# Patient Record
Sex: Female | Born: 1945 | Race: White | Hispanic: No | Marital: Married | Smoking: Former smoker
Health system: Southern US, Community
[De-identification: ages and names within clinical notes are randomized; demographics above are authoritative.]

## PROBLEM LIST (undated history)

## (undated) DIAGNOSIS — N189 Chronic kidney disease, unspecified: Secondary | ICD-10-CM

## (undated) DIAGNOSIS — M75102 Unspecified rotator cuff tear or rupture of left shoulder, not specified as traumatic: Secondary | ICD-10-CM

## (undated) DIAGNOSIS — M7542 Impingement syndrome of left shoulder: Secondary | ICD-10-CM

## (undated) DIAGNOSIS — M25812 Other specified joint disorders, left shoulder: Secondary | ICD-10-CM

## (undated) DIAGNOSIS — I214 Non-ST elevation (NSTEMI) myocardial infarction: Secondary | ICD-10-CM

## (undated) DIAGNOSIS — M1990 Unspecified osteoarthritis, unspecified site: Secondary | ICD-10-CM

## (undated) DIAGNOSIS — I4892 Unspecified atrial flutter: Secondary | ICD-10-CM

## (undated) DIAGNOSIS — J329 Chronic sinusitis, unspecified: Secondary | ICD-10-CM

## (undated) DIAGNOSIS — Z7901 Long term (current) use of anticoagulants: Secondary | ICD-10-CM

## (undated) DIAGNOSIS — E042 Nontoxic multinodular goiter: Secondary | ICD-10-CM

## (undated) DIAGNOSIS — Z95818 Presence of other cardiac implants and grafts: Secondary | ICD-10-CM

## (undated) DIAGNOSIS — E785 Hyperlipidemia, unspecified: Secondary | ICD-10-CM

## (undated) DIAGNOSIS — K649 Unspecified hemorrhoids: Secondary | ICD-10-CM

## (undated) DIAGNOSIS — I5030 Unspecified diastolic (congestive) heart failure: Secondary | ICD-10-CM

## (undated) DIAGNOSIS — E119 Type 2 diabetes mellitus without complications: Secondary | ICD-10-CM

## (undated) DIAGNOSIS — I499 Cardiac arrhythmia, unspecified: Secondary | ICD-10-CM

## (undated) DIAGNOSIS — Z8719 Personal history of other diseases of the digestive system: Secondary | ICD-10-CM

## (undated) DIAGNOSIS — K219 Gastro-esophageal reflux disease without esophagitis: Secondary | ICD-10-CM

## (undated) DIAGNOSIS — R002 Palpitations: Secondary | ICD-10-CM

## (undated) DIAGNOSIS — I10 Essential (primary) hypertension: Secondary | ICD-10-CM

## (undated) DIAGNOSIS — I480 Paroxysmal atrial fibrillation: Secondary | ICD-10-CM

## (undated) HISTORY — DX: Palpitations: R00.2

## (undated) HISTORY — DX: Unspecified atrial flutter: I48.92

## (undated) HISTORY — PX: CARDIAC CATHETERIZATION: SHX172

## (undated) HISTORY — DX: Gastro-esophageal reflux disease without esophagitis: K21.9

## (undated) HISTORY — DX: Unspecified osteoarthritis, unspecified site: M19.90

## (undated) HISTORY — DX: Nontoxic multinodular goiter: E04.2

## (undated) HISTORY — DX: Paroxysmal atrial fibrillation: I48.0

## (undated) HISTORY — PX: TONSILLECTOMY: SUR1361

## (undated) HISTORY — DX: Essential (primary) hypertension: I10

## (undated) HISTORY — DX: Hyperlipidemia, unspecified: E78.5

## (undated) HISTORY — PX: BIOPSY THYROID: PRO38

## (undated) HISTORY — DX: Unspecified diastolic (congestive) heart failure: I50.30

---
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
## 2001-05-18 ENCOUNTER — Ambulatory Visit (HOSPITAL_COMMUNITY): Admission: RE | Admit: 2001-05-18 | Discharge: 2001-05-18 | Payer: Self-pay | Admitting: Pulmonary Disease

## 2002-12-20 ENCOUNTER — Encounter (HOSPITAL_COMMUNITY): Admission: RE | Admit: 2002-12-20 | Discharge: 2003-01-19 | Payer: Self-pay | Admitting: Pulmonary Disease

## 2004-04-02 ENCOUNTER — Ambulatory Visit (HOSPITAL_COMMUNITY): Admission: RE | Admit: 2004-04-02 | Discharge: 2004-04-02 | Payer: Self-pay | Admitting: Pulmonary Disease

## 2004-08-10 ENCOUNTER — Ambulatory Visit (HOSPITAL_COMMUNITY): Admission: RE | Admit: 2004-08-10 | Discharge: 2004-08-10 | Payer: Self-pay | Admitting: Pulmonary Disease

## 2004-08-24 ENCOUNTER — Ambulatory Visit (HOSPITAL_COMMUNITY): Admission: RE | Admit: 2004-08-24 | Discharge: 2004-08-24 | Payer: Self-pay | Admitting: Pulmonary Disease

## 2005-07-24 ENCOUNTER — Ambulatory Visit (HOSPITAL_COMMUNITY): Admission: RE | Admit: 2005-07-24 | Discharge: 2005-07-24 | Payer: Self-pay | Admitting: Pulmonary Disease

## 2005-10-17 ENCOUNTER — Ambulatory Visit: Payer: Self-pay | Admitting: *Deleted

## 2005-10-18 ENCOUNTER — Encounter (INDEPENDENT_AMBULATORY_CARE_PROVIDER_SITE_OTHER): Payer: Self-pay | Admitting: *Deleted

## 2005-10-18 ENCOUNTER — Ambulatory Visit: Payer: Self-pay | Admitting: Endocrinology

## 2005-10-18 ENCOUNTER — Other Ambulatory Visit: Admission: RE | Admit: 2005-10-18 | Discharge: 2005-10-18 | Payer: Self-pay | Admitting: Endocrinology

## 2005-10-25 ENCOUNTER — Encounter (HOSPITAL_COMMUNITY): Admission: RE | Admit: 2005-10-25 | Discharge: 2005-11-24 | Payer: Self-pay | Admitting: *Deleted

## 2005-10-25 ENCOUNTER — Ambulatory Visit: Payer: Self-pay | Admitting: Cardiology

## 2005-10-30 ENCOUNTER — Ambulatory Visit: Payer: Self-pay | Admitting: *Deleted

## 2007-01-07 ENCOUNTER — Emergency Department (HOSPITAL_COMMUNITY): Admission: EM | Admit: 2007-01-07 | Discharge: 2007-01-08 | Payer: Self-pay | Admitting: Emergency Medicine

## 2007-01-09 ENCOUNTER — Ambulatory Visit: Payer: Self-pay | Admitting: Cardiology

## 2007-01-09 ENCOUNTER — Ambulatory Visit (HOSPITAL_COMMUNITY): Admission: RE | Admit: 2007-01-09 | Discharge: 2007-01-09 | Payer: Self-pay | Admitting: Pulmonary Disease

## 2007-01-21 ENCOUNTER — Ambulatory Visit: Payer: Self-pay | Admitting: Cardiovascular Disease

## 2007-03-10 ENCOUNTER — Encounter: Payer: Self-pay | Admitting: Endocrinology

## 2007-03-10 DIAGNOSIS — K219 Gastro-esophageal reflux disease without esophagitis: Secondary | ICD-10-CM

## 2007-03-10 DIAGNOSIS — I10 Essential (primary) hypertension: Secondary | ICD-10-CM | POA: Insufficient documentation

## 2009-02-14 ENCOUNTER — Ambulatory Visit (HOSPITAL_COMMUNITY): Admission: RE | Admit: 2009-02-14 | Discharge: 2009-02-14 | Payer: Self-pay | Admitting: Pulmonary Disease

## 2009-03-08 ENCOUNTER — Ambulatory Visit (HOSPITAL_COMMUNITY): Admission: RE | Admit: 2009-03-08 | Discharge: 2009-03-08 | Payer: Self-pay | Admitting: Pulmonary Disease

## 2009-03-20 ENCOUNTER — Ambulatory Visit: Payer: Self-pay | Admitting: Cardiology

## 2009-03-20 DIAGNOSIS — Z8679 Personal history of other diseases of the circulatory system: Secondary | ICD-10-CM | POA: Insufficient documentation

## 2009-03-20 DIAGNOSIS — R002 Palpitations: Secondary | ICD-10-CM | POA: Insufficient documentation

## 2009-03-23 ENCOUNTER — Ambulatory Visit: Payer: Self-pay | Admitting: Cardiology

## 2009-03-23 ENCOUNTER — Encounter (HOSPITAL_COMMUNITY): Admission: RE | Admit: 2009-03-23 | Discharge: 2009-03-30 | Payer: Self-pay | Admitting: Cardiology

## 2009-03-28 ENCOUNTER — Encounter (INDEPENDENT_AMBULATORY_CARE_PROVIDER_SITE_OTHER): Payer: Self-pay | Admitting: *Deleted

## 2009-04-10 ENCOUNTER — Telehealth: Payer: Self-pay | Admitting: Cardiology

## 2009-10-03 ENCOUNTER — Ambulatory Visit (HOSPITAL_COMMUNITY): Admission: RE | Admit: 2009-10-03 | Discharge: 2009-10-03 | Payer: Self-pay | Admitting: Pulmonary Disease

## 2009-12-18 ENCOUNTER — Ambulatory Visit (HOSPITAL_COMMUNITY): Admission: RE | Admit: 2009-12-18 | Discharge: 2009-12-18 | Payer: Self-pay | Admitting: Pulmonary Disease

## 2010-03-12 ENCOUNTER — Ambulatory Visit (HOSPITAL_COMMUNITY): Admission: RE | Admit: 2010-03-12 | Discharge: 2010-03-12 | Payer: Self-pay | Admitting: Pulmonary Disease

## 2010-06-18 ENCOUNTER — Ambulatory Visit (HOSPITAL_COMMUNITY)
Admission: RE | Admit: 2010-06-18 | Discharge: 2010-06-18 | Payer: Self-pay | Source: Home / Self Care | Attending: Pulmonary Disease | Admitting: Pulmonary Disease

## 2010-06-29 ENCOUNTER — Ambulatory Visit (HOSPITAL_COMMUNITY): Admission: RE | Admit: 2010-06-29 | Payer: Self-pay | Source: Home / Self Care | Admitting: Radiology

## 2010-06-29 ENCOUNTER — Ambulatory Visit (HOSPITAL_COMMUNITY)
Admission: RE | Admit: 2010-06-29 | Discharge: 2010-06-29 | Payer: Self-pay | Source: Home / Self Care | Attending: Pulmonary Disease | Admitting: Pulmonary Disease

## 2010-07-22 ENCOUNTER — Encounter: Payer: Self-pay | Admitting: Endocrinology

## 2010-07-23 ENCOUNTER — Encounter: Payer: Self-pay | Admitting: Pulmonary Disease

## 2010-08-30 ENCOUNTER — Ambulatory Visit: Payer: Self-pay | Admitting: Cardiology

## 2010-09-03 ENCOUNTER — Encounter: Payer: Self-pay | Admitting: Cardiology

## 2010-09-03 ENCOUNTER — Ambulatory Visit (INDEPENDENT_AMBULATORY_CARE_PROVIDER_SITE_OTHER): Payer: MEDICARE | Admitting: Cardiology

## 2010-09-03 DIAGNOSIS — I4891 Unspecified atrial fibrillation: Secondary | ICD-10-CM

## 2010-09-03 DIAGNOSIS — R002 Palpitations: Secondary | ICD-10-CM

## 2010-09-03 DIAGNOSIS — E042 Nontoxic multinodular goiter: Secondary | ICD-10-CM | POA: Insufficient documentation

## 2010-09-03 DIAGNOSIS — I480 Paroxysmal atrial fibrillation: Secondary | ICD-10-CM | POA: Insufficient documentation

## 2010-09-03 DIAGNOSIS — I10 Essential (primary) hypertension: Secondary | ICD-10-CM

## 2010-09-04 ENCOUNTER — Encounter: Payer: Self-pay | Admitting: Cardiology

## 2010-09-05 ENCOUNTER — Ambulatory Visit (HOSPITAL_COMMUNITY)
Admission: RE | Admit: 2010-09-05 | Discharge: 2010-09-05 | Disposition: A | Discharge status: Home / Self Care | Payer: Medicare Other | Source: Ambulatory Visit | Attending: Cardiology | Admitting: Cardiology

## 2010-09-05 ENCOUNTER — Encounter: Payer: Self-pay | Admitting: Cardiology

## 2010-09-05 DIAGNOSIS — I10 Essential (primary) hypertension: Secondary | ICD-10-CM | POA: Insufficient documentation

## 2010-09-05 DIAGNOSIS — R002 Palpitations: Secondary | ICD-10-CM | POA: Insufficient documentation

## 2010-09-05 DIAGNOSIS — I517 Cardiomegaly: Secondary | ICD-10-CM

## 2010-09-11 NOTE — Assessment & Plan Note (Signed)
Summary: np6 palp dr hawkins/tmj. CALLED FOR [facility] 08/31/10/TMJ   Visit Type:  Follow-up Primary Provider:  Dr. Kari Baars   History of Present Illness: 65 year old woman presents for cardiology followup. This is my first meeting with her. She was seen by Dr. Daleen Squibb in September 2010. Most recent cardiac testing is outlined below, including a reassuring Myoview from September 2010.  Record review also finds a prior episode of atrial fibrillation with rapid ventricular response back in 2008, spontaneously converted to sinus rhythm with diltiazem. She has a history of intermittent palpitations.  She is referred back to the office with a history of palpitations that occurred recently, awoke her at 4 AM, and described as a "fast" heartbeat. This lasted for approximately 30 minutes. No associated chest pain. At the same time she has been having trouble with her back, and having pain associated with it.  The last time she can recall having a similar episode was approximately 2 years ago. She reports no exertional chest pain. Her CHADS2 scores approximately 2.  Preventive Screening-Counseling & Management  Alcohol-Tobacco     Smoking Status: quit  Current Medications (verified): 1)  Hydrochlorothiazide 25 Mg  Tabs (Hydrochlorothiazide) .... Take 1 By Mouth Qd 2)  Lisinopril 10 Mg  Tabs (Lisinopril) .... Take 1 By Mouth Qd 3)  Prilosec 20 Mg  Cpdr (Omeprazole) .... Take 1 By Mouth Qd 4)  Simvastatin 40 Mg Tabs (Simvastatin) .... Take 1 Tab Daily 5)  Hydrocodone-Acetaminophen 7.5-500 Mg Tabs (Hydrocodone-Acetaminophen) .... Take As Neede For Pain 6)  Cvs Cinnamon 500 Mg Caps (Cinnamon) .... Take 2 Tabs Two Times A Day 7)  Fish Oil 1000 Mg Caps (Omega-3 Fatty Acids) .... Take 1 Tab Two Times A Day 8)  Aspir-Low 81 Mg Tbec (Aspirin) .... Take 1 Tab Daily  Allergies (verified): 1)  ! Sulfa 2)  ! * Ceflosporin  Comments:  Nurse/Medical Assistant: no meds no list we reviewed from meds from last  visit in 2010 [facility] [place]  Past History:  Family History: Last updated: 09/03/2010 Father: medical history not known Mother: history of stroke, died age 50  Social History: Last updated: 09/03/2010 Full Time - trucking business Married  Regular Exercise - no Drug Use - no Tobacco Use - Former, quit June 1995 Alcohol Use - no  Past Medical History: GERD Hypertension Dyslipidemia Multinodular Goiter Palpitations Atrial Fibrillation - 2008, spontaneously converted  Past Surgical History: Thyroid Biopsy  Tonsillectomy  Family History: Father: medical history not known Mother: history of stroke, died age 17  Social History: Full Time - trucking business Married  Regular Exercise - no Drug Use - no Tobacco Use - Former, quit June 1995 Alcohol Use - no Smoking Status:  quit  Review of Systems  The patient denies anorexia, fever, weight loss, chest pain, syncope, dyspnea on exertion, peripheral edema, prolonged cough, hemoptysis, melena, hematochezia, and severe indigestion/heartburn.         Chronic problems with arthritic pain, back pain. Otherwise reviewed and negative.  Vital Signs:  Patient profile:   65 year old female Weight:      177 pounds BMI:     29.56 Pulse rate:   61 / minute BP sitting:   144 / 82  (left arm)  Vitals Entered By: Dreama Saa, CNA (September 03, 2010 9:35 AM)  Physical Exam  Additional Exam:  Overweight woman in no acute distress. HEENT: Conjunctiva and lids normal, oropharynx with moist mucosa. Neck: Supple, no elevated JVP or carotid bruits.  Thyromegaly noted. Lungs: Clear to auscultation, nonlabored. Cardiac: Regular rate and rhythm, soft systolic murmur at the base, no pericardial rub. Abdomen: Soft, nontender, bowel sounds present. Skin: Warm and dry. Musculoskeletal: No kyphosis. Extremities: No pitting edema. Neuropsychiatric: Alert and oriented x3, affect appropriate.   Echocardiogram  Procedure date:   01/09/2007  Findings:       M-mode:  Aorta 2.7, left atrium 4.2, septum 1.6, posterior wall 1.2, LV   diastole 4.0, LV systole 2.5.   1. Technically, adequate echocardiographic study.   2. Left atrial size at the upper limit of normal; normal right atrium       and right ventricle.   3. Mild aortic valvular sclerosis with normal function.   4. Normal mitral valve; mild annular calcification; trivial       regurgitation.   5. Normal tricuspid valve, pulmonic valve and proximal pulmonary       artery.   6. Normal proximal ascending aorta.   7. Normal left ventricular size; mild septal hypertrophy; normal       regional and global function.   8. Normal IVC.  Nuclear Study  Procedure date:  03/23/2009  Findings:      Scintigraphic Data: Acquisition notable for very mild breast and   diaphragmatic attenuation.  Left ventricular size was normal.  On   tomographic images reconstructed in standard planes, there was   uniform and normal uptake of tracer all myocardial segments.  The   rest images were unchanged.  The gated reconstruction demonstrated   normal regional and global LV systolic function as well as normal   systolic accentuation of activity throughout.  Estimated ejection   fraction was 63%.    IMPRESSION:   Negative stress nuclear myocardial study revealing somewhat   impaired exercise capacity, no significant stress-induced EKG   abnormalities, normal left ventricular size, normal left   ventricular systolic function and normal myocardial perfusion.   Other findings as noted.  EKG  Procedure date:  09/03/2010  Findings:      Sinus bradycardia at 52 beats per minute.  Impression & Recommendations:  Problem # 1:  ATRIAL FIBRILLATION (ICD-427.31)  Based on record review, documented back in 2008. Recurrence pattern over time is not entirely clear, however this seems to be very infrequent. She has a CHADS2 score of approximately 2 based on age and hypertension. I  would like to obtain a seven-day cardiac monitor to get a better sense of her true heart rate variability since she is so bradycardic at baseline, and also ensure that she is not having asymptomatic episodes of atrial fibrillation. This will help [place] to further define a plan for anticoagulation going forward. We did discuss Coumadin today. A 2-D echocardiogram will also be obtained to reassess cardiac structure and function. Plan to bring her back the office to discuss the situation further.  Her updated medication list for this problem includes:    Aspir-low 81 Mg Tbec (Aspirin) .Marland Kitchen... Take 1 tab daily  Problem # 2:  PALPITATIONS (ICD-785.1)  Presumably atrial fibrillation, although not clearly documented at all times. Previous ischemic workup has been reassuring, including a Myoview from September 2010. No active chest pain symptoms.  Her updated medication list for this problem includes:    Lisinopril 10 Mg Tabs (Lisinopril) .Marland Kitchen... Take 1 by mouth qd    Aspir-low 81 Mg Tbec (Aspirin) .Marland Kitchen... Take 1 tab daily  Orders: Cardionet/Event Monitor (Cardionet/Event)  Problem # 3:  GOITER, MULTINODULAR (ICD-241.1)  Now followed by endocrinology.  Problem # 4:  HYPERTENSION (ICD-401.9)  No changes made to present regimen.  Her updated medication list for this problem includes:    Hydrochlorothiazide 25 Mg Tabs (Hydrochlorothiazide) .Marland Kitchen... Take 1 by mouth qd    Lisinopril 10 Mg Tabs (Lisinopril) .Marland Kitchen... Take 1 by mouth qd    Aspir-low 81 Mg Tbec (Aspirin) .Marland Kitchen... Take 1 tab daily  Orders: Cardionet/Event Monitor (Cardionet/Event)  Other Orders: 2-D Echocardiogram (2D Echo)  Patient Instructions: 1)  Your physician recommends that you schedule a follow-up appointment in: 1 month 2)  Your physician recommends that you continue on your current medications as directed. Please refer to the Current Medication list given to you today. 3)  Your physician has requested that you have an echocardiogram.   Echocardiography is a painless test that uses sound waves to create images of your heart. It provides your doctor with information about the size and shape of your heart and how well your heart's chambers and valves are working.  This procedure takes approximately one hour. There are no restrictions for this procedure. 4)  Your physician has recommended that you wear an event monitor.  Event monitors are medical devices that record the heart's electrical activity. Doctors most often use these monitors to diagnose arrhythmias. Arrhythmias are problems with the speed or rhythm of the heartbeat. The monitor is a small, portable device. You can wear one while you do your normal daily activities. This is usually used to diagnose what is causing palpitations/syncope (passing out).

## 2010-09-11 NOTE — Letter (Signed)
Summary: [place] [employer], agricultural at [facility]  [address], [place] [address]   Phone: [phone]  Fax: [phone]      September 05, 2010 MRN: [id number]   [facility] [address] [place], [place]  [address]   Dear Ms. Sillas,  Your test ordered by Selena Batten has been reviewed by your physician (or physician assistant) and was found to be normal or stable. Your physician (or physician assistant) felt no changes were needed at this time.  __X__ Echocardiogram  ____ Cardiac Stress Test  ____ Lab Work  ____ Peripheral vascular study of arms, legs or neck  ____ CT scan or X-ray  ____ Lung or Breathing test  ____ Other:  Please continue on current medical treatment.  Thank you.   Nona Dell, MD, F.A.C.C

## 2010-10-11 ENCOUNTER — Ambulatory Visit: Payer: MEDICARE | Admitting: Cardiology

## 2010-11-13 NOTE — Procedures (Signed)
NAMECAMELLE, HENKELS NO.:  [id number]   MEDICAL RECORD NO.:  [id number]          PATIENT TYPE:  OUT   LOCATION:  RAD                           FACILITY:  [facility]   PHYSICIAN:  Gerrit Friends. Dietrich Pates, MD, FACCDATE OF BIRTH:  [birth year]   DATE OF PROCEDURE:  01/09/2007  DATE OF DISCHARGE:                                ECHOCARDIOGRAM   CLINICAL DATA:  This is a 65 year old woman with atrial fibrillation.   M-mode:  Aorta 2.7, left atrium 4.2, septum 1.6, posterior wall 1.2, LV  diastole 4.0, LV systole 2.5.  1. Technically, adequate echocardiographic study.  2. Left atrial size at the upper limit of normal; normal right atrium      and right ventricle.  3. Mild aortic valvular sclerosis with normal function.  4. Normal mitral valve; mild annular calcification; trivial      regurgitation.  5. Normal tricuspid valve, pulmonic valve and proximal pulmonary      artery.  6. Normal proximal ascending aorta.  7. Normal left ventricular size; mild septal hypertrophy; normal      regional and global function.  8. Normal IVC.      Gerrit Friends. Dietrich Pates, MD, [facility]  Electronically Signed     RMR/MEDQ  D:  01/09/2007  T:  01/10/2007  Job:  [id number]

## 2010-11-13 NOTE — Assessment & Plan Note (Signed)
[facility]                       [place] CARDIOLOGY OFFICE NOTE   FE, OKUBO                        MRN:          [id number]  DATE:03/20/2009                            DOB:          Jun 13, 1946    HISTORY OF PRESENT ILLNESS:  I was asked by Dr. Kari Baars to  consult on Erika Lee, 65 year old single white female with dyspnea on  exertion and multiple cardiac risk factors.   She was evaluated here in 1997 with tachycardia and palpitations.  A  stress Myoview at that time demonstrated poor exercise tolerance,  exaggerated heart rate response to exercise, but no ischemia.   She has multiple risk factors including remote tobacco, age, sedentary  lifestyle, mild obesity, mixed hyperlipidemia, hypertension.   She currently denies any chest pain or angina.  She has no orthopnea,  PND, or peripheral edema.  She does have significant dyspnea on  exertion.  She quit smoking in 1995.   PAST MEDICAL HISTORY:  Her past medical history is significant for being  intolerant of doxycycline.   CURRENT MEDICATIONS:  1. Hydrochlorothiazide 25 mg per day.  2. Lisinopril 10 mg per day.  3. Prilosec 20 mg per day.  4. Simvastatin 40 mg a day.  5. Hydrocodone and acetaminophen as needed.  6. Cinnamon 2 tablets a day.  7. Fish oil 1000 mg two times a day.  8. Aspirin 81 mg per day.   ALLERGIES:  She also list allergies for SULFA and CEPHALOSPORIN.   SOCIAL HISTORY:  She is single.  She has 2 children.  She does not drink  alcohol.  She does not routinely exercise.   FAMILY HISTORY:  Unremarkable for premature coronary disease in first-  degree relatives.   REVIEW OF SYSTEMS:  Negative other than HPI.  All systems were  questioned.   PHYSICAL EXAMINATION:  VITAL SIGNS:  Blood pressure today is 129/82, her  pulse is 51, and she is in sinus brady.  EKG otherwise normal.  She is  65 inches, weighs 168 pounds.  BMI is 28.06.  GENERAL:  She is in  no acute distress.  She looks older than stated age.  HEENT:  Normal except sclerae are injected.  NECK:  Supple.  Carotid upstrokes were equal bilaterally without bruits.  Thyroid is palpable with symmetrical thyromegaly, but no tenderness or  bruit.  CHEST:  Lungs are clear to auscultation and percussion.  No rhonchi or  wheezes.  HEART:  A nondisplaced PMI, normal S1 and S2.  No murmur, rub, or  gallop.  ABDOMEN:  Soft, good bowel sounds.  No midline bruit.  No hepatomegaly.  EXTREMITIES:  No cyanosis, clubbing, or edema.  Pulses were brisk in  both dorsalis pedis and posterior tibial.  No sign of DVT.  NEUROLOGIC:  Intact.  SKIN:  Unremarkable.   ASSESSMENT:  Dyspnea on exertion with multiple cardiac risk factors,  rule out obstructive coronary artery disease.  I have explained this to  her today using heart model and coronary artery model and why we want to  rule out obstructive coronary disease.  If her  stress test is negative  with good LV function, we will continue with secondary prevention  strategies as appropriately being done by the patient and Dr. Juanetta Gosling.  If that is the case, I will see her back p.r.n.   I did advise her to try to walk 3 hours per week if possible.     Thomas C. Daleen Squibb, MD, [facility]  Electronically Signed    TCW/MedQ  DD: 03/20/2009  DT: 03/21/2009  Job #: [id number]   cc:   Ramon Dredge L. Juanetta Gosling, M.D.

## 2010-11-13 NOTE — Assessment & Plan Note (Signed)
[facility]                       [place] CARDIOLOGY OFFICE NOTE   Erika Lee, Erika Lee                        MRN:          [id number]  DATE:01/21/2007                            DOB:          03-10-1946    Erika Lee is seen today at the request of Dr. Juanetta Gosling.  She was  recently in the emergency room for palpitations.  She was found to be in  atrial fibrillation with a rapid ventricular response and we were asked  to evaluate this, both by Dr. Effie Shy and Dr. Juanetta Gosling.   The patient has a history of hypertension and atypical chest pain.   Her blood pressure has been under reasonable control.  She had a normal  nuclear study back in April 2007.   At that time, the ejection fraction was 60%.  Aside from her  hypertension and atypical chest pain, she has hyperlipidemia.  There is  no family history of coronary disease and she is a nonsmoker.   The patient tends to run a high sugar.  She does not think she has  diabetes; however, she takes cinnamon capsules to lower her sugar; she  seems to think that they are labile.   She has not been placed on any oral hypoglycemics.  The patient has had  occasional flip-flops for a long time, in particular at night when she  goes to bed she feels her heart skipping beats; however, these are  benign.  They are not associated with any symptoms and they do not  bother her too much.   However, when she went to the emergency room on July 11, she had rapid  palpitations with severe chest pain and shortness of breath; she said  she was quite symptomatic.  She was seen in the emergency room at around  8 o'clock.  She was given Cardizem.  She apparently converted  spontaneously and was discharged home by about 1 o'clock.  At the time,  her lab work was normal.  Her EKG did not show any acute changes.  Her  chest x-ray showed no active disease.  TSH and T4 were not drawn.   She subsequently had a followup echo on January 09, 2007 which showed  normal LV function with mild septal hypertrophy and no valve disease.   PMH: The patient does have a history of thyroid disease; she has had a  biopsy of it and appears to have a chronic nodule.  She said her TSH and  T4 were normal with Dr. Juanetta Gosling last year and she is due to have it  repeated.  She has not had any other stigmata of hyperthyroidism.   REVIEW OF SYSTEMS:  Otherwise negative.   CURRENT MEDICATIONS:  1. Lisinopril 10 mg a day.  2. Hydrochlorothiazide __________ a day.  3. Lipitor 40 mg a day.  4. Omeprazole 20 mg b.i.d.  5. Cinnamon capsules 1000 mg daily.   FAMILY HISTORY:  Noncontributory.   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  She  has  2 children.  Unfortunately, her daughter was killed by a gunshot  wound a few years back; this has been a source of depression and stress  for the patient.  Her son is doing well.  She does not smoke or drink.   ALLERGIES:  She has no known allergies.   PHYSICAL EXAMINATION:  Her exam is remarkable for a healthy-appearing  middle-aged white female in no acute distress.  Weight is 167.  She is afebrile.  Respiratory rate is 14.  Blood  pressure is 112/74.  Pulse is 54 and regular.  NECK:  Supple.  There is no JVP elevation and no bruits.  There is a  thyroid nodule, right greater than left.  There is no tenderness.  NECK:  Otherwise benign.  No lymphadenopathy.  LUNGS:  Clear with good diaphragmatic motion, no wheezing.  CARDIAC:  S1 and S2 with normal heart sounds.  PMI is normal.  ABDOMEN:  Benign.  Bowel sounds are positive.  No AAA.  No  hepatosplenomegaly and no hepatojugular reflux.  No tenderness.  Distal pulses are intact.  No edema.  NEUROLOGIC:  Nonfocal.  SKIN:  Warm and dry.  There is no muscular weakness.   Her baseline EKG is totally normal with sinus bradycardia, rate of 54.   IMPRESSION:   1. Isolated episode of paroxysmal atrial fibrillation.  At the time,      the patient apparently was on doxycycline and prednisone for a      spider biter and I am not sure if this is related to steroids, but      I have seen people have atrial fibrillation in the setting of      steroid treatment.  Regardless, I do not think the patient needs      additional medication or Coumadin at this time.  She will call me      if she has any recurrent palpitations and she will get a CardioNet      monitor and continue aspirin therapy for now.  2. History of thyroid abnormality.  She is to see Dr. Juanetta Gosling soon and      I asked her to get a TSH and T4 to make sure it remains normal, in      light of her atrial fibrillation.  There is no stigmata of      recurrent hypothyroidism or hyperthyroidism.  3. Hypertension, currently well-controlled.  Continue diuretic and      lisinopril.  4. Hyperlipidemia.  Continue Lipitor 40 mg a day.  Followup and lipid      and liver profile with Dr. Juanetta Gosling in August.  5. Reflux.  Continue omeprazole 20 mg b.i.d., low-spice diet.  6. Question of diabetes.  It may be worthwhile for the patient to have      a glucose tolerance test or a hemoglobin A1c.  For the time-being,      she will continue her cinnamon.  She said she read about this in a      book and feels that it helps keep her sugars from going up.  7. Anxiety, depression and grief particularly centered around the      murder of her daughter and raising of the grandson by an estranged      husband.  The patient will follow up with Dr. Juanetta Gosling.  She may      benefit from a prolonged course of Prozac.   I will see the patient back in 6 months.  She will  call us sooner if she  has any recurrent palpitations.  Again, I think the main issue here is  whether or not she should be on Coumadin; with a fairly normal  echocardiogram and a fairly young age with an isolated episode of atrial  fibrillation, I do not think  that this is warranted.  She will follow up  with Dr. Juanetta Gosling until her spider bite heals; currently, it seems to be  doing better.  I suspect it was a brown recluse spider bite, since it  has lasted so long and it was not painful when it happened.  The patient  has finished her course of doxycycline and is off her prednisone at this  time.     Noralyn Pick. Eden Emms, MD, [facility]  Electronically Signed    PCN/MedQ  DD: 01/21/2007  DT: 01/22/2007  Job #: [id number]

## 2010-11-16 NOTE — Procedures (Signed)
   NAME:  Erika Lee, Erika Lee NO.:  [id number]   MEDICAL RECORD NO.:  [id number]                   PATIENT TYPE:  PREC   LOCATION:                                       FACILITY:   PHYSICIAN:  Edward L. Juanetta Gosling, M.D.             DATE OF BIRTH:   DATE OF PROCEDURE:  12/20/2002  DATE OF DISCHARGE:                                    STRESS TEST   INDICATIONS FOR PROCEDURE:  [name] is undergoing graded exercise  testing because she has been having chest discomfort and leg swelling, and  she is undergoing stress test to be certain that she does not have ischemic  cardiac disease or congestive heart failure.  There are no contraindications  to graded exercise testing.   [name] exercised for four minutes 43 seconds on the Bruce protocol,  reaching and sustaining 7.0 METs.  She had Cardiolite injected per protocol.  Her maximum recorded heart rate was 138 which is 86% of her age-predicted  maximal heart rate.  She did not have any chest pain during exercise but she  did have shortness of breath and fatigue.  Blood pressure response to  exercise was normal and she had no other symptoms during exercise.  Electrocardiographically she showed some exaggeration of ST-T wave changes  inferiorly and laterally but no definite ischemic changes.   IMPRESSION:  1. Poor exercise tolerance.  2. Normal blood pressure response to exercise.  3. No definite evidence of inducible ischemia.  4. Shortness of breath and weakness related to exercise.   Cardiolite images are pending.                                               Edward L. Juanetta Gosling, M.D.    ELH/MEDQ  D:  12/20/2002  T:  12/20/2002  Job:  [id number]

## 2010-11-22 ENCOUNTER — Ambulatory Visit: Payer: Self-pay | Admitting: Cardiology

## 2010-12-05 DIAGNOSIS — R002 Palpitations: Secondary | ICD-10-CM

## 2010-12-13 ENCOUNTER — Encounter: Payer: Self-pay | Admitting: Cardiology

## 2010-12-13 ENCOUNTER — Ambulatory Visit (INDEPENDENT_AMBULATORY_CARE_PROVIDER_SITE_OTHER): Payer: Medicare Other | Admitting: Cardiology

## 2010-12-13 VITALS — BP 130/71 | HR 51 | Wt 173.0 lb

## 2010-12-13 DIAGNOSIS — R002 Palpitations: Secondary | ICD-10-CM

## 2010-12-13 DIAGNOSIS — I4891 Unspecified atrial fibrillation: Secondary | ICD-10-CM

## 2010-12-13 NOTE — Patient Instructions (Signed)
Your physician recommends that you continue on your current medications as directed. Please refer to the Current Medication list given to you today.  Your physician recommends that you schedule a follow-up appointment in: 6 months  

## 2010-12-13 NOTE — Progress Notes (Signed)
Clinical Summary Ms. Ju is a 65 y.o.female presenting for followup. I met her for the first time back in March. She states that she has been out of town for most of the time since our last visit, working in [place] with her husband. She did not wear the cardiac monitor ordered back in March, although obtained a [employer] monitor just recently, and completed a one-week observation. During this time she had sinus rhythm and sinus bradycardia with no significant arrhythmias. She reports no major problems with palpitations since our last visit, and is most comfortable of observation only at this time.  Followup echocardiogram is noted below.    Allergies  Allergen Reactions  . Sulfonamide Derivatives     Current outpatient prescriptions:aspirin 81 MG EC tablet, Take 81 mg by mouth daily.  , Disp: , Rfl: ;  Cinnamon 500 MG capsule, Take 1,000 mg by mouth 2 (two) times daily.  , Disp: , Rfl: ;  esomeprazole (NEXIUM) 40 MG capsule, Take 40 mg by mouth daily before breakfast.  , Disp: , Rfl: ;  hydrochlorothiazide 25 MG tablet, Take 25 mg by mouth daily.  , Disp: , Rfl:  HYDROcodone-acetaminophen (LORTAB) 7.5-500 MG per tablet, Take 1 tablet by mouth as needed. For pain , Disp: , Rfl: ;  lisinopril (PRINIVIL,ZESTRIL) 10 MG tablet, Take 10 mg by mouth daily.  , Disp: , Rfl: ;  Omega-3 Fatty Acids (FISH OIL) 1000 MG CAPS, Take 1,000 mg by mouth 2 (two) times daily.  , Disp: , Rfl: ;  Potassium 99 MG TABS, Take 1 tablet by mouth 2 (two) times daily.  , Disp: , Rfl:  simvastatin (ZOCOR) 40 MG tablet, Take 40 mg by mouth at bedtime.  , Disp: , Rfl: ;  Vitamins-Lipotropics (VIT BALANCED B-100 PO), Take 1 tablet by mouth daily.  , Disp: , Rfl: ;  DISCONTD: omeprazole (PRILOSEC) 20 MG capsule, Take 20 mg by mouth daily.  , Disp: , Rfl:   Past Medical History  Diagnosis Date  . Hyperlipidemia   . Multinodular goiter   . Atrial fibrillation     Documented 2008, spontaneously converted  . GERD  (gastroesophageal reflux disease)   . Essential hypertension, benign   . Palpitations     Social History Ms. Deeg reports that she quit smoking about 17 years ago. Her smoking use included Cigarettes. She has never used smokeless tobacco. Ms. Ciolek reports that she does not drink alcohol.  Review of Systems Otherwise negative.  Physical Examination Filed Vitals:   12/13/10 1328  BP: 130/71  Pulse: 51   Additional Exam:  Overweight woman in no acute distress.   HEENT: Conjunctiva and lids normal, oropharynx with moist mucosa.   Neck: Supple, no elevated JVP or carotid bruits. Thyromegaly noted.   Lungs: Clear to auscultation, nonlabored.   Cardiac: Regular rate and rhythm, soft systolic murmur at the base, no pericardial rub.   Abdomen: Soft, nontender, bowel sounds present.   Skin: Warm and dry.   Musculoskeletal: No kyphosis.   Extremities: No pitting edema.   Neuropsychiatric: Alert and oriented x3, affect appropriate.    Studies Echocardiogram 09/05/2010: - Left ventricle: The cavity size was normal. Wall thickness was     increased in a pattern of mild LVH. Systolic function was normal.     The estimated ejection fraction was in the range of 55% to 60%.     Wall motion was normal; there were no regional wall motion     abnormalities.   -  Left atrium: The atrium was mildly dilated.   - Atrial septum: No defect or patent foramen ovale was identified.  Problem List and Plan

## 2010-12-13 NOTE — Assessment & Plan Note (Signed)
Not recurrent since last visit. One week cardiac monitoring demonstrated no significant arrhythmias. Patient remains most comfortable with observation at this time. Certainly if her symptoms progress, we can attempt another period of monitoring to exclude any obvious dysrhythmia.

## 2010-12-13 NOTE — Assessment & Plan Note (Signed)
Documented in 2008, however no clear dysrhythmia since that time. For now she is staying on aspirin.

## 2010-12-14 ENCOUNTER — Ambulatory Visit: Payer: Self-pay | Admitting: Cardiology

## 2010-12-17 ENCOUNTER — Encounter: Payer: Self-pay | Admitting: Cardiology

## 2011-03-26 ENCOUNTER — Other Ambulatory Visit (HOSPITAL_COMMUNITY): Payer: Self-pay | Admitting: Pulmonary Disease

## 2011-03-26 DIAGNOSIS — Z139 Encounter for screening, unspecified: Secondary | ICD-10-CM

## 2011-04-04 ENCOUNTER — Ambulatory Visit (HOSPITAL_COMMUNITY)
Admission: RE | Admit: 2011-04-04 | Discharge: 2011-04-04 | Disposition: A | Discharge status: Home / Self Care | Payer: Medicare Other | Source: Ambulatory Visit | Attending: Pulmonary Disease | Admitting: Pulmonary Disease

## 2011-04-04 DIAGNOSIS — Z1231 Encounter for screening mammogram for malignant neoplasm of breast: Secondary | ICD-10-CM | POA: Insufficient documentation

## 2011-04-04 DIAGNOSIS — Z139 Encounter for screening, unspecified: Secondary | ICD-10-CM

## 2011-04-16 LAB — POCT CARDIAC MARKERS
Operator id: 217071
Troponin i, poc: <0.05

## 2011-04-16 LAB — COMPREHENSIVE METABOLIC PANEL
BUN: 21
CO2: 23
Calcium: 9.3
Creatinine, Ser: 0.9
GFR calc Af Amer: >60
GFR calc non Af Amer: >60
Glucose, Bld: 246 — ABNORMAL HIGH

## 2011-04-16 LAB — CBC
Hemoglobin: 14.5
MCHC: 34.3
MCV: 87.5
RBC: 4.84

## 2011-04-16 LAB — DIFFERENTIAL
Lymphocytes Relative: 23
Lymphs Abs: 2.5
Neutrophils Relative %: 70

## 2011-04-16 LAB — URINALYSIS, ROUTINE W REFLEX MICROSCOPIC
Bilirubin Urine: NEGATIVE
Ketones, ur: NEGATIVE
Nitrite: NEGATIVE
pH: 6

## 2011-04-16 LAB — APTT: aPTT: 27

## 2011-04-16 LAB — D-DIMER, QUANTITATIVE: D-Dimer, Quant: 0.22

## 2011-04-16 LAB — PROTIME-INR: Prothrombin Time: 12.6

## 2011-09-09 MED ORDER — SODIUM CHLORIDE 0.45 % IV SOLN
Freq: Once | INTRAVENOUS | Status: AC
  Administered 2011-09-10: 1000 mL via INTRAVENOUS

## 2011-09-10 ENCOUNTER — Encounter (HOSPITAL_COMMUNITY): Payer: Self-pay | Admitting: *Deleted

## 2011-09-10 ENCOUNTER — Ambulatory Visit (HOSPITAL_COMMUNITY)
Admission: RE | Admit: 2011-09-10 | Discharge: 2011-09-10 | Disposition: A | Discharge status: Home / Self Care | Payer: Medicare Other | Source: Ambulatory Visit | Attending: General Surgery | Admitting: General Surgery

## 2011-09-10 ENCOUNTER — Encounter (HOSPITAL_COMMUNITY)
Admission: RE | Disposition: A | Discharge status: Home / Self Care | Payer: Self-pay | Source: Ambulatory Visit | Attending: General Surgery

## 2011-09-10 DIAGNOSIS — Z1211 Encounter for screening for malignant neoplasm of colon: Secondary | ICD-10-CM | POA: Insufficient documentation

## 2011-09-10 HISTORY — DX: Unspecified osteoarthritis, unspecified site: M19.90

## 2011-09-10 HISTORY — PX: COLONOSCOPY: SHX5424

## 2011-09-10 SURGERY — COLONOSCOPY
Anesthesia: Moderate Sedation

## 2011-09-10 MED ORDER — MIDAZOLAM HCL 5 MG/5ML IJ SOLN
INTRAMUSCULAR | Status: DC | PRN
  Administered 2011-09-10: 2 mg via INTRAVENOUS
  Administered 2011-09-10 (×2): 1 mg via INTRAVENOUS

## 2011-09-10 MED ORDER — MEPERIDINE HCL 25 MG/ML IJ SOLN
INTRAMUSCULAR | Status: DC | PRN
  Administered 2011-09-10: 50 mg via INTRAVENOUS

## 2011-09-10 MED ORDER — MIDAZOLAM HCL 5 MG/5ML IJ SOLN
INTRAMUSCULAR | Status: AC
  Filled 2011-09-10: qty 5

## 2011-09-10 MED ORDER — STERILE WATER FOR IRRIGATION IR SOLN
Status: DC | PRN
  Administered 2011-09-10: 09:00:00

## 2011-09-10 MED ORDER — MEPERIDINE HCL 50 MG/ML IJ SOLN
INTRAMUSCULAR | Status: AC
  Filled 2011-09-10: qty 1

## 2011-09-10 NOTE — H&P (Signed)
  NTS SOAP Note  Vital Signs:  Vitals as of: 09/03/2011: Systolic 134: Diastolic 74: Heart Rate 57: Temp 96.61F: Height 34ft 5in: Weight 181Lbs 0 Ounces: OFC 0in: Respiratory Rate 0: O2 Saturation 0: Pain Level 0: BMI 30  BMI : 30.12 kg/m2  Subjective: This 66 Years 80 Months old Female presents for of bleeding hemorrhoids.  Patient has had increased episodes of hemorrhoidal disease.  Prior history of steroidal ointment but none recently.  Has not increased fiber in diet.  Drinks fairly decent amount of water daily.  No history of constipation.  Review of Symptoms:  Constitutional:unremarkable Head:unremarkable Eyes:unremarkable Nose/Mouth/Throat:unremarkable Cardiovascular:unremarkable Respiratory:unremarkable Gastrointestinal:unremarkableas per HPI Genitourinary:unremarkable Musculoskeletal:unremarkable Skin:unremarkable Breast:unremarkable Hematolgic/Lymphatic:unremarkable Allergic/Immunologic:unremarkable   Past Medical History:Reviewed   Past Medical History  Pregnancy Gravida: 2 Pregnancy Para: 2   Social History:Reviewed   Social History  Preferred Language: English ([place]) Race:  White Ethnicity: Not Hispanic / Latino Age: 66 Years 9 Months Marital Status:  S Alcohol: no Recreational drug(s): no   Smoking Status: Never smoker reviewed on 09/09/2011  Family History:Reviewed   Family History  Is there a family history [id number]    Objective Information: General:Well appearing, well nourished in no distress. Skin:no rash or prominent lesions Head:Atraumatic; no masses; no abnormalities Eyes:conjunctiva clear, EOM intact, PERRL Mouth:Mucous membranes moist, no mucosal lesions. Neck:Supple without lymphadenopathy.  Heart:RRR, no murmur Lungs:CTA bilaterally, no wheezes, rhonchi, rales.  Breathing unlabored. Abdomen:Soft, NT/ND, no HSM, no masses.   notable  hemorrhoidal tissue with mild prolaspe.  Mild tenderness.    Assessment:  Diagnosis &amp; Procedure: DiagnosisCode: 455.3, ProcedureCode: 47829,    Plan: Hemorrhoidal dilation. Will get TCS to assess colon. Patient Education:Alternative treatments to surgery were discussed with patient (and family).Risks and benefits  of procedure were fully explained to the patient (and family) who gave informed consent. Patient/family questions were addressed.  Follow-up:Months 2

## 2011-09-10 NOTE — Interval H&P Note (Signed)
History and Physical Interval Note:  09/10/2011 9:23 AM  Erika Lee  has presented today for surgery, with the diagnosis of Special screening for malignant neoplasms, colon   The various methods of treatment have been discussed with the patient and family. After consideration of risks, benefits and other options for treatment, the patient has consented to  Procedure(s) (LRB): COLONOSCOPY (N/A) as a surgical intervention .  The patients' history has been reviewed, patient examined, no change in status, stable for surgery.  I have reviewed the patients' chart and labs.  Questions were answered to the patient's satisfaction.     Franky Macho A

## 2011-09-10 NOTE — Op Note (Signed)
[facility] [address] [place], [place]  [address]  COLONOSCOPY PROCEDURE REPORT  PATIENT:  Erika Lee, Erika Lee  MR#:  [id number] BIRTHDATE:  1945/09/09, 65 yrs. old  GENDER:  female ENDOSCOPIST:  Franky Macho, MD REF. BY:  Kari Baars, M.D. PROCEDURE DATE:  09/10/2011 PROCEDURE:  Average-risk screening colonoscopy G0121 ASA CLASS:  Class II INDICATIONS:  Screening MEDICATIONS:   Versed 4 mg IV, demerol 50 mg IV  DESCRIPTION OF PROCEDURE:   After the risks benefits and alternatives of the procedure were thoroughly explained, informed consent was obtained.  Digital rectal exam was performed and revealed external hemorrhoids.   The [id number] ([id number]) endoscope was introduced through the anus and advanced to the cecum, which was identified by both the appendix and ileocecal valve, without limitations.  The quality of the prep was adequate..  The instrument was then slowly withdrawn as the colon was fully examined.FINDINGS:  A normal appearing cecum, ileocecal valve, and appendiceal orifice were identified. The ascending, hepatic flexure, transverse, splenic flexure, descending, sigmoid colon, and rectum appeared unremarkable.   Retroflexed views in the rectum rev ealed no abnormalities.   The scope was then withdrawn from the cecum and the procedure completed. COMPLICATIONS:  None ENDOSCOPIC IMPRESSION: 1) Normal colon RECOMMENDATIONS:  REPEAT EXAM:  In 10 year(s) for Colonoscopy.  ______________________________ Franky Macho, MD  CC:  Shaune Pollack, MD  n. Rosalie DoctorFranky Macho at 09/10/2011 09:42 AM  Autumn Patty, [phone]

## 2011-09-10 NOTE — Discharge Instructions (Signed)
Colonoscopy Care After Read the instructions outlined below and refer to this sheet in the next few weeks. These discharge instructions provide you with general information on caring for yourself after you leave the hospital. Your doctor may also give you specific instructions. While your treatment has been planned according to the most current medical practices available, unavoidable complications occasionally occur. If you have any problems or questions after discharge, call your doctor. HOME CARE INSTRUCTIONS ACTIVITY:  You may resume your regular activity, but move at a slower pace for the next 24 hours.   Take frequent rest periods for the next 24 hours.   Walking will help get rid of the air and reduce the bloated feeling in your belly (abdomen).   No driving for 24 hours (because of the medicine (anesthesia) used during the test).   You may shower.   Do not sign any important legal documents or operate any machinery for 24 hours (because of the anesthesia used during the test).  NUTRITION:  Drink plenty of fluids.   You may resume your normal diet as instructed by your doctor.   Begin with a light meal and progress to your normal diet. Heavy or fried foods are harder to digest and may make you feel sick to your stomach (nauseated).   Avoid alcoholic beverages for 24 hours or as instructed.  MEDICATIONS:  You may resume your normal medications unless your doctor tells you otherwise.  WHAT TO EXPECT TODAY:  Some feelings of bloating in the abdomen.   Passage of more gas than usual.   Spotting of blood in your stool or on the toilet paper.  IF YOU HAD POLYPS REMOVED DURING THE COLONOSCOPY:  No aspirin products for 7 days or as instructed.   No alcohol for 7 days or as instructed.   Eat a soft diet for the next 24 hours.  FINDING OUT THE RESULTS OF YOUR TEST Not all test results are available during your visit. If your test results are not back during the visit, make an  appointment with your caregiver to find out the results. Do not assume everything is normal if you have not heard from your caregiver or the medical facility. It is important for you to follow up on all of your test results.  SEEK IMMEDIATE MEDICAL CARE IF:  You have more than a spotting of blood in your stool.   Your belly is swollen (abdominal distention).   You are nauseated or vomiting.   You have a fever.   You have abdominal pain or discomfort that is severe or gets worse throughout the day.  Document Released: 01/30/2004 Document Revised: 06/06/2011 Document Reviewed: 01/28/2008 Exit[employer] Patient Information 2012 [employer]. 

## 2011-09-13 ENCOUNTER — Encounter (HOSPITAL_COMMUNITY): Payer: Self-pay | Admitting: General Surgery

## 2011-10-21 ENCOUNTER — Encounter: Payer: Self-pay | Admitting: Vascular Surgery

## 2011-10-22 ENCOUNTER — Ambulatory Visit (INDEPENDENT_AMBULATORY_CARE_PROVIDER_SITE_OTHER): Payer: Medicare Other | Admitting: *Deleted

## 2011-10-22 ENCOUNTER — Encounter (INDEPENDENT_AMBULATORY_CARE_PROVIDER_SITE_OTHER): Payer: Medicare Other | Admitting: Vascular Surgery

## 2011-10-22 ENCOUNTER — Encounter: Payer: Self-pay | Admitting: Vascular Surgery

## 2011-10-22 ENCOUNTER — Ambulatory Visit (INDEPENDENT_AMBULATORY_CARE_PROVIDER_SITE_OTHER): Payer: Medicare Other | Admitting: Vascular Surgery

## 2011-10-22 VITALS — BP 157/83 | HR 66 | Resp 18 | Ht 65.0 in | Wt 181.5 lb

## 2011-10-22 DIAGNOSIS — M7989 Other specified soft tissue disorders: Secondary | ICD-10-CM

## 2011-10-22 DIAGNOSIS — R609 Edema, unspecified: Secondary | ICD-10-CM

## 2011-10-22 DIAGNOSIS — M79609 Pain in unspecified limb: Secondary | ICD-10-CM

## 2011-10-22 NOTE — Progress Notes (Signed)
The patient has today for evaluation of lower extremity venous pathology. She is a very pleasant 66 year old female with a standing history of ulceration over her right thigh. This been present over the course of 3-4 years she has had 3 separate issues one in her proximal medial thigh at above-knee medial thigh and most recently in the area just below her knee on the medial aspect. These take another long period of time to heal and initially presented as a red punctate area that spread. She's had no similar rashes on the left leg. Then propose these could be spider bites versus venous ulceration. She does have some chronic lower surety swelling. She also has marked telangiectasia exertion these and has had bleeding from a most particularly the left lateral calf but also has had bleeding in one or 2 times in her right medial calf. These have been controlled with direct pressure.  Past Medical History  Diagnosis Date  . Hyperlipidemia   . Multinodular goiter   . Atrial fibrillation     Documented 2008, spontaneously converted  . GERD (gastroesophageal reflux disease)   . Essential hypertension, benign   . Palpitations   . Diabetes mellitus     borderline  . Arthritis     History  Substance Use Topics  . Smoking status: Former Smoker    Types: Cigarettes    Quit date: 11/29/1993  . Smokeless tobacco: Never Used  . Alcohol Use: No    Family History  Problem Relation Age of Onset  . Stroke Mother     Allergies  Allergen Reactions  . Doxycycline Other (See Comments)    Makes heart race  . Sulfonamide Derivatives     Current outpatient prescriptions:aspirin 81 MG EC tablet, Take 81 mg by mouth daily.  , Disp: , Rfl: ;  Cinnamon 500 MG capsule, Take 1,000 mg by mouth 2 (two) times daily.  , Disp: , Rfl: ;  esomeprazole (NEXIUM) 40 MG capsule, Take 40 mg by mouth daily before breakfast.  , Disp: , Rfl: ;  hydrochlorothiazide 25 MG tablet, Take 25 mg by mouth daily.  , Disp: , Rfl:    HYDROcodone-acetaminophen (LORTAB) 7.5-500 MG per tablet, Take 1 tablet by mouth as needed. For pain , Disp: , Rfl: ;  lisinopril (PRINIVIL,ZESTRIL) 10 MG tablet, Take 10 mg by mouth daily.  , Disp: , Rfl: ;  Omega-3 Fatty Acids (FISH OIL) 1000 MG CAPS, Take 1,000 mg by mouth 2 (two) times daily.  , Disp: , Rfl: ;  POTASSIUM AMINOBENZOATE PO, Take 550 mg by mouth 2 (two) times daily., Disp: , Rfl:  simvastatin (ZOCOR) 40 MG tablet, Take 40 mg by mouth at bedtime.  , Disp: , Rfl: ;  Vitamins-Lipotropics (VIT BALANCED B-100 PO), Take 1 tablet by mouth daily.  , Disp: , Rfl:   BP 157/83  Pulse 66  Resp 18  Ht 5\' 5"  (1.651 m)  Wt 181 lb 8 oz (82.328 kg)  BMI 30.20 kg/m2  Body mass index is 30.20 kg/(m^2).       Review of systems positive for swelling in her legs and venous ulcerations otherwise negative  Physical exam: Well-developed well-nourished white female no acute distress 2+ radial and 2+ dorsalis pedis pulses bilaterally. He does have scattered telangiectasia or both legs and these are more pronounced and somewhat raised above the skin on the left lateral calf. She does have healed scars were these ulcers were present on her right medial thigh and proximal calf.  Vascular  lab studies at for a wound center revealing arm index normal at 2.0 bilateral  Venous duplex today in our office reveal somewhat dilated saphenous vein but no significant reflux in her saphenous veins bilaterally. She does have reflux in her deep systems bilaterally  I impression and plan: Had a long discussion with the patient and her husband present. Enough of these ulcerations are venous stasis ulcers. There is not in the appropriate location and she does not ever at have any other area of hemosiderin deposits at her ankles. I did explain that the recurrent bleeding from her trauma telangiectasias related to venous hypertension which appears to be related to deep venous system. I have recommended the option of  sclerotherapy for treatment of the areas that have bled to reduce her risk for bleeding. I explained that there are no other for treatment since her saphenous veins not appear to have reflux by duplex. She understands and we'll schedule sclerotherapy at her convenience

## 2011-10-24 NOTE — Procedures (Unsigned)
LOWER EXTREMITY VENOUS REFLUX EXAM  INDICATION:  Bilateral lower extremity edema.  EXAM:  Using color-flow imaging and pulse Doppler spectral analysis, the bilateral common femoral, femoral, popliteal, posterior tibial, greater and lesser saphenous veins are evaluated.  There is evidence suggesting deep venous insufficiency in the bilateral lower extremities.  The bilateral saphenofemoral junction is not competent with Reflux of >500 milliseconds. The bilateral GSV mildly incompetent with Reflux of >500 milliseconds with the caliber as described below.   The bilateral proximal short saphenous veins demonstrate competency.  GSV Diameter (used if found to be incompetent only)                                           Right    Left Proximal Greater Saphenous Vein           1.0 cm   0.92 cm Proximal-to-mid-thigh                     cm       cm Mid thigh                                 cm       cm Mid-distal thigh                          cm       cm Distal thigh                              cm       cm Knee                                      cm       0.39 cm   IMPRESSION: 1. The bilateral greater saphenous veins are mildly incompetent     proximally with reflux >500 milliseconds. 2. The bilateral greater saphenous veins are not tortuous. 3. The deep venous system bilaterally is not competent with Reflux of     >500 milliseconds. 4. The bilateral lesser saphenous veins are competent. 5. No evidence of deep venous thrombosis bilaterally.        ___________________________________________ Larina Earthly, M.D.  SS/MEDQ  D:  10/22/2011  T:  10/22/2011  Job:  [id number]

## 2011-11-26 ENCOUNTER — Encounter: Payer: Self-pay | Admitting: *Deleted

## 2011-11-27 ENCOUNTER — Encounter: Payer: Self-pay | Admitting: *Deleted

## 2011-11-27 ENCOUNTER — Ambulatory Visit (INDEPENDENT_AMBULATORY_CARE_PROVIDER_SITE_OTHER): Payer: Medicare Other | Admitting: *Deleted

## 2011-11-27 DIAGNOSIS — I781 Nevus, non-neoplastic: Secondary | ICD-10-CM

## 2011-11-27 NOTE — Progress Notes (Signed)
X=.3% Sotradecol administered with a 27g butterfly.  Patient received a total of 6cc.  Treated all areas of concern (not all areas). Easy access. Tol well. Will follow prn.  Photos: yes  Compression stockings applied: yes

## 2011-11-28 ENCOUNTER — Telehealth: Payer: Self-pay | Admitting: *Deleted

## 2011-11-28 ENCOUNTER — Encounter: Payer: Self-pay | Admitting: Vascular Surgery

## 2011-11-28 NOTE — Telephone Encounter (Signed)
Erika Lee was in [facility] office 11-27-2011 for sclerotherapy treatment and left post sclerotherapy care instruction sheet in office.  Reviewed all home care instructions with patient and mailed a copy of the instructions to her this morning (11-28-2011).  , [name]

## 2012-11-24 ENCOUNTER — Other Ambulatory Visit (HOSPITAL_COMMUNITY): Payer: Self-pay | Admitting: Pulmonary Disease

## 2012-11-24 DIAGNOSIS — Z139 Encounter for screening, unspecified: Secondary | ICD-10-CM

## 2012-11-30 ENCOUNTER — Ambulatory Visit (HOSPITAL_COMMUNITY)
Admission: RE | Admit: 2012-11-30 | Discharge: 2012-11-30 | Disposition: A | Discharge status: Home / Self Care | Payer: Medicare Other | Source: Ambulatory Visit | Attending: Pulmonary Disease | Admitting: Pulmonary Disease

## 2012-11-30 DIAGNOSIS — Z139 Encounter for screening, unspecified: Secondary | ICD-10-CM

## 2012-11-30 DIAGNOSIS — E559 Vitamin D deficiency, unspecified: Secondary | ICD-10-CM | POA: Insufficient documentation

## 2012-11-30 DIAGNOSIS — Z780 Asymptomatic menopausal state: Secondary | ICD-10-CM | POA: Insufficient documentation

## 2013-04-13 ENCOUNTER — Encounter (HOSPITAL_COMMUNITY): Payer: Self-pay | Admitting: Pharmacy Technician

## 2013-04-13 NOTE — Patient Instructions (Signed)
MARITSA HUNSUCKER  04/13/2013   Your procedure is scheduled on:  04/19/13  Report to [facility] at 10:00 AM.  Call this number if you have problems the morning of surgery: [phone]   Remember:   Do not eat food or drink liquids after midnight.   Take these medicines the morning of surgery with A SIP OF WATER: Nexium and Lisinopril. You make take your Hydrocodone if needed.   Do not wear jewelry, make-up or nail polish.  Do not wear lotions, powders, or perfumes.   Do not shave 48 hours prior to surgery. Men may shave face and neck.  Do not bring valuables to the hospital.  [facility] is not responsible for any belongings or valuables.               Contacts, dentures or bridgework may not be worn into surgery.  Leave suitcase in the car. After surgery it may be brought to your room.  For patients admitted to the hospital, discharge time is determined by your treatment team.               Patients discharged the day of surgery will not be allowed to drive home.   Special Instructions: Shower using CHG 2 nights before surgery and the night before surgery.  If you shower the day of surgery use CHG.  Use special wash - you have one bottle of CHG for all showers.  You should use approximately 1/3 of the bottle for each shower.   Please read over the following fact sheets that you were given: Pain Booklet, Surgical Site Infection Prevention, Anesthesia Post-op Instructions and Care and Recovery After Surgery    Excision of Skin Lesions Excision of a skin lesion refers to the removal of a section of skin by making small cuts (incisions) in the skin. This is typically done to remove a cancerous growth (basal cell carcinoma, squamous cell carcinoma, or melanoma) or a noncancerous growth (cyst). It may be done to treat or prevent cancer or infection. It may also be done to improve cosmetic appearance (removal of mole, skin tag). LET YOUR CAREGIVER KNOW ABOUT:   Allergies to food or  medicine.  Medicines taken, including vitamins, herbs, eyedrops, over-the-counter medicines, and creams.  Use of steroids (by mouth or creams).  Previous problems with anesthetics or numbing medicines.  History of bleeding problems or blood clots.  History of any prostheses.  Previous surgery.  Other health problems, including diabetes and kidney problems.  Possibility of pregnancy, if this applies. RISKS AND COMPLICATIONS  Many complications can be managed. With appropriate treatment and rehabilitation, the following complications are very uncommon:  Bleeding.  Infection.  Scarring.  Recurrence of cyst or cancer.  Changes in skin sensation or appearance (discoloration, swelling).  Reaction to anesthesia.  Allergic reaction to surgical materials or ointments.  Damage to nerves, blood vessels, muscles, or other structures.  Continued pain. BEFORE THE PROCEDURE  It is important to follow your caregiver's instructions prior to your procedure to avoid complications. Steps before your procedure may include:  Physical exam, blood tests, other procedures, such as removing a small sample for examination under a microscope (biopsy).  Your caregiver may review the procedure, the anesthesia being used, and what to expect after the procedure with you. You may be asked to:  Stop taking certain medicines, such as blood thinners (including aspirin, clopidogrel, ibuprofen), for several days prior to your procedure.  Take certain medicines.  Stop smoking. It is a  good idea to arrange for a ride home after surgery and to have someone to help you with activities during recovery. PROCEDURE  There are several excision techniques. The type of excision or surgical technique used will depend on your condition, the location of the lesion, and your overall health. After the lesion is sterilized and a local anesthetic is applied, the following may be performed: Complete surgical  excision The area to be removed is marked with a pen. Using a small scalpel and scissors, the surgeon gently cuts around and under the lesion until it is completely removed. The lesion is placed in a special fluid and sent to the lab for examination. If necessary, bleeding will be controlled with a device that delivers heat. The edges of the wound are stitched together and a dressing is applied. This procedure may be performed to treat a cancerous growth or noncancerous cyst or lesion. Surgeons commonly perform an elliptical excision, to minimize scarring. Excision of a cyst The surgeon makes an incision on the cyst. The entire cyst is removed through the incision. The wound may be closed with a suture (stitch). Shave excision During shave excision, the surgeon uses a small blade or loop instrument to shave off the lesion. This may be done to remove a mole or skin tag. The wound is usually left to heal on its own without stitches. Punch excision During punch excision, the surgeon uses a small, round tool (like a cookie cutter) to cut a circle shape out of the skin. The outer edges of the skin are stitched together. This may be done to remove a mole or scar or to perform a biopsy of the lesion. Mohs micrographic surgery During Mohs micrographic surgery, layers of the lesion are removed with a scalpel or loop instrument and immediately examined under a microscope until all of the abnormal or cancerous tissue is removed. This procedure is minimally invasive and ensures the best cosmetic outcome, with removal of as little normal tissue as possible. Mohs is usually done to treat skin cancer, such as basal cell carcinoma or squamous cell carcinoma, particularly on the face and ears. Antibiotic ointment is applied to the surgical area after each of the procedures listed above, as necessary. AFTER THE PROCEDURE  How well you heal depends on many factors. Most patients heal quite well with proper techniques and  self-care. Scarring will lessen over time. HOME CARE INSTRUCTIONS   Take medicines for pain as directed.  Keep the incision area clean, dry, and protected for at least 48 hours. Change dressings as directed.  For bleeding, apply gentle but firm pressure to the wound using a folded towel for 20 minutes. Call your caregiver if bleeding does not stop.  Avoid high-impact exercise and activities until the stitches are removed or the area heals.  Follow your caregiver's instructions to minimize scarring. Avoid sun exposure until the area has healed. Scarring should lessen over time.  Follow up with your caregiver as directed. Removal of stitches within 4 to 14 days may be necessary. Finding out the results of your test Not all test results are available during your visit. If your test results are not back during the visit, make an appointment with your caregiver to find out the results. Do not assume everything is normal if you have not heard from your caregiver or the medical facility. It is important for you to follow up on all of your test results. SEEK MEDICAL CARE IF:   You or your child has  an oral temperature above 102 F (38.9 C).  You develop signs of infection (chills, feeling unwell).  You notice bleeding, pain, discharge, redness, or swelling at the incision site.  You notice skin irregularities or changes in sensation. MAKE SURE YOU:   Understand these instructions.  Will watch your condition.  Will get help right away if you are not doing well or get worse. FOR MORE INFORMATION  American Academy [employer]: www.https://powers.com/ American Academy of Dermatology: InfoExam.si Document Released: 09/11/2009 Document Revised: 09/09/2011 Document Reviewed: 09/11/2009 [facility] Patient Information 2014 [place], [place].    PATIENT INSTRUCTIONS POST-ANESTHESIA  IMMEDIATELY FOLLOWING SURGERY:  Do not drive or operate machinery for the first twenty four hours after surgery.   Do not make any important decisions for twenty four hours after surgery or while taking narcotic pain medications or sedatives.  If you develop intractable nausea and vomiting or a severe headache please notify your doctor immediately.  FOLLOW-UP:  Please make an appointment with your surgeon as instructed. You do not need to follow up with anesthesia unless specifically instructed to do so.  WOUND CARE INSTRUCTIONS (if applicable):  Keep a dry clean dressing on the anesthesia/puncture wound site if there is drainage.  Once the wound has quit draining you may leave it open to air.  Generally you should leave the bandage intact for twenty four hours unless there is drainage.  If the epidural site drains for more than 36-48 hours please call the anesthesia department.  QUESTIONS?:  Please feel free to call your physician or the hospital operator if you have any questions, and they will be happy to assist you.

## 2013-04-14 ENCOUNTER — Other Ambulatory Visit: Payer: Self-pay

## 2013-04-14 ENCOUNTER — Encounter (HOSPITAL_COMMUNITY): Payer: Self-pay

## 2013-04-14 ENCOUNTER — Encounter (HOSPITAL_COMMUNITY)
Admission: RE | Admit: 2013-04-14 | Discharge: 2013-04-14 | Disposition: A | Discharge status: Home / Self Care | Payer: Medicare Other | Source: Ambulatory Visit | Attending: General Surgery | Admitting: General Surgery

## 2013-04-14 DIAGNOSIS — Z01812 Encounter for preprocedural laboratory examination: Secondary | ICD-10-CM | POA: Insufficient documentation

## 2013-04-14 LAB — BASIC METABOLIC PANEL
BUN: 11 mg/dL (ref 6–23)
CO2: 28 mEq/L (ref 19–32)
Calcium: 10.3 mg/dL (ref 8.4–10.5)
Chloride: 97 mEq/L (ref 96–112)
Creatinine, Ser: 0.75 mg/dL (ref 0.50–1.10)
GFR calc Af Amer: >90 mL/min (ref >90)

## 2013-04-14 LAB — CBC WITH DIFFERENTIAL/PLATELET
Basophils Absolute: 0.1 10*3/uL (ref 0.0–0.1)
Basophils Relative: 1 % (ref 0–1)
Eosinophils Relative: 4 % (ref 0–5)
HCT: 38.5 % (ref 36.0–46.0)
MCHC: 35.6 g/dL (ref 30.0–36.0)
MCV: 85.4 fL (ref 78.0–100.0)
Monocytes Absolute: 0.6 10*3/uL (ref 0.1–1.0)
Platelets: 178 10*3/uL (ref 150–400)
RDW: 12.2 % (ref 11.5–15.5)
WBC: 6.8 10*3/uL (ref 4.0–10.5)

## 2013-04-26 NOTE — H&P (Signed)
  NTS SOAP Note  Vital Signs:  Vitals as of: 04/26/2013: Systolic 138: Diastolic 71: Heart Rate 57: Temp 98.22F: Height 56ft 5in: Weight 177Lbs 0 Ounces: Pain Level 4: BMI 29.45  BMI : 29.45 kg/m2  Subjective: This 67 Years 63 Months old Female presents for of  pain on the right mid back. Some discharge over the last week. Has had swelling and redness in this area for the last 1-2 months. Did notice a small nodule in this area over the last month. Denies any fevers or chills. Has been on antibiotics. Has been drained by her PCP (Dr. Juanetta Gosling)  Review of Symptoms:  Constitutional:unremarkable   Head:unremarkable    Eyes:unremarkable   Nose/Mouth/Throat:unremarkable Cardiovascular:  unremarkable   Respiratory:unremarkable   Gastrointestinal:  unremarkable   Genitourinary:unremarkable     Musculoskeletal:unremarkable    as per history of present illness Breast:unremarkable   Hematolgic/Lymphatic:unremarkable     Allergic/Immunologic:unremarkable     Past Medical History:    Reviewed   Past Medical History  Pregnancy Gravida: 2 Pregnancy Para: 2 Surgical History:  excision of breast cyst Medical Problems: none Psychiatric History: none Allergies:  doxycycline Medications: clindamycin   Social History:Reviewed  Social History  Preferred Language: English ([place]) Race:  White Ethnicity: Not Hispanic / Latino Age: 67 Years 9 Months Marital Status:  S Alcohol: no Recreational drug(s): no   Smoking Status: Never smoker reviewed on 09/09/2011 Functional Status reviewed on mm/dd/yyyy ------------------------------------------------ Bathing: Normal Cooking: Normal Dressing: Normal Driving: Normal Eating: Normal Managing Meds: Normal Oral Care: Normal Shopping: Normal Toileting: Normal Transferring: Normal Walking: Normal Cognitive Status reviewed on  mm/dd/yyyy ------------------------------------------------ Attention: Normal Decision Making: Normal Language: Normal Memory: Normal Motor: Normal Perception: Normal Problem Solving: Normal Visual and Spatial: Normal   Family History:  Reviewed   Family Health History  Is there a family history [id number]    Objective Information: General:  Well appearing, well nourished in no distress. Skin:     no rash or prominent lesions  except on back. There is an area of approximately 2 x 2 cm with surrounding erythema. There is a  draining mid incisional opening with  Purulence.   Head:Atraumatic; no masses; no abnormalities Eyes:  conjunctiva clear, EOM intact, PERRL Mouth:  Mucous membranes moist, no mucosal lesions. Heart:  RRR, no murmur Lungs:    CTA bilaterally, no wheezes, rhonchi, rales.  Breathing unlabored. Abdomen:Soft, NT/ND, no HSM, no masses.  Assessment:    Plan:  infected sebaceous cyst/abscess.  Procedure:   Scheduled for excision of sebaceous cyst, back on 05/03/13.

## 2013-04-27 ENCOUNTER — Encounter (HOSPITAL_COMMUNITY): Admission: RE | Admit: 2013-04-27 | Payer: Medicare Other | Source: Ambulatory Visit

## 2013-05-03 ENCOUNTER — Encounter (HOSPITAL_COMMUNITY): Payer: Self-pay | Admitting: *Deleted

## 2013-05-03 ENCOUNTER — Encounter (HOSPITAL_COMMUNITY)
Admission: RE | Disposition: A | Discharge status: Home / Self Care | Payer: Self-pay | Source: Ambulatory Visit | Attending: General Surgery

## 2013-05-03 ENCOUNTER — Ambulatory Visit (HOSPITAL_COMMUNITY)
Admission: RE | Admit: 2013-05-03 | Discharge: 2013-05-03 | Disposition: A | Discharge status: Home / Self Care | Payer: Medicare Other | Source: Ambulatory Visit | Attending: General Surgery | Admitting: General Surgery

## 2013-05-03 ENCOUNTER — Ambulatory Visit (HOSPITAL_COMMUNITY): Payer: Medicare Other | Admitting: Anesthesiology

## 2013-05-03 ENCOUNTER — Encounter (HOSPITAL_COMMUNITY): Payer: Medicare Other | Admitting: Anesthesiology

## 2013-05-03 DIAGNOSIS — L723 Sebaceous cyst: Secondary | ICD-10-CM | POA: Insufficient documentation

## 2013-05-03 DIAGNOSIS — Z01812 Encounter for preprocedural laboratory examination: Secondary | ICD-10-CM | POA: Insufficient documentation

## 2013-05-03 DIAGNOSIS — I10 Essential (primary) hypertension: Secondary | ICD-10-CM | POA: Insufficient documentation

## 2013-05-03 DIAGNOSIS — E119 Type 2 diabetes mellitus without complications: Secondary | ICD-10-CM | POA: Insufficient documentation

## 2013-05-03 HISTORY — PX: LESION REMOVAL: SHX5196

## 2013-05-03 LAB — GLUCOSE, CAPILLARY
Glucose-Capillary: 122 mg/dL — ABNORMAL HIGH (ref 70–99)
Glucose-Capillary: 128 mg/dL — ABNORMAL HIGH (ref 70–99)

## 2013-05-03 SURGERY — EXCISION, LESION, TORSO
Anesthesia: General | Site: Back | Wound class: Clean

## 2013-05-03 MED ORDER — BUPIVACAINE HCL (PF) 0.5 % IJ SOLN
INTRAMUSCULAR | Status: AC
  Filled 2013-05-03: qty 30

## 2013-05-03 MED ORDER — FENTANYL CITRATE 0.05 MG/ML IJ SOLN
INTRAMUSCULAR | Status: AC
  Filled 2013-05-03: qty 2

## 2013-05-03 MED ORDER — MIDAZOLAM HCL 2 MG/2ML IJ SOLN
INTRAMUSCULAR | Status: AC
  Filled 2013-05-03: qty 2

## 2013-05-03 MED ORDER — LACTATED RINGERS IV SOLN
INTRAVENOUS | Status: DC
  Administered 2013-05-03 (×2): via INTRAVENOUS

## 2013-05-03 MED ORDER — BUPIVACAINE HCL (PF) 0.5 % IJ SOLN
INTRAMUSCULAR | Status: DC | PRN
  Administered 2013-05-03: 4 mL

## 2013-05-03 MED ORDER — GLYCOPYRROLATE 0.2 MG/ML IJ SOLN
INTRAMUSCULAR | Status: DC | PRN
  Administered 2013-05-03: 0.2 mg via INTRAVENOUS

## 2013-05-03 MED ORDER — MIDAZOLAM HCL 2 MG/2ML IJ SOLN
1.0000 mg | INTRAMUSCULAR | Status: DC | PRN
  Administered 2013-05-03: 2 mg via INTRAVENOUS

## 2013-05-03 MED ORDER — CEFAZOLIN SODIUM-DEXTROSE 2-3 GM-% IV SOLR
2.0000 g | INTRAVENOUS | Status: AC
  Administered 2013-05-03: 2 g via INTRAVENOUS
  Filled 2013-05-03: qty 50

## 2013-05-03 MED ORDER — ENOXAPARIN SODIUM 40 MG/0.4ML ~~LOC~~ SOLN
40.0000 mg | Freq: Once | SUBCUTANEOUS | Status: AC
  Administered 2013-05-03: 40 mg via SUBCUTANEOUS
  Filled 2013-05-03: qty 0.4

## 2013-05-03 MED ORDER — 0.9 % SODIUM CHLORIDE (POUR BTL) OPTIME
TOPICAL | Status: DC | PRN
  Administered 2013-05-03: 1000 mL

## 2013-05-03 MED ORDER — FENTANYL CITRATE 0.05 MG/ML IJ SOLN
INTRAMUSCULAR | Status: DC | PRN
  Administered 2013-05-03 (×4): 25 ug via INTRAVENOUS

## 2013-05-03 MED ORDER — KETOROLAC TROMETHAMINE 30 MG/ML IJ SOLN
30.0000 mg | Freq: Once | INTRAMUSCULAR | Status: DC
  Filled 2013-05-03: qty 1

## 2013-05-03 MED ORDER — FENTANYL CITRATE 0.05 MG/ML IJ SOLN: 25.0000 ug | INTRAMUSCULAR | Status: DC | PRN

## 2013-05-03 MED ORDER — ONDANSETRON HCL 4 MG/2ML IJ SOLN
INTRAMUSCULAR | Status: AC
  Filled 2013-05-03: qty 2

## 2013-05-03 MED ORDER — FENTANYL CITRATE 0.05 MG/ML IJ SOLN
25.0000 ug | INTRAMUSCULAR | Status: AC
  Administered 2013-05-03 (×2): 25 ug via INTRAVENOUS

## 2013-05-03 MED ORDER — HYDROCODONE-ACETAMINOPHEN 7.5-500 MG PO TABS: 1.0000 | ORAL_TABLET | Freq: Four times a day (QID) | ORAL | Status: DC | PRN

## 2013-05-03 MED ORDER — ONDANSETRON HCL 4 MG/2ML IJ SOLN: 4.0000 mg | Freq: Once | INTRAMUSCULAR | Status: DC | PRN

## 2013-05-03 MED ORDER — CHLORHEXIDINE GLUCONATE 4 % EX LIQD: 1.0000 "application " | Freq: Once | CUTANEOUS | Status: DC

## 2013-05-03 MED ORDER — GLYCOPYRROLATE 0.2 MG/ML IJ SOLN
INTRAMUSCULAR | Status: AC
  Filled 2013-05-03: qty 1

## 2013-05-03 MED ORDER — PROPOFOL 10 MG/ML IV EMUL
INTRAVENOUS | Status: AC
  Filled 2013-05-03: qty 20

## 2013-05-03 MED ORDER — EPHEDRINE SULFATE 50 MG/ML IJ SOLN
INTRAMUSCULAR | Status: AC
  Filled 2013-05-03: qty 1

## 2013-05-03 MED ORDER — PROPOFOL 10 MG/ML IV BOLUS
INTRAVENOUS | Status: DC | PRN
  Administered 2013-05-03: 150 mg via INTRAVENOUS

## 2013-05-03 MED ORDER — EPHEDRINE SULFATE 50 MG/ML IJ SOLN
INTRAMUSCULAR | Status: DC | PRN
  Administered 2013-05-03 (×4): 5 mg via INTRAVENOUS

## 2013-05-03 MED ORDER — ONDANSETRON HCL 4 MG/2ML IJ SOLN
INTRAMUSCULAR | Status: DC | PRN
  Administered 2013-05-03: 4 mg via INTRAVENOUS

## 2013-05-03 SURGICAL SUPPLY — 35 items
BAG HAMPER (MISCELLANEOUS) ×2 IMPLANT
BLADE SURG 15 STRL LF DISP TIS (BLADE) ×1 IMPLANT
BLADE SURG 15 STRL SS (BLADE) ×1
CLOTH BEACON ORANGE TIMEOUT ST (SAFETY) ×2 IMPLANT
COVER LIGHT HANDLE STERIS (MISCELLANEOUS) ×4 IMPLANT
DECANTER SPIKE VIAL GLASS SM (MISCELLANEOUS) ×2 IMPLANT
DERMABOND ADVANCED (GAUZE/BANDAGES/DRESSINGS)
DERMABOND ADVANCED .7 DNX12 (GAUZE/BANDAGES/DRESSINGS) IMPLANT
DRSG TEGADERM 4X4.75 (GAUZE/BANDAGES/DRESSINGS) ×2 IMPLANT
DURAPREP 26ML APPLICATOR (WOUND CARE) ×2 IMPLANT
ELECT NEEDLE TIP 2.8 STRL (NEEDLE) IMPLANT
ELECT REM PT RETURN 9FT ADLT (ELECTROSURGICAL) ×2
ELECTRODE REM PT RTRN 9FT ADLT (ELECTROSURGICAL) ×1 IMPLANT
FORMALIN 10 PREFIL 120ML (MISCELLANEOUS) ×2 IMPLANT
GLOVE BIO SURGEON STRL SZ7.5 (GLOVE) ×2 IMPLANT
GLOVE BIOGEL PI IND STRL 7.0 (GLOVE) ×1 IMPLANT
GLOVE BIOGEL PI INDICATOR 7.0 (GLOVE) ×1
GLOVE SS BIOGEL STRL SZ 6.5 (GLOVE) ×1 IMPLANT
GLOVE SUPERSENSE BIOGEL SZ 6.5 (GLOVE) ×1
GOWN STRL REIN XL XLG (GOWN DISPOSABLE) ×6 IMPLANT
KIT ROOM TURNOVER APOR (KITS) ×2 IMPLANT
MANIFOLD NEPTUNE II (INSTRUMENTS) ×2 IMPLANT
NEEDLE HYPO 25X1 1.5 SAFETY (NEEDLE) ×2 IMPLANT
NS IRRIG 1000ML POUR BTL (IV SOLUTION) ×2 IMPLANT
PACK MINOR (CUSTOM PROCEDURE TRAY) IMPLANT
PAD ARMBOARD 7.5X6 YLW CONV (MISCELLANEOUS) ×2 IMPLANT
SET BASIN LINEN APH (SET/KITS/TRAYS/PACK) ×2 IMPLANT
SPONGE GAUZE 2X2 8PLY STRL LF (GAUZE/BANDAGES/DRESSINGS) ×2 IMPLANT
SUT ETHILON 3 0 FSL (SUTURE) IMPLANT
SUT ETHILON 4 0 P 3 18 (SUTURE) ×4 IMPLANT
SUT PROLENE 4 0 PS 2 18 (SUTURE) IMPLANT
SUT VIC AB 3-0 SH 27 (SUTURE)
SUT VIC AB 3-0 SH 27X BRD (SUTURE) IMPLANT
SUT VIC AB 4-0 PS2 27 (SUTURE) IMPLANT
SYR CONTROL 10ML LL (SYRINGE) ×2 IMPLANT

## 2013-05-03 NOTE — Anesthesia Preprocedure Evaluation (Addendum)
Anesthesia Evaluation  Patient identified by MRN, date of birth, ID band Patient awake    Reviewed: Allergy & Precautions, H&P , NPO status , Patient's Chart, lab work & pertinent test results  Airway Mallampati: II TM Distance: >3 FB Neck ROM: Full    Dental  (+) Edentulous Upper and Edentulous Lower   Pulmonary shortness of breath, former smoker,  breath sounds clear to auscultation        Cardiovascular hypertension, Pt. on medications + dysrhythmias (episode tachycardia resolved spontaneously) Atrial Fibrillation Rhythm:Regular Rate:Normal     Neuro/Psych    GI/Hepatic GERD- (inactive  now)  Controlled and Medicated,  Endo/Other  diabetes, Type 2, Oral Hypoglycemic Agents  Renal/GU      Musculoskeletal   Abdominal   Peds  Hematology   Anesthesia Other Findings   Reproductive/Obstetrics                          Anesthesia Physical Anesthesia Plan  ASA: III  Anesthesia Plan: General   Post-op Pain Management:    Induction: Intravenous  Airway Management Planned: LMA  Additional Equipment:   Intra-op Plan:   Post-operative Plan: Extubation in OR  Informed Consent: I have reviewed the patients History and Physical, chart, labs and discussed the procedure including the risks, benefits and alternatives for the proposed anesthesia with the patient or authorized representative who has indicated his/her understanding and acceptance.     Plan Discussed with:   Anesthesia Plan Comments:         Anesthesia Quick Evaluation

## 2013-05-03 NOTE — Transfer of Care (Signed)
Immediate Anesthesia Transfer of Care Note  Patient: Erika Lee  Procedure(s) Performed: Procedure(s): EXCISION CYST, BACK (N/A)  Patient Location: PACU  Anesthesia Type:General  Level of Consciousness: awake, alert  and oriented  Airway & Oxygen Therapy: Patient Spontanous Breathing and Patient connected to face mask oxygen  Post-op Assessment: Report given to PACU RN  Post vital signs: Reviewed and stable  Complications: No apparent anesthesia complications

## 2013-05-03 NOTE — Interval H&P Note (Signed)
History and Physical Interval Note:  05/03/2013 7:33 AM  Erika Lee  has presented today for surgery, with the diagnosis of benign lesion on back  The various methods of treatment have been discussed with the patient and family. After consideration of risks, benefits and other options for treatment, the patient has consented to  Procedure(s): EXCISION BENIGN LESION ON BACK (N/A) as a surgical intervention .  The patient's history has been reviewed, patient examined, no change in status, stable for surgery.  I have reviewed the patient's chart and labs.  Questions were answered to the patient's satisfaction.     Franky Macho A

## 2013-05-03 NOTE — Preoperative (Signed)
Beta Blockers   Reason not to administer Beta Blockers:Not Applicable 

## 2013-05-03 NOTE — Op Note (Signed)
Patient:  Erika Lee  DOB:  Feb 25, 1946  MRN:  [id number]   Preop Diagnosis:  Sebaceous cyst, back  Postop Diagnosis:  Same  Procedure:  Excision of sebaceous cyst, back  Surgeon:  Franky Macho, M.D.  Anes:  General  Indications:  Patient is a 67 year old white female who is been on antibiotics for an infected sebaceous cyst on her back. It continues to drain. She now presents to the operating room for excision of the sebaceous cyst, back. The risks and benefits of the procedure were fully explained to the patient, who gave informed consent.  Procedure note:  The patient was placed in the left lateral decubitus position after general anesthesia was administered. A lower back was prepped and draped using usual sterile technique with DuraPrep. Surgical site confirmation was performed.  An elliptical incision was made around the sebaceous cyst on her lower back which measured approximately 2 cm in its greatest diameter. A full thickness excision of the cystic region was performed. The remnants were disposed of. Granulation tissue and cecum were present. A curette was used to excise any remaining granulation tissue. The wound is irrigated normal saline. 0.5% Sensorcaine was instilled the surrounding wound. The incision was closed using 4-0 nylon interrupted sutures. Betadine ointment and dry sterile dressing was applied.  All tape and needle counts were correct the end of the procedure. Patient was awakened and transferred to PACU in stable condition.  Complications:  None  EBL:  Minimal  Specimen:  None

## 2013-05-04 ENCOUNTER — Encounter (HOSPITAL_COMMUNITY): Payer: Self-pay | Admitting: General Surgery

## 2013-05-04 NOTE — Anesthesia Postprocedure Evaluation (Signed)
  Anesthesia Post-op Note  Patient: Erika Lee  Procedure(s) Performed: Procedure(s): EXCISION CYST, BACK (N/A)  Patient Location: PACU  Anesthesia Type:General  Level of Consciousness: awake  Airway and Oxygen Therapy: Patient Spontanous Breathing  Post-op Pain: mild  Post-op Assessment: Post-op Vital signs reviewed, Patient's Cardiovascular Status Stable, Respiratory Function Stable and Patent Airway  Post-op Vital Signs: Reviewed and stable  Complications: No apparent anesthesia complications Late entry 05/04/2013 0749 Minerva Areola CRNA

## 2013-05-13 ENCOUNTER — Encounter: Payer: Self-pay | Admitting: *Deleted

## 2013-05-13 DIAGNOSIS — E785 Hyperlipidemia, unspecified: Secondary | ICD-10-CM | POA: Insufficient documentation

## 2013-05-13 DIAGNOSIS — E119 Type 2 diabetes mellitus without complications: Secondary | ICD-10-CM

## 2013-05-14 ENCOUNTER — Ambulatory Visit (INDEPENDENT_AMBULATORY_CARE_PROVIDER_SITE_OTHER): Payer: Medicare Other | Admitting: Cardiology

## 2013-05-14 ENCOUNTER — Encounter: Payer: Self-pay | Admitting: Cardiology

## 2013-05-14 VITALS — BP 147/78 | HR 56 | Ht 64.0 in | Wt 176.0 lb

## 2013-05-14 DIAGNOSIS — I4891 Unspecified atrial fibrillation: Secondary | ICD-10-CM

## 2013-05-14 DIAGNOSIS — R002 Palpitations: Secondary | ICD-10-CM

## 2013-05-14 DIAGNOSIS — E119 Type 2 diabetes mellitus without complications: Secondary | ICD-10-CM

## 2013-05-14 DIAGNOSIS — I10 Essential (primary) hypertension: Secondary | ICD-10-CM

## 2013-05-14 DIAGNOSIS — Z8679 Personal history of other diseases of the circulatory system: Secondary | ICD-10-CM

## 2013-05-14 NOTE — Assessment & Plan Note (Signed)
Followed by Dr. Juanetta Gosling, on oral hypoglycemics, ACE inhibitor, and statin.

## 2013-05-14 NOTE — Progress Notes (Signed)
Clinical Summary Erika Lee is a 67 y.o.female last seen in June 2012. She was seen recently by Dr. Juanetta Gosling and referred back to the office with reported intermittent tachycardia. Cardiac history is detailed below. She reports at least 3 episodes of a sudden feeling of rapid heart rate, also forceful heartbeat. Most of these have occurred at nighttime hours, sometimes when she has gotten out of bed. She felt dizzy with one of the episodes, no chest pain or syncope.  Recent ECG from July of this year showed sinus bradycardia with PAC. She did undergo cardiac monitoring back in 2012, no significant arrhythmias noted at that time. Echocardiogram from March 2012 demonstrated mild LVH with LVEF 55-60%, mild left atrial enlargement, no PFO.  Heart rate is regular today and she feels normal, at baseline.   Allergies  Allergen Reactions  . Doxycycline Other (See Comments)    Makes heart race  . Sulfonamide Derivatives Nausea And Vomiting  . Adhesive [Tape] Rash  . Clindamycin/Lincomycin Rash    Current Outpatient Prescriptions  Medication Sig Dispense Refill  . aspirin 81 MG EC tablet Take 81 mg by mouth daily.        Marland Kitchen esomeprazole (NEXIUM) 40 MG capsule Take 40 mg by mouth daily before breakfast.        . hydrochlorothiazide 25 MG tablet Take 25 mg by mouth daily.        Marland Kitchen HYDROcodone-acetaminophen (LORTAB) 7.5-500 MG per tablet Take 1 tablet by mouth every 6 (six) hours as needed. For pain  30 tablet  0  . lisinopril (PRINIVIL,ZESTRIL) 10 MG tablet Take 10 mg by mouth daily.        Marland Kitchen loratadine (CLARITIN) 10 MG tablet Take 10 mg by mouth daily as needed for allergies.      . metFORMIN (GLUCOPHAGE) 500 MG tablet Take 500 mg by mouth daily.      . Omega-3 Fatty Acids (FISH OIL) 1000 MG CAPS Take 1,000 mg by mouth daily.       . Potassium Gluconate 550 MG TABS Take 1 tablet by mouth daily.      . simvastatin (ZOCOR) 40 MG tablet Take 40 mg by mouth at bedtime.        . thiamine (VITAMIN  B-1) 100 MG tablet Take 100 mg by mouth daily.       No current facility-administered medications for this visit.    Past Medical History  Diagnosis Date  . Hyperlipidemia   . Multinodular goiter   . Atrial fibrillation     Documented 2008, spontaneously converted  . GERD (gastroesophageal reflux disease)   . Essential hypertension, benign   . Palpitations   . Diabetes mellitus     Borderline  . Arthritis     Past Surgical History  Procedure Laterality Date  . Biopsy thyroid    . Tonsillectomy    . Colonoscopy  09/10/2011    Procedure: COLONOSCOPY;  Surgeon: Dalia Heading, MD;  Location: [facility];  Service: Gastroenterology;  Laterality: N/A;  . Lesion removal N/A 05/03/2013    Procedure: EXCISION CYST, BACK;  Surgeon: Dalia Heading, MD;  Location: [facility] ORS;  Service: General;  Laterality: N/A;    Family History  Problem Relation Age of Onset  . Stroke Mother     Social History Erika Lee reports that she quit smoking about 19 years ago. Her smoking use included Cigarettes. She smoked 0.00 packs per day. She has never used smokeless tobacco. Erika Lee reports  that she does not drink alcohol.  Review of Systems Negative except as outlined.  Physical Examination Filed Vitals:   05/14/13 1104  BP: 147/78  Pulse: 56   Filed Weights   05/14/13 1104  Weight: 176 lb (79.833 kg)    Overweight woman in no acute distress.  HEENT: Conjunctiva and lids normal, oropharynx with moist mucosa.  Neck: Supple, no elevated JVP or carotid bruits. Lungs: Clear to auscultation, nonlabored.  Cardiac: Regular rate and rhythm, soft systolic murmur at the base, no pericardial rub.  Abdomen: Soft, nontender, bowel sounds present.  Skin: Warm and dry.  Musculoskeletal: No kyphosis.  Extremities: No pitting edema.  Neuropsychiatric: Alert and oriented x3, affect appropriate.    Problem List and Plan   Palpitations Discussed above. We will place an event monitor to  investigate possible paroxysmal arrhythmia. Office followup arranged.  History of atrial fibrillation Documented in 2008 with spontaneous resolution. She has had no definitive arrhythmias since that time.  Essential hypertension, benign Followed by Dr. Juanetta Gosling.  Type 2 diabetes mellitus Followed by Dr. Juanetta Gosling, on oral hypoglycemics, ACE inhibitor, and statin.    Jonelle Sidle, M.D., F.A.C.C.

## 2013-05-14 NOTE — Assessment & Plan Note (Signed)
Documented in 2008 with spontaneous resolution. She has had no definitive arrhythmias since that time.

## 2013-05-14 NOTE — Assessment & Plan Note (Signed)
Followed by Dr. Hawkins. 

## 2013-05-14 NOTE — Assessment & Plan Note (Signed)
Discussed above. We will place an event monitor to investigate possible paroxysmal arrhythmia. Office followup arranged.

## 2013-05-14 NOTE — Patient Instructions (Addendum)
Your physician recommends that you schedule a follow-up appointment in: one month  Your physician has recommended that you wear an event monitor. Event monitors are medical devices that record the heart's electrical activity. Doctors most often [place] these monitors to diagnose arrhythmias. Arrhythmias are problems with the speed or rhythm of the heartbeat. The monitor is a small, portable device. You can wear one while you do your normal daily activities. This is usually used to diagnose what is causing palpitations/syncope (passing out).UNTIL 06-04-13 WE WILL DISCUSS THE RESULTS WITH YOU ARE YOUR NEXT OFFICE VISIT

## 2013-05-15 ENCOUNTER — Telehealth: Payer: Self-pay | Admitting: Internal Medicine

## 2013-05-15 NOTE — Telephone Encounter (Signed)
Pt had episode of Atrial fibrillation on event monitor. was asypmtomatic with this. instruced that Dr Erlinda Hong [place] office need to be called with this results. Will also route this note to him

## 2013-05-17 ENCOUNTER — Other Ambulatory Visit (HOSPITAL_COMMUNITY): Payer: Self-pay | Admitting: Pulmonary Disease

## 2013-05-17 ENCOUNTER — Encounter: Payer: Self-pay | Admitting: Cardiology

## 2013-05-17 DIAGNOSIS — Z139 Encounter for screening, unspecified: Secondary | ICD-10-CM

## 2013-05-17 NOTE — Telephone Encounter (Signed)
Continue to wear monitor. Await strips for review at office. If she is having PAF regularly, we will need to discuss options of antiarrhythmic therapy and also anticoagulation. She already has office followup arranged. Please go ahead and have her complete a stress echo as well for ischemic evaluation since this will be helpful in determining antiarrhythmic agent.

## 2013-05-17 NOTE — Telephone Encounter (Signed)
LM giving pt Dr.McDowells suggstions

## 2013-05-18 ENCOUNTER — Other Ambulatory Visit: Payer: Self-pay | Admitting: *Deleted

## 2013-05-18 DIAGNOSIS — R002 Palpitations: Secondary | ICD-10-CM

## 2013-05-18 DIAGNOSIS — I4891 Unspecified atrial fibrillation: Secondary | ICD-10-CM

## 2013-05-24 ENCOUNTER — Ambulatory Visit (HOSPITAL_COMMUNITY): Payer: Medicare Other

## 2013-05-25 ENCOUNTER — Telehealth: Payer: Self-pay | Admitting: *Deleted

## 2013-05-25 DIAGNOSIS — I255 Ischemic cardiomyopathy: Secondary | ICD-10-CM

## 2013-05-25 MED ORDER — METOPROLOL SUCCINATE ER 25 MG PO TB24: 12.5000 mg | ORAL_TABLET | Freq: Every day | ORAL | Status: DC

## 2013-05-25 NOTE — Telephone Encounter (Signed)
If she is still wearing the heart monitor, please try and obtain some strips from today, the last 24 hours if possible. We could try and put her on low-dose Toprol-XL 12.5 mg daily, although her heart rate is reasonable at rest. Please see my last telephone note. She should already be scheduled for a stress test and office followup, we may need to consider antiarrhythmic treatments.

## 2013-05-25 NOTE — Telephone Encounter (Signed)
Left message to call back office.

## 2013-05-25 NOTE — Telephone Encounter (Signed)
Pt called stating that she went shopping yesterday and that she was real weak and SOB. This morning BP was 186/80 pulse 68.  Pt was in bed with a constant discomfort in middle of chest and felt like her heart was pounding hard. Pt states she does have some dizziness. Pt BP now is 150/73 with heart rate of 72. Pt wants to be seen today. This nurse told pt we might not have a spot for her. That if she feels worse or keeps having discomfort to go to there ER. Pt is wearing a heart monitor. Please advise.

## 2013-05-25 NOTE — Telephone Encounter (Signed)
Advised pt about upcoming appointment for stress echo on 06/10/13 at 10:30 am and that this nurse sent in a new prescription Toprol XL 12.5 mg daily.Advised pt to hold this medication the moprning of procedure.  Pt understands and will pick up medicine at her pharmacy.

## 2013-06-10 ENCOUNTER — Encounter (HOSPITAL_COMMUNITY): Payer: Self-pay | Admitting: Emergency Medicine

## 2013-06-10 ENCOUNTER — Other Ambulatory Visit: Payer: Self-pay

## 2013-06-10 ENCOUNTER — Encounter (HOSPITAL_COMMUNITY): Payer: Self-pay

## 2013-06-10 ENCOUNTER — Inpatient Hospital Stay (HOSPITAL_COMMUNITY)
Admission: EM | Admit: 2013-06-10 | Discharge: 2013-06-13 | DRG: 310 | Disposition: A | Discharge status: Home / Self Care | Payer: Medicare Other | Attending: Pulmonary Disease | Admitting: Pulmonary Disease

## 2013-06-10 ENCOUNTER — Ambulatory Visit (HOSPITAL_COMMUNITY)
Admission: RE | Admit: 2013-06-10 | Discharge: 2013-06-10 | Disposition: A | Discharge status: Home / Self Care | Payer: Medicare Other | Source: Ambulatory Visit | Attending: Cardiology | Admitting: Cardiology

## 2013-06-10 DIAGNOSIS — K219 Gastro-esophageal reflux disease without esophagitis: Secondary | ICD-10-CM | POA: Diagnosis present

## 2013-06-10 DIAGNOSIS — R197 Diarrhea, unspecified: Secondary | ICD-10-CM | POA: Diagnosis present

## 2013-06-10 DIAGNOSIS — E785 Hyperlipidemia, unspecified: Secondary | ICD-10-CM | POA: Diagnosis present

## 2013-06-10 DIAGNOSIS — R002 Palpitations: Secondary | ICD-10-CM

## 2013-06-10 DIAGNOSIS — I255 Ischemic cardiomyopathy: Secondary | ICD-10-CM

## 2013-06-10 DIAGNOSIS — R001 Bradycardia, unspecified: Secondary | ICD-10-CM | POA: Diagnosis present

## 2013-06-10 DIAGNOSIS — E119 Type 2 diabetes mellitus without complications: Secondary | ICD-10-CM | POA: Diagnosis present

## 2013-06-10 DIAGNOSIS — M129 Arthropathy, unspecified: Secondary | ICD-10-CM | POA: Diagnosis present

## 2013-06-10 DIAGNOSIS — Z87891 Personal history of nicotine dependence: Secondary | ICD-10-CM

## 2013-06-10 DIAGNOSIS — I4891 Unspecified atrial fibrillation: Secondary | ICD-10-CM

## 2013-06-10 DIAGNOSIS — R000 Tachycardia, unspecified: Secondary | ICD-10-CM | POA: Diagnosis present

## 2013-06-10 DIAGNOSIS — Z7982 Long term (current) use of aspirin: Secondary | ICD-10-CM

## 2013-06-10 DIAGNOSIS — I480 Paroxysmal atrial fibrillation: Secondary | ICD-10-CM | POA: Diagnosis present

## 2013-06-10 DIAGNOSIS — Z823 Family history of stroke: Secondary | ICD-10-CM

## 2013-06-10 DIAGNOSIS — Z79899 Other long term (current) drug therapy: Secondary | ICD-10-CM

## 2013-06-10 DIAGNOSIS — I10 Essential (primary) hypertension: Secondary | ICD-10-CM | POA: Diagnosis present

## 2013-06-10 DIAGNOSIS — I495 Sick sinus syndrome: Secondary | ICD-10-CM | POA: Diagnosis present

## 2013-06-10 LAB — BASIC METABOLIC PANEL
CO2: 23 mEq/L (ref 19–32)
Chloride: 95 mEq/L — ABNORMAL LOW (ref 96–112)
Creatinine, Ser: 0.9 mg/dL (ref 0.50–1.10)
GFR calc Af Amer: 74 mL/min — ABNORMAL LOW (ref >90)
Glucose, Bld: 236 mg/dL — ABNORMAL HIGH (ref 70–99)
Potassium: 3.4 mEq/L — ABNORMAL LOW (ref 3.5–5.1)

## 2013-06-10 LAB — CBC
MCV: 85.5 fL (ref 78.0–100.0)
Platelets: 201 10*3/uL (ref 150–400)
RBC: 5.03 MIL/uL (ref 3.87–5.11)
WBC: 10 10*3/uL (ref 4.0–10.5)

## 2013-06-10 LAB — TROPONIN I: Troponin I: <0.3 ng/mL (ref <0.30)

## 2013-06-10 MED ORDER — DILTIAZEM LOAD VIA INFUSION
10.0000 mg | Freq: Once | INTRAVENOUS | Status: AC
  Administered 2013-06-10: 10 mg via INTRAVENOUS

## 2013-06-10 MED ORDER — DILTIAZEM HCL 100 MG IV SOLR
5.0000 mg/h | INTRAVENOUS | Status: DC
  Administered 2013-06-10: 5 mg/h via INTRAVENOUS

## 2013-06-10 NOTE — ED Provider Notes (Addendum)
CSN: [id number]     Arrival date & time 06/10/13  2237 History  This chart was scribed for Erika Lyons, MD by Valera Castle, ED Scribe. This patient was seen in room APA19/APA19 and the patient's care was started at 10:58 PM.    Chief Complaint  Patient presents with  . Chest Pain    The history is provided by the patient. No language interpreter was used.   HPI Comments: Erika Lee is a 67 y.o. female brought in with her husband, with h/o atrial fibrillation and palpitations who presents to the Emergency Department complaining of fast heart beat and sudden chest pain that radiates through to her backbone, onset 2100 this evening. She reports associated heavy pressure over her chest as if someone was sitting on it. She reports her heart is beating so hard it feels like it is moving her body. She reports h/o similar symptoms, but denies being diagnosed with a specific cause. She reports having a stress test this morning, stating she had good results. She reports last night having a blood pressure spike. She reports taking medication for her h/o blood pressure, DM. She reports being on Metoprolol, and having taken one today with no relief. She denies taking any before her stress test this morning. She denies h/o stent placement, MI. Per nursing note, she denies diaphoresis, nausea, emesis, SOB, and any other associated symptoms.   11:03 PM - She reports currently feeling like she is going to pass out.   PCP - Fredirick Maudlin, MD  Past Medical History  Diagnosis Date  . Hyperlipidemia   . Multinodular goiter   . Atrial fibrillation     Documented 2008, spontaneously converted  . GERD (gastroesophageal reflux disease)   . Essential hypertension, benign   . Palpitations   . Diabetes mellitus     Borderline  . Arthritis    Past Surgical History  Procedure Laterality Date  . Biopsy thyroid    . Tonsillectomy    . Colonoscopy  09/10/2011    Procedure: COLONOSCOPY;  Surgeon: Dalia Heading, MD;  Location: [facility];  Service: Gastroenterology;  Laterality: N/A;  . Lesion removal N/A 05/03/2013    Procedure: EXCISION CYST, BACK;  Surgeon: Dalia Heading, MD;  Location: [facility];  Service: General;  Laterality: N/A;   Family History  Problem Relation Age of Onset  . Stroke Mother    History  Substance Use Topics  . Smoking status: Former Smoker    Types: Cigarettes    Quit date: 11/29/1993  . Smokeless tobacco: Never Used  . Alcohol Use: No   OB History   Grav Para Term Preterm Abortions TAB SAB Ect Mult Living                 Review of Systems  Constitutional: Negative for diaphoresis.  Respiratory: Negative for shortness of breath.   Cardiovascular: Positive for chest pain.  Gastrointestinal: Negative for nausea and vomiting.  Neurological: Positive for light-headedness.    Allergies  Doxycycline; Sulfonamide derivatives; Adhesive; and Clindamycin/lincomycin  Home Medications   Current Outpatient Rx  Name  Route  Sig  Dispense  Refill  . aspirin 81 MG EC tablet   Oral   Take 81 mg by mouth daily.           Marland Kitchen esomeprazole (NEXIUM) 40 MG capsule   Oral   Take 40 mg by mouth daily before breakfast.           .  hydrochlorothiazide 25 MG tablet   Oral   Take 25 mg by mouth daily.           Marland Kitchen HYDROcodone-acetaminophen (LORTAB) 7.5-500 MG per tablet   Oral   Take 1 tablet by mouth every 6 (six) hours as needed. For pain   30 tablet   0   . lisinopril (PRINIVIL,ZESTRIL) 10 MG tablet   Oral   Take 10 mg by mouth daily.           Marland Kitchen loratadine (CLARITIN) 10 MG tablet   Oral   Take 10 mg by mouth daily as needed for allergies.         . metFORMIN (GLUCOPHAGE) 500 MG tablet   Oral   Take 500 mg by mouth daily.         . metoprolol succinate (TOPROL XL) 25 MG 24 hr tablet   Oral   Take 0.5 tablets (12.5 mg total) by mouth daily.   60 tablet   3   . Omega-3 Fatty Acids (FISH OIL) 1000 MG CAPS   Oral   Take 1,000 mg by  mouth daily.          . Potassium Gluconate 550 MG TABS   Oral   Take 1 tablet by mouth daily.         . simvastatin (ZOCOR) 40 MG tablet   Oral   Take 40 mg by mouth at bedtime.           . thiamine (VITAMIN B-1) 100 MG tablet   Oral   Take 100 mg by mouth daily.          BP 109/70  Pulse 70  Temp(Src) 98.2 F (36.8 C) (Oral)  Resp 14  Ht 5\' 4"  (1.626 m)  Wt 170 lb (77.111 kg)  BMI 29.17 kg/m2  SpO2 96%  Physical Exam  Nursing note and vitals reviewed. Constitutional: She is oriented to person, place, and time. She appears well-developed and well-nourished. No distress.  HENT:  Head: Normocephalic and atraumatic.  Eyes: EOM are normal.  Neck: Neck supple. No tracheal deviation present.  Cardiovascular: Normal rate.   Heart is irregularly irregular and rapid.   Pulmonary/Chest: Effort normal. No respiratory distress.  Musculoskeletal: Normal range of motion.  Neurological: She is alert and oriented to person, place, and time.  Skin: Skin is warm and dry.  Psychiatric: She has a normal mood and affect. Her behavior is normal.    ED Course  Procedures (including critical care time)  DIAGNOSTIC STUDIES: Oxygen Saturation is 96% on room air, normal by my interpretation.    COORDINATION OF CARE: 11:02 PM- Discussed treatment plan which includes CBC, BMP, Troponin, Cardiac monitoring, Pulse ox, EKG, and saline lock with pt at bedside and pt agreed to plan.   11:07 PM - Discussed with pt she is currently having atrial fibrillation, discussing definition and symptoms.   11:23 PM - Recheck   Labs Review Labs Reviewed  CBC - Abnormal; Notable for the following:    Hemoglobin 15.2 (*)    All other components within normal limits  BASIC METABOLIC PANEL - Abnormal; Notable for the following:    Potassium 3.4 (*)    Chloride 95 (*)    Glucose, Bld 236 (*)    GFR calc non Af Amer 64 (*)    GFR calc Af Amer 74 (*)    All other components within normal limits   TROPONIN I  TSH   Imaging Review No results  found.   Date: 06/11/2013  Rate: 143  Rhythm: atrial fibrillation  QRS Axis: normal  Intervals: normal  ST/T Wave abnormalities: nonspecific T wave changes  Conduction Disutrbances:none  Narrative Interpretation:   Old EKG Reviewed: changes noted   Date: 06/11/2013  Rate: 50  Rhythm: sinus bradycardia  QRS Axis: normal  Intervals: normal  ST/T Wave abnormalities: normal  Conduction Disutrbances:none  Narrative Interpretation:   Old EKG Reviewed: converted to sinus rhythm     Meds ordered this encounter  Medications  . diltiazem (CARDIZEM) 1 mg/mL load via infusion 10 mg    Sig:    And  . diltiazem (CARDIZEM) 100 mg in dextrose 5 % 100 mL infusion    Sig:    MDM  No diagnosis found. Patient is a 67 year old female with history of hypertension and diabetes. He presents with complaints of palpitations. Her initial EKG revealed an atrial fibrillation with rapid ventricular response. While I was obtaining the patient's history she told me that she suddenly felt lightheaded and her heart rate was noted to be 240. Her pulses were thready and I called the nurse to obtain the crash cart. She was then placed on the monitor, however several minutes later her rate again slowed to 140. She was started on a Cardizem drip which within approximately 15 minutes converted her to a sinus rhythm.  Laboratory studies are unremarkable including troponin and electrolytes, with the exception of an elevated glucose. I've spoken with Dr. Onalee Hua from [facility] who agrees to admit the patient. She will be observed overnight and evaluated by cardiology in the morning.  CRITICAL CARE Performed by: Erika Lee Total critical care time: 30 minutes Critical care time was exclusive of separately billable procedures and treating other patients. Critical care was necessary to treat or prevent imminent or life-threatening deterioration. Critical care was time  spent personally by me on the following activities: development of treatment plan with patient and/or surrogate as well as nursing, discussions with consultants, evaluation of patient's response to treatment, examination of patient, obtaining history from patient or surrogate, ordering and performing treatments and interventions, ordering and review of laboratory studies, ordering and review of radiographic studies, pulse oximetry and re-evaluation of patient's condition.     I personally performed the services described in this documentation, which was scribed in my presence. The recorded information has been reviewed and is accurate.      Erika Lyons, MD 06/11/13 0028  Erika Lyons, MD 06/11/13 [phone]

## 2013-06-10 NOTE — ED Notes (Signed)
Dr. Judd Lien notified of pt's bradycardia - VO given to stop cardizem drip

## 2013-06-10 NOTE — Progress Notes (Signed)
*  PRELIMINARY RESULTS* Echocardiogram Echocardiogram Stress Test has been performed.  Conrad [facility] 06/10/2013, 12:32 PM

## 2013-06-10 NOTE — ED Notes (Signed)
While Dr. Judd Lien was assessing pt - pt noted to go into a rhythm w/ a HR of 241, crash cart requested - pt immediately put on zoll monitor. Pt remained A&Ox4 throughout event, cardizem drip immediately started.

## 2013-06-10 NOTE — ED Notes (Signed)
Pt reports substernal chest heaviness that began approx 2100 this evening, pt states the pain goes through to her back in between her shoulder blades, pt also admits to some left arm pain. Pt denies any diaphoresis, n/v, or shortness of breath at present. Pt was recently wearing a holter monitor d/t palpitations and had a stress today. Pt is A&Ox4, no acute distress, resp even and unlabored.

## 2013-06-10 NOTE — Progress Notes (Signed)
Stress Lab Nurses Notes - [facility]  KRISLYNN GRONAU 06/10/2013 Reason for doing test: Palpitation & Atrial Fib Type of test: Stress Echo Nurse performing test: Parke Poisson, RN Nuclear Medicine Tech: Not Applicable Echo Tech: Karrie Doffing MD performing test: S. McDowell/M.Geni Bers PA Family MD: Juanetta Gosling Test explained and consent signed: yes IV started: No IV started Symptoms: SOB & fatigue Treatment/Intervention: None Reason test stopped: fatigue and SOB After recovery IV was: NA Patient to return to Nuc. Med at : NA Patient discharged: Home Patient's Condition upon discharge was: stable Comments: During test peak BP 183/70 & HR 131.  Recovery BP 131/68 & HR 70.  Symptoms resolved in recovery.  Erskine Speed T

## 2013-06-11 ENCOUNTER — Encounter (HOSPITAL_COMMUNITY): Payer: Self-pay

## 2013-06-11 DIAGNOSIS — R002 Palpitations: Secondary | ICD-10-CM

## 2013-06-11 DIAGNOSIS — I495 Sick sinus syndrome: Secondary | ICD-10-CM | POA: Diagnosis present

## 2013-06-11 DIAGNOSIS — I498 Other specified cardiac arrhythmias: Secondary | ICD-10-CM

## 2013-06-11 DIAGNOSIS — R001 Bradycardia, unspecified: Secondary | ICD-10-CM | POA: Diagnosis present

## 2013-06-11 DIAGNOSIS — E119 Type 2 diabetes mellitus without complications: Secondary | ICD-10-CM

## 2013-06-11 DIAGNOSIS — I4891 Unspecified atrial fibrillation: Secondary | ICD-10-CM | POA: Diagnosis present

## 2013-06-11 DIAGNOSIS — I10 Essential (primary) hypertension: Secondary | ICD-10-CM

## 2013-06-11 LAB — GLUCOSE, CAPILLARY: Glucose-Capillary: 127 mg/dL — ABNORMAL HIGH (ref 70–99)

## 2013-06-11 LAB — CBC
HCT: 39.1 % (ref 36.0–46.0)
Hemoglobin: 13.6 g/dL (ref 12.0–15.0)
Platelets: 192 10*3/uL (ref 150–400)
RDW: 12.2 % (ref 11.5–15.5)
WBC: 8.6 10*3/uL (ref 4.0–10.5)

## 2013-06-11 LAB — BASIC METABOLIC PANEL
BUN: 16 mg/dL (ref 6–23)
Calcium: 9.3 mg/dL (ref 8.4–10.5)
Chloride: 99 mEq/L (ref 96–112)
GFR calc Af Amer: >90 mL/min (ref >90)
Glucose, Bld: 130 mg/dL — ABNORMAL HIGH (ref 70–99)
Potassium: 3.6 mEq/L (ref 3.5–5.1)
Sodium: 135 mEq/L (ref 135–145)

## 2013-06-11 LAB — TSH: TSH: 1.747 u[IU]/mL (ref 0.350–4.500)

## 2013-06-11 MED ORDER — PANTOPRAZOLE SODIUM 40 MG PO TBEC
40.0000 mg | DELAYED_RELEASE_TABLET | Freq: Once | ORAL | Status: AC
  Administered 2013-06-11: 40 mg via ORAL
  Filled 2013-06-11: qty 1

## 2013-06-11 MED ORDER — FLECAINIDE ACETATE 100 MG PO TABS
50.0000 mg | ORAL_TABLET | Freq: Two times a day (BID) | ORAL | Status: DC
  Administered 2013-06-11 – 2013-06-13 (×4): 50 mg via ORAL
  Filled 2013-06-11 (×7): qty 1

## 2013-06-11 MED ORDER — SODIUM CHLORIDE 0.9 % IJ SOLN
3.0000 mL | Freq: Two times a day (BID) | INTRAMUSCULAR | Status: DC
  Administered 2013-06-11 – 2013-06-12 (×3): 3 mL via INTRAVENOUS

## 2013-06-11 MED ORDER — LORATADINE 10 MG PO TABS: 10.0000 mg | ORAL_TABLET | Freq: Every day | ORAL | Status: DC | PRN

## 2013-06-11 MED ORDER — METOPROLOL SUCCINATE ER 25 MG PO TB24
12.5000 mg | ORAL_TABLET | Freq: Every day | ORAL | Status: DC
  Administered 2013-06-11: 12.5 mg via ORAL

## 2013-06-11 MED ORDER — SODIUM CHLORIDE 0.9 % IJ SOLN
3.0000 mL | Freq: Two times a day (BID) | INTRAMUSCULAR | Status: DC
  Administered 2013-06-11: 3 mL via INTRAVENOUS

## 2013-06-11 MED ORDER — VITAMIN B-1 100 MG PO TABS
100.0000 mg | ORAL_TABLET | Freq: Every day | ORAL | Status: DC
  Administered 2013-06-11 – 2013-06-13 (×3): 100 mg via ORAL
  Filled 2013-06-11 (×3): qty 1

## 2013-06-11 MED ORDER — SIMVASTATIN 20 MG PO TABS
40.0000 mg | ORAL_TABLET | Freq: Every day | ORAL | Status: DC
  Administered 2013-06-11 – 2013-06-12 (×2): 40 mg via ORAL
  Filled 2013-06-11 (×2): qty 2

## 2013-06-11 MED ORDER — APIXABAN 5 MG PO TABS
5.0000 mg | ORAL_TABLET | Freq: Two times a day (BID) | ORAL | Status: DC
  Administered 2013-06-11 – 2013-06-13 (×5): 5 mg via ORAL
  Filled 2013-06-11 (×7): qty 1

## 2013-06-11 MED ORDER — LISINOPRIL 10 MG PO TABS
10.0000 mg | ORAL_TABLET | Freq: Every day | ORAL | Status: DC
  Administered 2013-06-11 – 2013-06-12 (×2): 10 mg via ORAL
  Filled 2013-06-11 (×2): qty 1

## 2013-06-11 MED ORDER — METOPROLOL SUCCINATE ER 25 MG PO TB24
12.5000 mg | ORAL_TABLET | Freq: Every day | ORAL | Status: DC
  Filled 2013-06-11: qty 1

## 2013-06-11 MED ORDER — SODIUM CHLORIDE 0.9 % IJ SOLN
3.0000 mL | INTRAMUSCULAR | Status: DC | PRN
  Administered 2013-06-11: 3 mL via INTRAVENOUS

## 2013-06-11 MED ORDER — SODIUM CHLORIDE 0.9 % IV SOLN: 250.0000 mL | INTRAVENOUS | Status: DC | PRN

## 2013-06-11 MED ORDER — HYDROCHLOROTHIAZIDE 25 MG PO TABS
25.0000 mg | ORAL_TABLET | Freq: Every day | ORAL | Status: DC
  Administered 2013-06-11 – 2013-06-12 (×2): 25 mg via ORAL
  Filled 2013-06-11 (×2): qty 1

## 2013-06-11 MED ORDER — BIOTENE DRY MOUTH MT LIQD: 15.0000 mL | OROMUCOSAL | Status: DC | PRN

## 2013-06-11 MED ORDER — PANTOPRAZOLE SODIUM 40 MG PO TBEC
40.0000 mg | DELAYED_RELEASE_TABLET | Freq: Every day | ORAL | Status: DC
  Administered 2013-06-12 – 2013-06-13 (×2): 40 mg via ORAL
  Filled 2013-06-11 (×2): qty 1

## 2013-06-11 NOTE — Progress Notes (Signed)
Inpatient Diabetes Program Recommendations  AACE/ADA: New Consensus Statement on Inpatient Glycemic Control  Target Ranges:  Prepandial:   less than 140 mg/dL      Peak postprandial:   less than 180 mg/dL (1-2 hours)      Critically ill patients:  140 - 180 mg/dL  Pager:  [phone] Hours:  8 am-10pm   Reason for Visit: History of Diabetes. Results for Erika Lee, Erika Lee (MRN [id number]) as of 06/11/2013 08:54  Ref. Range 06/10/2013 22:48 06/11/2013 05:11  Glucose Latest Range: 70-99 mg/dL 130 (H) 865 (H)     Inpatient Diabetes Program Recommendations Correction (SSI): Check CBG's ACHS; if continues to be greater than 140 mg/dL add Novolog Correction  Alfredia Client PhD, RN, BC-ADM Diabetes Coordinator  Office:  [phone] Team Pager:  [phone]

## 2013-06-11 NOTE — Progress Notes (Signed)
Pt. Stated that she was feeling the palpitations again and they were similar to what she felt at home.  At the time of her palpitations, her heart rate was 50-55.  Pt. Also states that she has a slight pain between her shoulder blades.  Notified MD.    Larose Hires, Leta Jungling R 06/11/2013

## 2013-06-11 NOTE — Progress Notes (Signed)
Subjective: She came to the emergency department with palpitations and feeling like she would pass out. Apparently she had a stress test on the day of admission and did not take her metoprolol because of having a stress test. She started having symptoms one half of the metoprolol but continued having symptoms and came to the emergency department. She was found to be in atrial fibrillation with rapid ventricular response. Her heart rate got as high as 230 by history and she felt like she was going to lose consciousness when that happened. She was given intravenous diltiazem and she converted to sinus rhythm. She says she feels okay but is worried about this happening again. She has not had any definite chest pain but did have shortness of breath.  Objective: Vital signs in last 24 hours: Temp:  [97.8 F (36.6 C)-98.2 F (36.8 C)] 97.9 F (36.6 C) (12/12 0653) Pulse Rate:  [44-70] 51 (12/12 0653) Resp:  [13-29] 17 (12/12 0052) BP: (95-123)/(50-88) 95/51 mmHg (12/12 0653) SpO2:  [92 %-100 %] 96 % (12/12 0653) Weight:  [77.111 kg (170 lb)-78.5 kg (173 lb 1 oz)] 78.5 kg (173 lb 1 oz) (12/12 0127) Weight change:     Intake/Output from previous day:    PHYSICAL EXAM General appearance: alert, cooperative and no distress Resp: clear to auscultation bilaterally Cardio: regular rate and rhythm, S1, S2 normal, no murmur, click, rub or gallop GI: soft, non-tender; bowel sounds normal; no masses,  no organomegaly Extremities: extremities normal, atraumatic, no cyanosis or edema  Lab Results:    Basic Metabolic Panel:  Recent Labs  16/10/96 2248 06/11/13 0511  NA 135 135  K 3.4* 3.6  CL 95* 99  CO2 23 25  GLUCOSE 236* 130*  BUN 20 16  CREATININE 0.90 0.78  CALCIUM 9.8 9.3   Liver Function Tests: No results found for this basename: AST, ALT, ALKPHOS, BILITOT, PROT, ALBUMIN,  in the last 72 hours No results found for this basename: LIPASE, AMYLASE,  in the last 72 hours No results  found for this basename: AMMONIA,  in the last 72 hours CBC:  Recent Labs  06/10/13 2248 06/11/13 0511  WBC 10.0 8.6  HGB 15.2* 13.6  HCT 43.0 39.1  MCV 85.5 86.1  PLT 201 192   Cardiac Enzymes:  Recent Labs  06/10/13 2248  TROPONINI <0.30   BNP: No results found for this basename: PROBNP,  in the last 72 hours D-Dimer: No results found for this basename: DDIMER,  in the last 72 hours CBG: No results found for this basename: GLUCAP,  in the last 72 hours Hemoglobin A1C: No results found for this basename: HGBA1C,  in the last 72 hours Fasting Lipid Panel: No results found for this basename: CHOL, HDL, LDLCALC, TRIG, CHOLHDL, LDLDIRECT,  in the last 72 hours Thyroid Function Tests: No results found for this basename: TSH, T4TOTAL, FREET4, T3FREE, THYROIDAB,  in the last 72 hours Anemia Panel: No results found for this basename: VITAMINB12, FOLATE, FERRITIN, TIBC, IRON, RETICCTPCT,  in the last 72 hours Coagulation: No results found for this basename: LABPROT, INR,  in the last 72 hours Urine Drug Screen: Drugs of Abuse  No results found for this basename: labopia, cocainscrnur, labbenz, amphetmu, thcu, labbarb    Alcohol Level: No results found for this basename: ETH,  in the last 72 hours Urinalysis: No results found for this basename: COLORURINE, APPERANCEUR, LABSPEC, PHURINE, GLUCOSEU, HGBUR, BILIRUBINUR, KETONESUR, PROTEINUR, UROBILINOGEN, NITRITE, LEUKOCYTESUR,  in the last 72 hours Misc.  Labs:  ABGS No results found for this basename: PHART, PCO2, PO2ART, TCO2, HCO3,  in the last 72 hours CULTURES No results found for this or any previous visit (from the past 240 hour(s)). Studies/Results: No results found.  Medications:  Prior to Admission:  Prescriptions prior to admission  Medication Sig Dispense Refill  . aspirin 81 MG EC tablet Take 81 mg by mouth daily.        Marland Kitchen esomeprazole (NEXIUM) 40 MG capsule Take 40 mg by mouth daily before breakfast.         . hydrochlorothiazide 25 MG tablet Take 25 mg by mouth daily.        Marland Kitchen HYDROcodone-acetaminophen (LORTAB) 7.5-500 MG per tablet Take 1 tablet by mouth every 6 (six) hours as needed. For pain  30 tablet  0  . lisinopril (PRINIVIL,ZESTRIL) 10 MG tablet Take 10 mg by mouth daily.        Marland Kitchen loratadine (CLARITIN) 10 MG tablet Take 10 mg by mouth daily as needed for allergies.      . metFORMIN (GLUCOPHAGE) 500 MG tablet Take 500 mg by mouth daily.      . metoprolol succinate (TOPROL XL) 25 MG 24 hr tablet Take 0.5 tablets (12.5 mg total) by mouth daily.  60 tablet  3  . Omega-3 Fatty Acids (FISH OIL) 1000 MG CAPS Take 1,000 mg by mouth daily.       . Potassium Gluconate 550 MG TABS Take 1 tablet by mouth daily.      . simvastatin (ZOCOR) 40 MG tablet Take 40 mg by mouth at bedtime.        . thiamine (VITAMIN B-1) 100 MG tablet Take 100 mg by mouth daily.       Scheduled: . hydrochlorothiazide  25 mg Oral Daily  . lisinopril  10 mg Oral Daily  . metoprolol succinate  12.5 mg Oral Daily  . simvastatin  40 mg Oral QHS  . sodium chloride  3 mL Intravenous Q12H  . sodium chloride  3 mL Intravenous Q12H  . thiamine  100 mg Oral Daily   Continuous:  ZOX:WRUEAV chloride, loratadine, sodium chloride  Assesment: She has been having palpitations which had been worked up but without a definite cause being found. She came to the emergency department with atrial fibrillation with rapid ventricular response. She is back in sinus rhythm. Principal Problem:   Palpitations Active Problems:   Essential hypertension, benign   Atrial fibrillation   Sinus bradycardia   Atrial fibrillation with rapid ventricular response   Tachycardia-bradycardia    Plan: I will request cardiology consultation. I don't know of metoprolol is going to be enough to keep her in sinus rhythm and also I think she probably will need to be anticoagulated but would like to get cardiology opinion    LOS: 1 day   ,  L 06/11/2013, 8:54 AM

## 2013-06-11 NOTE — H&P (Signed)
PCP:   Fredirick Maudlin, MD   Chief Complaint:  palpitations  HPI: 67 yo female with palpitations and felt like she was going to pass out.  She has had this happen several times over the last several months, just wore a heart monitor for 3 weeks but no episodes happened during the monitor.  Had a stress test this am was neg.  Today in ED she was initially in afib with rvr rate in teh 140s then at some point she had a rate of over 200.  Was given a bolus of cardizem and then her rate dropped in the 40's soon after getting cardizem and converted to nsr.  shes now in nsr and feeling back to normal.  No loc during this.  No recent illnessess.  pcp dr. Juanetta Gosling.  Review of Systems:  Positive and negative as per HPI otherwise all other systems are negative  Past Medical History: Past Medical History  Diagnosis Date  . Hyperlipidemia   . Multinodular goiter   . Atrial fibrillation     Documented 2008, spontaneously converted  . GERD (gastroesophageal reflux disease)   . Essential hypertension, benign   . Palpitations   . Diabetes mellitus     Borderline  . Arthritis    Past Surgical History  Procedure Laterality Date  . Biopsy thyroid    . Tonsillectomy    . Colonoscopy  09/10/2011    Procedure: COLONOSCOPY;  Surgeon: Dalia Heading, MD;  Location: [facility];  Service: Gastroenterology;  Laterality: N/A;  . Lesion removal N/A 05/03/2013    Procedure: EXCISION CYST, BACK;  Surgeon: Dalia Heading, MD;  Location: [facility];  Service: General;  Laterality: N/A;    Medications: Prior to Admission medications   Medication Sig Start Date End Date Taking? Authorizing Provider  aspirin 81 MG EC tablet Take 81 mg by mouth daily.     Yes Historical Provider, MD  esomeprazole (NEXIUM) 40 MG capsule Take 40 mg by mouth daily before breakfast.     Yes Historical Provider, MD  hydrochlorothiazide 25 MG tablet Take 25 mg by mouth daily.     Yes Historical Provider, MD  HYDROcodone-acetaminophen  (LORTAB) 7.5-500 MG per tablet Take 1 tablet by mouth every 6 (six) hours as needed. For pain 05/03/13  Yes Dalia Heading, MD  lisinopril (PRINIVIL,ZESTRIL) 10 MG tablet Take 10 mg by mouth daily.     Yes Historical Provider, MD  loratadine (CLARITIN) 10 MG tablet Take 10 mg by mouth daily as needed for allergies.   Yes Historical Provider, MD  metFORMIN (GLUCOPHAGE) 500 MG tablet Take 500 mg by mouth daily.   Yes Historical Provider, MD  metoprolol succinate (TOPROL XL) 25 MG 24 hr tablet Take 0.5 tablets (12.5 mg total) by mouth daily. 05/25/13  Yes Jonelle Sidle, MD  Omega-3 Fatty Acids (FISH OIL) 1000 MG CAPS Take 1,000 mg by mouth daily.    Yes Historical Provider, MD  Potassium Gluconate 550 MG TABS Take 1 tablet by mouth daily.   Yes Historical Provider, MD  simvastatin (ZOCOR) 40 MG tablet Take 40 mg by mouth at bedtime.     Yes Historical Provider, MD  thiamine (VITAMIN B-1) 100 MG tablet Take 100 mg by mouth daily.   Yes Historical Provider, MD    Allergies:   Allergies  Allergen Reactions  . Doxycycline Other (See Comments)    Makes heart race  . Sulfonamide Derivatives Nausea And Vomiting  . Adhesive [Tape] Rash  .  Clindamycin/Lincomycin Rash    Social History:  reports that she quit smoking about 19 years ago. Her smoking use included Cigarettes. She smoked 0.00 packs per day. She has never used smokeless tobacco. She reports that she does not drink alcohol or use illicit drugs.  Family History: Family History  Problem Relation Age of Onset  . Stroke Mother     Physical Exam: Filed Vitals:   06/10/13 2300 06/10/13 2303 06/10/13 2320 06/10/13 2322  BP: 107/72 102/88 100/60   Pulse: 50 44 64 64  Temp:      TempSrc:      Resp: 13 29 18 16   Height:      Weight:      SpO2: 99% 92% 100% 100%   General appearance: alert, cooperative and no distress Head: Normocephalic, without obvious abnormality, atraumatic Eyes: negative Nose: Nares normal. Septum midline.  Mucosa normal. No drainage or sinus tenderness. Neck: no JVD and supple, symmetrical, trachea midline Lungs: clear to auscultation bilaterally Heart: regular rate and rhythm, S1, S2 normal, no murmur, click, rub or gallop Abdomen: soft, non-tender; bowel sounds normal; no masses,  no organomegaly Extremities: extremities normal, atraumatic, no cyanosis or edema Pulses: 2+ and symmetric Skin: Skin color, texture, turgor normal. No rashes or lesions Neurologic: Grossly normal    Labs on Admission:   Recent Labs  06/10/13 2248  NA 135  K 3.4*  CL 95*  CO2 23  GLUCOSE 236*  BUN 20  CREATININE 0.90  CALCIUM 9.8    Recent Labs  06/10/13 2248  WBC 10.0  HGB 15.2*  HCT 43.0  MCV 85.5  PLT 201    Recent Labs  06/10/13 2248  TROPONINI <0.30   Radiological Exams on Admission: No results found.  Assessment/Plan  67 yo female with tachy brady arrythmia  Principal Problem:   Palpitations-  Place on tele.  Cardiology consult tomorrow.  Active Problems:   Essential hypertension, benign   Atrial fibrillation   Sinus bradycardia  pcp is dr Juanetta Gosling.  , A 06/11/2013, 12:32 AM

## 2013-06-11 NOTE — Consult Note (Signed)
CARDIOLOGY CONSULT NOTE   Patient ID: KABRINA CHRISTIANO MRN: [id number] DOB/AGE: 1946-05-03 67 y.o.  Admit Date: 06/10/2013 Referring Physician: Kari Baars MD Primary Physician: Fredirick Maudlin, MD Consulting Cardiologist: Dr. Diona Browner Primary Cardiologist : Dr. Diona Browner Reason for Consultation: Paroxysmal atrial fibrillation with RVR  Clinical Summary Ms. Borkowski is a 67 y.o.female admitted with recurrent, symptomatic atrial fibrillation with RVR. She has a history of same, was seen by Dr. Diona Browner back in mid November, had a cardiac monitor that confirmed atrial fibrillation. She just recently underwent an exercise echocardiogram to exclude ischemia (negative). She spontaneously converted to sinus rhythm after bolus of diltiazem with bradycardic response to 40 bpm. She had held her metoprolol XL on the morning of her stress echocardiogram. Report demonstrated no echocardiographic evidence for stress-induced ischemia, sinus tachycardia with frequent PAC's and PVC's without sustained atrial fibrillation.   She is currently without complaints, without recurrent heart racing. She is anxious, "I want something done, so I don't have to go through this again."  CHADSVASC score is 4.   Allergies  Allergen Reactions  . Doxycycline Other (See Comments)    Makes heart race  . Sulfonamide Derivatives Nausea And Vomiting  . Adhesive [Tape] Rash  . Clindamycin/Lincomycin Rash    Medications Scheduled Medications: . hydrochlorothiazide  25 mg Oral Daily  . lisinopril  10 mg Oral Daily  . metoprolol succinate  12.5 mg Oral Daily  . simvastatin  40 mg Oral QHS  . sodium chloride  3 mL Intravenous Q12H  . sodium chloride  3 mL Intravenous Q12H  . thiamine  100 mg Oral Daily      PRN Medications: sodium chloride, loratadine, sodium chloride   Past Medical History  Diagnosis Date  . Hyperlipidemia   . Multinodular goiter   . Atrial fibrillation     Documented 2008,  spontaneously converted  . GERD (gastroesophageal reflux disease)   . Essential hypertension, benign   . Palpitations   . Diabetes mellitus     Borderline  . Arthritis     Past Surgical History  Procedure Laterality Date  . Biopsy thyroid    . Tonsillectomy    . Colonoscopy  09/10/2011    Procedure: COLONOSCOPY;  Surgeon: Dalia Heading, MD;  Location: [facility];  Service: Gastroenterology;  Laterality: N/A;  . Lesion removal N/A 05/03/2013    Procedure: EXCISION CYST, BACK;  Surgeon: Dalia Heading, MD;  Location: [facility];  Service: General;  Laterality: N/A;    Family History  Problem Relation Age of Onset  . Stroke Mother     Social History Ms. Roskelley reports that she quit smoking about 19 years ago. Her smoking use included Cigarettes. She smoked 0.00 packs per day. She has never used smokeless tobacco. Ms. Stormes reports that she does not drink alcohol.  Review of Systems Otherwise reviewed and negative except as outlined.  Physical Examination Blood pressure 95/51, pulse 51, temperature 97.9 F (36.6 C), temperature source Oral, resp. rate 17, height 5\' 4"  (1.626 m), weight 173 lb 1 oz (78.5 kg), SpO2 96.00%.  Intake/Output Summary (Last 24 hours) at 06/11/13 1039 Last data filed at 06/11/13 0838  Gross per 24 hour  Intake    240 ml  Output      0 ml  Net    240 ml    Telemetry: NSR sinus bradycardia  Appears comfortable. HEENT: Conjunctiva and lids normal, oropharynx clear with moist mucosa. Neck: Supple, no elevated JVP or carotid bruits,  no thyromegaly. Lungs: Clear to auscultation, nonlabored breathing at rest. Cardiac: Regular rate and rhythm, no S3 or significant systolic murmur, no pericardial rub. Abdomen: Soft, nontender, no hepatomegaly, bowel sounds present, no guarding or rebound. Extremities: No pitting edema, distal pulses 2+. Skin: Warm and dry. Musculoskeletal: No kyphosis. Neuropsychiatric: Alert and oriented x3, affect grossly  appropriate.   Prior Cardiac Testing/Procedures 1. Stress Echocardiogram 12.11.2014 Stress ECG conclusions: Sinus tachycardia. Occasional to frequent PACs and PVCs, no sustained atrial fibrillation. The stress ECG was negative for ischemia. Duke scoring: exercise time of ; maximum ST deviation of 0mm; no angina; resulting score is 5. This score predicts a mild to moderate risk of cardiac events - based mainly on exercise time. - Staged echo: There was no echocardiographic evidence for stress-induced ischemia.   Lab Results  Basic Metabolic Panel:  Recent Labs Lab 06/10/13 2248 06/11/13 0511  NA 135 135  K 3.4* 3.6  CL 95* 99  CO2 23 25  GLUCOSE 236* 130*  BUN 20 16  CREATININE 0.90 0.78  CALCIUM 9.8 9.3   CBC:  Recent Labs Lab 06/10/13 2248 06/11/13 0511  WBC 10.0 8.6  HGB 15.2* 13.6  HCT 43.0 39.1  MCV 85.5 86.1  PLT 201 192    Cardiac Enzymes:  Recent Labs Lab 06/10/13 2248  TROPONINI <0.30     ECG: Sinus bradycardia with blocked PAC's. Rate of 56 bpm.   Impression and Recommendations:  1. PAF: Symptomatic with RVR. Converted to sinus bradycardia after bolus of diltiazem. She has had documented atrial fib per cardiac monitor as OP. Frequent atrial and ventricular ectopy per stress echo on 12.11.2014 with no evidence of ischemia on echo. She was symptomatic with rapid HR with near syncope, and profound weakness. CHADSVASC score of 4. Consider adding anticoagulant agent. Check TSH  2. Hypertension: Mildly hypotensive at this time, asymptomatic. Lisinopril is being held.  3. Diabetes: Currently controlled.   Signed: Bettey Mare. Lyman Bishop NP [facility] 06/11/2013, 10:39 AM Co-Sign MD  Attending note:  Patient seen and examined. Modified above note by Ms. Lawrence NP. Ms. Shin presents with an episode of symptomatic atrial fibrillation with RVR. This has been documented recently since office visit in November, previously had not  had significant episodes since 2008. She has recently been on low-dose beta blocker, held for exercise echocardiogram yesterday which did not demonstrate any active ischemia. Her CHADSVASC score is 4. She spontaneously converted to sinus bradycardia after Cardizem bolus, feels better today. Troponin I is negative.  We have discussed options for management of symptomatic PAF, including antiarrhythmic treatment and also indications for anticoagulation. After discussion, our plan is to initiate Eliquis 5 mg BID, currently stop aspirin, continue Toprol-XL at 12.5 mg daily, and initiate flecainide beginning at 50 mg BID. Would observe today, potential discharge home tomorrow. We will schedule an office followup over the next few weeks. She will need to have a followup GXT on full medical therapy to rule out arrhythmia eventually. We can take care of this at our followup.  Jonelle Sidle, M.D., F.A.C.C.

## 2013-06-12 DIAGNOSIS — R000 Tachycardia, unspecified: Secondary | ICD-10-CM | POA: Diagnosis present

## 2013-06-12 LAB — GLUCOSE, CAPILLARY
Glucose-Capillary: 204 mg/dL — ABNORMAL HIGH (ref 70–99)
Glucose-Capillary: 170 mg/dL — ABNORMAL HIGH (ref 70–99)

## 2013-06-12 MED ORDER — POTASSIUM CHLORIDE CRYS ER 20 MEQ PO TBCR
20.0000 meq | EXTENDED_RELEASE_TABLET | Freq: Two times a day (BID) | ORAL | Status: DC
  Administered 2013-06-12 – 2013-06-13 (×3): 20 meq via ORAL
  Filled 2013-06-12 (×3): qty 1

## 2013-06-12 MED ORDER — METFORMIN HCL 500 MG PO TABS
500.0000 mg | ORAL_TABLET | Freq: Every day | ORAL | Status: DC
  Administered 2013-06-12 – 2013-06-13 (×2): 500 mg via ORAL
  Filled 2013-06-12 (×2): qty 1

## 2013-06-12 NOTE — Progress Notes (Signed)
Subjective: She has had more palpitations. She had diarrhea this morning. Her blood sugar is up.  Objective: Vital signs in last 24 hours: Temp:  [97.4 F (36.3 C)-97.9 F (36.6 C)] 97.8 F (36.6 C) (12/13 0435) Pulse Rate:  [44-60] 50 (12/13 1015) Resp:  [20] 20 (12/13 0435) BP: (102-125)/(63-74) 110/69 mmHg (12/13 0435) SpO2:  [94 %-99 %] 96 % (12/13 0435) Weight change:  Last BM Date: 06/10/13  Intake/Output from previous day: 12/12 0701 - 12/13 0700 In: 363 [P.O.:360; I.V.:3] Out: -   PHYSICAL EXAM General appearance: alert, cooperative and mild distress Resp: clear to auscultation bilaterally Cardio: regular rate and rhythm, S1, S2 normal, no murmur, click, rub or gallop GI: soft, non-tender; bowel sounds normal; no masses,  no organomegaly Extremities: extremities normal, atraumatic, no cyanosis or edema  Lab Results:    Basic Metabolic Panel:  Recent Labs  45/40/98 2248 06/11/13 0511  NA 135 135  K 3.4* 3.6  CL 95* 99  CO2 23 25  GLUCOSE 236* 130*  BUN 20 16  CREATININE 0.90 0.78  CALCIUM 9.8 9.3   Liver Function Tests: No results found for this basename: AST, ALT, ALKPHOS, BILITOT, PROT, ALBUMIN,  in the last 72 hours No results found for this basename: LIPASE, AMYLASE,  in the last 72 hours No results found for this basename: AMMONIA,  in the last 72 hours CBC:  Recent Labs  06/10/13 2248 06/11/13 0511  WBC 10.0 8.6  HGB 15.2* 13.6  HCT 43.0 39.1  MCV 85.5 86.1  PLT 201 192   Cardiac Enzymes:  Recent Labs  06/10/13 2248  TROPONINI <0.30   BNP: No results found for this basename: PROBNP,  in the last 72 hours D-Dimer: No results found for this basename: DDIMER,  in the last 72 hours CBG:  Recent Labs  06/11/13 1138 06/12/13 1012  GLUCAP 127* 204*   Hemoglobin A1C: No results found for this basename: HGBA1C,  in the last 72 hours Fasting Lipid Panel: No results found for this basename: CHOL, HDL, LDLCALC, TRIG, CHOLHDL,  LDLDIRECT,  in the last 72 hours Thyroid Function Tests:  Recent Labs  06/10/13 2344  TSH 1.747   Anemia Panel: No results found for this basename: VITAMINB12, FOLATE, FERRITIN, TIBC, IRON, RETICCTPCT,  in the last 72 hours Coagulation: No results found for this basename: LABPROT, INR,  in the last 72 hours Urine Drug Screen: Drugs of Abuse  No results found for this basename: labopia, cocainscrnur, labbenz, amphetmu, thcu, labbarb    Alcohol Level: No results found for this basename: ETH,  in the last 72 hours Urinalysis: No results found for this basename: COLORURINE, APPERANCEUR, LABSPEC, PHURINE, GLUCOSEU, HGBUR, BILIRUBINUR, KETONESUR, PROTEINUR, UROBILINOGEN, NITRITE, LEUKOCYTESUR,  in the last 72 hours Misc. Labs:  ABGS No results found for this basename: PHART, PCO2, PO2ART, TCO2, HCO3,  in the last 72 hours CULTURES No results found for this or any previous visit (from the past 240 hour(s)). Studies/Results: No results found.  Medications:  Prior to Admission:  Prescriptions prior to admission  Medication Sig Dispense Refill  . esomeprazole (NEXIUM) 40 MG capsule Take 40 mg by mouth daily before breakfast.        . hydrochlorothiazide 25 MG tablet Take 25 mg by mouth daily.        Marland Kitchen HYDROcodone-acetaminophen (LORTAB) 7.5-500 MG per tablet Take 1 tablet by mouth every 6 (six) hours as needed. For pain  30 tablet  0  . lisinopril (PRINIVIL,ZESTRIL) 10 MG  tablet Take 10 mg by mouth daily.        Marland Kitchen loratadine (CLARITIN) 10 MG tablet Take 10 mg by mouth daily as needed for allergies.      . metFORMIN (GLUCOPHAGE) 500 MG tablet Take 500 mg by mouth daily.      . metoprolol succinate (TOPROL XL) 25 MG 24 hr tablet Take 0.5 tablets (12.5 mg total) by mouth daily.  60 tablet  3  . Omega-3 Fatty Acids (FISH OIL) 1000 MG CAPS Take 1,000 mg by mouth daily.       . Potassium Gluconate 550 MG TABS Take 1 tablet by mouth daily.      . simvastatin (ZOCOR) 40 MG tablet Take 40 mg by  mouth at bedtime.        . thiamine (VITAMIN B-1) 100 MG tablet Take 100 mg by mouth daily.      Marland Kitchen aspirin 81 MG EC tablet Take 81 mg by mouth daily.         Scheduled: . apixaban  5 mg Oral BID  . flecainide  50 mg Oral Q12H  . hydrochlorothiazide  25 mg Oral Daily  . lisinopril  10 mg Oral Daily  . metFORMIN  500 mg Oral Q breakfast  . metoprolol succinate  12.5 mg Oral Daily  . pantoprazole  40 mg Oral Daily  . potassium chloride  20 mEq Oral BID  . simvastatin  40 mg Oral QHS  . sodium chloride  3 mL Intravenous Q12H  . thiamine  100 mg Oral Daily   Continuous:  ZOX:WRUEAV chloride, loratadine, sodium chloride  Assesment: She was admitted with atrial fibrillation with rapid ventricular response. She's had some bradycardia after treatment. She had palpitations yesterday but I can't find an area that clearly shows atrial fibrillation. She has some diarrhea this morning I suspect from treatment for her atrial fib she has elevated blood sugar. Principal Problem:   Palpitations Active Problems:   Essential hypertension, benign   Atrial fibrillation   Sinus bradycardia   Atrial fibrillation with rapid ventricular response   Tachycardia-bradycardia    Plan: I don't think she's ready for discharge. I want to give her a little bit more potassium. Continue everything else repeat her basic metabolic profile in the morning and follow    LOS: 2 days   , L 06/12/2013, 10:40 AM

## 2013-06-12 NOTE — Care Management Utilization Note (Signed)
UR complete     ,MSN,RN [id number] 

## 2013-06-12 NOTE — Discharge Instructions (Signed)
Information on my medicine - ELIQUIS® (apixaban) ° °This medication education was reviewed with me or my healthcare representative as part of my discharge preparation.  The pharmacist that spoke with me during my hospital stay was:  ,  R, RPH ° °Why was Eliquis® prescribed for you? °Eliquis® was prescribed for you to reduce the risk of forming blood clots that can cause a stroke if you have a medical condition called atrial fibrillation (a type of irregular heartbeat) OR to reduce the risk of a blood clots forming after orthopedic surgery. ° °What do You need to know about Eliquis® ? °Take your Eliquis® TWICE DAILY - one tablet in the morning and one tablet in the evening with or without food.  It would be best to take the doses about the same time each day. ° °If you have difficulty swallowing the tablet whole please discuss with your pharmacist how to take the medication safely. ° °Take Eliquis® exactly as prescribed by your doctor and DO NOT stop taking Eliquis® without talking to the doctor who prescribed the medication.  Stopping may increase your risk of developing a new clot or stroke.  Refill your prescription before you run out. ° °After discharge, you should have regular check-up appointments with your healthcare provider that is prescribing your Eliquis®.  In the future your dose may need to be changed if your kidney function or weight changes by a significant amount or as you get older. ° °What do you do if you miss a dose? °If you miss a dose, take it as soon as you remember on the same day and resume taking twice daily.  Do not take more than one dose of ELIQUIS at the same time. ° °Important Safety Information °A possible side effect of Eliquis® is bleeding. You should call your healthcare provider right away if you experience any of the following: °? Bleeding from an injury or your nose that does not stop. °? Unusual colored urine (red or dark brown) or unusual colored stools (red or  black). °? Unusual bruising for unknown reasons. °? A serious fall or if you hit your head (even if there is no bleeding). ° °Some medicines may interact with Eliquis® and might increase your risk of bleeding or clotting while on Eliquis®. To help avoid this, consult your healthcare provider or pharmacist prior to using any new prescription or non-prescription medications, including herbals, vitamins, non-steroidal anti-inflammatory drugs (NSAIDs) and supplements. ° °This website has more information on Eliquis® (apixaban): www.Eliquis.com. ° ° ° °

## 2013-06-13 LAB — BASIC METABOLIC PANEL
Calcium: 9.5 mg/dL (ref 8.4–10.5)
Chloride: 96 mEq/L (ref 96–112)
Creatinine, Ser: 0.82 mg/dL (ref 0.50–1.10)
GFR calc Af Amer: 83 mL/min — ABNORMAL LOW (ref >90)
GFR calc non Af Amer: 71 mL/min — ABNORMAL LOW (ref >90)
Glucose, Bld: 163 mg/dL — ABNORMAL HIGH (ref 70–99)

## 2013-06-13 MED ORDER — POTASSIUM GLUCONATE 550 MG PO TABS: 1.0000 | ORAL_TABLET | Freq: Two times a day (BID) | ORAL | Status: DC

## 2013-06-13 MED ORDER — APIXABAN 5 MG PO TABS: 5.0000 mg | ORAL_TABLET | Freq: Two times a day (BID) | ORAL | Status: DC

## 2013-06-13 MED ORDER — FLECAINIDE ACETATE 50 MG PO TABS: 50.0000 mg | ORAL_TABLET | Freq: Two times a day (BID) | ORAL | Status: DC

## 2013-06-13 NOTE — Progress Notes (Signed)
She is significantly better. She has not had any palpitations that have been sustained. She has had some "lightening-like" palpitations. She has not had anymore diarrhea. Her blood sugar is better. She is ready for discharge

## 2013-06-13 NOTE — Discharge Summary (Signed)
Physician Discharge Summary  Patient ID: Erika Lee MRN: [id number] DOB/AGE: [birth year] 67 y.o. Primary Care Physician:, L, MD Admit date: 06/10/2013 Discharge date: 06/13/2013    Discharge Diagnoses:   Principal Problem:   Palpitations Active Problems:   Essential hypertension, benign   Atrial fibrillation   Type 2 diabetes mellitus   Sinus bradycardia   Atrial fibrillation with rapid ventricular response   Tachycardia-bradycardia   Tachycardia     Medication List         apixaban 5 MG Tabs tablet  Commonly known as:  ELIQUIS  Take 1 tablet (5 mg total) by mouth 2 (two) times daily.     aspirin 81 MG EC tablet  Take 81 mg by mouth daily.     esomeprazole 40 MG capsule  Commonly known as:  NEXIUM  Take 40 mg by mouth daily before breakfast.     Fish Oil 1000 MG Caps  Take 1,000 mg by mouth daily.     flecainide 50 MG tablet  Commonly known as:  TAMBOCOR  Take 1 tablet (50 mg total) by mouth every 12 (twelve) hours.     hydrochlorothiazide 25 MG tablet  Commonly known as:  HYDRODIURIL  Take 25 mg by mouth daily.     HYDROcodone-acetaminophen 7.5-500 MG per tablet  Commonly known as:  LORTAB  Take 1 tablet by mouth every 6 (six) hours as needed. For pain     lisinopril 10 MG tablet  Commonly known as:  PRINIVIL,ZESTRIL  Take 10 mg by mouth daily.     loratadine 10 MG tablet  Commonly known as:  CLARITIN  Take 10 mg by mouth daily as needed for allergies.     metFORMIN 500 MG tablet  Commonly known as:  GLUCOPHAGE  Take 500 mg by mouth daily.     metoprolol succinate 25 MG 24 hr tablet  Commonly known as:  TOPROL XL  Take 0.5 tablets (12.5 mg total) by mouth daily.     Potassium Gluconate 550 MG Tabs  Take 1 tablet (550 mg total) by mouth 2 (two) times daily.     simvastatin 40 MG tablet  Commonly known as:  ZOCOR  Take 40 mg by mouth at bedtime.     thiamine 100 MG tablet  Commonly known as:  VITAMIN B-1  Take 100 mg by mouth  daily.        Discharged Condition: Improved    Consults: Cardiology, Dr. Diona Browner  Significant Diagnostic Studies: No results found.  Lab Results: Basic Metabolic Panel:  Recent Labs  40/98/11 0511 06/13/13 0606  NA 135 133*  K 3.6 3.6  CL 99 96  CO2 25 25  GLUCOSE 130* 163*  BUN 16 11  CREATININE 0.78 0.82  CALCIUM 9.3 9.5   Liver Function Tests: No results found for this basename: AST, ALT, ALKPHOS, BILITOT, PROT, ALBUMIN,  in the last 72 hours   CBC:  Recent Labs  06/10/13 2248 06/11/13 0511  WBC 10.0 8.6  HGB 15.2* 13.6  HCT 43.0 39.1  MCV 85.5 86.1  PLT 201 192    No results found for this or any previous visit (from the past 240 hour(s)).   Hospital Course: She came to the emergency department because of tachycardia and near syncope. She has been being worked up for palpitations but no obvious cause has been found so far. However when she came to the emergency room she was in atrial fibrillation with rapid ventricular response and her ventricular response  went as high as over 200. She was given a bolus of diltiazem and converted to sinus rhythm and had some bradycardia. Cardiology consultation was obtained and it was felt that she should be continued on metoprolol but started on flecanide. She will also be started on anti-coagulation. She had some diarrhea and still had some palpitations but this cleared. By the time of discharge she had no further palpitations appear to be in sinus rhythm.  Discharge Exam: Blood pressure 94/59, pulse 44, temperature 97.7 F (36.5 C), temperature source Oral, resp. rate 20, height 5\' 4"  (1.626 m), weight 78.5 kg (173 lb 1 oz), SpO2 96.00%. She's awake and alert. She is in sinus rhythm. Her chest is clear. Her heart is regular  Disposition: Home      Discharge Orders   Future Appointments Provider Department Dept Phone   06/14/2013 1:20 PM [facility] [place] [facility] [phone]   Please wear two piece clothing  and wear no powder or deodorant. Please arrive 15 minutes early prior to your appointment time.   06/29/2013 2:50 PM Jodelle Gross, NP [facility] [place] [phone]   Future Orders Complete By Expires   Discharge patient  As directed       Follow-up Information   Follow up with [facility] [place] On 06/29/2013. (at 2:50 pm)    Specialty:  Cardiology   Contact information:   [address] [place] [address] [phone]      Signed: Fredirick Maudlin Pager [phone]  06/13/2013, 10:31 AM

## 2013-06-13 NOTE — Progress Notes (Signed)
Patient received discharge instructions along with follow up appointments and prescriptions. Patient verbalized understanding of all instructions. Patient was escorted by staff via wheelchair to vehicle. Patient discharged to home in stable condition. 

## 2013-06-14 ENCOUNTER — Ambulatory Visit (HOSPITAL_COMMUNITY)
Admission: RE | Admit: 2013-06-14 | Discharge: 2013-06-14 | Disposition: A | Discharge status: Home / Self Care | Payer: Medicare Other | Source: Ambulatory Visit | Attending: Pulmonary Disease | Admitting: Pulmonary Disease

## 2013-06-14 ENCOUNTER — Ambulatory Visit (HOSPITAL_COMMUNITY): Payer: Medicare Other

## 2013-06-14 DIAGNOSIS — Z1231 Encounter for screening mammogram for malignant neoplasm of breast: Secondary | ICD-10-CM | POA: Insufficient documentation

## 2013-06-14 DIAGNOSIS — Z139 Encounter for screening, unspecified: Secondary | ICD-10-CM

## 2013-06-15 ENCOUNTER — Ambulatory Visit: Payer: Medicare Other | Admitting: Cardiology

## 2013-06-16 ENCOUNTER — Ambulatory Visit (INDEPENDENT_AMBULATORY_CARE_PROVIDER_SITE_OTHER): Payer: Self-pay | Admitting: General Surgery

## 2013-06-16 ENCOUNTER — Telehealth: Payer: Self-pay | Admitting: Cardiology

## 2013-06-16 NOTE — Telephone Encounter (Signed)
Question of antibiotics for treatment of lung infection should be directed to Dr. Juanetta Gosling. He can review her current regimen and decide which would be most reasonable.

## 2013-06-16 NOTE — Telephone Encounter (Signed)
Patient states that she was seen by Dr.Hawkins for "lung infection" / pt is allergic to sulfa drugs and states that he doesn't know what to give her that will not interact with her "heart medications" / Please call patient back / tgs

## 2013-06-16 NOTE — Telephone Encounter (Signed)
Spoke to pt to advise results/instructions. Pt understood. Pt will call PCP back after completion of prednisone 6 day pack and no resolve or worsening sxs

## 2013-06-16 NOTE — Telephone Encounter (Signed)
Spoke to pt to advise results/instructions. Pt understood. Pt advised that she was advised by family members that she needs not to take the prednisone per this can speed her heart up, the pharmacy advised she can not take the prescribed z-pack with flecainide per known to cause death, pt questioned Dr Juanetta Gosling and he advised he does not know what to give her that is ok for her to take for the lung infection based on her heart conditions, this nurse called Dr Juanetta Gosling office and spoke with Dr Juanetta Gosling personally that she can take the prednisone with no problems however per pt allergies to other ABT and flecanide use contraindication with Z pack the pt will need to take the prednisone only to see if this will help her as well as time for her body to allow the infection to pass unfortunately this is all she can do at this time and contact Dr Juanetta Gosling if this does not improve, called pt and noted vm will call pt back time permitted

## 2013-06-16 NOTE — Telephone Encounter (Signed)
Please advise question below.

## 2013-06-17 ENCOUNTER — Ambulatory Visit (HOSPITAL_COMMUNITY)
Admission: RE | Admit: 2013-06-17 | Discharge: 2013-06-17 | Disposition: A | Discharge status: Home / Self Care | Payer: Medicare Other | Source: Ambulatory Visit | Attending: Pulmonary Disease | Admitting: Pulmonary Disease

## 2013-06-17 ENCOUNTER — Other Ambulatory Visit (HOSPITAL_COMMUNITY): Payer: Self-pay | Admitting: Pulmonary Disease

## 2013-06-17 DIAGNOSIS — R0602 Shortness of breath: Secondary | ICD-10-CM

## 2013-06-20 ENCOUNTER — Emergency Department (HOSPITAL_COMMUNITY)
Admission: EM | Admit: 2013-06-20 | Discharge: 2013-06-20 | Disposition: A | Discharge status: Home / Self Care | Payer: Medicare Other | Attending: Emergency Medicine | Admitting: Emergency Medicine

## 2013-06-20 ENCOUNTER — Encounter (HOSPITAL_COMMUNITY): Payer: Self-pay | Admitting: Emergency Medicine

## 2013-06-20 ENCOUNTER — Emergency Department (HOSPITAL_COMMUNITY): Payer: Medicare Other

## 2013-06-20 DIAGNOSIS — E785 Hyperlipidemia, unspecified: Secondary | ICD-10-CM | POA: Insufficient documentation

## 2013-06-20 DIAGNOSIS — I10 Essential (primary) hypertension: Secondary | ICD-10-CM | POA: Insufficient documentation

## 2013-06-20 DIAGNOSIS — J069 Acute upper respiratory infection, unspecified: Secondary | ICD-10-CM | POA: Insufficient documentation

## 2013-06-20 DIAGNOSIS — K219 Gastro-esophageal reflux disease without esophagitis: Secondary | ICD-10-CM | POA: Insufficient documentation

## 2013-06-20 DIAGNOSIS — R0789 Other chest pain: Secondary | ICD-10-CM | POA: Insufficient documentation

## 2013-06-20 DIAGNOSIS — M129 Arthropathy, unspecified: Secondary | ICD-10-CM | POA: Insufficient documentation

## 2013-06-20 DIAGNOSIS — Z7982 Long term (current) use of aspirin: Secondary | ICD-10-CM | POA: Insufficient documentation

## 2013-06-20 DIAGNOSIS — Z79899 Other long term (current) drug therapy: Secondary | ICD-10-CM | POA: Insufficient documentation

## 2013-06-20 DIAGNOSIS — E119 Type 2 diabetes mellitus without complications: Secondary | ICD-10-CM | POA: Insufficient documentation

## 2013-06-20 DIAGNOSIS — I4891 Unspecified atrial fibrillation: Secondary | ICD-10-CM | POA: Insufficient documentation

## 2013-06-20 DIAGNOSIS — Z87891 Personal history of nicotine dependence: Secondary | ICD-10-CM | POA: Insufficient documentation

## 2013-06-20 LAB — CBC WITH DIFFERENTIAL/PLATELET
Basophils Absolute: 0 10*3/uL (ref 0.0–0.1)
Eosinophils Absolute: 0.1 10*3/uL (ref 0.0–0.7)
Hemoglobin: 16.1 g/dL — ABNORMAL HIGH (ref 12.0–15.0)
Lymphs Abs: 2.9 10*3/uL (ref 0.7–4.0)
Monocytes Relative: 11 % (ref 3–12)
Neutro Abs: 7.8 10*3/uL — ABNORMAL HIGH (ref 1.7–7.7)
Neutrophils Relative %: 64 % (ref 43–77)
Platelets: 249 10*3/uL (ref 150–400)
RBC: 5.26 MIL/uL — ABNORMAL HIGH (ref 3.87–5.11)
WBC: 12.2 10*3/uL — ABNORMAL HIGH (ref 4.0–10.5)

## 2013-06-20 LAB — COMPREHENSIVE METABOLIC PANEL
ALT: 18 U/L (ref 0–35)
Albumin: 4.1 g/dL (ref 3.5–5.2)
Alkaline Phosphatase: 82 U/L (ref 39–117)
BUN: 13 mg/dL (ref 6–23)
Chloride: 84 mEq/L — ABNORMAL LOW (ref 96–112)
Creatinine, Ser: 0.87 mg/dL (ref 0.50–1.10)
GFR calc Af Amer: 77 mL/min — ABNORMAL LOW (ref >90)
Glucose, Bld: 178 mg/dL — ABNORMAL HIGH (ref 70–99)
Potassium: 4 mEq/L (ref 3.5–5.1)
Sodium: 121 mEq/L — ABNORMAL LOW (ref 135–145)
Total Bilirubin: 0.2 mg/dL — ABNORMAL LOW (ref 0.3–1.2)
Total Protein: 7.3 g/dL (ref 6.0–8.3)

## 2013-06-20 LAB — PRO B NATRIURETIC PEPTIDE: Pro B Natriuretic peptide (BNP): 77.4 pg/mL (ref 0–125)

## 2013-06-20 LAB — TROPONIN I: Troponin I: <0.3 ng/mL (ref <0.30)

## 2013-06-20 LAB — D-DIMER, QUANTITATIVE (NOT AT ARMC): D-Dimer, Quant: <0.27 ug/mL-FEU (ref 0.00–0.48)

## 2013-06-20 MED ORDER — FUROSEMIDE 40 MG PO TABS
40.0000 mg | ORAL_TABLET | Freq: Once | ORAL | Status: AC
  Administered 2013-06-20: 40 mg via ORAL
  Filled 2013-06-20: qty 1

## 2013-06-20 MED ORDER — ALBUTEROL SULFATE (5 MG/ML) 0.5% IN NEBU
5.0000 mg | INHALATION_SOLUTION | Freq: Once | RESPIRATORY_TRACT | Status: AC
  Administered 2013-06-20: 5 mg via RESPIRATORY_TRACT
  Filled 2013-06-20: qty 1

## 2013-06-20 MED ORDER — PREDNISONE 50 MG PO TABS
60.0000 mg | ORAL_TABLET | Freq: Once | ORAL | Status: AC
  Administered 2013-06-20: 60 mg via ORAL
  Filled 2013-06-20 (×2): qty 1

## 2013-06-20 MED ORDER — ALBUTEROL SULFATE HFA 108 (90 BASE) MCG/ACT IN AERS
2.0000 | INHALATION_SPRAY | Freq: Once | RESPIRATORY_TRACT | Status: AC
  Administered 2013-06-20: 2 via RESPIRATORY_TRACT
  Filled 2013-06-20: qty 6.7

## 2013-06-20 MED ORDER — PREDNISONE 20 MG PO TABS: ORAL_TABLET | ORAL | Status: DC

## 2013-06-20 MED ORDER — IPRATROPIUM BROMIDE 0.02 % IN SOLN
0.5000 mg | Freq: Once | RESPIRATORY_TRACT | Status: AC
  Administered 2013-06-20: 0.5 mg via RESPIRATORY_TRACT
  Filled 2013-06-20: qty 2.5

## 2013-06-20 NOTE — ED Notes (Signed)
Pt with recent dx of bronchitis , c/o mid chest pressure and SOB

## 2013-06-20 NOTE — ED Notes (Signed)
Pt c/o sob and pressure in center of chest today. States she has recently been treated from bronchitis.

## 2013-06-20 NOTE — ED Provider Notes (Signed)
CSN: [id number]     Arrival date & time 06/20/13  1412 History  This chart was scribed for Donnetta Hutching, MD by Leone Payor, ED Scribe. This patient was seen in room [facility] and the patient's care was started 2:35 PM.    Chief Complaint  Patient presents with  . Shortness of Breath  . Chest Pain    The history is provided by the patient. No language interpreter was used.    HPI Comments: Erika Lee is a 67 y.o. female who presents to the Emergency Department complaining of constant, gradually worsening SOB, cough and chest pain that began today. She was seen on 06/10/13 for a rapid heart beat and palpitations and was diagnosed with A-fib. She was later seen by her PCP and was prescribed a Zpack but was switched to Bactrim 3 days ago. She has been compliant with the prescribed Bactrim and prednisone. She denies history of lung disease. No fever, chills, rusty sputum. Lungs feel irritated. Severity is mild to moderate. Nothing makes symptoms better or worse.  Past Medical History  Diagnosis Date  . Hyperlipidemia   . Multinodular goiter   . Atrial fibrillation     Documented 2008, spontaneously converted  . GERD (gastroesophageal reflux disease)   . Essential hypertension, benign   . Palpitations   . Diabetes mellitus     Borderline  . Arthritis    Past Surgical History  Procedure Laterality Date  . Biopsy thyroid    . Tonsillectomy    . Colonoscopy  09/10/2011    Procedure: COLONOSCOPY;  Surgeon: Dalia Heading, MD;  Location: [facility];  Service: Gastroenterology;  Laterality: N/A;  . Lesion removal N/A 05/03/2013    Procedure: EXCISION CYST, BACK;  Surgeon: Dalia Heading, MD;  Location: [facility];  Service: General;  Laterality: N/A;   Family History  Problem Relation Age of Onset  . Stroke Mother    History  Substance Use Topics  . Smoking status: Former Smoker    Types: Cigarettes    Quit date: 11/29/1993  . Smokeless tobacco: Never Used  . Alcohol Use: No   OB  History   Grav Para Term Preterm Abortions TAB SAB Ect Mult Living                 Review of Systems A complete 10 system review of systems was obtained and all systems are negative except as noted in the HPI and PMH.   Allergies  Doxycycline; Sulfonamide derivatives; Adhesive; and Clindamycin/lincomycin  Home Medications   Current Outpatient Rx  Name  Route  Sig  Dispense  Refill  . apixaban (ELIQUIS) 5 MG TABS tablet   Oral   Take 1 tablet (5 mg total) by mouth 2 (two) times daily.   60 tablet   12   . aspirin 81 MG EC tablet   Oral   Take 81 mg by mouth daily.           Marland Kitchen esomeprazole (NEXIUM) 40 MG capsule   Oral   Take 40 mg by mouth daily before breakfast.           . flecainide (TAMBOCOR) 50 MG tablet   Oral   Take 1 tablet (50 mg total) by mouth every 12 (twelve) hours.   60 tablet   12   . hydrochlorothiazide 25 MG tablet   Oral   Take 25 mg by mouth daily.           Marland Kitchen HYDROcodone-acetaminophen (  LORTAB) 7.5-500 MG per tablet   Oral   Take 1 tablet by mouth every 6 (six) hours as needed. For pain   30 tablet   0   . lisinopril (PRINIVIL,ZESTRIL) 10 MG tablet   Oral   Take 10 mg by mouth daily.           Marland Kitchen loratadine (CLARITIN) 10 MG tablet   Oral   Take 10 mg by mouth daily as needed for allergies.         . metFORMIN (GLUCOPHAGE) 500 MG tablet   Oral   Take 500 mg by mouth daily.         . metoprolol succinate (TOPROL XL) 25 MG 24 hr tablet   Oral   Take 0.5 tablets (12.5 mg total) by mouth daily.   60 tablet   3   . Omega-3 Fatty Acids (FISH OIL) 1000 MG CAPS   Oral   Take 1,000 mg by mouth daily.          . Potassium Gluconate 550 MG TABS   Oral   Take 1 tablet (550 mg total) by mouth 2 (two) times daily.   30 each   0   . simvastatin (ZOCOR) 40 MG tablet   Oral   Take 40 mg by mouth at bedtime.           . thiamine (VITAMIN B-1) 100 MG tablet   Oral   Take 100 mg by mouth daily.          BP 146/80  Pulse  88  Temp(Src) 98.5 F (36.9 C)  Resp 27  Ht 5\' 4"  (1.626 m)  Wt 172 lb (78.019 kg)  BMI 29.51 kg/m2  SpO2 99% Physical Exam  Nursing note and vitals reviewed. Constitutional: She is oriented to person, place, and time. She appears well-developed and well-nourished.  HENT:  Head: Normocephalic and atraumatic.  Eyes: Conjunctivae and EOM are normal. Pupils are equal, round, and reactive to light.  Neck: Normal range of motion. Neck supple.  Cardiovascular: Normal rate, regular rhythm and normal heart sounds.   Pulmonary/Chest: Effort normal and breath sounds normal.  Abdominal: Soft. Bowel sounds are normal.  Musculoskeletal: Normal range of motion.  Neurological: She is alert and oriented to person, place, and time.  Skin: Skin is warm and dry.  Psychiatric: She has a normal mood and affect. Her behavior is normal.    ED Course  Procedures   DIAGNOSTIC STUDIES: Oxygen Saturation is 99% on RA, normal by my interpretation.    COORDINATION OF CARE: 2:52 PM Discussed treatment plan with pt at bedside and pt agreed to plan.   Labs Review Labs Reviewed  CBC WITH DIFFERENTIAL - Abnormal; Notable for the following:    WBC 12.2 (*)    RBC 5.26 (*)    Hemoglobin 16.1 (*)    MCHC 37.0 (*)    Neutro Abs 7.8 (*)    Monocytes Absolute 1.4 (*)    All other components within normal limits  COMPREHENSIVE METABOLIC PANEL - Abnormal; Notable for the following:    Sodium 121 (*)    Chloride 84 (*)    Glucose, Bld 178 (*)    Total Bilirubin 0.2 (*)    GFR calc non Af Amer 66 (*)    GFR calc Af Amer 77 (*)    All other components within normal limits  TROPONIN I  PRO B NATRIURETIC PEPTIDE  D-DIMER, QUANTITATIVE   Imaging Review Dg Chest Portable 1 View  06/20/2013   CLINICAL DATA:  Chest pain, shortness of breath  EXAM: PORTABLE CHEST - 1 VIEW  COMPARISON:  06/17/2013  FINDINGS: Cardiomegaly with suspected mild interstitial edema, new.  No pleural effusion or pneumothorax.   IMPRESSION: Cardiomegaly with suspected mild interstitial edema, new.   Electronically Signed   By: Charline Bills M.D.   On: 06/20/2013 15:10    EKG Interpretation    Date/Time:  Sunday June 20 2013 14:27:01 EST Ventricular Rate:  49 PR Interval:  184 QRS Duration: 110 QT Interval:  448 QTC Calculation: 404 R Axis:   68 Text Interpretation:  Sinus bradycardia Otherwise normal ECG When compared with ECG of 12-Jun-2013 05:12, Abberant conduction is no longer Present Confirmed by   MD,  ([id number]) on 06/20/2013 4:01:29 PM            MDM  No diagnosis found. History and physical consistent with URI. No rales on physical exam. BNP, troponin, d-dimer all normal.  Patient feels better after Albuterol/ Atrovent. Discharge home with prednisone and albuterol inhaler. Patient is presently on Bactrim    I personally performed the services described in this documentation, which was scribed in my presence. The recorded information has been reviewed and is accurate.   Donnetta Hutching, MD 06/20/13 1900

## 2013-06-23 ENCOUNTER — Ambulatory Visit (INDEPENDENT_AMBULATORY_CARE_PROVIDER_SITE_OTHER): Payer: Medicare Other | Admitting: Cardiovascular Disease

## 2013-06-23 ENCOUNTER — Encounter: Payer: Self-pay | Admitting: Cardiovascular Disease

## 2013-06-23 VITALS — BP 134/65 | HR 66 | Ht 64.0 in | Wt 171.0 lb

## 2013-06-23 DIAGNOSIS — E119 Type 2 diabetes mellitus without complications: Secondary | ICD-10-CM

## 2013-06-23 DIAGNOSIS — R5383 Other fatigue: Secondary | ICD-10-CM

## 2013-06-23 DIAGNOSIS — R5381 Other malaise: Secondary | ICD-10-CM

## 2013-06-23 DIAGNOSIS — R42 Dizziness and giddiness: Secondary | ICD-10-CM

## 2013-06-23 DIAGNOSIS — R0609 Other forms of dyspnea: Secondary | ICD-10-CM

## 2013-06-23 DIAGNOSIS — T50905A Adverse effect of unspecified drugs, medicaments and biological substances, initial encounter: Secondary | ICD-10-CM

## 2013-06-23 DIAGNOSIS — T887XXA Unspecified adverse effect of drug or medicament, initial encounter: Secondary | ICD-10-CM

## 2013-06-23 DIAGNOSIS — I4891 Unspecified atrial fibrillation: Secondary | ICD-10-CM

## 2013-06-23 DIAGNOSIS — R0600 Dyspnea, unspecified: Secondary | ICD-10-CM

## 2013-06-23 DIAGNOSIS — I10 Essential (primary) hypertension: Secondary | ICD-10-CM

## 2013-06-23 NOTE — Progress Notes (Signed)
Patient ID: Erika Lee, female   DOB: 1945/09/08, 67 y.o.   MRN: [id number]      SUBJECTIVE: The patient is a 67 year old woman with a history of hypertension and diabetes mellitus who was recently hospitalized with presyncope and atrial fibrillation with a rapid ventricular response. She reportedly converted to normal sinus rhythm with a single dose of IV diltiazem which provoked a bradycardic response with a HR in the 40 bpm range. She underwent a stress echocardiogram which was negative for inducible ischemia. She was started on metoprolol, flecainide, and apixaban. She has a prior history of smoking. She was again recently evaluated in the emergency department for chest pain, cough, and congestion with wheezing. She was diagnosed with an upper respiratory tract infection. She was discharged on Bactrim and prednisone. She did express relief in the emergency department with an Atrovent inhaler. She denies a h/o COPD. She quit smoking in 1995.  However, she emphasizes that she has never experienced symptoms like this before, and they began shortly after starting these new cardiac medications. She feels weak, short of breath, and dizzy. Her heart rate 66 beats per minute her blood pressure is 134/65 mmHg. She had to cancel plans to go to [place] to see her grandchild for Christmas.    Allergies  Allergen Reactions  . Doxycycline Other (See Comments)    Makes heart race  . Sulfonamide Derivatives Nausea And Vomiting  . Adhesive [Tape] Rash  . Clindamycin/Lincomycin Rash    Current Outpatient Prescriptions  Medication Sig Dispense Refill  . apixaban (ELIQUIS) 5 MG TABS tablet Take 1 tablet (5 mg total) by mouth 2 (two) times daily.  60 tablet  12  . esomeprazole (NEXIUM) 40 MG capsule Take 40 mg by mouth daily before breakfast.        . flecainide (TAMBOCOR) 50 MG tablet Take 1 tablet (50 mg total) by mouth every 12 (twelve) hours.  60 tablet  12  . hydrochlorothiazide 25 MG tablet Take 25  mg by mouth daily.        Marland Kitchen lisinopril (PRINIVIL,ZESTRIL) 10 MG tablet Take 10 mg by mouth daily.        Marland Kitchen loratadine (CLARITIN) 10 MG tablet Take 10 mg by mouth daily as needed for allergies.      . metFORMIN (GLUCOPHAGE) 500 MG tablet Take 500 mg by mouth daily.      . metoprolol succinate (TOPROL XL) 25 MG 24 hr tablet Take 0.5 tablets (12.5 mg total) by mouth daily.  60 tablet  3  . Omega-3 Fatty Acids (FISH OIL) 1000 MG CAPS Take 1,000 mg by mouth daily.       . Potassium Gluconate 550 MG TABS Take 1 tablet (550 mg total) by mouth 2 (two) times daily.  30 each  0  . simvastatin (ZOCOR) 40 MG tablet Take 40 mg by mouth at bedtime.        . sulfamethoxazole-trimethoprim (BACTRIM DS) 800-160 MG per tablet Take 1 tablet by mouth 2 (two) times daily. Started 06/17/13      . thiamine (VITAMIN B-1) 100 MG tablet Take 100 mg by mouth daily.       No current facility-administered medications for this visit.    Past Medical History  Diagnosis Date  . Hyperlipidemia   . Multinodular goiter   . Atrial fibrillation     Documented 2008, spontaneously converted  . GERD (gastroesophageal reflux disease)   . Essential hypertension, benign   . Palpitations   . Diabetes  mellitus     Borderline  . Arthritis     Past Surgical History  Procedure Laterality Date  . Biopsy thyroid    . Tonsillectomy    . Colonoscopy  09/10/2011    Procedure: COLONOSCOPY;  Surgeon: Dalia Heading, MD;  Location: [facility];  Service: Gastroenterology;  Laterality: N/A;  . Lesion removal N/A 05/03/2013    Procedure: EXCISION CYST, BACK;  Surgeon: Dalia Heading, MD;  Location: [facility];  Service: General;  Laterality: N/A;    History   Social History  . Marital Status: Married    Spouse Name: N/A    Number of Children: N/A  . Years of Education: N/A   Occupational History  . Trucking Business     Full time   Social History Main Topics  . Smoking status: Former Smoker    Types: Cigarettes    Quit date:  11/29/1993  . Smokeless tobacco: Never Used  . Alcohol Use: No  . Drug Use: No  . Sexual Activity: Not on file   Other Topics Concern  . Not on file   Social History Narrative  . No narrative on file     Filed Vitals:   06/23/13 0917  BP: 134/65  Pulse: 66  Height: 5\' 4"  (1.626 m)  Weight: 171 lb (77.565 kg)    PHYSICAL EXAM General: NAD Neck: No JVD, no thyromegaly or thyroid nodule.  Lungs: Faint expiratory wheezes bilaterally with diminished respiratory effort. CV: Nondisplaced PMI.  Heart regular S1/S2, no S3/S4, no murmur.  No peripheral edema.  No carotid bruit.  Normal pedal pulses.  Abdomen: Soft, nontender, no hepatosplenomegaly, no distention.  Neurologic: Alert and oriented x 3.  Psych: Normal affect. Extremities: No clubbing or cyanosis.   ECG: reviewed and available in electronic records.      ASSESSMENT AND PLAN: 1. Dyspnea: I suspect that this may be related to flecainide, given that approximately 10% of patients will experience dyspnea shortly after starting this medication. I will discontinue this altogether. 2. Paroxysmal atrial fibrillation: I will continue apixaban and 12.5 mg Toprol XL daily. 3. HTN: controlled on present therapy.  Dispo: I will have her f/u with Dr. Diona Browner in 2 weeks.   Prentice Docker, M.D., F.A.C.C.

## 2013-06-23 NOTE — Patient Instructions (Signed)
Your physician recommends that you schedule a follow-up appointment in: 1-2 weeks with Dr. Diona Browner  Your physician has recommended you make the following change in your medication 1. STOP FLECAINIDE

## 2013-06-29 ENCOUNTER — Encounter: Payer: Medicare Other | Admitting: Adult Health

## 2013-06-29 ENCOUNTER — Encounter: Payer: Self-pay | Admitting: Cardiology

## 2013-06-29 ENCOUNTER — Ambulatory Visit (INDEPENDENT_AMBULATORY_CARE_PROVIDER_SITE_OTHER): Payer: Medicare Other | Admitting: Cardiology

## 2013-06-29 VITALS — BP 130/79 | HR 59 | Ht 64.0 in | Wt 170.0 lb

## 2013-06-29 DIAGNOSIS — I4891 Unspecified atrial fibrillation: Secondary | ICD-10-CM

## 2013-06-29 DIAGNOSIS — I10 Essential (primary) hypertension: Secondary | ICD-10-CM

## 2013-06-29 DIAGNOSIS — R001 Bradycardia, unspecified: Secondary | ICD-10-CM

## 2013-06-29 DIAGNOSIS — I495 Sick sinus syndrome: Secondary | ICD-10-CM

## 2013-06-29 DIAGNOSIS — I480 Paroxysmal atrial fibrillation: Secondary | ICD-10-CM

## 2013-06-29 DIAGNOSIS — I498 Other specified cardiac arrhythmias: Secondary | ICD-10-CM

## 2013-06-29 NOTE — Patient Instructions (Signed)
Your physician recommends that you schedule a follow-up appointment in:  January 7th at the [facility] office . Please arrive at 8:15am  Your physician recommends that you continue on your current medications as directed. Please refer to the Current Medication list given to you today.

## 2013-06-29 NOTE — Assessment & Plan Note (Signed)
Not entirely certain that this is symptomatic, however it is a possibility. Reluctant to discontinue Toprol-XL altogether given her episodes of very rapid atrial fibrillation that have already been documented. Please refer to the cardiac monitor strips in EPIC.

## 2013-06-29 NOTE — Assessment & Plan Note (Signed)
Blood pressure is normal today, not orthostatic.

## 2013-06-29 NOTE — Progress Notes (Signed)
  Clinical Summary [name] is a 67 y.o.female recently evaluated with symptomatic paroxysmal atrial fibrillation. Most recent management has included Eliquis, Toprol-XL, and flecainide which was initiated in November. She underwent an exercise echocardiogram that demonstrated no definite echocardiographic evidence of ischemia.  I see that she was evaluated in the office on December 24 by Dr. Koneswaran due to complaints of intermittent chest pain and cough associated with congestion and wheezing. She had been seen in the ER and diagnosed with possible URI. It was felt that there might be some possible correlation to being on flecainide, and this medication was discontinued.  She comes in to the office today without an appointment, demanding to be seen. She is here with her husband. She tells me that she feels weak and tired, still has brief palpitations but these did not correlate with her fatigue necessarily. She has not noted any major change since being off flecainide.  She does remain on Eliquis and Toprol-XL at very low dose. ECG shows sinus bradycardia at 57. She is not orthostatic on examination with no significant drop in blood pressure and stable heart rate.  She voices frustration with her current symptoms and states that she wants to feel better, would like to be evaluated for possible atrial fibrillation ablation. She mentioned to me the name of Dr. James Allred, had heard of him through family contacts.  I explained that I would be happy to make a referral to see him for EP consultation, although asked her to remain open to the possibility that different medications may be tried first prior to considering an ablation attempt. I also made it clear that it was not certain that all of her current symptomatology was related to either paroxysmal atrial fibrillation or her present medications based on objective findings. She could potentially be very sensitive to beta blocker with her  bradycardia however.   Allergies  Allergen Reactions  . Doxycycline Other (See Comments)    Makes heart race  . Sulfonamide Derivatives Nausea And Vomiting  . Adhesive [Tape] Rash  . Clindamycin/Lincomycin Rash    Current Outpatient Prescriptions  Medication Sig Dispense Refill  . apixaban (ELIQUIS) 5 MG TABS tablet Take 1 tablet (5 mg total) by mouth 2 (two) times daily.  60 tablet  12  . esomeprazole (NEXIUM) 40 MG capsule Take 40 mg by mouth daily before breakfast.        . hydrochlorothiazide 25 MG tablet Take 25 mg by mouth daily.        . lisinopril (PRINIVIL,ZESTRIL) 10 MG tablet Take 10 mg by mouth daily.        . loratadine (CLARITIN) 10 MG tablet Take 10 mg by mouth daily as needed for allergies.      . metFORMIN (GLUCOPHAGE) 500 MG tablet Take 500 mg by mouth daily.      . metoprolol succinate (TOPROL XL) 25 MG 24 hr tablet Take 0.5 tablets (12.5 mg total) by mouth daily.  60 tablet  3  . Omega-3 Fatty Acids (FISH OIL) 1000 MG CAPS Take 1,000 mg by mouth daily.       . Potassium Gluconate 550 MG TABS Take 1 tablet (550 mg total) by mouth 2 (two) times daily.  30 each  0  . simvastatin (ZOCOR) 40 MG tablet Take 40 mg by mouth at bedtime.        . sulfamethoxazole-trimethoprim (BACTRIM DS) 800-160 MG per tablet Take 1 tablet by mouth 2 (two) times daily. Started 06/17/13      .   thiamine (VITAMIN B-1) 100 MG tablet Take 100 mg by mouth daily.       No current facility-administered medications for this visit.    Past Medical History  Diagnosis Date  . Hyperlipidemia   . Multinodular goiter   . Atrial fibrillation     Documented 2008, spontaneously converted  . GERD (gastroesophageal reflux disease)   . Essential hypertension, benign   . Palpitations   . Diabetes mellitus     Borderline  . Arthritis     Past Surgical History  Procedure Laterality Date  . Biopsy thyroid    . Tonsillectomy    . Colonoscopy  09/10/2011    Procedure: COLONOSCOPY;  Surgeon: Mark A  Jenkins, MD;  Location: [facility];  Service: Gastroenterology;  Laterality: N/A;  . Lesion removal N/A 05/03/2013    Procedure: EXCISION CYST, BACK;  Surgeon: Mark A Jenkins, MD;  Location: [facility];  Service: General;  Laterality: N/A;    Family History  Problem Relation Age of Onset  . Stroke Mother     Social History Ms. Cargile reports that she quit smoking about 19 years ago. Her smoking use included Cigarettes. She smoked 0.00 packs per day. She has never used smokeless tobacco. Ms. Jalbert reports that she does not drink alcohol.  Review of Systems No further cough, no fevers or chills. No chest pain. No syncope. Otherwise as outlined.  Physical Examination Filed Vitals:   06/29/13 1451  BP: 130/79  Pulse: 59   Filed Weights   06/29/13 1451  Weight: 170 lb (77.111 kg)    Overweight woman in no acute distress.  HEENT: Conjunctiva and lids normal, oropharynx with moist mucosa.  Neck: Supple, no elevated JVP or carotid bruits.  Lungs: Clear to auscultation, nonlabored.  Cardiac: Regular rate and rhythm, soft systolic murmur at the base, no pericardial rub.  Abdomen: Soft, nontender, bowel sounds present.  Skin: Warm and dry.  Musculoskeletal: No kyphosis.  Extremities: No pitting edema.  Neuropsychiatric: Alert and oriented x3, affect appropriate.    Problem List and Plan   Paroxysmal atrial fibrillation For now we will continue Eliquis and low-dose Toprol-XL. Dr. Koneswaran stopped the patient's flecainide last week, she has not noted any improvement in her fatigue and sense of shortness of breath, not entirely clear that this was related. Not certain that I would consider this a medication failure either. Having said that, she has requested referral for EP consultation, and I will have her see Dr. Allred next week at his earliest available appointment. She may need to be considered for a different antiarrhythmic treatment prior to considering ablation attempt,  although will defer to Dr. Allred's judgment and management. She voiced satisfaction with this plan, and will make no other medication adjustments at this time.  Sinus bradycardia Not entirely certain that this is symptomatic, however it is a possibility. Reluctant to discontinue Toprol-XL altogether given her episodes of very rapid atrial fibrillation that have already been documented. Please refer to the cardiac monitor strips in EPIC.  Tachycardia-bradycardia Remains a concern as far as treatment options for PAF.  Essential hypertension, benign Blood pressure is normal today, not orthostatic.     G. , M.D., F.A.C.C.   

## 2013-06-29 NOTE — Assessment & Plan Note (Signed)
For now we will continue Eliquis and low-dose Toprol-XL. Dr. Purvis Sheffield stopped the patient's flecainide last week, she has not noted any improvement in her fatigue and sense of shortness of breath, not entirely clear that this was related. Not certain that I would consider this a medication failure either. Having said that, she has requested referral for EP consultation, and I will have her see Dr. Johney Frame next week at his earliest available appointment. She may need to be considered for a different antiarrhythmic treatment prior to considering ablation attempt, although will defer to Dr. Jenel Lucks judgment and management. She voiced satisfaction with this plan, and will make no other medication adjustments at this time.

## 2013-06-29 NOTE — Assessment & Plan Note (Signed)
Remains a concern as far as treatment options for PAF.

## 2013-07-07 ENCOUNTER — Encounter: Payer: Self-pay | Admitting: Internal Medicine

## 2013-07-07 ENCOUNTER — Encounter: Payer: Self-pay | Admitting: *Deleted

## 2013-07-07 ENCOUNTER — Ambulatory Visit (INDEPENDENT_AMBULATORY_CARE_PROVIDER_SITE_OTHER): Payer: Medicare Other | Admitting: Internal Medicine

## 2013-07-07 VITALS — BP 140/83 | HR 62 | Ht 64.0 in | Wt 174.1 lb

## 2013-07-07 DIAGNOSIS — I4891 Unspecified atrial fibrillation: Secondary | ICD-10-CM

## 2013-07-07 DIAGNOSIS — I480 Paroxysmal atrial fibrillation: Secondary | ICD-10-CM

## 2013-07-07 DIAGNOSIS — I4892 Unspecified atrial flutter: Secondary | ICD-10-CM | POA: Insufficient documentation

## 2013-07-07 DIAGNOSIS — I10 Essential (primary) hypertension: Secondary | ICD-10-CM

## 2013-07-07 LAB — CBC WITH DIFFERENTIAL/PLATELET
Basophils Absolute: 0 10*3/uL (ref 0.0–0.1)
Basophils Relative: 0.3 % (ref 0.0–3.0)
EOS PCT: 1.9 % (ref 0.0–5.0)
Eosinophils Absolute: 0.1 10*3/uL (ref 0.0–0.7)
HCT: 39.9 % (ref 36.0–46.0)
HEMOGLOBIN: 13.7 g/dL (ref 12.0–15.0)
LYMPHS PCT: 34.8 % (ref 12.0–46.0)
Lymphs Abs: 2.7 10*3/uL (ref 0.7–4.0)
MCHC: 34.3 g/dL (ref 30.0–36.0)
MCV: 87.3 fl (ref 78.0–100.0)
MONOS PCT: 7.5 % (ref 3.0–12.0)
Monocytes Absolute: 0.6 10*3/uL (ref 0.1–1.0)
NEUTROS ABS: 4.2 10*3/uL (ref 1.4–7.7)
Neutrophils Relative %: 55.5 % (ref 43.0–77.0)
Platelets: 201 10*3/uL (ref 150.0–400.0)
RBC: 4.57 Mil/uL (ref 3.87–5.11)
RDW: 12.6 % (ref 11.5–14.6)
WBC: 7.6 10*3/uL (ref 4.5–10.5)

## 2013-07-07 LAB — BASIC METABOLIC PANEL
BUN: 10 mg/dL (ref 6–23)
CO2: 29 meq/L (ref 19–32)
Calcium: 9.7 mg/dL (ref 8.4–10.5)
Chloride: 97 mEq/L (ref 96–112)
Creatinine, Ser: 0.7 mg/dL (ref 0.4–1.2)
GFR: 85.2 mL/min (ref >60.00)
Glucose, Bld: 149 mg/dL — ABNORMAL HIGH (ref 70–99)
Potassium: 4.1 mEq/L (ref 3.5–5.1)
Sodium: 133 mEq/L — ABNORMAL LOW (ref 135–145)

## 2013-07-07 NOTE — Progress Notes (Signed)
Primary Care Physician: Alonza Bogus, MD Referring Physician: Johnny Bridge, MD   Erika Lee is a 68 y.o. female with a h/o hypertension, hyperlipidemia, and diabetes.  There is a remote history of atrial fibrillation but the patient does not remember ever being diagnosed.  Several weeks ago she developed tachypalpitations and shortness of breath.  She went to [facility] for evaluation and was found to be in initially atrial flutter with RVR and subsequently degenerated to afib with RVR.  She developed pre-syncope with her tachycardia and was given IV Cardizem which converted her to SR.  She was seen by cardiology that admission and was placed on Flecainide and Metoprolol.  She felt that the medications were causing weakness and fatigue and followed up with Dr Bronson Ing who discontinued her Flecainide.  She then followed up with Dr Domenic Polite after having persistent spells of atrial fibrillation and was referred today for further evaluation.  She is appropriately anticoagulated with Eliquis.  She has had a stress echo but not a 2D echo.   Today, she denies symptoms of palpitations, chest pain, shortness of breath, orthopnea, PND, lower extremity edema, dizziness, presyncope, syncope, or neurologic sequela. The patient is tolerating medications without difficulties and is otherwise without complaint today.   Past Medical History  Diagnosis Date  . Hyperlipidemia   . Multinodular goiter   . Paroxysmal atrial fibrillation   . GERD (gastroesophageal reflux disease)   . Essential hypertension, benign   . Palpitations   . Diabetes mellitus     Borderline  . Arthritis   . Atrial flutter    Past Surgical History  Procedure Laterality Date  . Biopsy thyroid    . Tonsillectomy    . Colonoscopy  09/10/2011    Procedure: COLONOSCOPY;  Surgeon: Jamesetta So, MD;  Location: [facility];  Service: Gastroenterology;  Laterality: N/A;  . Lesion removal N/A 05/03/2013    Procedure: EXCISION CYST,  BACK;  Surgeon: Jamesetta So, MD;  Location: [facility];  Service: General;  Laterality: N/A;    Current Outpatient Prescriptions  Medication Sig Dispense Refill  . apixaban (ELIQUIS) 5 MG TABS tablet Take 1 tablet (5 mg total) by mouth 2 (two) times daily.  60 tablet  12  . esomeprazole (NEXIUM) 40 MG capsule Take 40 mg by mouth daily before breakfast.        . hydrochlorothiazide 25 MG tablet Take 25 mg by mouth daily.        Marland Kitchen lisinopril (PRINIVIL,ZESTRIL) 10 MG tablet Take 10 mg by mouth daily.        Marland Kitchen loratadine (CLARITIN) 10 MG tablet Take 10 mg by mouth daily as needed for allergies.      . metFORMIN (GLUCOPHAGE) 500 MG tablet Take 500 mg by mouth daily.      . metoprolol succinate (TOPROL XL) 25 MG 24 hr tablet Take 0.5 tablets (12.5 mg total) by mouth daily.  60 tablet  3  . Omega-3 Fatty Acids (FISH OIL) 1000 MG CAPS Take 1,000 mg by mouth daily.       . Potassium Gluconate 550 MG TABS Take 1 tablet (550 mg total) by mouth 2 (two) times daily.  30 each  0  . simvastatin (ZOCOR) 40 MG tablet Take 40 mg by mouth at bedtime.        . thiamine (VITAMIN B-1) 100 MG tablet Take 100 mg by mouth daily.       No current facility-administered medications for this visit.  Allergies  Allergen Reactions  . Clindamycin/Lincomycin Rash  . Doxycycline Other (See Comments)    Makes heart race  . Adhesive [Tape] Rash  . Sulfonamide Derivatives Nausea And Vomiting    History   Social History  . Marital Status: Married    Spouse Name: N/A    Number of Children: N/A  . Years of Education: N/A   Occupational History  . Trucking Business     Full time   Social History Main Topics  . Smoking status: Former Smoker    Types: Cigarettes    Quit date: 11/29/1993  . Smokeless tobacco: Never Used  . Alcohol Use: No  . Drug Use: No  . Sexual Activity: Not on file   Other Topics Concern  . Not on file   Social History Narrative   Lives with spouse in [place]    Family History    Problem Relation Age of Onset  . Stroke Mother     ROS- All systems are reviewed and negative except as per the HPI above  Physical Exam: Filed Vitals:   07/07/13 0809  BP: 140/83  Pulse: 62  Height: 5' 4" (1.626 m)  Weight: 174 lb 1.9 oz (78.98 kg)    GEN- The patient is well appearing, alert and oriented x 3 today.   Head- normocephalic, atraumatic Eyes-  Sclera clear, conjunctiva pink Ears- hearing intact Oropharynx- clear Neck- supple, no JVP Lymph- no cervical lymphadenopathy Lungs- Clear to ausculation bilaterally, normal work of breathing Heart- Regular rate and rhythm, no murmurs, rubs or gallops, PMI not laterally displaced GI- soft, NT, ND, + BS Extremities- no clubbing, cyanosis, or edema MS- no significant deformity or atrophy Skin- no rash or lesion Psych- euthymic mood, full affect Neuro- strength and sensation are intact  EKG- sinus rhythm rate 62, normal intervals Event monitor is reviewed Stress test is reviewed  Assessment and Plan:  1. Atrial fibrillation and atrial flutter The patient has symptomatic typical appearing atrial flutter and afib.  She has failed medical therapy with flecainide and metoprolol.  Therapeutic strategies for afib and atrial flutter including medicine and ablation were discussed in detail with the patient today. Risk, benefits, and alternatives to EP study and radiofrequency ablation for afib were also discussed in detail today. These risks include but are not limited to stroke, bleeding, vascular damage, tamponade, perforation, damage to the esophagus, lungs, and other structures, pulmonary vein stenosis, worsening renal function, and death. The patient understands these risk and wishes to proceed.  We will therefore proceed with catheter ablation at the next available time.  We will obtain a 2 D echo to evaluate LA size and exclude valvular heart disease.  Chads2vasc score is at least 4.  She should therefore continue long term  anticoagulation.  2. HTN Stable No change required today   

## 2013-07-07 NOTE — Patient Instructions (Addendum)
Your physician has recommended that you have an ablation. Catheter ablation is a medical procedure used to treat some cardiac arrhythmias (irregular heartbeats). During catheter ablation, a long, thin, flexible tube is put into a blood vessel in your groin (upper thigh), or neck. This tube is called an ablation catheter. It is then guided to your heart through the blood vessel. Radio frequency waves destroy small areas of heart tissue where abnormal heartbeats may cause an arrhythmia to start. Please see the instruction sheet given to you today.     Your physician has requested that you have an echocardiogram. Echocardiography is a painless test that uses sound waves to create images of your heart. It provides your doctor with information about the size and shape of your heart and how well your heart's chambers and valves are working. This procedure takes approximately one hour. There are no restrictions for this procedure. Before 07/16/13

## 2013-07-12 ENCOUNTER — Ambulatory Visit: Payer: Medicare Other | Admitting: Cardiology

## 2013-07-12 ENCOUNTER — Encounter (HOSPITAL_COMMUNITY): Payer: Self-pay | Admitting: Respiratory Therapy

## 2013-07-13 ENCOUNTER — Encounter: Payer: Self-pay | Admitting: Cardiovascular Disease

## 2013-07-13 ENCOUNTER — Ambulatory Visit (HOSPITAL_COMMUNITY): Payer: Medicare Other | Attending: Internal Medicine | Admitting: Radiology

## 2013-07-13 DIAGNOSIS — E119 Type 2 diabetes mellitus without complications: Secondary | ICD-10-CM | POA: Insufficient documentation

## 2013-07-13 DIAGNOSIS — E785 Hyperlipidemia, unspecified: Secondary | ICD-10-CM | POA: Insufficient documentation

## 2013-07-13 DIAGNOSIS — I10 Essential (primary) hypertension: Secondary | ICD-10-CM | POA: Insufficient documentation

## 2013-07-13 DIAGNOSIS — E669 Obesity, unspecified: Secondary | ICD-10-CM | POA: Insufficient documentation

## 2013-07-13 DIAGNOSIS — Z87891 Personal history of nicotine dependence: Secondary | ICD-10-CM | POA: Insufficient documentation

## 2013-07-13 DIAGNOSIS — I4891 Unspecified atrial fibrillation: Secondary | ICD-10-CM

## 2013-07-13 DIAGNOSIS — I4892 Unspecified atrial flutter: Secondary | ICD-10-CM

## 2013-07-13 NOTE — Progress Notes (Signed)
Echocardiogram performed.  

## 2013-07-15 ENCOUNTER — Ambulatory Visit (HOSPITAL_COMMUNITY)
Admission: RE | Admit: 2013-07-15 | Discharge: 2013-07-15 | Disposition: A | Discharge status: Home / Self Care | Payer: Medicare Other | Source: Ambulatory Visit | Attending: Cardiology | Admitting: Cardiology

## 2013-07-15 ENCOUNTER — Encounter (HOSPITAL_COMMUNITY)
Admission: RE | Disposition: A | Discharge status: Home / Self Care | Payer: Self-pay | Source: Ambulatory Visit | Attending: Cardiology

## 2013-07-15 ENCOUNTER — Encounter (HOSPITAL_COMMUNITY): Payer: Self-pay | Admitting: Gastroenterology

## 2013-07-15 ENCOUNTER — Encounter (HOSPITAL_COMMUNITY): Payer: Self-pay | Admitting: Certified Registered Nurse Anesthetist

## 2013-07-15 DIAGNOSIS — E785 Hyperlipidemia, unspecified: Secondary | ICD-10-CM | POA: Insufficient documentation

## 2013-07-15 DIAGNOSIS — I4891 Unspecified atrial fibrillation: Secondary | ICD-10-CM

## 2013-07-15 DIAGNOSIS — R7309 Other abnormal glucose: Secondary | ICD-10-CM | POA: Insufficient documentation

## 2013-07-15 DIAGNOSIS — K219 Gastro-esophageal reflux disease without esophagitis: Secondary | ICD-10-CM | POA: Insufficient documentation

## 2013-07-15 DIAGNOSIS — I059 Rheumatic mitral valve disease, unspecified: Secondary | ICD-10-CM

## 2013-07-15 HISTORY — PX: TEE WITHOUT CARDIOVERSION: SHX5443

## 2013-07-15 LAB — GLUCOSE, CAPILLARY: GLUCOSE-CAPILLARY: 125 mg/dL — AB (ref 70–99)

## 2013-07-15 SURGERY — ECHOCARDIOGRAM, TRANSESOPHAGEAL
Anesthesia: Moderate Sedation

## 2013-07-15 MED ORDER — FENTANYL CITRATE 0.05 MG/ML IJ SOLN
INTRAMUSCULAR | Status: AC
  Filled 2013-07-15: qty 2

## 2013-07-15 MED ORDER — BUTAMBEN-TETRACAINE-BENZOCAINE 2-2-14 % EX AERO
INHALATION_SPRAY | CUTANEOUS | Status: DC | PRN
  Administered 2013-07-15: 2 via TOPICAL

## 2013-07-15 MED ORDER — MIDAZOLAM HCL 5 MG/ML IJ SOLN
INTRAMUSCULAR | Status: AC
  Filled 2013-07-15: qty 2

## 2013-07-15 MED ORDER — SODIUM CHLORIDE 0.9 % IV SOLN
INTRAVENOUS | Status: DC
  Administered 2013-07-15: 500 mL via INTRAVENOUS

## 2013-07-15 MED ORDER — FENTANYL CITRATE 0.05 MG/ML IJ SOLN
INTRAMUSCULAR | Status: DC | PRN
  Administered 2013-07-15 (×2): 25 ug via INTRAVENOUS

## 2013-07-15 MED ORDER — MIDAZOLAM HCL 10 MG/2ML IJ SOLN
INTRAMUSCULAR | Status: DC | PRN
  Administered 2013-07-15: 2 mg via INTRAVENOUS
  Administered 2013-07-15 (×2): 1 mg via INTRAVENOUS

## 2013-07-15 NOTE — CV Procedure (Signed)
See full TEE report in [facility]; patient sedated with versed 4 mg and fentanyl 50 micrograms IV; normal LV function; no LAA thrombus. Kirk Ruths

## 2013-07-15 NOTE — H&P (View-Only) (Signed)
Clinical Summary Erika Lee is a 68 y.o.female recently evaluated with symptomatic paroxysmal atrial fibrillation. Most recent management has included Eliquis, Toprol-XL, and flecainide which was initiated in November. She underwent an exercise echocardiogram that demonstrated no definite echocardiographic evidence of ischemia.  I see that she was evaluated in the office on December 24 by Dr. Bronson Ing due to complaints of intermittent chest pain and cough associated with congestion and wheezing. She had been seen in the ER and diagnosed with possible URI. It was felt that there might be some possible correlation to being on flecainide, and this medication was discontinued.  She comes in to the office today without an appointment, demanding to be seen. She is here with her husband. She tells me that she feels weak and tired, still has brief palpitations but these did not correlate with her fatigue necessarily. She has not noted any major change since being off flecainide.  She does remain on Eliquis and Toprol-XL at very low dose. ECG shows sinus bradycardia at 57. She is not orthostatic on examination with no significant drop in blood pressure and stable heart rate.  She voices frustration with her current symptoms and states that she wants to feel better, would like to be evaluated for possible atrial fibrillation ablation. She mentioned to me the name of Dr. Thompson Grayer, had heard of him through family contacts.  I explained that I would be happy to make a referral to see him for EP consultation, although asked her to remain open to the possibility that different medications may be tried first prior to considering an ablation attempt. I also made it clear that it was not certain that all of her current symptomatology was related to either paroxysmal atrial fibrillation or her present medications based on objective findings. She could potentially be very sensitive to beta blocker with her  bradycardia however.   Allergies  Allergen Reactions  . Doxycycline Other (See Comments)    Makes heart race  . Sulfonamide Derivatives Nausea And Vomiting  . Adhesive [Tape] Rash  . Clindamycin/Lincomycin Rash    Current Outpatient Prescriptions  Medication Sig Dispense Refill  . apixaban (ELIQUIS) 5 MG TABS tablet Take 1 tablet (5 mg total) by mouth 2 (two) times daily.  60 tablet  12  . esomeprazole (NEXIUM) 40 MG capsule Take 40 mg by mouth daily before breakfast.        . hydrochlorothiazide 25 MG tablet Take 25 mg by mouth daily.        Marland Kitchen lisinopril (PRINIVIL,ZESTRIL) 10 MG tablet Take 10 mg by mouth daily.        Marland Kitchen loratadine (CLARITIN) 10 MG tablet Take 10 mg by mouth daily as needed for allergies.      . metFORMIN (GLUCOPHAGE) 500 MG tablet Take 500 mg by mouth daily.      . metoprolol succinate (TOPROL XL) 25 MG 24 hr tablet Take 0.5 tablets (12.5 mg total) by mouth daily.  60 tablet  3  . Omega-3 Fatty Acids (FISH OIL) 1000 MG CAPS Take 1,000 mg by mouth daily.       . Potassium Gluconate 550 MG TABS Take 1 tablet (550 mg total) by mouth 2 (two) times daily.  30 each  0  . simvastatin (ZOCOR) 40 MG tablet Take 40 mg by mouth at bedtime.        . sulfamethoxazole-trimethoprim (BACTRIM DS) 800-160 MG per tablet Take 1 tablet by mouth 2 (two) times daily. Started 06/17/13      .  thiamine (VITAMIN B-1) 100 MG tablet Take 100 mg by mouth daily.       No current facility-administered medications for this visit.    Past Medical History  Diagnosis Date  . Hyperlipidemia   . Multinodular goiter   . Atrial fibrillation     Documented 2008, spontaneously converted  . GERD (gastroesophageal reflux disease)   . Essential hypertension, benign   . Palpitations   . Diabetes mellitus     Borderline  . Arthritis     Past Surgical History  Procedure Laterality Date  . Biopsy thyroid    . Tonsillectomy    . Colonoscopy  09/10/2011    Procedure: COLONOSCOPY;  Surgeon: Jamesetta So, MD;  Location: [facility];  Service: Gastroenterology;  Laterality: N/A;  . Lesion removal N/A 05/03/2013    Procedure: EXCISION CYST, BACK;  Surgeon: Jamesetta So, MD;  Location: [facility];  Service: General;  Laterality: N/A;    Family History  Problem Relation Age of Onset  . Stroke Mother     Social History Erika Lee reports that she quit smoking about 19 years ago. Her smoking use included Cigarettes. She smoked 0.00 packs per day. She has never used smokeless tobacco. Erika Lee reports that she does not drink alcohol.  Review of Systems No further cough, no fevers or chills. No chest pain. No syncope. Otherwise as outlined.  Physical Examination Filed Vitals:   06/29/13 1451  BP: 130/79  Pulse: 59   Filed Weights   06/29/13 1451  Weight: 170 lb (77.111 kg)    Overweight woman in no acute distress.  HEENT: Conjunctiva and lids normal, oropharynx with moist mucosa.  Neck: Supple, no elevated JVP or carotid bruits.  Lungs: Clear to auscultation, nonlabored.  Cardiac: Regular rate and rhythm, soft systolic murmur at the base, no pericardial rub.  Abdomen: Soft, nontender, bowel sounds present.  Skin: Warm and dry.  Musculoskeletal: No kyphosis.  Extremities: No pitting edema.  Neuropsychiatric: Alert and oriented x3, affect appropriate.    Problem List and Plan   Paroxysmal atrial fibrillation For now we will continue Eliquis and low-dose Toprol-XL. Dr. Bronson Ing stopped the patient's flecainide last week, she has not noted any improvement in her fatigue and sense of shortness of breath, not entirely clear that this was related. Not certain that I would consider this a medication failure either. Having said that, she has requested referral for EP consultation, and I will have her see Dr. Rayann Heman next week at his earliest available appointment. She may need to be considered for a different antiarrhythmic treatment prior to considering ablation attempt,  although will defer to Dr. Jackalyn Lombard judgment and management. She voiced satisfaction with this plan, and will make no other medication adjustments at this time.  Sinus bradycardia Not entirely certain that this is symptomatic, however it is a possibility. Reluctant to discontinue Toprol-XL altogether given her episodes of very rapid atrial fibrillation that have already been documented. Please refer to the cardiac monitor strips in EPIC.  Tachycardia-bradycardia Remains a concern as far as treatment options for PAF.  Essential hypertension, benign Blood pressure is normal today, not orthostatic.    Satira Sark, M.D., F.A.C.C.

## 2013-07-15 NOTE — Interval H&P Note (Signed)
History and Physical Interval Note:  07/15/2013 9:31 AM  Erika Lee  has presented today for surgery, with the diagnosis of AFIB  The various methods of treatment have been discussed with the patient and family. After consideration of risks, benefits and other options for treatment, the patient has consented to  Procedure(s): TRANSESOPHAGEAL ECHOCARDIOGRAM (TEE) (N/A) as a surgical intervention .  The patient's history has been reviewed, patient examined, no change in status, stable for surgery.  I have reviewed the patient's chart and labs.  Questions were answered to the patient's satisfaction.     Kirk Ruths

## 2013-07-15 NOTE — Discharge Instructions (Addendum)
Moderate Sedation, Adult °Moderate sedation is given to help you relax or even sleep through a procedure. You may remain sleepy, be clumsy, or have poor balance for several hours following this procedure. Arrange for a responsible adult, family member, or friend to take you home. A responsible adult should stay with you for at least 24 hours or until the medicines have worn off. °· Do not participate in any activities where you could become injured for the next 24 hours, or until you feel normal again. Do not: °· Drive. °· Swim. °· Ride a bicycle. °· Operate heavy machinery. °· Cook. °· Use power tools. °· Climb ladders. °· Work at heights. °· Do not make important decisions or sign legal documents until you are improved. °· Vomiting may occur if you eat too soon. When you can drink without vomiting, try water, juice, or soup. Try solid foods if you feel little or no nausea. °· Only take over-the-counter or prescription medications for pain, discomfort, or fever as directed by your caregiver.If pain medications have been prescribed for you, ask your caregiver how soon it is safe to take them. °· Make sure you and your family fully understands everything about the medication given to you. Make sure you understand what side effects may occur. °· You should not drink alcohol, take sleeping pills, or medications that cause drowsiness for at least 24 hours. °· If you smoke, do not smoke alone. °· If you are feeling better, you may resume normal activities 24 hours after receiving sedation. °· Keep all appointments as scheduled. Follow all instructions. °· Ask questions if you do not understand. °SEEK MEDICAL CARE IF:  °· Your skin is pale or bluish in color. °· You continue to feel sick to your stomach (nauseous) or throw up (vomit). °· Your pain is getting worse and not helped by medication. °· You have bleeding or swelling. °· You are still sleepy or feeling clumsy after 24 hours. °SEEK IMMEDIATE MEDICAL CARE IF:   °· You develop a rash. °· You have difficulty breathing. °· You develop any type of allergic problem. °· You have a fever. °Document Released: 03/12/2001 Document Revised: 09/09/2011 Document Reviewed: 02/22/2013 °[employer]® Patient Information ©2014 [employer]. ° °

## 2013-07-15 NOTE — Progress Notes (Signed)
  Echocardiogram Echocardiogram Transesophageal has been performed.  ,  Erika Lee 07/15/2013, 10:18 AM

## 2013-07-16 ENCOUNTER — Ambulatory Visit (HOSPITAL_COMMUNITY)
Admission: RE | Admit: 2013-07-16 | Discharge: 2013-07-17 | Disposition: A | Discharge status: Home / Self Care | Payer: Medicare Other | Source: Ambulatory Visit | Attending: Internal Medicine | Admitting: Internal Medicine

## 2013-07-16 ENCOUNTER — Encounter (HOSPITAL_COMMUNITY): Payer: Self-pay | Admitting: Cardiology

## 2013-07-16 ENCOUNTER — Ambulatory Visit (HOSPITAL_COMMUNITY): Payer: Medicare Other | Admitting: Anesthesiology

## 2013-07-16 ENCOUNTER — Encounter (HOSPITAL_COMMUNITY): Payer: Medicare Other | Admitting: Anesthesiology

## 2013-07-16 ENCOUNTER — Encounter (HOSPITAL_COMMUNITY)
Admission: RE | Disposition: A | Discharge status: Home / Self Care | Payer: Self-pay | Source: Ambulatory Visit | Attending: Internal Medicine

## 2013-07-16 DIAGNOSIS — Z7901 Long term (current) use of anticoagulants: Secondary | ICD-10-CM | POA: Insufficient documentation

## 2013-07-16 DIAGNOSIS — E785 Hyperlipidemia, unspecified: Secondary | ICD-10-CM | POA: Insufficient documentation

## 2013-07-16 DIAGNOSIS — Z87891 Personal history of nicotine dependence: Secondary | ICD-10-CM | POA: Insufficient documentation

## 2013-07-16 DIAGNOSIS — I480 Paroxysmal atrial fibrillation: Secondary | ICD-10-CM | POA: Diagnosis present

## 2013-07-16 DIAGNOSIS — I4891 Unspecified atrial fibrillation: Secondary | ICD-10-CM

## 2013-07-16 DIAGNOSIS — I10 Essential (primary) hypertension: Secondary | ICD-10-CM

## 2013-07-16 DIAGNOSIS — E119 Type 2 diabetes mellitus without complications: Secondary | ICD-10-CM | POA: Insufficient documentation

## 2013-07-16 DIAGNOSIS — E042 Nontoxic multinodular goiter: Secondary | ICD-10-CM | POA: Insufficient documentation

## 2013-07-16 DIAGNOSIS — K219 Gastro-esophageal reflux disease without esophagitis: Secondary | ICD-10-CM | POA: Insufficient documentation

## 2013-07-16 DIAGNOSIS — I4892 Unspecified atrial flutter: Secondary | ICD-10-CM | POA: Insufficient documentation

## 2013-07-16 HISTORY — PX: ATRIAL FIBRILLATION ABLATION: SHX5456

## 2013-07-16 LAB — GLUCOSE, CAPILLARY
GLUCOSE-CAPILLARY: 130 mg/dL — AB (ref 70–99)
GLUCOSE-CAPILLARY: 114 mg/dL — AB (ref 70–99)
GLUCOSE-CAPILLARY: 179 mg/dL — AB (ref 70–99)
Glucose-Capillary: 109 mg/dL — ABNORMAL HIGH (ref 70–99)

## 2013-07-16 LAB — POCT ACTIVATED CLOTTING TIME
ACTIVATED CLOTTING TIME: 265 s
ACTIVATED CLOTTING TIME: 299 s
Activated Clotting Time: 243 seconds
Activated Clotting Time: 155 seconds

## 2013-07-16 LAB — MRSA PCR SCREENING: MRSA BY PCR: NEGATIVE

## 2013-07-16 SURGERY — ATRIAL FIBRILLATION ABLATION
Anesthesia: Monitor Anesthesia Care

## 2013-07-16 MED ORDER — LISINOPRIL 10 MG PO TABS
10.0000 mg | ORAL_TABLET | Freq: Every day | ORAL | Status: DC
  Administered 2013-07-16 – 2013-07-17 (×2): 10 mg via ORAL
  Filled 2013-07-16 (×2): qty 1

## 2013-07-16 MED ORDER — FENTANYL CITRATE 0.05 MG/ML IJ SOLN: 25.0000 ug | INTRAMUSCULAR | Status: DC | PRN

## 2013-07-16 MED ORDER — LIDOCAINE 4 % EX CREA
TOPICAL_CREAM | Freq: Three times a day (TID) | CUTANEOUS | Status: DC | PRN
  Administered 2013-07-16 – 2013-07-17 (×2): 1 via TOPICAL
  Filled 2013-07-16: qty 5

## 2013-07-16 MED ORDER — ONDANSETRON HCL 4 MG/2ML IJ SOLN: 4.0000 mg | Freq: Four times a day (QID) | INTRAMUSCULAR | Status: DC | PRN

## 2013-07-16 MED ORDER — HYDROCODONE-ACETAMINOPHEN 5-325 MG PO TABS
1.0000 | ORAL_TABLET | ORAL | Status: DC | PRN
  Administered 2013-07-16 – 2013-07-17 (×2): 2 via ORAL
  Filled 2013-07-16: qty 2

## 2013-07-16 MED ORDER — HEPARIN SODIUM (PORCINE) 1000 UNIT/ML IJ SOLN
INTRAMUSCULAR | Status: DC | PRN
  Administered 2013-07-16: 3000 [IU] via INTRAVENOUS
  Administered 2013-07-16: 4000 [IU] via INTRAVENOUS

## 2013-07-16 MED ORDER — FENTANYL CITRATE 0.05 MG/ML IJ SOLN
INTRAMUSCULAR | Status: DC | PRN
  Administered 2013-07-16 (×6): 25 ug via INTRAVENOUS
  Administered 2013-07-16: 50 ug via INTRAVENOUS
  Administered 2013-07-16 (×2): 25 ug via INTRAVENOUS
  Administered 2013-07-16: 50 ug via INTRAVENOUS

## 2013-07-16 MED ORDER — INSULIN ASPART 100 UNIT/ML ~~LOC~~ SOLN
0.0000 [IU] | Freq: Three times a day (TID) | SUBCUTANEOUS | Status: DC
  Administered 2013-07-17: 1 [IU] via SUBCUTANEOUS

## 2013-07-16 MED ORDER — HEPARIN SODIUM (PORCINE) 1000 UNIT/ML IJ SOLN
INTRAMUSCULAR | Status: AC
  Filled 2013-07-16: qty 1

## 2013-07-16 MED ORDER — SODIUM CHLORIDE 0.9 % IV SOLN
INTRAVENOUS | Status: DC | PRN
  Administered 2013-07-16 (×2): via INTRAVENOUS

## 2013-07-16 MED ORDER — HYDROCORTISONE 0.5 % EX CREA
TOPICAL_CREAM | CUTANEOUS | Status: DC | PRN
  Filled 2013-07-16: qty 28.35

## 2013-07-16 MED ORDER — PROTAMINE SULFATE 10 MG/ML IV SOLN
INTRAVENOUS | Status: DC | PRN
  Administered 2013-07-16: 40 mg via INTRAVENOUS

## 2013-07-16 MED ORDER — ACETAMINOPHEN 325 MG PO TABS: 650.0000 mg | ORAL_TABLET | ORAL | Status: DC | PRN

## 2013-07-16 MED ORDER — SODIUM CHLORIDE 0.9 % IV SOLN: INTRAVENOUS | Status: DC

## 2013-07-16 MED ORDER — MIDAZOLAM HCL 5 MG/5ML IJ SOLN
INTRAMUSCULAR | Status: DC | PRN
  Administered 2013-07-16 (×2): 1 mg via INTRAVENOUS

## 2013-07-16 MED ORDER — SODIUM CHLORIDE 0.9 % IV SOLN
250.0000 mL | INTRAVENOUS | Status: DC | PRN
Start: 2013-07-16 — End: 2013-07-17

## 2013-07-16 MED ORDER — SODIUM CHLORIDE 0.9 % IJ SOLN: 3.0000 mL | INTRAMUSCULAR | Status: DC | PRN

## 2013-07-16 MED ORDER — SODIUM CHLORIDE 0.9 % IJ SOLN
3.0000 mL | Freq: Two times a day (BID) | INTRAMUSCULAR | Status: DC
  Administered 2013-07-16 – 2013-07-17 (×3): 3 mL via INTRAVENOUS

## 2013-07-16 MED ORDER — PROPOFOL INFUSION 10 MG/ML OPTIME
INTRAVENOUS | Status: DC | PRN
  Administered 2013-07-16: 50 ug/kg/min via INTRAVENOUS

## 2013-07-16 MED ORDER — APIXABAN 5 MG PO TABS
5.0000 mg | ORAL_TABLET | Freq: Two times a day (BID) | ORAL | Status: DC
  Administered 2013-07-16 – 2013-07-17 (×2): 5 mg via ORAL
  Filled 2013-07-16 (×3): qty 1

## 2013-07-16 NOTE — Progress Notes (Signed)
Utilization Review Completed.,  T1/16/2015  

## 2013-07-16 NOTE — Progress Notes (Signed)
S/p afib ablation today  I anticipate discharge to home tomorrow early am. Resume home medicines including eliquis (without interruption)  Follow-up with me in 12 weeks. Routine groin management post ablation.  She should call my office if any questions arise.

## 2013-07-16 NOTE — Discharge Summary (Signed)
ELECTROPHYSIOLOGY PROCEDURE DISCHARGE SUMMARY    Patient ID: Erika Lee,  MRN: [id number], DOB/AGE: 70-Mar-1947 70 y.o.  Admit date: 07/16/2013 Discharge date: 07/17/2013  Primary Care Physician: Alonza Bogus, MD Primary Cardiologist: Johnny Bridge, MD Electrophysiologist: Thompson Grayer, MD  Primary Discharge Diagnosis:  Atrial flutter and atrial fibrillation status post ablation this admission  Secondary Discharge Diagnosis:  1.  Hypertension 2.  Hyperlipidemia 3.  Diabetes 4.  GERD  Procedures This Admission:  1.  Electrophysiology study and radiofrequency catheter ablation on 07-16-2013 by Dr Thompson Grayer.  This study demonstrated sinus rhythm upon presentation, rotational Angiography reveals a moderate sized left atrium with four separate pulmonary veins without evidence of pulmonary vein stenosis. The inferior pulmonary veins were very close together in their position; successful electrical isolation and anatomical encircling of all four pulmonary veins with radiofrequency current; cavo-tricuspid isthmus ablation was performed with complete bidirectional isthmus block achieved. There were no inducible arrhythmias following ablation.  There were no early apparent complications.   Brief HPI: Erika Lee is a 68 y.o. female with a history of atrial fibrillation and atrial flutter.  They have failed medical therapy with Flecainide and Metoprolol. Risks, benefits, and alternatives to catheter ablation of atrial fibrillation were reviewed with the patient who wished to proceed.  The patient underwent TEE prior to the procedure which demonstrated normal LV function and no LAA thrombus.    Hospital Course:  The patient was admitted and underwent EPS/RFCA of atrial fibrillation with details as outlined above.  They were monitored on telemetry overnight which demonstrated NSR.  Groin was without complication on the day of discharge.  She did report some mild esophageal irritation the  morning of discharge.  She will call if not cleared in two days.  The patient was examined by Dr. Sallyanne Kuster and considered to be stable for discharge.  Wound care and restrictions were reviewed with the patient.  The patient will be seen back by [name] in 12 weeks for post ablation follow up (the office will call with this appointment).   Discharge Vitals: Blood pressure 126/60, pulse 62, temperature 98.7 F (37.1 C), temperature source Oral, resp. rate 17, height 5\' 5"  (1.651 m), weight 174 lb 6.1 oz (79.1 kg), SpO2 92.00%.   Labs:   Lab Results  Component Value Date   WBC 7.6 07/07/2013   HGB 13.7 07/07/2013   HCT 39.9 07/07/2013   MCV 87.3 07/07/2013   PLT 201.0 07/07/2013     Recent Labs Lab 07/17/13 0345  NA 138  K 3.8  CL 103  CO2 25  BUN 7  CREATININE 0.73  CALCIUM 9.0  GLUCOSE 133*      Discharge Medications:    Medication List         apixaban 5 MG Tabs tablet  Commonly known as:  ELIQUIS  Take 1 tablet (5 mg total) by mouth 2 (two) times daily.     esomeprazole 40 MG capsule  Commonly known as:  NEXIUM  Take 40 mg by mouth daily before breakfast.     Fish Oil 1000 MG Caps  Take 1,000 mg by mouth daily.     hydrochlorothiazide 25 MG tablet  Commonly known as:  HYDRODIURIL  Take 25 mg by mouth daily.     lisinopril 10 MG tablet  Commonly known as:  PRINIVIL,ZESTRIL  Take 10 mg by mouth daily.     loratadine 10 MG tablet  Commonly known as:  CLARITIN  Take 10 mg  by mouth daily as needed for allergies.     metFORMIN 500 MG tablet  Commonly known as:  GLUCOPHAGE  Take 1 tablet (500 mg total) by mouth daily.     metoprolol succinate 25 MG 24 hr tablet  Commonly known as:  TOPROL XL  Take 0.5 tablets (12.5 mg total) by mouth daily.     Potassium Gluconate 550 MG Tabs  Take 1 tablet (550 mg total) by mouth 2 (two) times daily.     simvastatin 40 MG tablet  Commonly known as:  ZOCOR  Take 40 mg by mouth at bedtime.     thiamine 100 MG tablet    Commonly known as:  VITAMIN B-1  Take 100 mg by mouth daily.        Disposition:  Discharge Orders   Future Orders Complete By Expires   Call MD for:  redness, tenderness, or signs of infection (pain, swelling, redness, odor or green/yellow discharge around incision site)  As directed    Comments:     At the cath site.   Diet - low sodium heart healthy  As directed    Increase activity slowly  As directed      Follow-up Information   Follow up with Thompson Grayer, MD. (The office will call you withthe appt date and time.)    Specialty:  Cardiology   Contact information:   [place] [id number] [phone]       Duration of Discharge Encounter: Greater than 30 minutes including physician time.  Signed, ,  11:34 AM

## 2013-07-16 NOTE — Interval H&P Note (Signed)
History and Physical Interval Note:  07/16/2013 7:30 AM  Erika Lee  has presented today for surgery, with the diagnosis of afib  The various methods of treatment have been discussed with the patient and family. After consideration of risks, benefits and other options for treatment, the patient has consented to  Procedure(s): ATRIAL FIBRILLATION ABLATION (N/A) as a surgical intervention .  The patient's history has been reviewed, patient examined, no change in status, stable for surgery.  I have reviewed the patient's chart and labs.  Questions were answered to the patient's satisfaction.     Thompson Grayer

## 2013-07-16 NOTE — Preoperative (Signed)
Beta Blockers   Reason not to administer Beta Blockers:Not Applicable 

## 2013-07-16 NOTE — Anesthesia Postprocedure Evaluation (Signed)
  Anesthesia Post-op Note  Patient: Erika Lee  Procedure(s) Performed: Procedure(s): ATRIAL FIBRILLATION ABLATION (N/A)  Patient Location: PACU  Anesthesia Type:Mac  Level of Consciousness: awake  Airway and Oxygen Therapy: Patient Spontanous Breathing  Post-op Pain: mild  Post-op Assessment: Post-op Vital signs reviewed  Post-op Vital Signs: Reviewed  Complications: No apparent anesthesia complications

## 2013-07-16 NOTE — Anesthesia Preprocedure Evaluation (Addendum)
Anesthesia Evaluation  Patient identified by MRN, date of birth, ID band Patient awake    History of Anesthesia Complications (+) PONV  Airway Mallampati: I      Dental   Pulmonary former smoker,  breath sounds clear to auscultation        Cardiovascular hypertension, + dysrhythmias Atrial Fibrillation Rhythm:Irregular Rate:Normal     Neuro/Psych    GI/Hepatic Neg liver ROS, GERD-  ,  Endo/Other  diabetes  Renal/GU      Musculoskeletal   Abdominal   Peds  Hematology   Anesthesia Other Findings   Reproductive/Obstetrics                          Anesthesia Physical Anesthesia Plan  ASA: III  Anesthesia Plan: MAC   Post-op Pain Management:    Induction: Intravenous  Airway Management Planned: Simple Face Mask  Additional Equipment:   Intra-op Plan:   Post-operative Plan: Extubation in OR  Informed Consent:   Plan Discussed with: CRNA and Anesthesiologist  Anesthesia Plan Comments:        Anesthesia Quick Evaluation

## 2013-07-16 NOTE — Transfer of Care (Signed)
Immediate Anesthesia Transfer of Care Note  Patient: Erika Lee  Procedure(s) Performed: Procedure(s): ATRIAL FIBRILLATION ABLATION (N/A)  Patient Location: PACU and Cath Lab  Anesthesia Type:MAC  Level of Consciousness: awake, alert  and oriented  Airway & Oxygen Therapy: Patient Spontanous Breathing  Post-op Assessment: Report given to PACU RN  Post vital signs: Reviewed and stable  Complications: No apparent anesthesia complications

## 2013-07-16 NOTE — Op Note (Signed)
SURGEON:  Thompson Grayer, MD  PREPROCEDURE DIAGNOSES: 1. Paroxysmal atrial fibrillation. 2. Typical appearing atrial flutter  POSTPROCEDURE DIAGNOSES: 1. Paroxysmal  atrial fibrillation. 2. Typical appearing atrial flutter  PROCEDURES: 1. Comprehensive electrophysiologic study. 2. Coronary sinus pacing and recording. 3. Three-dimensional mapping of atrial fibrillation with additional mapping and ablation of a second discrete focus (atrial flutter) 4. Ablation of atrial fibrillation with additional mapping and ablation of a second discrete focus (atrial flutter) 5. Intracardiac echocardiography. 6. Transseptal puncture of an intact septum. 7. Rotational Angiography with processing at an independent workstation 8. External cardioversion  INTRODUCTION:  Erika Lee is a 68 y.o. female with a history of paroxysmal atrial fibrillation and typical appearing atrial flutter who now presents for EP study and radiofrequency ablation.  The patient reports initially being diagnosed with atrial fibrillation after presenting with symptomatic palpitations and fatgiue. The patient reports increasing frequency and duration of atrial arrhythmias since that time.  The patient has failed medical therapy with metoprolol and flecainide.  The patient therefore presents today for catheter ablation of atrial fibrillation and atrial flutter.  DESCRIPTION OF PROCEDURE:  Informed written consent was obtained, and the patient was brought to the electrophysiology lab in a fasting state.  The patient was adequately sedated with intravenous medications as outlined in the anesthesia report.  The patient's left and right groins were prepped and draped in the usual sterile fashion by the EP lab staff.  Using a percutaneous Seldinger technique, two 7-French and one 11-French hemostasis sheaths were placed into the right common femoral vein.    Catheter Placement:  A 7-French Biosense Webster Decapolar coronary sinus catheter  was introduced through the right common femoral vein and advanced into the coronary sinus for recording and pacing from this location.  A quadrapolar catheter was introduced through the right common femoral vein and advanced into the right ventricle for recording and pacing.  This catheter was then pulled back to the His bundle location.    Initial Measurements: The patient presented to the electrophysiology lab in sinus rhythm.  The patients PR interval measured 174 msec with a QRS duration of 98 msec and a QT interval of 424 msec.  The AH interval measured 106 msec and the HV interval measured 40 msec.     Intracardiac Echocardiography: A 10-French Biosense Webster AcuNav intracardiac echocardiography catheter was introduced through the left common femoral vein and advanced into the right atrium. Intracardiac echocardiography was performed of the left atrium, and a three-dimensional anatomical rendering of the left atrium was performed using CARTO sound technology.  The patient was noted to have a moderate sized left atrium.  The interatrial septum was prominent but not aneurysmal. All 4 pulmonary veins were visualized and noted to have separate ostia.  The inferior veins were very close together in their positioning.   The pulmonary veins were moderate in size.  The left atrial appendage was visualized and did not reveal thrombus.   There was no evidence of pulmonary vein stenosis.   Transseptal Puncture: The middle right common femoral vein sheath was exchanged for an 8.5 [place] SL2 transseptal sheath and transseptal access was achieved in a standard fashion using a Brockenbrough needle under biplane fluoroscopy with intracardiac echocardiography confirmation of the transseptal puncture.  Once transseptal access had been achieved, heparin was administered intravenously and intra- arterially in order to maintain an ACT of greater than 300 seconds throughout the procedure.   3D Mapping and  Ablation: The His bundle catheter was removed and  in its place a 3.5 mm Biosense Webster SmartTouch Thermocool ablation catheter was advanced into the right atrium.  The transseptal sheath was pulled back into the IVC over a guidewire.  The ablation catheter was advanced across the transseptal hole using the wire as a guide.  The transseptal sheath was then re-advanced over the guidewire into the left atrium.  A duodecapolar Biosense Webster circular mapping catheter was introduced through the transseptal sheath and positioned over the mouth of all 4 pulmonary veins.  Three-dimensional electroanatomical mapping was performed using CARTO technology.  This demonstrated electrical activity within all four pulmonary veins at baseline. The patient underwent successful sequential electrical isolation and anatomical encircling of all four pulmonary veins using radiofrequency current with a circular mapping catheter as a guide.   The ablation catheter was then exchanged for a 78F biosence webster 8MM  Flutter catheter.  This catheter was then placed into the right atrial and positioned along the cavo-tricuspid isthmus.  Mapping along the atrial side of the isthmus was performed.  This demonstrated a standard isthmus.  A series of radiofrequency applications were then delivered along the isthmus.  Complete bidirectional cavotricuspid isthmus block was achieved as confirmed by differential atrial pacing from the low lateral right atrium.  A stimulus to earliest atrial activation across the isthmus measured 170 msec bi-directionally.  The patient was observe without return of conduction through the isthmus.  The patient developed afib with catheter manipulation within the right atrium.  She was successfully cardioverted to sinus rhythm with cardioversion electrodes in the anterior posterior configuration.  Measurements Following Ablation: In sinus rhythm with RR interval was 652 msec, with PR 190 msec, QRS 105 msec, and Qt  418 msec.  Following ablation the AH interval measured 112 msec with an HV interval of 44 msec. Ventricular pacing was performed, which revealed VA dissociation when pacing at 600 msec.  Rapid atrial pacing was performed, which revealed an AV Wenckebach cycle length of 360 msec.  Electroisolation was then again confirmed in all four pulmonary veins.  Intracardiac echocardiography was again performed, which revealed no pericardial effusion.  The procedure was therefore considered completed.  All catheters were removed, and the sheaths were aspirated and flushed.  The patient was transferred to the recovery area for sheath removal per protocol.  A limited bedside transthoracic echocardiogram revealed no pericardial effusion.  There were no early apparent complications.  CONCLUSIONS: 1. Sinus rhythm upon presentation.   2. Rotational Angiography reveals a moderate sized left atrium with four separate pulmonary veins without evidence of pulmonary vein stenosis.  The inferior pulmonary veins were very close together in their position. 3. Successful electrical isolation and anatomical encircling of all four pulmonary veins with radiofrequency current.    4. Cavo-tricuspid isthmus ablation was performed with complete bidirectional isthmus block achieved.  5. No inducible arrhythmias following ablation  6. No early apparent complications.   Jeneen Rinks ,MD 10:53 AM 07/16/2013

## 2013-07-16 NOTE — H&P (View-Only) (Signed)
Primary Care Physician: Alonza Bogus, MD Referring Physician: Johnny Bridge, MD   Erika Lee is a 68 y.o. female with a h/o hypertension, hyperlipidemia, and diabetes.  There is a remote history of atrial fibrillation but the patient does not remember ever being diagnosed.  Several weeks ago she developed tachypalpitations and shortness of breath.  She went to [facility] for evaluation and was found to be in initially atrial flutter with RVR and subsequently degenerated to afib with RVR.  She developed pre-syncope with her tachycardia and was given IV Cardizem which converted her to SR.  She was seen by cardiology that admission and was placed on Flecainide and Metoprolol.  She felt that the medications were causing weakness and fatigue and followed up with Dr Bronson Ing who discontinued her Flecainide.  She then followed up with Dr Domenic Polite after having persistent spells of atrial fibrillation and was referred today for further evaluation.  She is appropriately anticoagulated with Eliquis.  She has had a stress echo but not a 2D echo.   Today, she denies symptoms of palpitations, chest pain, shortness of breath, orthopnea, PND, lower extremity edema, dizziness, presyncope, syncope, or neurologic sequela. The patient is tolerating medications without difficulties and is otherwise without complaint today.   Past Medical History  Diagnosis Date  . Hyperlipidemia   . Multinodular goiter   . Paroxysmal atrial fibrillation   . GERD (gastroesophageal reflux disease)   . Essential hypertension, benign   . Palpitations   . Diabetes mellitus     Borderline  . Arthritis   . Atrial flutter    Past Surgical History  Procedure Laterality Date  . Biopsy thyroid    . Tonsillectomy    . Colonoscopy  09/10/2011    Procedure: COLONOSCOPY;  Surgeon: Jamesetta So, MD;  Location: [facility];  Service: Gastroenterology;  Laterality: N/A;  . Lesion removal N/A 05/03/2013    Procedure: EXCISION CYST,  BACK;  Surgeon: Jamesetta So, MD;  Location: [facility];  Service: General;  Laterality: N/A;    Current Outpatient Prescriptions  Medication Sig Dispense Refill  . apixaban (ELIQUIS) 5 MG TABS tablet Take 1 tablet (5 mg total) by mouth 2 (two) times daily.  60 tablet  12  . esomeprazole (NEXIUM) 40 MG capsule Take 40 mg by mouth daily before breakfast.        . hydrochlorothiazide 25 MG tablet Take 25 mg by mouth daily.        Marland Kitchen lisinopril (PRINIVIL,ZESTRIL) 10 MG tablet Take 10 mg by mouth daily.        Marland Kitchen loratadine (CLARITIN) 10 MG tablet Take 10 mg by mouth daily as needed for allergies.      . metFORMIN (GLUCOPHAGE) 500 MG tablet Take 500 mg by mouth daily.      . metoprolol succinate (TOPROL XL) 25 MG 24 hr tablet Take 0.5 tablets (12.5 mg total) by mouth daily.  60 tablet  3  . Omega-3 Fatty Acids (FISH OIL) 1000 MG CAPS Take 1,000 mg by mouth daily.       . Potassium Gluconate 550 MG TABS Take 1 tablet (550 mg total) by mouth 2 (two) times daily.  30 each  0  . simvastatin (ZOCOR) 40 MG tablet Take 40 mg by mouth at bedtime.        . thiamine (VITAMIN B-1) 100 MG tablet Take 100 mg by mouth daily.       No current facility-administered medications for this visit.  Allergies  Allergen Reactions  . Clindamycin/Lincomycin Rash  . Doxycycline Other (See Comments)    Makes heart race  . Adhesive [Tape] Rash  . Sulfonamide Derivatives Nausea And Vomiting    History   Social History  . Marital Status: Married    Spouse Name: N/A    Number of Children: N/A  . Years of Education: N/A   Occupational History  . Trucking Business     Full time   Social History Main Topics  . Smoking status: Former Smoker    Types: Cigarettes    Quit date: 11/29/1993  . Smokeless tobacco: Never Used  . Alcohol Use: No  . Drug Use: No  . Sexual Activity: Not on file   Other Topics Concern  . Not on file   Social History Narrative   Lives with spouse in [place]    Family History    Problem Relation Age of Onset  . Stroke Mother     ROS- All systems are reviewed and negative except as per the HPI above  Physical Exam: Filed Vitals:   07/07/13 0809  BP: 140/83  Pulse: 62  Height: 5\' 4"  (1.626 m)  Weight: 174 lb 1.9 oz (78.98 kg)    GEN- The patient is well appearing, alert and oriented x 3 today.   Head- normocephalic, atraumatic Eyes-  Sclera clear, conjunctiva pink Ears- hearing intact Oropharynx- clear Neck- supple, no JVP Lymph- no cervical lymphadenopathy Lungs- Clear to ausculation bilaterally, normal work of breathing Heart- Regular rate and rhythm, no murmurs, rubs or gallops, PMI not laterally displaced GI- soft, NT, ND, + BS Extremities- no clubbing, cyanosis, or edema MS- no significant deformity or atrophy Skin- no rash or lesion Psych- euthymic mood, full affect Neuro- strength and sensation are intact  EKG- sinus rhythm rate 62, normal intervals Event monitor is reviewed Stress test is reviewed  Assessment and Plan:  1. Atrial fibrillation and atrial flutter The patient has symptomatic typical appearing atrial flutter and afib.  She has failed medical therapy with flecainide and metoprolol.  Therapeutic strategies for afib and atrial flutter including medicine and ablation were discussed in detail with the patient today. Risk, benefits, and alternatives to EP study and radiofrequency ablation for afib were also discussed in detail today. These risks include but are not limited to stroke, bleeding, vascular damage, tamponade, perforation, damage to the esophagus, lungs, and other structures, pulmonary vein stenosis, worsening renal function, and death. The patient understands these risk and wishes to proceed.  We will therefore proceed with catheter ablation at the next available time.  We will obtain a 2 D echo to evaluate LA size and exclude valvular heart disease.  Chads2vasc score is at least 4.  She should therefore continue long term  anticoagulation.  2. HTN Stable No change required today

## 2013-07-17 ENCOUNTER — Encounter (HOSPITAL_COMMUNITY): Payer: Self-pay | Admitting: *Deleted

## 2013-07-17 DIAGNOSIS — I4892 Unspecified atrial flutter: Secondary | ICD-10-CM

## 2013-07-17 DIAGNOSIS — K219 Gastro-esophageal reflux disease without esophagitis: Secondary | ICD-10-CM

## 2013-07-17 DIAGNOSIS — I4891 Unspecified atrial fibrillation: Secondary | ICD-10-CM

## 2013-07-17 LAB — BASIC METABOLIC PANEL
BUN: 7 mg/dL (ref 6–23)
CALCIUM: 9 mg/dL (ref 8.4–10.5)
CO2: 25 mEq/L (ref 19–32)
Chloride: 103 mEq/L (ref 96–112)
Creatinine, Ser: 0.73 mg/dL (ref 0.50–1.10)
GFR calc Af Amer: >90 mL/min (ref >90)
GFR, EST NON AFRICAN AMERICAN: 85 mL/min — AB (ref >90)
Glucose, Bld: 133 mg/dL — ABNORMAL HIGH (ref 70–99)
Potassium: 3.8 mEq/L (ref 3.7–5.3)
SODIUM: 138 meq/L (ref 137–147)

## 2013-07-17 LAB — GLUCOSE, CAPILLARY: GLUCOSE-CAPILLARY: 141 mg/dL — AB (ref 70–99)

## 2013-07-17 MED ORDER — METFORMIN HCL 500 MG PO TABS: 500.0000 mg | ORAL_TABLET | Freq: Every day | ORAL | Status: DC

## 2013-07-17 NOTE — Progress Notes (Signed)
Subjective: Esophagus feels a little irritated.  Slight cough.   Objective: Vital signs in last 24 hours: Temp:  [97.3 F (36.3 C)-99.1 F (37.3 C)] 98.7 F (37.1 C) (01/17 0812) Pulse Rate:  [62] 62 (01/16 1600) Resp:  [12-21] 17 (01/17 0850) BP: (84-126)/(47-86) 126/60 mmHg (01/17 0850) SpO2:  [92 %-97 %] 92 % (01/17 0812) Weight:  [174 lb 6.1 oz (79.1 kg)] 174 lb 6.1 oz (79.1 kg) (01/16 1230) Last BM Date: 07/15/13  Intake/Output from previous day: 01/16 0701 - 01/17 0700 In: 2053 [P.O.:550; I.V.:1503] Out: 400 [Urine:400] Intake/Output this shift:    Medications Current Facility-Administered Medications  Medication Dose Route Frequency Provider Last Rate Last Dose  . 0.9 %  sodium chloride infusion  250 mL Intravenous PRN Thompson Grayer, MD      . acetaminophen (TYLENOL) tablet 650 mg  650 mg Oral Q4H PRN Thompson Grayer, MD      . apixaban (ELIQUIS) tablet 5 mg  5 mg Oral BID Thompson Grayer, MD   5 mg at 07/17/13 0849  . HYDROcodone-acetaminophen (NORCO/VICODIN) 5-325 MG per tablet 1-2 tablet  1-2 tablet Oral Q4H PRN Thompson Grayer, MD   2 tablet at 07/17/13 0318  . hydrocortisone cream 0.5 %   Topical PRN Thompson Grayer, MD      . insulin aspart (novoLOG) injection 0-9 Units  0-9 Units Subcutaneous TID WC Thompson Grayer, MD   1 Units at 07/17/13 0849  . lidocaine (LMX) 4 % cream   Topical TID PRN Inez Pilgrim, MD   1 application at 22/02/54 0444  . lisinopril (PRINIVIL,ZESTRIL) tablet 10 mg  10 mg Oral Daily Thompson Grayer, MD   10 mg at 07/17/13 0850  . ondansetron (ZOFRAN) injection 4 mg  4 mg Intravenous Q6H PRN Thompson Grayer, MD      . sodium chloride 0.9 % injection 3 mL  3 mL Intravenous Q12H Thompson Grayer, MD   3 mL at 07/16/13 2206  . sodium chloride 0.9 % injection 3 mL  3 mL Intravenous PRN Thompson Grayer, MD        PE: General appearance: alert, cooperative and no distress Lungs: clear to auscultation bilaterally Heart: regular rate and rhythm, S1, S2 normal, no  murmur, click, rub or gallop Extremities: no LEE Pulses: 2+ and symmetric Skin: Warm and dry.  Tender in the right groin. no ecchymosis or hematoma'. Neurologic: Grossly normal  Lab Results:  No results found for this basename: WBC, HGB, HCT, PLT,  in the last 72 hours BMET  Recent Labs  07/17/13 0345  NA 138  K 3.8  CL 103  CO2 25  GLUCOSE 133*  BUN 7  CREATININE 0.73  CALCIUM 9.0    Assessment/Plan  Active Problems:   Paroxysmal atrial fibrillation   Atrial flutter   Atrial fibrillation  Plan:  SP afib ablation.  Maintaining NSR.  BP and HR stable.  Ambulate in the hall and DC today.   LOS: 1 day    HAGER, BRYAN 07/17/2013 9:43 AM  I have seen and examined the patient along with Tarri Fuller, PA.  I have reviewed the chart, notes and new data.  I agree with PA's note.  Key new complaints: mild mid chest discomfort, consider pericarditis; does not sound like serious esophageal complication. ECG without new ST changes Key examination changes: clear lungs, no pleural or pericardial rub; no groin access site problems Key new findings / data: NSR, RBBB  PLAN: DC home. Call if chest discomfort  or dysphagia worsen/fail to resolve over the next couple of days.  Sanda Klein, MD, [place] [phone] 07/17/2013, 11:04 AM

## 2013-07-20 ENCOUNTER — Encounter: Payer: Self-pay | Admitting: *Deleted

## 2013-07-20 ENCOUNTER — Telehealth: Payer: Self-pay | Admitting: Internal Medicine

## 2013-07-20 NOTE — Telephone Encounter (Signed)
New Prob   Pt says she is experiencing dysphagia, pain to her the right side of esophagus at rest and when she eats, fatigue, and a cough. Pt would like to speak to nurse regarding her symptoms. Pt says she saw Dr. Rayann Heman in the hospital and he advised her to call the office if her symptoms did not improve by today.

## 2013-07-20 NOTE — Telephone Encounter (Signed)
Spoke with patient and she is still experiencing pain with and without swallowing.  She also has a cough at times.  She was told to call the office if not improved by today.  I have added her on to see [name] on 07/21/13 at 12:15.  She is able to eat and drink but states her throat feels "raw" from mid way down her esophagus to almost her stomach.  She is eating and drinking but is having to cut food up in small pieces.  I let her know I would try and get in touch with [name] today and call her back but it could be tomorrow.  She is fine with this and appreciates the follow up appointment

## 2013-07-22 ENCOUNTER — Ambulatory Visit (INDEPENDENT_AMBULATORY_CARE_PROVIDER_SITE_OTHER)
Admission: RE | Admit: 2013-07-22 | Discharge: 2013-07-22 | Disposition: A | Discharge status: Home / Self Care | Payer: Medicare Other | Source: Ambulatory Visit | Attending: Internal Medicine | Admitting: Internal Medicine

## 2013-07-22 ENCOUNTER — Ambulatory Visit (INDEPENDENT_AMBULATORY_CARE_PROVIDER_SITE_OTHER): Payer: Medicare Other | Admitting: Internal Medicine

## 2013-07-22 ENCOUNTER — Encounter: Payer: Self-pay | Admitting: Internal Medicine

## 2013-07-22 VITALS — BP 118/74 | HR 73 | Ht 65.0 in | Wt 175.0 lb

## 2013-07-22 DIAGNOSIS — R1310 Dysphagia, unspecified: Secondary | ICD-10-CM

## 2013-07-22 DIAGNOSIS — R05 Cough: Secondary | ICD-10-CM

## 2013-07-22 DIAGNOSIS — R059 Cough, unspecified: Secondary | ICD-10-CM

## 2013-07-22 DIAGNOSIS — I4891 Unspecified atrial fibrillation: Secondary | ICD-10-CM

## 2013-07-22 LAB — BASIC METABOLIC PANEL
BUN: 10 mg/dL (ref 6–23)
CHLORIDE: 94 meq/L — AB (ref 96–112)
CO2: 30 meq/L (ref 19–32)
Calcium: 9.7 mg/dL (ref 8.4–10.5)
Creatinine, Ser: 0.8 mg/dL (ref 0.4–1.2)
GFR: 72.3 mL/min (ref >60.00)
Glucose, Bld: 108 mg/dL — ABNORMAL HIGH (ref 70–99)
Potassium: 3.7 mEq/L (ref 3.5–5.1)
Sodium: 132 mEq/L — ABNORMAL LOW (ref 135–145)

## 2013-07-22 LAB — CBC WITH DIFFERENTIAL/PLATELET
BASOS PCT: 0.5 % (ref 0.0–3.0)
Basophils Absolute: 0.1 10*3/uL (ref 0.0–0.1)
Eosinophils Absolute: 0.2 10*3/uL (ref 0.0–0.7)
Eosinophils Relative: 2.1 % (ref 0.0–5.0)
HEMATOCRIT: 39.3 % (ref 36.0–46.0)
Hemoglobin: 13.2 g/dL (ref 12.0–15.0)
LYMPHS ABS: 3.3 10*3/uL (ref 0.7–4.0)
Lymphocytes Relative: 33.4 % (ref 12.0–46.0)
MCHC: 33.7 g/dL (ref 30.0–36.0)
MCV: 87.3 fl (ref 78.0–100.0)
MONO ABS: 1 10*3/uL (ref 0.1–1.0)
Monocytes Relative: 10.4 % (ref 3.0–12.0)
Neutro Abs: 5.3 10*3/uL (ref 1.4–7.7)
Neutrophils Relative %: 53.6 % (ref 43.0–77.0)
Platelets: 272 10*3/uL (ref 150.0–400.0)
RBC: 4.5 Mil/uL (ref 3.87–5.11)
RDW: 12.9 % (ref 11.5–14.6)
WBC: 10 10*3/uL (ref 4.5–10.5)

## 2013-07-22 LAB — BRAIN NATRIURETIC PEPTIDE: Pro B Natriuretic peptide (BNP): 87 pg/mL (ref 0.0–100.0)

## 2013-07-22 MED ORDER — ESOMEPRAZOLE MAGNESIUM 40 MG PO CPDR: 40.0000 mg | DELAYED_RELEASE_CAPSULE | Freq: Two times a day (BID) | ORAL | Status: DC

## 2013-07-22 MED ORDER — FUROSEMIDE 20 MG PO TABS: 40.0000 mg | ORAL_TABLET | Freq: Every day | ORAL | Status: DC

## 2013-07-22 MED ORDER — SUCRALFATE 1 G PO TABS: 1.0000 g | ORAL_TABLET | Freq: Three times a day (TID) | ORAL | Status: DC

## 2013-07-22 NOTE — Patient Instructions (Addendum)
Your physician recommends that you schedule a follow-up appointment on Monday with [name]   Your physician recommends that you return for lab work today: BMP/BNP/CBC  A chest x-ray takes a picture of the organs and structures inside the chest, including the heart, lungs, and blood vessels. This test can show several things, including, whether the heart is enlarges; whether fluid is building up in the lungs; and whether pacemaker / defibrillator leads are still in place.  Your physician has recommended you make the following change in your medication:  1) Increase Nexium to twice daily 2) Start Carafate 4 times daily one before each meal and at bedtime 3) Start Furosemide 40mg  daily for 3 days

## 2013-07-22 NOTE — Progress Notes (Signed)
PCP: Alonza Bogus, MD Primary Cardiologist:  Dr Elige Ko Erika Lee is a 68 y.o. female who presents today for electrophysiology followup.  She underwent afib ablation 07/16/13.  She has had persistent cough since that time which chest discomfort associated with cough.  Her cough is nonproductive.  She denies hemoptysis.  She denies fevers or chills. She has mild SOB.  She has also had some pain when swallowing and eating.  Her symptoms have been persistent since her procedure. Today, she denies symptoms of palpitations, exertional chest pain,  lower extremity edema, dizziness, presyncope, or syncope.  The patient is otherwise without complaint today.   Past Medical History  Diagnosis Date  . Hyperlipidemia   . Multinodular goiter   . Paroxysmal atrial fibrillation     s/p PVI by [name] 07/16/2013  . GERD (gastroesophageal reflux disease)   . Essential hypertension, benign   . Palpitations   . Diabetes mellitus     Borderline  . Arthritis   . Atrial flutter     s/p CTI by [name] 07/16/2013   Past Surgical History  Procedure Laterality Date  . Biopsy thyroid    . Tonsillectomy    . Colonoscopy  09/10/2011    Procedure: COLONOSCOPY;  Surgeon: Jamesetta So, MD;  Location: [facility];  Service: Gastroenterology;  Laterality: N/A;  . Lesion removal N/A 05/03/2013    Procedure: EXCISION CYST, BACK;  Surgeon: Jamesetta So, MD;  Location: [facility];  Service: General;  Laterality: N/A;  . Tee without cardioversion N/A 07/15/2013    Procedure: TRANSESOPHAGEAL ECHOCARDIOGRAM (TEE);  Surgeon: Lelon Perla, MD;  Location[facility] ENDOSCOPY;  Service: Cardiovascular;  Laterality: N/A;  . Ablation  07/16/2013    PVI and CTI ablation by [name]    Current Outpatient Prescriptions  Medication Sig Dispense Refill  . apixaban (ELIQUIS) 5 MG TABS tablet Take 1 tablet (5 mg total) by mouth 2 (two) times daily.  60 tablet  12  . esomeprazole (NEXIUM) 40 MG capsule Take 1 capsule (40 mg  total) by mouth 2 (two) times daily.  60 capsule  3  . hydrochlorothiazide 25 MG tablet Take 25 mg by mouth daily.        Marland Kitchen lisinopril (PRINIVIL,ZESTRIL) 10 MG tablet Take 10 mg by mouth daily.        Marland Kitchen loratadine (CLARITIN) 10 MG tablet Take 10 mg by mouth daily as needed for allergies.      . metFORMIN (GLUCOPHAGE) 500 MG tablet Take 1 tablet (500 mg total) by mouth daily.      . metoprolol succinate (TOPROL XL) 25 MG 24 hr tablet Take 0.5 tablets (12.5 mg total) by mouth daily.  60 tablet  3  . Omega-3 Fatty Acids (FISH OIL) 1000 MG CAPS Take 1,000 mg by mouth daily.       . Potassium Gluconate 550 MG TABS Take 1 tablet (550 mg total) by mouth 2 (two) times daily.  30 each  0  . simvastatin (ZOCOR) 40 MG tablet Take 40 mg by mouth at bedtime.        . thiamine (VITAMIN B-1) 100 MG tablet Take 100 mg by mouth daily.      . furosemide (LASIX) 20 MG tablet Take 2 tablets (40 mg total) by mouth daily.  30 tablet  3  . sucralfate (CARAFATE) 1 G tablet Take 1 tablet (1 g total) by mouth 4 (four) times daily -  with meals and at bedtime.  120 tablet  3   No current facility-administered medications for this visit.    Physical Exam: Filed Vitals:   07/22/13 1228  BP: 118/74  Pulse: 73  Height: 5\' 5"  (1.651 m)  Weight: 175 lb (79.379 kg)    GEN- The patient is not ill appearing, alert and oriented x 3 today.   Head- normocephalic, atraumatic Eyes-  Sclera clear, conjunctiva pink Ears- hearing intact Oropharynx- clear Lungs- frew basilar rales, normal work of breathing Heart- Regular rate and rhythm, no murmurs, rubs or gallops, PMI not laterally displaced GI- soft, NT, ND, + BS Extremities- no clubbing, cyanosis, or edema  Assessment and Plan:   1. Cough/ sob post ablation Could be aspiration though exam is not suggestive of this.  She is mildly volume overloaded.  I will therefore gently diurese of the next 3 days and then reassess. CBC, bmet and bnp and CXR are ordered today  2.  Odynophagia Concerning, however this is a bit early for esophageal injury with ablation.  She does not appear toxically ill on exam. Possibly related to intubation. I will increase nexium to BID and add carafate tidac/hs Cbc/ bmet today  Return to reassessment on Monday. She should go to the ER if symptoms worsen over the weekend.  She may need CT to evaluate for esophageal injury if symptoms worsen.

## 2013-07-24 ENCOUNTER — Other Ambulatory Visit: Payer: Self-pay

## 2013-07-24 ENCOUNTER — Emergency Department (HOSPITAL_COMMUNITY): Payer: Medicare Other

## 2013-07-24 ENCOUNTER — Encounter (HOSPITAL_COMMUNITY): Payer: Self-pay | Admitting: Emergency Medicine

## 2013-07-24 ENCOUNTER — Emergency Department (HOSPITAL_COMMUNITY)
Admission: EM | Admit: 2013-07-24 | Discharge: 2013-07-24 | Disposition: A | Discharge status: Home / Self Care | Payer: Medicare Other | Attending: Emergency Medicine | Admitting: Emergency Medicine

## 2013-07-24 DIAGNOSIS — I4891 Unspecified atrial fibrillation: Secondary | ICD-10-CM | POA: Insufficient documentation

## 2013-07-24 DIAGNOSIS — Z95818 Presence of other cardiac implants and grafts: Secondary | ICD-10-CM | POA: Insufficient documentation

## 2013-07-24 DIAGNOSIS — Z8739 Personal history of other diseases of the musculoskeletal system and connective tissue: Secondary | ICD-10-CM | POA: Insufficient documentation

## 2013-07-24 DIAGNOSIS — Z91040 Latex allergy status: Secondary | ICD-10-CM | POA: Insufficient documentation

## 2013-07-24 DIAGNOSIS — Z7902 Long term (current) use of antithrombotics/antiplatelets: Secondary | ICD-10-CM | POA: Insufficient documentation

## 2013-07-24 DIAGNOSIS — Z79899 Other long term (current) drug therapy: Secondary | ICD-10-CM | POA: Insufficient documentation

## 2013-07-24 DIAGNOSIS — Z87891 Personal history of nicotine dependence: Secondary | ICD-10-CM | POA: Insufficient documentation

## 2013-07-24 DIAGNOSIS — Z9889 Other specified postprocedural states: Secondary | ICD-10-CM | POA: Insufficient documentation

## 2013-07-24 DIAGNOSIS — R5383 Other fatigue: Secondary | ICD-10-CM

## 2013-07-24 DIAGNOSIS — I10 Essential (primary) hypertension: Secondary | ICD-10-CM | POA: Insufficient documentation

## 2013-07-24 DIAGNOSIS — E860 Dehydration: Secondary | ICD-10-CM

## 2013-07-24 DIAGNOSIS — IMO0001 Reserved for inherently not codable concepts without codable children: Secondary | ICD-10-CM | POA: Insufficient documentation

## 2013-07-24 DIAGNOSIS — I4892 Unspecified atrial flutter: Secondary | ICD-10-CM | POA: Insufficient documentation

## 2013-07-24 DIAGNOSIS — R05 Cough: Secondary | ICD-10-CM | POA: Insufficient documentation

## 2013-07-24 DIAGNOSIS — R5381 Other malaise: Secondary | ICD-10-CM | POA: Insufficient documentation

## 2013-07-24 DIAGNOSIS — R059 Cough, unspecified: Secondary | ICD-10-CM | POA: Insufficient documentation

## 2013-07-24 DIAGNOSIS — R0602 Shortness of breath: Secondary | ICD-10-CM | POA: Insufficient documentation

## 2013-07-24 DIAGNOSIS — E785 Hyperlipidemia, unspecified: Secondary | ICD-10-CM | POA: Insufficient documentation

## 2013-07-24 DIAGNOSIS — K219 Gastro-esophageal reflux disease without esophagitis: Secondary | ICD-10-CM | POA: Insufficient documentation

## 2013-07-24 LAB — CBC WITH DIFFERENTIAL/PLATELET
BASOS ABS: 0 10*3/uL (ref 0.0–0.1)
BASOS PCT: 0 % (ref 0–1)
Eosinophils Absolute: 0.2 10*3/uL (ref 0.0–0.7)
Eosinophils Relative: 3 % (ref 0–5)
HEMATOCRIT: 41.1 % (ref 36.0–46.0)
Hemoglobin: 14.6 g/dL (ref 12.0–15.0)
LYMPHS PCT: 33 % (ref 12–46)
Lymphs Abs: 2.6 10*3/uL (ref 0.7–4.0)
MCH: 29.7 pg (ref 26.0–34.0)
MCHC: 35.5 g/dL (ref 30.0–36.0)
MCV: 83.5 fL (ref 78.0–100.0)
MONO ABS: 0.9 10*3/uL (ref 0.1–1.0)
MONOS PCT: 11 % (ref 3–12)
NEUTROS ABS: 4.2 10*3/uL (ref 1.7–7.7)
Neutrophils Relative %: 53 % (ref 43–77)
Platelets: 250 10*3/uL (ref 150–400)
RBC: 4.92 MIL/uL (ref 3.87–5.11)
RDW: 11.9 % (ref 11.5–15.5)
WBC: 8 10*3/uL (ref 4.0–10.5)

## 2013-07-24 LAB — PRO B NATRIURETIC PEPTIDE: PRO B NATRI PEPTIDE: 62 pg/mL (ref 0–125)

## 2013-07-24 LAB — HEPATIC FUNCTION PANEL
ALBUMIN: 3.9 g/dL (ref 3.5–5.2)
ALT: 13 U/L (ref 0–35)
AST: 13 U/L (ref 0–37)
Alkaline Phosphatase: 82 U/L (ref 39–117)
Bilirubin, Direct: 0.2 mg/dL (ref 0.0–0.3)
Indirect Bilirubin: 0.1 mg/dL — ABNORMAL LOW (ref 0.3–0.9)
TOTAL PROTEIN: 7.4 g/dL (ref 6.0–8.3)
Total Bilirubin: 0.3 mg/dL (ref 0.3–1.2)

## 2013-07-24 LAB — BASIC METABOLIC PANEL
BUN: 11 mg/dL (ref 6–23)
CHLORIDE: 88 meq/L — AB (ref 96–112)
CO2: 23 mEq/L (ref 19–32)
Calcium: 10.1 mg/dL (ref 8.4–10.5)
Creatinine, Ser: 0.76 mg/dL (ref 0.50–1.10)
GFR calc non Af Amer: 84 mL/min — ABNORMAL LOW (ref >90)
Glucose, Bld: 176 mg/dL — ABNORMAL HIGH (ref 70–99)
POTASSIUM: 3.7 meq/L (ref 3.7–5.3)
Sodium: 129 mEq/L — ABNORMAL LOW (ref 137–147)

## 2013-07-24 LAB — TROPONIN I

## 2013-07-24 MED ORDER — SODIUM CHLORIDE 0.9 % IV BOLUS (SEPSIS)
1000.0000 mL | Freq: Once | INTRAVENOUS | Status: AC
  Administered 2013-07-24: 1000 mL via INTRAVENOUS

## 2013-07-24 NOTE — ED Notes (Signed)
Patient given discharge instruction, verbalized understand. IV removed, band aid applied. Patient ambulatory out of the department.  

## 2013-07-24 NOTE — ED Notes (Addendum)
Pt now states that she has had a headache for the past two days that has been intermittent and having nausea.

## 2013-07-24 NOTE — ED Notes (Signed)
Pt ambulatory to restroom, tolerated well,  

## 2013-07-24 NOTE — ED Provider Notes (Signed)
CSN: [id number]     Arrival date & time 07/24/13  1658 History   First MD Initiated Contact with Patient 07/24/13 1740     This chart was scribed for Erika Diego, MD by Forrestine Him, ED Scribe. This patient was seen in room APA17/APA17 and the patient's care was started 5:48 PM.   Chief Complaint  Patient presents with  . Dizziness  . Weakness   Patient is a 68 y.o. female presenting with dizziness and weakness. The history is provided by the patient. No language interpreter was used.  Dizziness Quality:  Lightheadedness Severity:  Moderate Onset quality:  Gradual Duration:  8 hours Timing:  Constant Progression:  Unchanged Chronicity:  New Relieved by:  Nothing Worsened by:  Nothing tried Ineffective treatments:  None tried Associated symptoms: shortness of breath   Shortness of breath:    Severity:  Mild   Onset quality:  Gradual   Duration:  8 hours   Timing:  Intermittent   Progression:  Unchanged Weakness Associated symptoms include shortness of breath.    HPI Comments: Erika Lee is a 68 y.o. female who presents to the Emergency Department complaining of gradual onset, unchanged, constant, moderate generalized weakness with associated dizziness and lightheadedness that initially started today after waking up this morning from sleep. She also reports SOB she says is brought on by her dizziness, and a productive cough consisting of yellow and clear sputum onset 1/17. Pt states she was advised to not try any cough medication, and to cough up the mucous instead. Pt denies any alleviating or aggravating factors at this time. She denies any fever or chills. She reports recently having a cardiac catheterization on 1/16 performed by Dr. Thompson Grayer of [place]. Pt states Dr. Rayann Heman recently increased her dosage of Lasix to 20 mg 2 times daily. Pt has a PMHx of hyperlipidemia, multinodular goiter, A-Fib, GERD, HTN, borderline DM, and atrial flutter.  Past Medical History   Diagnosis Date  . Hyperlipidemia   . Multinodular goiter   . Paroxysmal atrial fibrillation     s/p PVI by [name] 07/16/2013  . GERD (gastroesophageal reflux disease)   . Essential hypertension, benign   . Palpitations   . Diabetes mellitus     Borderline  . Arthritis   . Atrial flutter     s/p CTI by [name] 07/16/2013   Past Surgical History  Procedure Laterality Date  . Biopsy thyroid    . Tonsillectomy    . Colonoscopy  09/10/2011    Procedure: COLONOSCOPY;  Surgeon: Jamesetta So, MD;  Location: [facility];  Service: Gastroenterology;  Laterality: N/A;  . Lesion removal N/A 05/03/2013    Procedure: EXCISION CYST, BACK;  Surgeon: Jamesetta So, MD;  Location: [facility];  Service: General;  Laterality: N/A;  . Tee without cardioversion N/A 07/15/2013    Procedure: TRANSESOPHAGEAL ECHOCARDIOGRAM (TEE);  Surgeon: Lelon Perla, MD;  Location: [facility] ENDOSCOPY;  Service: Cardiovascular;  Laterality: N/A;  . Ablation  07/16/2013    PVI and CTI ablation by [name]   Family History  Problem Relation Age of Onset  . Stroke Mother    History  Substance Use Topics  . Smoking status: Former Smoker    Types: Cigarettes    Quit date: 11/29/1993  . Smokeless tobacco: Never Used  . Alcohol Use: No   OB History   Grav Para Term Preterm Abortions TAB SAB Ect Mult Living  Review of Systems  Constitutional: Negative for chills and fatigue.  Respiratory: Positive for shortness of breath.   Musculoskeletal: Positive for myalgias.  Neurological: Positive for dizziness, weakness and light-headedness.  All other systems reviewed and are negative.    Allergies  Clindamycin/lincomycin; Doxycycline; Adhesive; Latex; and Sulfonamide derivatives  Home Medications   Current Outpatient Rx  Name  Route  Sig  Dispense  Refill  . apixaban (ELIQUIS) 5 MG TABS tablet   Oral   Take 1 tablet (5 mg total) by mouth 2 (two) times daily.   60 tablet   12   .  esomeprazole (NEXIUM) 40 MG capsule   Oral   Take 1 capsule (40 mg total) by mouth 2 (two) times daily.   60 capsule   3   . furosemide (LASIX) 20 MG tablet   Oral   Take 20 mg by mouth 2 (two) times daily.         . hydrochlorothiazide 25 MG tablet   Oral   Take 25 mg by mouth daily.           Marland Kitchen lisinopril (PRINIVIL,ZESTRIL) 10 MG tablet   Oral   Take 10 mg by mouth daily.           Marland Kitchen loratadine (CLARITIN) 10 MG tablet   Oral   Take 10 mg by mouth daily as needed for allergies.         . metFORMIN (GLUCOPHAGE) 500 MG tablet   Oral   Take 1 tablet (500 mg total) by mouth daily.           Restart this medication on Monday, Jul 19, 2013.   . metoprolol succinate (TOPROL XL) 25 MG 24 hr tablet   Oral   Take 0.5 tablets (12.5 mg total) by mouth daily.   60 tablet   3   . Omega-3 Fatty Acids (FISH OIL) 1000 MG CAPS   Oral   Take 1,000 mg by mouth daily.          . Potassium Gluconate 550 MG TABS   Oral   Take 1 tablet (550 mg total) by mouth 2 (two) times daily.   30 each   0   . simvastatin (ZOCOR) 40 MG tablet   Oral   Take 40 mg by mouth at bedtime.           . sucralfate (CARAFATE) 1 G tablet   Oral   Take 1 tablet (1 g total) by mouth 4 (four) times daily -  with meals and at bedtime.   120 tablet   3   . thiamine (VITAMIN B-1) 100 MG tablet   Oral   Take 100 mg by mouth daily.          Triage Vitals: BP 121/75  Pulse 66  Temp(Src) 97.7 F (36.5 C) (Oral)  Resp 20  Ht 5\' 5"  (1.651 m)  Wt 170 lb (77.111 kg)  BMI 28.29 kg/m2  SpO2 96%  Physical Exam  Nursing note and vitals reviewed. Constitutional: She is oriented to person, place, and time. She appears well-developed and well-nourished. No distress.  HENT:  Head: Normocephalic and atraumatic.  Eyes: EOM are normal.  Neck: Normal range of motion.  Cardiovascular: Normal rate, regular rhythm and normal heart sounds.   Pulmonary/Chest: Effort normal. She has wheezes.  Mild  wheezes bilaterally  Abdominal: Soft. She exhibits no distension. There is no tenderness.  Musculoskeletal: Normal range of motion.  Neurological: She is alert and oriented  to person, place, and time.  Skin: Skin is warm and dry.  Psychiatric: She has a normal mood and affect. Judgment normal.    ED Course  Procedures (including critical care time)  DIAGNOSTIC STUDIES: Oxygen Saturation is 96% on RA, adequate by my interpretation.    COORDINATION OF CARE: 5:40 PM- Will order chest X-Ray, blood panel, hepatic function panel, and EKG. Discussed treatment plan with pt at bedside and pt agreed to plan.     9:15 PM-Pt states she still feels some what lightheaded. Discussed test results with pt at bedside. Will give pt fluids.  Labs Review Labs Reviewed  BASIC METABOLIC PANEL - Abnormal; Notable for the following:    Sodium 129 (*)    Chloride 88 (*)    Glucose, Bld 176 (*)    GFR calc non Af Amer 84 (*)    All other components within normal limits  HEPATIC FUNCTION PANEL - Abnormal; Notable for the following:    Indirect Bilirubin 0.1 (*)    All other components within normal limits  CBC WITH DIFFERENTIAL  TROPONIN I  PRO B NATRIURETIC PEPTIDE   Imaging Review Dg Chest 2 View  07/24/2013   CLINICAL DATA:  Dizziness and weakness.  EXAM: CHEST  2 VIEW  COMPARISON:  07/22/2013  FINDINGS: The heart size and mediastinal contours are within normal limits. Both lungs are clear. The visualized skeletal structures are unremarkable.  IMPRESSION: Stable exam.  No active cardiopulmonary disease.   Electronically Signed   By: Earle Gell M.D.   On: 07/24/2013 18:10    EKG Interpretation    Date/Time:  Saturday July 24 2013 17:08:34 EST Ventricular Rate:  67 PR Interval:  160 QRS Duration: 100 QT Interval:  398 QTC Calculation: 420 R Axis:   73 Text Interpretation:  Normal sinus rhythm Normal ECG When compared with ECG of 17-Jul-2013 10:29, Nonspecific T wave abnormality no longer  evident in Inferior leads Confirmed by   MD,  ([id number]) on 07/24/2013 11:14:51 PM            MDM  dehydration  Erika Diego, MD 07/24/13 2315

## 2013-07-24 NOTE — ED Notes (Signed)
Md at the bedside

## 2013-07-24 NOTE — ED Notes (Signed)
Patient ambulatory to restroom with steady gait, husband with pt.

## 2013-07-24 NOTE — Discharge Instructions (Signed)
Follow up with your md Monday as planned.  Rest at home tomorrow

## 2013-07-24 NOTE — ED Notes (Signed)
Pt HR dropping to 39 on monitor, MD called to bedside, orders given. Repeat EKG

## 2013-07-24 NOTE — ED Notes (Signed)
Pt c/o generalized  weakness, light headed after getting up this am, denies any n/v, denies any pain, admits to cough that is  productive since recently having cardiac ablation performed 07/16/2012, was seen in cardiology office on 07/22/2013, had xray and was told that she had fluid in her chest.

## 2013-07-26 ENCOUNTER — Ambulatory Visit (INDEPENDENT_AMBULATORY_CARE_PROVIDER_SITE_OTHER)
Admission: RE | Admit: 2013-07-26 | Discharge: 2013-07-26 | Disposition: A | Discharge status: Home / Self Care | Payer: Medicare Other | Source: Ambulatory Visit | Attending: Internal Medicine | Admitting: Internal Medicine

## 2013-07-26 ENCOUNTER — Encounter (HOSPITAL_COMMUNITY): Payer: Self-pay | Admitting: Anesthesiology

## 2013-07-26 ENCOUNTER — Ambulatory Visit (INDEPENDENT_AMBULATORY_CARE_PROVIDER_SITE_OTHER): Payer: Medicare Other | Admitting: Internal Medicine

## 2013-07-26 ENCOUNTER — Encounter: Payer: Self-pay | Admitting: Internal Medicine

## 2013-07-26 VITALS — BP 130/78 | HR 87 | Ht 65.0 in | Wt 171.8 lb

## 2013-07-26 DIAGNOSIS — R059 Cough, unspecified: Secondary | ICD-10-CM

## 2013-07-26 DIAGNOSIS — R05 Cough: Secondary | ICD-10-CM

## 2013-07-26 MED ORDER — GUAIFENESIN-CODEINE 100-10 MG/5ML PO SYRP: 5.0000 mL | ORAL_SOLUTION | Freq: Three times a day (TID) | ORAL | Status: DC | PRN

## 2013-07-26 NOTE — Patient Instructions (Addendum)
Your physician recommends that you schedule a follow-up appointment on 08/02/13 at 12:30 with [name]   Your physician has requested that you have cardiac CT. Cardiac computed tomography (CT) is a painless test that uses an x-ray machine to take clear, detailed pictures of your heart. For further information please visit [facility]. Please follow instruction sheet as given.  Take Robitussin AC as needed for cough

## 2013-07-26 NOTE — Addendum Note (Signed)
Addendum created 07/26/13 1003 by Finis Bud, MD   Modules edited: Anesthesia Attestations

## 2013-07-28 ENCOUNTER — Inpatient Hospital Stay (HOSPITAL_COMMUNITY)
Admission: EM | Admit: 2013-07-28 | Discharge: 2013-08-03 | DRG: 641 | Disposition: A | Discharge status: Home / Self Care | Payer: Medicare Other | Attending: Pulmonary Disease | Admitting: Pulmonary Disease

## 2013-07-28 ENCOUNTER — Emergency Department (HOSPITAL_COMMUNITY): Payer: Medicare Other

## 2013-07-28 ENCOUNTER — Encounter (HOSPITAL_COMMUNITY): Payer: Self-pay | Admitting: Emergency Medicine

## 2013-07-28 DIAGNOSIS — R059 Cough, unspecified: Secondary | ICD-10-CM | POA: Diagnosis present

## 2013-07-28 DIAGNOSIS — M129 Arthropathy, unspecified: Secondary | ICD-10-CM | POA: Diagnosis present

## 2013-07-28 DIAGNOSIS — E878 Other disorders of electrolyte and fluid balance, not elsewhere classified: Secondary | ICD-10-CM | POA: Diagnosis present

## 2013-07-28 DIAGNOSIS — E871 Hypo-osmolality and hyponatremia: Principal | ICD-10-CM | POA: Diagnosis present

## 2013-07-28 DIAGNOSIS — E785 Hyperlipidemia, unspecified: Secondary | ICD-10-CM | POA: Diagnosis present

## 2013-07-28 DIAGNOSIS — I480 Paroxysmal atrial fibrillation: Secondary | ICD-10-CM | POA: Diagnosis present

## 2013-07-28 DIAGNOSIS — I4892 Unspecified atrial flutter: Secondary | ICD-10-CM

## 2013-07-28 DIAGNOSIS — E119 Type 2 diabetes mellitus without complications: Secondary | ICD-10-CM | POA: Diagnosis present

## 2013-07-28 DIAGNOSIS — I498 Other specified cardiac arrhythmias: Secondary | ICD-10-CM | POA: Diagnosis present

## 2013-07-28 DIAGNOSIS — Z79899 Other long term (current) drug therapy: Secondary | ICD-10-CM

## 2013-07-28 DIAGNOSIS — E042 Nontoxic multinodular goiter: Secondary | ICD-10-CM | POA: Diagnosis present

## 2013-07-28 DIAGNOSIS — R05 Cough: Secondary | ICD-10-CM | POA: Diagnosis present

## 2013-07-28 DIAGNOSIS — J09X2 Influenza due to identified novel influenza A virus with other respiratory manifestations: Secondary | ICD-10-CM | POA: Diagnosis present

## 2013-07-28 DIAGNOSIS — R531 Weakness: Secondary | ICD-10-CM | POA: Diagnosis present

## 2013-07-28 DIAGNOSIS — Z823 Family history of stroke: Secondary | ICD-10-CM

## 2013-07-28 DIAGNOSIS — I10 Essential (primary) hypertension: Secondary | ICD-10-CM | POA: Diagnosis present

## 2013-07-28 DIAGNOSIS — K219 Gastro-esophageal reflux disease without esophagitis: Secondary | ICD-10-CM | POA: Diagnosis present

## 2013-07-28 DIAGNOSIS — I4891 Unspecified atrial fibrillation: Secondary | ICD-10-CM | POA: Diagnosis present

## 2013-07-28 DIAGNOSIS — Z87891 Personal history of nicotine dependence: Secondary | ICD-10-CM

## 2013-07-28 DIAGNOSIS — Z7901 Long term (current) use of anticoagulants: Secondary | ICD-10-CM

## 2013-07-28 LAB — COMPREHENSIVE METABOLIC PANEL
ALT: 20 U/L (ref 0–35)
AST: 28 U/L (ref 0–37)
Albumin: 3.4 g/dL — ABNORMAL LOW (ref 3.5–5.2)
Alkaline Phosphatase: 64 U/L (ref 39–117)
BUN: 10 mg/dL (ref 6–23)
CHLORIDE: 83 meq/L — AB (ref 96–112)
CO2: 24 meq/L (ref 19–32)
CREATININE: 0.8 mg/dL (ref 0.50–1.10)
Calcium: 9.2 mg/dL (ref 8.4–10.5)
GFR calc Af Amer: 85 mL/min — ABNORMAL LOW (ref >90)
GFR, EST NON AFRICAN AMERICAN: 74 mL/min — AB (ref >90)
GLUCOSE: 165 mg/dL — AB (ref 70–99)
POTASSIUM: 3.7 meq/L (ref 3.7–5.3)
Sodium: 122 mEq/L — ABNORMAL LOW (ref 137–147)
Total Bilirubin: 0.2 mg/dL — ABNORMAL LOW (ref 0.3–1.2)
Total Protein: 6.8 g/dL (ref 6.0–8.3)

## 2013-07-28 LAB — CBC
HEMATOCRIT: 37.6 % (ref 36.0–46.0)
HEMOGLOBIN: 13.7 g/dL (ref 12.0–15.0)
MCH: 29.8 pg (ref 26.0–34.0)
MCHC: 36.4 g/dL — ABNORMAL HIGH (ref 30.0–36.0)
MCV: 81.7 fL (ref 78.0–100.0)
Platelets: 200 10*3/uL (ref 150–400)
RBC: 4.6 MIL/uL (ref 3.87–5.11)
RDW: 11.9 % (ref 11.5–15.5)
WBC: 4.4 10*3/uL (ref 4.0–10.5)

## 2013-07-28 LAB — POCT I-STAT TROPONIN I: TROPONIN I, POC: 0.01 ng/mL (ref 0.00–0.08)

## 2013-07-28 LAB — PRO B NATRIURETIC PEPTIDE: PRO B NATRI PEPTIDE: 134.5 pg/mL — AB (ref 0–125)

## 2013-07-28 MED ORDER — ASPIRIN 81 MG PO CHEW
324.0000 mg | CHEWABLE_TABLET | Freq: Once | ORAL | Status: AC
  Administered 2013-07-28: 324 mg via ORAL
  Filled 2013-07-28: qty 4

## 2013-07-28 MED ORDER — SODIUM CHLORIDE 0.9 % IV SOLN
1000.0000 mL | INTRAVENOUS | Status: DC
  Administered 2013-07-28: 1000 mL via INTRAVENOUS

## 2013-07-28 MED ORDER — SODIUM CHLORIDE 0.9 % IV SOLN
1000.0000 mL | Freq: Once | INTRAVENOUS | Status: AC
  Administered 2013-07-28: 1000 mL via INTRAVENOUS

## 2013-07-28 NOTE — ED Notes (Addendum)
PT C/O SOB X 2 WKS, COUGH,  HEADACHE, AND FEELS WEAK.

## 2013-07-28 NOTE — ED Provider Notes (Signed)
CSN: [id number]     Arrival date & time 07/28/13  2023 History  This chart was scribed for Carmin Muskrat, MD by Zettie Pho, ED Scribe. This patient was seen in room APA10/APA10 and the patient's care was started at 9:44 PM.    Chief Complaint  Patient presents with  . Shortness of Breath   The history is provided by the patient. No language interpreter was used.   HPI Comments: PEARSON MEDARIS is a 68 y.o. female with a history of HTN, palpitations, atrial flutter, paroxysmal atrial fibrillation who presents to the Emergency Department complaining of an intermittent, dry cough with associated shortness of breath, diffuse headache, diffuse weakness, lightheadedness, and posterior neck pain onset about 2 weeks ago that has been progressively worsening. Patient states that she had a cardiac ablation on 07/16/2013 for her atrial fibrillation and that her current symptoms presented after that. She states that she followed up with her cardiologist for the cough and was given Robitussin, but she states the cough has been persistent. She denies fever, emesis, diarrhea, abdominal pain. She denies history of MI, blood clots, or stroke. Patient also has a history of multinodular goiter, GERD, DM, and arthritis.   Past Medical History  Diagnosis Date  . Hyperlipidemia   . Multinodular goiter   . Paroxysmal atrial fibrillation     s/p PVI by [name] 07/16/2013  . GERD (gastroesophageal reflux disease)   . Essential hypertension, benign   . Palpitations   . Diabetes mellitus     Borderline  . Arthritis   . Atrial flutter     s/p CTI by [name] 07/16/2013   Past Surgical History  Procedure Laterality Date  . Biopsy thyroid    . Tonsillectomy    . Colonoscopy  09/10/2011    Procedure: COLONOSCOPY;  Surgeon: Jamesetta So, MD;  Location: [facility];  Service: Gastroenterology;  Laterality: N/A;  . Lesion removal N/A 05/03/2013    Procedure: EXCISION CYST, BACK;  Surgeon: Jamesetta So, MD;   Location: [facility];  Service: General;  Laterality: N/A;  . Tee without cardioversion N/A 07/15/2013    Procedure: TRANSESOPHAGEAL ECHOCARDIOGRAM (TEE);  Surgeon: Lelon Perla, MD;  Location: [facility] ENDOSCOPY;  Service: Cardiovascular;  Laterality: N/A;  . Ablation  07/16/2013    PVI and CTI ablation by [name]   Family History  Problem Relation Age of Onset  . Stroke Mother    History  Substance Use Topics  . Smoking status: Former Smoker    Types: Cigarettes    Quit date: 11/29/1993  . Smokeless tobacco: Never Used  . Alcohol Use: No   OB History   Grav Para Term Preterm Abortions TAB SAB Ect Mult Living                 Review of Systems  Constitutional: Negative for fever.       Per HPI, otherwise negative  HENT:       Per HPI, otherwise negative  Respiratory: Positive for cough and shortness of breath.        Per HPI, otherwise negative  Cardiovascular:       Per HPI, otherwise negative  Gastrointestinal: Negative for vomiting, abdominal pain and diarrhea.  Endocrine:       Negative aside from HPI  Genitourinary:       Neg aside from HPI   Musculoskeletal: Positive for neck pain.       Per HPI, otherwise negative  Skin: Negative.  Neurological: Positive for weakness, light-headedness and headaches. Negative for syncope.    Allergies  Clindamycin/lincomycin; Doxycycline; Adhesive; Latex; and Sulfonamide derivatives  Home Medications   Current Outpatient Rx  Name  Route  Sig  Dispense  Refill  . apixaban (ELIQUIS) 5 MG TABS tablet   Oral   Take 1 tablet (5 mg total) by mouth 2 (two) times daily.   60 tablet   12   . esomeprazole (NEXIUM) 40 MG capsule   Oral   Take 1 capsule (40 mg total) by mouth 2 (two) times daily.   60 capsule   3   . furosemide (LASIX) 20 MG tablet   Oral   Take 20 mg by mouth 2 (two) times daily.         Marland Kitchen guaiFENesin-codeine (ROBITUSSIN AC) 100-10 MG/5ML syrup   Oral   Take 5 mLs by mouth 3 (three) times daily as needed  for cough.   120 mL   0   . hydrochlorothiazide 25 MG tablet   Oral   Take 25 mg by mouth daily.           Marland Kitchen lisinopril (PRINIVIL,ZESTRIL) 10 MG tablet   Oral   Take 10 mg by mouth daily.           Marland Kitchen loratadine (CLARITIN) 10 MG tablet   Oral   Take 10 mg by mouth daily as needed for allergies.         . metFORMIN (GLUCOPHAGE) 500 MG tablet   Oral   Take 1 tablet (500 mg total) by mouth daily.           Restart this medication on Monday, Jul 19, 2013.   . metoprolol succinate (TOPROL XL) 25 MG 24 hr tablet   Oral   Take 0.5 tablets (12.5 mg total) by mouth daily.   60 tablet   3   . Omega-3 Fatty Acids (FISH OIL) 1000 MG CAPS   Oral   Take 1,000 mg by mouth daily.          . Potassium Gluconate 550 MG TABS   Oral   Take 1 tablet (550 mg total) by mouth 2 (two) times daily.   30 each   0   . simvastatin (ZOCOR) 40 MG tablet   Oral   Take 40 mg by mouth at bedtime.           . thiamine (VITAMIN B-1) 100 MG tablet   Oral   Take 100 mg by mouth daily.          Triage Vitals: 103/67  Pulse 62  Temp(Src) 98.5 F (36.9 C) (Oral)  Resp 20  Ht 5\' 5"  (1.651 m)  Wt 170 lb (77.111 kg)  BMI 28.29 kg/m2  SpO2 99%  Physical Exam  Nursing note and vitals reviewed. Constitutional: She is oriented to person, place, and time. She appears well-developed and well-nourished. No distress.  HENT:  Head: Normocephalic and atraumatic.  Eyes: Conjunctivae and EOM are normal.  Cardiovascular: Normal rate and regular rhythm.   Pulmonary/Chest: Effort normal and breath sounds normal. No stridor. No respiratory distress.  Abdominal: She exhibits no distension.  Musculoskeletal: She exhibits no edema.  Neurological: She is alert and oriented to person, place, and time. No cranial nerve deficit.  Skin: Skin is warm and dry.  Psychiatric: She has a normal mood and affect.    ED Course  Procedures (including critical care time)  COORDINATION OF CARE: 9:48 PM-  Will order blood  labs, EKG, and a chest x-ray. Will order aspirin and IV fluids to manage symptoms. Discussed treatment plan with patient at bedside and patient verbalized agreement.     Labs Review Labs Reviewed - No data to display Imaging Review No results found.  EKG Interpretation    Date/Time:  Wednesday July 28 2013 22:01:17 EST Ventricular Rate:  55 PR Interval:  160 QRS Duration: 100 QT Interval:  440 QTC Calculation: 420 R Axis:   68 Text Interpretation:  Sinus bradycardia Otherwise normal ECG When compared with ECG of 24-Jul-2013 22:45, No significant change was found Sinus bradycardia marginally slower than most recent ECG, otherwise unchanged Abnormal ekg Confirmed by Carmin Muskrat  MD [phone]) on 07/28/2013 10:19:10 PM           11:54 PM Patient and husband aware of all results. With the hyponatremia, patient has already received IV fluids, will require additional resuscitation as an inpatient.  MDM   1. Hyponatremia     I personally performed the services described in this documentation, which was scribed in my presence. The recorded information has been reviewed and is accurate.   This patient presents with concern of ongoing headache, weakness, dyspnea, cough. Note the patient's multiple medical problems, including atrial fibrillation for which she recently had cardiac ablation.  Patient's evaluation here is largely reassuring in terms of cardiac findings.  However, the patient is found to have hyponatremia, with a critically low level of 122.  With this abnormality she required fluid resuscitation, admission for further evaluation and management. There is no obvious culprit for her hyponatremia, though there is some suspicion for medication effects versus dehydration. There is low suspicion for occult infection.   CRITICAL CARE Performed by: Carmin Muskrat Total critical care time: 35 Critical care time was exclusive of separately billable  procedures and treating other patients. Critical care was necessary to treat or prevent imminent or life-threatening deterioration. Critical care was time spent personally by me on the following activities: development of treatment plan with patient and/or surrogate as well as nursing, discussions with consultants, evaluation of patient's response to treatment, examination of patient, obtaining history from patient or surrogate, ordering and performing treatments and interventions, ordering and review of laboratory studies, ordering and review of radiographic studies, pulse oximetry and re-evaluation of patient's condition.    Carmin Muskrat, MD 07/28/13 2356

## 2013-07-29 ENCOUNTER — Encounter (HOSPITAL_COMMUNITY): Payer: Self-pay | Admitting: Internal Medicine

## 2013-07-29 DIAGNOSIS — E871 Hypo-osmolality and hyponatremia: Principal | ICD-10-CM

## 2013-07-29 DIAGNOSIS — R059 Cough, unspecified: Secondary | ICD-10-CM

## 2013-07-29 DIAGNOSIS — I4891 Unspecified atrial fibrillation: Secondary | ICD-10-CM

## 2013-07-29 DIAGNOSIS — R531 Weakness: Secondary | ICD-10-CM | POA: Diagnosis present

## 2013-07-29 DIAGNOSIS — I10 Essential (primary) hypertension: Secondary | ICD-10-CM

## 2013-07-29 DIAGNOSIS — R05 Cough: Secondary | ICD-10-CM

## 2013-07-29 LAB — OSMOLALITY, URINE: Osmolality, Ur: 200 mOsm/kg — ABNORMAL LOW (ref 390–1090)

## 2013-07-29 LAB — URINALYSIS, ROUTINE W REFLEX MICROSCOPIC
Bilirubin Urine: NEGATIVE
Glucose, UA: NEGATIVE mg/dL
Hgb urine dipstick: NEGATIVE
Ketones, ur: NEGATIVE mg/dL
LEUKOCYTES UA: NEGATIVE
Nitrite: NEGATIVE
PH: 6 (ref 5.0–8.0)
Protein, ur: NEGATIVE mg/dL
Specific Gravity, Urine: <1.005 — ABNORMAL LOW (ref 1.005–1.030)
Urobilinogen, UA: 0.2 mg/dL (ref 0.0–1.0)

## 2013-07-29 LAB — CBC
HCT: 33.8 % — ABNORMAL LOW (ref 36.0–46.0)
Hemoglobin: 12.2 g/dL (ref 12.0–15.0)
MCH: 29.7 pg (ref 26.0–34.0)
MCHC: 36.1 g/dL — ABNORMAL HIGH (ref 30.0–36.0)
MCV: 82.2 fL (ref 78.0–100.0)
PLATELETS: 174 10*3/uL (ref 150–400)
RBC: 4.11 MIL/uL (ref 3.87–5.11)
RDW: 11.9 % (ref 11.5–15.5)
WBC: 4.2 10*3/uL (ref 4.0–10.5)

## 2013-07-29 LAB — COMPREHENSIVE METABOLIC PANEL
ALK PHOS: 57 U/L (ref 39–117)
ALT: 17 U/L (ref 0–35)
AST: 21 U/L (ref 0–37)
Albumin: 3.1 g/dL — ABNORMAL LOW (ref 3.5–5.2)
BILIRUBIN TOTAL: 0.2 mg/dL — AB (ref 0.3–1.2)
BUN: 8 mg/dL (ref 6–23)
CO2: 26 meq/L (ref 19–32)
Calcium: 8.6 mg/dL (ref 8.4–10.5)
Chloride: 90 mEq/L — ABNORMAL LOW (ref 96–112)
Creatinine, Ser: 0.84 mg/dL (ref 0.50–1.10)
GFR calc Af Amer: 80 mL/min — ABNORMAL LOW (ref >90)
GFR calc non Af Amer: 69 mL/min — ABNORMAL LOW (ref >90)
Glucose, Bld: 152 mg/dL — ABNORMAL HIGH (ref 70–99)
POTASSIUM: 3.7 meq/L (ref 3.7–5.3)
Sodium: 128 mEq/L — ABNORMAL LOW (ref 137–147)
Total Protein: 5.9 g/dL — ABNORMAL LOW (ref 6.0–8.3)

## 2013-07-29 LAB — INFLUENZA PANEL BY PCR (TYPE A & B)
H1N1 flu by pcr: NOT DETECTED
INFLAPCR: POSITIVE — AB
Influenza B By PCR: NEGATIVE

## 2013-07-29 LAB — GLUCOSE, CAPILLARY
GLUCOSE-CAPILLARY: 168 mg/dL — AB (ref 70–99)
GLUCOSE-CAPILLARY: 104 mg/dL — AB (ref 70–99)
Glucose-Capillary: 140 mg/dL — ABNORMAL HIGH (ref 70–99)

## 2013-07-29 LAB — TROPONIN I: Troponin I: <0.3 ng/mL (ref <0.30)

## 2013-07-29 MED ORDER — ONDANSETRON HCL 4 MG PO TABS
4.0000 mg | ORAL_TABLET | Freq: Four times a day (QID) | ORAL | Status: DC | PRN
  Administered 2013-08-03: 4 mg via ORAL
  Filled 2013-07-29: qty 1

## 2013-07-29 MED ORDER — SODIUM CHLORIDE 0.9 % IV SOLN
INTRAVENOUS | Status: DC
  Administered 2013-07-29 – 2013-08-02 (×7): via INTRAVENOUS

## 2013-07-29 MED ORDER — ALBUTEROL SULFATE (2.5 MG/3ML) 0.083% IN NEBU
2.5000 mg | INHALATION_SOLUTION | Freq: Once | RESPIRATORY_TRACT | Status: AC
  Administered 2013-07-29: 2.5 mg via RESPIRATORY_TRACT
  Filled 2013-07-29: qty 3

## 2013-07-29 MED ORDER — GUAIFENESIN ER 600 MG PO TB12
1200.0000 mg | ORAL_TABLET | Freq: Two times a day (BID) | ORAL | Status: DC
  Administered 2013-07-29 – 2013-08-03 (×11): 1200 mg via ORAL
  Filled 2013-07-29 (×11): qty 2

## 2013-07-29 MED ORDER — ACETAMINOPHEN 650 MG RE SUPP: 650.0000 mg | Freq: Four times a day (QID) | RECTAL | Status: DC | PRN

## 2013-07-29 MED ORDER — DEXTROSE 5 % IV SOLN
1.0000 g | INTRAVENOUS | Status: DC
  Administered 2013-07-29 – 2013-08-02 (×5): 1 g via INTRAVENOUS
  Filled 2013-07-29 (×7): qty 10

## 2013-07-29 MED ORDER — ALBUTEROL SULFATE (2.5 MG/3ML) 0.083% IN NEBU: 2.5000 mg | INHALATION_SOLUTION | RESPIRATORY_TRACT | Status: DC | PRN

## 2013-07-29 MED ORDER — LISINOPRIL 10 MG PO TABS
10.0000 mg | ORAL_TABLET | Freq: Every day | ORAL | Status: DC
  Filled 2013-07-29: qty 1

## 2013-07-29 MED ORDER — METOPROLOL SUCCINATE ER 25 MG PO TB24
12.5000 mg | ORAL_TABLET | Freq: Every day | ORAL | Status: DC
  Administered 2013-08-02: 12.5 mg via ORAL
  Filled 2013-07-29 (×3): qty 1

## 2013-07-29 MED ORDER — PREDNISONE 20 MG PO TABS
20.0000 mg | ORAL_TABLET | Freq: Every day | ORAL | Status: DC
  Administered 2013-07-30 – 2013-08-03 (×5): 20 mg via ORAL
  Filled 2013-07-29 (×5): qty 1

## 2013-07-29 MED ORDER — IRBESARTAN 75 MG PO TABS
75.0000 mg | ORAL_TABLET | Freq: Every day | ORAL | Status: DC
  Administered 2013-07-29 – 2013-08-03 (×6): 75 mg via ORAL
  Filled 2013-07-29 (×6): qty 1

## 2013-07-29 MED ORDER — APIXABAN 5 MG PO TABS
5.0000 mg | ORAL_TABLET | Freq: Two times a day (BID) | ORAL | Status: DC
  Administered 2013-07-29 – 2013-08-03 (×12): 5 mg via ORAL
  Filled 2013-07-29 (×12): qty 1

## 2013-07-29 MED ORDER — OSELTAMIVIR PHOSPHATE 75 MG PO CAPS
75.0000 mg | ORAL_CAPSULE | Freq: Two times a day (BID) | ORAL | Status: AC
  Administered 2013-07-29 – 2013-08-02 (×10): 75 mg via ORAL
  Filled 2013-07-29 (×10): qty 1

## 2013-07-29 MED ORDER — SIMVASTATIN 20 MG PO TABS
40.0000 mg | ORAL_TABLET | Freq: Every day | ORAL | Status: DC
  Administered 2013-07-29 – 2013-08-02 (×6): 40 mg via ORAL
  Filled 2013-07-29 (×6): qty 2

## 2013-07-29 MED ORDER — INSULIN ASPART 100 UNIT/ML ~~LOC~~ SOLN
0.0000 [IU] | Freq: Three times a day (TID) | SUBCUTANEOUS | Status: DC
  Administered 2013-07-29: 3 [IU] via SUBCUTANEOUS
  Administered 2013-07-29 – 2013-07-30 (×2): 2 [IU] via SUBCUTANEOUS
  Administered 2013-07-30: 3 [IU] via SUBCUTANEOUS
  Administered 2013-07-31 – 2013-08-01 (×3): 2 [IU] via SUBCUTANEOUS
  Administered 2013-08-01 – 2013-08-02 (×2): 3 [IU] via SUBCUTANEOUS
  Administered 2013-08-02: 2 [IU] via SUBCUTANEOUS

## 2013-07-29 MED ORDER — HYDROCODONE-ACETAMINOPHEN 7.5-325 MG PO TABS
1.0000 | ORAL_TABLET | Freq: Four times a day (QID) | ORAL | Status: DC | PRN
  Administered 2013-07-29 – 2013-08-02 (×11): 1 via ORAL
  Filled 2013-07-29 (×11): qty 1

## 2013-07-29 MED ORDER — PANTOPRAZOLE SODIUM 40 MG PO TBEC
40.0000 mg | DELAYED_RELEASE_TABLET | Freq: Two times a day (BID) | ORAL | Status: DC
  Administered 2013-07-29 – 2013-08-03 (×12): 40 mg via ORAL
  Filled 2013-07-29 (×13): qty 1

## 2013-07-29 MED ORDER — HYDROCODONE-HOMATROPINE 5-1.5 MG/5ML PO SYRP
5.0000 mL | ORAL_SOLUTION | ORAL | Status: DC | PRN
  Administered 2013-07-29 – 2013-08-03 (×12): 5 mL via ORAL
  Filled 2013-07-29 (×12): qty 5

## 2013-07-29 MED ORDER — BENZONATATE 100 MG PO CAPS
100.0000 mg | ORAL_CAPSULE | Freq: Three times a day (TID) | ORAL | Status: DC
  Administered 2013-07-29 – 2013-08-03 (×16): 100 mg via ORAL
  Filled 2013-07-29 (×16): qty 1

## 2013-07-29 MED ORDER — ONDANSETRON HCL 4 MG/2ML IJ SOLN
4.0000 mg | Freq: Four times a day (QID) | INTRAMUSCULAR | Status: DC | PRN
  Administered 2013-07-29 – 2013-08-01 (×5): 4 mg via INTRAVENOUS
  Filled 2013-07-29 (×5): qty 2

## 2013-07-29 MED ORDER — SODIUM CHLORIDE 0.9 % IJ SOLN
3.0000 mL | Freq: Two times a day (BID) | INTRAMUSCULAR | Status: DC
  Administered 2013-07-29 – 2013-08-02 (×5): 3 mL via INTRAVENOUS

## 2013-07-29 MED ORDER — ACETAMINOPHEN 325 MG PO TABS: 650.0000 mg | ORAL_TABLET | Freq: Four times a day (QID) | ORAL | Status: DC | PRN

## 2013-07-29 NOTE — H&P (Signed)
[facility] History and Physical  Erika Lee GLO:[id number] DOB: [birth year] DOA: 07/28/2013   PCP: Alonza Bogus, MD  Specialists: Dr. Rayann Heman is Erika Lee cardiologist  Chief Complaint: Weakness, and fatigue  HPI: Erika Lee is a 68 y.o. female with a past medical history of paroxysmal atrial fibrillation underwent ablation on January 16, history of diabetes, hypertension, who has been having a cough ever since Erika Lee ablation procedure on the 16th of this month. Erika Lee started coughing about a day or 2 after the procedure. Erika Lee initially was bringing up a creamy colored expectoration, but in the last many days it has been very dry. Erika Lee's had difficulty breathing even at rest. But denies any chest pain currently. Erika Lee did have an episode of chest pain on Sunday. Erika Lee actually presented to the emergency room on January 24 and at that time Erika Lee was diagnosed with dehydration. Erika Lee was given IV fluids, and Erika Lee was discharged home. Erika Lee went to see Dr. Rayann Heman on January 22 for post ablation followup. He was also concerned about the cough and felt that maybe patient was mildly fluid overloaded and gave Erika Lee prescriptions for furosemide which Erika Lee took for 3 days. Erika Lee denies any fever or chills. No nausea, vomiting, or diarrhea. Erika Lee has felt lightheaded. Denies focal deficits or seizure type activity. Denies any leg swelling. No blood in Erika Lee sputum. Erika Lee actually underwent a non contrast CT scan of Erika Lee chest on January 26, which did not reveal any concerning findings. Erika Lee comes back today because Erika Lee fatigue persists, and Erika Lee cough persists.  Home Medications: Prior to Admission medications   Medication Sig Start Date End Date Taking? Authorizing Provider  apixaban (ELIQUIS) 5 MG TABS tablet Take 1 tablet (5 mg total) by mouth 2 (two) times daily. 06/13/13  Yes Alonza Bogus, MD  esomeprazole (NEXIUM) 40 MG capsule Take 1 capsule (40 mg total) by mouth 2 (two) times daily. 07/22/13  Yes Thompson Grayer, MD    guaiFENesin-codeine (ROBITUSSIN AC) 100-10 MG/5ML syrup Take 5 mLs by mouth 3 (three) times daily as needed for cough. 07/26/13  Yes Thompson Grayer, MD  hydrochlorothiazide 25 MG tablet Take 25 mg by mouth daily.     Yes Historical Provider, MD  HYDROcodone-acetaminophen (NORCO) 7.5-325 MG per tablet Take 1 tablet by mouth every 6 (six) hours as needed for moderate pain.   Yes Historical Provider, MD  lisinopril (PRINIVIL,ZESTRIL) 10 MG tablet Take 10 mg by mouth daily.     Yes Historical Provider, MD  loratadine (CLARITIN) 10 MG tablet Take 10 mg by mouth daily as needed for allergies.   Yes Historical Provider, MD  metFORMIN (GLUCOPHAGE) 500 MG tablet Take 1 tablet (500 mg total) by mouth daily. 07/17/13  Yes Tarri Fuller, PA-C  metoprolol succinate (TOPROL XL) 25 MG 24 hr tablet Take 0.5 tablets (12.5 mg total) by mouth daily. 05/25/13  Yes Satira Sark, MD  Potassium Gluconate 550 MG TABS Take 1 tablet (550 mg total) by mouth 2 (two) times daily. 06/13/13  Yes Alonza Bogus, MD  simvastatin (ZOCOR) 40 MG tablet Take 40 mg by mouth at bedtime.     Yes Historical Provider, MD    Allergies:  Allergies  Allergen Reactions  . Clindamycin/Lincomycin Rash  . Doxycycline Other (See Comments)    Makes heart race  . Adhesive [Tape] Rash  . Latex Rash  . Sulfonamide Derivatives Nausea And Vomiting    Past Medical History: Past Medical History  Diagnosis Date  . Hyperlipidemia   .  Multinodular goiter   . Paroxysmal atrial fibrillation     s/p PVI by [name] 07/16/2013  . GERD (gastroesophageal reflux disease)   . Essential hypertension, benign   . Palpitations   . Diabetes mellitus     Borderline  . Arthritis   . Atrial flutter     s/p CTI by [name] 07/16/2013    Past Surgical History  Procedure Laterality Date  . Biopsy thyroid    . Tonsillectomy    . Colonoscopy  09/10/2011    Procedure: COLONOSCOPY;  Surgeon: Jamesetta So, MD;  Location: [facility];  Service:  Gastroenterology;  Laterality: N/A;  . Lesion removal N/A 05/03/2013    Procedure: EXCISION CYST, BACK;  Surgeon: Jamesetta So, MD;  Location: [facility];  Service: General;  Laterality: N/A;  . Tee without cardioversion N/A 07/15/2013    Procedure: TRANSESOPHAGEAL ECHOCARDIOGRAM (TEE);  Surgeon: Lelon Perla, MD;  Location: [facility] ENDOSCOPY;  Service: Cardiovascular;  Laterality: N/A;  . Ablation  07/16/2013    PVI and CTI ablation by [name]    Social History: Erika Lee lives with Erika Lee husband. Erika Lee quit smoking in 1995. Denies any alcohol use. Usually independent with daily activities  Family History:  Family History  Problem Relation Age of Onset  . Stroke Mother      Review of Systems - History obtained from the patient General ROS: positive for  - fatigue Psychological ROS: negative Ophthalmic ROS: negative ENT ROS: negative Allergy and Immunology ROS: negative Hematological and Lymphatic ROS: negative Endocrine ROS: negative Respiratory ROS: positive for - cough and shortness of breath Cardiovascular ROS: as in hpi Gastrointestinal ROS: no abdominal pain, change in bowel habits, or black or bloody stools Genito-Urinary ROS: no dysuria, trouble voiding, or hematuria Musculoskeletal ROS: negative Neurological ROS: no TIA or stroke symptoms Dermatological ROS: negative  Physical Examination  Filed Vitals:   07/28/13 2047 07/28/13 2315  BP: 103/67 102/61  Pulse: 62 52  Temp: 98.5 F (36.9 C)   TempSrc: Oral   Resp: 20 20  Height: _0  (1.651 m)   Weight: 77.111 kg (170 lb)   SpO2: 99% 92%    General appearance: alert, cooperative, appears stated age and no distress Head: Normocephalic, without obvious abnormality, atraumatic Eyes: conjunctivae/corneas clear. PERRL, EOM's intact.  Throat: lips, mucosa, and tongue normal; teeth and gums normal Neck: no JVD and thyroid: enlarged Back: symmetric, no curvature. ROM normal. No CVA tenderness. Resp: Scattered wheezing, but no  crackles. Good air entry bilaterally. Cardio: regular rate and rhythm, S1, S2 normal, no murmur, click, rub or gallop GI: soft, non-tender; bowel sounds normal; no masses,  no organomegaly Extremities: extremities normal, atraumatic, no cyanosis or edema Pulses: 2+ and symmetric Skin: Skin color, texture, turgor normal. No rashes or lesions Lymph nodes: Cervical, supraclavicular, and axillary nodes normal. Neurologic: Erika Lee is alert and oriented x3. No focal neurological deficits are present.  Laboratory Data: Results for orders placed during the hospital encounter of 07/28/13 (from the past 48 hour(s))  PRO B NATRIURETIC PEPTIDE     Status: Abnormal   Collection Time    07/28/13  9:54 PM      Result Value Range   Pro B Natriuretic peptide (BNP) 134.5 (*) 0 - 125 pg/mL  CBC     Status: Abnormal   Collection Time    07/28/13  9:54 PM      Result Value Range   WBC 4.4  4.0 - 10.5 K/uL  RBC 4.60  3.87 - 5.11 MIL/uL   Hemoglobin 13.7  12.0 - 15.0 g/dL   HCT 37.6  36.0 - 46.0 %   MCV 81.7  78.0 - 100.0 fL   MCH 29.8  26.0 - 34.0 pg   MCHC 36.4 (*) 30.0 - 36.0 g/dL   RDW 11.9  11.5 - 15.5 %   Platelets 200  150 - 400 K/uL  COMPREHENSIVE METABOLIC PANEL     Status: Abnormal   Collection Time    07/28/13  9:54 PM      Result Value Range   Sodium 122 (*) 137 - 147 mEq/L   Potassium 3.7  3.7 - 5.3 mEq/L   Chloride 83 (*) 96 - 112 mEq/L   CO2 24  19 - 32 mEq/L   Glucose, Bld 165 (*) 70 - 99 mg/dL   BUN 10  6 - 23 mg/dL   Creatinine, Ser 0.80  0.50 - 1.10 mg/dL   Calcium 9.2  8.4 - 10.5 mg/dL   Total Protein 6.8  6.0 - 8.3 g/dL   Albumin 3.4 (*) 3.5 - 5.2 g/dL   AST 28  0 - 37 U/L   ALT 20  0 - 35 U/L   Alkaline Phosphatase 64  39 - 117 U/L   Total Bilirubin 0.2 (*) 0.3 - 1.2 mg/dL   GFR calc non Af Amer 74 (*) >90 mL/min   GFR calc Af Amer 85 (*) >90 mL/min   Comment: (NOTE)     The eGFR has been calculated using the CKD EPI equation.     This calculation has not been validated  in all clinical situations.     eGFR's persistently <90 mL/min signify possible Chronic Kidney     Disease.  POCT I-STAT TROPONIN I     Status: None   Collection Time    07/28/13 10:22 PM      Result Value Range   Troponin i, poc 0.01  0.00 - 0.08 ng/mL   Comment 3            Comment: Due to the release kinetics of cTnI,     a negative result within the first hours     of the onset of symptoms does not rule out     myocardial infarction with certainty.     If myocardial infarction is still suspected,     repeat the test at appropriate intervals.    Radiology Reports: Dg Chest 2 View  07/28/2013   CLINICAL DATA:  Cough, chest pain  EXAM: CHEST  2 VIEW  COMPARISON:  07/24/2013  FINDINGS: Mild cardiomegaly with similar vascular and interstitial prominence, nonspecific. No definite CHF or pneumonia. No effusion or pneumothorax. Trachea midline. Aortic atherosclerosis noted and degenerative changes of the spine.  IMPRESSION: Cardiomegaly without acute process.  Stable exam   Electronically Signed   By: Daryll Brod M.D.   On: 07/28/2013 23:34    Electrocardiogram: EKG shows sinus bradycardia at 55 beats per minute. Normal axis. Intervals are normal. No Q waves. No concerning ST or T-wave changes are noted.  Problem List  Principal Problem:   Hyponatremia Active Problems:   Essential hypertension, benign   GERD   Paroxysmal atrial fibrillation   Type 2 diabetes mellitus   Generalized weakness   Cough   Assessment: This is a 68 year old, Caucasian female, who underwent catheter ablation for atrial fibrillation in January 16 and then started having a cough and has been feeling weak ever since, which  has progressively gotten worse. Erika Lee is found to be hyponatremic, which could account for Erika Lee fatigue and generalized weakness. Hyponatremia is probably due to a combination of dehydration from poor oral intake, as well as Furosemide that Erika Lee was prescribed recently, along with the  hydrochlorothiazide that Erika Lee has been taking for a long time. Reason for the cough is not entirely clear. Erika Lee doesn't have any hoarseness, so, vocal cord injury from recent intubation is less likely. No other concerning findings on chest x-ray. Occasional wheezes heard on physical examination.  Plan: #1 hyponatremia: Erika Lee's also hypochloremic. Erika Lee is probably dehydrated, and also was on diuretics, which are to be blamed as well. So, we will give Erika Lee normal saline. Check urine osmolality. Repeat Erika Lee sodium levels in the morning. Hold Erika Lee hydrochlorothiazide for tonight.  #2 cough: Etiology remains unclear. Erika Lee does Erika Lee does have some wheeze. Will try a breathing treatment. Cough suppressants. Further management per Dr. Luan Pulling in the morning.  #3 history of proximal atrial fibrillation, status post recent ablation: Erika Lee currently is in sinus rhythm. Continue with Erika Lee anticoagulation. Continue with the beta blocker. Monitor on telemetry for now.  #4 history of type 2 diabetes: Sliding scale insulin coverage will be initiated.  #5 history of hypertension: Monitor blood pressure closely.  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 Erika Lee tells me that Erika Lee has been evaluated for this multiple times in the past. Defer this to Erika Lee PCP.   DVT Prophylaxis: Erika Lee is on full anticoagulation Code Status: Full code Family Communication: Discussed with the patient and Erika Lee husband  Disposition Plan: Observe to telemetry. Dr. Luan Pulling to assume care in AM.  Further management decisions will depend on results of further testing and patient's response to treatment.  [facility]  [facility] Pager [phone]  If 7PM-7AM, please contact night-coverage [email or web address] Password [facility]  07/29/2013, 12:18 AM

## 2013-07-29 NOTE — Progress Notes (Signed)
UR completed. Patient changed to inpatient- requiring IVF @ 100cc/hr and IV antibiotics.

## 2013-07-30 LAB — GLUCOSE, CAPILLARY
GLUCOSE-CAPILLARY: 168 mg/dL — AB (ref 70–99)
GLUCOSE-CAPILLARY: 116 mg/dL — AB (ref 70–99)
GLUCOSE-CAPILLARY: 147 mg/dL — AB (ref 70–99)
Glucose-Capillary: 164 mg/dL — ABNORMAL HIGH (ref 70–99)

## 2013-07-30 LAB — BASIC METABOLIC PANEL
BUN: 8 mg/dL (ref 6–23)
CO2: 27 mEq/L (ref 19–32)
Calcium: 8.9 mg/dL (ref 8.4–10.5)
Chloride: 102 mEq/L (ref 96–112)
Creatinine, Ser: 0.87 mg/dL (ref 0.50–1.10)
GFR, EST AFRICAN AMERICAN: 77 mL/min — AB (ref >90)
GFR, EST NON AFRICAN AMERICAN: 66 mL/min — AB (ref >90)
Glucose, Bld: 111 mg/dL — ABNORMAL HIGH (ref 70–99)
POTASSIUM: 4.6 meq/L (ref 3.7–5.3)
SODIUM: 138 meq/L (ref 137–147)

## 2013-07-30 MED ORDER — POLYETHYLENE GLYCOL 3350 17 G PO PACK
17.0000 g | PACK | Freq: Every day | ORAL | Status: DC | PRN
  Administered 2013-07-30: 17 g via ORAL
  Filled 2013-07-30: qty 1

## 2013-07-30 NOTE — Progress Notes (Signed)
Patient has Miralax ordered PRN.  States that she has tried this and that it does not work for her.  States that she takes Dulcolax tablets at home.  Dr. Legrand Rams paged.

## 2013-07-30 NOTE — Progress Notes (Signed)
Dr. Legrand Rams returned page and notified of patient's heart rate and feeling light headed.  Dr. Legrand Rams gave order to hold Metoprolol if patient's heart rate is <60.  Patient also requesting something for constipation.  Dr. Legrand Rams gave order for the patient to receive Miralax 17 gams daily prn constipation.  Orders followed.  Will continue to monitor.

## 2013-07-30 NOTE — Progress Notes (Signed)
RN entered room to check on patient.  Patient stated that she had an episode of feeling lightheaded and nausea.  Patient received Zofran earlier.  Patient states does not feel light headed now.  Heart rate 47-51 on monitor.  RN called Dr. Legrand Rams (covering for Dr. Luan Pulling) at his office.  Dr. Legrand Rams in with patient.  Message left for Dr. Legrand Rams to return call to RN.

## 2013-07-30 NOTE — Progress Notes (Signed)
Subjective: Patient is alert and awake. She feels weak and tired. She is being treated for influenza A.  Objective: Vital signs in last 24 hours: Temp:  [97.7 F (36.5 C)-98.7 F (37.1 C)] 97.7 F (36.5 C) (01/30 0622) Pulse Rate:  [41-58] 41 (01/30 0622) Resp:  [20] 20 (01/30 0622) BP: (101-120)/(63-76) 104/65 mmHg (01/30 0622) SpO2:  [94 %-96 %] 95 % (01/30 0622) Weight change:  Last BM Date: 07/28/13  Intake/Output from previous day: 01/29 0701 - 01/30 0700 In: 3030 [P.O.:680; I.V.:2300; IV Piggyback:50] Out: 650 [Urine:650]  PHYSICAL EXAM General appearance: alert, fatigued and no distress Resp: diminished breath sounds bilaterally and rhonchi bilaterally Cardio: S1 and S2 heard bradycardic GI: soft, non-tender; bowel sounds normal; no masses,  no organomegaly Extremities: extremities normal, atraumatic, no cyanosis or edema  Lab Results:    @labtest @ [facility] No results found for this basename: PHART, PCO2, PO2ART, TCO2, HCO3,  in the last 72 hours CULTURES No results found for this or any previous visit (from the past 240 hour(s)). Studies/Results: Dg Chest 2 View  07/28/2013   CLINICAL DATA:  Cough, chest pain  EXAM: CHEST  2 VIEW  COMPARISON:  07/24/2013  FINDINGS: Mild cardiomegaly with similar vascular and interstitial prominence, nonspecific. No definite CHF or pneumonia. No effusion or pneumothorax. Trachea midline. Aortic atherosclerosis noted and degenerative changes of the spine.  IMPRESSION: Cardiomegaly without acute process.  Stable exam   Electronically Signed   By: Daryll Brod M.D.   On: 07/28/2013 23:34    Medications: I have reviewed the patient's current medications.  Assesment: Principal Problem:   Hyponatremia Active Problems:   Essential hypertension, benign   GERD   Paroxysmal atrial fibrillation   Type 2 diabetes mellitus   Generalized weakness   Cough bradycardia   Plan: Medications reviewed Continue tamiflu Will adjust IV  fluid Continue regular treatment    LOS: 2 days   , 07/30/2013, 7:58 AM

## 2013-07-30 NOTE — Progress Notes (Signed)
Late Entry from 07/29/2013, 01:00AM.    Received report from ED nurse and was informed that patient was afebrile and had presented to ED with shortness of breath times 2 weeks, generalized aching, and a cough.  Patient was being admitted for hyponatremia but I asked the ED nurse if patient had been screened for influenza.  ED nurse stated that patient had not be screened but she would address it with the MD and would screen patient if ordered by the MD before arriving to the floor.  Patient was not screened prior to arriving because she did not meet the influenza protocol due to being afebrile.

## 2013-07-30 NOTE — Progress Notes (Signed)
Patient SB at times.  Heart rate 34-37 at times.  Dr. Legrand Rams on unit to see patient and notified.

## 2013-07-30 NOTE — Progress Notes (Signed)
NAMEMAYOLA, MCBAIN                 ACCOUNT NO.:  [id number]  MEDICAL RECORD NO.:  [id number]  LOCATION:  A325                          FACILITY:  [facility]  PHYSICIAN:   L. Luan Pulling, M.D.DATE OF BIRTH:  06-Jan-1946  DATE OF PROCEDURE: DATE OF DISCHARGE:                                PROGRESS NOTE   ASSUMPTION OF CARE NOTE  Ms. Borror was admitted early this morning with cough, congestion, and hyponatremia.  Her sodium is much improved.  Her heart rate has been somewhat low, so I am going to hold her Toprol this morning.  She has significant and severe cough, but did not have pneumonia either on chest x-ray or CT chest that she had done earlier this week.  I am concerned that she may have influenza, although she has not had any documented fever, but she certainly has cough and congestion.  She may simply have severe acute bronchitis.  My plan is going to be to add antibiotics, add low-dose steroids, flutter valve, Mucinex.  Continue other treatments.      L. Luan Pulling, M.D.     ELH/MEDQ  D:  07/29/2013  T:  07/30/2013  Job:  [id number]

## 2013-07-30 NOTE — Care Management Note (Addendum)
    Page 1 of 1   08/03/2013     12:05:52 PM   CARE MANAGEMENT NOTE 08/03/2013  Patient:  Erika Lee, Erika Lee   Account Number:  [id number]  Date Initiated:  07/30/2013  Documentation initiated by:  Claretha Cooper  Subjective/Objective Assessment:   Pt admitted from home where she lives with her spouse. Indpendent with ADL. No HH DME needs identified     Action/Plan:   Anticipated DC Date:     Anticipated DC Plan:  [facility]  CM consult      Choice offered to / List presented to:             Status of service:  Completed, signed off Medicare Important Message given?   (If response is "NO", the following Medicare IM given date fields will be blank) Date Medicare IM given:   Date Additional Medicare IM given:    Discharge Disposition:    Per UR Regulation:    If discussed at Long Length of Stay Meetings, dates discussed:   08/03/2013    Comments:  07/30/13 Claretha Cooper RN BSN CM

## 2013-07-31 LAB — BASIC METABOLIC PANEL
BUN: 5 mg/dL — ABNORMAL LOW (ref 6–23)
CALCIUM: 9.1 mg/dL (ref 8.4–10.5)
CHLORIDE: 101 meq/L (ref 96–112)
CO2: 25 meq/L (ref 19–32)
Creatinine, Ser: 0.69 mg/dL (ref 0.50–1.10)
GFR calc Af Amer: >90 mL/min (ref >90)
GFR calc non Af Amer: 87 mL/min — ABNORMAL LOW (ref >90)
Glucose, Bld: 107 mg/dL — ABNORMAL HIGH (ref 70–99)
Potassium: 4 mEq/L (ref 3.7–5.3)
SODIUM: 137 meq/L (ref 137–147)

## 2013-07-31 LAB — GLUCOSE, CAPILLARY
Glucose-Capillary: 121 mg/dL — ABNORMAL HIGH (ref 70–99)
Glucose-Capillary: 103 mg/dL — ABNORMAL HIGH (ref 70–99)
Glucose-Capillary: 140 mg/dL — ABNORMAL HIGH (ref 70–99)
Glucose-Capillary: 166 mg/dL — ABNORMAL HIGH (ref 70–99)

## 2013-07-31 NOTE — Progress Notes (Signed)
PCP: Alonza Bogus, MD Primary Cardiologist:  Dr Elige Ko Erika Lee is a 68 y.o. female who presents today for electrophysiology followup.  She underwent afib ablation 07/16/13.  Her cough has not improved.  She only has chest pain during forceful cough at this point.  She denies hemoptysis.  She denies fevers or chills. She has mild SOB.  She denies any further pain when swallowing and eating.  She is having trouble sleeping because of her cough. Today, she denies symptoms of palpitations, exertional chest pain,  lower extremity edema, dizziness, presyncope, or syncope.  The patient is otherwise without complaint today.   Past Medical History  Diagnosis Date  . Hyperlipidemia   . Multinodular goiter   . Paroxysmal atrial fibrillation     s/p PVI by [name] 07/16/2013  . GERD (gastroesophageal reflux disease)   . Essential hypertension, benign   . Palpitations   . Diabetes mellitus     Borderline  . Arthritis   . Atrial flutter     s/p CTI by [name] 07/16/2013   Past Surgical History  Procedure Laterality Date  . Biopsy thyroid    . Tonsillectomy    . Colonoscopy  09/10/2011    Procedure: COLONOSCOPY;  Surgeon: Jamesetta So, MD;  Location: [facility];  Service: Gastroenterology;  Laterality: N/A;  . Lesion removal N/A 05/03/2013    Procedure: EXCISION CYST, BACK;  Surgeon: Jamesetta So, MD;  Location: [facility] ORS;  Service: General;  Laterality: N/A;  . Tee without cardioversion N/A 07/15/2013    Procedure: TRANSESOPHAGEAL ECHOCARDIOGRAM (TEE);  Surgeon: Lelon Perla, MD;  Location: [facility] ENDOSCOPY;  Service: Cardiovascular;  Laterality: N/A;  . Ablation  07/16/2013    PVI and CTI ablation by [name]    No current facility-administered medications for this visit.   No current outpatient prescriptions on file.   Facility-Administered Medications Ordered in Other Visits  Medication Dose Route Frequency Provider Last Rate Last Dose  . 0.9 %  sodium chloride infusion    Intravenous Continuous Rosita Fire, MD 50 mL/hr at 07/30/13 2103    . acetaminophen (TYLENOL) tablet 650 mg  650 mg Oral Q6H PRN Bonnielee Haff, MD       Or  . acetaminophen (TYLENOL) suppository 650 mg  650 mg Rectal Q6H PRN Bonnielee Haff, MD      . albuterol (PROVENTIL) (2.5 MG/3ML) 0.083% nebulizer solution 2.5 mg  2.5 mg Nebulization Q2H PRN Bonnielee Haff, MD      . apixaban (ELIQUIS) tablet 5 mg  5 mg Oral BID Bonnielee Haff, MD   5 mg at 07/31/13 1133  . benzonatate (TESSALON) capsule 100 mg  100 mg Oral TID Bonnielee Haff, MD   100 mg at 07/31/13 1728  . cefTRIAXone (ROCEPHIN) 1 g in dextrose 5 % 50 mL IVPB  1 g Intravenous Q24H Alonza Bogus, MD   1 g at 07/31/13 0907  . guaiFENesin (MUCINEX) 12 hr tablet 1,200 mg  1,200 mg Oral BID Alonza Bogus, MD   1,200 mg at 07/31/13 0908  . HYDROcodone-acetaminophen (NORCO) 7.5-325 MG per tablet 1 tablet  1 tablet Oral Q6H PRN Bonnielee Haff, MD   1 tablet at 07/31/13 1431  . HYDROcodone-homatropine (HYCODAN) 5-1.5 MG/5ML syrup 5 mL  5 mL Oral Q4H PRN Alonza Bogus, MD   5 mL at 07/31/13 1236  . insulin aspart (novoLOG) injection 0-15 Units  0-15 Units Subcutaneous TID WC Bonnielee Haff, MD   2  Units at 07/31/13 1728  . irbesartan (AVAPRO) tablet 75 mg  75 mg Oral Daily Alonza Bogus, MD   75 mg at 07/31/13 0908  . metoprolol succinate (TOPROL-XL) 24 hr tablet 12.5 mg  12.5 mg Oral Daily Rosita Fire, MD      . ondansetron (ZOFRAN) tablet 4 mg  4 mg Oral Q6H PRN Bonnielee Haff, MD       Or  . ondansetron (ZOFRAN) injection 4 mg  4 mg Intravenous Q6H PRN Bonnielee Haff, MD   4 mg at 07/30/13 1713  . oseltamivir (TAMIFLU) capsule 75 mg  75 mg Oral BID Alonza Bogus, MD   75 mg at 07/31/13 1134  . pantoprazole (PROTONIX) EC tablet 40 mg  40 mg Oral BID Bonnielee Haff, MD   40 mg at 07/31/13 1133  . polyethylene glycol (MIRALAX / GLYCOLAX) packet 17 g  17 g Oral Daily PRN Rosita Fire, MD   17 g at 07/30/13 2331  . predniSONE  (DELTASONE) tablet 20 mg  20 mg Oral Q breakfast Alonza Bogus, MD   20 mg at 07/31/13 0908  . simvastatin (ZOCOR) tablet 40 mg  40 mg Oral QHS Bonnielee Haff, MD   40 mg at 07/30/13 2103  . sodium chloride 0.9 % injection 3 mL  3 mL Intravenous Q12H Bonnielee Haff, MD   3 mL at 07/29/13 0911    Physical Exam: Filed Vitals:   07/26/13 1146  BP: 130/78  Pulse: 87  Height: 5\' 5"  (1.651 m)  Weight: 171 lb 12.8 oz (77.928 kg)    GEN- The patient is not ill appearing, alert and oriented x 3 today.   Head- normocephalic, atraumatic Eyes-  Sclera clear, conjunctiva pink Ears- hearing intact Oropharynx- clear Lungs- clear, normal work of breathing with frequent cough Heart- Regular rate and rhythm, no murmurs, rubs or gallops, PMI not laterally displaced GI- soft, NT, ND, + BS Extremities- no clubbing, cyanosis, or edema  Assessment and Plan:   1. Cough/ sob post ablation Could be aspiration though exam is not suggestive of this.  She did not improve with diuresis.  I will therefore obtain a chest CT to evaluate for any abnormalities related to her recent ablation.  2. Odynophagia Resolved.  Continue nexium  BID and carafate tidac/hs  Return to reassessment on Monday. She should go to the ER if symptoms worsen.

## 2013-07-31 NOTE — Progress Notes (Signed)
Subjective: Patient continued to have cough and congestion. She is also wheezing in the evening. Nofever or chills.  Objective: Vital signs in last 24 hours: Temp:  [97.5 F (36.4 C)-98.2 F (36.8 C)] 98.2 F (36.8 C) (01/31 0652) Pulse Rate:  [58-62] 58 (01/31 0652) Resp:  [20] 20 (01/31 0652) BP: (111-131)/(63-71) 111/71 mmHg (01/31 0652) SpO2:  [95 %-97 %] 95 % (01/31 0652) Weight change:  Last BM Date: 07/28/13  Intake/Output from previous day: 01/30 0701 - 01/31 0700 In: 4166 [P.O.:358; I.V.:1425; IV Piggyback:50] Out: 300 [Urine:300]  PHYSICAL EXAM General appearance: alert, fatigued and no distress Resp: diminished breath sounds bilaterally and rhonchi bilaterally Cardio: S1 and S2 heard bradycardic GI: soft, non-tender; bowel sounds normal; no masses,  no organomegaly Extremities: extremities normal, atraumatic, no cyanosis or edema  Lab Results:    @labtest @ [facility] No results found for this basename: PHART, PCO2, PO2ART, TCO2, HCO3,  in the last 72 hours CULTURES No results found for this or any previous visit (from the past 240 hour(s)). Studies/Results: No results found.  Medications: I have reviewed the patient's current medications.  Assesment: Principal Problem:   Hyponatremia Active Problems:   Essential hypertension, benign   GERD   Paroxysmal atrial fibrillation   Type 2 diabetes mellitus   Generalized weakness   Cough bradycardia   Plan: Medications reviewed Continue tamiflu Will adjust IV fluid Will monitor cbc/bmp    LOS: 3 days   , 07/31/2013, 7:15 AM

## 2013-08-01 LAB — CBC
HCT: 36 % (ref 36.0–46.0)
Hemoglobin: 12.6 g/dL (ref 12.0–15.0)
MCH: 30 pg (ref 26.0–34.0)
MCHC: 35 g/dL (ref 30.0–36.0)
MCV: 85.7 fL (ref 78.0–100.0)
PLATELETS: 192 10*3/uL (ref 150–400)
RBC: 4.2 MIL/uL (ref 3.87–5.11)
RDW: 12.3 % (ref 11.5–15.5)
WBC: 7.3 10*3/uL (ref 4.0–10.5)

## 2013-08-01 LAB — BASIC METABOLIC PANEL
BUN: 8 mg/dL (ref 6–23)
CALCIUM: 9.2 mg/dL (ref 8.4–10.5)
CO2: 29 meq/L (ref 19–32)
CREATININE: 0.74 mg/dL (ref 0.50–1.10)
Chloride: 99 mEq/L (ref 96–112)
GFR calc Af Amer: >90 mL/min (ref >90)
GFR calc non Af Amer: 85 mL/min — ABNORMAL LOW (ref >90)
Glucose, Bld: 98 mg/dL (ref 70–99)
Potassium: 4.1 mEq/L (ref 3.7–5.3)
Sodium: 137 mEq/L (ref 137–147)

## 2013-08-01 LAB — GLUCOSE, CAPILLARY
GLUCOSE-CAPILLARY: 180 mg/dL — AB (ref 70–99)
Glucose-Capillary: 108 mg/dL — ABNORMAL HIGH (ref 70–99)
Glucose-Capillary: 128 mg/dL — ABNORMAL HIGH (ref 70–99)
Glucose-Capillary: 130 mg/dL — ABNORMAL HIGH (ref 70–99)

## 2013-08-01 NOTE — Plan of Care (Signed)
Problem: Phase II Progression Outcomes Goal: Progress activity as tolerated unless otherwise ordered Outcome: Progressing 08/01/13 1454 Patient up to chair, ambulates in room independently. Instructed to call for assist if feeling weak, dizzy, or after receiving pain medication. Verbalizes understanding of fall prevention/safety plan. Donavan Foil, RN

## 2013-08-01 NOTE — Progress Notes (Signed)
Heart rate in the 30's at times. Dr Legrand Rams aware of this this am. No changes were made. Pt states since her ablasion her doctor told her her heart may do funny things until it heals. Will continue to monitor.

## 2013-08-01 NOTE — Progress Notes (Signed)
Subjective: Patient feels better. Less cough and congestion. No fever or chills.   Objective: Vital signs in last 24 hours: Temp:  [98 F (36.7 C)-98.2 F (36.8 C)] 98 F (36.7 C) (02/01 0623) Pulse Rate:  [46-64] 54 (02/01 0623) Resp:  [20] 20 (02/01 0623) BP: (109-147)/(70-72) 129/72 mmHg (02/01 0623) SpO2:  [95 %-96 %] 95 % (02/01 0623) Weight change:  Last BM Date: 08/01/13  Intake/Output from previous day: 01/31 0701 - 02/01 0700 In: 1870 [P.O.:720; I.V.:1100; IV Piggyback:50] Out: -   PHYSICAL EXAM General appearance: alert, fatigued and no distress Resp: diminished breath sounds bilaterally and rhonchi bilaterally Cardio: S1 and S2 heard bradycardic GI: soft, non-tender; bowel sounds normal; no masses,  no organomegaly Extremities: extremities normal, atraumatic, no cyanosis or edema  Lab Results:    @labtest @ [facility] No results found for this basename: PHART, PCO2, PO2ART, TCO2, HCO3,  in the last 72 hours CULTURES No results found for this or any previous visit (from the past 240 hour(s)). Studies/Results: No results found.  Medications: I have reviewed the patient's current medications.  Assesment: Principal Problem:   Hyponatremia Active Problems:   Essential hypertension, benign   GERD   Paroxysmal atrial fibrillation   Type 2 diabetes mellitus   Generalized weakness   Cough bradycardia   Plan: Medications reviewed Continue tamiflu Continue current treatment   LOS: 4 days   , 08/01/2013, 11:24 AM

## 2013-08-02 ENCOUNTER — Ambulatory Visit: Payer: Medicare Other | Admitting: Internal Medicine

## 2013-08-02 LAB — GLUCOSE, CAPILLARY
GLUCOSE-CAPILLARY: 138 mg/dL — AB (ref 70–99)
GLUCOSE-CAPILLARY: 170 mg/dL — AB (ref 70–99)
Glucose-Capillary: 101 mg/dL — ABNORMAL HIGH (ref 70–99)
Glucose-Capillary: 129 mg/dL — ABNORMAL HIGH (ref 70–99)

## 2013-08-02 NOTE — Progress Notes (Signed)
Nutrition Brief Note  RD pulled to chart due to LOS.   Wt Readings from Last 15 Encounters:  07/28/13 170 lb (77.111 kg)  07/26/13 171 lb 12.8 oz (77.928 kg)  07/24/13 170 lb (77.111 kg)  07/22/13 175 lb (79.379 kg)  07/16/13 174 lb 6.1 oz (79.1 kg)  07/16/13 174 lb 6.1 oz (79.1 kg)  07/15/13 170 lb (77.111 kg)  07/15/13 170 lb (77.111 kg)  07/07/13 174 lb 1.9 oz (78.98 kg)  06/29/13 170 lb (77.111 kg)  06/23/13 171 lb (77.565 kg)  06/20/13 172 lb (78.019 kg)  06/11/13 173 lb 1 oz (78.5 kg)  05/14/13 176 lb (79.833 kg)  05/03/13 177 lb (80.287 kg)    Body mass index is 28.29 kg/(m^2). Patient meets criteria for overweight based on current BMI.   Current diet order is carb modified, patient is consuming approximately 50-100% of meals at this time. Labs and medications reviewed.   No nutrition interventions warranted at this time. If nutrition issues arise, please consult RD.    A. Jimmye Norman, RD, LDN Pager: [phone]

## 2013-08-02 NOTE — Progress Notes (Signed)
Subjective: Patient complains of cough, congestion and wheezing. She feels weak. No fever or chills. Objective: Vital signs in last 24 hours: Temp:  [97.8 F (36.6 C)-98.6 F (37 C)] 98.4 F (36.9 C) (02/02 0702) Pulse Rate:  [47-58] 50 (02/02 0702) Resp:  [18-20] 20 (02/02 0702) BP: (146-177)/(62-72) 146/72 mmHg (02/02 0702) SpO2:  [93 %-96 %] 96 % (02/02 0702) Weight change:  Last BM Date: 08/01/13  Intake/Output from previous day: 02/01 0701 - 02/02 0700 In: 2223 [P.O.:960; I.V.:1213; IV Piggyback:50] Out: -   PHYSICAL EXAM General appearance: alert, fatigued and no distress Resp: diminished breath sounds bilaterally and rhonchi bilaterally Cardio: S1 and S2 heard bradycardic GI: soft, non-tender; bowel sounds normal; no masses,  no organomegaly Extremities: extremities normal, atraumatic, no cyanosis or edema  Lab Results:    @labtest @ [facility] No results found for this basename: PHART, PCO2, PO2ART, TCO2, HCO3,  in the last 72 hours CULTURES No results found for this or any previous visit (from the past 240 hour(s)). Studies/Results: No results found.  Medications: I have reviewed the patient's current medications.  Assesment: Principal Problem:   Hyponatremia Active Problems:   Essential hypertension, benign   GERD   Paroxysmal atrial fibrillation   Type 2 diabetes mellitus   Generalized weakness   Cough bradycardia   Plan: Medications reviewed Continue tamiflu Continue current treatment Nebulizer treatment  LOS: 5 days   , 08/02/2013, 7:50 AM

## 2013-08-02 NOTE — Progress Notes (Signed)
Heart rate continues to drop into the 30's at night. Held lopressor today but heart rate continues to be brady. Pt dizzy at times. BP stable.

## 2013-08-03 DIAGNOSIS — J09X2 Influenza due to identified novel influenza A virus with other respiratory manifestations: Secondary | ICD-10-CM | POA: Diagnosis present

## 2013-08-03 LAB — CBC
HCT: 34.9 % — ABNORMAL LOW (ref 36.0–46.0)
Hemoglobin: 12.2 g/dL (ref 12.0–15.0)
MCH: 29.7 pg (ref 26.0–34.0)
MCHC: 35 g/dL (ref 30.0–36.0)
MCV: 84.9 fL (ref 78.0–100.0)
Platelets: 196 10*3/uL (ref 150–400)
RBC: 4.11 MIL/uL (ref 3.87–5.11)
RDW: 12.5 % (ref 11.5–15.5)
WBC: 7.8 10*3/uL (ref 4.0–10.5)

## 2013-08-03 LAB — BASIC METABOLIC PANEL
BUN: 9 mg/dL (ref 6–23)
CO2: 28 mEq/L (ref 19–32)
Calcium: 9.1 mg/dL (ref 8.4–10.5)
Chloride: 101 mEq/L (ref 96–112)
Creatinine, Ser: 0.85 mg/dL (ref 0.50–1.10)
GFR calc Af Amer: 79 mL/min — ABNORMAL LOW (ref >90)
GFR, EST NON AFRICAN AMERICAN: 68 mL/min — AB (ref >90)
Glucose, Bld: 107 mg/dL — ABNORMAL HIGH (ref 70–99)
Potassium: 3.9 mEq/L (ref 3.7–5.3)
SODIUM: 139 meq/L (ref 137–147)

## 2013-08-03 LAB — GLUCOSE, CAPILLARY: GLUCOSE-CAPILLARY: 117 mg/dL — AB (ref 70–99)

## 2013-08-03 MED ORDER — HYDROCODONE-HOMATROPINE 5-1.5 MG/5ML PO SYRP: 5.0000 mL | ORAL_SOLUTION | ORAL | Status: DC | PRN

## 2013-08-03 NOTE — Progress Notes (Signed)
She says she feels better and wants to go home. I will plan to discharge her at this point. Please see discharge summary for details

## 2013-08-06 NOTE — Discharge Summary (Signed)
Physician Discharge Summary  Patient ID: Erika Lee MRN: [id number] DOB/AGE: 70/25/1947 70 y.o. Primary Care Physician:, L, MD Admit date: 07/28/2013 Discharge date: 08/06/2013    Discharge Diagnoses:   Principal Problem:   Influenza due to identified novel influenza A virus with other respiratory manifestations Active Problems:   Essential hypertension, benign   GERD   Paroxysmal atrial fibrillation   Type 2 diabetes mellitus   Hyponatremia   Generalized weakness   Cough     Medication List    STOP taking these medications       guaiFENesin-codeine 100-10 MG/5ML syrup  Commonly known as:  ROBITUSSIN AC      TAKE these medications       apixaban 5 MG Tabs tablet  Commonly known as:  ELIQUIS  Take 1 tablet (5 mg total) by mouth 2 (two) times daily.     esomeprazole 40 MG capsule  Commonly known as:  NEXIUM  Take 1 capsule (40 mg total) by mouth 2 (two) times daily.     hydrochlorothiazide 25 MG tablet  Commonly known as:  HYDRODIURIL  Take 25 mg by mouth daily.     HYDROcodone-acetaminophen 7.5-325 MG per tablet  Commonly known as:  NORCO  Take 1 tablet by mouth every 6 (six) hours as needed for moderate pain.     HYDROcodone-homatropine 5-1.5 MG/5ML syrup  Commonly known as:  HYCODAN  Take 5 mLs by mouth every 4 (four) hours as needed for cough.     lisinopril 10 MG tablet  Commonly known as:  PRINIVIL,ZESTRIL  Take 10 mg by mouth daily.     loratadine 10 MG tablet  Commonly known as:  CLARITIN  Take 10 mg by mouth daily as needed for allergies.     metFORMIN 500 MG tablet  Commonly known as:  GLUCOPHAGE  Take 1 tablet (500 mg total) by mouth daily.     metoprolol succinate 25 MG 24 hr tablet  Commonly known as:  TOPROL XL  Take 0.5 tablets (12.5 mg total) by mouth daily.     Potassium Gluconate 550 MG Tabs  Take 1 tablet (550 mg total) by mouth 2 (two) times daily.     simvastatin 40 MG tablet  Commonly known as:  ZOCOR  Take 40  mg by mouth at bedtime.        Discharged Condition: Improved   Consults: None  Significant Diagnostic Studies: Dg Chest 2 View  07/28/2013   CLINICAL DATA:  Cough, chest pain  EXAM: CHEST  2 VIEW  COMPARISON:  07/24/2013  FINDINGS: Mild cardiomegaly with similar vascular and interstitial prominence, nonspecific. No definite CHF or pneumonia. No effusion or pneumothorax. Trachea midline. Aortic atherosclerosis noted and degenerative changes of the spine.  IMPRESSION: Cardiomegaly without acute process.  Stable exam   Electronically Signed   By: Daryll Brod M.D.   On: 07/28/2013 23:34   Dg Chest 2 View  07/24/2013   CLINICAL DATA:  Dizziness and weakness.  EXAM: CHEST  2 VIEW  COMPARISON:  07/22/2013  FINDINGS: The heart size and mediastinal contours are within normal limits. Both lungs are clear. The visualized skeletal structures are unremarkable.  IMPRESSION: Stable exam.  No active cardiopulmonary disease.   Electronically Signed   By: Earle Gell M.D.   On: 07/24/2013 18:10   Dg Chest 2 View  07/22/2013   CLINICAL DATA:  Dysphagia.  Cough.  Short of breath  EXAM: CHEST  2 VIEW  COMPARISON:  06/20/2013  FINDINGS:  Severe cardiomegaly. Hyperaeration. No pleural effusion. No pneumothorax. No lung mass or consolidation. Pleural thickening at the lung apices is stable. Minimal linear atelectasis at the lung bases.  IMPRESSION: Cardiomegaly without edema.  Minimal basilar atelectasis   Electronically Signed   By: Maryclare Bean M.D.   On: 07/22/2013 16:12   Ct Chest Wo Contrast  07/26/2013   CLINICAL DATA:  Chronic cough.  EXAM: CT CHEST WITHOUT CONTRAST  TECHNIQUE: Multidetector CT imaging of the chest was performed following the standard protocol without IV contrast.  COMPARISON:  None.  FINDINGS: Both lungs are clear. No evidence of pulmonary infiltrate or mass. No central endobronchial lesion identified. No evidence of pleural or pericardial effusion.  No hilar or mediastinal masses are identified  in this noncontrast study. No lymphadenopathy identified elsewhere within the thorax. Mild multinodular goiter noted. No suspicious bone lesions identified.  IMPRESSION: No active lung disease.  Mild goiter.   Electronically Signed   By: Earle Gell M.D.   On: 07/26/2013 15:42    Lab Results: Basic Metabolic Panel: No results found for this basename: NA, K, CL, CO2, GLUCOSE, BUN, CREATININE, CALCIUM, MG, PHOS,  in the last 72 hours Liver Function Tests: No results found for this basename: AST, ALT, ALKPHOS, BILITOT, PROT, ALBUMIN,  in the last 72 hours   CBC: No results found for this basename: WBC, NEUTROABS, HGB, HCT, MCV, PLT,  in the last 72 hours  No results found for this or any previous visit (from the past 240 hour(s)).   Hospital Course: This is a 68 year old can do the emergency department because of weakness cough and congestion. She had an ablation procedure and she feels it is somehow related to that. She had been in atrial fibrillation and her heart rate has been better since the ablation. Exam on admission showed that she appear to be acutely sick. Her chest was with rhonchi bilaterally. Heart was regular. She was found to have influenza A. She was treated symptomatically given IV fluids et Ronney Asters and he improved. She was still coughing but much less  Discharge Exam: Blood pressure 164/79, pulse 54, temperature 98 F (36.7 C), temperature source Oral, resp. rate 20, height 5\' 5"  (1.651 m), weight 77.111 kg (170 lb), SpO2 96.00%. She is awake and alert. Her chest is clear. Her heart is regular. She still has a cough.  Disposition: Home      Discharge Orders   Future Orders Complete By Expires   Discharge patient  As directed         Signed: , L   08/06/2013, 7:50 AM

## 2013-08-30 ENCOUNTER — Ambulatory Visit: Payer: Medicare Other | Admitting: Nurse Practitioner

## 2013-10-08 ENCOUNTER — Encounter: Payer: Self-pay | Admitting: Internal Medicine

## 2013-10-08 ENCOUNTER — Ambulatory Visit (INDEPENDENT_AMBULATORY_CARE_PROVIDER_SITE_OTHER): Payer: Medicare Other | Admitting: Internal Medicine

## 2013-10-08 VITALS — BP 126/79 | HR 53 | Ht 65.0 in | Wt 171.0 lb

## 2013-10-08 DIAGNOSIS — I4891 Unspecified atrial fibrillation: Secondary | ICD-10-CM

## 2013-10-08 DIAGNOSIS — I480 Paroxysmal atrial fibrillation: Secondary | ICD-10-CM

## 2013-10-08 DIAGNOSIS — I10 Essential (primary) hypertension: Secondary | ICD-10-CM

## 2013-10-08 NOTE — Patient Instructions (Signed)
Your physician recommends that you schedule a follow-up appointment in 3 months with Dr Allred    

## 2013-10-08 NOTE — Progress Notes (Signed)
PCP: Alonza Bogus, MD Primary Cardiologist:  Dr Elige Ko Erika Lee is a 68 y.o. female who presents today for routine electrophysiology followup.  Since last being seen in our clinic, the patient reports doing very well.  She had influenza/ pneumonia as the cause for her cough in January.  This has completely resolved.  She is presently quite pleased with the results of her ablation.  She has had no afib.  She denies procedure related complications.  Today, she denies symptoms of palpitations, chest pain, shortness of breath,  lower extremity edema, dizziness, presyncope, or syncope.  The patient is otherwise without complaint today.   Past Medical History  Diagnosis Date  . Hyperlipidemia   . Multinodular goiter   . Paroxysmal atrial fibrillation     s/p PVI by [name] 07/16/2013  . GERD (gastroesophageal reflux disease)   . Essential hypertension, benign   . Palpitations   . Diabetes mellitus     Borderline  . Arthritis   . Atrial flutter     s/p CTI by [name] 07/16/2013   Past Surgical History  Procedure Laterality Date  . Biopsy thyroid    . Tonsillectomy    . Colonoscopy  09/10/2011    Procedure: COLONOSCOPY;  Surgeon: Jamesetta So, MD;  Location: [facility];  Service: Gastroenterology;  Laterality: N/A;  . Lesion removal N/A 05/03/2013    Procedure: EXCISION CYST, BACK;  Surgeon: Jamesetta So, MD;  Location: [facility] ORS;  Service: General;  Laterality: N/A;  . Tee without cardioversion N/A 07/15/2013    Procedure: TRANSESOPHAGEAL ECHOCARDIOGRAM (TEE);  Surgeon: Lelon Perla, MD;  Location[facility] ENDOSCOPY;  Service: Cardiovascular;  Laterality: N/A;  . Ablation  07/16/2013    PVI and CTI ablation by [name]    Current Outpatient Prescriptions  Medication Sig Dispense Refill  . apixaban (ELIQUIS) 5 MG TABS tablet Take 1 tablet (5 mg total) by mouth 2 (two) times daily.  60 tablet  12  . esomeprazole (NEXIUM) 40 MG capsule Take 1 capsule (40 mg total) by mouth 2  (two) times daily.  60 capsule  3  . hydrochlorothiazide 25 MG tablet Take 25 mg by mouth daily.        Marland Kitchen HYDROcodone-acetaminophen (NORCO) 7.5-325 MG per tablet Take 1 tablet by mouth every 6 (six) hours as needed for moderate pain.      Marland Kitchen lisinopril (PRINIVIL,ZESTRIL) 10 MG tablet Take 10 mg by mouth daily.        Marland Kitchen loratadine (CLARITIN) 10 MG tablet Take 10 mg by mouth daily as needed for allergies.      . metFORMIN (GLUCOPHAGE) 500 MG tablet Take 1 tablet (500 mg total) by mouth daily.      . metoprolol succinate (TOPROL XL) 25 MG 24 hr tablet Take 0.5 tablets (12.5 mg total) by mouth daily.  60 tablet  3  . Potassium Gluconate 550 MG TABS Take 1 tablet (550 mg total) by mouth 2 (two) times daily.  30 each  0  . simvastatin (ZOCOR) 40 MG tablet Take 40 mg by mouth at bedtime.         No current facility-administered medications for this visit.    Physical Exam: Filed Vitals:   10/08/13 1531  BP: 126/79  Pulse: 53  Height: 5\' 5"  (1.651 m)  Weight: 171 lb (77.565 kg)    GEN- The patient is well appearing, alert and oriented x 3 today.   Head- normocephalic, atraumatic Eyes-  Sclera clear, conjunctiva pink Ears- hearing intact Oropharynx- clear Lungs- Clear to ausculation bilaterally, normal work of breathing Heart- Regular rate and rhythm, no murmurs, rubs or gallops, PMI not laterally displaced GI- soft, NT, ND, + BS Extremities- no clubbing, cyanosis, or edema  ekg today reveals sinus rhythm 51 bpm, otherwise normal ekg  Assessment and Plan:  1. afib Doing great s/p ablation, without recurrence off of AAD therapy chads2vasc score is at least 70 (age, female, dm, htn).  She should therefore continue long term anticoagulation  2. htn Stable No change required today  Return in 3 months Consider stopping toprol at that time

## 2013-10-13 ENCOUNTER — Ambulatory Visit: Payer: Medicare Other | Admitting: Internal Medicine

## 2013-10-25 ENCOUNTER — Encounter (HOSPITAL_COMMUNITY): Payer: Self-pay | Admitting: Emergency Medicine

## 2013-10-25 ENCOUNTER — Emergency Department (HOSPITAL_COMMUNITY)
Admission: EM | Admit: 2013-10-25 | Discharge: 2013-10-25 | Disposition: A | Discharge status: Home / Self Care | Payer: Medicare Other | Attending: Emergency Medicine | Admitting: Emergency Medicine

## 2013-10-25 ENCOUNTER — Emergency Department (HOSPITAL_COMMUNITY): Payer: Medicare Other

## 2013-10-25 DIAGNOSIS — R6889 Other general symptoms and signs: Secondary | ICD-10-CM | POA: Insufficient documentation

## 2013-10-25 DIAGNOSIS — Z79899 Other long term (current) drug therapy: Secondary | ICD-10-CM | POA: Insufficient documentation

## 2013-10-25 DIAGNOSIS — E785 Hyperlipidemia, unspecified: Secondary | ICD-10-CM | POA: Insufficient documentation

## 2013-10-25 DIAGNOSIS — Z87891 Personal history of nicotine dependence: Secondary | ICD-10-CM | POA: Insufficient documentation

## 2013-10-25 DIAGNOSIS — K529 Noninfective gastroenteritis and colitis, unspecified: Secondary | ICD-10-CM

## 2013-10-25 DIAGNOSIS — K5289 Other specified noninfective gastroenteritis and colitis: Secondary | ICD-10-CM | POA: Insufficient documentation

## 2013-10-25 DIAGNOSIS — I10 Essential (primary) hypertension: Secondary | ICD-10-CM | POA: Insufficient documentation

## 2013-10-25 DIAGNOSIS — R58 Hemorrhage, not elsewhere classified: Secondary | ICD-10-CM | POA: Insufficient documentation

## 2013-10-25 DIAGNOSIS — D689 Coagulation defect, unspecified: Secondary | ICD-10-CM | POA: Insufficient documentation

## 2013-10-25 DIAGNOSIS — Z7902 Long term (current) use of antithrombotics/antiplatelets: Secondary | ICD-10-CM | POA: Insufficient documentation

## 2013-10-25 DIAGNOSIS — Z8739 Personal history of other diseases of the musculoskeletal system and connective tissue: Secondary | ICD-10-CM | POA: Insufficient documentation

## 2013-10-25 DIAGNOSIS — K219 Gastro-esophageal reflux disease without esophagitis: Secondary | ICD-10-CM | POA: Insufficient documentation

## 2013-10-25 DIAGNOSIS — Z91040 Latex allergy status: Secondary | ICD-10-CM | POA: Insufficient documentation

## 2013-10-25 LAB — CBC WITH DIFFERENTIAL/PLATELET
Basophils Absolute: 0 10*3/uL (ref 0.0–0.1)
Basophils Relative: 0 % (ref 0–1)
Eosinophils Absolute: 0.1 10*3/uL (ref 0.0–0.7)
Eosinophils Relative: 1 % (ref 0–5)
HCT: 43.9 % (ref 36.0–46.0)
Hemoglobin: 15.8 g/dL — ABNORMAL HIGH (ref 12.0–15.0)
Lymphocytes Relative: 8 % — ABNORMAL LOW (ref 12–46)
Lymphs Abs: 1.3 10*3/uL (ref 0.7–4.0)
MCH: 29.8 pg (ref 26.0–34.0)
MCHC: 36 g/dL (ref 30.0–36.0)
MCV: 82.7 fL (ref 78.0–100.0)
Monocytes Absolute: 1.2 10*3/uL — ABNORMAL HIGH (ref 0.1–1.0)
Monocytes Relative: 7 % (ref 3–12)
Neutro Abs: 14.4 10*3/uL — ABNORMAL HIGH (ref 1.7–7.7)
Neutrophils Relative %: 84 % — ABNORMAL HIGH (ref 43–77)
Platelets: 192 10*3/uL (ref 150–400)
RBC: 5.31 MIL/uL — ABNORMAL HIGH (ref 3.87–5.11)
RDW: 12.4 % (ref 11.5–15.5)
WBC: 17 10*3/uL — ABNORMAL HIGH (ref 4.0–10.5)

## 2013-10-25 LAB — COMPREHENSIVE METABOLIC PANEL
ALT: 15 U/L (ref 0–35)
AST: 16 U/L (ref 0–37)
Albumin: 4.1 g/dL (ref 3.5–5.2)
Alkaline Phosphatase: 95 U/L (ref 39–117)
BUN: 13 mg/dL (ref 6–23)
CALCIUM: 10.4 mg/dL (ref 8.4–10.5)
CO2: 26 meq/L (ref 19–32)
Chloride: 95 mEq/L — ABNORMAL LOW (ref 96–112)
Creatinine, Ser: 0.72 mg/dL (ref 0.50–1.10)
GFR, EST NON AFRICAN AMERICAN: 86 mL/min — AB (ref >90)
GLUCOSE: 192 mg/dL — AB (ref 70–99)
Potassium: 3.8 mEq/L (ref 3.7–5.3)
Sodium: 137 mEq/L (ref 137–147)
TOTAL PROTEIN: 7.2 g/dL (ref 6.0–8.3)
Total Bilirubin: 0.2 mg/dL — ABNORMAL LOW (ref 0.3–1.2)

## 2013-10-25 LAB — LIPASE, BLOOD: LIPASE: 56 U/L (ref 11–59)

## 2013-10-25 MED ORDER — SODIUM CHLORIDE 0.9 % IV SOLN: INTRAVENOUS | Status: DC

## 2013-10-25 MED ORDER — IOHEXOL 300 MG/ML  SOLN: 100.0000 mL | Freq: Once | INTRAMUSCULAR | Status: DC | PRN

## 2013-10-25 MED ORDER — LOPERAMIDE HCL 2 MG PO TABS: 2.0000 mg | ORAL_TABLET | Freq: Four times a day (QID) | ORAL | Status: DC | PRN

## 2013-10-25 MED ORDER — SODIUM CHLORIDE 0.9 % IV BOLUS (SEPSIS)
500.0000 mL | Freq: Once | INTRAVENOUS | Status: AC
  Administered 2013-10-25: 500 mL via INTRAVENOUS

## 2013-10-25 MED ORDER — IOHEXOL 300 MG/ML  SOLN
50.0000 mL | Freq: Once | INTRAMUSCULAR | Status: AC | PRN
  Administered 2013-10-25: 50 mL via ORAL

## 2013-10-25 MED ORDER — ONDANSETRON 4 MG PO TBDP: 4.0000 mg | ORAL_TABLET | Freq: Three times a day (TID) | ORAL | Status: DC | PRN

## 2013-10-25 MED ORDER — IOHEXOL 300 MG/ML  SOLN
100.0000 mL | Freq: Once | INTRAMUSCULAR | Status: AC | PRN
  Administered 2013-10-25: 100 mL via INTRAVENOUS

## 2013-10-25 MED ORDER — ONDANSETRON HCL 4 MG/2ML IJ SOLN
4.0000 mg | Freq: Once | INTRAMUSCULAR | Status: AC
  Administered 2013-10-25: 4 mg via INTRAVENOUS
  Filled 2013-10-25: qty 2

## 2013-10-25 MED ORDER — HYDROMORPHONE HCL PF 1 MG/ML IJ SOLN
1.0000 mg | Freq: Once | INTRAMUSCULAR | Status: AC
  Administered 2013-10-25: 1 mg via INTRAVENOUS
  Filled 2013-10-25: qty 1

## 2013-10-25 NOTE — ED Provider Notes (Signed)
CSN: [id number]     Arrival date & time 10/25/13  [id number] History   This chart was scribed for Mervin Kung, MD by Allena Earing, ED Scribe. This patient was seen in room APA06/APA06 and the patient's care was started at 0943 .    Chief Complaint  Patient presents with  . Abdominal Pain     Patient is a 68 y.o. female presenting with abdominal pain. The history is provided by the patient. No language interpreter was used.  Abdominal Pain Pain location:  Epigastric Pain quality: gnawing   Pain radiates to:  Does not radiate Pain severity:  Moderate Onset quality:  Sudden Duration:  4 hours Timing:  Rare Progression:  Unchanged Chronicity:  Recurrent Context: not sick contacts   Relieved by:  Nothing Worsened by:  Nothing tried Ineffective treatments:  None tried Associated symptoms: chills, diarrhea, fever, nausea and vomiting   Associated symptoms: no chest pain, no cough, no dysuria and no shortness of breath   Diarrhea:    Quality:  Unable to specify   Number of occurrences:  Multipple   Severity:  Moderate   Duration:  4 hours   Timing:  Rare   Progression:  Unchanged Vomiting:    Quality:  Unable to specify   Number of occurrences:  Multiple   Severity:  Moderate   Duration:  4 hours   Timing:  Rare   Progression:  Unchanged   HPI Comments: Erika Lee is a 68 y.o. female who presents to the Emergency Department complaining of constant  gnawing abdominal pain that began around 6:00 am, she felt fine yesterday and reports a current pain of 5/10. Pt states that the pain was never worse than a 5/10.She reports multiple episodes of vomiting and diarrhea that began with onset of abdominal pain. Pt states that she has experienced more vomiting than diarrhea and she has not been able to keep any "water down".Immediately after vomiting she reports having some chills.   Past Medical History  Diagnosis Date  . Hyperlipidemia   . Multinodular goiter   . Paroxysmal  atrial fibrillation     s/p PVI by [name] 07/16/2013  . GERD (gastroesophageal reflux disease)   . Essential hypertension, benign   . Palpitations   . Diabetes mellitus     Borderline  . Arthritis   . Atrial flutter     s/p CTI by [name] 07/16/2013   Past Surgical History  Procedure Laterality Date  . Biopsy thyroid    . Tonsillectomy    . Colonoscopy  09/10/2011    Procedure: COLONOSCOPY;  Surgeon: Jamesetta So, MD;  Location: [facility];  Service: Gastroenterology;  Laterality: N/A;  . Lesion removal N/A 05/03/2013    Procedure: EXCISION CYST, BACK;  Surgeon: Jamesetta So, MD;  Location: [facility] ORS;  Service: General;  Laterality: N/A;  . Tee without cardioversion N/A 07/15/2013    Procedure: TRANSESOPHAGEAL ECHOCARDIOGRAM (TEE);  Surgeon: Lelon Perla, MD;  Location: [facility] ENDOSCOPY;  Service: Cardiovascular;  Laterality: N/A;  . Ablation  07/16/2013    PVI and CTI ablation by [name]   Family History  Problem Relation Age of Onset  . Stroke Mother    History  Substance Use Topics  . Smoking status: Former Smoker    Types: Cigarettes    Quit date: 11/29/1993  . Smokeless tobacco: Never Used  . Alcohol Use: No   OB History   Grav Para Term Preterm Abortions TAB SAB  Ect Mult Living                 Review of Systems  Constitutional: Positive for fever and chills.  HENT: Positive for sneezing. Negative for congestion and rhinorrhea.   Eyes: Negative for visual disturbance.  Respiratory: Negative for cough and shortness of breath.   Cardiovascular: Positive for palpitations. Negative for chest pain.  Gastrointestinal: Positive for nausea, vomiting, abdominal pain and diarrhea.  Genitourinary: Negative for dysuria.  Musculoskeletal: Negative for myalgias.  Skin: Negative for rash.  Neurological: Negative for headaches.  Hematological: Bruises/bleeds easily.  Psychiatric/Behavioral: Negative for confusion.      Allergies  Clindamycin/lincomycin;  Doxycycline; Adhesive; Latex; and Sulfonamide derivatives  Home Medications   Prior to Admission medications   Medication Sig Start Date End Date Taking? Authorizing Provider  apixaban (ELIQUIS) 5 MG TABS tablet Take 1 tablet (5 mg total) by mouth 2 (two) times daily. 06/13/13  Yes Alonza Bogus, MD  esomeprazole (NEXIUM) 40 MG capsule Take 1 capsule (40 mg total) by mouth 2 (two) times daily. 07/22/13  Yes Thompson Grayer, MD  hydrochlorothiazide 25 MG tablet Take 25 mg by mouth daily.     Yes Historical Provider, MD  HYDROcodone-acetaminophen (NORCO) 7.5-325 MG per tablet Take 1 tablet by mouth every 6 (six) hours as needed for moderate pain.   Yes Historical Provider, MD  lisinopril (PRINIVIL,ZESTRIL) 10 MG tablet Take 10 mg by mouth daily.     Yes Historical Provider, MD  loratadine (CLARITIN) 10 MG tablet Take 10 mg by mouth daily as needed for allergies.   Yes Historical Provider, MD  metFORMIN (GLUCOPHAGE) 500 MG tablet Take 1 tablet (500 mg total) by mouth daily. 07/17/13  Yes Tarri Fuller, PA-C  metoprolol succinate (TOPROL XL) 25 MG 24 hr tablet Take 0.5 tablets (12.5 mg total) by mouth daily. 05/25/13  Yes Satira Sark, MD  Potassium Gluconate 550 MG TABS Take 1 tablet (550 mg total) by mouth 2 (two) times daily. 06/13/13  Yes Alonza Bogus, MD  simvastatin (ZOCOR) 40 MG tablet Take 40 mg by mouth at bedtime.     Yes Historical Provider, MD   BP 135/77  Pulse 64  Temp(Src) 98.1 F (36.7 C)  Resp 18  Ht 5\' 5"  (1.651 m)  Wt 170 lb (77.111 kg)  BMI 28.29 kg/m2  SpO2 97% Physical Exam  Constitutional: She is oriented to person, place, and time. She appears well-developed and well-nourished.  HENT:  Head: Normocephalic and atraumatic.  Mouth/Throat: Oropharynx is clear and moist.  Neck: Normal range of motion.  Cardiovascular: Normal rate, regular rhythm and normal heart sounds.   Pulmonary/Chest: Effort normal and breath sounds normal.  Abdominal: Soft. There is no  tenderness.  Epigastric Decreased bowel sounds, but they are present   Musculoskeletal: Normal range of motion.  Neurological: She is alert and oriented to person, place, and time. No cranial nerve deficit. Coordination normal.    ED Course  Procedures (including critical care time)  DIAGNOSTIC STUDIES: Oxygen Saturation is 97% on RA, normal by my interpretation.    COORDINATION OF CARE:   9:47 AM-Discussed treatment plan which includes labs, IV, CT with pt at bedside and pt agreed to plan.   Results for orders placed during the hospital encounter of 10/25/13  COMPREHENSIVE METABOLIC PANEL      Result Value Ref Range   Sodium 137  137 - 147 mEq/L   Potassium 3.8  3.7 - 5.3 mEq/L   Chloride 95 (*)  96 - 112 mEq/L   CO2 26  19 - 32 mEq/L   Glucose, Bld 192 (*) 70 - 99 mg/dL   BUN 13  6 - 23 mg/dL   Creatinine, Ser 0.72  0.50 - 1.10 mg/dL   Calcium 10.4  8.4 - 10.5 mg/dL   Total Protein 7.2  6.0 - 8.3 g/dL   Albumin 4.1  3.5 - 5.2 g/dL   AST 16  0 - 37 U/L   ALT 15  0 - 35 U/L   Alkaline Phosphatase 95  39 - 117 U/L   Total Bilirubin 0.2 (*) 0.3 - 1.2 mg/dL   GFR calc non Af Amer 86 (*) >90 mL/min   GFR calc Af Amer >90  >90 mL/min  LIPASE, BLOOD      Result Value Ref Range   Lipase 56  11 - 59 U/L  CBC WITH DIFFERENTIAL      Result Value Ref Range   WBC 17.0 (*) 4.0 - 10.5 K/uL   RBC 5.31 (*) 3.87 - 5.11 MIL/uL   Hemoglobin 15.8 (*) 12.0 - 15.0 g/dL   HCT 43.9  36.0 - 46.0 %   MCV 82.7  78.0 - 100.0 fL   MCH 29.8  26.0 - 34.0 pg   MCHC 36.0  30.0 - 36.0 g/dL   RDW 12.4  11.5 - 15.5 %   Platelets 192  150 - 400 K/uL   Neutrophils Relative % 84 (*) 43 - 77 %   Neutro Abs 14.4 (*) 1.7 - 7.7 K/uL   Lymphocytes Relative 8 (*) 12 - 46 %   Lymphs Abs 1.3  0.7 - 4.0 K/uL   Monocytes Relative 7  3 - 12 %   Monocytes Absolute 1.2 (*) 0.1 - 1.0 K/uL   Eosinophils Relative 1  0 - 5 %   Eosinophils Absolute 0.1  0.0 - 0.7 K/uL   Basophils Relative 0  0 - 1 %   Basophils  Absolute 0.0  0.0 - 0.1 K/uL   No results found.   Medications  0.9 %  sodium chloride infusion (not administered)  iohexol (OMNIPAQUE) 300 MG/ML solution 100 mL (not administered)  sodium chloride 0.9 % bolus 500 mL (500 mLs Intravenous New Bag/Given 10/25/13 1021)  ondansetron (ZOFRAN) injection 4 mg (4 mg Intravenous Given 10/25/13 1021)  HYDROmorphone (DILAUDID) injection 1 mg (1 mg Intravenous Given 10/25/13 1021)  iohexol (OMNIPAQUE) 300 MG/ML solution 100 mL (100 mLs Intravenous Contrast Given 10/25/13 1129)  iohexol (OMNIPAQUE) 300 MG/ML solution 50 mL (50 mLs Oral Contrast Given 10/25/13 1029)      Imaging Review Ct Abdomen Pelvis W Contrast  10/25/2013   CLINICAL DATA:  Epigastric and abdominal pain with nausea, vomiting, diarrhea.  EXAM: CT ABDOMEN AND PELVIS WITH CONTRAST  TECHNIQUE: Multidetector CT imaging of the abdomen and pelvis was performed using the standard protocol following bolus administration of intravenous contrast.  CONTRAST:  49mL OMNIPAQUE IOHEXOL 300 MG/ML SOLN, 164mL OMNIPAQUE IOHEXOL 300 MG/ML SOLN  COMPARISON:  None available.  FINDINGS: Subsegmental atelectasis seen dependently within the visualized lung bases.  The liver demonstrates a normal contrast enhanced appearance without evidence of focal intrahepatic lesion. Gallbladder is contracted but otherwise normal. No biliary ductal dilatation.  1.1 cm hypodense lesions seen within the anterior splenic parenchyma (series 2, image 24), indeterminate. Additional subcentimeter hypodensity seen more posteriorly within the spleen (series 2, image 24), also indeterminate. Findings are of doubtful clinical significance. Spleen is otherwise unremarkable. The adrenal glands and  pancreas demonstrate a normal contrast enhanced appearance.  The kidneys are equal in size with symmetric enhancement. No nephrolithiasis, hydronephrosis, or focal enhancing renal mass.  Stomach is within normal limits. No significant hiatal hernia.  Small bowel demonstrates a normal appearance. No evidence of bowel obstruction. On no abnormal bowel wall thickening, mucosal enhancement, or inflammatory fat stranding seen about the bowels. The appendix is not definitely visualized, however, no inflammatory changes are seen within the right lower quadrant or about the cecum to suggest acute appendicitis.  Bladder is well distended and normal in appearance. Heterogeneous calcifications within the posterior uterine bilaterally likely represent fibroids. Uterus is otherwise unremarkable. Ovaries within normal limits.  No free air or fluid. No enlarged intra-abdominal or pelvic lymph nodes. Moderate aortobiiliac atherosclerotic calcifications noted.  No acute osseous abnormality. Prominent degenerative endplate changes seen about the L4-5 intervertebral disc space. No worrisome lytic or blastic osseous lesions.  IMPRESSION: 1. No CT evidence of acute intra-abdominal or pelvic process. 2. Fibroid uterus.   Electronically Signed   By: Jeannine Boga M.D.   On: 10/25/2013 12:00     EKG Interpretation   Date/Time:  Monday October 25 2013 10:08:24 EDT Ventricular Rate:  55 PR Interval:  164 QRS Duration: 102 QT Interval:  458 QTC Calculation: 438 R Axis:   73 Text Interpretation:  Sinus bradycardia Otherwise normal ECG When compared  with ECG of 28-Jul-2013 22:01, No significant change was found Confirmed  by   MD,  ([id number]) on 10/25/2013 10:25:34 AM      MDM   Final diagnoses:  Gastroenteritis   I personally performed the services described in this documentation, which was scribed in my presence. The recorded information has been reviewed and is accurate.   Symptoms consistent with a viral gastroenteritis. CT scan of the abdomen and pelvis without any significant findings. Patient's improved here. CT scans prickly showed no evidence of bowel structure in or colitis or enteritis. Will treat patient symptomatically she'll return  for new or worse symptoms.   Mervin Kung, MD 10/25/13 [phone]

## 2013-10-25 NOTE — ED Notes (Signed)
Pt c/o abd pain/n/v/d/chills since 0600

## 2013-10-25 NOTE — Discharge Instructions (Signed)
Diet for Diarrhea, Adult Frequent, runny stools (diarrhea) may be caused or worsened by food or drink. Diarrhea may be relieved by changing your diet. Since diarrhea can last up to 7 days, it is easy for you to lose too much fluid from the body and become dehydrated. Fluids that are lost need to be replaced. Along with a modified diet, make sure you drink enough fluids to keep your urine clear or pale yellow. DIET INSTRUCTIONS  Ensure adequate fluid intake (hydration): have 1 cup (8 oz) of fluid for each diarrhea episode. Avoid fluids that contain simple sugars or sports drinks, fruit juices, whole milk products, and sodas. Your urine should be clear or pale yellow if you are drinking enough fluids. Hydrate with an oral rehydration solution that you can purchase at pharmacies, retail stores, and online. You can prepare an oral rehydration solution at home by mixing the following ingredients together:    tsp table salt.   tsp baking soda.   tsp salt substitute containing potassium chloride.  1  tablespoons sugar.  1 L (34 oz) of water.  Certain foods and beverages may increase the speed at which food moves through the gastrointestinal (GI) tract. These foods and beverages should be avoided and include:  Caffeinated and alcoholic beverages.  High-fiber foods, such as raw fruits and vegetables, nuts, seeds, and whole grain breads and cereals.  Foods and beverages sweetened with sugar alcohols, such as xylitol, sorbitol, and mannitol.  Some foods may be well tolerated and may help thicken stool including:  Starchy foods, such as rice, toast, pasta, low-sugar cereal, oatmeal, grits, baked potatoes, crackers, and bagels.   Bananas.   Applesauce.  Add probiotic-rich foods to help increase healthy bacteria in the GI tract, such as yogurt and fermented milk products. RECOMMENDED FOODS AND BEVERAGES Starches Choose foods with less than 2 g of fiber per serving.  Recommended:  White,  [place], and pita breads, plain rolls, buns, bagels. Plain muffins, matzo. Soda, saltine, or graham crackers. Pretzels, melba toast, zwieback. Cooked cereals made with water: cornmeal, farina, cream cereals. Dry cereals: refined corn, wheat, rice. Potatoes prepared any way without skins, refined macaroni, spaghetti, noodles, refined rice.  Avoid:  Bread, rolls, or crackers made with whole wheat, multi-grains, rye, bran seeds, nuts, or coconut. Corn tortillas or taco shells. Cereals containing whole grains, multi-grains, bran, coconut, nuts, raisins. Cooked or dry oatmeal. Coarse wheat cereals, granola. Cereals advertised as "high-fiber." Potato skins. Whole grain pasta, wild or brown rice. Popcorn. Sweet potatoes, yams. Sweet rolls, doughnuts, waffles, pancakes, sweet breads. Vegetables  Recommended: Strained tomato and vegetable juices. Most well-cooked and canned vegetables without seeds. Fresh: Tender lettuce, cucumber without the skin, cabbage, spinach, bean sprouts.  Avoid: Fresh, cooked, or canned: Artichokes, baked beans, beet greens, broccoli, Brussels sprouts, corn, kale, legumes, peas, sweet potatoes. Cooked: Green or red cabbage, spinach. Avoid large servings of any vegetables because vegetables shrink when cooked, and they contain more fiber per serving than fresh vegetables. Fruit  Recommended: Cooked or canned: Apricots, applesauce, cantaloupe, cherries, fruit cocktail, grapefruit, grapes, kiwi, mandarin oranges, peaches, pears, plums, watermelon. Fresh: Apples without skin, ripe banana, grapes, cantaloupe, cherries, grapefruit, peaches, oranges, plums. Keep servings limited to  cup or 1 piece.  Avoid: Fresh: Apples with skin, apricots, mangoes, pears, raspberries, strawberries. Prune juice, stewed or dried prunes. Dried fruits, raisins, dates. Large servings of all fresh fruits. Protein  Recommended: Ground or well-cooked tender beef, ham, veal, lamb, pork, or poultry. Eggs. Fish,  oysters, shrimp,  lobster, other seafoods. Liver, organ meats.  Avoid: Tough, fibrous meats with gristle. Peanut butter, smooth or chunky. Cheese, nuts, seeds, legumes, dried peas, beans, lentils. Dairy  Recommended: Yogurt, lactose-free milk, kefir, drinkable yogurt, buttermilk, soy milk, or plain hard cheese.  Avoid: Milk, chocolate milk, beverages made with milk, such as milkshakes. Soups  Recommended: Bouillon, broth, or soups made from allowed foods. Any strained soup.  Avoid: Soups made from vegetables that are not allowed, cream or milk-based soups. Desserts and Sweets  Recommended: Sugar-free gelatin, sugar-free frozen ice pops made without sugar alcohol.  Avoid: Plain cakes and cookies, pie made with fruit, pudding, custard, cream pie. Gelatin, fruit, ice, sherbet, frozen ice pops. Ice cream, ice milk without nuts. Plain hard candy, honey, jelly, molasses, syrup, sugar, chocolate syrup, gumdrops, marshmallows. Fats and Oils  Recommended: Limit fats to less than 8 tsp per day.  Avoid: Seeds, nuts, olives, avocados. Margarine, butter, cream, mayonnaise, salad oils, plain salad dressings. Plain gravy, crisp bacon without rind. Beverages  Recommended: Water, decaffeinated teas, oral rehydration solutions, sugar-free beverages not sweetened with sugar alcohols.  Avoid: Fruit juices, caffeinated beverages (coffee, tea, soda), alcohol, sports drinks, or lemon-lime soda. Condiments  Recommended: Ketchup, mustard, horseradish, vinegar, cocoa powder. Spices in moderation: allspice, basil, bay leaves, celery powder or leaves, cinnamon, cumin powder, curry powder, ginger, mace, marjoram, onion or garlic powder, oregano, paprika, parsley flakes, ground pepper, rosemary, sage, savory, tarragon, thyme, turmeric.  Avoid: Coconut, honey. Document Released: 09/07/2003 Document Revised: 03/11/2012 Document Reviewed: 11/01/2011 [facility] Patient Information 2014 [place].  Take the  Zofran as needed for nausea. Take Imodium right ear as needed for diarrhea. Followup if not improved in 2 days. Return earlier if worse. As we discussed her CT scan today was negative. Suspect this is a viral gastroenteritis.

## 2014-01-06 ENCOUNTER — Ambulatory Visit (INDEPENDENT_AMBULATORY_CARE_PROVIDER_SITE_OTHER): Payer: Medicare Other | Admitting: Internal Medicine

## 2014-01-06 ENCOUNTER — Encounter: Payer: Self-pay | Admitting: Internal Medicine

## 2014-01-06 VITALS — BP 111/71 | HR 52 | Ht 65.0 in | Wt 167.0 lb

## 2014-01-06 DIAGNOSIS — I480 Paroxysmal atrial fibrillation: Secondary | ICD-10-CM

## 2014-01-06 DIAGNOSIS — I4891 Unspecified atrial fibrillation: Secondary | ICD-10-CM

## 2014-01-06 NOTE — Progress Notes (Signed)
PCP: Alonza Bogus, MD Primary Cardiologist:  Dr Elige Ko Erika Lee is a 68 y.o. female who presents today for routine electrophysiology followup.  Since her afib ablation, the patient reports doing very well, staying in normal rhythm.  She is not had any reoccurrence of atrial fibrillation. Reports overall she feels well. No bleeding issues with a blood thinner. We did discuss that she has a chads2 vasc score of 4 and will have to be on anticoagulation long-term according to guidelines, regardless of her rhythm.  Past Medical History  Diagnosis Date  . Hyperlipidemia   . Multinodular goiter   . Paroxysmal atrial fibrillation     s/p PVI by [name] 07/16/2013  . GERD (gastroesophageal reflux disease)   . Essential hypertension, benign   . Palpitations   . Diabetes mellitus     Borderline  . Arthritis   . Atrial flutter     s/p CTI by [name] 07/16/2013   Past Surgical History  Procedure Laterality Date  . Biopsy thyroid    . Tonsillectomy    . Colonoscopy  09/10/2011    Procedure: COLONOSCOPY;  Surgeon: Jamesetta So, MD;  Location: [facility];  Service: Gastroenterology;  Laterality: N/A;  . Lesion removal N/A 05/03/2013    Procedure: EXCISION CYST, BACK;  Surgeon: Jamesetta So, MD;  Location: [facility] ORS;  Service: General;  Laterality: N/A;  . Tee without cardioversion N/A 07/15/2013    Procedure: TRANSESOPHAGEAL ECHOCARDIOGRAM (TEE);  Surgeon: Lelon Perla, MD;  Location: [facility] ENDOSCOPY;  Service: Cardiovascular;  Laterality: N/A;  . Ablation  07/16/2013    PVI and CTI ablation by [name]    Current Outpatient Prescriptions  Medication Sig Dispense Refill  . apixaban (ELIQUIS) 5 MG TABS tablet Take 1 tablet (5 mg total) by mouth 2 (two) times daily.  60 tablet  12  . esomeprazole (NEXIUM) 20 MG capsule Take 1-2 tablets once a day      . hydrochlorothiazide 25 MG tablet Take 25 mg by mouth daily.        Marland Kitchen HYDROcodone-acetaminophen (NORCO) 7.5-325 MG per tablet  Take 1 tablet by mouth every 6 (six) hours as needed for moderate pain.      Marland Kitchen lisinopril (PRINIVIL,ZESTRIL) 10 MG tablet Take 10 mg by mouth daily.        Marland Kitchen loperamide (IMODIUM A-D) 2 MG tablet Take 1 tablet (2 mg total) by mouth 4 (four) times daily as needed for diarrhea or loose stools.  30 tablet  0  . loratadine (CLARITIN) 10 MG tablet Take 10 mg by mouth daily as needed for allergies.      . metFORMIN (GLUCOPHAGE) 500 MG tablet Take 1 tablet (500 mg total) by mouth daily.      . metoprolol succinate (TOPROL XL) 25 MG 24 hr tablet Take 0.5 tablets (12.5 mg total) by mouth daily.  60 tablet  3  . ondansetron (ZOFRAN ODT) 4 MG disintegrating tablet Take 1 tablet (4 mg total) by mouth every 8 (eight) hours as needed.  10 tablet  1  . Potassium Gluconate 550 MG TABS Take 1 tablet (550 mg total) by mouth 2 (two) times daily.  30 each  0  . simvastatin (ZOCOR) 40 MG tablet Take 40 mg by mouth at bedtime.         No current facility-administered medications for this visit.    Physical Exam: Filed Vitals:   01/06/14 1419  BP: 111/71  Pulse: 52  Height:  5\' 5"  (1.651 m)  Weight: 75.751 kg (167 lb)    GEN- The patient is well appearing, alert and oriented x 3 today.   Head- normocephalic, atraumatic Eyes-  Sclera clear, conjunctiva pink Ears- hearing intact Oropharynx- clear Lungs- Clear to ausculation bilaterally, normal work of breathing Heart- Regular rate and rhythm, no murmurs, rubs or gallops, PMI not laterally displaced GI- soft, NT, ND, + BS Extremities- no clubbing, cyanosis, or edema  Ekg today reveals sinus rhythm 52 bpm, otherwise normal ekg  Assessment and Plan:  1. afib Doing great s/p ablation, without recurrence off of AAD therapy chads2vasc score is at least 57 (age, female, dm, htn).  She should therefore continue long term anticoagulation.  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 Patient would prefer to stay on the metoprolol to help ensure  normal rhythm.  2. htn Stable No change required today  Return in 3 months

## 2014-01-06 NOTE — Patient Instructions (Signed)
Your physician recommends that you continue on your current medications as directed. Please refer to the Current Medication list given to you today. Your physician recommends that you schedule a follow-up appointment in: 3 months with Roderic Palau, NP at [facility].

## 2014-01-18 ENCOUNTER — Telehealth: Payer: Self-pay | Admitting: Internal Medicine

## 2014-01-18 ENCOUNTER — Encounter: Payer: Self-pay | Admitting: Cardiology

## 2014-01-18 NOTE — Telephone Encounter (Signed)
Pt states since office visit with [name] 01/06/14 she has had several episodes of a feeling of skipping or irregular heart beat during the night. Yesterday morning about 3:40AM woke up with an irregular heart beat.  Initially her BP was 159/104 pulse 57. About 10 minutes later her BP was 141/78 pulse 50, not feeling irregular heart beat. 9 AM yesterday-BP 128/72 pulse 52 no skips.  This morning pt states BP 156/76 pulse 57,feeling irregular heart beat. She took her medications. A little later BP 102/57 heart rate 57, no skips  I confirmed pt taking eliquis.  Pt denies lightheadedness, dizziness, or sustained irregular pulse.  She is concerned because feeling of irregular heart beat has been more frequent since office visit with [name] 01/06/14.   Pt aware I am forwarding this to [name] for review.  Pt knows to seek urgent medical care for worsening symptoms, including lightheadedness, dizziness, feeling like she is going to pass out.

## 2014-01-18 NOTE — Telephone Encounter (Signed)
New message          C/o irregular heart beat at night / pt is requesting a call from a triage nurse asap

## 2014-01-18 NOTE — Telephone Encounter (Signed)
Erika Lee, please call patient in follow-up

## 2014-01-20 NOTE — Telephone Encounter (Signed)
All day yesterday she was good, but last night her BP shot up to 201/79 HR 59.  She felt a little lightheaded.  She is going to call if her BP jumps up again today.  I told her she may be able to increase her Lisinopril some but would discuss with [name] tomorrow

## 2014-01-26 ENCOUNTER — Encounter: Payer: Self-pay | Admitting: Internal Medicine

## 2014-01-26 NOTE — Telephone Encounter (Signed)
Lynett Fish at 01/26/2014 10:55 AM     Status: Signed        Patient is having problems with BP, she feels that is jumping all around. She wants to discuss this with Claiborne Billings. Please call and advise.

## 2014-01-26 NOTE — Telephone Encounter (Signed)
°  Patient is having problems with BP, she feels that is jumping all around. She wants to discuss this with Claiborne Billings. Please call and advise.

## 2014-01-26 NOTE — Telephone Encounter (Signed)
[name] wants patient to get in to see her primary cardiologist to handle labile BP's.  Message will be sent to Kerin Ransom in [facility] to schedule

## 2014-01-26 NOTE — Telephone Encounter (Signed)
This encounter was created in error - please disregard.

## 2014-02-15 NOTE — Telephone Encounter (Signed)
Called patient back. She thinks she might be out of rhythm again. Heart rate between 82 and 96. SOB with exertion. Doses not want to be seen in the [facility] clinic. Still taking Metoprolol and Eliquis. Advised work in with [place]. She will go to the ER if feels worse.

## 2014-02-15 NOTE — Telephone Encounter (Signed)
New Message  Pt called states that her heart is racing.. Beating hard.. BP123/91 109... No other symptoms requests a call back from the nurse and same day appt

## 2014-02-15 NOTE — Telephone Encounter (Signed)
Opened to route to triage//sr

## 2014-02-16 ENCOUNTER — Ambulatory Visit (INDEPENDENT_AMBULATORY_CARE_PROVIDER_SITE_OTHER): Payer: Medicare Other | Admitting: Internal Medicine

## 2014-02-16 ENCOUNTER — Encounter: Payer: Self-pay | Admitting: Internal Medicine

## 2014-02-16 ENCOUNTER — Telehealth: Payer: Self-pay | Admitting: Internal Medicine

## 2014-02-16 VITALS — BP 145/78 | HR 58 | Ht 65.0 in | Wt 167.8 lb

## 2014-02-16 DIAGNOSIS — R002 Palpitations: Secondary | ICD-10-CM

## 2014-02-16 DIAGNOSIS — I4891 Unspecified atrial fibrillation: Secondary | ICD-10-CM

## 2014-02-16 DIAGNOSIS — I4892 Unspecified atrial flutter: Secondary | ICD-10-CM

## 2014-02-16 DIAGNOSIS — I10 Essential (primary) hypertension: Secondary | ICD-10-CM

## 2014-02-16 NOTE — Patient Instructions (Signed)
Your physician recommends that you continue on your current medications as directed. Please refer to the Current Medication list given to you today.  Your physician has recommended that you wear a holter monitor. Holter monitors are medical devices that record the heart's electrical activity. Doctors most often use these monitors to diagnose arrhythmias. Arrhythmias are problems with the speed or rhythm of the heartbeat. The monitor is a small, portable device. You can wear one while you do your normal daily activities. This is usually used to diagnose what is causing palpitations/syncope (passing out).   

## 2014-02-16 NOTE — Telephone Encounter (Deleted)
error 

## 2014-02-16 NOTE — Progress Notes (Signed)
PCP: Alonza Bogus, MD Primary Cardiologist:  Dr Elige Ko Erika Lee is a 68 y.o. female who presents today for palpitations.  Since her afib ablation, the patient had been doing well as she reported by her office visit 01/18/14. The last few days she has noted a slightly irregular heartbeat which has really concerned her. These palpitations are not sustained. More sensation of a hard heartbeat. She admits to be very cardiac aware since her ablation. The palpitations do not feel the same as her previous afib. EKG today shows a rare PAC.  Past Medical History  Diagnosis Date  . Hyperlipidemia   . Multinodular goiter   . Paroxysmal atrial fibrillation     s/p PVI by [name] 07/16/2013  . GERD (gastroesophageal reflux disease)   . Essential hypertension, benign   . Palpitations   . Diabetes mellitus     Borderline  . Arthritis   . Atrial flutter     s/p CTI by [name] 07/16/2013   Past Surgical History  Procedure Laterality Date  . Biopsy thyroid    . Tonsillectomy    . Colonoscopy  09/10/2011    Procedure: COLONOSCOPY;  Surgeon: Jamesetta So, MD;  Location: [facility];  Service: Gastroenterology;  Laterality: N/A;  . Lesion removal N/A 05/03/2013    Procedure: EXCISION CYST, BACK;  Surgeon: Jamesetta So, MD;  Location: [facility] ORS;  Service: General;  Laterality: N/A;  . Tee without cardioversion N/A 07/15/2013    Procedure: TRANSESOPHAGEAL ECHOCARDIOGRAM (TEE);  Surgeon: Lelon Perla, MD;  Location: [facility] ENDOSCOPY;  Service: Cardiovascular;  Laterality: N/A;  . Ablation  07/16/2013    PVI and CTI ablation by [name]    Current Outpatient Prescriptions  Medication Sig Dispense Refill  . apixaban (ELIQUIS) 5 MG TABS tablet Take 1 tablet (5 mg total) by mouth 2 (two) times daily.  60 tablet  12  . esomeprazole (NEXIUM) 20 MG capsule Take 1-2 tablets once a day      . hydrochlorothiazide 25 MG tablet Take 25 mg by mouth daily.        Marland Kitchen HYDROcodone-acetaminophen (NORCO)  7.5-325 MG per tablet Take 1 tablet by mouth every 6 (six) hours as needed for moderate pain.      Marland Kitchen lisinopril (PRINIVIL,ZESTRIL) 10 MG tablet Take 10 mg by mouth daily.        Marland Kitchen loratadine (CLARITIN) 10 MG tablet Take 10 mg by mouth daily as needed for allergies.      . metFORMIN (GLUCOPHAGE) 500 MG tablet Take 1 tablet (500 mg total) by mouth daily.      . metoprolol succinate (TOPROL XL) 25 MG 24 hr tablet Take 0.5 tablets (12.5 mg total) by mouth daily.  60 tablet  3  . Potassium Gluconate 550 MG TABS Take 1 tablet (550 mg total) by mouth 2 (two) times daily.  30 each  0  . simvastatin (ZOCOR) 40 MG tablet Take 40 mg by mouth at bedtime.         No current facility-administered medications for this visit.    Physical Exam: Filed Vitals:   02/16/14 1037  BP: 145/78  Pulse: 58  Height: 5\' 5"  (1.651 m)  Weight: 76.114 kg (167 lb 12.8 oz)    GEN- The patient is well appearing, alert and oriented x 3 today.   Head- normocephalic, atraumatic Eyes-  Sclera clear, conjunctiva pink Ears- hearing intact Oropharynx- clear Lungs- Clear to ausculation bilaterally, normal work of breathing Heart-  Regular rate and rhythm, no murmurs, rubs or gallops, PMI not laterally displaced GI- soft, NT, ND, + BS Extremities- no clubbing, cyanosis, or edema  Ekg today reveals sinus rhythm at 53 beats per minute with a rare PAC.  Assessment and Plan:  1. afib/palpitations Doing great s/p ablation, without recurrence off of AAD therapy chads2vasc score is at least 50 (age, female, dm, htn).  She should therefore continue long term anticoagulation.   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 Patient would prefer to stay on the metoprolol to help ensure normal rhythm.  EKG does show presence of PACs. This may be likely etiology of patient's symptoms.She will wear a 48 hour Holter monitor to further evaluate. Linq device was discussed with the patient but she defers at this  time  2. HTN Stable, BP readings from home in normal range. No change required today  Return in 2 months, already scheduled

## 2014-02-17 ENCOUNTER — Telehealth: Payer: Self-pay

## 2014-02-17 NOTE — Telephone Encounter (Signed)
Patient came to office to pick up samples of eliquis gave them to her at the front desk

## 2014-02-21 ENCOUNTER — Encounter (INDEPENDENT_AMBULATORY_CARE_PROVIDER_SITE_OTHER): Payer: Medicare Other

## 2014-02-21 ENCOUNTER — Encounter: Payer: Self-pay | Admitting: *Deleted

## 2014-02-21 DIAGNOSIS — I4892 Unspecified atrial flutter: Secondary | ICD-10-CM

## 2014-02-21 DIAGNOSIS — I4891 Unspecified atrial fibrillation: Secondary | ICD-10-CM

## 2014-02-21 DIAGNOSIS — R002 Palpitations: Secondary | ICD-10-CM

## 2014-02-21 NOTE — Progress Notes (Signed)
Patient ID: Erika Lee, female   DOB: 09-08-1945, 68 y.o.   MRN: [id number] E-Cardio 48 hour holter monitor applied to patient.

## 2014-03-02 ENCOUNTER — Telehealth: Payer: Self-pay | Admitting: Nurse Practitioner

## 2014-03-02 NOTE — Telephone Encounter (Signed)
Left message for patient to call back for monitor results.  Per Dr. Rayann Heman:  Nonsustained atrial tach; no afib; PVCs

## 2014-03-03 NOTE — Telephone Encounter (Signed)
Reviewed results with patient who verbalized understanding and we discussed possible causes of PVC's and palpitations.  Patient reports drinking a lot of Pepsi so I discussed decreasing caffeine consumption and drinking plenty of water.  Patient verbalized understanding and agreement and will call back if symptoms worsen or if patient suspects atrial fibrillation/flutter.

## 2014-03-18 ENCOUNTER — Telehealth: Payer: Self-pay | Admitting: Internal Medicine

## 2014-03-18 NOTE — Telephone Encounter (Signed)
New Prob   Pt is calling for samples of Eliquis. Please call.

## 2014-03-18 NOTE — Telephone Encounter (Signed)
No samples will keep name on list

## 2014-03-18 NOTE — Telephone Encounter (Signed)
[employer] customer can't take call  ? If patient is aware

## 2014-03-21 NOTE — Telephone Encounter (Signed)
Patient is aware about samples

## 2014-03-22 ENCOUNTER — Telehealth: Payer: Self-pay

## 2014-03-22 NOTE — Telephone Encounter (Signed)
I called the patient to let her know that i placed a month of eliquis samples up front

## 2014-04-05 ENCOUNTER — Ambulatory Visit (HOSPITAL_COMMUNITY): Payer: Medicare Other | Admitting: Nurse Practitioner

## 2014-04-08 ENCOUNTER — Encounter (HOSPITAL_COMMUNITY): Payer: Self-pay | Admitting: Nurse Practitioner

## 2014-04-08 ENCOUNTER — Ambulatory Visit (HOSPITAL_COMMUNITY)
Admission: RE | Admit: 2014-04-08 | Discharge: 2014-04-08 | Disposition: A | Discharge status: Home / Self Care | Payer: Medicare Other | Source: Ambulatory Visit | Attending: Nurse Practitioner | Admitting: Nurse Practitioner

## 2014-04-08 VITALS — BP 125/60 | HR 56 | Resp 18 | Wt 171.4 lb

## 2014-04-08 DIAGNOSIS — E119 Type 2 diabetes mellitus without complications: Secondary | ICD-10-CM | POA: Diagnosis not present

## 2014-04-08 DIAGNOSIS — E042 Nontoxic multinodular goiter: Secondary | ICD-10-CM | POA: Diagnosis not present

## 2014-04-08 DIAGNOSIS — I4891 Unspecified atrial fibrillation: Secondary | ICD-10-CM

## 2014-04-08 DIAGNOSIS — Z7901 Long term (current) use of anticoagulants: Secondary | ICD-10-CM | POA: Diagnosis not present

## 2014-04-08 DIAGNOSIS — K219 Gastro-esophageal reflux disease without esophagitis: Secondary | ICD-10-CM | POA: Diagnosis not present

## 2014-04-08 DIAGNOSIS — I493 Ventricular premature depolarization: Secondary | ICD-10-CM | POA: Diagnosis not present

## 2014-04-08 DIAGNOSIS — E785 Hyperlipidemia, unspecified: Secondary | ICD-10-CM | POA: Insufficient documentation

## 2014-04-08 DIAGNOSIS — R002 Palpitations: Secondary | ICD-10-CM | POA: Diagnosis not present

## 2014-04-08 DIAGNOSIS — I10 Essential (primary) hypertension: Secondary | ICD-10-CM | POA: Insufficient documentation

## 2014-04-08 NOTE — Progress Notes (Signed)
PCP: Alonza Bogus, MD Primary Cardiologist:  Dr Elige Ko Erika Lee is a 68 y.o. female who presents today for routine ep f/u.  Since her afib ablation, the patient had been doing well . She was seen last visit for palpitations and what she describes today still sounds consistent with this and not afib. She wore a holter monitor without any afib but with PAC's and PVC's. Ekg today shows SR with PVC's today consistent with prior monitor.No issues with NOAC. Taking consistently for chads2vasc score of at least 4.  Today, denies any chest pain, sob, presyncopy/syncopy, pedal edema.  Past Medical History  Diagnosis Date  . Hyperlipidemia   . Multinodular goiter   . Paroxysmal atrial fibrillation     s/p PVI by [name] 07/16/2013  . GERD (gastroesophageal reflux disease)   . Essential hypertension, benign   . Palpitations   . Diabetes mellitus     Borderline  . Arthritis   . Atrial flutter     s/p CTI by [name] 07/16/2013   Past Surgical History  Procedure Laterality Date  . Biopsy thyroid    . Tonsillectomy    . Colonoscopy  09/10/2011    Procedure: COLONOSCOPY;  Surgeon: Jamesetta So, MD;  Location: [facility];  Service: Gastroenterology;  Laterality: N/A;  . Lesion removal N/A 05/03/2013    Procedure: EXCISION CYST, BACK;  Surgeon: Jamesetta So, MD;  Location: [facility] ORS;  Service: General;  Laterality: N/A;  . Tee without cardioversion N/A 07/15/2013    Procedure: TRANSESOPHAGEAL ECHOCARDIOGRAM (TEE);  Surgeon: Lelon Perla, MD;  Location: [facility] ENDOSCOPY;  Service: Cardiovascular;  Laterality: N/A;  . Ablation  07/16/2013    PVI and CTI ablation by [name]    Current Outpatient Prescriptions  Medication Sig Dispense Refill  . apixaban (ELIQUIS) 5 MG TABS tablet Take 1 tablet (5 mg total) by mouth 2 (two) times daily.  60 tablet  12  . esomeprazole (NEXIUM) 20 MG capsule Take 1-2 tablets once a day      . hydrochlorothiazide 25 MG tablet Take 25 mg by mouth  daily.        Marland Kitchen HYDROcodone-acetaminophen (NORCO) 7.5-325 MG per tablet Take 1 tablet by mouth every 6 (six) hours as needed for moderate pain.      Marland Kitchen lisinopril (PRINIVIL,ZESTRIL) 10 MG tablet Take 10 mg by mouth daily.        Marland Kitchen loratadine (CLARITIN) 10 MG tablet Take 10 mg by mouth daily as needed for allergies.      . metFORMIN (GLUCOPHAGE) 500 MG tablet Take 1 tablet (500 mg total) by mouth daily.      . metoprolol succinate (TOPROL XL) 25 MG 24 hr tablet Take 0.5 tablets (12.5 mg total) by mouth daily.  60 tablet  3  . Potassium Gluconate 550 MG TABS Take 1 tablet (550 mg total) by mouth 2 (two) times daily.  30 each  0  . simvastatin (ZOCOR) 40 MG tablet Take 40 mg by mouth at bedtime.         No current facility-administered medications for this encounter.    Physical Exam: Filed Vitals:   04/08/14 1606  BP: 125/60  Pulse: 56  Resp: 18  Weight: 171 lb 6 oz (77.735 kg)  SpO2: 99%    GEN- The patient is well appearing, alert and oriented x 3 today.   Head- normocephalic, atraumatic Eyes-  Sclera clear, conjunctiva pink Ears- hearing intact Oropharynx- clear Lungs- Clear to  ausculation bilaterally, normal work of breathing Heart- Regular rate and rhythm, no murmurs, rubs or gallops, PMI not laterally displaced GI- soft, NT, ND, + BS Extremities- no clubbing, cyanosis, or edema  Ekg today reveals sinus rhythm at 60 bpm with PVC'S.( but regular rhythm on auscultation.)  Assessment and Plan:  1. afib/palpitations Doing great s/p ablation, without recurrence off of AAD therapy chads2vasc score is at least 58 (age, female, dm, htn).  She should therefore continue long term anticoagulation.   EKG does show presence of PVCs, unchanged from  prior holter monitor. Stable and minimally symptomatic.  2. HTN Stable, no change.  Return in 3 months with Dr. Rayann Heman.

## 2014-04-10 NOTE — Addendum Note (Signed)
Encounter addended by: Evalee Mutton, CCT on: 04/10/2014 12:06 PM<BR>     Documentation filed: Charges VN

## 2014-04-12 NOTE — Telephone Encounter (Signed)
error 

## 2014-06-06 ENCOUNTER — Other Ambulatory Visit: Payer: Self-pay | Admitting: *Deleted

## 2014-06-06 NOTE — Telephone Encounter (Signed)
Samples of eliquis 5 mg

## 2014-06-06 NOTE — Telephone Encounter (Signed)
Provided 3 boxes eliquis 5 mg to patient

## 2014-06-09 ENCOUNTER — Encounter (HOSPITAL_COMMUNITY): Payer: Self-pay | Admitting: Internal Medicine

## 2014-06-16 ENCOUNTER — Other Ambulatory Visit: Payer: Self-pay | Admitting: Cardiology

## 2014-06-16 MED ORDER — METOPROLOL SUCCINATE ER 25 MG PO TB24: 12.5000 mg | ORAL_TABLET | Freq: Every day | ORAL | Status: DC

## 2014-06-16 NOTE — Telephone Encounter (Signed)
Refill complete 

## 2014-06-16 NOTE — Telephone Encounter (Signed)
Received fax refill request  Rx # [id number] Medication:  Metoprolol ER Succinate 25 mg tabs Qty 60 Sig:  Take 1/2 tablet by mouth every day Physician:  Domenic Polite

## 2014-07-25 ENCOUNTER — Encounter: Payer: Self-pay | Admitting: Internal Medicine

## 2014-07-25 ENCOUNTER — Ambulatory Visit (INDEPENDENT_AMBULATORY_CARE_PROVIDER_SITE_OTHER): Payer: PPO | Admitting: Internal Medicine

## 2014-07-25 VITALS — BP 144/88 | HR 56 | Ht 64.75 in | Wt 175.4 lb

## 2014-07-25 DIAGNOSIS — I4891 Unspecified atrial fibrillation: Secondary | ICD-10-CM

## 2014-07-25 DIAGNOSIS — I480 Paroxysmal atrial fibrillation: Secondary | ICD-10-CM

## 2014-07-25 DIAGNOSIS — R0602 Shortness of breath: Secondary | ICD-10-CM | POA: Insufficient documentation

## 2014-07-25 DIAGNOSIS — I10 Essential (primary) hypertension: Secondary | ICD-10-CM

## 2014-07-25 MED ORDER — APIXABAN 5 MG PO TABS: 5.0000 mg | ORAL_TABLET | Freq: Two times a day (BID) | ORAL | Status: DC

## 2014-07-25 NOTE — Progress Notes (Signed)
Electrophysiology Office Note   Date:  07/25/2014   ID:  Erika Lee, DOB 12-09-1945, MRN [id number]  PCP:  Alonza Bogus, MD   Primary Electrophysiologist:  Thompson Grayer, MD    Chief Complaint  Patient presents with  . Follow-up    AFIB     History of Present Illness: Erika Lee is a 69 y.o. female who presents today for electrophysiology evaluation.   She has done well since her ablation.  She continues to have occasional palpitations.  These feel different than her prior afib.  Episodes occur about once per week and last 30 min to an hour in duration.   She has moderate SOB when walking up hills. Today, she denies symptoms of chest pain, shortness of breath, orthopnea, PND, lower extremity edema, claudication, dizziness, presyncope, syncope, bleeding, or neurologic sequela. The patient is tolerating medications without difficulties and is otherwise without complaint today.    Past Medical History  Diagnosis Date  . Hyperlipidemia   . Multinodular goiter   . Paroxysmal atrial fibrillation     s/p PVI by [name] 07/16/2013  . GERD (gastroesophageal reflux disease)   . Essential hypertension, benign   . Palpitations   . Diabetes mellitus     Borderline  . Arthritis   . Atrial flutter     s/p CTI by [name] 07/16/2013   Past Surgical History  Procedure Laterality Date  . Biopsy thyroid    . Tonsillectomy    . Colonoscopy  09/10/2011    Procedure: COLONOSCOPY;  Surgeon: Jamesetta So, MD;  Location: [facility];  Service: Gastroenterology;  Laterality: N/A;  . Lesion removal N/A 05/03/2013    Procedure: EXCISION CYST, BACK;  Surgeon: Jamesetta So, MD;  Location: [facility]S;  Service: General;  Laterality: N/A;  . Tee without cardioversion N/A 07/15/2013    Procedure: TRANSESOPHAGEAL ECHOCARDIOGRAM (TEE);  Surgeon: Lelon Perla, MD;  Location: [facility] ENDOSCOPY;  Service: Cardiovascular;  Laterality: N/A;  . Ablation  07/16/2013    PVI and CTI ablation by Dr  Rayann Heman  . Atrial fibrillation ablation N/A 07/16/2013    Procedure: ATRIAL FIBRILLATION ABLATION;  Surgeon: Coralyn Mark, MD;  Location: [facility] CATH LAB;  Service: Cardiovascular;  Laterality: N/A;     Current Outpatient Prescriptions  Medication Sig Dispense Refill  . apixaban (ELIQUIS) 5 MG TABS tablet Take 1 tablet (5 mg total) by mouth 2 (two) times daily. 60 tablet 12  . esomeprazole (NEXIUM) 20 MG capsule Take 1-2 tablets by mouth once a day    . hydrochlorothiazide 25 MG tablet Take 25 mg by mouth daily.      Marland Kitchen HYDROcodone-acetaminophen (NORCO) 7.5-325 MG per tablet Take 1 tablet by mouth every 6 (six) hours as needed for moderate pain.    Marland Kitchen lisinopril (PRINIVIL,ZESTRIL) 10 MG tablet Take 10 mg by mouth daily.      Marland Kitchen loratadine (CLARITIN) 10 MG tablet Take 10 mg by mouth daily as needed for allergies.    . metFORMIN (GLUCOPHAGE) 500 MG tablet Take 1 tablet (500 mg total) by mouth daily.    . metoprolol succinate (TOPROL XL) 25 MG 24 hr tablet Take 0.5 tablets (12.5 mg total) by mouth daily. 60 tablet 3  . Potassium Gluconate 550 MG TABS Take 1 tablet (550 mg total) by mouth 2 (two) times daily. 30 each 0  . simvastatin (ZOCOR) 40 MG tablet Take 40 mg by mouth at bedtime.       No  current facility-administered medications for this visit.    Allergies:   Clindamycin/lincomycin; Doxycycline; Adhesive; Latex; and Sulfonamide derivatives   Social History:  The patient  reports that she quit smoking about 20 years ago. Her smoking use included Cigarettes. She has never used smokeless tobacco. She reports that she does not drink alcohol or use illicit drugs.   Family History:  The patient's family history includes Stroke in her mother.    ROS:  Please see the history of present illness.  She reports occasional back pain.   All other systems are reviewed and negative.    PHYSICAL EXAM: VS:  BP 144/88 mmHg  Pulse 56  Ht 5' 4.75" (1.645 m)  Wt 175 lb 6.4 oz (79.561 kg)  BMI 29.40 kg/m2  , BMI Body mass index is 29.4 kg/(m^2). GEN: Well nourished, well developed, in no acute distress HEENT: normal Neck: no JVD, carotid bruits, or masses Cardiac: RRR; no murmurs, rubs, or gallops,no edema  Respiratory:  clear to auscultation bilaterally, normal work of breathing GI: soft, nontender, nondistended, + BS MS: no deformity or atrophy Skin: warm and dry  Neuro:  Strength and sensation are intact Psych: euthymic mood, full affect  EKG:  EKG is ordered today. The ekg ordered today shows sinus rhythm 56 bpm,PACs   Recent Labs: 07/28/2013: Pro B Natriuretic peptide (BNP) 134.5* 10/25/2013: ALT 15; BUN 13; Creatinine 0.72; Hemoglobin 15.8*; Platelets 192; Potassium 3.8; Sodium 137    Lipid Panel  No results found for: CHOL, TRIG, HDL, CHOLHDL, VLDL, LDLCALC, LDLDIRECT   Wt Readings from Last 3 Encounters:  07/25/14 175 lb 6.4 oz (79.561 kg)  02/16/14 167 lb 12.8 oz (76.114 kg)  01/06/14 167 lb (75.751 kg)      Other studies Reviewed: Additional studies/ records that were reviewed today include: echo from 2015  Review of the above records today demonstrates: preserved EF   ASSESSMENT AND PLAN:  1.  afib She has palpitations.  I am not certain as to whether these represent pacs, afib, or another arrhythmia.   Today, we spoke at length about monitoring with either a 30 day event monitor or an implantable loop recorder.  She has worn event monitors previously and would therefore prefer implantable loop recorder placement for afib management post ablation and for evaluation of palpitations.  Risks, benefits, and alternatives to this procedure were discussed at length with the patient and her spouse her wish to proceed.  We will arrange the procedure at the next available time. Compliance with eliquis is encouraged (chads2vasc score is at least 4).    2. HTN Stable, no change.  3. SOB Unclear etology Echo from 2015 is reviewed Given prior smoking history, will order PFTs  to evaluate for a pulmonary cause She declines stress testing at this time.   Current medicines are reviewed at length with the patient today.   The patient does not have concerns regarding her medicines.  The following changes were made today:  none  Labs/ tests ordered today include:  Orders Placed This Encounter  Procedures  . EKG 12-Lead  . Pulmonary function test     Disposition:   FU with me in  3 months   Signed, Thompson Grayer, MD  07/25/2014 12:42 PM     [address] [place] [address] [phone] (office) [phone] (fax)

## 2014-07-25 NOTE — Patient Instructions (Signed)
Your physician recommends that you schedule a follow-up appointment in: 7-10 days in device clinic for wound check---LINQ  Your physician recommends that you schedule a follow-up appointment in: 3 months with Dr. Rayann Heman  Your physician has recommended that you have a pulmonary function test. Pulmonary Function Tests are a group of tests that measure how well air moves in and out of your lungs.  Please report to the [employer] tomorrow at 6:30am for [facility]

## 2014-07-26 ENCOUNTER — Encounter (HOSPITAL_COMMUNITY): Payer: Self-pay | Admitting: *Deleted

## 2014-07-26 ENCOUNTER — Encounter (HOSPITAL_COMMUNITY)
Admission: RE | Disposition: A | Discharge status: Home / Self Care | Payer: Self-pay | Source: Ambulatory Visit | Attending: Internal Medicine

## 2014-07-26 ENCOUNTER — Ambulatory Visit (HOSPITAL_COMMUNITY)
Admission: RE | Admit: 2014-07-26 | Discharge: 2014-07-26 | Disposition: A | Discharge status: Home / Self Care | Payer: PPO | Source: Ambulatory Visit | Attending: Internal Medicine | Admitting: Internal Medicine

## 2014-07-26 DIAGNOSIS — E785 Hyperlipidemia, unspecified: Secondary | ICD-10-CM | POA: Insufficient documentation

## 2014-07-26 DIAGNOSIS — E119 Type 2 diabetes mellitus without complications: Secondary | ICD-10-CM | POA: Diagnosis not present

## 2014-07-26 DIAGNOSIS — Z882 Allergy status to sulfonamides status: Secondary | ICD-10-CM | POA: Diagnosis not present

## 2014-07-26 DIAGNOSIS — Z87891 Personal history of nicotine dependence: Secondary | ICD-10-CM | POA: Diagnosis not present

## 2014-07-26 DIAGNOSIS — Z91040 Latex allergy status: Secondary | ICD-10-CM | POA: Diagnosis not present

## 2014-07-26 DIAGNOSIS — I10 Essential (primary) hypertension: Secondary | ICD-10-CM | POA: Insufficient documentation

## 2014-07-26 DIAGNOSIS — I4891 Unspecified atrial fibrillation: Secondary | ICD-10-CM | POA: Diagnosis not present

## 2014-07-26 DIAGNOSIS — R002 Palpitations: Secondary | ICD-10-CM

## 2014-07-26 DIAGNOSIS — K219 Gastro-esophageal reflux disease without esophagitis: Secondary | ICD-10-CM | POA: Diagnosis not present

## 2014-07-26 DIAGNOSIS — Z888 Allergy status to other drugs, medicaments and biological substances status: Secondary | ICD-10-CM | POA: Insufficient documentation

## 2014-07-26 HISTORY — PX: LOOP RECORDER IMPLANT: SHX5477

## 2014-07-26 LAB — GLUCOSE, CAPILLARY: GLUCOSE-CAPILLARY: 167 mg/dL — AB (ref 70–99)

## 2014-07-26 SURGERY — LOOP RECORDER IMPLANT

## 2014-07-26 MED ORDER — LIDOCAINE-EPINEPHRINE 1 %-1:100000 IJ SOLN
INTRAMUSCULAR | Status: AC
  Filled 2014-07-26: qty 1

## 2014-07-26 NOTE — Interval H&P Note (Signed)
History and Physical Interval Note:  07/26/2014 7:18 AM  Erika Lee  has presented today for surgery, with the diagnosis of afib  The various methods of treatment have been discussed with the patient and family. After consideration of risks, benefits and other options for treatment, the patient has consented to  Procedure(s): LOOP RECORDER IMPLANT (N/A) as a surgical intervention .  The patient's history has been reviewed, patient examined, no change in status, stable for surgery.  I have reviewed the patient's chart and labs.  Questions were answered to the patient's satisfaction.     Thompson Grayer

## 2014-07-26 NOTE — H&P (View-Only) (Signed)
Electrophysiology Office Note   Date:  07/25/2014   ID:  Erika Lee, DOB 11-24-1945, MRN [id number]  PCP:  Alonza Bogus, MD   Primary Electrophysiologist:  Thompson Grayer, MD    Chief Complaint  Patient presents with  . Follow-up    AFIB     History of Present Illness: Erika Lee is a 69 y.o. female who presents today for electrophysiology evaluation.   She has done well since her ablation.  She continues to have occasional palpitations.  These feel different than her prior afib.  Episodes occur about once per week and last 30 min to an hour in duration.   She has moderate SOB when walking up hills. Today, she denies symptoms of chest pain, shortness of breath, orthopnea, PND, lower extremity edema, claudication, dizziness, presyncope, syncope, bleeding, or neurologic sequela. The patient is tolerating medications without difficulties and is otherwise without complaint today.    Past Medical History  Diagnosis Date  . Hyperlipidemia   . Multinodular goiter   . Paroxysmal atrial fibrillation     s/p PVI by [name] 07/16/2013  . GERD (gastroesophageal reflux disease)   . Essential hypertension, benign   . Palpitations   . Diabetes mellitus     Borderline  . Arthritis   . Atrial flutter     s/p CTI by [name] 07/16/2013   Past Surgical History  Procedure Laterality Date  . Biopsy thyroid    . Tonsillectomy    . Colonoscopy  09/10/2011    Procedure: COLONOSCOPY;  Surgeon: Jamesetta So, MD;  Location: [facility];  Service: Gastroenterology;  Laterality: N/A;  . Lesion removal N/A 05/03/2013    Procedure: EXCISION CYST, BACK;  Surgeon: Jamesetta So, MD;  Location: [facility];  Service: General;  Laterality: N/A;  . Tee without cardioversion N/A 07/15/2013    Procedure: TRANSESOPHAGEAL ECHOCARDIOGRAM (TEE);  Surgeon: Lelon Perla, MD;  Location: [facility] ENDOSCOPY;  Service: Cardiovascular;  Laterality: N/A;  . Ablation  07/16/2013    PVI and CTI ablation by Dr  Rayann Heman  . Atrial fibrillation ablation N/A 07/16/2013    Procedure: ATRIAL FIBRILLATION ABLATION;  Surgeon: Coralyn Mark, MD;  Location: [place] CATH LAB;  Service: Cardiovascular;  Laterality: N/A;     Current Outpatient Prescriptions  Medication Sig Dispense Refill  . apixaban (ELIQUIS) 5 MG TABS tablet Take 1 tablet (5 mg total) by mouth 2 (two) times daily. 60 tablet 12  . esomeprazole (NEXIUM) 20 MG capsule Take 1-2 tablets by mouth once a day    . hydrochlorothiazide 25 MG tablet Take 25 mg by mouth daily.      Marland Kitchen HYDROcodone-acetaminophen (NORCO) 7.5-325 MG per tablet Take 1 tablet by mouth every 6 (six) hours as needed for moderate pain.    Marland Kitchen lisinopril (PRINIVIL,ZESTRIL) 10 MG tablet Take 10 mg by mouth daily.      Marland Kitchen loratadine (CLARITIN) 10 MG tablet Take 10 mg by mouth daily as needed for allergies.    . metFORMIN (GLUCOPHAGE) 500 MG tablet Take 1 tablet (500 mg total) by mouth daily.    . metoprolol succinate (TOPROL XL) 25 MG 24 hr tablet Take 0.5 tablets (12.5 mg total) by mouth daily. 60 tablet 3  . Potassium Gluconate 550 MG TABS Take 1 tablet (550 mg total) by mouth 2 (two) times daily. 30 each 0  . simvastatin (ZOCOR) 40 MG tablet Take 40 mg by mouth at bedtime.       No  current facility-administered medications for this visit.    Allergies:   Clindamycin/lincomycin; Doxycycline; Adhesive; Latex; and Sulfonamide derivatives   Social History:  The patient  reports that she quit smoking about 20 years ago. Her smoking use included Cigarettes. She has never used smokeless tobacco. She reports that she does not drink alcohol or use illicit drugs.   Family History:  The patient's family history includes Stroke in her mother.    ROS:  Please see the history of present illness.  She reports occasional back pain.   All other systems are reviewed and negative.    PHYSICAL EXAM: VS:  BP 144/88 mmHg  Pulse 56  Ht 5' 4.75" (1.645 m)  Wt 175 lb 6.4 oz (79.561 kg)  BMI 29.40 kg/m2  , BMI Body mass index is 29.4 kg/(m^2). GEN: Well nourished, well developed, in no acute distress HEENT: normal Neck: no JVD, carotid bruits, or masses Cardiac: RRR; no murmurs, rubs, or gallops,no edema  Respiratory:  clear to auscultation bilaterally, normal work of breathing GI: soft, nontender, nondistended, + BS MS: no deformity or atrophy Skin: warm and dry  Neuro:  Strength and sensation are intact Psych: euthymic mood, full affect  EKG:  EKG is ordered today. The ekg ordered today shows sinus rhythm 56 bpm,PACs   Recent Labs: 07/28/2013: Pro B Natriuretic peptide (BNP) 134.5* 10/25/2013: ALT 15; BUN 13; Creatinine 0.72; Hemoglobin 15.8*; Platelets 192; Potassium 3.8; Sodium 137    Lipid Panel  No results found for: CHOL, TRIG, HDL, CHOLHDL, VLDL, LDLCALC, LDLDIRECT   Wt Readings from Last 3 Encounters:  07/25/14 175 lb 6.4 oz (79.561 kg)  02/16/14 167 lb 12.8 oz (76.114 kg)  01/06/14 167 lb (75.751 kg)      Other studies Reviewed: Additional studies/ records that were reviewed today include: echo from 2015  Review of the above records today demonstrates: preserved EF   ASSESSMENT AND PLAN:  1.  afib She has palpitations.  I am not certain as to whether these represent pacs, afib, or another arrhythmia.   Today, we spoke at length about monitoring with either a 30 day event monitor or an implantable loop recorder.  She has worn event monitors previously and would therefore prefer implantable loop recorder placement for afib management post ablation and for evaluation of palpitations.  Risks, benefits, and alternatives to this procedure were discussed at length with the patient and her spouse her wish to proceed.  We will arrange the procedure at the next available time. Compliance with eliquis is encouraged (chads2vasc score is at least 4).    2. HTN Stable, no change.  3. SOB Unclear etology Echo from 2015 is reviewed Given prior smoking history, will order PFTs  to evaluate for a pulmonary cause She declines stress testing at this time.   Current medicines are reviewed at length with the patient today.   The patient does not have concerns regarding her medicines.  The following changes were made today:  none  Labs/ tests ordered today include:  Orders Placed This Encounter  Procedures  . EKG 12-Lead  . Pulmonary function test     Disposition:   FU with me in  3 months   Signed, Thompson Grayer, MD  07/25/2014 12:42 PM     [address] [place] [address] [phone] (office) [phone] (fax)

## 2014-07-26 NOTE — CV Procedure (Signed)
SURGEON:  Thompson Grayer, MD     PREPROCEDURE DIAGNOSIS:  Atrial fibrillation, palpitations, prior afib ablation    POSTPROCEDURE DIAGNOSIS:   Atrial fibrillation, palpitations, prior afib ablation     PROCEDURES:   1. Implantable loop recorder implantation    INTRODUCTION:  Erika Lee is a 69 y.o. female with a history of atrial fibrillation who presents today for implantable loop implantation.  She has developed symptoms of palpitations of unclear etiology post afib ablation.    she has worn telemetry previously.  The patient therefore presents today for implantable loop implantation for afib management post ablation.     DESCRIPTION OF PROCEDURE:  Informed written consent was obtained, and the patient was brought to the electrophysiology lab in a fasting state.  The patient required no sedation for the procedure today.  Mapping over the patient's chest was performed by the EP lab staff to identify the area where electrograms were most prominent for ILR recording.  This area was found to be the left parasternal region over the 3rd-4th intercostal space. The patients left chest was therefore prepped and draped in the usual sterile fashion by the EP lab staff. The skin overlying the left parasternal region was infiltrated with lidocaine for local analgesia.  A 0.5-cm incision was made over the left parasternal region over the 3rd intercostal space.  A subcutaneous ILR pocket was fashioned using a combination of sharp and blunt dissection.  A Medtronic Reveal [place] model [id number] implantable loop recorder was then placed into the pocket  R waves were very prominent and measured 0.89mV. EBL<1 ml.  Steri- Strips and a sterile dressing were then applied.  There were no early apparent complications.     CONCLUSIONS:   1. Successful implantation of a Medtronic Reveal LINQ implantable loop recorder for evaluation of palpitations and afib management post ablation  2. No early apparent  complications.

## 2014-08-03 ENCOUNTER — Ambulatory Visit (INDEPENDENT_AMBULATORY_CARE_PROVIDER_SITE_OTHER): Payer: PPO | Admitting: *Deleted

## 2014-08-03 DIAGNOSIS — I480 Paroxysmal atrial fibrillation: Secondary | ICD-10-CM

## 2014-08-03 LAB — MDC_IDC_ENUM_SESS_TYPE_INCLINIC

## 2014-08-03 NOTE — Progress Notes (Signed)
Wound check, ILR.  Steri strips removed.  Wound well healed.  No episodes noted.  ROV in March with Dr. Rayann Heman.

## 2014-08-16 ENCOUNTER — Emergency Department (HOSPITAL_COMMUNITY): Payer: PPO

## 2014-08-16 ENCOUNTER — Encounter (HOSPITAL_COMMUNITY): Payer: Self-pay | Admitting: Emergency Medicine

## 2014-08-16 ENCOUNTER — Emergency Department (HOSPITAL_COMMUNITY)
Admission: EM | Admit: 2014-08-16 | Discharge: 2014-08-16 | Disposition: A | Discharge status: Home / Self Care | Payer: PPO | Attending: Emergency Medicine | Admitting: Emergency Medicine

## 2014-08-16 DIAGNOSIS — I480 Paroxysmal atrial fibrillation: Secondary | ICD-10-CM | POA: Insufficient documentation

## 2014-08-16 DIAGNOSIS — I10 Essential (primary) hypertension: Secondary | ICD-10-CM | POA: Insufficient documentation

## 2014-08-16 DIAGNOSIS — Z79899 Other long term (current) drug therapy: Secondary | ICD-10-CM | POA: Insufficient documentation

## 2014-08-16 DIAGNOSIS — K5900 Constipation, unspecified: Secondary | ICD-10-CM

## 2014-08-16 DIAGNOSIS — Z87891 Personal history of nicotine dependence: Secondary | ICD-10-CM | POA: Diagnosis not present

## 2014-08-16 DIAGNOSIS — K649 Unspecified hemorrhoids: Secondary | ICD-10-CM

## 2014-08-16 DIAGNOSIS — K5641 Fecal impaction: Secondary | ICD-10-CM | POA: Insufficient documentation

## 2014-08-16 DIAGNOSIS — K644 Residual hemorrhoidal skin tags: Secondary | ICD-10-CM | POA: Diagnosis not present

## 2014-08-16 DIAGNOSIS — K219 Gastro-esophageal reflux disease without esophagitis: Secondary | ICD-10-CM | POA: Diagnosis not present

## 2014-08-16 DIAGNOSIS — M1990 Unspecified osteoarthritis, unspecified site: Secondary | ICD-10-CM | POA: Diagnosis not present

## 2014-08-16 DIAGNOSIS — E785 Hyperlipidemia, unspecified: Secondary | ICD-10-CM | POA: Diagnosis not present

## 2014-08-16 DIAGNOSIS — Z91040 Latex allergy status: Secondary | ICD-10-CM | POA: Insufficient documentation

## 2014-08-16 LAB — URINALYSIS, ROUTINE W REFLEX MICROSCOPIC
BILIRUBIN URINE: NEGATIVE
GLUCOSE, UA: NEGATIVE mg/dL
KETONES UR: NEGATIVE mg/dL
Leukocytes, UA: NEGATIVE
Nitrite: NEGATIVE
PROTEIN: NEGATIVE mg/dL
Specific Gravity, Urine: 1.015 (ref 1.005–1.030)
UROBILINOGEN UA: 0.2 mg/dL (ref 0.0–1.0)
pH: 6.5 (ref 5.0–8.0)

## 2014-08-16 LAB — COMPREHENSIVE METABOLIC PANEL
ALK PHOS: 68 U/L (ref 39–117)
ALT: 19 U/L (ref 0–35)
ANION GAP: 9 (ref 5–15)
AST: 18 U/L (ref 0–37)
Albumin: 4.5 g/dL (ref 3.5–5.2)
BUN: 15 mg/dL (ref 6–23)
CALCIUM: 9.5 mg/dL (ref 8.4–10.5)
CO2: 25 mmol/L (ref 19–32)
CREATININE: 0.79 mg/dL (ref 0.50–1.10)
Chloride: 97 mmol/L (ref 96–112)
GFR, EST NON AFRICAN AMERICAN: 82 mL/min — AB (ref >90)
GLUCOSE: 117 mg/dL — AB (ref 70–99)
Potassium: 3.8 mmol/L (ref 3.5–5.1)
Sodium: 131 mmol/L — ABNORMAL LOW (ref 135–145)
Total Bilirubin: 0.7 mg/dL (ref 0.3–1.2)
Total Protein: 7.1 g/dL (ref 6.0–8.3)

## 2014-08-16 LAB — CBC WITH DIFFERENTIAL/PLATELET
Basophils Absolute: 0 10*3/uL (ref 0.0–0.1)
Basophils Relative: 0 % (ref 0–1)
EOS ABS: 0.2 10*3/uL (ref 0.0–0.7)
Eosinophils Relative: 1 % (ref 0–5)
HCT: 43.2 % (ref 36.0–46.0)
HEMOGLOBIN: 15.2 g/dL — AB (ref 12.0–15.0)
LYMPHS PCT: 14 % (ref 12–46)
Lymphs Abs: 2.2 10*3/uL (ref 0.7–4.0)
MCH: 30.1 pg (ref 26.0–34.0)
MCHC: 35.2 g/dL (ref 30.0–36.0)
MCV: 85.5 fL (ref 78.0–100.0)
Monocytes Absolute: 1.5 10*3/uL — ABNORMAL HIGH (ref 0.1–1.0)
Monocytes Relative: 9 % (ref 3–12)
NEUTROS PCT: 76 % (ref 43–77)
Neutro Abs: 12.5 10*3/uL — ABNORMAL HIGH (ref 1.7–7.7)
PLATELETS: 182 10*3/uL (ref 150–400)
RBC: 5.05 MIL/uL (ref 3.87–5.11)
RDW: 12.4 % (ref 11.5–15.5)
WBC: 16.4 10*3/uL — ABNORMAL HIGH (ref 4.0–10.5)

## 2014-08-16 LAB — URINE MICROSCOPIC-ADD ON

## 2014-08-16 NOTE — ED Notes (Signed)
Pt states that it has been about 4 days since last bowel movement and it was very little at that time.  States lower abd is hurting.  Has used fleets enema and 2 stool softeners with no relief.

## 2014-08-16 NOTE — ED Provider Notes (Signed)
CSN: [id number]     Arrival date & time 08/16/14  1430 History   First MD Initiated Contact with Patient 08/16/14 1747     Chief Complaint  Patient presents with  . Constipation      HPI Pt was seen at 1805. Per pt, c/o gradual onset and persistence of constant "constipation" for the past 4 to 5 days. Pt states her last BM was "very small and hard." Pt states she took a stool softener and enema without relief. States she feels "like there is stool in my rectum" and "my hemorrhoids are acting up." Has been associated with lower abd "pain." Denies N/V/D, no blood in stool/black stool, no back pain, no CP/SOB, no fevers.    Past Medical History  Diagnosis Date  . Hyperlipidemia   . Multinodular goiter   . Paroxysmal atrial fibrillation     s/p PVI by [name] 07/16/2013  . GERD (gastroesophageal reflux disease)   . Essential hypertension, benign   . Palpitations   . Diabetes mellitus     Borderline  . Arthritis   . Atrial flutter     s/p CTI by [name] 07/16/2013   Past Surgical History  Procedure Laterality Date  . Biopsy thyroid    . Tonsillectomy    . Colonoscopy  09/10/2011    Procedure: COLONOSCOPY;  Surgeon: Jamesetta So, MD;  Location: [facility];  Service: Gastroenterology;  Laterality: N/A;  . Lesion removal N/A 05/03/2013    Procedure: EXCISION CYST, BACK;  Surgeon: Jamesetta So, MD;  Location: [facility];  Service: General;  Laterality: N/A;  . Tee without cardioversion N/A 07/15/2013    Procedure: TRANSESOPHAGEAL ECHOCARDIOGRAM (TEE);  Surgeon: Lelon Perla, MD;  Location: [facility] ENDOSCOPY;  Service: Cardiovascular;  Laterality: N/A;  . Atrial fibrillation ablation N/A 07/16/2013    PVI and CTI ablation by [name]  . Loop recorder implant  07/26/14    MDT LINQ implanted by [name] for palpitations  . Loop recorder implant N/A 07/26/2014    Procedure: LOOP RECORDER IMPLANT;  Surgeon: Thompson Grayer, MD;  Location: [facility] CATH LAB;  Service: Cardiovascular;  Laterality:  N/A;   Family History  Problem Relation Age of Onset  . Stroke Mother    History  Substance Use Topics  . Smoking status: Former Smoker    Types: Cigarettes    Quit date: 11/29/1993  . Smokeless tobacco: Never Used  . Alcohol Use: No    Review of Systems ROS: Statement: All systems negative except as marked or noted in the HPI; Constitutional: Negative for fever and chills. ; ; Eyes: Negative for eye pain, redness and discharge. ; ; ENMT: Negative for ear pain, hoarseness, nasal congestion, sinus pressure and sore throat. ; ; Cardiovascular: Negative for chest pain, palpitations, diaphoresis, dyspnea and peripheral edema. ; ; Respiratory: Negative for cough, wheezing and stridor. ; ; Gastrointestinal: +constipation. Negative for nausea, vomiting, diarrhea, abdominal pain, blood in stool, hematemesis, jaundice and rectal bleeding. . ; ; Genitourinary: Negative for dysuria, flank pain and hematuria. ; ; Musculoskeletal: Negative for back pain and neck pain. Negative for swelling and trauma.; ; Skin: Negative for pruritus, rash, abrasions, blisters, bruising and skin lesion.; ; Neuro: Negative for headache, lightheadedness and neck stiffness. Negative for weakness, altered level of consciousness , altered mental status, extremity weakness, paresthesias, involuntary movement, seizure and syncope.      Allergies  Clindamycin/lincomycin; Doxycycline; Adhesive; Latex; and Sulfonamide derivatives  Home Medications   Prior to  Admission medications   Medication Sig Start Date End Date Taking? Authorizing Provider  apixaban (ELIQUIS) 5 MG TABS tablet Take 1 tablet (5 mg total) by mouth 2 (two) times daily. 07/25/14  Yes Thompson Grayer, MD  cetirizine (ZYRTEC) 10 MG tablet Take 10 mg by mouth daily as needed for allergies.   Yes Historical Provider, MD  esomeprazole (NEXIUM) 20 MG capsule Take 20 mg by mouth 2 (two) times daily before a meal.    Yes Historical Provider, MD  hydrochlorothiazide 25 MG  tablet Take 25 mg by mouth daily.     Yes Historical Provider, MD  HYDROcodone-acetaminophen (NORCO) 7.5-325 MG per tablet Take 1 tablet by mouth every 6 (six) hours as needed for moderate pain.   Yes Historical Provider, MD  lisinopril (PRINIVIL,ZESTRIL) 10 MG tablet Take 10 mg by mouth daily.     Yes Historical Provider, MD  metFORMIN (GLUCOPHAGE) 500 MG tablet Take 1 tablet (500 mg total) by mouth daily. 07/17/13  Yes Brett Canales, PA-C  metoprolol succinate (TOPROL XL) 25 MG 24 hr tablet Take 0.5 tablets (12.5 mg total) by mouth daily. 06/16/14  Yes Satira Sark, MD  Potassium Gluconate 550 MG TABS Take 1 tablet (550 mg total) by mouth 2 (two) times daily. 06/13/13  Yes Alonza Bogus, MD  simvastatin (ZOCOR) 40 MG tablet Take 40 mg by mouth at bedtime.     Yes Historical Provider, MD   BP 152/81 mmHg  Pulse 71  Temp(Src) 98.3 F (36.8 C) (Oral)  Resp 20  Ht 5\' 5"  (1.651 m)  Wt 170 lb (77.111 kg)  BMI 28.29 kg/m2  SpO2 100% Physical Exam  1810: Physical examination:  Nursing notes reviewed; Vital signs and O2 SAT reviewed;  Constitutional: Well developed, Well nourished, Well hydrated, In no acute distress; Head:  Normocephalic, atraumatic; Eyes: EOMI, PERRL, No scleral icterus; ENMT: Mouth and pharynx normal, Mucous membranes moist; Neck: Supple, Full range of motion, No lymphadenopathy; Cardiovascular: Regular rate and rhythm, No gallop; Respiratory: Breath sounds clear & equal bilaterally, No wheezes.  Speaking full sentences with ease, Normal respiratory effort/excursion; Chest: Nontender, Movement normal; Abdomen: Soft, +very mild suprapubic tenderness to palp. No rebound or guarding. Nondistended, Normal bowel sounds. Rectal exam performed w/permission of pt and ED RN chaperone present.  Anal tone normal.  Non-tender. +fecal impaction. No fissures, +multiple external hemorrhoids without thrombosis. No palp masses.; Genitourinary: No CVA tenderness; Extremities: Pulses normal, No  tenderness, No edema, No calf edema or asymmetry.; Neuro: AA&Ox3, Major CN grossly intact.  Speech clear. No gross focal motor or sensory deficits in extremities. Climbs on and off stretcher easily by herself. Gait steady.; Skin: Color normal, Warm, Dry.   ED Course  Procedures       EKG Interpretation None      MDM  MDM Reviewed: previous chart, nursing note and vitals Reviewed previous: labs Interpretation: x-ray and labs     Results for orders placed or performed during the hospital encounter of 08/16/14  CBC with Differential  Result Value Ref Range   WBC 16.4 (H) 4.0 - 10.5 K/uL   RBC 5.05 3.87 - 5.11 MIL/uL   Hemoglobin 15.2 (H) 12.0 - 15.0 g/dL   HCT 43.2 36.0 - 46.0 %   MCV 85.5 78.0 - 100.0 fL   MCH 30.1 26.0 - 34.0 pg   MCHC 35.2 30.0 - 36.0 g/dL   RDW 12.4 11.5 - 15.5 %   Platelets 182 150 - 400 K/uL   Neutrophils  Relative % 76 43 - 77 %   Neutro Abs 12.5 (H) 1.7 - 7.7 K/uL   Lymphocytes Relative 14 12 - 46 %   Lymphs Abs 2.2 0.7 - 4.0 K/uL   Monocytes Relative 9 3 - 12 %   Monocytes Absolute 1.5 (H) 0.1 - 1.0 K/uL   Eosinophils Relative 1 0 - 5 %   Eosinophils Absolute 0.2 0.0 - 0.7 K/uL   Basophils Relative 0 0 - 1 %   Basophils Absolute 0.0 0.0 - 0.1 K/uL  Comprehensive metabolic panel  Result Value Ref Range   Sodium 131 (L) 135 - 145 mmol/L   Potassium 3.8 3.5 - 5.1 mmol/L   Chloride 97 96 - 112 mmol/L   CO2 25 19 - 32 mmol/L   Glucose, Bld 117 (H) 70 - 99 mg/dL   BUN 15 6 - 23 mg/dL   Creatinine, Ser 0.79 0.50 - 1.10 mg/dL   Calcium 9.5 8.4 - 10.5 mg/dL   Total Protein 7.1 6.0 - 8.3 g/dL   Albumin 4.5 3.5 - 5.2 g/dL   AST 18 0 - 37 U/L   ALT 19 0 - 35 U/L   Alkaline Phosphatase 68 39 - 117 U/L   Total Bilirubin 0.7 0.3 - 1.2 mg/dL   GFR calc non Af Amer 82 (L) >90 mL/min   GFR calc Af Amer >90 >90 mL/min   Anion gap 9 5 - 15  Urinalysis, Routine w reflex microscopic  Result Value Ref Range   Color, Urine YELLOW YELLOW   APPearance  CLEAR CLEAR   Specific Gravity, Urine 1.015 1.005 - 1.030   pH 6.5 5.0 - 8.0   Glucose, UA NEGATIVE NEGATIVE mg/dL   Hgb urine dipstick TRACE (A) NEGATIVE   Bilirubin Urine NEGATIVE NEGATIVE   Ketones, ur NEGATIVE NEGATIVE mg/dL   Protein, ur NEGATIVE NEGATIVE mg/dL   Urobilinogen, UA 0.2 0.0 - 1.0 mg/dL   Nitrite NEGATIVE NEGATIVE   Leukocytes, UA NEGATIVE NEGATIVE  Urine microscopic-add on  Result Value Ref Range   Squamous Epithelial / LPF FEW (A) RARE   WBC, UA 0-2 <3 WBC/hpf   RBC / HPF 0-2 <3 RBC/hpf   Bacteria, UA RARE RARE   Dg Abd Acute W/chest 08/16/2014   CLINICAL DATA:  Lower abdominal pain for 2 days  EXAM: ACUTE ABDOMEN SERIES (ABDOMEN 2 VIEW & CHEST 1 VIEW)  COMPARISON:  10/25/2013  FINDINGS: Cardiac shadow is within normal limits. A loop recorder is noted. Lungs are clear bilaterally. No focal infiltrate or sizable effusion is seen.  Scattered large and small bowel gas is noted. Fecal material is noted throughout the colon. No free air is seen. Calcified uterine fibroid is noted. Diffuse degenerative changes of the thoracolumbar spine are noted.  IMPRESSION: No acute abnormality noted.   Electronically Signed   By: Inez Catalina M.D.   On: 08/16/2014 18:47    2125:  Pt with fecal impaction. Disimpacted with pt's permission and ED Tech chaperone present. Pt passed several BM's after disimpaction. Pt has tol PO well while in the ED without N/V.  Abd benign, VSS. Feels better and wants to go home now. Dx and testing d/w pt and family.  Questions answered.  Verb understanding, agreeable to d/c home with outpt f/u.    Francine Graven, DO 08/19/14 0009

## 2014-08-16 NOTE — Discharge Instructions (Signed)
°Emergency Department Resource Guide °1) Find a Doctor and Pay Out of Pocket °Although you won't have to find out who is covered by your insurance plan, it is a good idea to ask around and get recommendations. You will then need to call the office and see if the doctor you have chosen will accept you as a new patient and what types of options they offer for patients who are self-pay. Some doctors offer discounts or will set up payment plans for their patients who do not have insurance, but you will need to ask so you aren't surprised when you get to your appointment. ° °2) Contact Your Local Health Department °Not all health departments have doctors that can see patients for sick visits, but many do, so it is worth a call to see if yours does. If you don't know where your local health department is, you can check in your phone book. The CDC also has a tool to help you locate your state's health department, and many state websites also have listings of all of their local health departments. ° °3) Find a Walk-in Clinic °If your illness is not likely to be very severe or complicated, you may want to try a walk in clinic. These are popping up all over the country in pharmacies, drugstores, and shopping centers. They're usually staffed by nurse practitioners or physician assistants that have been trained to treat common illnesses and complaints. They're usually fairly quick and inexpensive. However, if you have serious medical issues or chronic medical problems, these are probably not your best option. ° °No Primary Care Doctor: °- Call [facility] at  832-8000 - they can help you locate a primary care doctor that  accepts your insurance, provides certain services, etc. °- Physician Referral Service- 1-800-533-3463 ° °Chronic Pain Problems: °Organization         Address  Phone   Notes  °[facility]  (336) 297-2271 Patients need to be referred by their primary care doctor.  ° °Medication  Assistance: °Organization         Address  Phone   Notes  °[place] [facility] [address] °[place], [place] [address] (336) 641-8030 --Must be a resident of [place] °-- Must have NO insurance coverage whatsoever (no Medicaid/ Medicare, etc.) °-- The pt. MUST have a primary care doctor that directs their care regularly and follows them in the community °  °[facility]  (866) 331-1348   °[employer]  (888) 892-1162   ° °Agencies that provide inexpensive medical care: °Organization         Address  Phone   Notes  °[facility]  (336) 832-8035   °[facility]    (336) 832-7272   °[facility] [address] °[place], [place] [address] (336) 832-4777   °[facility] [address], °[place] (336) 271-4999   °[facility]    (336) 373-0678   °[facility]    (336) 272-1050   °[facility] ° [address], [place] Phone:  (336) 832-4444, Fax:  (336) 832-4440 Hours of Operation:  9 am - 6 pm, M-F.  Also accepts Medicaid/Medicare and self-pay.  °[facility] ° [address], [place] Phone: (336) 832-3150, Fax: (336) 832-3151. Hours of Operation:  8:30 am - 5:30 pm, M-F.  Also accepts Medicaid and self-pay.  °[facility] [place] [address], [place] Phone: (336) 878-6027   °[facility] [address], [place], [place] (336)723-1848, Ext. 123 Mondays & Thursdays: 7-9 AM.  First 15 patients are seen on a first come, first serve basis. °  ° °Medicaid-accepting [place] Providers: ° °Organization         Address  Phone   Notes  °[facility] [address], [place] (336) 641-2100 Also accepts self-pay patients.  °[facility] [address], [place] ° (336) 856-9996   °[facility] [address], [place]  (336) 288-8857   °[facility] [address] [place] [address], [place] (336) 299-7000   °Veita Bland [address], [place]  ° (336) 373-1557 Only accepts [place] Access Medicaid patients after they have their name applied to their card.  ° °Self-Pay (no insurance) in [place]: ° °Organization         Address  Phone   Notes  °Sickle Cell Patients, [facility] [address], [place] (336) 832-1970   °[facility] [address], [place] (336) 832-4400   °[facility] ° [address], [place] (336) 992-4800   °[facility]/Dr. Osei-Bonsu ° [address] [place] [address], [place] or [address], [place] (336) 841-8500 Phone number for both [place] and [place] locations is the same.  °[facility] [address], [place] (336) 299-0000   °[facility] [address] [place] [address], [place] or [address] (336) 852-7530 °(336) 878-2260   °[facility] [address], [place] (336) 350-1642, phone; (336) 294-5005, fax Sees patients 1st and 3rd Saturday of every month.  Must not qualify for public or private insurance (i.e. Medicaid, Medicare, [place] Health Choice, Veterans' Benefits) • Household income should be no more than 200% of the poverty level •The clinic cannot treat you if you are pregnant or think you are pregnant • Sexually transmitted diseases are not treated at the clinic.  ° ° °Dental Care: °Organization         Address  Phone  Notes  °[place] [facility] [address], [place] (336) 641-6152 Accepts children up to age 21 who are enrolled in Medicaid or [place] Health Choice; pregnant women with a Medicaid card; and children who have applied for Medicaid or [place] Health Choice, but were declined, whose parents can pay a reduced fee at time of service.  °[place]  [facility] [place]  [address], [place] (336) 641-7733 Accepts children up to age 21 who are enrolled in Medicaid or [place] Health Choice; pregnant women with a Medicaid card; and children who have applied for Medicaid or [place] Health Choice, but were declined, whose parents can pay a reduced fee at time of service.  °[facility] ° [address], [place] (336) 641-4533 Patients are seen by appointment only. Walk-ins are not accepted. [facility] will see patients 18 years of age and older. °Monday - Tuesday (8am-5pm) °Most Wednesdays (8:30-5pm) °$30 per visit, cash only  °[facility] ° [address], [place] (336) 641-4533 Patients are seen by appointment only. Walk-ins are not accepted. [facility] will see patients 18 years of age and older. °One   Wednesday Evening (Monthly: Volunteer Based).  $30 per visit, cash only  °[facility]  (919) 537-3737 for adults; Children under age 4, call Graduate Pediatric Dentistry at ([phone]) [phone]. Children aged 4-14, please call (919) [phone] to request a pediatric application. ° Dental services are provided in all areas of dental care including fillings, crowns and bridges, complete and partial dentures, implants, gum treatment, root canals, and extractions. Preventive care is also provided. Treatment is provided to both adults and children. °Patients are selected via a lottery and there is often a waiting list. °  °[facility] [address], °[place] ° (336) 763-8833 [email or web address].[email or web address] °  °[facility] [address], [place], [place] (336)723-1848, Ext. 123 Second and Fourth Thursday of each month, opens at 6:30 AM; Clinic ends at 9 AM.  Patients are seen on a first-come first-served basis, and a limited number are seen during each clinic.  ° °[facility] ° [address], [place], [place] (336) 723-7904    Eligibility Requirements °You must have lived in [place], or [place] counties for at least the last three months. °  You cannot be eligible for state or federal sponsored healthcare insurance, including Veterans Administration, Medicaid, or Medicare. °  You generally cannot be eligible for healthcare insurance through your employer.  °  How to apply: °Eligibility screenings are held every Tuesday and Wednesday afternoon from 1:00 pm until 4:00 pm. You do not need an appointment for the interview!  °[facility] [address], [place], [place] 336-631-2330   °[facility]  336-342-8273   °[facility]  336-703-3100   °[facility] [place] [facility]  336-570-6415   ° °Behavioral Health Resources in the Community: °Intensive Outpatient Programs °Organization         Address  Phone  Notes  °[facility] [address], [place], [place] 336-878-6098   °[facility] [address], [place], [place] 336-832-9800   °[facility] [address], [place], [place] ° 336-882-2125   °[facility] [address],  °[place], [place] 1-800-853-5163 or 336-641-4981   °Substance Abuse Resources °Organization         Address  Phone  Notes  °[facility]  336-882-2125   °[facility]  336-784-9470   °[facility]  336-285-9073   °[facility]  336-845-3988   °Residential & Outpatient Substance Abuse Program  1-800-659-3381   °Psychological Services °Organization         Address  Phone  Notes  °[facility]  336- 832-9600   °[facility]  336- 378-7881   °[facility] [address], [place] 1-800-853-5163 or 336-641-4981   ° °Mobile Crisis Teams °Organization         Address  Phone  Notes  °[facility], Mobile Crisis Care Unit  1-877-626-1772   °Assertive °[facility] ° [address]  [place], [place] 336-834-9664   °Sharon DeEsch [address] °[place] 336-554-5454   ° °Self-Help/Support Groups °Organization         Address  Phone             Notes  °[facility] [place] - variety of support groups  336- 373-1402 Call for more information  °Narcotics Anonymous (NA), [facility] [address], °[place]  2 meetings at this location  ° °  Residential Treatment Programs Organization         Address  Phone  Notes  [facility] [address],    [place]  1-213-592-3611   [facility]  [address] [place] [address], [place], [place]   [place], [place] (628) 493-6419 Admissions: 8am-3pm M-F  [facility] [place] [address],    [place], [place] [phone]   [facility] [address] [place], [place], [place]   [facility] [address],  [place], [place]   [facility] - Intensive Outpatient [address], [place], [place]   [facility] ([place] [address]  [place], [place] 1-920 183 9246 or 657-579-6689   [facility] [address] [place], [place] Accepts Medicaid  [facility] [address]  [place] 1-458-562-4816 Substance Abuse/Addiction Treatment   [facility] Organization         Address  Phone  Notes  [facility]  934-424-3302   Domenic Schwab, PhD [address] [place], [place]   949 313 2478 or 8195349650   [facility] [place], [place] (830)334-5684   [facility] [address] [place], [place] 3065748847 Insurance/Medicaid/sponsorship through [facility] and Families [address]                                    [place], [place] [phone] [place] [address], [place] (757)[phone]    Dr. Adele Schilder  ([phone]   [facility] Dept. 1) [address], [place] 2) [place] 3)  [address], [place] ([phone] [phone]  984-719-0647   [place] 9496095918 or ([phone] (After Hours)      Take over the counter laxative (such as miralax, milk of magnesia, senokot) AND a dulcolax suppository today and repeat both tomorrow.  Begin to take over the counter stool softener (colace), as directed on packaging, for the next month.  Continue to take your usual prescriptions as previously directed. Use over the counter hemorrhoidal relief products (ie:  preparation H, anusol), as directed on packaging, as needed for symptoms.  Sit in a warm water tub several times per day for the next week.  Call your regular medical doctor tomorrow to schedule a follow up appointment within the next 3 days.  Return to the Emergency Department immediately if worsening.

## 2014-08-18 LAB — URINE CULTURE: Colony Count: 3000

## 2014-08-19 ENCOUNTER — Encounter: Payer: Self-pay | Admitting: Internal Medicine

## 2014-08-25 ENCOUNTER — Ambulatory Visit (INDEPENDENT_AMBULATORY_CARE_PROVIDER_SITE_OTHER): Payer: PPO | Admitting: *Deleted

## 2014-08-25 DIAGNOSIS — I480 Paroxysmal atrial fibrillation: Secondary | ICD-10-CM

## 2014-08-26 NOTE — Progress Notes (Signed)
Loop recorder 

## 2014-08-30 ENCOUNTER — Ambulatory Visit (INDEPENDENT_AMBULATORY_CARE_PROVIDER_SITE_OTHER): Payer: PPO | Admitting: Internal Medicine

## 2014-08-30 DIAGNOSIS — R0602 Shortness of breath: Secondary | ICD-10-CM

## 2014-08-30 LAB — PULMONARY FUNCTION TEST
DL/VA % pred: 104 %
DL/VA: 4.93 ml/min/mmHg/L
DLCO UNC % PRED: 82 %
DLCO unc: 19.27 ml/min/mmHg
FEF 25-75 Post: 1.11 L/sec
FEF 25-75 Pre: 1.08 L/sec
FEF2575-%Change-Post: 2 %
FEF2575-%PRED-PRE: 59 %
FEF2575-%Pred-Post: 60 %
FEV1-%CHANGE-POST: 1 %
FEV1-%PRED-POST: 76 %
FEV1-%PRED-PRE: 74 %
FEV1-POST: 1.65 L
FEV1-Pre: 1.62 L
FEV1FVC-%CHANGE-POST: 13 %
FEV1FVC-%Pred-Pre: 97 %
FEV6-%Change-Post: -8 %
FEV6-%PRED-PRE: 78 %
FEV6-%Pred-Post: 71 %
FEV6-PRE: 2.16 L
FEV6-Post: 1.97 L
FEV6FVC-%Change-Post: 1 %
FEV6FVC-%PRED-PRE: 103 %
FEV6FVC-%Pred-Post: 105 %
FVC-%Change-Post: -10 %
FVC-%Pred-Post: 68 %
FVC-%Pred-Pre: 76 %
FVC-PRE: 2.2 L
FVC-Post: 1.97 L
POST FEV6/FVC RATIO: 100 %
Post FEV1/FVC ratio: 84 %
Pre FEV1/FVC ratio: 74 %
Pre FEV6/FVC Ratio: 98 %
RV % PRED: 83 %
RV: 1.81 L
TLC % pred: 83 %
TLC: 4.13 L

## 2014-08-30 NOTE — Progress Notes (Signed)
PFT done today. 

## 2014-09-02 ENCOUNTER — Encounter: Payer: Self-pay | Admitting: Internal Medicine

## 2014-09-05 ENCOUNTER — Telehealth: Payer: Self-pay | Admitting: *Deleted

## 2014-09-05 NOTE — Telephone Encounter (Signed)
Pt notified of pft results

## 2014-09-05 NOTE — Telephone Encounter (Signed)
-----   Message from Thompson Grayer, MD sent at 08/31/2014  8:48 PM EST ----- Results reviewed.  Please inform pt of result.

## 2014-09-08 LAB — MDC_IDC_ENUM_SESS_TYPE_REMOTE: MDC IDC SESS DTM: 20160221050500

## 2014-09-12 ENCOUNTER — Ambulatory Visit (HOSPITAL_COMMUNITY)
Admission: RE | Admit: 2014-09-12 | Discharge: 2014-09-12 | Disposition: A | Discharge status: Home / Self Care | Payer: PPO | Source: Ambulatory Visit | Attending: Pulmonary Disease | Admitting: Pulmonary Disease

## 2014-09-12 ENCOUNTER — Other Ambulatory Visit (HOSPITAL_COMMUNITY): Payer: Self-pay | Admitting: Pulmonary Disease

## 2014-09-12 DIAGNOSIS — M25561 Pain in right knee: Secondary | ICD-10-CM

## 2014-09-12 DIAGNOSIS — R937 Abnormal findings on diagnostic imaging of other parts of musculoskeletal system: Secondary | ICD-10-CM | POA: Insufficient documentation

## 2014-09-19 ENCOUNTER — Encounter: Payer: Self-pay | Admitting: Internal Medicine

## 2014-09-23 ENCOUNTER — Ambulatory Visit (INDEPENDENT_AMBULATORY_CARE_PROVIDER_SITE_OTHER): Payer: PPO | Admitting: *Deleted

## 2014-09-23 DIAGNOSIS — I480 Paroxysmal atrial fibrillation: Secondary | ICD-10-CM

## 2014-09-26 ENCOUNTER — Encounter: Payer: Self-pay | Admitting: Internal Medicine

## 2014-09-26 ENCOUNTER — Ambulatory Visit (INDEPENDENT_AMBULATORY_CARE_PROVIDER_SITE_OTHER): Payer: PPO | Admitting: Internal Medicine

## 2014-09-26 ENCOUNTER — Other Ambulatory Visit: Payer: Self-pay

## 2014-09-26 VITALS — BP 126/72 | HR 53 | Ht 64.5 in | Wt 172.6 lb

## 2014-09-26 DIAGNOSIS — I10 Essential (primary) hypertension: Secondary | ICD-10-CM

## 2014-09-26 DIAGNOSIS — I480 Paroxysmal atrial fibrillation: Secondary | ICD-10-CM

## 2014-09-26 DIAGNOSIS — R0602 Shortness of breath: Secondary | ICD-10-CM

## 2014-09-26 NOTE — Progress Notes (Signed)
Electrophysiology Office Note   Date:  09/26/2014   ID:  Erika Lee, DOB 1946-04-03, MRN [id number]  PCP:  Alonza Bogus, MD   Primary Electrophysiologist:  Thompson Grayer, MD    Chief Complaint  Patient presents with  . Atrial Fibrillation    PAF     History of Present Illness: Erika Lee is a 69 y.o. female who presents today for electrophysiology evaluation.   She has done well since her ablation.  She continues to have occasional palpitations.    She has moderate SOB when walking up hills but feels that this is stable. Today, she denies symptoms of chest pain, , orthopnea, PND, lower extremity edema, claudication, dizziness, presyncope, syncope, bleeding, or neurologic sequela. The patient is tolerating medications without difficulties and is otherwise without complaint today.    Past Medical History  Diagnosis Date  . Hyperlipidemia   . Multinodular goiter   . Paroxysmal atrial fibrillation     s/p PVI by [name] 07/16/2013  . GERD (gastroesophageal reflux disease)   . Essential hypertension, benign   . Palpitations   . Diabetes mellitus     Borderline  . Arthritis   . Atrial flutter     s/p CTI by [name] 07/16/2013   Past Surgical History  Procedure Laterality Date  . Biopsy thyroid    . Tonsillectomy    . Colonoscopy  09/10/2011    Procedure: COLONOSCOPY;  Surgeon: Jamesetta So, MD;  [facility];  Service: Gastroenterology;  Laterality: N/A;  . Lesion removal N/A 05/03/2013    Procedure: EXCISION CYST, BACK;  Surgeon: Jamesetta So, MD;  Location: [facility] ORS;  Service: General;  Laterality: N/A;  . Tee without cardioversion N/A 07/15/2013    Procedure: TRANSESOPHAGEAL ECHOCARDIOGRAM (TEE);  Surgeon: Lelon Perla, MD;  Location[facility] ENDOSCOPY;  Service: Cardiovascular;  Laterality: N/A;  . Atrial fibrillation ablation N/A 07/16/2013    PVI and CTI ablation by [name]  . Loop recorder implant  07/26/14    MDT LINQ implanted by [name]  for palpitations  . Loop recorder implant N/A 07/26/2014    Procedure: LOOP RECORDER IMPLANT;  Surgeon: Thompson Grayer, MD;  Location: [facility] LAB;  Service: Cardiovascular;  Laterality: N/A;     Current Outpatient Prescriptions  Medication Sig Dispense Refill  . apixaban (ELIQUIS) 5 MG TABS tablet Take 1 tablet (5 mg total) by mouth 2 (two) times daily. 60 tablet 12  . cetirizine (ZYRTEC) 10 MG tablet Take 10 mg by mouth daily as needed for allergies.    Marland Kitchen esomeprazole (NEXIUM) 20 MG capsule Take 20 mg by mouth 2 (two) times daily before a meal.     . hydrochlorothiazide 25 MG tablet Take 25 mg by mouth daily.      Marland Kitchen HYDROcodone-acetaminophen (NORCO) 7.5-325 MG per tablet Take 1 tablet by mouth every 6 (six) hours as needed for moderate pain.    Marland Kitchen lisinopril (PRINIVIL,ZESTRIL) 10 MG tablet Take 10 mg by mouth daily.      . metFORMIN (GLUCOPHAGE) 500 MG tablet Take 1 tablet (500 mg total) by mouth daily.    . metoprolol succinate (TOPROL XL) 25 MG 24 hr tablet Take 0.5 tablets (12.5 mg total) by mouth daily. 60 tablet 3  . Potassium Gluconate 550 MG TABS Take 1 tablet (550 mg total) by mouth 2 (two) times daily. 30 each 0  . simvastatin (ZOCOR) 40 MG tablet Take 40 mg by mouth at bedtime.      Marland Kitchen  SSD 1 % cream Apply 1 application topically daily as needed. skin  12   No current facility-administered medications for this visit.    Allergies:   Clindamycin/lincomycin; Doxycycline; Adhesive; Latex; and Sulfonamide derivatives   Social History:  The patient  reports that she quit smoking about 20 years ago. Her smoking use included Cigarettes. She has never used smokeless tobacco. She reports that she does not drink alcohol or use illicit drugs.   Family History:  The patient's family history includes Kidney disease in her maternal grandfather; Other in her maternal grandfather; Stroke in her maternal grandmother; Stroke (age of onset: 28) in her mother.    ROS:  Please see the history of  present illness.  She reports occasional back pain.   All other systems are reviewed and negative.    PHYSICAL EXAM: VS:  BP 126/72 mmHg  Pulse 53  Ht 5' 4.5" (1.638 m)  Wt 172 lb 9.6 oz (78.291 kg)  BMI 29.18 kg/m2 , BMI Body mass index is 29.18 kg/(m^2). GEN: Well nourished, well developed, in no acute distress HEENT: normal Neck: no JVD, carotid bruits, or masses Cardiac: RRR; no murmurs, rubs, or gallops,no edema  Respiratory:  clear to auscultation bilaterally, normal work of breathing GI: soft, nontender, nondistended, + BS MS: no deformity or atrophy Skin: warm and dry  Neuro:  Strength and sensation are intact Psych: euthymic mood, full affect ILR site is well healed  ILR ILR interrogation today is reviewed (see paceart)  Recent Labs: 08/16/2014: ALT 19; BUN 15; Creatinine 0.79; Hemoglobin 15.2*; Platelets 182; Potassium 3.8; Sodium 131*    Lipid Panel  No results found for: CHOL, TRIG, HDL, CHOLHDL, VLDL, LDLCALC, LDLDIRECT   Wt Readings from Last 3 Encounters:  09/26/14 172 lb 9.6 oz (78.291 kg)  08/16/14 170 lb (77.111 kg)  07/26/14 175 lb (79.379 kg)     Other studies Reviewed: Additional studies/ records that were reviewed today include: echo from 2015  Review of the above records today demonstrates: preserved EF   ASSESSMENT AND PLAN:  1.  afib Well controlled ILR interrogation reveals only 12 min of afib once which was rate controlled since implant Compliance with eliquis is encouraged (chads2vasc score is at least 4).    2. HTN Stable, no change.  3. SOB PFTs are reviewed She declines stress testing at this time.  Current medicines are reviewed at length with the patient today.   The patient does not have concerns regarding her medicines.  The following changes were made today:  none  Disposition:   FU with Roderic Palau NP in  3 months in the AF clinic   Signed, Thompson Grayer, MD  09/26/2014 2:15 PM     [place] [address] [phone] (office) [phone] (fax)

## 2014-09-26 NOTE — Patient Instructions (Addendum)
Your physician wants you to follow-up in: 3 months with Roderic Palau NP.

## 2014-09-27 NOTE — Progress Notes (Signed)
Loop recorder 

## 2014-10-13 ENCOUNTER — Telehealth: Payer: Self-pay | Admitting: Internal Medicine

## 2014-10-13 NOTE — Telephone Encounter (Signed)
Patient is aware 

## 2014-10-13 NOTE — Telephone Encounter (Signed)
Discussed with [name] and with taking Eliquis this medication is less an ideal and he would not recommend if at all possible

## 2014-10-13 NOTE — Telephone Encounter (Signed)
New message      Pt c/o medication issue:  1. Name of Medication: meloxicam  2. How are you currently taking this medication (dosage and times per day)? 15mg  3. Are you having a reaction (difficulty breathing--STAT)?   4. What is your medication issue? Orthopedic doctor prescribed this medication.  It is ok to take this with her other medications

## 2014-10-21 ENCOUNTER — Encounter: Payer: Self-pay | Admitting: Internal Medicine

## 2014-10-24 ENCOUNTER — Ambulatory Visit (INDEPENDENT_AMBULATORY_CARE_PROVIDER_SITE_OTHER): Payer: PPO | Admitting: *Deleted

## 2014-10-24 DIAGNOSIS — I480 Paroxysmal atrial fibrillation: Secondary | ICD-10-CM | POA: Diagnosis not present

## 2014-10-24 LAB — MDC_IDC_ENUM_SESS_TYPE_REMOTE
Date Time Interrogation Session: 20160329013008
Zone Setting Detection Interval: 2000 ms
Zone Setting Detection Interval: 3000 ms
Zone Setting Detection Interval: 380 ms

## 2014-10-26 ENCOUNTER — Encounter: Payer: PPO | Admitting: Internal Medicine

## 2014-10-26 NOTE — Progress Notes (Signed)
Loop recorder 

## 2014-11-04 ENCOUNTER — Encounter: Payer: Self-pay | Admitting: Internal Medicine

## 2014-11-17 LAB — CUP PACEART REMOTE DEVICE CHECK
MDC IDC SESS DTM: 20160503061809
MDC IDC SET ZONE DETECTION INTERVAL: 3000 ms
Zone Setting Detection Interval: 2000 ms
Zone Setting Detection Interval: 380 ms

## 2014-11-23 ENCOUNTER — Ambulatory Visit (INDEPENDENT_AMBULATORY_CARE_PROVIDER_SITE_OTHER): Payer: PPO | Admitting: *Deleted

## 2014-11-23 DIAGNOSIS — I480 Paroxysmal atrial fibrillation: Secondary | ICD-10-CM

## 2014-11-25 NOTE — Progress Notes (Signed)
Loop recorder 

## 2014-12-01 ENCOUNTER — Encounter: Payer: Self-pay | Admitting: Internal Medicine

## 2014-12-06 LAB — CUP PACEART REMOTE DEVICE CHECK: Date Time Interrogation Session: 20160525040500

## 2014-12-07 ENCOUNTER — Ambulatory Visit (HOSPITAL_COMMUNITY): Payer: PPO | Admitting: Nurse Practitioner

## 2014-12-12 ENCOUNTER — Encounter: Payer: Self-pay | Admitting: Internal Medicine

## 2014-12-13 ENCOUNTER — Encounter (HOSPITAL_COMMUNITY): Payer: Self-pay | Admitting: Nurse Practitioner

## 2014-12-13 ENCOUNTER — Ambulatory Visit (HOSPITAL_COMMUNITY)
Admission: RE | Admit: 2014-12-13 | Discharge: 2014-12-13 | Disposition: A | Discharge status: Home / Self Care | Payer: PPO | Source: Ambulatory Visit | Attending: Nurse Practitioner | Admitting: Nurse Practitioner

## 2014-12-13 VITALS — BP 120/82 | HR 63 | Ht 64.5 in | Wt 170.8 lb

## 2014-12-13 DIAGNOSIS — I4891 Unspecified atrial fibrillation: Secondary | ICD-10-CM | POA: Insufficient documentation

## 2014-12-13 NOTE — Progress Notes (Signed)
Patient ID: Erika Lee, female   DOB: [birth year], 69 y.o.   MRN: [id number]       Date:  12/13/2014   ID:  Erika Lee, DOB 1945/08/29, MRN [id number]  PCP:  Alonza Bogus, MD   Primary Electrophysiologist:  Dr. Clearnce Hasten  Chief Complaint  Patient presents with  . Atrial Fibrillation     History of Present Illness: Erika Lee is a 69 y.o. female who presents today for electrophysiology evaluation.   She has done well since her ablation.  She continues to have very brief occasional palpitations  Continues on DOAC with a chads2vasc score of at least 4. She is very active in her garden. Today, she denies symptoms of chest pain , orthopnea, PND, lower extremity edema, claudication, dizziness, presyncope, syncope, bleeding, or neurologic sequela. The patient is tolerating medications without difficulties and is otherwise without complaint today.    Past Medical History  Diagnosis Date  . Hyperlipidemia   . Multinodular goiter   . Paroxysmal atrial fibrillation     s/p PVI by [name] 07/16/2013  . GERD (gastroesophageal reflux disease)   . Essential hypertension, benign   . Palpitations   . Diabetes mellitus     Borderline  . Arthritis   . Atrial flutter     s/p CTI by [name] 07/16/2013   Past Surgical History  Procedure Laterality Date  . Biopsy thyroid    . Tonsillectomy    . Colonoscopy  09/10/2011    Procedure: COLONOSCOPY;  Surgeon: Jamesetta So, MD;  [facility];  Service: Gastroenterology;  Laterality: N/A;  . Lesion removal N/A 05/03/2013    Procedure: EXCISION CYST, BACK;  Surgeon: Jamesetta So, MD;  Location: [facility] ORS;  Service: General;  Laterality: N/A;  . Tee without cardioversion N/A 07/15/2013    Procedure: TRANSESOPHAGEAL ECHOCARDIOGRAM (TEE);  Surgeon: Lelon Perla, MD;  Location: [facility] ENDOSCOPY;  Service: Cardiovascular;  Laterality: N/A;  . Atrial fibrillation ablation N/A 07/16/2013    PVI and CTI ablation by [name]  . Loop  recorder implant  07/26/14    MDT LINQ implanted by [name] for palpitations  . Loop recorder implant N/A 07/26/2014    Procedure: LOOP RECORDER IMPLANT;  Surgeon: Thompson Grayer, MD;  Location: [facility] CATH LAB;  Service: Cardiovascular;  Laterality: N/A;     Current Outpatient Prescriptions  Medication Sig Dispense Refill  . apixaban (ELIQUIS) 5 MG TABS tablet Take 1 tablet (5 mg total) by mouth 2 (two) times daily. 60 tablet 12  . cetirizine (ZYRTEC) 10 MG tablet Take 10 mg by mouth daily as needed for allergies.    Marland Kitchen esomeprazole (NEXIUM) 20 MG capsule Take 20 mg by mouth 2 (two) times daily before a meal.     . hydrochlorothiazide 25 MG tablet Take 25 mg by mouth daily.      Marland Kitchen HYDROcodone-acetaminophen (NORCO) 7.5-325 MG per tablet Take 1 tablet by mouth every 6 (six) hours as needed for moderate pain.    Marland Kitchen lisinopril (PRINIVIL,ZESTRIL) 10 MG tablet Take 10 mg by mouth daily.      . metFORMIN (GLUCOPHAGE) 500 MG tablet Take 1 tablet (500 mg total) by mouth daily.    . metoprolol succinate (TOPROL XL) 25 MG 24 hr tablet Take 0.5 tablets (12.5 mg total) by mouth daily. 60 tablet 3  . Potassium Gluconate 550 MG TABS Take 1 tablet (550 mg total) by mouth 2 (two) times daily. (Patient taking differently: Take  1 tablet by mouth daily. ) 30 each 0  . simvastatin (ZOCOR) 40 MG tablet Take 40 mg by mouth at bedtime.      . SSD 1 % cream Apply 1 application topically daily as needed. skin  12   No current facility-administered medications for this encounter.    Allergies:   Clindamycin/lincomycin; Doxycycline; Adhesive; Latex; and Sulfonamide derivatives   Social History:  The patient  reports that she quit smoking about 21 years ago. Her smoking use included Cigarettes. She has never used smokeless tobacco. She reports that she does not drink alcohol or use illicit drugs.   Family History:  The patient's family history includes Kidney disease in her maternal grandfather; Other in her maternal  grandfather; Stroke in her maternal grandmother; Stroke (age of onset: 14) in her mother.    ROS:  Please see the history of present illness.  She reports occasional back pain.   All other systems are reviewed and negative.    PHYSICAL EXAM: VS:  BP 120/82 mmHg  Pulse 63  Ht 5' 4.5" (1.638 m)  Wt 170 lb 12.8 oz (77.474 kg)  BMI 28.88 kg/m2 , BMI Body mass index is 28.88 kg/(m^2). GEN: Well nourished, well developed, in no acute distress HEENT: normal Neck: no JVD, carotid bruits, or masses Cardiac: RRR; no murmurs, rubs, or gallops,no edema  Respiratory:  clear to auscultation bilaterally, normal work of breathing GI: soft, nontender, nondistended, + BS MS: no deformity or atrophy Skin: warm and dry  Neuro:  Strength and sensation are intact Psych: euthymic mood, full affect ILR site is well healed  ILR ILR interrogation today is reviewed (see paceart)  Recent Labs: 08/16/2014: ALT 19; BUN 15; Creatinine, Ser 0.79; Hemoglobin 15.2*; Platelets 182; Potassium 3.8; Sodium 131*    Lipid Panel  No results found for: CHOL, TRIG, HDL, CHOLHDL, VLDL, LDLCALC, LDLDIRECT   Wt Readings from Last 3 Encounters:  12/13/14 170 lb 12.8 oz (77.474 kg)  09/26/14 172 lb 9.6 oz (78.291 kg)  08/16/14 170 lb (77.111 kg)     Remote Linq Report reviewed with one episode of afib x 2 mins from 4/25 to 5/25.   ASSESSMENT AND PLAN:  1.  afib Well controlled Compliance with eliquis is encouraged (chads2vasc score is at least 4).    2. HTN Stable, no change.   The patient does not have concerns regarding her medicines.  The following changes were made today:  none  Disposition:   FU Dr. Rayann Heman  as scheduled   Signed, ,, NP  12/13/2014 3:08 PM

## 2014-12-23 ENCOUNTER — Ambulatory Visit (INDEPENDENT_AMBULATORY_CARE_PROVIDER_SITE_OTHER): Payer: PPO | Admitting: *Deleted

## 2014-12-23 DIAGNOSIS — I480 Paroxysmal atrial fibrillation: Secondary | ICD-10-CM

## 2014-12-27 ENCOUNTER — Encounter: Payer: Self-pay | Admitting: Internal Medicine

## 2014-12-27 LAB — CUP PACEART REMOTE DEVICE CHECK: Date Time Interrogation Session: 20160628144233

## 2014-12-28 NOTE — Progress Notes (Signed)
Loop recorder 

## 2015-01-10 ENCOUNTER — Encounter: Payer: Self-pay | Admitting: Internal Medicine

## 2015-01-16 ENCOUNTER — Encounter: Payer: Self-pay | Admitting: *Deleted

## 2015-01-17 ENCOUNTER — Telehealth: Payer: Self-pay | Admitting: *Deleted

## 2015-01-17 ENCOUNTER — Encounter: Payer: Self-pay | Admitting: Obstetrics & Gynecology

## 2015-01-17 ENCOUNTER — Ambulatory Visit (INDEPENDENT_AMBULATORY_CARE_PROVIDER_SITE_OTHER): Payer: PPO | Admitting: Obstetrics & Gynecology

## 2015-01-17 VITALS — BP 130/70 | HR 68 | Ht 65.0 in | Wt 170.4 lb

## 2015-01-17 DIAGNOSIS — B3731 Acute candidiasis of vulva and vagina: Secondary | ICD-10-CM

## 2015-01-17 DIAGNOSIS — B373 Candidiasis of vulva and vagina: Secondary | ICD-10-CM

## 2015-01-17 MED ORDER — NYSTATIN-TRIAMCINOLONE 100000-0.1 UNIT/GM-% EX OINT: 1.0000 "application " | TOPICAL_OINTMENT | Freq: Two times a day (BID) | CUTANEOUS | Status: DC

## 2015-01-17 MED ORDER — FLUCONAZOLE 100 MG PO TABS: 100.0000 mg | ORAL_TABLET | Freq: Every day | ORAL | Status: DC

## 2015-01-17 NOTE — Telephone Encounter (Signed)
Per our conversation.

## 2015-01-17 NOTE — Telephone Encounter (Signed)
Erika Lee from [employer] pt insurance with not cover the Nystatin-Triamcinolone ointment as a combination drug but will cover individually. Please advise.

## 2015-01-17 NOTE — Telephone Encounter (Signed)
Per Dr. Elonda Husky, [facility] for [place] to prescribe the Nystatin and the Triamcinolone separately due to insurance not covering medication as a combination drug. Pharmacy notified.

## 2015-01-17 NOTE — Progress Notes (Signed)
Patient ID: Erika Lee, female   DOB: [birth year], 69 y.o.   MRN: [id number]   Chief Complaint  Patient presents with  . Referral    c/c itching/ burning/ pain ureter area.Marland Kitchen     HPI:    69 y.o. No obstetric history on file. No LMP recorded. Patient is postmenopausal.  Patient with symptoms 2 weeks, never had symptoms like this before, stay she hasn't had a yeast infection that she knows of in 10 years or so Does have problems with hemorrhoids and possibly rectal prolapse Location:  Vulvovaginal. Quality:  Burning irritation itchy. Severity:  Moderate to severe. Timing:  Constant. Duration:  2 weeks. Context:  Just finished course of Cipro. Modifying factors:  Has urine loss and wears pads chronically Signs/Symptoms:      Current outpatient prescriptions:  .  apixaban (ELIQUIS) 5 MG TABS tablet, Take 1 tablet (5 mg total) by mouth 2 (two) times daily., Disp: 60 tablet, Rfl: 12 .  cetirizine (ZYRTEC) 10 MG tablet, Take 10 mg by mouth daily as needed for allergies., Disp: , Rfl:  .  esomeprazole (NEXIUM) 20 MG capsule, Take 20 mg by mouth 2 (two) times daily before a meal. , Disp: , Rfl:  .  hydrochlorothiazide 25 MG tablet, Take 25 mg by mouth daily.  , Disp: , Rfl:  .  HYDROcodone-acetaminophen (NORCO) 7.5-325 MG per tablet, Take 1 tablet by mouth every 6 (six) hours as needed for moderate pain., Disp: , Rfl:  .  lisinopril (PRINIVIL,ZESTRIL) 10 MG tablet, Take 10 mg by mouth daily.  , Disp: , Rfl:  .  metFORMIN (GLUCOPHAGE) 500 MG tablet, Take 1 tablet (500 mg total) by mouth daily., Disp: , Rfl:  .  metoprolol succinate (TOPROL XL) 25 MG 24 hr tablet, Take 0.5 tablets (12.5 mg total) by mouth daily., Disp: 60 tablet, Rfl: 3 .  Potassium Gluconate 550 MG TABS, Take 1 tablet (550 mg total) by mouth 2 (two) times daily. (Patient taking differently: Take 1 tablet by mouth daily. ), Disp: 30 each, Rfl: 0 .  simvastatin (ZOCOR) 40 MG tablet, Take 40 mg by mouth at bedtime.  , Disp: ,  Rfl:  .  SSD 1 % cream, Apply 1 application topically daily as needed. skin, Disp: , Rfl: 12 .  fluconazole (DIFLUCAN) 100 MG tablet, Take 1 tablet (100 mg total) by mouth daily., Disp: 14 tablet, Rfl: 1 .  nystatin-triamcinolone ointment (MYCOLOG), Apply 1 application topically 2 (two) times daily., Disp: 30 g, Rfl: 11  Problem Pertinent ROS:       No burning with urination, frequency or urgency No nausea, vomiting or diarrhea Nor fever chills or other constitutional symptoms   Extended ROS:        [place]:             Past Medical History  Diagnosis Date  . Hyperlipidemia   . Multinodular goiter   . Paroxysmal atrial fibrillation     s/p PVI by [name] 07/16/2013  . GERD (gastroesophageal reflux disease)   . Essential hypertension, benign   . Palpitations   . Diabetes mellitus     Borderline  . Arthritis   . Atrial flutter     s/p CTI by [name] 07/16/2013    Past Surgical History  Procedure Laterality Date  . Biopsy thyroid    . Tonsillectomy    . Colonoscopy  09/10/2011    Procedure: COLONOSCOPY;  Surgeon: Jamesetta So, MD;  [facility];  Service: Gastroenterology;  Laterality: N/A;  . Lesion removal N/A 05/03/2013    Procedure: EXCISION CYST, BACK;  Surgeon: Jamesetta So, MD;  Location: [facility] ORS;  Service: General;  Laterality: N/A;  . Tee without cardioversion N/A 07/15/2013    Procedure: TRANSESOPHAGEAL ECHOCARDIOGRAM (TEE);  Surgeon: Lelon Perla, MD;  Location: [facility] ENDOSCOPY;  Service: Cardiovascular;  Laterality: N/A;  . Atrial fibrillation ablation N/A 07/16/2013    PVI and CTI ablation by [name]  . Loop recorder implant  07/26/14    MDT LINQ implanted by [name] for palpitations  . Loop recorder implant N/A 07/26/2014    Procedure: LOOP RECORDER IMPLANT;  Surgeon: Thompson Grayer, MD;  Location: [facility] CATH LAB;  Service: Cardiovascular;  Laterality: N/A;    OB History    No data available      Allergies  Allergen Reactions  .  Clindamycin/Lincomycin Rash  . Doxycycline Other (See Comments)    Makes heart race  . Adhesive [Tape] Rash  . Latex Rash  . Sulfonamide Derivatives Nausea And Vomiting    History   Social History  . Marital Status: Married    Spouse Name: N/A  . Number of Children: N/A  . Years of Education: N/A   Occupational History  . Trucking Business     Full time   Social History Main Topics  . Smoking status: Former Smoker    Types: Cigarettes    Quit date: 11/29/1993  . Smokeless tobacco: Never Used  . Alcohol Use: No  . Drug Use: No  . Sexual Activity: Not on file   Other Topics Concern  . None   Social History Narrative   Lives with spouse in [place]    Family History  Problem Relation Age of Onset  . Stroke Mother 53  . Stroke Maternal Grandmother     stroke  . Other Maternal Grandfather     "heart dropsy" kidney issues  . Kidney disease Maternal Grandfather      Examination:  Vitals:  Blood pressure 130/70, pulse 68, height 5\' 5"  (1.651 m), weight 170 lb 6.4 oz (77.293 kg).    Physical Examination:     General Appearance:  awake, alert, oriented, in no acute distress, well developed, well nourished and in no acute distress  Vulva:  Swollen red clitoris top of labia, very very sensitive to touch Vagina:  normal mucosa, no discharge Cervix:   Uterus:   Adnexa: ovaries:,       DATA orders and reviews: Labs were not ordered today:   Imaging studies were not ordered today:    Lab tests were not reviewed today:    Imaging studies were not reviewed today:    I did not independently review/view images, tracing or specimen(not simply the report) myself.  Prescription Drug Management:  New Prescriptions: diflucan x 2 weeks, mytrex Renewed Prescriptions:   Current prescription changes:     Impression/Plan(Problem Based): 1.  Vulvar irritation, yeast vs LSA      (new problem) : Additional workup is needed:  Treatment and follow up     Follow Up:    10  days

## 2015-01-23 ENCOUNTER — Ambulatory Visit (INDEPENDENT_AMBULATORY_CARE_PROVIDER_SITE_OTHER): Payer: PPO | Admitting: *Deleted

## 2015-01-23 DIAGNOSIS — I480 Paroxysmal atrial fibrillation: Secondary | ICD-10-CM | POA: Diagnosis not present

## 2015-01-23 LAB — CUP PACEART REMOTE DEVICE CHECK: MDC IDC SESS DTM: 20160812170553

## 2015-01-27 ENCOUNTER — Ambulatory Visit (INDEPENDENT_AMBULATORY_CARE_PROVIDER_SITE_OTHER): Payer: PPO | Admitting: Obstetrics & Gynecology

## 2015-01-27 ENCOUNTER — Encounter: Payer: Self-pay | Admitting: Obstetrics & Gynecology

## 2015-01-27 VITALS — BP 130/80 | HR 76 | Wt 171.0 lb

## 2015-01-27 DIAGNOSIS — K623 Rectal prolapse: Secondary | ICD-10-CM

## 2015-01-27 DIAGNOSIS — B3731 Acute candidiasis of vulva and vagina: Secondary | ICD-10-CM

## 2015-01-27 DIAGNOSIS — B373 Candidiasis of vulva and vagina: Secondary | ICD-10-CM | POA: Diagnosis not present

## 2015-01-27 NOTE — Progress Notes (Signed)
Patient ID: Erika Lee, female   DOB: Feb 18, 1946, 69 y.o.   MRN: [id number] Chief Complaint  Patient presents with  . Follow-up    Blood pressure 130/80, pulse 76, weight 171 lb (77.565 kg).  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 I was unsure she had some other secondary infection going on with this time Treated her with oral Diflucan and topical Mytrex  She is much much better her symptoms are 80-90% better she states   Objective Vulva still some white changes but the inflammation and swollen clitoris is dramatically better think her entire presentation was due to a heavy inflammatory response to her yeast vulvitis no evidence of any infection a day although she still does have some changes that may be consistent with lichen sclerosus atrophicus she will continue use a topical Mytrex  Pertinent ROS No burning with urination, frequency or urgency No nausea, vomiting or diarrhea Nor fever chills or other constitutional symptoms   Labs or studies   Impression    Established relevant diagnosis(es): 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  Rectal prolapse  Plan/Recommendations Continue the Mytrex Finish her Diflucan See her back in 1 month to see her symptoms are doing and decide whether or not she needs chronic treatment for lichen sclerosus  Additionally her rectal prolapse is severe and I can't tell if she's got a hemorrhoid or even a rectal malignancy she is been very hesitant to have any follow-up with the issue also given her [place] [facility] number and she is undecided to see over there Told her I had a next dictation  that she would be seen by one of them prior to her next visit  Follow up 1 month         All questions were answered.

## 2015-02-09 NOTE — Progress Notes (Signed)
Loop recorder 

## 2015-02-16 ENCOUNTER — Encounter: Payer: Self-pay | Admitting: Internal Medicine

## 2015-02-21 ENCOUNTER — Ambulatory Visit (INDEPENDENT_AMBULATORY_CARE_PROVIDER_SITE_OTHER): Payer: PPO | Admitting: *Deleted

## 2015-02-21 DIAGNOSIS — I480 Paroxysmal atrial fibrillation: Secondary | ICD-10-CM | POA: Diagnosis not present

## 2015-02-22 NOTE — Progress Notes (Signed)
Loop recorder 

## 2015-02-27 ENCOUNTER — Ambulatory Visit: Payer: PPO | Admitting: Obstetrics & Gynecology

## 2015-03-23 ENCOUNTER — Ambulatory Visit (INDEPENDENT_AMBULATORY_CARE_PROVIDER_SITE_OTHER): Payer: PPO | Admitting: *Deleted

## 2015-03-23 DIAGNOSIS — I480 Paroxysmal atrial fibrillation: Secondary | ICD-10-CM | POA: Diagnosis not present

## 2015-03-27 NOTE — Progress Notes (Signed)
Loop recorder 

## 2015-03-29 LAB — CUP PACEART REMOTE DEVICE CHECK: Date Time Interrogation Session: 20160823143618

## 2015-03-29 NOTE — Progress Notes (Signed)
Carelink summary report received. Battery status OK. Normal device function. No new symptom episodes, tachy episodes, brady episodes. 1 pause episode---undersensing. No new AF episodes. Monthly summary reports and ROV w/ JA 04/03/15.

## 2015-04-03 ENCOUNTER — Ambulatory Visit (INDEPENDENT_AMBULATORY_CARE_PROVIDER_SITE_OTHER): Payer: PPO | Admitting: Internal Medicine

## 2015-04-03 ENCOUNTER — Encounter: Payer: Self-pay | Admitting: Internal Medicine

## 2015-04-03 ENCOUNTER — Other Ambulatory Visit: Payer: Self-pay

## 2015-04-03 ENCOUNTER — Telehealth: Payer: Self-pay | Admitting: *Deleted

## 2015-04-03 VITALS — BP 128/80 | HR 49 | Ht 65.0 in | Wt 172.8 lb

## 2015-04-03 DIAGNOSIS — I4891 Unspecified atrial fibrillation: Secondary | ICD-10-CM | POA: Diagnosis not present

## 2015-04-03 DIAGNOSIS — I495 Sick sinus syndrome: Secondary | ICD-10-CM | POA: Diagnosis not present

## 2015-04-03 LAB — CUP PACEART REMOTE DEVICE CHECK: MDC IDC SESS DTM: 20160922150535

## 2015-04-03 NOTE — Progress Notes (Signed)
Carelink summary report received. Battery status OK. Normal device function. No new symptom episodes, tachy episodes, brady, or pause episodes. No new AF episodes. Monthly summary reports and ROV w/ JA 04/03/15.

## 2015-04-03 NOTE — Patient Instructions (Signed)
Medication Instructions:  Your physician recommends that you continue on your current medications as directed. Please refer to the Current Medication list given to you today.   Labwork: None ordered   Testing/Procedures: None ordered   Follow-Up: Your physician recommends that you schedule a follow-up appointment in: 3 months with Roderic Palau, NP  Dr Kristeen Miss---   Any Other Special Instructions Will Be Listed Below (If Applicable).

## 2015-04-03 NOTE — Telephone Encounter (Signed)
[facility] requesting call back.  Need to instruct patient to [facility] monitor so that she does not automatically send monthly summary reports.

## 2015-04-03 NOTE — Progress Notes (Signed)
Electrophysiology Office Note   Date:  04/03/2015   ID:  Erika Lee, DOB [birth year], MRN [id number]  PCP:  Alonza Bogus, MD   Primary Electrophysiologist:  Thompson Grayer, MD    Chief Complaint  Patient presents with  . PAF     History of Present Illness: Erika Lee is a 68 y.o. female who presents today for electrophysiology evaluation.    She is doing well with only rare palpitations.  Her primary complaint today is chronic lower backpain.  Today, she denies symptoms of chest pain, , orthopnea, PND, lower extremity edema, claudication, dizziness, presyncope, syncope, bleeding, or neurologic sequela. The patient is tolerating medications without difficulties and is otherwise without complaint today.    Past Medical History  Diagnosis Date  . Hyperlipidemia   . Multinodular goiter   . Paroxysmal atrial fibrillation (HCC)     s/p PVI by [name] 07/16/2013  . GERD (gastroesophageal reflux disease)   . Essential hypertension, benign   . Palpitations   . Diabetes mellitus     Borderline  . Arthritis   . Atrial flutter [facility])     s/p CTI by [name] 07/16/2013   Past Surgical History  Procedure Laterality Date  . Biopsy thyroid    . Tonsillectomy    . Colonoscopy  09/10/2011    Procedure: COLONOSCOPY;  Surgeon: Jamesetta So, MD;  [facility];  Service: Gastroenterology;  Laterality: N/A;  . Lesion removal N/A 05/03/2013    Procedure: EXCISION CYST, BACK;  Surgeon: Jamesetta So, MD;  Location: [facility] ORS;  Service: General;  Laterality: N/A;  . Tee without cardioversion N/A 07/15/2013    Procedure: TRANSESOPHAGEAL ECHOCARDIOGRAM (TEE);  Surgeon: Lelon Perla, MD;  Location: [facility] ENDOSCOPY;  Service: Cardiovascular;  Laterality: N/A;  . Atrial fibrillation ablation N/A 07/16/2013    PVI and CTI ablation by [name]  . Loop recorder implant  07/26/14    MDT LINQ implanted by [name] for palpitations  . Loop recorder implant N/A 07/26/2014    Procedure:  LOOP RECORDER IMPLANT;  Surgeon: Thompson Grayer, MD;  Location: [facility] CATH LAB;  Service: Cardiovascular;  Laterality: N/A;     Current Outpatient Prescriptions  Medication Sig Dispense Refill  . apixaban (ELIQUIS) 5 MG TABS tablet Take 1 tablet (5 mg total) by mouth 2 (two) times daily. 60 tablet 12  . azithromycin (ZITHROMAX) 250 MG tablet Take 1 tablet by mouth as directed.  0  . esomeprazole (NEXIUM) 20 MG capsule Take 20 mg by mouth daily.     . hydrochlorothiazide 25 MG tablet Take 25 mg by mouth daily.      Marland Kitchen HYDROcodone-acetaminophen (NORCO) 7.5-325 MG per tablet Take 1 tablet by mouth every 6 (six) hours as needed for moderate pain.    Marland Kitchen lisinopril (PRINIVIL,ZESTRIL) 10 MG tablet Take 10 mg by mouth daily.      Marland Kitchen loratadine (CLARITIN) 10 MG tablet Take 10 mg by mouth daily as needed for allergies.    . metFORMIN (GLUCOPHAGE) 500 MG tablet Take 1 tablet (500 mg total) by mouth daily.    . metoprolol succinate (TOPROL XL) 25 MG 24 hr tablet Take 0.5 tablets (12.5 mg total) by mouth daily. 60 tablet 3  . Potassium Gluconate 550 MG TABS Take 1 tablet by mouth daily.    . simvastatin (ZOCOR) 40 MG tablet Take 40 mg by mouth at bedtime.      . SSD 1 % cream Apply 1 application  topically daily as needed. skin  12   No current facility-administered medications for this visit.    Allergies:   Clindamycin/lincomycin; Doxycycline; Adhesive; Latex; and Sulfonamide derivatives   Social History:  The patient  reports that she quit smoking about 21 years ago. Her smoking use included Cigarettes. She has never used smokeless tobacco. She reports that she does not drink alcohol or use illicit drugs.   Family History:  The patient's family history includes Kidney disease in her maternal grandfather; Other in her maternal grandfather; Stroke in her maternal grandmother; Stroke (age of onset: 61) in her mother.    ROS:  Please see the history of present illness.  She reports occasional back pain.   All  other systems are reviewed and negative.    PHYSICAL EXAM: VS:  BP 128/80 mmHg  Pulse 49  Ht 5\' 5"  (1.651 m)  Wt 172 lb 12.8 oz (78.382 kg)  BMI 28.76 kg/m2 , BMI Body mass index is 28.76 kg/(m^2). GEN: Well nourished, well developed, in no acute distress HEENT: normal Neck: no JVD, carotid bruits, or masses Cardiac: RRR; no murmurs, rubs, or gallops,no edema  Respiratory:  clear to auscultation bilaterally, normal work of breathing GI: soft, nontender, nondistended, + BS MS: no deformity or atrophy Skin: warm and dry  Neuro:  Strength and sensation are intact Psych: euthymic mood, full affect ILR site is well healed  ILR ILR interrogation today is reviewed (see paceart)  Recent Labs: 08/16/2014: ALT 19; BUN 15; Creatinine, Ser 0.79; Hemoglobin 15.2*; Platelets 182; Potassium 3.8; Sodium 131*    Lipid Panel  No results found for: CHOL, TRIG, HDL, CHOLHDL, VLDL, LDLCALC, LDLDIRECT   Wt Readings from Last 3 Encounters:  04/03/15 172 lb 12.8 oz (78.382 kg)  01/27/15 171 lb (77.565 kg)  01/17/15 170 lb 6.4 oz (77.293 kg)    ekg today reveals sinus bradycardia 49 bpm, blocked pACs noted otherwise normal ekg  ASSESSMENT AND PLAN:  1.  afib Well controlled ILR interrogation reveals only 2 episodes of afib since January, (56 minutes 11/22/14 and 2 hours 32 minutes 10/10/14) Compliance with eliquis is encouraged (chads2vasc score is at least 4).    2. HTN Stable, no change.  3. noctural pauses asymptomatic  Current medicines are reviewed at length with the patient today.   The patient does not have concerns regarding her medicines.  The following changes were made today:  none  Disposition:   FU with Roderic Palau NP every  3 months in the AF clinic.  I will see when needed going forward  Due to costs, she has requested that we not follow in Carelink but only interrogate her ILR in the office.   Army Fossa, MD  04/03/2015 2:46 PM     [address] [place]  [id number] [phone] (office) [phone] (fax)

## 2015-04-04 LAB — CUP PACEART INCLINIC DEVICE CHECK
MDC IDC SESS DTM: 20161003175121
Zone Setting Detection Interval: 2000 ms
Zone Setting Detection Interval: 380 ms
Zone Setting Detection Interval: 4500 ms

## 2015-04-04 NOTE — Telephone Encounter (Signed)
Called patient and she states that she and her husband have "talked it over" and she states that she would like to continue monthly Carelink monitoring at this time.  She states that "it's worth $20/month to me" and that she would "figure out" how to pay her other bills.  Patient states that she is going to call her insurance company this week to ensure that they are billing/paying correctly.  Encouraged patient to let our device clinic know in the future if she decides to discontinue monitoring so that she does not receive disconnected monitor calls/letters.  Patient voices understanding of all instructions and denies any additional questions or concerns at this time.

## 2015-04-20 ENCOUNTER — Encounter: Payer: Self-pay | Admitting: Internal Medicine

## 2015-04-21 ENCOUNTER — Encounter: Payer: Self-pay | Admitting: Internal Medicine

## 2015-05-03 ENCOUNTER — Ambulatory Visit (INDEPENDENT_AMBULATORY_CARE_PROVIDER_SITE_OTHER): Payer: PPO | Admitting: *Deleted

## 2015-05-03 DIAGNOSIS — I4891 Unspecified atrial fibrillation: Secondary | ICD-10-CM | POA: Diagnosis not present

## 2015-05-03 DIAGNOSIS — R002 Palpitations: Secondary | ICD-10-CM | POA: Diagnosis not present

## 2015-05-05 NOTE — Progress Notes (Signed)
Carelink Summary Report/loop recorder 

## 2015-05-30 ENCOUNTER — Telehealth: Payer: Self-pay | Admitting: Internal Medicine

## 2015-05-30 NOTE — Telephone Encounter (Signed)
Needs an MD for her husband.  He has swollen legs that are red and itchy and needs to be seen.  I have advised her to contact the PCP he has been going to and follow up as it could be a while before we can get him in to a new PCP here in [place].  Wants to get into see [name] in Jan.  Says he has been told twice he has afib.  MD at [facility] when he had a colonoscopy and once at [facility] physical. I let her know I would talk to [name] tomorrow and call her back but advised to call his PCP today to get in ASAP.

## 2015-05-30 NOTE — Telephone Encounter (Signed)
New problem   Pt need to speak to your concerning the concern she spoke to you about yesterday.

## 2015-05-30 NOTE — Telephone Encounter (Signed)
Walk in pt form-needs call back about recommendations for Family Doc-gave to [employer]

## 2015-06-02 ENCOUNTER — Ambulatory Visit (INDEPENDENT_AMBULATORY_CARE_PROVIDER_SITE_OTHER): Payer: PPO | Admitting: *Deleted

## 2015-06-02 DIAGNOSIS — I4891 Unspecified atrial fibrillation: Secondary | ICD-10-CM

## 2015-06-02 LAB — CUP PACEART REMOTE DEVICE CHECK: Date Time Interrogation Session: 20161202190744

## 2015-06-02 NOTE — Telephone Encounter (Deleted)
Discussed with [name] and he says he would recommend Dr Eilleen Kempf.  I have let the patient know and will send referral

## 2015-06-02 NOTE — Telephone Encounter (Signed)
Discussed with [name] and he says he would recommend Dr Scarlette Calico. As her husband is not our patient, I can not make the referral.  I have spoken with wife and gave her all the contact information for MD.  She is going to call

## 2015-06-04 LAB — CUP PACEART REMOTE DEVICE CHECK: MDC IDC SESS DTM: 20161102190640

## 2015-06-04 NOTE — Progress Notes (Signed)
Carelink summary report received. Battery status OK. Normal device function. No new symptom episodes, tachy episodes, brady, or pause episodes. 5 AF episodes, +Eliquis and metoprolol, avg V rate relatively controlled. Monthly summary reports and ROV with AF clinic on 07/04/15 at 2:00pm.

## 2015-06-05 NOTE — Progress Notes (Signed)
Carelink Summary Report / Loop Recorder 

## 2015-06-09 NOTE — Progress Notes (Signed)
Carelink summary report received. Battery status OK. Normal device function. No new symptom episodes, tachy episodes, brady, or pause episodes. No new AF episodes. Monthly summary reports and ROV 07/04/15 w/afib clinic

## 2015-07-04 ENCOUNTER — Ambulatory Visit (HOSPITAL_COMMUNITY)
Admission: RE | Admit: 2015-07-04 | Discharge: 2015-07-04 | Disposition: A | Discharge status: Home / Self Care | Payer: PPO | Source: Ambulatory Visit | Attending: Nurse Practitioner | Admitting: Nurse Practitioner

## 2015-07-04 ENCOUNTER — Ambulatory Visit (INDEPENDENT_AMBULATORY_CARE_PROVIDER_SITE_OTHER): Payer: PPO | Admitting: *Deleted

## 2015-07-04 ENCOUNTER — Encounter (HOSPITAL_COMMUNITY): Payer: Self-pay | Admitting: Nurse Practitioner

## 2015-07-04 VITALS — BP 142/84 | HR 49 | Ht 65.0 in | Wt 173.6 lb

## 2015-07-04 DIAGNOSIS — I4891 Unspecified atrial fibrillation: Secondary | ICD-10-CM

## 2015-07-04 NOTE — Addendum Note (Signed)
Encounter addended by: Sherran Needs, NP on: 07/04/2015  4:35 PM<BR>     Documentation filed: Notes Section

## 2015-07-04 NOTE — Progress Notes (Addendum)
Patient ID: Erika Lee, female   DOB: January 09, 1946, 70 y.o.   MRN: [id number]     Primary Care Physician: Alonza Bogus, MD Referring Physician: Dr. Talbot Grumbling Erika Lee is a 70 y.o. female with a h/o afib s/p ablation, that is in afib clinic for f/u. She reports that she is doing well without any symptoms of afib. No bleeding issues with apixaban. Overall, she is feeling well. Heart rate at home upper 40's to mid 50's. Not symptomatic with low heart rate.  Today, she denies symptoms of palpitations, chest pain, shortness of breath, orthopnea, PND, lower extremity edema, dizziness, presyncope, syncope, or neurologic sequela. The patient is tolerating medications without difficulties and is otherwise without complaint today.   Past Medical History  Diagnosis Date  . Hyperlipidemia   . Multinodular goiter   . Paroxysmal atrial fibrillation (HCC)     s/p PVI by [name] 07/16/2013  . GERD (gastroesophageal reflux disease)   . Essential hypertension, benign   . Palpitations   . Diabetes mellitus     Borderline  . Arthritis   . Atrial flutter [facility])     s/p CTI by [name] 07/16/2013   Past Surgical History  Procedure Laterality Date  . Biopsy thyroid    . Tonsillectomy    . Colonoscopy  09/10/2011    Procedure: COLONOSCOPY;  Surgeon: Jamesetta So, MD;  Location: [facility] ENDO SUITE;  Service: Gastroenterology;  Laterality: N/A;  . Lesion removal N/A 05/03/2013    Procedure: EXCISION CYST, BACK;  Surgeon: Jamesetta So, MD;  Location: [facility] ORS;  Service: General;  Laterality: N/A;  . Tee without cardioversion N/A 07/15/2013    Procedure: TRANSESOPHAGEAL ECHOCARDIOGRAM (TEE);  Surgeon: Lelon Perla, MD;  Location: [facility] ENDOSCOPY;  Service: Cardiovascular;  Laterality: N/A;  . Atrial fibrillation ablation N/A 07/16/2013    PVI and CTI ablation by [name]  . Loop recorder implant  07/26/14    MDT LINQ implanted by [name] for palpitations  . Loop recorder implant N/A 07/26/2014   Procedure: LOOP RECORDER IMPLANT;  Surgeon: Thompson Grayer, MD;  Location: [facility] CATH LAB;  Service: Cardiovascular;  Laterality: N/A;    Current Outpatient Prescriptions  Medication Sig Dispense Refill  . apixaban (ELIQUIS) 5 MG TABS tablet Take 1 tablet (5 mg total) by mouth 2 (two) times daily. 60 tablet 12  . esomeprazole (NEXIUM) 20 MG capsule Take 20 mg by mouth daily.     . hydrochlorothiazide 25 MG tablet Take 25 mg by mouth daily.      Marland Kitchen HYDROcodone-acetaminophen (NORCO) 7.5-325 MG per tablet Take 1 tablet by mouth every 6 (six) hours as needed for moderate pain.    Marland Kitchen lisinopril (PRINIVIL,ZESTRIL) 10 MG tablet Take 10 mg by mouth daily.      Marland Kitchen loratadine (CLARITIN) 10 MG tablet Take 10 mg by mouth daily as needed for allergies.    . metFORMIN (GLUCOPHAGE) 500 MG tablet Take 1 tablet (500 mg total) by mouth daily.    . metoprolol succinate (TOPROL XL) 25 MG 24 hr tablet Take 0.5 tablets (12.5 mg total) by mouth daily. 60 tablet 3  . Potassium Gluconate 550 MG TABS Take 1 tablet by mouth daily.    . simvastatin (ZOCOR) 40 MG tablet Take 40 mg by mouth at bedtime.      . SSD 1 % cream Apply 1 application topically daily as needed. skin  12   No current facility-administered medications for this encounter.  Allergies  Allergen Reactions  . Clindamycin/Lincomycin Rash  . Doxycycline Other (See Comments)    Makes heart race  . Adhesive [Tape] Rash  . Latex Rash  . Sulfonamide Derivatives Nausea And Vomiting    Social History   Social History  . Marital Status: Married    Spouse Name: N/A  . Number of Children: N/A  . Years of Education: N/A   Occupational History  . Trucking Business     Full time   Social History Main Topics  . Smoking status: Former Smoker    Types: Cigarettes    Quit date: 11/29/1993  . Smokeless tobacco: Never Used  . Alcohol Use: No  . Drug Use: No  . Sexual Activity: Not on file   Other Topics Concern  . Not on file   Social History  Narrative   Lives with spouse in [place]    Family History  Problem Relation Age of Onset  . Stroke Mother 2  . Stroke Maternal Grandmother     stroke  . Other Maternal Grandfather     "heart dropsy" kidney issues  . Kidney disease Maternal Grandfather     ROS- All systems are reviewed and negative except as per the HPI above  Physical Exam: Filed Vitals:   07/04/15 1414  BP: 142/84  Pulse: 49  Height: 5\' 5"  (1.651 m)  Weight: 173 lb 9.6 oz (78.744 kg)    GEN- The patient is well appearing, alert and oriented x 3 today.   Head- normocephalic, atraumatic Eyes-  Sclera clear, conjunctiva pink Ears- hearing intact Oropharynx- clear Neck- supple, no JVP Lymph- no cervical lymphadenopathy Lungs- Clear to ausculation bilaterally, normal work of breathing Heart- Regular rate and rhythm, no murmurs, rubs or gallops, PMI not laterally displaced GI- soft, NT, ND, + BS Extremities- no clubbing, cyanosis, or edema MS- no significant deformity or atrophy Skin- no rash or lesion Psych- euthymic mood, full affect Neuro- strength and sensation are intact  EKG- Sinus brady at 48 bpm. Pr int 178 ms, qtrs 94 ms, qtc 404 ms.  Assessment and Plan: 1. afib In SR s/p abaltion Doing well. Continue metoprolol watching for symptomatic brady Continue apixaban  Recheck in 6 months.  Geroge Baseman , [facility] [address] [place], [place] [address] [phone]

## 2015-07-04 NOTE — Progress Notes (Signed)
Carelink Summary Report / Loop Recorder 

## 2015-07-11 DIAGNOSIS — Z1211 Encounter for screening for malignant neoplasm of colon: Secondary | ICD-10-CM | POA: Diagnosis not present

## 2015-07-24 ENCOUNTER — Other Ambulatory Visit: Payer: Self-pay | Admitting: Surgery

## 2015-07-24 DIAGNOSIS — K642 Third degree hemorrhoids: Secondary | ICD-10-CM | POA: Diagnosis not present

## 2015-07-24 DIAGNOSIS — K643 Fourth degree hemorrhoids: Secondary | ICD-10-CM | POA: Diagnosis not present

## 2015-07-24 DIAGNOSIS — K644 Residual hemorrhoidal skin tags: Secondary | ICD-10-CM | POA: Diagnosis not present

## 2015-07-24 NOTE — H&P (Signed)
Erika Lee. Shiflet 07/24/2015 3:27 PM Location: [facility] Patient #: [id number] DOB: 1946/01/08 Married / Language: Cleophus Molt / Race: White Female  History of Present Illness Adin Hector MD; 07/24/2015 5:21 PM) Patient words: New.  The patient is a 70 year old female who presents with hemorrhoids. Note for "Hemorrhoids": Patient comes for a second opinion over concern of hemorrhoids. Sent for surgical consultation by her primary care physician, Dr. Sinda Du.  Pleasant woman that is struggle with hemorrhoids ever since she had her children decades ago. Usually has a bowel movement every 2-3 days. Had a colonoscopy 2013 that was otherwise normal. She's noticed some hemorrhoidal intermittently pop out. Often bleeding. Sometimes irritated. Gradually worsened. She feels them out all the time. Has to wear a pad because she has chronic leakage. Frustrating. There was concerned perhaps she had some type of rectal prolapse or rectocele. She was offered hemorrhoidectomy in the office by a different surgeon. She did not wish to try that. She is on Eliquis anticoagulation for history of recurrent atrial fibrillation and flutter. She's had ablation of I believe flutter. Say pacemaker out of sorts as well. She is followed by [facility] cardiology group. Dr. Rayann Heman   Other Problems Malachi Bonds, CMA; 07/24/2015 3:27 PM) Arthritis Back Pain Diabetes Mellitus Gastroesophageal Reflux Disease Hemorrhoids High blood pressure Hypercholesterolemia  Past Surgical History Malachi Bonds, CMA; 07/24/2015 3:27 PM) Tonsillectomy  Diagnostic Studies History Malachi Bonds, CMA; 07/24/2015 3:27 PM) Colonoscopy 1-5 years ago Mammogram 1-3 years ago Pap Smear 1-5 years ago  Allergies Malachi Bonds, CMA; 07/24/2015 3:29 PM) Clindamycin HCl *ANTI-INFECTIVE AGENTS - MISC.* Doxycycline Hyclate *TETRACYCLINES* Adhesive Tape *MEDICAL DEVICES AND  SUPPLIES* Latex  Medication History Malachi Bonds, CMA; 07/24/2015 3:32 PM) Eliquis (5MG  Tablet, Oral) Active. Hydrocodone-Acetaminophen (7.5-325MG  Tablet, Oral) Active. HydroCHLOROthiazide (25MG  Tablet, Oral) Active. Lisinopril (10MG  Tablet, Oral) Active. MetFORMIN HCl (500MG  Tablet, Oral) Active. Metoprolol Succinate ER (25MG  Tablet ER 24HR, Oral) Active. Simvastatin (40MG  Tablet, Oral) Active. Potassium Gluconate (550MG  Tablet, Oral) Active. Medications Reconciled  Social History Malachi Bonds, CMA; 07/24/2015 3:27 PM) Alcohol use Remotely quit alcohol use. No caffeine use No drug use Tobacco use Former smoker.  Family History Malachi Bonds, [place]; 07/24/2015 3:27 PM) Family history unknown First Degree Relatives  Pregnancy / Birth History Malachi Bonds, CMA; 07/24/2015 3:27 PM) Age at menarche 89 years. Age of menopause <43 Gravida 2 Maternal age 25-20 Para 2     Review of Systems Malachi Bonds CMA; 07/24/2015 3:27 PM) General Not Present- Appetite Loss, Chills, Fatigue, Fever, Night Sweats, Weight Gain and Weight Loss. Skin Not Present- Change in Wart/Mole, Dryness, Hives, Jaundice, New Lesions, Non-Healing Wounds, Rash and Ulcer. HEENT Present- Seasonal Allergies and Wears glasses/contact lenses. Not Present- Earache, Hearing Loss, Hoarseness, Nose Bleed, Oral Ulcers, Ringing in the Ears, Sinus Pain, Sore Throat, Visual Disturbances and Yellow Eyes. Respiratory Not Present- Bloody sputum, Chronic Cough, Difficulty Breathing, Snoring and Wheezing. Breast Not Present- Breast Mass, Breast Pain, Nipple Discharge and Skin Changes. Cardiovascular Not Present- Chest Pain, Difficulty Breathing Lying Down, Leg Cramps, Palpitations, Rapid Heart Rate, Shortness of Breath and Swelling of Extremities. Gastrointestinal Present- Hemorrhoids. Not Present- Abdominal Pain, Bloating, Bloody Stool, Change in Bowel Habits, Chronic diarrhea, Constipation, Difficulty  Swallowing, Excessive gas, Gets full quickly at meals, Indigestion, Nausea, Rectal Pain and Vomiting. Female Genitourinary Not Present- Frequency, Nocturia, Painful Urination, Pelvic Pain and Urgency. Musculoskeletal Present- Back Pain and Joint Pain. Not Present- Joint Stiffness, Muscle Pain, Muscle Weakness and Swelling of Extremities. Neurological Not  Present- Decreased Memory, Fainting, Headaches, Numbness, Seizures, Tingling, Tremor, Trouble walking and Weakness. Psychiatric Not Present- Anxiety, Bipolar, Change in Sleep Pattern, Depression, Fearful and Frequent crying. Endocrine Not Present- Cold Intolerance, Excessive Hunger, Hair Changes, Heat Intolerance, Hot flashes and New Diabetes. Hematology Not Present- Easy Bruising, Excessive bleeding, Gland problems, HIV and Persistent Infections.  Vitals ([name] CMA; 07/24/2015 3:28 PM) 07/24/2015 3:28 PM Weight: 177.6 lb Height: 65in Body Surface Area: 1.88 m Body Mass Index: 29.55 kg/m  Temp.: 98.36F(Oral)  BP: 124/72 (Sitting, Left Arm, Standard)      Physical Exam Adin Hector MD; 07/24/2015 4:34 PM)  General Mental Status-Alert. General Appearance-Not in acute distress, Not Sickly. Orientation-Oriented X3. Hydration-Well hydrated. Voice-Normal.  Integumentary Global Assessment Upon inspection and palpation of skin surfaces of the - Axillae: non-tender, no inflammation or ulceration, no drainage. and Distribution of scalp and body hair is normal. General Characteristics Temperature - normal warmth is noted.  Head and Neck Head-normocephalic, atraumatic with no lesions or palpable masses. Face Global Assessment - atraumatic, no absence of expression. Neck Global Assessment - no abnormal movements, no bruit auscultated on the right, no bruit auscultated on the left, no decreased range of motion, non-tender. Trachea-midline. Thyroid Gland Characteristics - non-tender.  Eye Eyeball -  Left-Extraocular movements intact, No Nystagmus. Eyeball - Right-Extraocular movements intact, No Nystagmus. Cornea - Left-No Hazy. Cornea - Right-No Hazy. Sclera/Conjunctiva - Left-No scleral icterus, No Discharge. Sclera/Conjunctiva - Right-No scleral icterus, No Discharge. Pupil - Left-Direct reaction to light normal. Pupil - Right-Direct reaction to light normal.  ENMT Ears Pinna - Left - no drainage observed, no generalized tenderness observed. Right - no drainage observed, no generalized tenderness observed. Nose and Sinuses External Inspection of the Nose - no destructive lesion observed. Inspection of the nares - Left - quiet respiration. Right - quiet respiration. Mouth and Throat Lips - Upper Lip - no fissures observed, no pallor noted. Lower Lip - no fissures observed, no pallor noted. Nasopharynx - no discharge present. Oral Cavity/Oropharynx - Tongue - no dryness observed. Oral Mucosa - no cyanosis observed. Hypopharynx - no evidence of airway distress observed.  Chest and Lung Exam Inspection Movements - Normal and Symmetrical. Accessory muscles - No use of accessory muscles in breathing. Palpation Palpation of the chest reveals - Non-tender. Auscultation Breath sounds - Normal and Clear.  Cardiovascular Auscultation Rhythm - Regular. Murmurs & Other Heart Sounds - Auscultation of the heart reveals - No Murmurs and No Systolic Clicks.  Abdomen Inspection Inspection of the abdomen reveals - No Visible peristalsis and No Abnormal pulsations. Umbilicus - No Bleeding, No Urine drainage. Palpation/Percussion Palpation and Percussion of the abdomen reveal - Soft, Non Tender, No Rebound tenderness, No Rigidity (guarding) and No Cutaneous hyperesthesia. Note: Soft. Nondistended non tender. No diastases. No umbilical hernia.  Female Genitourinary Sexual Maturity Tanner 5 - Adult hair pattern. Note: No vaginal bleeding nor discharge  Rectal Note: Exam  done with assistance of female Medical Assistant in the room.   Chronic prolapsed friable large RIGHT anterior hemorrhoid. RIGHT anterior pile. Significant external hemorrhoids times 3 piles.   Perianal skin clean with good hygiene. No pruritis ani. No pilonidal disease. No fissure. No abscess/fistula. Normal sphincter tone. Tolerates digital and anoscopic rectal exam. No rectal masses.  Peripheral Vascular Upper Extremity Inspection - Left - No Cyanotic nailbeds, Not Ischemic. Right - No Cyanotic nailbeds, Not Ischemic.  Neurologic Neurologic evaluation reveals -normal attention span and ability to concentrate, able to name objects and repeat phrases.  Appropriate fund of knowledge , normal sensation and normal coordination. Mental Status Affect - not angry, not paranoid. Cranial Nerves-Normal Bilaterally. Gait-Normal.  Neuropsychiatric Mental status exam performed with findings of-able to articulate well with normal speech/language, rate, volume and coherence, thought content normal with ability to perform basic computations and apply abstract reasoning and no evidence of hallucinations, delusions, obsessions or homicidal/suicidal ideation.  Musculoskeletal Global Assessment Spine, Ribs and Pelvis - no instability, subluxation or laxity. Right Upper Extremity - no instability, subluxation or laxity.  Lymphatic Head & Neck  General Head & Neck Lymphatics: Bilateral - Description - No Localized lymphadenopathy. Axillary  General Axillary Region: Bilateral - Description - No Localized lymphadenopathy. Femoral & Inguinal  Generalized Femoral & Inguinal Lymphatics: Left - Description - No Localized lymphadenopathy. Right - Description - No Localized lymphadenopathy.    Assessment & Plan Adin Hector MD; 07/24/2015 4:47 PM)  PROLAPSED INTERNAL HEMORRHOIDS, GRADE 4 (K64.3) Impression: Large chronically prolapsed hemorrhoid with leakage and mild incontinence. No  sphincter defect. No true rectal I think this requires removal. She doesn't want on the office. I do not think that's realistic. Good candidate for THD ligation. Her like cardiac clearance to make sure safe to come off anticoagulation. She sees Dr. Rayann Heman and his group rather regularly  Current Plans Pt Education - [facility] Hemorrhoids (): discussed with patient and provided information. Started Anusol-HC [id number], 1 (one) Application three times daily, as needed, 1 Tube, 07/24/2015, Ref. x3. Pt Education - [facility] Rectal Prep for Anorectal outpatient/office surgery: discussed with patient and provided information. Pt Education - [facility] Rectal Surgery HCI (): discussed with patient and provided information. You are being scheduled for surgery - Our schedulers will call you.  You should hear from our office's scheduling department within 5 working days about the location, date, and time of surgery. We try to make accommodations for patient's preferences in scheduling surgery, but sometimes the OR schedule or the surgeon's schedule prevents [place] from making those accommodations.  If you have not heard from our office [phone]) in 5 working days, call the office and ask for your surgeon's nurse.  If you have other questions about your diagnosis, plan, or surgery, call the office and ask for your surgeon's nurse.  The anatomy & physiology of the anorectal region was discussed. The pathophysiology of hemorrhoids and differential diagnosis was discussed. Natural history risks without surgery was discussed. I stressed the importance of a bowel regimen to have daily soft bowel movements to minimize progression of disease. Interventions such as sclerotherapy & banding were discussed.  The patient's symptoms are not adequately controlled by medicines and other non-operative treatments. I feel the risks & problems of no surgery outweigh the operative risks; therefore, I recommended surgery to treat the  hemorrhoids by ligation, pexy, and possible resection.  Risks such as bleeding, infection, urinary difficulties, need for further treatment, heart attack, death, and other risks were discussed. I noted a good likelihood this will help address the problem. Goals of post-operative recovery were discussed as well. Possibility that this will not correct all symptoms was explained. Post-operative pain, bleeding, constipation, and other problems after surgery were discussed. We will work to minimize complications. Educational handouts further explaining the pathology, treatment options, and bowel regimen were given as well. Questions were answered. The patient expresses understanding & wishes to proceed with surgery.  Pt Education - Pamphlet Given - The Hemorrhoid Book: discussed with patient and provided information. EXTERNAL HEMORRHOIDS WITH COMPLICATION (0000000) Impression: Consider removing at least  part of them if not tucked back in with [place].  PROLAPSED INTERNAL HEMORRHOIDS, GRADE 3 (K64.2) Impression: Involving RIGHT posterior and LEFT lateral piles. Hopefully can be treated with hemorrhoidal ligation and not excision.  Adin Hector, M.D., F.A.C.S. Gastrointestinal and Minimally Invasive Surgery [facility] [address] [place], [place] [address] [phone] Main / Paging

## 2015-07-27 ENCOUNTER — Encounter: Payer: Self-pay | Admitting: Internal Medicine

## 2015-07-28 ENCOUNTER — Telehealth: Payer: Self-pay | Admitting: Internal Medicine

## 2015-07-28 NOTE — Telephone Encounter (Signed)
New Message    . Device.  Pt request a call back to discuss why she has a remote check

## 2015-07-28 NOTE — Telephone Encounter (Signed)
Clarified with patient that the remote pacer check is for her monthly loop recorder reports. She understands that they are transmitted automatically. No further questions.

## 2015-08-01 ENCOUNTER — Ambulatory Visit (INDEPENDENT_AMBULATORY_CARE_PROVIDER_SITE_OTHER): Payer: PPO | Admitting: *Deleted

## 2015-08-01 DIAGNOSIS — I4891 Unspecified atrial fibrillation: Secondary | ICD-10-CM | POA: Diagnosis not present

## 2015-08-01 NOTE — Progress Notes (Signed)
Carelink Summary Report / Loop Recorder 

## 2015-08-05 LAB — CUP PACEART REMOTE DEVICE CHECK: MDC IDC SESS DTM: 20170101193516

## 2015-08-24 ENCOUNTER — Telehealth: Payer: Self-pay | Admitting: Internal Medicine

## 2015-08-24 NOTE — Telephone Encounter (Signed)
Ok to hold Eliquis x24 hours prior to procedure and restart 24 hours after or as directed by MD. Clearance faxed to [facility] fax # [phone] attn: Elmo Putt.

## 2015-08-24 NOTE — Telephone Encounter (Signed)
Nwe Message  RN from [place] re-faxing clearance, originally sent on 07/28/15.   Request for surgical clearance:  1. What type of surgery is being performed? hemorrhoidectomy   2. When is this surgery scheduled? Not yet- awaiting clearance  3. Are there any medications that need to be held prior to surgery and how long? Eliquis   4. Name of physician performing surgery? Gross  5. What is your office phone and fax number?   6.

## 2015-08-31 ENCOUNTER — Ambulatory Visit (INDEPENDENT_AMBULATORY_CARE_PROVIDER_SITE_OTHER): Payer: PPO | Admitting: *Deleted

## 2015-08-31 DIAGNOSIS — M545 Low back pain: Secondary | ICD-10-CM | POA: Diagnosis not present

## 2015-08-31 DIAGNOSIS — I4891 Unspecified atrial fibrillation: Secondary | ICD-10-CM | POA: Diagnosis not present

## 2015-08-31 DIAGNOSIS — M9903 Segmental and somatic dysfunction of lumbar region: Secondary | ICD-10-CM | POA: Diagnosis not present

## 2015-09-01 NOTE — Progress Notes (Signed)
Carelink Summary Report / Loop Recorder 

## 2015-09-06 ENCOUNTER — Other Ambulatory Visit: Payer: Self-pay | Admitting: *Deleted

## 2015-09-06 MED ORDER — METOPROLOL SUCCINATE ER 25 MG PO TB24: 12.5000 mg | ORAL_TABLET | Freq: Every day | ORAL | Status: DC

## 2015-09-08 DIAGNOSIS — R05 Cough: Secondary | ICD-10-CM | POA: Diagnosis not present

## 2015-09-08 DIAGNOSIS — R7309 Other abnormal glucose: Secondary | ICD-10-CM | POA: Diagnosis not present

## 2015-09-08 LAB — CUP PACEART REMOTE DEVICE CHECK: Date Time Interrogation Session: 20170302200601

## 2015-09-08 NOTE — Progress Notes (Signed)
Carelink summary report received. Battery status OK. Normal device function. No new symptom episodes, tachy episodes, brady, or pause episodes. No new AF episodes. Monthly summary reports and ROV/PRN 

## 2015-09-12 DIAGNOSIS — M170 Bilateral primary osteoarthritis of knee: Secondary | ICD-10-CM | POA: Diagnosis not present

## 2015-09-12 DIAGNOSIS — M25561 Pain in right knee: Secondary | ICD-10-CM | POA: Diagnosis not present

## 2015-09-12 DIAGNOSIS — M25562 Pain in left knee: Secondary | ICD-10-CM | POA: Diagnosis not present

## 2015-09-19 DIAGNOSIS — M179 Osteoarthritis of knee, unspecified: Secondary | ICD-10-CM | POA: Diagnosis not present

## 2015-09-19 DIAGNOSIS — I10 Essential (primary) hypertension: Secondary | ICD-10-CM | POA: Diagnosis not present

## 2015-09-19 DIAGNOSIS — E119 Type 2 diabetes mellitus without complications: Secondary | ICD-10-CM | POA: Diagnosis not present

## 2015-09-19 DIAGNOSIS — I4891 Unspecified atrial fibrillation: Secondary | ICD-10-CM | POA: Diagnosis not present

## 2015-09-20 ENCOUNTER — Encounter (HOSPITAL_COMMUNITY): Payer: Self-pay

## 2015-09-20 ENCOUNTER — Other Ambulatory Visit: Payer: Self-pay | Admitting: Surgery

## 2015-09-20 ENCOUNTER — Encounter (HOSPITAL_COMMUNITY)
Admission: RE | Admit: 2015-09-20 | Discharge: 2015-09-20 | Disposition: A | Discharge status: Home / Self Care | Payer: PPO | Source: Ambulatory Visit | Attending: Surgery | Admitting: Surgery

## 2015-09-20 DIAGNOSIS — I10 Essential (primary) hypertension: Secondary | ICD-10-CM | POA: Diagnosis not present

## 2015-09-20 DIAGNOSIS — I4891 Unspecified atrial fibrillation: Secondary | ICD-10-CM | POA: Diagnosis not present

## 2015-09-20 DIAGNOSIS — K643 Fourth degree hemorrhoids: Secondary | ICD-10-CM | POA: Diagnosis not present

## 2015-09-20 DIAGNOSIS — Z79899 Other long term (current) drug therapy: Secondary | ICD-10-CM | POA: Diagnosis not present

## 2015-09-20 DIAGNOSIS — Z87891 Personal history of nicotine dependence: Secondary | ICD-10-CM | POA: Diagnosis not present

## 2015-09-20 DIAGNOSIS — E119 Type 2 diabetes mellitus without complications: Secondary | ICD-10-CM | POA: Diagnosis not present

## 2015-09-20 DIAGNOSIS — E7800 Pure hypercholesterolemia, unspecified: Secondary | ICD-10-CM | POA: Diagnosis not present

## 2015-09-20 DIAGNOSIS — Z7901 Long term (current) use of anticoagulants: Secondary | ICD-10-CM | POA: Diagnosis not present

## 2015-09-20 DIAGNOSIS — Z7984 Long term (current) use of oral hypoglycemic drugs: Secondary | ICD-10-CM | POA: Diagnosis not present

## 2015-09-20 DIAGNOSIS — K644 Residual hemorrhoidal skin tags: Secondary | ICD-10-CM | POA: Diagnosis not present

## 2015-09-20 DIAGNOSIS — K649 Unspecified hemorrhoids: Secondary | ICD-10-CM | POA: Diagnosis not present

## 2015-09-20 DIAGNOSIS — M1990 Unspecified osteoarthritis, unspecified site: Secondary | ICD-10-CM | POA: Diagnosis not present

## 2015-09-20 HISTORY — DX: Unspecified hemorrhoids: K64.9

## 2015-09-20 LAB — PROTIME-INR
INR: 1.21 (ref 0.00–1.49)
PROTHROMBIN TIME: 15 s (ref 11.6–15.2)

## 2015-09-20 LAB — BASIC METABOLIC PANEL
ANION GAP: 9 (ref 5–15)
BUN: 11 mg/dL (ref 6–20)
CALCIUM: 10 mg/dL (ref 8.9–10.3)
CO2: 27 mmol/L (ref 22–32)
Chloride: 99 mmol/L — ABNORMAL LOW (ref 101–111)
Creatinine, Ser: 0.7 mg/dL (ref 0.44–1.00)
Glucose, Bld: 122 mg/dL — ABNORMAL HIGH (ref 65–99)
POTASSIUM: 3.6 mmol/L (ref 3.5–5.1)
SODIUM: 135 mmol/L (ref 135–145)

## 2015-09-20 LAB — CBC
HCT: 41.8 % (ref 36.0–46.0)
Hemoglobin: 14.4 g/dL (ref 12.0–15.0)
MCH: 29.3 pg (ref 26.0–34.0)
MCHC: 34.4 g/dL (ref 30.0–36.0)
MCV: 85.1 fL (ref 78.0–100.0)
PLATELETS: 197 10*3/uL (ref 150–400)
RBC: 4.91 MIL/uL (ref 3.87–5.11)
RDW: 12.4 % (ref 11.5–15.5)
WBC: 8 10*3/uL (ref 4.0–10.5)

## 2015-09-20 NOTE — Patient Instructions (Addendum)
YOUR PROCEDURE IS SCHEDULED ON : 09/22/15  REPORT TO [place] MAIN ENTRANCE FOLLOW SIGNS TO EAST ELEVATOR - GO TO 3rd FLOOR CHECK IN AT 3 EAST NURSES STATION (SHORT STAY) AT:  10:15 AM  CALL THIS NUMBER IF YOU HAVE PROBLEMS THE MORNING OF SURGERY [phone]  REMEMBER:ONLY 1 PER PERSON MAY GO TO SHORT STAY WITH YOU TO GET READY THE MORNING OF YOUR SURGERY  DO NOT EAT FOOD OR DRINK LIQUIDS AFTER MIDNIGHT  TAKE THESE MEDICINES THE MORNING OF SURGERY: NEXIUM / METOPROLOL / LORATADINE  STOP  IBUPROFEN / ALEVE / VITAMINS / HERBAL MEDS _2-3___ DAYS BEFORE SURGERY  YOU MAY NOT HAVE ANY METAL ON YOUR BODY INCLUDING HAIR PINS AND PIERCING'S. DO NOT WEAR JEWELRY, MAKEUP, LOTIONS, POWDERS OR PERFUMES. DO NOT WEAR NAIL POLISH. DO NOT SHAVE 48 HRS PRIOR TO SURGERY. MEN MAY SHAVE FACE AND NECK.  DO NOT [place]. [facility] IS NOT RESPONSIBLE FOR VALUABLES.  CONTACTS, DENTURES OR PARTIALS MAY NOT BE WORN TO SURGERY. LEAVE SUITCASE IN CAR. CAN BE BROUGHT TO ROOM AFTER SURGERY.  PATIENTS DISCHARGED THE DAY OF SURGERY WILL NOT BE ALLOWED TO DRIVE HOME.  PLEASE READ OVER THE FOLLOWING INSTRUCTION SHEETS _________________________________________________________________________________                                          [place] - PREPARING FOR SURGERY  Before surgery, you can play an important role.  Because skin is not sterile, your skin needs to be as free of germs as possible.  You can reduce the number of germs on your skin by washing with CHG (chlorahexidine gluconate) soap before surgery.  CHG is an antiseptic cleaner which kills germs and bonds with the skin to continue killing germs even after washing. Please DO NOT use if you have an allergy to CHG or antibacterial soaps.  If your skin becomes reddened/irritated stop using the CHG and inform your nurse when you arrive at Short Stay. Do not shave (including legs and underarms) for at least 48  hours prior to the first CHG shower.  You may shave your face. Please follow these instructions carefully:   1.  Shower with CHG Soap the night before surgery and the  morning of Surgery.   2.  If you choose to wash your hair, wash your hair first as usual with your  normal  Shampoo.   3.  After you shampoo, rinse your hair and body thoroughly to remove the  shampoo.                                         4.  Use CHG as you would any other liquid soap.  You can apply chg directly  to the skin and wash . Gently wash with scrungie or clean wascloth    5.  Apply the CHG Soap to your body ONLY FROM THE NECK DOWN.   Do not use on open                           Wound or open sores. Avoid contact with eyes, ears mouth and genitals (private parts).  Genitals (private parts) with your normal soap.              6.  Wash thoroughly, paying special attention to the area where your surgery  will be performed.   7.  Thoroughly rinse your body with warm water from the neck down.   8.  DO NOT shower/wash with your normal soap after using and rinsing off  the CHG Soap .                9.  Pat yourself dry with a clean towel.             10.  Wear clean night clothes to bed after shower             11.  Place clean sheets on your bed the night of your first shower and do not  sleep with pets.  Day of Surgery : Do not apply any lotions/deodorants the morning of surgery.  Please wear clean clothes to the hospital/surgery center.  FAILURE TO FOLLOW THESE INSTRUCTIONS MAY RESULT IN THE CANCELLATION OF YOUR SURGERY    PATIENT SIGNATURE_________________________________  ______________________________________________________________________

## 2015-09-21 DIAGNOSIS — M1711 Unilateral primary osteoarthritis, right knee: Secondary | ICD-10-CM | POA: Diagnosis not present

## 2015-09-21 DIAGNOSIS — M170 Bilateral primary osteoarthritis of knee: Secondary | ICD-10-CM | POA: Diagnosis not present

## 2015-09-21 DIAGNOSIS — M179 Osteoarthritis of knee, unspecified: Secondary | ICD-10-CM | POA: Diagnosis not present

## 2015-09-21 DIAGNOSIS — M25561 Pain in right knee: Secondary | ICD-10-CM | POA: Diagnosis not present

## 2015-09-22 ENCOUNTER — Encounter (HOSPITAL_COMMUNITY): Payer: Self-pay | Admitting: *Deleted

## 2015-09-22 ENCOUNTER — Ambulatory Visit (HOSPITAL_COMMUNITY): Payer: PPO | Admitting: Anesthesiology

## 2015-09-22 ENCOUNTER — Ambulatory Visit (HOSPITAL_COMMUNITY)
Admission: RE | Admit: 2015-09-22 | Discharge: 2015-09-22 | Disposition: A | Discharge status: Home / Self Care | Payer: PPO | Source: Ambulatory Visit | Attending: Surgery | Admitting: Surgery

## 2015-09-22 ENCOUNTER — Encounter (HOSPITAL_COMMUNITY)
Admission: RE | Disposition: A | Discharge status: Home / Self Care | Payer: Self-pay | Source: Ambulatory Visit | Attending: Surgery

## 2015-09-22 DIAGNOSIS — K644 Residual hemorrhoidal skin tags: Secondary | ICD-10-CM | POA: Diagnosis not present

## 2015-09-22 DIAGNOSIS — K643 Fourth degree hemorrhoids: Secondary | ICD-10-CM | POA: Insufficient documentation

## 2015-09-22 DIAGNOSIS — M1990 Unspecified osteoarthritis, unspecified site: Secondary | ICD-10-CM | POA: Diagnosis not present

## 2015-09-22 DIAGNOSIS — Z7984 Long term (current) use of oral hypoglycemic drugs: Secondary | ICD-10-CM | POA: Insufficient documentation

## 2015-09-22 DIAGNOSIS — E119 Type 2 diabetes mellitus without complications: Secondary | ICD-10-CM | POA: Diagnosis not present

## 2015-09-22 DIAGNOSIS — I10 Essential (primary) hypertension: Secondary | ICD-10-CM | POA: Insufficient documentation

## 2015-09-22 DIAGNOSIS — Z79899 Other long term (current) drug therapy: Secondary | ICD-10-CM | POA: Insufficient documentation

## 2015-09-22 DIAGNOSIS — K642 Third degree hemorrhoids: Secondary | ICD-10-CM | POA: Diagnosis not present

## 2015-09-22 DIAGNOSIS — K648 Other hemorrhoids: Secondary | ICD-10-CM | POA: Diagnosis not present

## 2015-09-22 DIAGNOSIS — I4891 Unspecified atrial fibrillation: Secondary | ICD-10-CM | POA: Insufficient documentation

## 2015-09-22 DIAGNOSIS — Z87891 Personal history of nicotine dependence: Secondary | ICD-10-CM | POA: Insufficient documentation

## 2015-09-22 DIAGNOSIS — Z7901 Long term (current) use of anticoagulants: Secondary | ICD-10-CM | POA: Insufficient documentation

## 2015-09-22 DIAGNOSIS — E7800 Pure hypercholesterolemia, unspecified: Secondary | ICD-10-CM | POA: Insufficient documentation

## 2015-09-22 HISTORY — PX: TRANSANAL HEMORRHOIDAL DEARTERIALIZATION: SHX6136

## 2015-09-22 LAB — GLUCOSE, CAPILLARY
GLUCOSE-CAPILLARY: 152 mg/dL — AB (ref 65–99)
Glucose-Capillary: 151 mg/dL — ABNORMAL HIGH (ref 65–99)

## 2015-09-22 SURGERY — TRANSANAL HEMORRHOIDAL DEARTERIALIZATION
Anesthesia: General

## 2015-09-22 MED ORDER — SODIUM CHLORIDE 0.9 % IJ SOLN
INTRAMUSCULAR | Status: AC
  Filled 2015-09-22: qty 20

## 2015-09-22 MED ORDER — SUGAMMADEX SODIUM 200 MG/2ML IV SOLN
INTRAVENOUS | Status: AC
  Filled 2015-09-22: qty 2

## 2015-09-22 MED ORDER — CIPROFLOXACIN IN D5W 400 MG/200ML IV SOLN
400.0000 mg | Freq: Once | INTRAVENOUS | Status: AC
  Administered 2015-09-22: 400 mg via INTRAVENOUS

## 2015-09-22 MED ORDER — SUGAMMADEX SODIUM 200 MG/2ML IV SOLN
INTRAVENOUS | Status: DC | PRN
  Administered 2015-09-22: 200 mg via INTRAVENOUS

## 2015-09-22 MED ORDER — LIDOCAINE HCL (CARDIAC) 20 MG/ML IV SOLN
INTRAVENOUS | Status: AC
  Filled 2015-09-22: qty 5

## 2015-09-22 MED ORDER — METRONIDAZOLE IN NACL 5-0.79 MG/ML-% IV SOLN
INTRAVENOUS | Status: AC
  Filled 2015-09-22: qty 100

## 2015-09-22 MED ORDER — FENTANYL CITRATE (PF) 100 MCG/2ML IJ SOLN
25.0000 ug | INTRAMUSCULAR | Status: DC | PRN
  Administered 2015-09-22: 25 ug via INTRAVENOUS

## 2015-09-22 MED ORDER — BUPIVACAINE-EPINEPHRINE (PF) 0.25% -1:200000 IJ SOLN
INTRAMUSCULAR | Status: AC
  Filled 2015-09-22: qty 30

## 2015-09-22 MED ORDER — LACTATED RINGERS IV SOLN: INTRAVENOUS | Status: DC

## 2015-09-22 MED ORDER — FENTANYL CITRATE (PF) 100 MCG/2ML IJ SOLN
INTRAMUSCULAR | Status: AC
  Filled 2015-09-22: qty 2

## 2015-09-22 MED ORDER — CIPROFLOXACIN IN D5W 400 MG/200ML IV SOLN
INTRAVENOUS | Status: AC
  Filled 2015-09-22: qty 200

## 2015-09-22 MED ORDER — MIDAZOLAM HCL 2 MG/2ML IJ SOLN: 0.5000 mg | Freq: Once | INTRAMUSCULAR | Status: DC | PRN

## 2015-09-22 MED ORDER — MEPERIDINE HCL 50 MG/ML IJ SOLN: 6.2500 mg | INTRAMUSCULAR | Status: DC | PRN

## 2015-09-22 MED ORDER — LACTATED RINGERS IV SOLN
INTRAVENOUS | Status: DC | PRN
  Administered 2015-09-22: 12:00:00 via INTRAVENOUS

## 2015-09-22 MED ORDER — DIBUCAINE 1 % RE OINT
TOPICAL_OINTMENT | RECTAL | Status: AC
  Filled 2015-09-22: qty 28

## 2015-09-22 MED ORDER — CHLORHEXIDINE GLUCONATE 4 % EX LIQD: 1.0000 "application " | Freq: Once | CUTANEOUS | Status: DC

## 2015-09-22 MED ORDER — ONDANSETRON HCL 4 MG/2ML IJ SOLN
INTRAMUSCULAR | Status: DC | PRN
  Administered 2015-09-22: 4 mg via INTRAVENOUS

## 2015-09-22 MED ORDER — BUPIVACAINE-EPINEPHRINE (PF) 0.25% -1:200000 IJ SOLN
INTRAMUSCULAR | Status: DC | PRN
  Administered 2015-09-22: 40 mL via PERINEURAL

## 2015-09-22 MED ORDER — FENTANYL CITRATE (PF) 100 MCG/2ML IJ SOLN
INTRAMUSCULAR | Status: DC | PRN
  Administered 2015-09-22 (×4): 50 ug via INTRAVENOUS

## 2015-09-22 MED ORDER — BUPIVACAINE LIPOSOME 1.3 % IJ SUSP
INTRAMUSCULAR | Status: DC | PRN
  Administered 2015-09-22: 20 mL

## 2015-09-22 MED ORDER — SUCCINYLCHOLINE CHLORIDE 20 MG/ML IJ SOLN
INTRAMUSCULAR | Status: DC | PRN
  Administered 2015-09-22: 100 mg via INTRAVENOUS

## 2015-09-22 MED ORDER — HYDROCODONE-ACETAMINOPHEN 7.5-325 MG PO TABS: 1.0000 | ORAL_TABLET | ORAL | Status: DC | PRN

## 2015-09-22 MED ORDER — KETOROLAC TROMETHAMINE 30 MG/ML IJ SOLN
INTRAMUSCULAR | Status: DC | PRN
  Administered 2015-09-22: 30 mg via INTRAVENOUS

## 2015-09-22 MED ORDER — BUPIVACAINE LIPOSOME 1.3 % IJ SUSP
20.0000 mL | INTRAMUSCULAR | Status: DC
  Filled 2015-09-22 (×2): qty 20

## 2015-09-22 MED ORDER — ROCURONIUM BROMIDE 100 MG/10ML IV SOLN
INTRAVENOUS | Status: AC
  Filled 2015-09-22: qty 1

## 2015-09-22 MED ORDER — LIDOCAINE HCL (CARDIAC) 20 MG/ML IV SOLN
INTRAVENOUS | Status: DC | PRN
  Administered 2015-09-22: 100 mg via INTRAVENOUS

## 2015-09-22 MED ORDER — KETOROLAC TROMETHAMINE 30 MG/ML IJ SOLN
INTRAMUSCULAR | Status: AC
  Filled 2015-09-22: qty 1

## 2015-09-22 MED ORDER — HYDROCODONE-ACETAMINOPHEN 7.5-325 MG PO TABS
1.0000 | ORAL_TABLET | Freq: Once | ORAL | Status: AC
  Administered 2015-09-22: 1 via ORAL
  Filled 2015-09-22: qty 1

## 2015-09-22 MED ORDER — PROPOFOL 10 MG/ML IV BOLUS
INTRAVENOUS | Status: AC
  Filled 2015-09-22: qty 20

## 2015-09-22 MED ORDER — PROPOFOL 10 MG/ML IV BOLUS
INTRAVENOUS | Status: DC | PRN
  Administered 2015-09-22: 160 mg via INTRAVENOUS

## 2015-09-22 MED ORDER — ROCURONIUM BROMIDE 100 MG/10ML IV SOLN
INTRAVENOUS | Status: DC | PRN
  Administered 2015-09-22: 30 mg via INTRAVENOUS

## 2015-09-22 MED ORDER — PROMETHAZINE HCL 25 MG/ML IJ SOLN: 6.2500 mg | INTRAMUSCULAR | Status: DC | PRN

## 2015-09-22 MED ORDER — DEXAMETHASONE SODIUM PHOSPHATE 10 MG/ML IJ SOLN: INTRAMUSCULAR | Status: DC | PRN

## 2015-09-22 MED ORDER — METRONIDAZOLE IN NACL 5-0.79 MG/ML-% IV SOLN
500.0000 mg | Freq: Once | INTRAVENOUS | Status: AC
  Administered 2015-09-22: 500 mg via INTRAVENOUS

## 2015-09-22 SURGICAL SUPPLY — 27 items
BLADE SURG 15 STRL LF DISP TIS (BLADE) ×1 IMPLANT
BLADE SURG 15 STRL SS (BLADE) ×2
BRIEF STRETCH FOR OB PAD LRG (UNDERPADS AND DIAPERS) ×3 IMPLANT
COVER SURGICAL LIGHT HANDLE (MISCELLANEOUS) ×3 IMPLANT
DECANTER SPIKE VIAL GLASS SM (MISCELLANEOUS) ×3 IMPLANT
DRAPE LAPAROTOMY T 102X78X121 (DRAPES) ×3 IMPLANT
DRSG PAD ABDOMINAL 8X10 ST (GAUZE/BANDAGES/DRESSINGS) ×6 IMPLANT
ELECT PENCIL ROCKER SW 15FT (MISCELLANEOUS) ×3 IMPLANT
ELECT REM PT RETURN 9FT ADLT (ELECTROSURGICAL) ×3
ELECTRODE REM PT RTRN 9FT ADLT (ELECTROSURGICAL) ×1 IMPLANT
GAUZE SPONGE 4X4 12PLY STRL (GAUZE/BANDAGES/DRESSINGS) ×6 IMPLANT
GAUZE SPONGE 4X4 16PLY XRAY LF (GAUZE/BANDAGES/DRESSINGS) ×3 IMPLANT
GLOVE BIOGEL PI IND STRL 8 (GLOVE) ×1 IMPLANT
GLOVE BIOGEL PI INDICATOR 8 (GLOVE) ×2
GLOVE ECLIPSE 8.0 STRL XLNG CF (GLOVE) ×3 IMPLANT
GOWN STRL REUS W/TWL XL LVL3 (GOWN DISPOSABLE) ×6 IMPLANT
KIT BASIN OR (CUSTOM PROCEDURE TRAY) ×3 IMPLANT
KIT SLIDE ONE PROLAPS HEMORR (KITS) ×3 IMPLANT
LUBRICANT JELLY K Y 4OZ (MISCELLANEOUS) ×3 IMPLANT
NEEDLE HYPO 22GX1.5 SAFETY (NEEDLE) ×3 IMPLANT
PACK BASIC VI WITH GOWN DISP (CUSTOM PROCEDURE TRAY) ×3 IMPLANT
SUT CHROMIC 3 0 SH 27 (SUTURE) ×6 IMPLANT
SUT VIC AB 2-0 UR6 27 (SUTURE) ×3 IMPLANT
SYR 20CC LL (SYRINGE) ×3 IMPLANT
TOWEL OR 17X26 10 PK STRL BLUE (TOWEL DISPOSABLE) ×3 IMPLANT
TOWEL OR NON WOVEN STRL DISP B (DISPOSABLE) ×3 IMPLANT
YANKAUER SUCT BULB TIP 10FT TU (MISCELLANEOUS) ×3 IMPLANT

## 2015-09-22 NOTE — Transfer of Care (Signed)
Immediate Anesthesia Transfer of Care Note  Patient: Erika Lee  Procedure(s) Performed: Procedure(s): TRANSANAL HEMORRHOIDAL LIGATION/PEXY EUA POSSIBLE HEMORRHOIDECTOMY  (N/A)  Patient Location: PACU  Anesthesia Type:General  Level of Consciousness: sedated  Airway & Oxygen Therapy: Patient Spontanous Breathing and Patient connected to face mask oxygen  Post-op Assessment: Report given to RN and Post -op Vital signs reviewed and stable  Post vital signs: Reviewed and stable  Last Vitals:  Filed Vitals:   09/22/15 1002  Pulse: 58  Temp: 36.4 C  Resp: 18    Complications: No apparent anesthesia complications

## 2015-09-22 NOTE — Anesthesia Procedure Notes (Signed)
Procedure Name: Intubation Date/Time: 09/22/2015 12:33 PM Performed by: Lind Covert Pre-anesthesia Checklist: Patient identified, Emergency Drugs available, Suction available, Patient being monitored and Timeout performed Patient Re-evaluated:Patient Re-evaluated prior to inductionOxygen Delivery Method: Circle system utilized Preoxygenation: Pre-oxygenation with 100% oxygen Intubation Type: IV induction Laryngoscope Size: Mac and 4 Grade View: Grade I Tube type: Oral Tube size: 7.0 mm Number of attempts: 1 Airway Equipment and Method: Stylet Placement Confirmation: ETT inserted through vocal cords under direct vision,  positive ETCO2 and breath sounds checked- equal and bilateral Secured at: 21 cm Tube secured with: Tape Dental Injury: Teeth and Oropharynx as per pre-operative assessment

## 2015-09-22 NOTE — Discharge Instructions (Signed)
ANORECTAL SURGERY:  POST OPERATIVE INSTRUCTIONS  1. Take your usually prescribed home medications unless otherwise directed. 2. DIET: Follow a light bland diet the first 24 hours after arrival home, such as soup, liquids, crackers, etc.  Be sure to include lots of fluids daily.  Avoid fast food or heavy meals as your are more likely to get nauseated.  Eat a low fat the next few days after surgery.   3. PAIN CONTROL: a. Pain is best controlled by a usual combination of three different methods TOGETHER: i. Ice/Heat ii. Over the counter pain medication iii. Prescription pain medication b. Most patients will experience some swelling and discomfort in the anus/rectal area. and incisions.  Ice packs or heat (30-60 minutes up to 6 times a day) will help. Use ice for the first few days to help decrease swelling and bruising, then switch to heat such as warm towels, sitz baths, warm baths, etc to help relax tight/sore spots and speed recovery.  Some people prefer to use ice alone, heat alone, alternating between ice & heat.  Experiment to what works for you.  Swelling and bruising can take several weeks to resolve.   c. It is helpful to take an over-the-counter pain medication regularly for the first few weeks.  Choose one of the following that works best for you: i. Naproxen (Aleve, etc)  Two 232m tabs twice a day ii. Ibuprofen (Advil, etc) Three 2017mtabs four times a day (every meal & bedtime) iii. Acetaminophen (Tylenol, etc) 500-65058mour times a day (every meal & bedtime) d. A  prescription for pain medication (such as oxycodone, hydrocodone, etc) should be given to you upon discharge.  Take your pain medication as prescribed.  i. If you are having problems/concerns with the prescription medicine (does not control pain, nausea, vomiting, rash, itching, etc), please call us [phone] see if we need to switch you to a different pain medicine that will work better for you and/or control your  side effect better. ii. If you need a refill on your pain medication, please contact your pharmacy.  They will contact our office to request authorization. Prescriptions will not be filled after 5 pm or on week-ends.  Use a Sitz Bath 4-8 times a day for relief   [employer] bath is a warm water bath taken in the sitting position that covers only the hips and buttocks. It may be used for either healing or hygiene purposes. Sitz baths are also used to relieve pain, itching, or muscle spasms. The water may contain medicine. Moist heat will help you heal and relax.  HOME CARE INSTRUCTIONS  Take 3 to 4 sitz baths a day. 1. Fill the bathtub half full with warm water. 2. Sit in the water and open the drain a little. 3. Turn on the warm water to keep the tub half full. Keep the water running constantly. 4. Soak in the water for 15 to 20 minutes. 5. After the sitz bath, pat the affected area dry first.   4. KEEP YOUR BOWELS REGULAR a. The goal is one bowel movement a day b. Avoid getting constipated.  Between the surgery and the pain medications, it is common to experience some constipation.  Increasing fluid intake and taking a fiber supplement (such as Metamucil, Citrucel, FiberCon, MiraLax, etc) 1-2 times a day regularly will usually help prevent this problem from occurring.  A mild laxative (prune juice, Milk of Magnesia, MiraLax, etc) should be taken according to package  directions if there are no bowel movements after 48 hours. c. Watch out for diarrhea.  If you have many loose bowel movements, simplify your diet to bland foods & liquids for a few days.  Stop any stool softeners and decrease your fiber supplement.  Switching to mild anti-diarrheal medications (Kayopectate, Pepto Bismol) can help.  If this worsens or does not improve, please call us.  5. Wound Care  a. Remove your bandages the day after surgery.  Unless discharge instructions indicate otherwise, leave your bandage dry and in  place overnight.  Remove the bandage during your first bowel movement.   b. Wear an absorbent pad or soft cotton gauze in your underwear as needed to catch any drainage and help keep the area  c. Keep the area clean and dry.  Bathe / shower every day.  Keep the area clean by showering / bathing over the incision / wound.   It is okay to soak an open wound to help wash it.  Wet wipes or showers / gentle washing after bowel movements is often less traumatic than regular toilet paper. d. Dennis Bast will often notice bleeding with bowel movements.  This should slow down by the end of the first week of surgery e. Expect some drainage.  This should slow down, too, by the end of the first week of surgery.  Wear an absorbent pad or soft cotton gauze in your underwear until the drainage stops.  6. ACTIVITIES as tolerated:   a. You may resume regular (light) daily activities beginning the next day--such as daily self-care, walking, climbing stairs--gradually increasing activities as tolerated.  If you can walk 30 minutes without difficulty, it is safe to try more intense activity such as jogging, treadmill, bicycling, low-impact aerobics, swimming, etc. b. Save the most intensive and strenuous activity for last such as sit-ups, heavy lifting, contact sports, etc  Refrain from any heavy lifting or straining until you are off narcotics for pain control.   c. DO NOT PUSH THROUGH PAIN.  Let pain be your guide: If it hurts to do something, don't do it.  Pain is your body warning you to avoid that activity for another week until the pain goes down. d. You may drive when you are no longer taking prescription pain medication, you can comfortably sit for long periods of time, and you can safely maneuver your car and apply brakes. e. Dennis Bast may have sexual intercourse when it is comfortable.  7. FOLLOW UP in our office a. Please call [facility] at [phone] to set up an appointment to see your surgeon in the office for a follow-up  appointment approximately 2 weeks after your surgery. b. Make sure that you call for this appointment the day you arrive home to insure a convenient appointment time. 10. IF YOU HAVE DISABILITY OR FAMILY LEAVE FORMS, BRING THEM TO THE OFFICE FOR PROCESSING.  DO NOT GIVE THEM TO YOUR DOCTOR.        WHEN TO CALL us [phone]: 1. Poor pain control 2. Reactions / problems with new medications (rash/itching, nausea, etc)  3. Fever over 101.5 F (38.5 C) 4. Inability to urinate 5. Nausea and/or vomiting 6. Worsening swelling or bruising 7. Continued bleeding from incision. 8. Increased pain, redness, or drainage from the incision  The clinic staff is available to answer your questions during regular business hours (8:30am-5pm).  Please dont hesitate to call and ask to speak to one of our nurses for clinical concerns.   A  surgeon from [facility] is always on call at the hospitals   If you have a medical emergency, go to the nearest emergency room or call 911.    [facility], [place], [place], [place], [place]  [address] ? MAIN: [phone] ? TOLL FREE: [phone] ? FAX [phone] [email or web address]  HEMORRHOIDS  The rectum is the last foot of your colon, and it naturally stretches to hold stool.  Hemorrhoidal piles are natural clusters of blood vessels that help the rectum and anal canal stretch to hold stool and allow bowel movements to eliminate feces.   Hemorrhoids are abnormally swollen blood vessels in the rectum.  Too much pressure in the rectum causes hemorrhoids by forcing blood to stretch and bulge the walls of the veins, sometimes even rupturing them.  Hemorrhoids can become like varicose veins you might see on a person's legs.  Most people will develop a flare of hemorrhoids in their lifetime.  When bulging hemorrhoidal veins are irritated, they can swell, burn, itch, cause pain, and bleed.  Most flares will  calm down gradually own within a few weeks.  However, once hemorrhoids are created, they are difficult to get rid of completely and tend to flare more easily than the first flare.   Fortunately, good habits and simple medical treatment usually control hemorrhoids well, and surgery is needed only in severe cases. Types of Hemorrhoids:  Internal hemorrhoids usually don't initially hurt or itch; they are deep inside the rectum and usually have no sensation. If they begin to push out (prolapse), pain and burning can occur.  However, internal hemorrhoids can bleed.  Anal bleeding should not be ignored since bleeding could come from a dangerous source like colorectal cancer, so persistent rectal bleeding should be investigated by a doctor, sometimes with a colonoscopy.  External hemorrhoids cause most of the symptoms - pain, burning, and itching. Nonirritated hemorrhoids can look like small skin tags coming out of the anus.   Thrombosed hemorrhoids can form when a hemorrhoid blood vessel bursts and causes the hemorrhoid to suddenly swell.  A purple blood clot can form in it and become an excruciatingly painful lump at the anus. Because of these unpleasant symptoms, immediate incision and drainage by a surgeon at an office visit can provide much relief of the pain.    PREVENTION Avoiding the most frequent causes listed below will prevent most cases of hemorrhoids: Constipation Hard stools Diarrhea  Constant sitting  Straining with bowel movements Sitting on the toilet for a long time  Severe coughing  episodes Pregnancy / Childbirth  Heavy Lifting  Sometimes avoiding the above triggers is difficult:  How can you avoid sitting all day if you have a seated job? Also, we try to avoid coughing and diarrhea, but sometimes its beyond your control.  Still, there are some practical hints to help: Keep the anal and genital area clean.  Moistened tissues such as flushable wet wipes are less irritating than toilet  paper.  Using irrigating showers or bottle irrigation washing gently cleans this sensitive area.   Avoid dry toilet paper when cleaning after bowel movements.  Marland Kitchen Keep the anal and genital area dry.  Lightly pat the rectal area dry.  Avoid rubbing.  Talcum or baby powders can help GET YOUR STOOLS SOFT.   This is the most important way to prevent irritated hemorrhoids.  Hard stools are like sandpaper to the anorectal canal and will cause more problems.  The goal: ONE  SOFT BOWEL MOVEMENT A DAY!  BMs from every other day to 3 times a day is a tolerable range Treat coughing, diarrhea and constipation early since irritated hemorrhoids may soon follow.  If your main job activity is seated, always stand or walk during your breaks. Make it a point to stand and walk at least 5 minutes every hour and try to shift frequently in your chair to avoid direct rectal pressure.  Always exhale as you strain or lift. Don't hold your breath.  Do not delay or try to prevent a bowel movement when the urge is present. Exercise regularly (walking or jogging 60 minutes a day) to stimulate the bowels to move. No reading or other activity while on the toilet. If bowel movements take longer than 5 minutes, you are too constipated. AVOID CONSTIPATION Drink plenty of liquids (1 1/2 to 2 quarts of water and other fluids a day unless fluid restricted for another medical condition). Liquids that contain caffeine (coffee a, tea, soft drinks) can be dehydrating and should be avoided until constipation is controlled. Consider minimizing milk, as dairy products may be constipating. Eat plenty of fiber (30g a day ideal, more if needed).  Fiber is the undigested part of plant food that passes into the colon, acting as natures broom to encourage bowel motility and movement.  Fiber can absorb and hold large amounts of water. This results in a larger, bulkier stool, which is soft and easier to pass.  Eating foods high in fiber - 12 servings -  such as  Vegetables: Root (potatoes, carrots, turnips), Leafy green (lettuce, salad greens, celery, spinach), High residue (cabbage, broccoli, etc.) Fruit: Fresh, Dried (prunes, apricots, cherries), Stewed (applesauce)  Whole grain breads, pasta, whole wheat Bran cereals, muffins, etc. Consider adding supplemental bulking fiber which retains large volumes of water: Psyllium ground seeds (native plant from [place])--available as Metamucil, Konsyl, Effersyllium, Per Diem Fiber, or the less expensive generic forms.  Citrucel  (methylcellulose wood fiber) . FiberCon (Polycarbophil) Polyethylene Glycol - and artificial fiber commonly called Miralax or Glycolax.  It is helpful for people with gassy or bloated feelings with regular fiber Flax Seed - a less gassy natural fiber  Laxatives can be useful for a short period if constipation is severe Osmotics (Milk of Magnesia, Fleets Phospho-Soda, Magnesium Citrate)  Stimulants (Senokot,   Castor Oil,  Dulcolax, Ex-Lax)    Laxatives are not a good long-term solution as it can stress the bowels and cause too much mineral loss and dehydration.   Avoid taking laxatives for more than 7 days in a row.  AVOID DIARRHEA Switch to liquids and simpler foods for a few days to avoid stressing your intestines further. Avoid dairy products (especially milk & ice cream) for a short time.  The intestines often can lose the ability to digest lactose when stressed. Avoid foods that cause gassiness or bloating.  Typical foods include beans and other legumes, cabbage, broccoli, and dairy foods.  Every person has some sensitivity to other foods, so listen to your body and avoid those foods that trigger problems for you. Adding fiber (Citrucel, Metamucil, FiberCon, Flax seed, Miralax) gradually can help thicken stools by absorbing excess fluid and retrain the intestines to act more normally.  Slowly increase the dose over a few weeks.  Too much fiber too soon can backfire  and cause cramping & bloating. Probiotics (such as active yogurt, Align, etc) may help repopulate the intestines and colon with normal bacteria and calm down a sensitive  digestive tract.  Most studies show it to be of mild help, though, and such products can be costly. Medicines: Bismuth subsalicylate (ex. Kayopectate, Pepto Bismol) every 30 minutes for up to 6 doses can help control diarrhea.  Avoid if pregnant. Loperamide (Immodium) can slow down diarrhea.  Start with two tablets (4mg  total) first and then try one tablet every 6 hours.  Avoid if you are having fevers or severe pain.  If you are not better or start feeling worse, stop all medicines and call your doctor for advice Call your doctor if you are getting worse or not better.  Sometimes further testing (cultures, endoscopy, X-ray studies, bloodwork, etc) may be needed to help diagnose and treat the cause of the diarrhea.  TROUBLESHOOTING IRREGULAR BOWELS 1) Avoid extremes of bowel movements (no bad constipation/diarrhea) 2) Miralax 17gm mixed in 8oz. water or juice-daily. May use BID as needed.  3) Gas-x,Phazyme, etc. as needed for gas & bloating.  4) Soft,bland diet. No spicy,greasy,fried foods.  5) Prilosec over-the-counter as needed  6) May hold gluten/wheat products from diet to see if symptoms improve.  7)  May try probiotics (Align, Activa, etc) to help calm the bowels down 7) If symptoms become worse call back immediately.   TREATMENT OF HEMORRHOID FLARE If these preventive measures fail, you must take action right away! Hemorrhoids are one condition that can be mild in the morning and become intolerable by nightfall. Most hemorrhoidal flares take several weeks to calm down.  These suggestions can help: Warm soaks.  This helps more than any topical medication.  Use up to 8 times a day.  Usually sitz baths or sitting in a warm bathtub helps.  Sitting on moist warm towels are helpful.  Switching to ice packs/cool compresses can  be helpful  Use a Sitz Bath 4-8 times a day for relief A sitz bath is a warm water bath taken in the sitting position that covers only the hips and buttocks. It may be used for either healing or hygiene purposes. Sitz baths are also used to relieve pain, itching, or muscle spasms. The water may contain medicine. Moist heat will help you heal and relax.  HOME CARE INSTRUCTIONS  Take 3 to 4 sitz baths a day. 6. Fill the bathtub half full with warm water. 7. Sit in the water and open the drain a little. 8. Turn on the warm water to keep the tub half full. Keep the water running constantly. 9. Soak in the water for 15 to 20 minutes. 10. After the sitz bath, pat the affected area dry first. SEEK MEDICAL CARE IF:  You get worse instead of better. Stop the sitz baths if you get worse.  Normalize your bowels.  Extremes of diarrhea or constipation will make hemorrhoids worse.  One soft bowel movement a day is the goal.  Fiber can help get your bowels regular Wet wipes instead of toilet paper Pain control with a NSAID such as ibuprofen (Advil) or naproxen (Aleve) or acetaminophen (Tylenol) around the clock.  Narcotics are constipating and should be minimized if possible Topical creams contain steroids (bydrocortisone) or local anesthetic (xylocaine) can help make pain and itching more tolerable.   EVALUATION If hemorrhoids are still causing problems, you could benefit by an evaluation by a surgeon.  The surgeon will obtain a history and examine you.  If hemorrhoids are diagnosed, some therapies can be offered in the office, usually with an anoscope into the less sensitive area of the rectum: -injection  of hemorrhoids (sclerotherapy) can scar the blood vessels of the swollen/enlarged hemorrhoids to help shrink them down to a more normal size -rubber banding of the enlarged hemorrhoids to help shrink them down to a more normal size -drainage of the blood clot causing a thrombosed hemorrhoid,  to relieve  the severe pain   While 90% of the time such problems from hemorrhoids can be managed without preceding to surgery, sometimes the hemorrhoids require a operation to control the problem (uncontrolled bleeding, prolapse, pain, etc.).   This involves being placed under general anesthesia where the surgeon can confirm the diagnosis and remove, suture, or staple the hemorrhoid(s).  Your surgeon can help you treat the problem appropriately.    Managing Pain  Pain after surgery or related to activity is often due to strain/injury to muscle, tendon, nerves and/or incisions.  This pain is usually short-term and will improve in a few months.   Many people find it helpful to do the following things TOGETHER to help speed the process of healing and to get back to regular activity more quickly:  1. Avoid heavy physical activity at first a. No lifting greater than 20 pounds at first, then increase to lifting as tolerated over the next few weeks b. Do not push through the pain.  Listen to your body and avoid positions and maneuvers than reproduce the pain.  Wait a few days before trying something more intense c. Walking is okay as tolerated, but go slowly and stop when getting sore.  If you can walk 30 minutes without stopping or pain, you can try more intense activity (running, jogging, aerobics, cycling, swimming, treadmill, sex, sports, weightlifting, etc ) d. Remember: If it hurts to do it, then dont do it!  2. Take Anti-inflammatory medication a. Choose ONE of the following over-the-counter medications: i.            Acetaminophen 500mg  tabs (Tylenol) 1-2 pills with every meal and just before bedtime (avoid if you have liver problems) ii.            Naproxen 220mg  tabs (ex. Aleve) 1-2 pills twice a day (avoid if you have kidney, stomach, IBD, or bleeding problems) iii. Ibuprofen 200mg  tabs (ex. Advil, Motrin) 3-4 pills with every meal and just before bedtime (avoid if you have kidney, stomach, IBD, or  bleeding problems) b. Take with food/snack around the clock for 1-2 weeks i. This helps the muscle and nerve tissues become less irritable and calm down faster  3. Use a Heating pad or Ice/Cold Pack a. 4-6 times a day b. May use warm bath/hottub  or showers  4. Try Gentle Massage and/or Stretching  a. at the area of pain many times a day b. stop if you feel pain - do not overdo it  Try these steps together to help you body heal faster and avoid making things get worse.  Doing just one of these things may not be enough.    If you are not getting better after two weeks or are noticing you are getting worse, contact our office for further advice; we may need to re-evaluate you & see what other things we can do to help.  Apixaban oral tablets What is this medicine? APIXABAN (a PIX a ban) is an anticoagulant (blood thinner). It is used to lower the chance of stroke in people with a medical condition called atrial fibrillation. It is also used to treat or prevent blood clots in the lungs or in the veins.  This medicine may be used for other purposes; ask your health care provider or pharmacist if you have questions. What should I tell my health care provider before I take this medicine? They need to know if you have any of these conditions: -bleeding disorders -bleeding in the brain -blood in your stools (black or tarry stools) or if you have blood in your vomit -history of stomach bleeding -kidney disease -liver disease -mechanical heart valve -an unusual or allergic reaction to apixaban, other medicines, foods, dyes, or preservatives -pregnant or trying to get pregnant -breast-feeding How should I use this medicine? Take this medicine by mouth with a glass of water. Follow the directions on the prescription label. You can take it with or without food. If it upsets your stomach, take it with food. Take your medicine at regular intervals. Do not take it more often than directed. Do not stop  taking except on your doctor's advice. Stopping this medicine may increase your risk of a blot clot. Be sure to refill your prescription before you run out of medicine. Talk to your pediatrician regarding the use of this medicine in children. Special care may be needed. Overdosage: If you think you have taken too much of this medicine contact a poison control center or emergency room at once. NOTE: This medicine is only for you. Do not share this medicine with others. What if I miss a dose? If you miss a dose, take it as soon as you can. If it is almost time for your next dose, take only that dose. Do not take double or extra doses. What may interact with this medicine? This medicine may interact with the following: -aspirin and aspirin-like medicines -certain medicines for fungal infections like ketoconazole and itraconazole -certain medicines for seizures like carbamazepine and phenytoin -certain medicines that treat or prevent blood clots like warfarin, enoxaparin, and dalteparin -clarithromycin -NSAIDs, medicines for pain and inflammation, like ibuprofen or naproxen -rifampin -ritonavir -St. John's wort This list may not describe all possible interactions. Give your health care provider a list of all the medicines, herbs, non-prescription drugs, or dietary supplements you use. Also tell them if you smoke, drink alcohol, or use illegal drugs. Some items may interact with your medicine. What should I watch for while using this medicine? Notify your doctor or health care professional and seek emergency treatment if you develop breathing problems; changes in vision; chest pain; severe, sudden headache; pain, swelling, warmth in the leg; trouble speaking; sudden numbness or weakness of the face, arm, or leg. These can be signs that your condition has gotten worse. If you are going to have surgery, tell your doctor or health care professional that you are taking this medicine. Tell your health  care professional that you use this medicine before you have a spinal or epidural procedure. Sometimes people who take this medicine have bleeding problems around the spine when they have a spinal or epidural procedure. This bleeding is very rare. If you have a spinal or epidural procedure while on this medicine, call your health care professional immediately if you have back pain, numbness or tingling (especially in your legs and feet), muscle weakness, paralysis, or loss of bladder or bowel control. Avoid sports and activities that might cause injury while you are using this medicine. Severe falls or injuries can cause unseen bleeding. Be careful when using sharp tools or knives. Consider using an Copy. Take special care brushing or flossing your teeth. Report any injuries, bruising, or red spots  on the skin to your doctor or health care professional. What side effects may I notice from receiving this medicine? Side effects that you should report to your doctor or health care professional as soon as possible: -allergic reactions like skin rash, itching or hives, swelling of the face, lips, or tongue -signs and symptoms of bleeding such as bloody or black, tarry stools; red or dark-brown urine; spitting up blood or brown material that looks like coffee grounds; red spots on the skin; unusual bruising or bleeding from the eye, gums, or nose This list may not describe all possible side effects. Call your doctor for medical advice about side effects. You may report side effects to FDA at 1-800-FDA-1088. Where should I keep my medicine? Keep out of the reach of children. Store at room temperature between 20 and 25 degrees C (68 and 77 degrees F). Throw away any unused medicine after the expiration date. NOTE: This sheet is a summary. It may not cover all possible information. If you have questions about this medicine, talk to your doctor, pharmacist, or health care provider.    2016,  Elsevier/Gold Standard. (2013-02-19 11:59:24)

## 2015-09-22 NOTE — Interval H&P Note (Signed)
History and Physical Interval Note:  09/22/2015 12:23 PM  Erika Lee  has presented today for surgery, with the diagnosis of Grade 3/4 intermal hemorrhoids with discomfort and prolapse. External hemorrhoids with bleeding    The various methods of treatment have been discussed with the patient and family. After consideration of risks, benefits and other options for treatment, the patient has consented to  Procedure(s): TRANSANAL HEMORRHOIDAL LIGATION/PEXY EUA POSSIBLE HEMORRHOIDECTOMY  (N/A) as a surgical intervention .  The patient's history has been reviewed, patient examined, no change in status, stable for surgery.  I have reviewed the patient's chart and labs.  Questions were answered to the patient's satisfaction.     , C.

## 2015-09-22 NOTE — Op Note (Signed)
09/22/2015  2:23 PM  PATIENT:  Erika Lee  70 y.o. 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   PROCEDURE:    TRANSANAL HEMORRHOIDAL LIGATION/PEXY using THD system EXTERNAL HEMORRHOIDECTOMY X 2   SURGEON:  Surgeon(s): Michael Boston, MD  ASSISTANT: none   ANESTHESIA:   Local field block Anorectal block General  0.25% bupivacaine with epinephrine & Liposomal bupivacaine (Experel) at the beginning & end of the case.  EBL:  Total I/O In: 1300 [I.V.:1300] Out: 20 [Blood:20]  Delay start of Pharmacological VTE agent (>24hrs) due to surgical blood loss or risk of bleeding:  no  DRAINS: none   SPECIMEN:  Source of Specimen:  INT/EXT HEMORRHOIDS  DISPOSITION OF SPECIMEN:  PATHOLOGY  COUNTS:  YES  PLAN OF CARE: Discharge to home after PACU  PATIENT DISPOSITION:  PACU - hemodynamically stable.  INDICATION: Pleasant elderly woman struggled hemorrhoids for decades.  Symptoms of chronic drainage pain and bleeding.  Resisted surgical evaluation but relented.  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  I recommended examination under anesthesia with hemorrhoidal ligation/pexy/removal.  The anatomy & physiology of the anorectal region was discussed.  The pathophysiology of hemorrhoids and differential diagnosis was discussed.  Natural history risks without surgery was discussed.   I stressed the importance of a bowel regimen to have daily soft bowel movements to minimize progression of disease.  Interventions such as  sclerotherapy & banding were discussed.  The patient's symptoms are not adequately controlled by medicines and other non-operative treatments.  I feel the risks & problems of no surgery outweigh the operative risks; therefore, I recommended surgery to treat the hemorrhoids by ligation, pexy, and possible resection.  Risks such as bleeding, infection, urinary difficulties, need for further treatment, heart attack, death, and other risks were discussed.   I noted a good likelihood this will help address the problem.  Goals of post-operative recovery were discussed as well.  Possibility that this will not correct all symptoms was explained.  Post-operative pain, bleeding, constipation, and other problems after surgery were discussed.  We will work to minimize complications.   Educational handouts further explaining the pathology, treatment options, and bowel regimen were given as well.  Questions were answered.  The patient expresses understanding & wishes to proceed with surgery.   OR FINDINGS: Patient had internal/external hemorrhoids all 3 piles.  Large right anterior internal hemorrhoid with partial/complete prolapse.  Large right posterior internal hemorrhoid that easily prolapse.  A large left lateral hemorrhoid not prolapsed.  External hemorrhoids as well.  Somewhat redundant rectum but no true proximal to that she has/prolapse.  No major rectocele.  DESCRIPTION:   Informed consent was confirmed. Patient underwent general anesthesia without difficulty. Patient was placed into prone positioning.  The perianal region was prepped and draped in sterile fashion. Surgical time-out confirmed our plan.  I did digital rectal examination and then transitioned over to anoscopy to get a sense of the anatomy.  I switched over to the [facility] fiberoptically lit Doppler anocope.   Using the Doppler on the tip of the [place] anoscope, I identified the arterial hemorrhoidal vessels coming in in the classic  hexagonal  anatomical pattern (right posterior/lateral/anterior, left posterior /lateral/anterior).    I proceeded to ligate the hemorrhoidal arteries. I used a 2-0 Vicryl suture on a UR-6 needle in a figure-of-eight fashion over the signal around 6 cm proximal to the anal verge. I then ran that stitch longitudinally more distally to the white line of [place]. I then tied that stitch down to cause a hemorrhoidopexy. I did that for all 6 locations.    Of note, the right posterior and anterior hemorrhoidal piles were so massive that had to partially excise them down to a more normal size and then ran those down to close in.  Had to do a right posterior external and right anterior external wounds as well.  The left lateral internal hemorrhoid was the smallest although still moderate size.  I decided to ligate and pexy that pile, not removing it in order to avoid too much excision in 1 case.  I redid Doppler anoscopy. I Identified a signal at the left and right anterior locations.  I isolated and ligated this with a figure-of-eight stitch. Signals went away.  At completion of this, all hemorrhoids were reduced into the rectum.  There is no more prolapse.   I closed the right posterior external hemorrhoidectomy wound with 3-0 absorbable chromic suture in a running fashion.  I left the right anterior external hemorrhoid wound open otherwise devoid stricturing.  At the end of this, external anatomy looked much improved.  Sphincter tone intact.  I repeated anoscopy and examination.  Hemostasis was good.  Patient is extubated & in the recovery room.  I had discussed postop care in detail with the patient in the preop holding area.  She had many questions and interruptions but eventually was able to get my instructions and address her concerns to her satisfaction.  Instructions are written.  I discussed or findings with the patient's husband as well.  Explained instructions.  He had questions about his own health.  I answered  them to test viability and gently encouraged him to discuss with his primary care physician and cardiologist as well.  Breast understanding and appreciation.   Adin Hector, M.D., F.A.C.S. Gastrointestinal and Minimally Invasive Surgery [facility] [address] [place], [place] [address] [phone] Main / Paging

## 2015-09-22 NOTE — Anesthesia Preprocedure Evaluation (Addendum)
Anesthesia Evaluation  Patient identified by MRN, date of birth, ID band Patient awake    Reviewed: Allergy & Precautions, NPO status , Patient's Chart, lab work & pertinent test results  History of Anesthesia Complications Negative for: history of anesthetic complications  Airway Mallampati: II  TM Distance: >3 FB Neck ROM: Full    Dental  (+) Edentulous Upper, Edentulous Lower   Pulmonary former smoker (quit 1995),    breath sounds clear to auscultation       Cardiovascular hypertension, Pt. on medications and Pt. on home beta blockers (-) angina+ dysrhythmias (s/p ablation) Atrial Fibrillation  Rhythm:Regular Rate:Normal  '15 ECHO: EF 55-60%, valves OK '14 Stress ECHO: no ischemia   Neuro/Psych negative neurological ROS     GI/Hepatic Neg liver ROS, GERD  Medicated and Controlled,  Endo/Other  diabetes (glu 151), Oral Hypoglycemic Agents  Renal/GU negative Renal ROS     Musculoskeletal   Abdominal   Peds  Hematology  (+) Blood dyscrasia (eliquis), ,   Anesthesia Other Findings   Reproductive/Obstetrics                           Anesthesia Physical Anesthesia Plan  ASA: III  Anesthesia Plan: General   Post-op Pain Management:    Induction: Intravenous  Airway Management Planned: LMA  Additional Equipment:   Intra-op Plan:   Post-operative Plan:   Informed Consent: I have reviewed the patients History and Physical, chart, labs and discussed the procedure including the risks, benefits and alternatives for the proposed anesthesia with the patient or authorized representative who has indicated his/her understanding and acceptance.   Dental advisory given  Plan Discussed with: CRNA and Surgeon  Anesthesia Plan Comments: (Plan routine monitors, GA- LMA OK)        Anesthesia Quick Evaluation

## 2015-09-22 NOTE — H&P (Addendum)
Erika Lee. Erika Lee 07/24/2015 3:27 PM Location: [facility] Patient #: [id number] DOB: [birth year] Married / Language: English / Race: White Female   History of Present Illness  Patient words: New.  The patient is a 70 year old female who presents with hemorrhoids. Note for "Hemorrhoids": Patient comes for a second opinion over concern of hemorrhoids. Sent for surgical consultation by her primary care physician, Dr. Sinda Du.  Pleasant woman that is struggle with hemorrhoids ever since she had her children decades ago. Usually has a bowel movement every 2-3 days. Had a colonoscopy 2013 that was otherwise normal. She's noticed some hemorrhoidal intermittently pop out. Often bleeding. Sometimes irritated. Gradually worsened. She feels them out all the time. Has to wear a pad because she has chronic leakage. Frustrating. There was concerned perhaps she had some type of rectal prolapse or rectocele. She was offered hemorrhoidectomy in the office by a different surgeon. She did not wish to try that. She is on Eliquis anticoagulation for history of recurrent atrial fibrillation and flutter. She's had ablation of I believe flutter. Say pacemaker out of sorts as well. She is followed by [facility] cardiology group. Dr. Rayann Heman.  Cleared for surgery.  Eliquis held periop.   Other Problems Erika Lee, CMA; 07/24/2015 3:27 PM) Arthritis Back Pain Diabetes Mellitus Gastroesophageal Reflux Disease Hemorrhoids High blood pressure Hypercholesterolemia  Past Surgical History Erika Lee, CMA; 07/24/2015 3:27 PM) Tonsillectomy  Diagnostic Studies History Erika Lee, CMA; 07/24/2015 3:27 PM) Colonoscopy 1-5 years ago Mammogram 1-3 years ago Pap Smear 1-5 years ago  Allergies Erika Lee, CMA; 07/24/2015 3:29 PM) Clindamycin HCl *ANTI-INFECTIVE AGENTS - MISC.* Doxycycline Hyclate *TETRACYCLINES* Adhesive Tape *MEDICAL DEVICES AND  SUPPLIES* Latex  Medication History Erika Lee, CMA; 07/24/2015 3:32 PM) Eliquis (5MG  Tablet, Oral) Active. Hydrocodone-Acetaminophen (7.5-325MG  Tablet, Oral) Active. HydroCHLOROthiazide (25MG  Tablet, Oral) Active. Lisinopril (10MG  Tablet, Oral) Active. MetFORMIN HCl (500MG  Tablet, Oral) Active. Metoprolol Succinate ER (25MG  Tablet ER 24HR, Oral) Active. Simvastatin (40MG  Tablet, Oral) Active. Potassium Gluconate (550MG  Tablet, Oral) Active. Medications Reconciled  Social History Erika Lee, CMA; 07/24/2015 3:27 PM) Alcohol use Remotely quit alcohol use. No caffeine use No drug use Tobacco use Former smoker.  Family History Erika Lee, [place]; 07/24/2015 3:27 PM) Family history unknown First Degree Relatives  Pregnancy / Birth History Erika Lee, CMA; 07/24/2015 3:27 PM) Age at menarche 6 years. Age of menopause <43 Gravida 2 Maternal age 81-20 Para 2    Review of Systems Erika Lee CMA; 07/24/2015 3:27 PM) General Not Present- Appetite Loss, Chills, Fatigue, Fever, Night Sweats, Weight Gain and Weight Loss. Skin Not Present- Change in Wart/Mole, Dryness, Hives, Jaundice, New Lesions, Non-Healing Wounds, Rash and Ulcer. HEENT Present- Seasonal Allergies and Wears glasses/contact lenses. Not Present- Earache, Hearing Loss, Hoarseness, Nose Bleed, Oral Ulcers, Ringing in the Ears, Sinus Pain, Sore Throat, Visual Disturbances and Yellow Eyes. Respiratory Not Present- Bloody sputum, Chronic Cough, Difficulty Breathing, Snoring and Wheezing. Breast Not Present- Breast Mass, Breast Pain, Nipple Discharge and Skin Changes. Cardiovascular Not Present- Chest Pain, Difficulty Breathing Lying Down, Leg Cramps, Palpitations, Rapid Heart Rate, Shortness of Breath and Swelling of Extremities. Gastrointestinal Present- Hemorrhoids. Not Present- Abdominal Pain, Bloating, Bloody Stool, Change in Bowel Habits, Chronic diarrhea, Constipation, Difficulty  Swallowing, Excessive gas, Gets full quickly at meals, Indigestion, Nausea, Rectal Pain and Vomiting. Female Genitourinary Not Present- Frequency, Nocturia, Painful Urination, Pelvic Pain and Urgency. Musculoskeletal Present- Back Pain and Joint Pain. Not Present- Joint Stiffness, Muscle Pain, Muscle Weakness and Swelling of Extremities.  Neurological Not Present- Decreased Memory, Fainting, Headaches, Numbness, Seizures, Tingling, Tremor, Trouble walking and Weakness. Psychiatric Not Present- Anxiety, Bipolar, Change in Sleep Pattern, Depression, Fearful and Frequent crying. Endocrine Not Present- Cold Intolerance, Excessive Hunger, Hair Changes, Heat Intolerance, Hot flashes and New Diabetes. Hematology Not Present- Easy Bruising, Excessive bleeding, Gland problems, HIV and Persistent Infections.  Vitals ([name] CMA; 07/24/2015 3:28 PM) 07/24/2015 3:28 PM Weight: 177.6 lb Height: 65in Body Surface Area: 1.88 m Body Mass Index: 29.55 kg/m  Temp.: 98.45F(Oral)  BP: 124/72 (Sitting, Left Arm, Standard)       Physical Exam Adin Hector MD; 07/24/2015 4:34 PM) General Mental Status-Alert. General Appearance-Not in acute distress, Not Sickly. Orientation-Oriented X3. Hydration-Well hydrated. Voice-Normal.  Integumentary Global Assessment Upon inspection and palpation of skin surfaces of the - Axillae: non-tender, no inflammation or ulceration, no drainage. and Distribution of scalp and body hair is normal. General Characteristics Temperature - normal warmth is noted.  Head and Neck Head-normocephalic, atraumatic with no lesions or palpable masses. Face Global Assessment - atraumatic, no absence of expression. Neck Global Assessment - no abnormal movements, no bruit auscultated on the right, no bruit auscultated on the left, no decreased range of motion, non-tender. Trachea-midline. Thyroid Gland Characteristics - non-tender.  Eye Eyeball -  Left-Extraocular movements intact, No Nystagmus. Eyeball - Right-Extraocular movements intact, No Nystagmus. Cornea - Left-No Hazy. Cornea - Right-No Hazy. Sclera/Conjunctiva - Left-No scleral icterus, No Discharge. Sclera/Conjunctiva - Right-No scleral icterus, No Discharge. Pupil - Left-Direct reaction to light normal. Pupil - Right-Direct reaction to light normal.  ENMT Ears Pinna - Left - no drainage observed, no generalized tenderness observed. Right - no drainage observed, no generalized tenderness observed. Nose and Sinuses External Inspection of the Nose - no destructive lesion observed. Inspection of the nares - Left - quiet respiration. Right - quiet respiration. Mouth and Throat Lips - Upper Lip - no fissures observed, no pallor noted. Lower Lip - no fissures observed, no pallor noted. Nasopharynx - no discharge present. Oral Cavity/Oropharynx - Tongue - no dryness observed. Oral Mucosa - no cyanosis observed. Hypopharynx - no evidence of airway distress observed.  Chest and Lung Exam Inspection Movements - Normal and Symmetrical. Accessory muscles - No use of accessory muscles in breathing. Palpation Palpation of the chest reveals - Non-tender. Auscultation Breath sounds - Normal and Clear.  Cardiovascular Auscultation Rhythm - Regular. Murmurs & Other Heart Sounds - Auscultation of the heart reveals - No Murmurs and No Systolic Clicks.  Abdomen Inspection Inspection of the abdomen reveals - No Visible peristalsis and No Abnormal pulsations. Umbilicus - No Bleeding, No Urine drainage. Palpation/Percussion Palpation and Percussion of the abdomen reveal - Soft, Non Tender, No Rebound tenderness, No Rigidity (guarding) and No Cutaneous hyperesthesia. Note: Soft. Nondistended non tender. No diastases. No umbilical hernia.   Female Genitourinary Sexual Maturity Tanner 5 - Adult hair pattern. Note: No vaginal bleeding nor discharge   Rectal Note:  Exam done with assistance of female Medical Assistant in the room.   Chronic prolapsed friable large RIGHT anterior hemorrhoid. RIGHT anterior pile. Significant external hemorrhoids times 3 piles.   Perianal skin clean with good hygiene. No pruritis ani. No pilonidal disease. No fissure. No abscess/fistula. Normal sphincter tone. Tolerates digital and anoscopic rectal exam. No rectal masses.   Peripheral Vascular Upper Extremity Inspection - Left - No Cyanotic nailbeds, Not Ischemic. Right - No Cyanotic nailbeds, Not Ischemic.  Neurologic Neurologic evaluation reveals -normal attention span and ability to concentrate, able to  name objects and repeat phrases. Appropriate fund of knowledge , normal sensation and normal coordination. Mental Status Affect - not angry, not paranoid. Cranial Nerves-Normal Bilaterally. Gait-Normal.  Neuropsychiatric Mental status exam performed with findings of-able to articulate well with normal speech/language, rate, volume and coherence, thought content normal with ability to perform basic computations and apply abstract reasoning and no evidence of hallucinations, delusions, obsessions or homicidal/suicidal ideation.  Musculoskeletal Global Assessment Spine, Ribs and Pelvis - no instability, subluxation or laxity. Right Upper Extremity - no instability, subluxation or laxity.  Lymphatic Head & Neck  General Head & Neck Lymphatics: Bilateral - Description - No Localized lymphadenopathy. Axillary  General Axillary Region: Bilateral - Description - No Localized lymphadenopathy. Femoral & Inguinal  Generalized Femoral & Inguinal Lymphatics: Left - Description - No Localized lymphadenopathy. Right - Description - No Localized lymphadenopathy.   Results Adin Hector MD; 07/24/2015 5:31 PM) Procedures  Name Value Date ANOSCOPY, DIAGNOSTIC [phone]) [ Hemorrhoids ] Procedure Anal exam: External Hemorrhoid prolapse Internal  exam: Internal Hemorroids ( non-bleeding) prolapse Other: Please see rectal surgery section. Chronically prolapsed internal hemorrhoid with external hemorrhoids. Some internal hemorrhoids grade 2/3  Performed: 07/24/2015 5:30 PM    Assessment & Plan  PROLAPSED INTERNAL HEMORRHOIDS, GRADE 4 (K64.3)  Impression: Large chronically prolapsed hemorrhoid with leakage and mild incontinence. No sphincter defect. No true rectal I think this requires removal. She doesn't want on the office. I do not think that's realistic. Good candidate for THD ligation. Her like cardiac clearance to make sure safe to come off anticoagulation. She sees Dr. Rayann Heman and his group rather regularly.  CARDIAC CLEARANCE DONE.  Ready for surgery  Current Plans Pt Education - [facility] Hemorrhoids (): discussed with patient and provided information. Started Anusol-HC [id number], 1 (one) Application three times daily, as needed, 1 Tube, 07/24/2015, Ref. x3. Pt Education - [facility] Rectal Prep for Anorectal outpatient/office surgery: discussed with patient and provided information. Pt Education - [facility] Rectal Surgery HCI (): discussed with patient and provided information. You are being scheduled for surgery - Our schedulers will call you.  You should hear from our office's scheduling department within 5 working days about the location, date, and time of surgery. We try to make accommodations for patient's preferences in scheduling surgery, but sometimes the OR schedule or the surgeon's schedule prevents [place] from making those accommodations.  If you have not heard from our office [phone]) in 5 working days, call the office and ask for your surgeon's nurse.  If you have other questions about your diagnosis, plan, or surgery, call the office and ask for your surgeon's nurse.  The anatomy & physiology of the anorectal region was discussed. The pathophysiology of hemorrhoids and differential diagnosis was discussed. Natural  history risks without surgery was discussed. I stressed the importance of a bowel regimen to have daily soft bowel movements to minimize progression of disease. Interventions such as sclerotherapy & banding were discussed.  The patient's symptoms are not adequately controlled by medicines and other non-operative treatments. I feel the risks & problems of no surgery outweigh the operative risks; therefore, I recommended surgery to treat the hemorrhoids by ligation, pexy, and possible resection.  Risks such as bleeding, infection, urinary difficulties, need for further treatment, heart attack, death, and other risks were discussed. I noted a good likelihood this will help address the problem. Goals of post-operative recovery were discussed as well. Possibility that this will not correct all symptoms was explained. Post-operative pain, bleeding, constipation, and other problems  after surgery were discussed. We will work to minimize complications. Educational handouts further explaining the pathology, treatment options, and bowel regimen were given as well. Questions were answered. The patient expresses understanding & wishes to proceed with surgery.  Pt Education - Pamphlet Given - The Hemorrhoid Book: discussed with patient and provided information. ANOSCOPY, DIAGNOSTIC [id number]) EXTERNAL HEMORRHOIDS WITH COMPLICATION ([id number]) Impression: Consider removing at least part of them if not tucked back in with [place]. PROLAPSED INTERNAL HEMORRHOIDS, GRADE 3 (K64.2) Impression: Involving RIGHT posterior and LEFT lateral piles. Hopefully can be treated with hemorrhoidal ligation and not excision.  Adin Hector, M.D., F.A.C.S. Gastrointestinal and Minimally Invasive Surgery [facility] [address], [place], [place] [address] [phone] Main / Paging

## 2015-09-22 NOTE — Anesthesia Postprocedure Evaluation (Signed)
Anesthesia Post Note  Patient: Erika Lee  Procedure(s) Performed: Procedure(s) (LRB): TRANSANAL HEMORRHOIDAL LIGATION/PEXY EUA POSSIBLE HEMORRHOIDECTOMY  (N/A)  Patient location during evaluation: PACU Anesthesia Type: General Level of consciousness: awake and alert, oriented and patient cooperative Pain management: pain level controlled Vital Signs Assessment: post-procedure vital signs reviewed and stable Respiratory status: spontaneous breathing, nonlabored ventilation and respiratory function stable Cardiovascular status: blood pressure returned to baseline and stable Postop Assessment: no signs of nausea or vomiting Anesthetic complications: no    Last Vitals:  Filed Vitals:   09/22/15 1530 09/22/15 1551  BP: 141/72 141/63  Pulse: 50 49  Temp: 36.3 C 36.4 C  Resp: 15 12    Last Pain:  Filed Vitals:   09/22/15 1652  PainSc: 4                  ,E. 

## 2015-09-25 ENCOUNTER — Encounter (HOSPITAL_COMMUNITY): Payer: Self-pay | Admitting: Surgery

## 2015-10-02 ENCOUNTER — Ambulatory Visit (INDEPENDENT_AMBULATORY_CARE_PROVIDER_SITE_OTHER): Payer: PPO | Admitting: *Deleted

## 2015-10-02 DIAGNOSIS — I4891 Unspecified atrial fibrillation: Secondary | ICD-10-CM | POA: Diagnosis not present

## 2015-10-02 NOTE — Progress Notes (Signed)
Carelink Summary Report / Loop Recorder 

## 2015-10-10 LAB — CUP PACEART REMOTE DEVICE CHECK: Date Time Interrogation Session: 20170131193929

## 2015-10-12 DIAGNOSIS — M1711 Unilateral primary osteoarthritis, right knee: Secondary | ICD-10-CM | POA: Diagnosis not present

## 2015-10-12 DIAGNOSIS — M25561 Pain in right knee: Secondary | ICD-10-CM | POA: Diagnosis not present

## 2015-10-19 DIAGNOSIS — M1711 Unilateral primary osteoarthritis, right knee: Secondary | ICD-10-CM | POA: Diagnosis not present

## 2015-10-19 DIAGNOSIS — M25561 Pain in right knee: Secondary | ICD-10-CM | POA: Diagnosis not present

## 2015-10-26 DIAGNOSIS — M25561 Pain in right knee: Secondary | ICD-10-CM | POA: Diagnosis not present

## 2015-10-26 DIAGNOSIS — M1711 Unilateral primary osteoarthritis, right knee: Secondary | ICD-10-CM | POA: Diagnosis not present

## 2015-10-30 ENCOUNTER — Ambulatory Visit (INDEPENDENT_AMBULATORY_CARE_PROVIDER_SITE_OTHER): Payer: PPO | Admitting: *Deleted

## 2015-10-30 DIAGNOSIS — I4891 Unspecified atrial fibrillation: Secondary | ICD-10-CM | POA: Diagnosis not present

## 2015-10-31 NOTE — Progress Notes (Signed)
Carelink Summary Report / Loop Recorder 

## 2015-11-02 DIAGNOSIS — M1711 Unilateral primary osteoarthritis, right knee: Secondary | ICD-10-CM | POA: Diagnosis not present

## 2015-11-02 DIAGNOSIS — M25561 Pain in right knee: Secondary | ICD-10-CM | POA: Diagnosis not present

## 2015-11-13 DIAGNOSIS — M9901 Segmental and somatic dysfunction of cervical region: Secondary | ICD-10-CM | POA: Diagnosis not present

## 2015-11-13 DIAGNOSIS — M542 Cervicalgia: Secondary | ICD-10-CM | POA: Diagnosis not present

## 2015-11-14 DIAGNOSIS — M9901 Segmental and somatic dysfunction of cervical region: Secondary | ICD-10-CM | POA: Diagnosis not present

## 2015-11-14 DIAGNOSIS — M542 Cervicalgia: Secondary | ICD-10-CM | POA: Diagnosis not present

## 2015-11-15 DIAGNOSIS — M542 Cervicalgia: Secondary | ICD-10-CM | POA: Diagnosis not present

## 2015-11-15 DIAGNOSIS — M9901 Segmental and somatic dysfunction of cervical region: Secondary | ICD-10-CM | POA: Diagnosis not present

## 2015-11-17 DIAGNOSIS — T148 Other injury of unspecified body region: Secondary | ICD-10-CM | POA: Diagnosis not present

## 2015-11-21 DIAGNOSIS — R070 Pain in throat: Secondary | ICD-10-CM | POA: Diagnosis not present

## 2015-11-21 DIAGNOSIS — R110 Nausea: Secondary | ICD-10-CM | POA: Diagnosis not present

## 2015-11-25 LAB — CUP PACEART REMOTE DEVICE CHECK: Date Time Interrogation Session: 20170401203653

## 2015-11-25 NOTE — Progress Notes (Signed)
Carelink summary report received. Battery status OK. Normal device function. No new symptom episodes, tachy episodes, brady, or pause episodes. No new AF episodes. Monthly summary reports and ROV/PRN 

## 2015-11-28 DIAGNOSIS — R05 Cough: Secondary | ICD-10-CM | POA: Diagnosis not present

## 2015-11-28 DIAGNOSIS — R070 Pain in throat: Secondary | ICD-10-CM | POA: Diagnosis not present

## 2015-11-29 ENCOUNTER — Ambulatory Visit (INDEPENDENT_AMBULATORY_CARE_PROVIDER_SITE_OTHER): Payer: PPO | Admitting: *Deleted

## 2015-11-29 DIAGNOSIS — I480 Paroxysmal atrial fibrillation: Secondary | ICD-10-CM

## 2015-11-30 NOTE — Progress Notes (Signed)
Carelink Summary Report / Loop Recorder 

## 2015-12-07 ENCOUNTER — Encounter (HOSPITAL_COMMUNITY): Payer: Self-pay | Admitting: Nurse Practitioner

## 2015-12-07 ENCOUNTER — Ambulatory Visit (HOSPITAL_COMMUNITY)
Admission: RE | Admit: 2015-12-07 | Discharge: 2015-12-07 | Disposition: A | Discharge status: Home / Self Care | Payer: PPO | Source: Ambulatory Visit | Attending: Nurse Practitioner | Admitting: Nurse Practitioner

## 2015-12-07 VITALS — BP 136/72 | HR 48 | Ht 65.0 in | Wt 168.4 lb

## 2015-12-07 DIAGNOSIS — Z823 Family history of stroke: Secondary | ICD-10-CM | POA: Insufficient documentation

## 2015-12-07 DIAGNOSIS — Z882 Allergy status to sulfonamides status: Secondary | ICD-10-CM | POA: Diagnosis not present

## 2015-12-07 DIAGNOSIS — Z9889 Other specified postprocedural states: Secondary | ICD-10-CM | POA: Diagnosis not present

## 2015-12-07 DIAGNOSIS — Z881 Allergy status to other antibiotic agents status: Secondary | ICD-10-CM | POA: Diagnosis not present

## 2015-12-07 DIAGNOSIS — I480 Paroxysmal atrial fibrillation: Secondary | ICD-10-CM

## 2015-12-07 DIAGNOSIS — I10 Essential (primary) hypertension: Secondary | ICD-10-CM | POA: Insufficient documentation

## 2015-12-07 DIAGNOSIS — Z79899 Other long term (current) drug therapy: Secondary | ICD-10-CM | POA: Diagnosis not present

## 2015-12-07 DIAGNOSIS — R7303 Prediabetes: Secondary | ICD-10-CM | POA: Insufficient documentation

## 2015-12-07 DIAGNOSIS — Z91040 Latex allergy status: Secondary | ICD-10-CM | POA: Diagnosis not present

## 2015-12-07 DIAGNOSIS — Z87891 Personal history of nicotine dependence: Secondary | ICD-10-CM | POA: Diagnosis not present

## 2015-12-07 DIAGNOSIS — Z7901 Long term (current) use of anticoagulants: Secondary | ICD-10-CM | POA: Insufficient documentation

## 2015-12-07 DIAGNOSIS — E785 Hyperlipidemia, unspecified: Secondary | ICD-10-CM | POA: Insufficient documentation

## 2015-12-07 DIAGNOSIS — K219 Gastro-esophageal reflux disease without esophagitis: Secondary | ICD-10-CM | POA: Insufficient documentation

## 2015-12-07 DIAGNOSIS — Z7984 Long term (current) use of oral hypoglycemic drugs: Secondary | ICD-10-CM | POA: Insufficient documentation

## 2015-12-07 DIAGNOSIS — I4891 Unspecified atrial fibrillation: Secondary | ICD-10-CM | POA: Insufficient documentation

## 2015-12-07 NOTE — Progress Notes (Signed)
Patient ID: Erika Lee, female   DOB: October 12, 1945, 70 y.o.   MRN: [id number]      Primary Care Physician: Alonza Bogus, MD Referring Physician: Dr. Talbot Grumbling Erika Lee is a 70 y.o. female with a h/o afib s/p ablation, that is in afib clinic for f/u. She reports that she is doing well without any symptoms of afib. No bleeding issues with apixaban. Overall, she is feeling well. Heart rate at home upper 40's to mid 50's. Not symptomatic with low heart rate.Recently had hemorrhoid surgery without any issues.  Today, she denies symptoms of palpitations, chest pain, shortness of breath, orthopnea, PND, lower extremity edema, dizziness, presyncope, syncope, or neurologic sequela. The patient is tolerating medications without difficulties and is otherwise without complaint today.   Past Medical History  Diagnosis Date  . Hyperlipidemia   . Multinodular goiter   . Paroxysmal atrial fibrillation (HCC)     s/p PVI by [name] 07/16/2013  . GERD (gastroesophageal reflux disease)   . Essential hypertension, benign   . Palpitations   . Diabetes mellitus     Borderline  . Arthritis   . Atrial flutter ([place])     s/p CTI by [name] 07/16/2013  . Hemorrhoids    Past Surgical History  Procedure Laterality Date  . Biopsy thyroid    . Tonsillectomy    . Colonoscopy  09/10/2011    Procedure: COLONOSCOPY;  Surgeon: Jamesetta So, MD;  [facility];  Service: Gastroenterology;  Laterality: N/A;  . Lesion removal N/A 05/03/2013    Procedure: EXCISION CYST, BACK;  Surgeon: Jamesetta So, MD;  Location: [facility] ORS;  Service: General;  Laterality: N/A;  . Tee without cardioversion N/A 07/15/2013    Procedure: TRANSESOPHAGEAL ECHOCARDIOGRAM (TEE);  Surgeon: Lelon Perla, MD;  Location: [facility] ENDOSCOPY;  Service: Cardiovascular;  Laterality: N/A;  . Atrial fibrillation ablation N/A 07/16/2013    PVI and CTI ablation by [name]  . Loop recorder implant N/A 07/26/2014    Procedure: LOOP  RECORDER IMPLANT;  Surgeon: Thompson Grayer, MD;  Location: [facility] CATH LAB;  Service: Cardiovascular;  Laterality: N/A;  . Transanal hemorrhoidal dearterialization N/A 09/22/2015    Procedure: TRANSANAL HEMORRHOIDAL LIGATION/PEXY EUA POSSIBLE HEMORRHOIDECTOMY ;  Surgeon: Michael Boston, MD;  Location: [facility] ORS;  Service: General;  Laterality: N/A;    Current Outpatient Prescriptions  Medication Sig Dispense Refill  . apixaban (ELIQUIS) 5 MG TABS tablet Take 1 tablet (5 mg total) by mouth 2 (two) times daily. 60 tablet 12  . esomeprazole (NEXIUM) 20 MG capsule Take 20 mg by mouth daily.     . hydrochlorothiazide 25 MG tablet Take 25 mg by mouth daily.      Marland Kitchen HYDROcodone-acetaminophen (NORCO) 7.5-325 MG tablet Take 1-2 tablets by mouth every 4 (four) hours as needed for moderate pain or severe pain. 50 tablet 0  . lisinopril (PRINIVIL,ZESTRIL) 10 MG tablet Take 10 mg by mouth daily.      Marland Kitchen loratadine (WAL-ITIN) 10 MG tablet Take 10 mg by mouth daily.    . metFORMIN (GLUCOPHAGE) 500 MG tablet Take 1 tablet (500 mg total) by mouth daily.    . metoprolol succinate (TOPROL XL) 25 MG 24 hr tablet Take 0.5 tablets (12.5 mg total) by mouth daily. 60 tablet 1  . Potassium Gluconate 550 MG TABS Take 550 mg by mouth daily.     . simvastatin (ZOCOR) 40 MG tablet Take 40 mg by mouth at bedtime.      Marland Kitchen  docusate sodium (COLACE) 100 MG capsule Take 100 mg by mouth at bedtime as needed for mild constipation. Reported on 12/07/2015     No current facility-administered medications for this encounter.    Allergies  Allergen Reactions  . Clindamycin/Lincomycin Rash  . Doxycycline Other (See Comments)    Makes heart race  . Keflex [Cephalexin] Nausea And Vomiting  . Adhesive [Tape] Rash  . Latex Rash  . Sulfonamide Derivatives Nausea And Vomiting    Social History   Social History  . Marital Status: Married    Spouse Name: N/A  . Number of Children: N/A  . Years of Education: N/A   Occupational History  .  Trucking Business     Full time   Social History Main Topics  . Smoking status: Former Smoker    Types: Cigarettes    Quit date: 01/02/1994  . Smokeless tobacco: Never Used  . Alcohol Use: No  . Drug Use: No  . Sexual Activity: Not on file   Other Topics Concern  . Not on file   Social History Narrative   Lives with spouse in [place]    Family History  Problem Relation Age of Onset  . Stroke Mother 90  . Stroke Maternal Grandmother     stroke  . Other Maternal Grandfather     "heart dropsy" kidney issues  . Kidney disease Maternal Grandfather     ROS- All systems are reviewed and negative except as per the HPI above  Physical Exam: Filed Vitals:   12/07/15 1129  BP: 136/72  Pulse: 48  Height: 5\' 5"  (1.651 m)  Weight: 168 lb 6.4 oz (76.386 kg)    GEN- The patient is well appearing, alert and oriented x 3 today.   Head- normocephalic, atraumatic Eyes-  Sclera clear, conjunctiva pink Ears- hearing intact Oropharynx- clear Neck- supple, no JVP Lymph- no cervical lymphadenopathy Lungs- Clear to ausculation bilaterally, normal work of breathing Heart- Regular rate and rhythm, no murmurs, rubs or gallops, PMI not laterally displaced GI- soft, NT, ND, + BS Extremities- no clubbing, cyanosis, or edema MS- no significant deformity or atrophy Skin- no rash or lesion Psych- euthymic mood, full affect Neuro- strength and sensation are intact  EKG- Sinus brady at 48 bpm. Pr int 178 ms, qtrs 94 ms, qtc 404 ms.  Assessment and Plan: 1. S/p afib ablation 1/15. Maitaining SR s/p abaltion Doing well  Continue metoprolol watching for symptomatic brady Continue apixaban  Recheck in 6 months with Dr. Lawrence Marseilles C. , [facility] [address] [place], [place] [address] [phone]

## 2015-12-08 LAB — CUP PACEART REMOTE DEVICE CHECK: MDC IDC SESS DTM: 20170501210727

## 2015-12-25 DIAGNOSIS — M179 Osteoarthritis of knee, unspecified: Secondary | ICD-10-CM | POA: Diagnosis not present

## 2015-12-25 DIAGNOSIS — I10 Essential (primary) hypertension: Secondary | ICD-10-CM | POA: Diagnosis not present

## 2015-12-25 DIAGNOSIS — M545 Low back pain: Secondary | ICD-10-CM | POA: Diagnosis not present

## 2015-12-25 DIAGNOSIS — I4891 Unspecified atrial fibrillation: Secondary | ICD-10-CM | POA: Diagnosis not present

## 2015-12-29 ENCOUNTER — Ambulatory Visit (INDEPENDENT_AMBULATORY_CARE_PROVIDER_SITE_OTHER): Payer: PPO | Admitting: *Deleted

## 2015-12-29 DIAGNOSIS — I4891 Unspecified atrial fibrillation: Secondary | ICD-10-CM

## 2016-01-01 NOTE — Progress Notes (Signed)
Carelink Summary Report / Loop Recorder 

## 2016-01-08 ENCOUNTER — Other Ambulatory Visit (HOSPITAL_COMMUNITY): Payer: Self-pay | Admitting: Pulmonary Disease

## 2016-01-08 ENCOUNTER — Ambulatory Visit (HOSPITAL_COMMUNITY)
Admission: RE | Admit: 2016-01-08 | Discharge: 2016-01-08 | Disposition: A | Discharge status: Home / Self Care | Payer: PPO | Source: Ambulatory Visit | Attending: Pulmonary Disease | Admitting: Pulmonary Disease

## 2016-01-08 DIAGNOSIS — R42 Dizziness and giddiness: Secondary | ICD-10-CM | POA: Diagnosis not present

## 2016-01-08 DIAGNOSIS — R51 Headache: Principal | ICD-10-CM

## 2016-01-08 DIAGNOSIS — I10 Essential (primary) hypertension: Secondary | ICD-10-CM | POA: Diagnosis not present

## 2016-01-08 DIAGNOSIS — I4891 Unspecified atrial fibrillation: Secondary | ICD-10-CM | POA: Diagnosis not present

## 2016-01-08 DIAGNOSIS — R2689 Other abnormalities of gait and mobility: Secondary | ICD-10-CM | POA: Diagnosis not present

## 2016-01-08 DIAGNOSIS — R519 Headache, unspecified: Secondary | ICD-10-CM

## 2016-01-08 LAB — CUP PACEART REMOTE DEVICE CHECK
Date Time Interrogation Session: 20170531213956
MDC IDC SESS DTM: 20170630223741

## 2016-01-09 DIAGNOSIS — M542 Cervicalgia: Secondary | ICD-10-CM | POA: Diagnosis not present

## 2016-01-09 DIAGNOSIS — M9901 Segmental and somatic dysfunction of cervical region: Secondary | ICD-10-CM | POA: Diagnosis not present

## 2016-01-10 DIAGNOSIS — M9901 Segmental and somatic dysfunction of cervical region: Secondary | ICD-10-CM | POA: Diagnosis not present

## 2016-01-10 DIAGNOSIS — M542 Cervicalgia: Secondary | ICD-10-CM | POA: Diagnosis not present

## 2016-01-11 DIAGNOSIS — M542 Cervicalgia: Secondary | ICD-10-CM | POA: Diagnosis not present

## 2016-01-11 DIAGNOSIS — M9901 Segmental and somatic dysfunction of cervical region: Secondary | ICD-10-CM | POA: Diagnosis not present

## 2016-01-12 DIAGNOSIS — M542 Cervicalgia: Secondary | ICD-10-CM | POA: Diagnosis not present

## 2016-01-12 DIAGNOSIS — M9901 Segmental and somatic dysfunction of cervical region: Secondary | ICD-10-CM | POA: Diagnosis not present

## 2016-01-22 DIAGNOSIS — M542 Cervicalgia: Secondary | ICD-10-CM | POA: Diagnosis not present

## 2016-01-22 DIAGNOSIS — M9901 Segmental and somatic dysfunction of cervical region: Secondary | ICD-10-CM | POA: Diagnosis not present

## 2016-01-24 DIAGNOSIS — M9901 Segmental and somatic dysfunction of cervical region: Secondary | ICD-10-CM | POA: Diagnosis not present

## 2016-01-24 DIAGNOSIS — M542 Cervicalgia: Secondary | ICD-10-CM | POA: Diagnosis not present

## 2016-01-26 DIAGNOSIS — M542 Cervicalgia: Secondary | ICD-10-CM | POA: Diagnosis not present

## 2016-01-26 DIAGNOSIS — M9901 Segmental and somatic dysfunction of cervical region: Secondary | ICD-10-CM | POA: Diagnosis not present

## 2016-01-29 ENCOUNTER — Ambulatory Visit (INDEPENDENT_AMBULATORY_CARE_PROVIDER_SITE_OTHER): Payer: PPO | Admitting: *Deleted

## 2016-01-29 DIAGNOSIS — M9901 Segmental and somatic dysfunction of cervical region: Secondary | ICD-10-CM | POA: Diagnosis not present

## 2016-01-29 DIAGNOSIS — I480 Paroxysmal atrial fibrillation: Secondary | ICD-10-CM | POA: Diagnosis not present

## 2016-01-29 DIAGNOSIS — M542 Cervicalgia: Secondary | ICD-10-CM | POA: Diagnosis not present

## 2016-01-29 NOTE — Progress Notes (Signed)
Carelink Summary Report / Loop Recorder 

## 2016-01-31 DIAGNOSIS — M9901 Segmental and somatic dysfunction of cervical region: Secondary | ICD-10-CM | POA: Diagnosis not present

## 2016-01-31 DIAGNOSIS — M542 Cervicalgia: Secondary | ICD-10-CM | POA: Diagnosis not present

## 2016-02-02 LAB — CUP PACEART REMOTE DEVICE CHECK: Date Time Interrogation Session: 20170730223854

## 2016-02-06 DIAGNOSIS — R42 Dizziness and giddiness: Secondary | ICD-10-CM | POA: Diagnosis not present

## 2016-02-06 DIAGNOSIS — I10 Essential (primary) hypertension: Secondary | ICD-10-CM | POA: Diagnosis not present

## 2016-02-06 DIAGNOSIS — I4891 Unspecified atrial fibrillation: Secondary | ICD-10-CM | POA: Diagnosis not present

## 2016-02-06 DIAGNOSIS — E119 Type 2 diabetes mellitus without complications: Secondary | ICD-10-CM | POA: Diagnosis not present

## 2016-02-15 ENCOUNTER — Encounter: Payer: Self-pay | Admitting: Neurology

## 2016-02-15 ENCOUNTER — Ambulatory Visit (INDEPENDENT_AMBULATORY_CARE_PROVIDER_SITE_OTHER): Payer: PPO | Admitting: Neurology

## 2016-02-15 VITALS — BP 114/81 | HR 55 | Ht 65.0 in | Wt 167.4 lb

## 2016-02-15 DIAGNOSIS — R42 Dizziness and giddiness: Secondary | ICD-10-CM | POA: Diagnosis not present

## 2016-02-15 DIAGNOSIS — M542 Cervicalgia: Secondary | ICD-10-CM | POA: Diagnosis not present

## 2016-02-15 DIAGNOSIS — G450 Vertebro-basilar artery syndrome: Secondary | ICD-10-CM

## 2016-02-15 NOTE — Patient Instructions (Signed)
I had a long discussion with the patient with regards to his symptoms of intermittent wooziness following visit to a beauty par[place] possibly related to vertebrobasilar insufficiency versus cervical arthritis. I asked her to avoid doing neck extension activities. Check MRI scan of the brain and cervical spine as well as CT angiogram of the brain and neck. I have given her neck stretching exercises to help with him posterior neck pain. Return for follow-up in 2 months or call earlier if necessary.

## 2016-02-16 NOTE — Progress Notes (Signed)
[facility] [address] [place]. [place] [address] [phone]       OFFICE CONSULT NOTE  [name] Date of Birth:  July 23, 1945 Medical Record Number:  [id number]   Referring MD:  Sinda Du  Reason for Referral:  Dizziness and neck pain  HPI: Erika Lee is a 70 year old pleasant Caucasian lady with history of hypertension, diabetes, hyperlipidemia, atrial fibrillation, gastroesophageal reflux disease who the last 3 weeks describes a sensation of "" head floating this may occur at any time and she feels funny. She states this is not true dizziness or vertigo. This may, come on suddenly without warning. She feels the symptoms began after she had been to a beauty parlor and spent her neck in extension position for a while. She has also been to a chiropractor but this feeling has not gone away. She does have chronic posterior neck pain and has known arthritis in the neck. She however denies any shooting pain down her spine or into her shoulder or arms. She denies tingling numbness or weakness in her hands. She has no trouble with walking balance or any falls. She did have MRI scan of cervical spine done in 2011 which I personally reviewed the images shows moderate degenerative changes with spinal stenosis at  C3-4, C 4-5 and C 5-6. She also has some intermittent midthoracic curve pain as well. She denies any recent motor vehicle accident, neck injury or jerking. 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 Patient did undergo CT scan of the head the on 7//10 17  films of which I personally reviewed show no acute abnormality. She has no prior history of strokes, TIA, seizures significant neurological problems. She does complain of some dizziness and lightheadedness when she raises her arm above her shoulders or looks up but this feeling is only transient. ROS:   14 system review of systems is positive for  neck pain, floating sensation, dizziness, aching muscles  however the systems negative  PMH:  Past Medical History:  Diagnosis Date  . Arthritis   . Atrial flutter ([place])    s/p CTI by [name] 07/16/2013  . Diabetes mellitus    Borderline  . Essential hypertension, benign   . GERD (gastroesophageal reflux disease)   . Hemorrhoids   . Hyperlipidemia   . Multinodular goiter   . Osteoarthritis   . Palpitations   . Paroxysmal atrial fibrillation (HCC)    s/p PVI by [name] 07/16/2013    Social History:  Social History   Social History  . Marital status: Married    Spouse name: N/A  . Number of children: N/A  . Years of education: N/A   Occupational History  . Trucking Business Retired    Full time   Social History Main Topics  . Smoking status: Former Smoker    Types: Cigarettes    Quit date: 01/02/1994  . Smokeless tobacco: Never Used  . Alcohol use No  . Drug use: No  . Sexual activity: Not on file   Other Topics Concern  . Not on file   Social History Narrative   Lives with spouse in [place]    Medications:   Current Outpatient Prescriptions on File Prior to Visit  Medication Sig Dispense Refill  . apixaban (ELIQUIS) 5 MG TABS tablet Take 1 tablet (5 mg total) by mouth 2 (two) times daily. 60 tablet 12  . esomeprazole (NEXIUM) 20 MG capsule Take 20 mg by mouth daily.     Marland Kitchen  hydrochlorothiazide 25 MG tablet Take 25 mg by mouth daily.      Marland Kitchen HYDROcodone-acetaminophen (NORCO) 7.5-325 MG tablet Take 1-2 tablets by mouth every 4 (four) hours as needed for moderate pain or severe pain. 50 tablet 0  . lisinopril (PRINIVIL,ZESTRIL) 10 MG tablet Take 10 mg by mouth daily.      Marland Kitchen loratadine (WAL-ITIN) 10 MG tablet Take 10 mg by mouth daily.    . metFORMIN (GLUCOPHAGE) 500 MG tablet Take 1 tablet (500 mg total) by mouth daily.    . metoprolol succinate (TOPROL XL) 25 MG 24 hr tablet Take 0.5 tablets (12.5 mg total) by mouth daily. 60 tablet 1  . Potassium Gluconate 550 MG TABS Take 550 mg by mouth daily.     . simvastatin  (ZOCOR) 40 MG tablet Take 40 mg by mouth at bedtime.       No current facility-administered medications on file prior to visit.     Allergies:   Allergies  Allergen Reactions  . Clindamycin/Lincomycin Rash  . Doxycycline Other (See Comments)    Makes heart race  . Keflex [Cephalexin] Nausea And Vomiting  . Adhesive [Tape] Rash  . Latex Rash  . Sulfonamide Derivatives Nausea And Vomiting    Physical Exam General: well developed, well nourished, seated, in no evident distress Head: head normocephalic and atraumatic.   Neck: supple with no carotid or supraclavicular bruits Cardiovascular: regular rate and rhythm, no murmurs Musculoskeletal: no deformity.Mild posterior neck tightness and limitation of extension Skin:  no rash/petichiae Vascular:  Normal pulses all extremities  Neurologic Exam Mental Status: Awake and fully alert. Oriented to place and time. Recent and remote memory intact. Attention span, concentration and fund of knowledge appropriate. Mood and affect appropriate.  Cranial Nerves: Fundoscopic exam reveals sharp disc margins. Pupils equal, briskly reactive to light. Extraocular movements full without nystagmus. Visual fields full to confrontation. Hearing intact. Facial sensation intact. Face, tongue, palate moves normally and symmetrically.  Motor: Normal bulk and tone. Normal strength in all tested extremity muscles. Sensory.: intact to touch , pinprick , position and vibratory sensation.  Coordination: Rapid alternating movements normal in all extremities. Finger-to-nose and heel-to-shin performed accurately bilaterally. Gait and Station: Arises from chair without difficulty. Stance is normal. Gait demonstrates normal stride length and balance . Able to heel, toe and tandem walk with some difficulty.  Reflexes: 1+ and symmetric. Toes downgoing.      ASSESSMENT: 16 year Caucasian lady with subjective feeling of head floating and dizziness of unclear etiology.  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    PLAN: I had a long discussion with the patient with regards to his symptoms of intermittent wooziness following visit to a beauty parlor possibly related to vertebrobasilar insufficiency versus cervical arthritis. I asked her to avoid doing neck extension activities. Check MRI scan of the brain and cervical spine as well as CT angiogram of the brain and neck. I have given her neck stretching exercises to help with him posterior neck pain. Greater than 50% time during this 45 minute consultation notes was spent on counseling and coordination of care she will dizziness and neck pain Return for follow-up in 2 months or call earlier if necessary. Antony Contras, MD Medical Director [facility] Pager: [phone] 02/16/2016 9:16 AM  Note: This document was prepared with digital dictation and possible smart phrase technology. Any transcriptional errors that result from this process are unintentional.

## 2016-02-21 ENCOUNTER — Emergency Department (HOSPITAL_COMMUNITY): Payer: PPO

## 2016-02-21 ENCOUNTER — Encounter (HOSPITAL_COMMUNITY): Payer: Self-pay | Admitting: *Deleted

## 2016-02-21 ENCOUNTER — Emergency Department (HOSPITAL_COMMUNITY)
Admission: EM | Admit: 2016-02-21 | Discharge: 2016-02-21 | Disposition: A | Discharge status: Home / Self Care | Payer: PPO | Attending: Emergency Medicine | Admitting: Emergency Medicine

## 2016-02-21 DIAGNOSIS — R05 Cough: Secondary | ICD-10-CM | POA: Insufficient documentation

## 2016-02-21 DIAGNOSIS — I10 Essential (primary) hypertension: Secondary | ICD-10-CM | POA: Diagnosis not present

## 2016-02-21 DIAGNOSIS — Z87891 Personal history of nicotine dependence: Secondary | ICD-10-CM | POA: Insufficient documentation

## 2016-02-21 DIAGNOSIS — Z91040 Latex allergy status: Secondary | ICD-10-CM | POA: Insufficient documentation

## 2016-02-21 DIAGNOSIS — Z7984 Long term (current) use of oral hypoglycemic drugs: Secondary | ICD-10-CM | POA: Diagnosis not present

## 2016-02-21 DIAGNOSIS — E119 Type 2 diabetes mellitus without complications: Secondary | ICD-10-CM | POA: Diagnosis not present

## 2016-02-21 DIAGNOSIS — R42 Dizziness and giddiness: Secondary | ICD-10-CM | POA: Insufficient documentation

## 2016-02-21 DIAGNOSIS — Z79899 Other long term (current) drug therapy: Secondary | ICD-10-CM | POA: Insufficient documentation

## 2016-02-21 DIAGNOSIS — R531 Weakness: Secondary | ICD-10-CM | POA: Diagnosis not present

## 2016-02-21 LAB — URINALYSIS, ROUTINE W REFLEX MICROSCOPIC
BILIRUBIN URINE: NEGATIVE
GLUCOSE, UA: NEGATIVE mg/dL
HGB URINE DIPSTICK: NEGATIVE
KETONES UR: NEGATIVE mg/dL
Leukocytes, UA: NEGATIVE
Nitrite: NEGATIVE
PH: 7 (ref 5.0–8.0)
Protein, ur: NEGATIVE mg/dL
SPECIFIC GRAVITY, URINE: 1.009 (ref 1.005–1.030)

## 2016-02-21 LAB — CBC
HEMATOCRIT: 41.9 % (ref 36.0–46.0)
HEMOGLOBIN: 14.8 g/dL (ref 12.0–15.0)
MCH: 29.2 pg (ref 26.0–34.0)
MCHC: 35.3 g/dL (ref 30.0–36.0)
MCV: 82.8 fL (ref 78.0–100.0)
Platelets: 188 10*3/uL (ref 150–400)
RBC: 5.06 MIL/uL (ref 3.87–5.11)
RDW: 12.3 % (ref 11.5–15.5)
WBC: 6.4 10*3/uL (ref 4.0–10.5)

## 2016-02-21 LAB — BASIC METABOLIC PANEL
ANION GAP: 10 (ref 5–15)
BUN: 10 mg/dL (ref 6–20)
CO2: 26 mmol/L (ref 22–32)
Calcium: 10.1 mg/dL (ref 8.9–10.3)
Chloride: 95 mmol/L — ABNORMAL LOW (ref 101–111)
Creatinine, Ser: 0.68 mg/dL (ref 0.44–1.00)
GFR calc Af Amer: >60 mL/min (ref >60)
GLUCOSE: 167 mg/dL — AB (ref 65–99)
POTASSIUM: 3.4 mmol/L — AB (ref 3.5–5.1)
Sodium: 131 mmol/L — ABNORMAL LOW (ref 135–145)

## 2016-02-21 LAB — TROPONIN I

## 2016-02-21 LAB — CBG MONITORING, ED: Glucose-Capillary: 175 mg/dL — ABNORMAL HIGH (ref 65–99)

## 2016-02-21 MED ORDER — SODIUM CHLORIDE 0.9 % IV BOLUS (SEPSIS)
500.0000 mL | Freq: Once | INTRAVENOUS | Status: AC
  Administered 2016-02-21: 500 mL via INTRAVENOUS

## 2016-02-21 NOTE — ED Notes (Signed)
Pt is aware of need for urine specimen.Pt stated she will try to void after fluids are finished

## 2016-02-21 NOTE — ED Notes (Signed)
CBG 175. 

## 2016-02-21 NOTE — ED Provider Notes (Signed)
[facility] DEPT Provider Note   CSN: [id number] Arrival date & time: 02/21/16  0945     History   Chief Complaint Chief Complaint  Patient presents with  . Weakness    HPI Erika Lee is a 70 y.o. female.  HPI Patient presents with episode of lightheadedness. States she was upstairs visiting her husband who is admitted to the hospital. States she began to feel little tingling on the inside. States she felt lightheaded. Had another episode of it this morning. No fevers or chills. No chest pain. Did not feel her heart racing. No dysuria. No headache. States she just feels Bad. She's been on amoxicillin for a URI by her primary care doctor. Her cough is still present but improved. Previous history of atrial flutter and had been ablated by Dr. Rayann Heman. She is on a pixel band and metoprolol. Base rate for her heart rate is normally around 50-60.   Past Medical History:  Diagnosis Date  . Arthritis   . Atrial flutter ([place])    s/p CTI by [name] 07/16/2013  . Diabetes mellitus    Borderline  . Essential hypertension, benign   . GERD (gastroesophageal reflux disease)   . Hemorrhoids   . Hyperlipidemia   . Multinodular goiter   . Osteoarthritis   . Palpitations   . Paroxysmal atrial fibrillation (HCC)    s/p PVI by [name] 07/16/2013    Patient Active Problem List   Diagnosis Date Noted  . Shortness of breath 07/25/2014  . Influenza due to identified novel influenza A virus with other respiratory manifestations 08/03/2013  . Generalized weakness 07/29/2013  . Cough 07/29/2013  . Hyponatremia 07/28/2013  . Atrial fibrillation ([place]) 07/16/2013  . Atrial flutter ([place]) 07/07/2013  . Sinus bradycardia 06/11/2013  . Tachycardia-bradycardia ([place]) 06/11/2013  . Type 2 diabetes mellitus ([place]) 05/13/2013  . Paroxysmal atrial fibrillation ([place]) 09/03/2010  . Essential hypertension, benign 03/10/2007  . GERD 03/10/2007    Past Surgical History:  Procedure Laterality Date    . ATRIAL FIBRILLATION ABLATION N/A 07/16/2013   PVI and CTI ablation by [name]  . BIOPSY THYROID    . COLONOSCOPY  09/10/2011   Procedure: COLONOSCOPY;  Surgeon: Jamesetta So, MD;  [facility];  Service: Gastroenterology;  Laterality: N/A;  . LESION REMOVAL N/A 05/03/2013   Procedure: EXCISION CYST, BACK;  Surgeon: Jamesetta So, MD;  Location: [facility] ORS;  Service: General;  Laterality: N/A;  . LOOP RECORDER IMPLANT N/A 07/26/2014   Procedure: LOOP RECORDER IMPLANT;  Surgeon: Thompson Grayer, MD;  Location: [facility] CATH LAB;  Service: Cardiovascular;  Laterality: N/A;  . TEE WITHOUT CARDIOVERSION N/A 07/15/2013   Procedure: TRANSESOPHAGEAL ECHOCARDIOGRAM (TEE);  Surgeon: Lelon Perla, MD;  Location: [facility] ENDOSCOPY;  Service: Cardiovascular;  Laterality: N/A;  . TONSILLECTOMY    . TRANSANAL HEMORRHOIDAL DEARTERIALIZATION N/A 09/22/2015   Procedure: TRANSANAL HEMORRHOIDAL LIGATION/PEXY EUA POSSIBLE HEMORRHOIDECTOMY ;  Surgeon: Michael Boston, MD;  Location: [facility] ORS;  Service: General;  Laterality: N/A;    OB History    No data available       Home Medications    Prior to Admission medications   Medication Sig Start Date End Date Taking? Authorizing Provider  amoxicillin (AMOXIL) 500 MG capsule Take 500 mg by mouth 3 (three) times daily. Started 02/14/2016 02/14/16  Yes Historical Provider, MD  apixaban (ELIQUIS) 5 MG TABS tablet Take 1 tablet (5 mg total) by mouth 2 (two) times daily. 07/25/14  Yes Jeneen Rinks  Allred, MD  esomeprazole (NEXIUM) 20 MG capsule Take 20 mg by mouth daily.    Yes Historical Provider, MD  hydrochlorothiazide 25 MG tablet Take 25 mg by mouth daily.     Yes Historical Provider, MD  HYDROcodone-acetaminophen (NORCO) 7.5-325 MG tablet Take 1-2 tablets by mouth every 4 (four) hours as needed for moderate pain or severe pain. 09/22/15  Yes Michael Boston, MD  HYDROcodone-homatropine [facility]) 5-1.5 MG/5ML syrup Take 5 mLs by mouth 4 (four) times daily.   Yes Historical  Provider, MD  lisinopril (PRINIVIL,ZESTRIL) 10 MG tablet Take 10 mg by mouth daily.     Yes Historical Provider, MD  loratadine (WAL-ITIN) 10 MG tablet Take 10 mg by mouth daily.   Yes Historical Provider, MD  metFORMIN (GLUCOPHAGE) 500 MG tablet Take 1 tablet (500 mg total) by mouth daily. 07/17/13  Yes Brett Canales, PA-C  metoprolol succinate (TOPROL XL) 25 MG 24 hr tablet Take 0.5 tablets (12.5 mg total) by mouth daily. 09/06/15  Yes Satira Sark, MD  Potassium Gluconate 550 MG TABS Take 550 mg by mouth daily.    Yes Historical Provider, MD  simvastatin (ZOCOR) 40 MG tablet Take 40 mg by mouth at bedtime.     Yes Historical Provider, MD    Family History Family History  Problem Relation Age of Onset  . Stroke Mother 82  . Stroke Maternal Grandmother     stroke  . Other Maternal Grandfather     "heart dropsy" kidney issues  . Kidney disease Maternal Grandfather     Social History Social History  Substance Use Topics  . Smoking status: Former Smoker    Types: Cigarettes    Quit date: 01/02/1994  . Smokeless tobacco: Never Used  . Alcohol use No     Allergies   Clindamycin/lincomycin; Doxycycline; Keflex [cephalexin]; Adhesive [tape]; Latex; and Sulfonamide derivatives   Review of Systems Review of Systems  Constitutional: Negative for activity change and appetite change.  Eyes: Negative for pain.  Respiratory: Positive for cough. Negative for chest tightness and shortness of breath.   Cardiovascular: Negative for chest pain and leg swelling.  Gastrointestinal: Negative for abdominal pain, diarrhea, nausea and vomiting.  Genitourinary: Negative for flank pain.  Musculoskeletal: Negative for back pain and neck stiffness.  Skin: Negative for rash.  Neurological: Positive for light-headedness. Negative for weakness, numbness and headaches.  Psychiatric/Behavioral: Negative for behavioral problems.  All other systems reviewed and are negative.    Physical Exam Updated  Vital Signs BP 176/83 (BP Location: Left Arm)   Pulse 60   Temp 98.7 F (37.1 C) (Oral)   Resp 18   Ht 5\' 5"  (1.651 m)   Wt 163 lb (73.9 kg)   SpO2 94%   BMI 27.12 kg/m   Physical Exam  Constitutional: She appears well-developed.  HENT:  Head: Atraumatic.  Neck: Neck supple.  Cardiovascular:  No murmur heard. Mild bradycardia  Pulmonary/Chest: Effort normal.  Abdominal: Soft. She exhibits no distension.  Musculoskeletal: She exhibits no edema.  Neurological: She is alert.  Skin: Skin is warm.  Psychiatric: She has a normal mood and affect.     ED Treatments / Results  Labs (all labs ordered are listed, but only abnormal results are displayed) Labs Reviewed  BASIC METABOLIC PANEL - Abnormal; Notable for the following:       Result Value   Sodium 131 (*)    Potassium 3.4 (*)    Chloride 95 (*)    Glucose, Bld  167 (*)    All other components within normal limits  CBG MONITORING, ED - Abnormal; Notable for the following:    Glucose-Capillary 175 (*)    All other components within normal limits  CBC  URINALYSIS, ROUTINE W REFLEX MICROSCOPIC (NOT AT [facility])  TROPONIN I    EKG  EKG Interpretation  Date/Time:  Wednesday February 21 2016 10:11:20 EDT Ventricular Rate:  59 PR Interval:    QRS Duration: 105 QT Interval:  456 QTC Calculation: 452 R Axis:   75 Text Interpretation:  Sinus rhythm Ventricular premature complex Probable lateral infarct, old Baseline wander in lead(s) V1 Confirmed by Alvino Chapel  MD,  [phone]) on 02/21/2016 11:06:39 AM       Radiology Dg Chest 2 View  Result Date: 02/21/2016 CLINICAL DATA:  Dizziness and weakness EXAM: CHEST  2 VIEW COMPARISON:  August 16, 2014 FINDINGS: There is no edema or consolidation. Heart size and pulmonary vascularity are normal. There is calcification in the aortic arch region. There is a loop recorder anteriorly on the left. No adenopathy. There is mild degenerative change in the thoracic spine. IMPRESSION:  No edema or consolidation. Aortic atherosclerosis. Loop recorder on left. Electronically Signed   By: Lowella Grip III M.D.   On: 02/21/2016 10:34    Procedures Procedures (including critical care time)  Medications Ordered in ED Medications  sodium chloride 0.9 % bolus 500 mL (0 mLs Intravenous Stopped 02/21/16 1152)     Initial Impression / Assessment and Plan / ED Course  I have reviewed the triage vital signs and the nursing notes.  Pertinent labs & imaging results that were available during my care of the patient were reviewed by me and considered in my medical decision making (see chart for details).  Clinical Course    Patient with generalized weakness. Labs reassuring but does have mild hyponatremia and hypokalemia. Does better after IV fluids. Reassuring EKG. Discharge home.  Final Clinical Impressions(s) / ED Diagnoses   Final diagnoses:  Weakness    New Prescriptions New Prescriptions   No medications on file     Davonna Belling, MD 02/21/16 1258

## 2016-02-21 NOTE — ED Notes (Signed)
Patient transported to X-ray 

## 2016-02-21 NOTE — ED Triage Notes (Addendum)
Pt reports seeing her PCP last week for sinus infection and was put on amoxicillin.  States today while visiting her husband in the hospital, she became lightheaded and weak.  Pt denies any SOB or cp at this time.  States she also feels "jittery inside."   Has productive cough with "yellowish" sputum.

## 2016-02-22 DIAGNOSIS — R05 Cough: Secondary | ICD-10-CM | POA: Diagnosis not present

## 2016-02-27 ENCOUNTER — Ambulatory Visit (INDEPENDENT_AMBULATORY_CARE_PROVIDER_SITE_OTHER): Payer: PPO | Admitting: *Deleted

## 2016-02-27 DIAGNOSIS — I480 Paroxysmal atrial fibrillation: Secondary | ICD-10-CM

## 2016-02-28 NOTE — Progress Notes (Signed)
Carelink Summary Report / Loop Recorder 

## 2016-03-05 ENCOUNTER — Other Ambulatory Visit: Payer: Self-pay | Admitting: Neurology

## 2016-03-08 ENCOUNTER — Ambulatory Visit
Admission: RE | Admit: 2016-03-08 | Discharge: 2016-03-08 | Disposition: A | Discharge status: Home / Self Care | Payer: PPO | Source: Ambulatory Visit | Attending: Neurology | Admitting: Neurology

## 2016-03-08 DIAGNOSIS — M542 Cervicalgia: Secondary | ICD-10-CM

## 2016-03-08 DIAGNOSIS — G450 Vertebro-basilar artery syndrome: Secondary | ICD-10-CM

## 2016-03-08 DIAGNOSIS — R42 Dizziness and giddiness: Secondary | ICD-10-CM | POA: Diagnosis not present

## 2016-03-08 MED ORDER — IOPAMIDOL (ISOVUE-370) INJECTION 76%
80.0000 mL | Freq: Once | INTRAVENOUS | Status: AC | PRN
  Administered 2016-03-08: 80 mL via INTRAVENOUS

## 2016-03-11 ENCOUNTER — Telehealth: Payer: Self-pay

## 2016-03-11 ENCOUNTER — Other Ambulatory Visit: Payer: Self-pay | Admitting: Neurology

## 2016-03-11 DIAGNOSIS — E040 Nontoxic diffuse goiter: Secondary | ICD-10-CM

## 2016-03-11 NOTE — Telephone Encounter (Signed)
Patient is returning your call. She can be reached at [phone].

## 2016-03-11 NOTE — Telephone Encounter (Signed)
RN receive call from [place] at [place]. Erika Lee stated the Ct angio head results are up. She wanted to know if Erika Lee has access to epic. Rn stated he does. Rn stated a message will be sent to Erika Lee about the results and to review them. Erika Lee verbalized understanding.

## 2016-03-11 NOTE — Telephone Encounter (Signed)
I spoke with the patient and gave him results of her MRI scan of the brain showing only mild changes of hardening of the arteries an MRI scan of cervical spine showing mild arthritic changes but without definite compression not requiring any surgical referral at this time. 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 The patient does not have significant risk factors except for hypertension which appears to be well controlled. I recommend conservative follow-up at the present time with repeat CT angiogram in a year. She was advised to get help if she had a severe headache. We also discussed CT angiogram of the neck suggesting multinodular thyroid goiter which the  patient was not aware of. I will order thyroid ultrasound and I advised her to follow-up with her primary physician Dr. Luan Pulling in [place] for further treatment for thyroid problem. She voiced understanding.

## 2016-03-11 NOTE — Telephone Encounter (Signed)
I called and left message for patient to call me back to discuss results of Ct angio and MRIs

## 2016-03-18 ENCOUNTER — Telehealth: Payer: Self-pay | Admitting: Neurology

## 2016-03-18 NOTE — Telephone Encounter (Signed)
Message sent to Dr. Leonie Man. Dr .Leonie Man spoke with patient on 03/11/2016. See phone note.

## 2016-03-18 NOTE — Telephone Encounter (Signed)
Pt called said she did not understand all that Dr Leonie Man told her about the MRI. Please call her at [phone]

## 2016-03-19 NOTE — Telephone Encounter (Signed)
Pt returned Katrina's call. Katrina said she would have to call her back

## 2016-03-19 NOTE — Telephone Encounter (Signed)
Left vm on patients number at [phone]. Dr. Leonie Man spoke with patient af great length on 03/11/2016 about her test results and recommendations. See phone note. Pt did not understand.

## 2016-03-19 NOTE — Telephone Encounter (Signed)
Pt called stating she would like to speak with the nurse about what Dr. Leonie Man called her about last week. She does not understand what was said. Please call and advise

## 2016-03-19 NOTE — Telephone Encounter (Signed)
Erika Fila, MD  Neurology    [] Hide copied text [] Hover for attribution information I spoke with the patient and gave him results of her MRI scan of the brain showing only mild changes of hardening of the arteries an MRI scan of cervical spine showing mild arthritic changes but without definite compression not requiring any surgical referral at this time. 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 The patient does not have significant risk factors except for hypertension which appears to be well controlled. I recommend conservative follow-up at the present time with repeat CT angiogram in a year. She was advised to get help if she had a severe headache. We also discussed CT angiogram of the neck suggesting multinodular thyroid goiter which the  patient was not aware of. I will order thyroid ultrasound and I advised her to follow-up with her primary physician Dr. Luan Pulling in [place] for further treatment for thyroid problem. She voiced understanding.    Electronically signed by Erika Fila, MD at 03/11/2016 11:08 AM    Rn call patient today on 03/19/2016 and gave her Dr. Leonie Man message from 03/11/2016.Pt stated Dr.Sethi did not tell her the size of the aneurysm or anything about the thyroid. Rn stated per his telephone note he gave her a detail result. Rn gave patient the same message Dr. Leonie Man stated above. PT stated no one call her about the thyroid ultrasound. Rn stated it was sent to [facility] radiology. Rn stated to pt to call [name] about scheduling the ultrasound. Pt verbalized understanding about calling to schedule the thyroid ultrasound, and following up with her PCP. Pt was clear on the MRI, Ct angiogram,and MR brain and spine.Pt also verbalized understanding that in a year a repeat Ct angiogram will be done. Also pt verbalized understanding that if she has a severe headache to call 911  and seek nearest hospital.

## 2016-03-23 LAB — CUP PACEART REMOTE DEVICE CHECK: MDC IDC SESS DTM: 20170829230821

## 2016-03-23 NOTE — Progress Notes (Signed)
Carelink summary report received. Battery status OK. Normal device function. No new symptom episodes, tachy episodes, brady, or pause episodes. No new AF episodes. Monthly summary reports and ROV/PRN 

## 2016-03-28 ENCOUNTER — Ambulatory Visit (INDEPENDENT_AMBULATORY_CARE_PROVIDER_SITE_OTHER): Payer: PPO | Admitting: *Deleted

## 2016-03-28 DIAGNOSIS — I480 Paroxysmal atrial fibrillation: Secondary | ICD-10-CM

## 2016-03-29 NOTE — Progress Notes (Signed)
Carelink Summary Report / Loop Recorder 

## 2016-04-02 DIAGNOSIS — Z23 Encounter for immunization: Secondary | ICD-10-CM | POA: Diagnosis not present

## 2016-04-02 DIAGNOSIS — K219 Gastro-esophageal reflux disease without esophagitis: Secondary | ICD-10-CM | POA: Diagnosis not present

## 2016-04-02 DIAGNOSIS — I4891 Unspecified atrial fibrillation: Secondary | ICD-10-CM | POA: Diagnosis not present

## 2016-04-02 DIAGNOSIS — E119 Type 2 diabetes mellitus without complications: Secondary | ICD-10-CM | POA: Diagnosis not present

## 2016-04-02 DIAGNOSIS — I10 Essential (primary) hypertension: Secondary | ICD-10-CM | POA: Diagnosis not present

## 2016-04-03 ENCOUNTER — Other Ambulatory Visit (HOSPITAL_COMMUNITY): Payer: Self-pay | Admitting: Pulmonary Disease

## 2016-04-03 DIAGNOSIS — E049 Nontoxic goiter, unspecified: Secondary | ICD-10-CM

## 2016-04-05 DIAGNOSIS — I4891 Unspecified atrial fibrillation: Secondary | ICD-10-CM | POA: Diagnosis not present

## 2016-04-05 DIAGNOSIS — I10 Essential (primary) hypertension: Secondary | ICD-10-CM | POA: Diagnosis not present

## 2016-04-05 DIAGNOSIS — E119 Type 2 diabetes mellitus without complications: Secondary | ICD-10-CM | POA: Diagnosis not present

## 2016-04-05 DIAGNOSIS — K219 Gastro-esophageal reflux disease without esophagitis: Secondary | ICD-10-CM | POA: Diagnosis not present

## 2016-04-10 ENCOUNTER — Ambulatory Visit (HOSPITAL_COMMUNITY)
Admission: RE | Admit: 2016-04-10 | Discharge: 2016-04-10 | Disposition: A | Discharge status: Home / Self Care | Payer: PPO | Source: Ambulatory Visit | Attending: Pulmonary Disease | Admitting: Pulmonary Disease

## 2016-04-10 DIAGNOSIS — E049 Nontoxic goiter, unspecified: Secondary | ICD-10-CM

## 2016-04-10 DIAGNOSIS — E042 Nontoxic multinodular goiter: Secondary | ICD-10-CM | POA: Diagnosis not present

## 2016-04-25 ENCOUNTER — Other Ambulatory Visit (HOSPITAL_COMMUNITY): Payer: Self-pay | Admitting: Pulmonary Disease

## 2016-04-25 DIAGNOSIS — E041 Nontoxic single thyroid nodule: Secondary | ICD-10-CM

## 2016-04-26 ENCOUNTER — Ambulatory Visit: Payer: PPO | Admitting: Neurology

## 2016-04-29 ENCOUNTER — Ambulatory Visit (INDEPENDENT_AMBULATORY_CARE_PROVIDER_SITE_OTHER): Payer: PPO | Admitting: *Deleted

## 2016-04-29 DIAGNOSIS — I480 Paroxysmal atrial fibrillation: Secondary | ICD-10-CM | POA: Diagnosis not present

## 2016-04-29 NOTE — Progress Notes (Signed)
Carelink Summary Report / Loop Recorder 

## 2016-04-30 ENCOUNTER — Other Ambulatory Visit: Payer: Self-pay | Admitting: *Deleted

## 2016-04-30 MED ORDER — METOPROLOL SUCCINATE ER 25 MG PO TB24: 12.5000 mg | ORAL_TABLET | Freq: Every day | ORAL | 3 refills | Status: DC

## 2016-05-02 ENCOUNTER — Ambulatory Visit (HOSPITAL_COMMUNITY)
Admission: RE | Admit: 2016-05-02 | Discharge: 2016-05-02 | Disposition: A | Discharge status: Home / Self Care | Payer: PPO | Source: Ambulatory Visit | Attending: Pulmonary Disease | Admitting: Pulmonary Disease

## 2016-05-02 ENCOUNTER — Telehealth: Payer: Self-pay | Admitting: Internal Medicine

## 2016-05-02 DIAGNOSIS — E041 Nontoxic single thyroid nodule: Secondary | ICD-10-CM

## 2016-05-02 MED ORDER — LIDOCAINE HCL (PF) 2 % IJ SOLN
INTRAMUSCULAR | Status: AC
  Filled 2016-05-02: qty 10

## 2016-05-02 NOTE — Telephone Encounter (Signed)
New message   Pt is having thyroid biopsy and needs clearance to stop taking eliquis 5mg /2xdaily, 2 days prior to surgery  Please call pt back to confirm

## 2016-05-02 NOTE — Telephone Encounter (Signed)
Patient takes Eliquis for afib with CHADS2 score of 2 (HTN and DM). Ok to hold Eliquis for 2 days prior to biopsy. Procedure recommend by PCP Dr Sinda Du, will be performed Monday 11/6 by Dr Zigmund Daniel at [facility] - radiology dept. Pt is aware of plan.

## 2016-05-06 ENCOUNTER — Ambulatory Visit (HOSPITAL_COMMUNITY)
Admission: RE | Admit: 2016-05-06 | Discharge: 2016-05-06 | Disposition: A | Discharge status: Home / Self Care | Payer: PPO | Source: Ambulatory Visit | Attending: Pulmonary Disease | Admitting: Pulmonary Disease

## 2016-05-06 ENCOUNTER — Other Ambulatory Visit (HOSPITAL_COMMUNITY): Payer: Self-pay | Admitting: Pulmonary Disease

## 2016-05-06 DIAGNOSIS — E041 Nontoxic single thyroid nodule: Secondary | ICD-10-CM | POA: Diagnosis not present

## 2016-05-06 MED ORDER — LIDOCAINE HCL (PF) 2 % IJ SOLN
INTRAMUSCULAR | Status: AC
  Filled 2016-05-06: qty 10

## 2016-05-06 NOTE — Discharge Instructions (Signed)
Thyroid Biopsy °The thyroid gland is a butterfly-shaped gland located in the front of the neck. It produces hormones that affect metabolism, growth and development, and body temperature. Thyroid biopsy is a procedure in which small samples of tissue or fluid are removed from the thyroid gland. The samples are then looked at under a microscope to check for abnormalities. This procedure is done to determine the cause of thyroid problems. It may be done to check for infection, cancer, or other thyroid problems. °Two methods may be used for a thyroid biopsy. In one method, a thin needle is inserted through the skin and into the thyroid gland. In the other method, an open incision is made through the skin. °LET YOUR HEALTH CARE PROVIDER KNOW ABOUT:  °· Any allergies you have. °· All medicines you are taking, including vitamins, herbs, eye drops, creams, and over-the-counter medicines. °· Previous problems you or members of your family have had with the use of anesthetics. °· Any blood disorders you have. °· Previous surgeries you have had. °· Medical conditions you have. °RISKS AND COMPLICATIONS °Generally, this is a safe procedure. However, problems can occur and include: °· Bleeding from the procedure site. °· Infection. °· Injury to structures near the thyroid gland. °BEFORE THE PROCEDURE  °· Ask your health care provider about: °¨ Changing or stopping your regular medicines. This is especially important if you are taking diabetes medicines or blood thinners. °¨ Taking medicines such as aspirin and ibuprofen. These medicines can thin your blood. Do not take these medicines before your procedure if your health care provider asks you not to. °· Do not eat or drink anything after midnight on the night before the procedure or as directed by your health care provider. °· You may have a blood sample taken. °PROCEDURE °Either of these methods may be used to perform a thyroid biopsy: °· Fine needle biopsy. You may be given  medicine to help you relax (sedative). You will be asked to lie on your back with your head tipped backward to extend your neck. An area on your neck will be cleaned. A needle will then be inserted through the skin of your neck. You may be asked to avoid coughing, talking, swallowing, or making sounds during some portions of the procedure. The needle will be withdrawn once the tissue or fluid samples have been removed. Pressure may be applied to your neck to reduce swelling and ensure that bleeding has stopped. The samples will be sent to a lab for examination. °· Open biopsy. You will be given medicine to make you sleep (general anesthetic). An incision will be made in your neck. A sample of thyroid tissue will be removed using surgical tools. The tissue sample will be sent for examination. In some cases, the sample may be examined during the biopsy. If that is done and cancer cells are found, some or all of the thyroid gland may be removed. The incision will be closed with stitches. °AFTER THE PROCEDURE  °· Your recovery will be assessed and monitored. °· You may have soreness and tenderness at the site of the biopsy. This should go away after a few days. °· If you had an open biopsy, you may have a hoarse voice or sore throat for a couple days. °· It is your responsibility to get your test results. °  °This information is not intended to replace advice given to you by your health care provider. Make sure you discuss any questions you have with your health   care provider. °  °Document Released: 04/14/2007 Document Revised: 07/08/2014 Document Reviewed: 09/09/2013 °Elsevier Interactive Patient Education ©2016 Elsevier Inc. ° °

## 2016-05-08 ENCOUNTER — Encounter: Payer: Self-pay | Admitting: Neurology

## 2016-05-08 ENCOUNTER — Ambulatory Visit (INDEPENDENT_AMBULATORY_CARE_PROVIDER_SITE_OTHER): Payer: PPO | Admitting: Neurology

## 2016-05-08 VITALS — BP 147/68 | HR 49 | Ht 65.0 in | Wt 171.6 lb

## 2016-05-08 DIAGNOSIS — R42 Dizziness and giddiness: Secondary | ICD-10-CM | POA: Diagnosis not present

## 2016-05-08 DIAGNOSIS — I671 Cerebral aneurysm, nonruptured: Secondary | ICD-10-CM

## 2016-05-08 NOTE — Progress Notes (Signed)
[facility] [address] [place]. [place] [address] [phone]       OFFICE FOLLOW UP VISIT NOTE  [name] Date of Birth:  June 15, 1946 Medical Record Number:  [id number]   Referring MD:  Sinda Du  Reason for Referral:  Dizziness and neck pain  HPI: Initial Consult 02/15/16 : Erika Lee is a 70 year old pleasant Caucasian lady with history of hypertension, diabetes, hyperlipidemia, atrial fibrillation, gastroesophageal reflux disease who the last 3 weeks describes a sensation of "" head floating this may occur at any time and she feels funny. She states this is not true dizziness or vertigo. This may, come on suddenly without warning. She feels the symptoms began after she had been to a beauty parlor and spent her neck in extension position for a while. She has also been to a chiropractor but this feeling has not gone away. She does have chronic posterior neck pain and has known arthritis in the neck. She however denies any shooting pain down her spine or into her shoulder or arms. She denies tingling numbness or weakness in her hands. She has no trouble with walking balance or any falls. She did have MRI scan of cervical spine done in 2011 which I personally reviewed the images shows moderate degenerative changes with spinal stenosis at  C3-4, C 4-5 and C 5-6. She also has some intermittent midthoracic curve pain as well. She denies any recent motor vehicle accident, neck injury or jerking. 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 Patient did undergo CT scan of the head the on 7//10 17  films of which I personally reviewed show no acute abnormality. She has no prior history of strokes, TIA, seizures significant neurological problems. She does complain of some dizziness and lightheadedness when she raises her arm above her shoulders or looks up but this feeling is only transient. Update 05/08/2016 ; she returns for follow-up after last visit 3 months ago. He  recommended by husband. She stated subjective improvement in her sensation of head floating and dizziness which is not as bad. She notices this mainly when she moves the her head suddenly. The patient had MRI scan the brain done which I personally reviewed showed only mild changes of cerebral atrophy and small vessel disease. MRI scan cervical spine showed mild disc degenerative changes most prominent at C5-6 and C6-7 with mild foraminal narrowing but without significant compression. CT angiogram of the brain and neck revealed no significant Endocrine stenosis. 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 Incidental thyroid mass was also noted. Subsequently patient had outpatient thyroid ultrasound done which confirmed a cystic goiter for which she underwent ultrasound-guided biopsy 2 days ago and final results are yet elevated. Patient states her mother had brain aneurysm and required surgery and she is interested in getting consultation to discuss aneurysm treatments ROS:   14 system review of systems is positive for  neck pain, floating sensation, dizziness, aching muscles  and all other er the systems negative  PMH:  Past Medical History:  Diagnosis Date  . Arthritis   . Atrial flutter ([place])    s/p CTI by [name] 07/16/2013  . Diabetes mellitus    Borderline  . Essential hypertension, benign   . GERD (gastroesophageal reflux disease)   . Hemorrhoids   . Hyperlipidemia   . Multinodular goiter   . Osteoarthritis   . Palpitations   . Paroxysmal atrial fibrillation ([facility])    s/p PVI by [name] 07/16/2013  Social History:  Social History   Social History  . Marital status: Married    Spouse name: N/A  . Number of children: N/A  . Years of education: N/A   Occupational History  . Trucking Business Retired    Full time   Social History Main Topics  . Smoking status: Former Smoker    Types: Cigarettes    Quit date: 01/02/1994  . Smokeless tobacco: Never  Used  . Alcohol use No  . Drug use: No  . Sexual activity: Not on file   Other Topics Concern  . Not on file   Social History Narrative   Lives with spouse in [place]    Medications:   Current Outpatient Prescriptions on File Prior to Visit  Medication Sig Dispense Refill  . apixaban (ELIQUIS) 5 MG TABS tablet Take 1 tablet (5 mg total) by mouth 2 (two) times daily. 60 tablet 12  . esomeprazole (NEXIUM) 20 MG capsule Take 20 mg by mouth daily.     . hydrochlorothiazide 25 MG tablet Take 25 mg by mouth daily.      Marland Kitchen HYDROcodone-acetaminophen (NORCO) 7.5-325 MG tablet Take 1-2 tablets by mouth every 4 (four) hours as needed for moderate pain or severe pain. 50 tablet 0  . lisinopril (PRINIVIL,ZESTRIL) 10 MG tablet Take 10 mg by mouth daily.      Marland Kitchen loratadine (WAL-ITIN) 10 MG tablet Take 10 mg by mouth daily.    . metFORMIN (GLUCOPHAGE) 500 MG tablet Take 1 tablet (500 mg total) by mouth daily.    . metoprolol succinate (TOPROL XL) 25 MG 24 hr tablet Take 0.5 tablets (12.5 mg total) by mouth daily. 45 tablet 3  . Potassium Gluconate 550 MG TABS Take 550 mg by mouth daily.     . simvastatin (ZOCOR) 40 MG tablet Take 40 mg by mouth at bedtime.       No current facility-administered medications on file prior to visit.     Allergies:   Allergies  Allergen Reactions  . Clindamycin/Lincomycin Rash  . Doxycycline Other (See Comments)    Makes heart race  . Keflex [Cephalexin] Nausea And Vomiting  . Adhesive [Tape] Rash  . Latex Rash  . Sulfonamide Derivatives Nausea And Vomiting    Physical Exam General: well developed, well nourished, seated, in no evident distress Head: head normocephalic and atraumatic.   Neck: supple with no carotid or supraclavicular bruits Cardiovascular: regular rate and rhythm, no murmurs Musculoskeletal: no deformity.Mild posterior neck tightness and limitation of extension Skin:  no rash/petichiae Vascular:  Normal pulses all  extremities  Neurologic Exam Mental Status: Awake and fully alert. Oriented to place and time. Recent and remote memory intact. Attention span, concentration and fund of knowledge appropriate. Mood and affect appropriate.  Cranial Nerves: Fundoscopic exam reveals sharp disc margins. Pupils equal, briskly reactive to light. Extraocular movements full without nystagmus. Visual fields full to confrontation. Hearing intact. Facial sensation intact. Face, tongue, palate moves normally and symmetrically.  Motor: Normal bulk and tone. Normal strength in all tested extremity muscles. Sensory.: intact to touch , pinprick , position and vibratory sensation.  Coordination: Rapid alternating movements normal in all extremities. Finger-to-nose and heel-to-shin performed accurately bilaterally.Hallpike: Not done. Fukuda stepping test is positive with patient moving several steps off the base. Head shaking is negative Gait and Station: Arises from chair without difficulty. Stance is normal. Gait demonstrates normal stride length and balance . Able to heel, toe and tandem walk with some difficulty.  Reflexes: 1+  and symmetric. Toes downgoing.      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 . 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 I advised patient not to make sudden neck movements bending movements. I offered a referral to vestibular rehabilitation for dizziness exercises but the patient is declining this at the present time. We also discussed her asymptomatic cerebral aneurysm and risk for rupture  and treatment options. Have referred her to Dr. [name], for discussion of treatment options given her family history of aneurysms. She will remain on eliquis for stroke prevention and maintain strict control of hypertension with blood pressure goal below 130/901 diabetes with hemoglobin A1c goal below 6.5 and lipids with LDL goal below 70 mg percent. She will return for follow-up in the future only as necessary. And no routine follow-up appointment was made Greater than 50% time during this 40 minute visit was spent on counseling and coordination of care about her dizziness and cerebral aneurysm and thyroid cyst   Antony Contras, [facility] Pager: [phone] 05/08/2016 1:16 PM  Note: This document was prepared with digital dictation and possible smart phrase technology. Any transcriptional errors that result from this process are unintentional.

## 2016-05-10 LAB — CUP PACEART REMOTE DEVICE CHECK
Date Time Interrogation Session: 20170928234036
MDC IDC PG IMPLANT DT: 20160126

## 2016-05-10 NOTE — Progress Notes (Signed)
Carelink summary report received. Battery status OK. Normal device function. No new symptom episodes, tachy episodes, brady, or pause episodes. No new AF episodes. Monthly summary reports and ROV/PRN 

## 2016-05-13 DIAGNOSIS — I10 Essential (primary) hypertension: Secondary | ICD-10-CM | POA: Diagnosis not present

## 2016-05-13 DIAGNOSIS — Z6828 Body mass index (BMI) 28.0-28.9, adult: Secondary | ICD-10-CM | POA: Diagnosis not present

## 2016-05-13 DIAGNOSIS — I671 Cerebral aneurysm, nonruptured: Secondary | ICD-10-CM | POA: Diagnosis not present

## 2016-05-27 ENCOUNTER — Ambulatory Visit (INDEPENDENT_AMBULATORY_CARE_PROVIDER_SITE_OTHER): Payer: PPO | Admitting: *Deleted

## 2016-05-27 DIAGNOSIS — I495 Sick sinus syndrome: Secondary | ICD-10-CM | POA: Diagnosis not present

## 2016-05-28 NOTE — Progress Notes (Signed)
Carelink Summary Report / Loop Recorder 

## 2016-06-03 LAB — CUP PACEART REMOTE DEVICE CHECK
Date Time Interrogation Session: 20171028234602
MDC IDC PG IMPLANT DT: 20160126

## 2016-06-03 NOTE — Progress Notes (Signed)
Carelink summary report received. Battery status OK. Normal device function. No new symptom episodes, tachy episodes, brady, or pause episodes. No new AF episodes. Monthly summary reports and ROV/PRN 

## 2016-06-10 ENCOUNTER — Ambulatory Visit (INDEPENDENT_AMBULATORY_CARE_PROVIDER_SITE_OTHER): Payer: PPO | Admitting: Internal Medicine

## 2016-06-10 ENCOUNTER — Encounter: Payer: Self-pay | Admitting: Internal Medicine

## 2016-06-10 VITALS — BP 158/78 | HR 56 | Ht 65.0 in | Wt 173.6 lb

## 2016-06-10 DIAGNOSIS — I4891 Unspecified atrial fibrillation: Secondary | ICD-10-CM

## 2016-06-10 DIAGNOSIS — I10 Essential (primary) hypertension: Secondary | ICD-10-CM

## 2016-06-10 LAB — CUP PACEART INCLINIC DEVICE CHECK
Implantable Pulse Generator Implant Date: 20160126
MDC IDC SESS DTM: 20171211162704

## 2016-06-10 MED ORDER — LISINOPRIL 20 MG PO TABS: 20.0000 mg | ORAL_TABLET | Freq: Every day | ORAL | 11 refills | Status: DC

## 2016-06-10 NOTE — Patient Instructions (Addendum)
Medication Instructions:  Your physician has recommended you make the following change in your medication:  1) INCREASE Lisinopril 20 mg daily   Labwork: None Ordered   Testing/Procedures: None Ordered   Follow-Up: Your physician wants you to follow-up in: 6 months with Roderic Palau, NP and 12 months with Dr. Rayann Heman. You will receive a reminder letter in the mail two months in advance. If you don't receive a letter, please call our office to schedule the follow-up appointment.   Any Other Special Instructions Will Be Listed Below (If Applicable).     If you need a refill on your cardiac medications before your next appointment, please call your pharmacy.

## 2016-06-10 NOTE — Progress Notes (Signed)
Electrophysiology Office Note   Date:  06/10/2016   ID:  Erika Lee, DOB [birth year], MRN [id number]  PCP:  Alonza Bogus, MD   Primary Electrophysiologist:  Thompson Grayer, MD    Chief Complaint  Patient presents with  . Atrial Fibrillation     History of Present Illness: Erika Lee is a 70 y.o. female who presents today for electrophysiology evaluation.    She is doing well.  Her primary complaint remainss chronic lower backpain.  She has been found to have a small terminal L carotid artery aneurysm for which she has seen Dr [name].  They have currently decided watchful waiting as the strategy.  She is also followed by Dr Leonie Man.  Today, she denies symptoms of chest pain, orthopnea, PND, lower extremity edema, claudication, dizziness, presyncope, syncope, bleeding, or neurologic sequela. The patient is tolerating medications without difficulties and is otherwise without complaint today.    Past Medical History:  Diagnosis Date  . Arthritis   . Atrial flutter ([place])    s/p CTI by [name] 07/16/2013  . Diabetes mellitus    Borderline  . Essential hypertension, benign   . GERD (gastroesophageal reflux disease)   . Hemorrhoids   . Hyperlipidemia   . Multinodular goiter   . Osteoarthritis   . Palpitations   . Paroxysmal atrial fibrillation ([facility])    s/p PVI by [name] 07/16/2013   Past Surgical History:  Procedure Laterality Date  . ATRIAL FIBRILLATION ABLATION N/A 07/16/2013   PVI and CTI ablation by [name]  . BIOPSY THYROID    . COLONOSCOPY  09/10/2011   Procedure: COLONOSCOPY;  Surgeon: Jamesetta So, MD;  Location: [facility] ENDO SUITE;  Service: Gastroenterology;  Laterality: N/A;  . LESION REMOVAL N/A 05/03/2013   Procedure: EXCISION CYST, BACK;  Surgeon: Jamesetta So, MD;  Location: [facility] ORS;  Service: General;  Laterality: N/A;  . LOOP RECORDER IMPLANT N/A 07/26/2014   Procedure: LOOP RECORDER IMPLANT;  Surgeon: Thompson Grayer, MD;  Location: [facility] CATH LAB;   Service: Cardiovascular;  Laterality: N/A;  . TEE WITHOUT CARDIOVERSION N/A 07/15/2013   Procedure: TRANSESOPHAGEAL ECHOCARDIOGRAM (TEE);  Surgeon: Lelon Perla, MD;  Location: [facility] ENDOSCOPY;  Service: Cardiovascular;  Laterality: N/A;  . TONSILLECTOMY    . TRANSANAL HEMORRHOIDAL DEARTERIALIZATION N/A 09/22/2015   Procedure: TRANSANAL HEMORRHOIDAL LIGATION/PEXY EUA POSSIBLE HEMORRHOIDECTOMY ;  Surgeon: Michael Boston, MD;  Location: [facility] ORS;  Service: General;  Laterality: N/A;     Current Outpatient Prescriptions  Medication Sig Dispense Refill  . apixaban (ELIQUIS) 5 MG TABS tablet Take 1 tablet (5 mg total) by mouth 2 (two) times daily. 60 tablet 12  . esomeprazole (NEXIUM) 20 MG capsule Take 20 mg by mouth daily.     . hydrochlorothiazide 25 MG tablet Take 25 mg by mouth daily.      Marland Kitchen HYDROcodone-acetaminophen (NORCO) 7.5-325 MG tablet Take 1-2 tablets by mouth every 4 (four) hours as needed for moderate pain or severe pain. 50 tablet 0  . lisinopril (PRINIVIL,ZESTRIL) 10 MG tablet Take 10 mg by mouth daily.      Marland Kitchen loratadine (WAL-ITIN) 10 MG tablet Take 10 mg by mouth daily.    . metFORMIN (GLUCOPHAGE) 500 MG tablet Take 1 tablet (500 mg total) by mouth daily.    . metoprolol succinate (TOPROL XL) 25 MG 24 hr tablet Take 0.5 tablets (12.5 mg total) by mouth daily. 45 tablet 3  . Potassium Gluconate 550 MG TABS Take 550 mg by  mouth daily.     . simvastatin (ZOCOR) 40 MG tablet Take 40 mg by mouth at bedtime.       No current facility-administered medications for this visit.     Allergies:   Clindamycin/lincomycin; Doxycycline; Keflex [cephalexin]; Adhesive [tape]; Latex; and Sulfonamide derivatives   Social History:  The patient  reports that she quit smoking about 22 years ago. Her smoking use included Cigarettes. She has never used smokeless tobacco. She reports that she does not drink alcohol or use drugs.   Family History:  The patient's family history includes Kidney disease in  her maternal grandfather; Other in her maternal grandfather; Stroke in her maternal grandmother; Stroke (age of onset: 52) in her mother.    ROS:  Please see the history of present illness.  She reports occasional back pain.   All other systems are reviewed and negative.    PHYSICAL EXAM: VS:  BP (!) 158/78   Pulse (!) 56   Ht 5\' 5"  (1.651 m)   Wt 173 lb 9.6 oz (78.7 kg)   SpO2 98%   BMI 28.89 kg/m  , BMI Body mass index is 28.89 kg/m. GEN: Well nourished, well developed, in no acute distress  HEENT: normal  Neck: no JVD, carotid bruits, or masses Cardiac: RRR; no murmurs, rubs, or gallops,no edema  Respiratory:  clear to auscultation bilaterally, normal work of breathing GI: soft, nontender, nondistended, + BS MS: no deformity or atrophy  Skin: warm and dry  Neuro:  Strength and sensation are intact Psych: euthymic mood, full affect ILR site is well healed  ILR ILR interrogation today is reviewed (see paceart)  Recent Labs: 02/21/2016: BUN 10; Creatinine, Ser 0.68; Hemoglobin 14.8; Platelets 188; Potassium 3.4; Sodium 131    Lipid Panel  No results found for: CHOL, TRIG, HDL, CHOLHDL, VLDL, LDLCALC, LDLDIRECT   Wt Readings from Last 3 Encounters:  06/10/16 173 lb 9.6 oz (78.7 kg)  05/08/16 171 lb 9.6 oz (77.8 kg)  02/21/16 163 lb (73.9 kg)    ekg today reveals sinus bradycardia 49 bpm, blocked pACs noted otherwise normal ekg  ASSESSMENT AND PLAN:  1.  afib Well controlled ILR interrogation reveals no afib since October 2016.  Compliance with eliquis is encouraged (chads2vasc score is at least 4).   2. HTN Elevated Increase lisinopril to 20mg  daily today She is instructed to follow-up with PCP for repeat bp check as well as bmet.  3. noctural pauses asymptomatic  Current medicines are reviewed at length with the patient today.   The patient does not have concerns regarding her medicines.  The following changes were made today:  none  Disposition:   FU with  Roderic Palau NP in 6 months.  I will see in a year.  carelink   Army Fossa, MD  06/10/2016 11:18 AM     [facility] [address] [place] [address] [phone] (office) [phone] (fax)

## 2016-06-26 ENCOUNTER — Ambulatory Visit (INDEPENDENT_AMBULATORY_CARE_PROVIDER_SITE_OTHER): Payer: PPO | Admitting: *Deleted

## 2016-06-26 DIAGNOSIS — I495 Sick sinus syndrome: Secondary | ICD-10-CM | POA: Diagnosis not present

## 2016-06-27 NOTE — Progress Notes (Signed)
Carelink Summary Report / Loop Recorder 

## 2016-07-04 DIAGNOSIS — J329 Chronic sinusitis, unspecified: Secondary | ICD-10-CM | POA: Diagnosis not present

## 2016-07-04 DIAGNOSIS — I10 Essential (primary) hypertension: Secondary | ICD-10-CM | POA: Diagnosis not present

## 2016-07-04 DIAGNOSIS — I4891 Unspecified atrial fibrillation: Secondary | ICD-10-CM | POA: Diagnosis not present

## 2016-07-04 DIAGNOSIS — E119 Type 2 diabetes mellitus without complications: Secondary | ICD-10-CM | POA: Diagnosis not present

## 2016-07-06 LAB — CUP PACEART REMOTE DEVICE CHECK
Implantable Pulse Generator Implant Date: 20160126
MDC IDC SESS DTM: 20171128004330

## 2016-07-06 NOTE — Progress Notes (Signed)
Carelink summary report received. Battery status OK. Normal device function. No new symptom episodes, tachy episodes, brady, or pause episodes. No new AF episodes. Monthly summary reports and ROV/PRN 

## 2016-07-16 DIAGNOSIS — Z23 Encounter for immunization: Secondary | ICD-10-CM | POA: Diagnosis not present

## 2016-07-26 ENCOUNTER — Ambulatory Visit (INDEPENDENT_AMBULATORY_CARE_PROVIDER_SITE_OTHER): Payer: PPO | Admitting: *Deleted

## 2016-07-26 DIAGNOSIS — I4891 Unspecified atrial fibrillation: Secondary | ICD-10-CM | POA: Diagnosis not present

## 2016-07-29 NOTE — Progress Notes (Signed)
Carelink Summary Report / Loop Recorder 

## 2016-08-08 LAB — CUP PACEART REMOTE DEVICE CHECK
Implantable Pulse Generator Implant Date: 20160126
MDC IDC SESS DTM: 20171228010853

## 2016-08-25 LAB — CUP PACEART REMOTE DEVICE CHECK
Date Time Interrogation Session: 20180127013802
Implantable Pulse Generator Implant Date: 20160126

## 2016-08-26 ENCOUNTER — Ambulatory Visit (INDEPENDENT_AMBULATORY_CARE_PROVIDER_SITE_OTHER): Payer: PPO | Admitting: *Deleted

## 2016-08-26 DIAGNOSIS — I4891 Unspecified atrial fibrillation: Secondary | ICD-10-CM | POA: Diagnosis not present

## 2016-08-26 NOTE — Progress Notes (Signed)
Carelink Summary Report / Loop Recorder 

## 2016-09-11 LAB — CUP PACEART REMOTE DEVICE CHECK
Date Time Interrogation Session: 20180226013645
MDC IDC PG IMPLANT DT: 20160126

## 2016-09-24 ENCOUNTER — Ambulatory Visit (INDEPENDENT_AMBULATORY_CARE_PROVIDER_SITE_OTHER): Payer: PPO | Admitting: *Deleted

## 2016-09-24 DIAGNOSIS — I495 Sick sinus syndrome: Secondary | ICD-10-CM

## 2016-09-24 DIAGNOSIS — I4891 Unspecified atrial fibrillation: Secondary | ICD-10-CM

## 2016-09-25 NOTE — Progress Notes (Signed)
Carelink Summary Report / Loop Recorder 

## 2016-10-02 DIAGNOSIS — Z0000 Encounter for general adult medical examination without abnormal findings: Secondary | ICD-10-CM | POA: Diagnosis not present

## 2016-10-03 ENCOUNTER — Other Ambulatory Visit (HOSPITAL_COMMUNITY): Payer: Self-pay | Admitting: Pulmonary Disease

## 2016-10-03 ENCOUNTER — Ambulatory Visit (HOSPITAL_COMMUNITY)
Admission: RE | Admit: 2016-10-03 | Discharge: 2016-10-03 | Disposition: A | Discharge status: Home / Self Care | Payer: PPO | Source: Ambulatory Visit | Attending: Pulmonary Disease | Admitting: Pulmonary Disease

## 2016-10-03 DIAGNOSIS — M25562 Pain in left knee: Secondary | ICD-10-CM

## 2016-10-03 DIAGNOSIS — I708 Atherosclerosis of other arteries: Secondary | ICD-10-CM | POA: Insufficient documentation

## 2016-10-03 DIAGNOSIS — M47896 Other spondylosis, lumbar region: Secondary | ICD-10-CM | POA: Insufficient documentation

## 2016-10-03 DIAGNOSIS — M545 Low back pain: Secondary | ICD-10-CM | POA: Diagnosis not present

## 2016-10-03 DIAGNOSIS — I700 Atherosclerosis of aorta: Secondary | ICD-10-CM | POA: Insufficient documentation

## 2016-10-03 DIAGNOSIS — M8588 Other specified disorders of bone density and structure, other site: Secondary | ICD-10-CM | POA: Diagnosis not present

## 2016-10-03 DIAGNOSIS — M1712 Unilateral primary osteoarthritis, left knee: Secondary | ICD-10-CM | POA: Diagnosis not present

## 2016-10-04 DIAGNOSIS — I10 Essential (primary) hypertension: Secondary | ICD-10-CM | POA: Diagnosis not present

## 2016-10-04 DIAGNOSIS — I4891 Unspecified atrial fibrillation: Secondary | ICD-10-CM | POA: Diagnosis not present

## 2016-10-04 DIAGNOSIS — M179 Osteoarthritis of knee, unspecified: Secondary | ICD-10-CM | POA: Diagnosis not present

## 2016-10-04 DIAGNOSIS — E119 Type 2 diabetes mellitus without complications: Secondary | ICD-10-CM | POA: Diagnosis not present

## 2016-10-04 DIAGNOSIS — E785 Hyperlipidemia, unspecified: Secondary | ICD-10-CM | POA: Diagnosis not present

## 2016-10-04 DIAGNOSIS — K219 Gastro-esophageal reflux disease without esophagitis: Secondary | ICD-10-CM | POA: Diagnosis not present

## 2016-10-04 DIAGNOSIS — M25562 Pain in left knee: Secondary | ICD-10-CM | POA: Diagnosis not present

## 2016-10-04 DIAGNOSIS — Z0000 Encounter for general adult medical examination without abnormal findings: Secondary | ICD-10-CM | POA: Diagnosis not present

## 2016-10-09 ENCOUNTER — Telehealth: Payer: Self-pay

## 2016-10-09 NOTE — Telephone Encounter (Signed)
We received a PA approval on the pts Ondansetron from [employer]	[place]. Approval good from 10/07/2016 until 10/06/2016.

## 2016-10-10 LAB — CUP PACEART REMOTE DEVICE CHECK
Date Time Interrogation Session: 20180328014230
Implantable Pulse Generator Implant Date: 20160126

## 2016-10-16 DIAGNOSIS — M25462 Effusion, left knee: Secondary | ICD-10-CM | POA: Diagnosis not present

## 2016-10-16 DIAGNOSIS — M25562 Pain in left knee: Secondary | ICD-10-CM | POA: Diagnosis not present

## 2016-10-16 DIAGNOSIS — M170 Bilateral primary osteoarthritis of knee: Secondary | ICD-10-CM | POA: Diagnosis not present

## 2016-10-16 DIAGNOSIS — M25561 Pain in right knee: Secondary | ICD-10-CM | POA: Diagnosis not present

## 2016-10-16 DIAGNOSIS — M1712 Unilateral primary osteoarthritis, left knee: Secondary | ICD-10-CM | POA: Diagnosis not present

## 2016-10-17 ENCOUNTER — Ambulatory Visit: Payer: PPO | Admitting: Orthopaedic Surgery

## 2016-10-24 ENCOUNTER — Ambulatory Visit (INDEPENDENT_AMBULATORY_CARE_PROVIDER_SITE_OTHER): Payer: PPO | Admitting: *Deleted

## 2016-10-24 DIAGNOSIS — I4891 Unspecified atrial fibrillation: Secondary | ICD-10-CM | POA: Diagnosis not present

## 2016-10-25 NOTE — Progress Notes (Signed)
Carelink Summary Report / Loop Recorder 

## 2016-10-29 DIAGNOSIS — Z8719 Personal history of other diseases of the digestive system: Secondary | ICD-10-CM

## 2016-10-29 HISTORY — DX: Personal history of other diseases of the digestive system: Z87.19

## 2016-11-13 LAB — CUP PACEART REMOTE DEVICE CHECK
Date Time Interrogation Session: 20180427020649
MDC IDC PG IMPLANT DT: 20160126

## 2016-11-19 DIAGNOSIS — A084 Viral intestinal infection, unspecified: Secondary | ICD-10-CM | POA: Diagnosis not present

## 2016-11-19 DIAGNOSIS — I10 Essential (primary) hypertension: Secondary | ICD-10-CM | POA: Diagnosis not present

## 2016-11-19 DIAGNOSIS — E119 Type 2 diabetes mellitus without complications: Secondary | ICD-10-CM | POA: Diagnosis not present

## 2016-11-20 ENCOUNTER — Emergency Department (HOSPITAL_COMMUNITY): Payer: PPO

## 2016-11-20 ENCOUNTER — Inpatient Hospital Stay (HOSPITAL_COMMUNITY)
Admission: EM | Admit: 2016-11-20 | Discharge: 2016-11-26 | DRG: 392 | Disposition: A | Discharge status: Home / Self Care | Payer: PPO | Attending: Pulmonary Disease | Admitting: Pulmonary Disease

## 2016-11-20 ENCOUNTER — Encounter (HOSPITAL_COMMUNITY): Payer: Self-pay

## 2016-11-20 DIAGNOSIS — K529 Noninfective gastroenteritis and colitis, unspecified: Secondary | ICD-10-CM | POA: Diagnosis not present

## 2016-11-20 DIAGNOSIS — N289 Disorder of kidney and ureter, unspecified: Secondary | ICD-10-CM

## 2016-11-20 DIAGNOSIS — E876 Hypokalemia: Secondary | ICD-10-CM | POA: Diagnosis present

## 2016-11-20 DIAGNOSIS — Z91040 Latex allergy status: Secondary | ICD-10-CM | POA: Diagnosis not present

## 2016-11-20 DIAGNOSIS — Z79899 Other long term (current) drug therapy: Secondary | ICD-10-CM

## 2016-11-20 DIAGNOSIS — R197 Diarrhea, unspecified: Secondary | ICD-10-CM | POA: Diagnosis not present

## 2016-11-20 DIAGNOSIS — I4892 Unspecified atrial flutter: Secondary | ICD-10-CM | POA: Diagnosis present

## 2016-11-20 DIAGNOSIS — Z7984 Long term (current) use of oral hypoglycemic drugs: Secondary | ICD-10-CM | POA: Diagnosis not present

## 2016-11-20 DIAGNOSIS — K219 Gastro-esophageal reflux disease without esophagitis: Secondary | ICD-10-CM | POA: Diagnosis present

## 2016-11-20 DIAGNOSIS — I9589 Other hypotension: Secondary | ICD-10-CM

## 2016-11-20 DIAGNOSIS — I4891 Unspecified atrial fibrillation: Secondary | ICD-10-CM | POA: Diagnosis present

## 2016-11-20 DIAGNOSIS — Z882 Allergy status to sulfonamides status: Secondary | ICD-10-CM

## 2016-11-20 DIAGNOSIS — N179 Acute kidney failure, unspecified: Secondary | ICD-10-CM | POA: Diagnosis present

## 2016-11-20 DIAGNOSIS — I10 Essential (primary) hypertension: Secondary | ICD-10-CM | POA: Diagnosis not present

## 2016-11-20 DIAGNOSIS — I480 Paroxysmal atrial fibrillation: Secondary | ICD-10-CM | POA: Diagnosis not present

## 2016-11-20 DIAGNOSIS — E860 Dehydration: Secondary | ICD-10-CM | POA: Diagnosis present

## 2016-11-20 DIAGNOSIS — I959 Hypotension, unspecified: Secondary | ICD-10-CM | POA: Diagnosis present

## 2016-11-20 DIAGNOSIS — E869 Volume depletion, unspecified: Secondary | ICD-10-CM | POA: Diagnosis present

## 2016-11-20 DIAGNOSIS — Z881 Allergy status to other antibiotic agents status: Secondary | ICD-10-CM | POA: Diagnosis not present

## 2016-11-20 DIAGNOSIS — A084 Viral intestinal infection, unspecified: Principal | ICD-10-CM | POA: Diagnosis present

## 2016-11-20 DIAGNOSIS — E785 Hyperlipidemia, unspecified: Secondary | ICD-10-CM | POA: Diagnosis present

## 2016-11-20 DIAGNOSIS — E119 Type 2 diabetes mellitus without complications: Secondary | ICD-10-CM | POA: Diagnosis not present

## 2016-11-20 LAB — COMPREHENSIVE METABOLIC PANEL
ALT: 32 U/L (ref 14–54)
AST: 30 U/L (ref 15–41)
Albumin: 3.7 g/dL (ref 3.5–5.0)
Alkaline Phosphatase: 58 U/L (ref 38–126)
Anion gap: 11 (ref 5–15)
BUN: 24 mg/dL — ABNORMAL HIGH (ref 6–20)
CALCIUM: 9.3 mg/dL (ref 8.9–10.3)
CHLORIDE: 96 mmol/L — AB (ref 101–111)
CO2: 24 mmol/L (ref 22–32)
CREATININE: 1.56 mg/dL — AB (ref 0.44–1.00)
GFR, EST AFRICAN AMERICAN: 37 mL/min — AB (ref >60)
GFR, EST NON AFRICAN AMERICAN: 32 mL/min — AB (ref >60)
Glucose, Bld: 156 mg/dL — ABNORMAL HIGH (ref 65–99)
Potassium: 3.3 mmol/L — ABNORMAL LOW (ref 3.5–5.1)
Sodium: 131 mmol/L — ABNORMAL LOW (ref 135–145)
Total Bilirubin: 0.6 mg/dL (ref 0.3–1.2)
Total Protein: 7 g/dL (ref 6.5–8.1)

## 2016-11-20 LAB — CBC WITH DIFFERENTIAL/PLATELET
BASOS ABS: 0 10*3/uL (ref 0.0–0.1)
BASOS PCT: 0 %
EOS ABS: 0 10*3/uL (ref 0.0–0.7)
Eosinophils Relative: 0 %
HCT: 39.6 % (ref 36.0–46.0)
HEMOGLOBIN: 14.1 g/dL (ref 12.0–15.0)
LYMPHS ABS: 1.4 10*3/uL (ref 0.7–4.0)
Lymphocytes Relative: 15 %
MCH: 29.5 pg (ref 26.0–34.0)
MCHC: 35.6 g/dL (ref 30.0–36.0)
MCV: 82.8 fL (ref 78.0–100.0)
Monocytes Absolute: 2 10*3/uL — ABNORMAL HIGH (ref 0.1–1.0)
Monocytes Relative: 21 %
NEUTROS PCT: 64 %
Neutro Abs: 6 10*3/uL (ref 1.7–7.7)
Platelets: 210 10*3/uL (ref 150–400)
RBC: 4.78 MIL/uL (ref 3.87–5.11)
RDW: 12.4 % (ref 11.5–15.5)
WBC: 9.4 10*3/uL (ref 4.0–10.5)

## 2016-11-20 LAB — LACTIC ACID, PLASMA: LACTIC ACID, VENOUS: 1 mmol/L (ref 0.5–1.9)

## 2016-11-20 MED ORDER — SODIUM CHLORIDE 0.9 % IV BOLUS (SEPSIS)
250.0000 mL | Freq: Once | INTRAVENOUS | Status: AC
  Administered 2016-11-20: 250 mL via INTRAVENOUS

## 2016-11-20 MED ORDER — IOPAMIDOL (ISOVUE-300) INJECTION 61%
75.0000 mL | Freq: Once | INTRAVENOUS | Status: AC | PRN
  Administered 2016-11-21: 75 mL via INTRAVENOUS

## 2016-11-20 MED ORDER — ONDANSETRON HCL 4 MG/2ML IJ SOLN
4.0000 mg | Freq: Once | INTRAMUSCULAR | Status: AC
  Administered 2016-11-20: 4 mg via INTRAVENOUS
  Filled 2016-11-20: qty 2

## 2016-11-20 MED ORDER — FENTANYL CITRATE (PF) 100 MCG/2ML IJ SOLN
25.0000 ug | Freq: Once | INTRAMUSCULAR | Status: AC
  Administered 2016-11-20: 25 ug via INTRAVENOUS
  Filled 2016-11-20: qty 2

## 2016-11-20 MED ORDER — SODIUM CHLORIDE 0.9 % IV BOLUS (SEPSIS)
1000.0000 mL | Freq: Once | INTRAVENOUS | Status: AC
  Administered 2016-11-20: 1000 mL via INTRAVENOUS

## 2016-11-20 NOTE — ED Triage Notes (Signed)
Pt reports abd pain with N/V/D and generalized weakness that started 3 days ago. Reports being seen by PCP on Tuesday sent home with Dx of virus and instructed to come back in a few days if not improved. Pt reports fever today, controlled with Tylenol-last dose 2 hours PTA.  Reports taking imodium about 3 hours ago with no relief.

## 2016-11-20 NOTE — ED Notes (Signed)
Assumed care of patient from [place], [place]. PT resting quietly in no distress. BP improving after fluid bolus.

## 2016-11-20 NOTE — ED Provider Notes (Signed)
[facility] DEPT Provider Note   CSN: [id number] Arrival date & time: 11/20/16  2201  By signing my name below, I, Erika Lee, attest that this documentation has been prepared under the direction and in the presence of Erika Porter, MD. Electronically Signed: Margit Lee, ED Scribe. 11/20/16. 11:38 PM.  Time Seen: 23:25 PM  History   Chief Complaint Chief Complaint  Patient presents with  . Abdominal Pain  . N/V/D    HPI Erika Lee is a 71 y.o. female who presents to the Emergency Department complaining of intermittent pressure to her upper abdomen that started on Sunday evening, 11/17/16. The pain is mostly above her belly button and only lasts for ~ 5-10 minutes at a time. Abdominal discomfort is not alleviated after diarrhea or vomiting. Associated sx include weakness, dizziness, lightheadedness, shaky, fever (102)Last night, diarrhea (loose, watery, every 30 min), nausea, vomiting (2x a day), without hematuresis, blood in stool. She does c/o loss of appetite. Her husband hasn't been sick. Not sure if she has been around anyone else who was sick. Didn't have any recalled eggs or romaine lettuce. Doesn't smoke or drink. Pt denies chills. She states if she stands up she feels very weak and jittery. She states she feels like she might pass out.  Patient has a history of paroxysmal atrial fibrillation. She was seen by Dr. Rayann Heman in January 2015 and had ablation done. She has a loop monitor which is downloaded every night. She states she was told she only had one episode of atrial fibrillation lasting about 15 minutes that she was unaware of.  PCP: Erika Du, MD  The history is provided by the patient. No language interpreter was used.    Past Medical History:  Diagnosis Date  . Arthritis   . Atrial flutter ([place])    s/p CTI by [name] 07/16/2013  . Diabetes mellitus    Borderline  . Essential hypertension, benign   . GERD (gastroesophageal reflux disease)   .  Hemorrhoids   . Hyperlipidemia   . Multinodular goiter   . Osteoarthritis   . Palpitations   . Paroxysmal atrial fibrillation ([facility])    s/p PVI by [name] 07/16/2013    Patient Active Problem List   Diagnosis Date Noted  . Shortness of breath 07/25/2014  . Influenza due to identified novel influenza A virus with other respiratory manifestations 08/03/2013  . Generalized weakness 07/29/2013  . Cough 07/29/2013  . Hyponatremia 07/28/2013  . Atrial fibrillation ([place]) 07/16/2013  . Atrial flutter ([place]) 07/07/2013  . Sinus bradycardia 06/11/2013  . Tachycardia-bradycardia ([place]) 06/11/2013  . Type 2 diabetes mellitus ([place]) 05/13/2013  . Paroxysmal atrial fibrillation ([place]) 09/03/2010  . Essential hypertension, benign 03/10/2007  . GERD 03/10/2007    Past Surgical History:  Procedure Laterality Date  . ATRIAL FIBRILLATION ABLATION N/A 07/16/2013   PVI and CTI ablation by [name]  . BIOPSY THYROID    . COLONOSCOPY  09/10/2011   Procedure: COLONOSCOPY;  Surgeon: Jamesetta So, MD;  Location: [facility] ENDO SUITE;  Service: Gastroenterology;  Laterality: N/A;  . LESION REMOVAL N/A 05/03/2013   Procedure: EXCISION CYST, BACK;  Surgeon: Jamesetta So, MD;  Location: [facility] ORS;  Service: General;  Laterality: N/A;  . LOOP RECORDER IMPLANT N/A 07/26/2014   Procedure: LOOP RECORDER IMPLANT;  Surgeon: Thompson Grayer, MD;  Location: [facility] CATH LAB;  Service: Cardiovascular;  Laterality: N/A;  . TEE WITHOUT CARDIOVERSION N/A 07/15/2013   Procedure: TRANSESOPHAGEAL ECHOCARDIOGRAM (TEE);  Surgeon: Aaron Edelman  Jacalyn Lefevre, MD;  Location: [facility] ENDOSCOPY;  Service: Cardiovascular;  Laterality: N/A;  . TONSILLECTOMY    . TRANSANAL HEMORRHOIDAL DEARTERIALIZATION N/A 09/22/2015   Procedure: TRANSANAL HEMORRHOIDAL LIGATION/PEXY EUA POSSIBLE HEMORRHOIDECTOMY ;  Surgeon: Michael Boston, MD;  Location: [facility] ORS;  Service: General;  Laterality: N/A;    OB History    No data available       Home Medications    Prior to  Admission medications   Medication Sig Start Date End Date Taking? Authorizing Provider  apixaban (ELIQUIS) 5 MG TABS tablet Take 1 tablet (5 mg total) by mouth 2 (two) times daily. 07/25/14  Yes Allred, Jeneen Rinks, MD  esomeprazole (NEXIUM) 20 MG capsule Take 20 mg by mouth daily.    Yes [provider]  hydrochlorothiazide 25 MG tablet Take 25 mg by mouth daily.     Yes [provider]  HYDROcodone-acetaminophen (NORCO) 7.5-325 MG tablet Take 1-2 tablets by mouth every 4 (four) hours as needed for moderate pain or severe pain. 09/22/15  Yes Michael Boston, MD  lisinopril (PRINIVIL,ZESTRIL) 20 MG tablet Take 1 tablet (20 mg total) by mouth daily. 06/10/16 11/20/16 Yes Allred, Jeneen Rinks, MD  metFORMIN (GLUCOPHAGE) 500 MG tablet Take 1 tablet (500 mg total) by mouth daily. 07/17/13  Yes Brett Canales, PA-C  metoprolol succinate (TOPROL XL) 25 MG 24 hr tablet Take 0.5 tablets (12.5 mg total) by mouth daily. 04/30/16  Yes Allred, Jeneen Rinks, MD  ondansetron (ZOFRAN-ODT) 8 MG disintegrating tablet PLACE 1 TABLET UNDER THE TONGUE AS NEEDED FOR NAUSEA AND VOMITING 11/19/16  Yes [provider]  Potassium Gluconate 550 MG TABS Take 550 mg by mouth daily.    Yes [provider]  simvastatin (ZOCOR) 40 MG tablet Take 40 mg by mouth at bedtime.     Yes [provider]    Family History Family History  Problem Relation Age of Onset  . Stroke Mother 47  . Stroke Maternal Grandmother        stroke  . Other Maternal Grandfather        "heart dropsy" kidney issues  . Kidney disease Maternal Grandfather     Social History Social History  Substance Use Topics  . Smoking status: Former Smoker    Types: Cigarettes    Quit date: 01/02/1994  . Smokeless tobacco: Never Used  . Alcohol use No  lives at home Lives with spouse   Allergies   Clindamycin/lincomycin; Doxycycline; Keflex [cephalexin]; Adhesive [tape]; Latex; and Sulfonamide derivatives   Review of Systems Review  of Systems  Constitutional: Positive for appetite change and fever. Negative for chills.  Gastrointestinal: Positive for abdominal pain, diarrhea, nausea and vomiting.  Genitourinary: Positive for decreased urine volume.  Neurological: Positive for dizziness, weakness and light-headedness.  All other systems reviewed and are negative.    Physical Exam Updated Vital Signs BP (!) 85/45 (BP Location: Right Arm)   Pulse 64   Temp 98.6 F (37 C) (Oral)   Resp 17   Ht 5\' 5"  (1.651 m)   Wt 160 lb (72.6 kg)   SpO2 96%   BMI 26.63 kg/m   Vital signs normal except for hypotension, heart rate is normal however she is on a beta blocker   Physical Exam  Constitutional: She is oriented to person, place, and time. She appears well-developed and well-nourished.  Non-toxic appearance. She does not appear ill. No distress.  HENT:  Head: Normocephalic and atraumatic.  Right Ear: External ear normal.  Left Ear: External  ear normal.  Nose: Nose normal. No mucosal edema or rhinorrhea.  Mouth/Throat: Mucous membranes are dry. No dental abscesses or uvula swelling.  Eyes: Conjunctivae and EOM are normal. Pupils are equal, round, and reactive to light.  Neck: Normal range of motion and full passive range of motion without pain. Neck supple.  Cardiovascular: Normal rate, regular rhythm and normal heart sounds.  Exam reveals no gallop and no friction rub.   No murmur heard. Pulmonary/Chest: Effort normal and breath sounds normal. No respiratory distress. She has no wheezes. She has no rhonchi. She has no rales. She exhibits no tenderness and no crepitus.  Abdominal: Soft. Normal appearance. She exhibits no distension. Bowel sounds are increased. There is tenderness (less so in LUQ and RUQ) in the epigastric area. There is no rebound and no guarding.    Musculoskeletal: Normal range of motion. She exhibits no edema or tenderness.  Moves all extremities well.   Neurological: She is alert and oriented  to person, place, and time. She has normal strength. No cranial nerve deficit.  Skin: Skin is warm, dry and intact. No rash noted. No erythema. There is pallor.  Psychiatric: She has a normal mood and affect. Her speech is normal and behavior is normal. Her mood appears not anxious.  Nursing note and vitals reviewed.    ED Treatments / Results  DIAGNOSTIC STUDIES: Oxygen Saturation is 96% on RA, adequate by my interpretation.   Labs (all labs ordered are listed, but only abnormal results are displayed) Results for orders placed or performed during the hospital encounter of 11/20/16  Blood Culture (routine x 2)  Result Value Ref Range   Specimen Description RIGHT ANTECUBITAL    Special Requests      BOTTLES DRAWN AEROBIC AND ANAEROBIC Blood Culture adequate volume   Culture PENDING    Report Status PENDING   Blood Culture (routine x 2)  Result Value Ref Range   Specimen Description BLOOD LEFT HAND    Special Requests      BOTTLES DRAWN AEROBIC AND ANAEROBIC Blood Culture adequate volume   Culture PENDING    Report Status PENDING   Comprehensive metabolic panel  Result Value Ref Range   Sodium 131 (L) 135 - 145 mmol/L   Potassium 3.3 (L) 3.5 - 5.1 mmol/L   Chloride 96 (L) 101 - 111 mmol/L   CO2 24 22 - 32 mmol/L   Glucose, Bld 156 (H) 65 - 99 mg/dL   BUN 24 (H) 6 - 20 mg/dL   Creatinine, Ser 1.56 (H) 0.44 - 1.00 mg/dL   Calcium 9.3 8.9 - 10.3 mg/dL   Total Protein 7.0 6.5 - 8.1 g/dL   Albumin 3.7 3.5 - 5.0 g/dL   AST 30 15 - 41 U/L   ALT 32 14 - 54 U/L   Alkaline Phosphatase 58 38 - 126 U/L   Total Bilirubin 0.6 0.3 - 1.2 mg/dL   GFR calc non Af Amer 32 (L) >60 mL/min   GFR calc Af Amer 37 (L) >60 mL/min   Anion gap 11 5 - 15  CBC WITH DIFFERENTIAL  Result Value Ref Range   WBC 9.4 4.0 - 10.5 K/uL   RBC 4.78 3.87 - 5.11 MIL/uL   Hemoglobin 14.1 12.0 - 15.0 g/dL   HCT 39.6 36.0 - 46.0 %   MCV 82.8 78.0 - 100.0 fL   MCH 29.5 26.0 - 34.0 pg   MCHC 35.6 30.0 - 36.0  g/dL   RDW 12.4 11.5 - 15.5 %  Platelets 210 150 - 400 K/uL   Neutrophils Relative % 64 %   Neutro Abs 6.0 1.7 - 7.7 K/uL   Lymphocytes Relative 15 %   Lymphs Abs 1.4 0.7 - 4.0 K/uL   Monocytes Relative 21 %   Monocytes Absolute 2.0 (H) 0.1 - 1.0 K/uL   Eosinophils Relative 0 %   Eosinophils Absolute 0.0 0.0 - 0.7 K/uL   Basophils Relative 0 %   Basophils Absolute 0.0 0.0 - 0.1 K/uL  Lactic acid, plasma  Result Value Ref Range   Lactic Acid, Venous 1.0 0.5 - 1.9 mmol/L  Lactic acid, plasma  Result Value Ref Range   Lactic Acid, Venous 0.9 0.5 - 1.9 mmol/L    Laboratory interpretation all normal except Hyponatremia, hypokalemia, low chloride, new acute renal insufficiency   EKG  EKG Interpretation None      ED ECG REPORT   Date: 11/21/2016  Rate: 76  Rhythm: normal sinus rhythm  QRS Axis: normal  Intervals: normal  ST/T Wave abnormalities: normal  Conduction Disutrbances:none  Narrative Interpretation: supraventricular atrial bigeminy  Old EKG Reviewed: none available  I have personally reviewed the EKG tracing and agree with the computerized printout as noted.   Radiology Ct Abdomen Pelvis W Contrast  Result Date: 11/21/2016 CLINICAL DATA:  Acute onset of upper abdominal pain, diarrhea and vomiting. Generalized weakness and lightheadedness. Weakness and fever. Initial encounter. EXAM: CT ABDOMEN AND PELVIS WITH CONTRAST TECHNIQUE: Multidetector CT imaging of the abdomen and pelvis was performed using the standard protocol following bolus administration of intravenous contrast. CONTRAST:  23mL ISOVUE-300 IOPAMIDOL (ISOVUE-300) INJECTION 61% COMPARISON:  CT of the abdomen and pelvis performed 10/25/2013 FINDINGS: Lower chest: The visualized lung bases are grossly clear. The visualized portions of the mediastinum are unremarkable. Hepatobiliary: There is minimal prominence of the intrahepatic biliary ducts. The common bile duct measures up to 1.3 cm in diameter,  concerning for distal obstruction. The gallbladder is grossly unremarkable. The liver is otherwise unremarkable in appearance. Pancreas: The pancreas is within normal limits. Spleen: A 1.4 cm nonspecific hypodensity is noted at the anterior aspect of the spleen. Adrenals/Urinary Tract: The adrenal glands are unremarkable in appearance. The kidneys are within normal limits. There is no evidence of hydronephrosis. No renal or ureteral stones are identified. No perinephric stranding is seen. Stomach/Bowel: The appendix is not definitely seen. Mild wall thickening is noted along the cecum and ascending colon, concerning for infectious or inflammatory colitis. The remainder of the colon is grossly unremarkable. The small bowel is grossly unremarkable. The stomach is grossly unremarkable. Vascular/Lymphatic: Scattered calcification is seen along the abdominal aorta and its branches. The abdominal aorta is otherwise grossly unremarkable. The inferior vena cava is grossly unremarkable. No retroperitoneal lymphadenopathy is seen. No pelvic sidewall lymphadenopathy is identified. Reproductive: The bladder is decompressed and not well assessed. The uterus is unremarkable, aside from a calcified uterine fibroid. The ovaries are relatively symmetric. No suspicious adnexal masses are seen. Trace free fluid is noted within the pelvis. Other: No additional soft tissue abnormalities are seen. Musculoskeletal: No acute osseous abnormalities are identified. Sclerosis is noted at the pubic symphysis. Endplate sclerosis is noted at L4-L5, with multilevel vacuum phenomenon along the lumbar spine. The visualized musculature is unremarkable in appearance. IMPRESSION: 1. Mild wall thickening along the cecum and ascending colon, concerning for infectious or inflammatory colitis. 2. Common bile duct measures up to 1.3 cm in diameter, raising concern for distal obstruction. Minimal prominence of the intrahepatic biliary ducts. Would correlate  with LFTs, and consider MRCP or ERCP for further evaluation. 3. Scattered aortic atherosclerosis. 4. Calcified uterine fibroid noted. 5. 1.4 cm nonspecific hypodensity at the anterior aspect of the spleen has increased only mildly in size from 2015 and is likely benign. 6. Mild degenerative change along the lumbar spine. Electronically Signed   By: Garald Balding M.D.   On: 11/21/2016 00:39    Procedures Procedures (including critical care time)  CRITICAL CARE Performed by:  L  Total critical care time: 46 minutes Critical care time was exclusive of separately billable procedures and treating other patients. Critical care was necessary to treat or prevent imminent or life-threatening deterioration. Critical care was time spent personally by me on the following activities: development of treatment plan with patient and/or surrogate as well as nursing, discussions with consultants, evaluation of patient's response to treatment, examination of patient, obtaining history from patient or surrogate, ordering and performing treatments and interventions, ordering and review of laboratory studies, ordering and review of radiographic studies, pulse oximetry and re-evaluation of patient's condition.   Medications Ordered in ED Medications  sodium chloride 0.9 % bolus 1,000 mL (0 mLs Intravenous Stopped 11/20/16 2345)    And  sodium chloride 0.9 % bolus 1,000 mL (0 mLs Intravenous Stopped 11/21/16 0049)    And  sodium chloride 0.9 % bolus 250 mL (0 mLs Intravenous Stopped 11/20/16 2346)  fentaNYL (SUBLIMAZE) injection 25 mcg (25 mcg Intravenous Given 11/20/16 2354)  ondansetron (ZOFRAN) injection 4 mg (4 mg Intravenous Given 11/20/16 2354)  iopamidol (ISOVUE-300) 61 % injection 75 mL (75 mLs Intravenous Contrast Given 11/21/16 0008)  piperacillin-tazobactam (ZOSYN) IVPB 3.375 g (0 g Intravenous Stopped 11/21/16 0227)  sodium chloride 0.9 % bolus 1,000 mL (0 mLs Intravenous Stopped 11/21/16 0227)    sodium chloride 0.9 % bolus 1,000 mL (1,000 mLs Intravenous New Bag/Given 11/21/16 0227)     Initial Impression / Assessment and Plan / ED Course  I have reviewed the triage vital signs and the nursing notes.  Pertinent labs & imaging results that were available during my care of the patient were reviewed by me and considered in my medical decision making (see chart for details).    Final Clinical Impressions(s) / ED Diagnoses   COORDINATION OF CARE: 11:38 PM-Discussed next steps with pt. Pt verbalized understanding and is agreeable with the plan.At the time of my exam patient's blood pressure was 100/56, she was started on IV fluids. We discussed due to her abdominal pain and the profuse diarrhea CT scan should be done to look for evidence of bowel inflammation or other etiology for her diarrhea and abdominal pain. Patient is agreeable.  Patient has a new acute renal insufficiency, she was given a reduced dose of the IV contrast for her CT scan.  Recheck at 1:30 AM patient states her mouth still feels dry and she has not had any urinary output. She was given an additional 1000 mL of fluids bolus.  After reviewing her CT scan she was started on Zosyn IV.   Recheck at 2:15 AM patient still has not had urinary output. Her blood pressure has dropped into the 80s and 90s again. She was started on her fourth liter of normal saline bolus, plus she has had the 250 mL bolus with the initial orders. We discussed her CT results. It was recommended that she be admitted and she is agreeable. I have not started her on pressors because I feel like she is just severely dehydrated. Her initial lactic  acid and repeat lactic acid are normal. Her total white blood cell count is normal and she does not have a fever, I do not think she has sepsis.  02:54 AM Dr Marin Comment, hospitalist, will admit   Final diagnoses:  Dehydration  Colitis  Other specified hypotension  Acute renal insufficiency    Plan  admission  .Erika Porter, MD, FACEP   I personally performed the services described in this documentation, which was scribed in my presence. The recorded information has been reviewed and considered.  Erika Porter, MD, Barbette Or, MD 11/21/16 [phone]

## 2016-11-21 DIAGNOSIS — I10 Essential (primary) hypertension: Secondary | ICD-10-CM | POA: Diagnosis not present

## 2016-11-21 DIAGNOSIS — R109 Unspecified abdominal pain: Secondary | ICD-10-CM | POA: Diagnosis not present

## 2016-11-21 DIAGNOSIS — E785 Hyperlipidemia, unspecified: Secondary | ICD-10-CM | POA: Diagnosis present

## 2016-11-21 DIAGNOSIS — Z7984 Long term (current) use of oral hypoglycemic drugs: Secondary | ICD-10-CM | POA: Diagnosis not present

## 2016-11-21 DIAGNOSIS — K529 Noninfective gastroenteritis and colitis, unspecified: Secondary | ICD-10-CM | POA: Diagnosis present

## 2016-11-21 DIAGNOSIS — E860 Dehydration: Secondary | ICD-10-CM | POA: Diagnosis not present

## 2016-11-21 DIAGNOSIS — R197 Diarrhea, unspecified: Secondary | ICD-10-CM | POA: Diagnosis present

## 2016-11-21 DIAGNOSIS — E119 Type 2 diabetes mellitus without complications: Secondary | ICD-10-CM | POA: Diagnosis not present

## 2016-11-21 DIAGNOSIS — Z91040 Latex allergy status: Secondary | ICD-10-CM | POA: Diagnosis not present

## 2016-11-21 DIAGNOSIS — I4892 Unspecified atrial flutter: Secondary | ICD-10-CM | POA: Diagnosis not present

## 2016-11-21 DIAGNOSIS — N179 Acute kidney failure, unspecified: Secondary | ICD-10-CM | POA: Diagnosis present

## 2016-11-21 DIAGNOSIS — I480 Paroxysmal atrial fibrillation: Secondary | ICD-10-CM

## 2016-11-21 DIAGNOSIS — E876 Hypokalemia: Secondary | ICD-10-CM | POA: Diagnosis present

## 2016-11-21 DIAGNOSIS — Z881 Allergy status to other antibiotic agents status: Secondary | ICD-10-CM | POA: Diagnosis not present

## 2016-11-21 DIAGNOSIS — I4891 Unspecified atrial fibrillation: Secondary | ICD-10-CM | POA: Diagnosis not present

## 2016-11-21 DIAGNOSIS — E869 Volume depletion, unspecified: Secondary | ICD-10-CM | POA: Diagnosis present

## 2016-11-21 DIAGNOSIS — I9589 Other hypotension: Secondary | ICD-10-CM | POA: Diagnosis not present

## 2016-11-21 DIAGNOSIS — R1110 Vomiting, unspecified: Secondary | ICD-10-CM | POA: Diagnosis not present

## 2016-11-21 DIAGNOSIS — K219 Gastro-esophageal reflux disease without esophagitis: Secondary | ICD-10-CM | POA: Diagnosis not present

## 2016-11-21 DIAGNOSIS — Z882 Allergy status to sulfonamides status: Secondary | ICD-10-CM | POA: Diagnosis not present

## 2016-11-21 DIAGNOSIS — A084 Viral intestinal infection, unspecified: Secondary | ICD-10-CM | POA: Diagnosis not present

## 2016-11-21 DIAGNOSIS — I959 Hypotension, unspecified: Secondary | ICD-10-CM | POA: Diagnosis present

## 2016-11-21 DIAGNOSIS — Z79899 Other long term (current) drug therapy: Secondary | ICD-10-CM | POA: Diagnosis not present

## 2016-11-21 LAB — URINALYSIS, ROUTINE W REFLEX MICROSCOPIC
Bilirubin Urine: NEGATIVE
Glucose, UA: NEGATIVE mg/dL
Ketones, ur: NEGATIVE mg/dL
Leukocytes, UA: NEGATIVE
NITRITE: NEGATIVE
PH: 5 (ref 5.0–8.0)
Protein, ur: NEGATIVE mg/dL
SPECIFIC GRAVITY, URINE: 1.044 — AB (ref 1.005–1.030)

## 2016-11-21 LAB — GLUCOSE, CAPILLARY
Glucose-Capillary: 126 mg/dL — ABNORMAL HIGH (ref 65–99)
Glucose-Capillary: 138 mg/dL — ABNORMAL HIGH (ref 65–99)
Glucose-Capillary: 212 mg/dL — ABNORMAL HIGH (ref 65–99)
Glucose-Capillary: 80 mg/dL (ref 65–99)
Glucose-Capillary: 179 mg/dL — ABNORMAL HIGH (ref 65–99)

## 2016-11-21 LAB — TSH: TSH: 1.153 u[IU]/mL (ref 0.350–4.500)

## 2016-11-21 LAB — COMPREHENSIVE METABOLIC PANEL
ALBUMIN: 2.6 g/dL — AB (ref 3.5–5.0)
ALT: 23 U/L (ref 14–54)
ANION GAP: 8 (ref 5–15)
AST: 22 U/L (ref 15–41)
Alkaline Phosphatase: 41 U/L (ref 38–126)
BILIRUBIN TOTAL: 0.5 mg/dL (ref 0.3–1.2)
BUN: 22 mg/dL — AB (ref 6–20)
CALCIUM: 7.6 mg/dL — AB (ref 8.9–10.3)
CO2: 21 mmol/L — ABNORMAL LOW (ref 22–32)
Chloride: 102 mmol/L (ref 101–111)
Creatinine, Ser: 1.15 mg/dL — ABNORMAL HIGH (ref 0.44–1.00)
GFR calc Af Amer: 53 mL/min — ABNORMAL LOW (ref >60)
GFR calc non Af Amer: 46 mL/min — ABNORMAL LOW (ref >60)
GLUCOSE: 129 mg/dL — AB (ref 65–99)
Potassium: 3.1 mmol/L — ABNORMAL LOW (ref 3.5–5.1)
Sodium: 131 mmol/L — ABNORMAL LOW (ref 135–145)
TOTAL PROTEIN: 5 g/dL — AB (ref 6.5–8.1)

## 2016-11-21 LAB — LACTIC ACID, PLASMA: Lactic Acid, Venous: 0.9 mmol/L (ref 0.5–1.9)

## 2016-11-21 LAB — MRSA PCR SCREENING: MRSA BY PCR: NEGATIVE

## 2016-11-21 MED ORDER — SODIUM CHLORIDE 0.9 % IV BOLUS (SEPSIS)
1000.0000 mL | Freq: Once | INTRAVENOUS | Status: AC
  Administered 2016-11-21: 1000 mL via INTRAVENOUS

## 2016-11-21 MED ORDER — SIMVASTATIN 20 MG PO TABS
40.0000 mg | ORAL_TABLET | Freq: Every day | ORAL | Status: DC
  Administered 2016-11-21 – 2016-11-25 (×5): 40 mg via ORAL
  Filled 2016-11-21 (×5): qty 2

## 2016-11-21 MED ORDER — ACETAMINOPHEN 650 MG RE SUPP: 650.0000 mg | Freq: Four times a day (QID) | RECTAL | Status: DC | PRN

## 2016-11-21 MED ORDER — ONDANSETRON HCL 4 MG/2ML IJ SOLN
4.0000 mg | Freq: Four times a day (QID) | INTRAMUSCULAR | Status: DC | PRN
Start: 2016-11-21 — End: 2016-11-26
  Administered 2016-11-21 – 2016-11-25 (×2): 4 mg via INTRAVENOUS
  Filled 2016-11-21 (×2): qty 2

## 2016-11-21 MED ORDER — ONDANSETRON HCL 4 MG PO TABS: 4.0000 mg | ORAL_TABLET | Freq: Four times a day (QID) | ORAL | Status: DC | PRN

## 2016-11-21 MED ORDER — CIPROFLOXACIN IN D5W 400 MG/200ML IV SOLN
400.0000 mg | Freq: Two times a day (BID) | INTRAVENOUS | Status: DC
  Administered 2016-11-21 – 2016-11-22 (×4): 400 mg via INTRAVENOUS
  Filled 2016-11-21 (×4): qty 200

## 2016-11-21 MED ORDER — KCL IN DEXTROSE-NACL 20-5-0.9 MEQ/L-%-% IV SOLN
INTRAVENOUS | Status: DC
  Administered 2016-11-21: 50 mL via INTRAVENOUS
  Administered 2016-11-22 – 2016-11-23 (×2): via INTRAVENOUS

## 2016-11-21 MED ORDER — INSULIN ASPART 100 UNIT/ML ~~LOC~~ SOLN
0.0000 [IU] | SUBCUTANEOUS | Status: DC
  Administered 2016-11-21: 2 [IU] via SUBCUTANEOUS
  Administered 2016-11-21: 5 [IU] via SUBCUTANEOUS
  Administered 2016-11-21: 3 [IU] via SUBCUTANEOUS
  Administered 2016-11-22 – 2016-11-23 (×6): 2 [IU] via SUBCUTANEOUS
  Administered 2016-11-24: 5 [IU] via SUBCUTANEOUS
  Administered 2016-11-24 – 2016-11-25 (×4): 2 [IU] via SUBCUTANEOUS
  Administered 2016-11-25: 3 [IU] via SUBCUTANEOUS
  Administered 2016-11-25 – 2016-11-26 (×2): 2 [IU] via SUBCUTANEOUS
  Administered 2016-11-26: 3 [IU] via SUBCUTANEOUS

## 2016-11-21 MED ORDER — PIPERACILLIN-TAZOBACTAM 3.375 G IVPB 30 MIN
3.3750 g | Freq: Once | INTRAVENOUS | Status: AC
  Administered 2016-11-21: 3.375 g via INTRAVENOUS
  Filled 2016-11-21: qty 50

## 2016-11-21 MED ORDER — PANTOPRAZOLE SODIUM 40 MG PO TBEC
40.0000 mg | DELAYED_RELEASE_TABLET | Freq: Every day | ORAL | Status: DC
  Administered 2016-11-21 – 2016-11-26 (×6): 40 mg via ORAL
  Filled 2016-11-21 (×6): qty 1

## 2016-11-21 MED ORDER — DIPHENHYDRAMINE HCL 25 MG PO CAPS
25.0000 mg | ORAL_CAPSULE | Freq: Once | ORAL | Status: AC
  Administered 2016-11-21: 25 mg via ORAL
  Filled 2016-11-21: qty 1

## 2016-11-21 MED ORDER — METRONIDAZOLE IN NACL 5-0.79 MG/ML-% IV SOLN
500.0000 mg | Freq: Three times a day (TID) | INTRAVENOUS | Status: DC
  Administered 2016-11-21 – 2016-11-23 (×7): 500 mg via INTRAVENOUS
  Filled 2016-11-21 (×7): qty 100

## 2016-11-21 MED ORDER — APIXABAN 5 MG PO TABS
5.0000 mg | ORAL_TABLET | Freq: Two times a day (BID) | ORAL | Status: DC
  Administered 2016-11-21 – 2016-11-26 (×11): 5 mg via ORAL
  Filled 2016-11-21 (×11): qty 1

## 2016-11-21 MED ORDER — ACETAMINOPHEN 325 MG PO TABS: 650.0000 mg | ORAL_TABLET | Freq: Four times a day (QID) | ORAL | Status: DC | PRN

## 2016-11-21 MED ORDER — HYDROCODONE-ACETAMINOPHEN 7.5-325 MG PO TABS
1.0000 | ORAL_TABLET | ORAL | Status: DC | PRN
  Administered 2016-11-21 – 2016-11-26 (×11): 2 via ORAL
  Filled 2016-11-21 (×11): qty 2

## 2016-11-21 NOTE — Progress Notes (Signed)
Pharmacy Antibiotic Note  Erika Lee is a 71 y.o. female admitted on 11/20/2016 with intra-abdominal infection.  Pharmacy has been consulted for Cipro dosing.  Plan: Cipro 400mg  IV q12h Also on Metronidazole 500mg  IV q12h F/U clinical progress Deescalate tx as indicated  Height: 5\' 5"  (165.1 cm) Weight: 160 lb (72.6 kg) IBW/kg (Calculated) : 57  Temp (24hrs), Avg:98.7 F (37.1 C), Min:98.6 F (37 C), Max:98.7 F (37.1 C)   Recent Labs Lab 11/20/16 2310 11/20/16 2322 11/21/16 0207 11/21/16 0430  WBC 9.4  --   --   --   CREATININE 1.56*  --   --  1.15*  LATICACIDVEN  --  1.0 0.9  --     Estimated Creatinine Clearance: 43.5 mL/min (A) (by C-G formula based on SCr of 1.15 mg/dL (H)).    Allergies  Allergen Reactions  . Clindamycin/Lincomycin Rash  . Doxycycline Other (See Comments)    Makes heart race  . Keflex [Cephalexin] Nausea And Vomiting  . Adhesive [Tape] Rash  . Latex Rash  . Sulfonamide Derivatives Nausea And Vomiting    Antimicrobials this admission: Ciprofloxacin 5/24>>  Metronidazole 5/24 >> Zosyn 5/24 x 1 dose in ED   Dose adjustments this admission: N/A  Microbiology results: 5/23 BCx: pending 5/24 UCx: pending 5/24 MRSA PCR: neg  Thank you for allowing pharmacy to be a part of this patient's care.  Isac Sarna, BS Pharm D, [place] Clinical Pharmacist Pager [phone] 11/21/2016 8:36 AM

## 2016-11-21 NOTE — H&P (Signed)
History and Physical    Erika Lee CWC:[id number] DOB: 15-Aug-1945 DOA: 11/20/2016  PCP: Erika Du, MD  Patient coming from: Home.    Chief Complaint:  Diarrhea and fever.   HPI: Erika Lee is an 71 y.o. female presented to the ER with hx of diarrhea for 4 days, fever to 102 two days ago, but no abdominal pain, nausea, or vomiting.  She has no ill contact, distant travel, or recent antibiotic use.  She lives with her husband and he has no GI symptoms.  She has been on Metformin, but it is not new.  When she was in the ER, she was hypotensive with SBP of 80's, but only mildly symptomatic.  She was given 4 L IVF, and her SBP rose fo 115.  Further work up included a negative lactic acid, K of 3.3 mE/L, and Cr of 1.52.  Her Na was 131.  She has no leukocytosis, and was not feverish in the ER.  An abdominal pelvic CT was done and it showed colitis, along with 1.3cm dilated CBD. Her LFTs were normal.  Hospitalist was asked to admit her for further work up.   ED Course:  See above.  Rewiew of Systems:  Constitutional: Negative for malaise, fever and chills. No significant weight loss or weight gain Eyes: Negative for eye pain, redness and discharge, diplopia, visual changes, or flashes of light. ENMT: Negative for ear pain, hoarseness, nasal congestion, sinus pressure and sore throat. No headaches; tinnitus, drooling, or problem swallowing. Cardiovascular: Negative for chest pain, palpitations, diaphoresis, dyspnea and peripheral edema. ; No orthopnea, PND Respiratory: Negative for cough, hemoptysis, wheezing and stridor. No pleuritic chestpain. Gastrointestinal: Negative for constipation,  melena, blood in stool, hematemesis, jaundice and rectal bleeding.    Genitourinary: Negative for frequency, dysuria, incontinence,flank pain and hematuria; Musculoskeletal: Negative for back pain and neck pain. Negative for swelling and trauma.;  Skin: . Negative for pruritus, rash, abrasions, bruising  and skin lesion.; ulcerations Neuro: Negative for headache, lightheadedness and neck stiffness. Negative for weakness, altered level of consciousness , altered mental status, extremity weakness, burning feet, involuntary movement, seizure and syncope.  Psych: negative for anxiety, depression, insomnia, tearfulness, panic attacks, hallucinations, paranoia, suicidal or homicidal ideation    Past Medical History:  Diagnosis Date  . Arthritis   . Atrial flutter ([place])    s/p CTI by [name] 07/16/2013  . Diabetes mellitus    Borderline  . Essential hypertension, benign   . GERD (gastroesophageal reflux disease)   . Hemorrhoids   . Hyperlipidemia   . Multinodular goiter   . Osteoarthritis   . Palpitations   . Paroxysmal atrial fibrillation (HCC)    s/p PVI by [name] 07/16/2013   Past Surgical History:  Procedure Laterality Date  . ATRIAL FIBRILLATION ABLATION N/A 07/16/2013   PVI and CTI ablation by [name]  . BIOPSY THYROID    . COLONOSCOPY  09/10/2011   Procedure: COLONOSCOPY;  Surgeon: Jamesetta So, MD;  Location: [facility];  Service: Gastroenterology;  Laterality: N/A;  . LESION REMOVAL N/A 05/03/2013   Procedure: EXCISION CYST, BACK;  Surgeon: Jamesetta So, MD;  Location: [facility];  Service: General;  Laterality: N/A;  . LOOP RECORDER IMPLANT N/A 07/26/2014   Procedure: LOOP RECORDER IMPLANT;  Surgeon: Thompson Grayer, MD;  Location: [facility] CATH LAB;  Service: Cardiovascular;  Laterality: N/A;  . TEE WITHOUT CARDIOVERSION N/A 07/15/2013   Procedure: TRANSESOPHAGEAL ECHOCARDIOGRAM (TEE);  Surgeon: Lelon Perla, MD;  Location: [facility] ENDOS[facility];  Service: Cardiovascular;  Laterality: N/A;  . TONSILLECTOMY    . TRANSANAL HEMORRHOIDAL DEARTERIALIZATION N/A 09/22/2015   Procedure: TRANSANAL HEMORRHOIDAL LIGATION/PEXY EUA POSSIBLE HEMORRHOIDECTOMY ;  Surgeon: Michael Boston, MD;  Location: [facility];  Service: General;  Laterality: N/A;     reports that she quit smoking about 22 years ago.  Her smoking use included Cigarettes. She has never used smokeless tobacco. She reports that she does not drink alcohol or use drugs.  Allergies  Allergen Reactions  . Clindamycin/Lincomycin Rash  . Doxycycline Other (See Comments)    Makes heart race  . Keflex [Cephalexin] Nausea And Vomiting  . Adhesive [Tape] Rash  . Latex Rash  . Sulfonamide Derivatives Nausea And Vomiting    Family History  Problem Relation Age of Onset  . Stroke Mother 13  . Stroke Maternal Grandmother        stroke  . Other Maternal Grandfather        "heart dropsy" kidney issues  . Kidney disease Maternal Grandfather      Prior to Admission medications   Medication Sig Start Date End Date Taking? Authorizing Provider  apixaban (ELIQUIS) 5 MG TABS tablet Take 1 tablet (5 mg total) by mouth 2 (two) times daily. 07/25/14  Yes Allred, Jeneen Rinks, MD  esomeprazole (NEXIUM) 20 MG capsule Take 20 mg by mouth daily.    Yes [provider]  hydrochlorothiazide 25 MG tablet Take 25 mg by mouth daily.     Yes [provider]  HYDROcodone-acetaminophen (NORCO) 7.5-325 MG tablet Take 1-2 tablets by mouth every 4 (four) hours as needed for moderate pain or severe pain. 09/22/15  Yes Michael Boston, MD  lisinopril (PRINIVIL,ZESTRIL) 20 MG tablet Take 1 tablet (20 mg total) by mouth daily. 06/10/16 11/20/16 Yes Allred, Jeneen Rinks, MD  metFORMIN (GLUCOPHAGE) 500 MG tablet Take 1 tablet (500 mg total) by mouth daily. 07/17/13  Yes Brett Canales, PA-C  metoprolol succinate (TOPROL XL) 25 MG 24 hr tablet Take 0.5 tablets (12.5 mg total) by mouth daily. 04/30/16  Yes Allred, Jeneen Rinks, MD  ondansetron (ZOFRAN-ODT) 8 MG disintegrating tablet PLACE 1 TABLET UNDER THE TONGUE AS NEEDED FOR NAUSEA AND VOMITING 11/19/16  Yes [provider]  Potassium Gluconate 550 MG TABS Take 550 mg by mouth daily.    Yes [provider]  simvastatin (ZOCOR) 40 MG tablet Take 40 mg by mouth at bedtime.     Yes [provider]    Physical Exam: Vitals:   11/21/16 0230 11/21/16 0245 11/21/16 0256 11/21/16 0300  BP: (!) 101/55 (!) 95/56 (!) 95/56 (!) 91/52  Pulse:   64   Resp: 19 19 20 19   Temp:      TempSrc:      SpO2: 93% 93% 93% 91%  Weight:      Height:          Constitutional: NAD, calm, comfortable Vitals:   11/21/16 0230 11/21/16 0245 11/21/16 0256 11/21/16 0300  BP: (!) 101/55 (!) 95/56 (!) 95/56 (!) 91/52  Pulse:   64   Resp: 19 19 20 19   Temp:      TempSrc:      SpO2: 93% 93% 93% 91%  Weight:      Height:       Eyes: PERRL, lids and conjunctivae normal ENMT: Mucous membranes are moist. Posterior pharynx clear of any exudate or lesions.Normal dentition.  Neck: normal, supple, no masses, no thyromegaly Respiratory: clear to auscultation bilaterally, no wheezing,  no crackles. Normal respiratory effort. No accessory muscle use.  Cardiovascular: Regular rate and rhythm, no murmurs / rubs / gallops. No extremity edema. 2+ pedal pulses. No carotid bruits.  Abdomen: no tenderness, no masses palpated. No hepatosplenomegaly. Bowel sounds positive.  Musculoskeletal: no clubbing / cyanosis. No joint deformity upper and lower extremities. Good ROM, no contractures. Normal muscle tone.  Skin: no rashes, lesions, ulcers. No induration Neurologic: CN 2-12 grossly intact. Sensation intact, DTR normal. Strength 5/5 in all 4.  Psychiatric: Normal judgment and insight. Alert and oriented x 3. Normal mood.   Labs on Admission: I have personally reviewed following labs and imaging studies CBC:  Recent Labs Lab 11/20/16 2310  WBC 9.4  NEUTROABS 6.0  HGB 14.1  HCT 39.6  MCV 82.8  PLT 518   Basic Metabolic Panel:  Recent Labs Lab 11/20/16 2310  NA 131*  K 3.3*  CL 96*  CO2 24  GLUCOSE 156*  BUN 24*  CREATININE 1.56*  CALCIUM 9.3   GFR: Estimated Creatinine Clearance: 32 mL/min (A) (by C-G formula based on SCr of 1.56 mg/dL (H)). Liver Function Tests:  Recent Labs Lab  11/20/16 2310  AST 30  ALT 32  ALKPHOS 58  BILITOT 0.6  PROT 7.0  ALBUMIN 3.7   Urine analysis:    Component Value Date/Time   COLORURINE YELLOW 02/21/2016 1153   APPEARANCEUR CLEAR 02/21/2016 1153   LABSPEC 1.009 02/21/2016 1153   PHURINE 7.0 02/21/2016 1153   GLUCOSEU NEGATIVE 02/21/2016 1153   HGBUR NEGATIVE 02/21/2016 1153   [place] 02/21/2016 1153   KETONESUR NEGATIVE 02/21/2016 1153   PROTEINUR NEGATIVE 02/21/2016 1153   UROBILINOGEN 0.2 08/16/2014 1958   NITRITE NEGATIVE 02/21/2016 1153   [place] 02/21/2016 1153    Recent Results (from the past 240 hour(s))  Blood Culture (routine x 2)     Status: None (Preliminary result)   Collection Time: 11/20/16 11:22 PM  Result Value Ref Range Status   Specimen Description RIGHT ANTECUBITAL  Final   Special Requests   Final    BOTTLES DRAWN AEROBIC AND ANAEROBIC Blood Culture adequate volume   Culture PENDING  Incomplete   Report Status PENDING  Incomplete  Blood Culture (routine x 2)     Status: None (Preliminary result)   Collection Time: 11/20/16 11:25 PM  Result Value Ref Range Status   Specimen Description BLOOD LEFT HAND  Final   Special Requests   Final    BOTTLES DRAWN AEROBIC AND ANAEROBIC Blood Culture adequate volume   Culture PENDING  Incomplete   Report Status PENDING  Incomplete     Radiological Exams on Admission: Ct Abdomen Pelvis W Contrast  Result Date: 11/21/2016 CLINICAL DATA:  Acute onset of upper abdominal pain, diarrhea and vomiting. Generalized weakness and lightheadedness. Weakness and fever. Initial encounter. EXAM: CT ABDOMEN AND PELVIS WITH CONTRAST TECHNIQUE: Multidetector CT imaging of the abdomen and pelvis was performed using the standard protocol following bolus administration of intravenous contrast. CONTRAST:  58mL ISOVUE-300 IOPAMIDOL (ISOVUE-300) INJECTION 61% COMPARISON:  CT of the abdomen and pelvis performed 10/25/2013 FINDINGS: Lower chest: The visualized  lung bases are grossly clear. The visualized portions of the mediastinum are unremarkable. Hepatobiliary: There is minimal prominence of the intrahepatic biliary ducts. The common bile duct measures up to 1.3 cm in diameter, concerning for distal obstruction. The gallbladder is grossly unremarkable. The liver is otherwise unremarkable in appearance. Pancreas: The pancreas is within normal limits. Spleen: A 1.4 cm nonspecific hypodensity is noted  at the anterior aspect of the spleen. Adrenals/Urinary Tract: The adrenal glands are unremarkable in appearance. The kidneys are within normal limits. There is no evidence of hydronephrosis. No renal or ureteral stones are identified. No perinephric stranding is seen. Stomach/Bowel: The appendix is not definitely seen. Mild wall thickening is noted along the cecum and ascending colon, concerning for infectious or inflammatory colitis. The remainder of the colon is grossly unremarkable. The small bowel is grossly unremarkable. The stomach is grossly unremarkable. Vascular/Lymphatic: Scattered calcification is seen along the abdominal aorta and its branches. The abdominal aorta is otherwise grossly unremarkable. The inferior vena cava is grossly unremarkable. No retroperitoneal lymphadenopathy is seen. No pelvic sidewall lymphadenopathy is identified. Reproductive: The bladder is decompressed and not well assessed. The uterus is unremarkable, aside from a calcified uterine fibroid. The ovaries are relatively symmetric. No suspicious adnexal masses are seen. Trace free fluid is noted within the pelvis. Other: No additional soft tissue abnormalities are seen. Musculoskeletal: No acute osseous abnormalities are identified. Sclerosis is noted at the pubic symphysis. Endplate sclerosis is noted at L4-L5, with multilevel vacuum phenomenon along the lumbar spine. The visualized musculature is unremarkable in appearance. IMPRESSION: 1. Mild wall thickening along the cecum and  ascending colon, concerning for infectious or inflammatory colitis. 2. Common bile duct measures up to 1.3 cm in diameter, raising concern for distal obstruction. Minimal prominence of the intrahepatic biliary ducts. Would correlate with LFTs, and consider MRCP or ERCP for further evaluation. 3. Scattered aortic atherosclerosis. 4. Calcified uterine fibroid noted. 5. 1.4 cm nonspecific hypodensity at the anterior aspect of the spleen has increased only mildly in size from 2015 and is likely benign. 6. Mild degenerative change along the lumbar spine. Electronically Signed   By: Garald Balding M.D.   On: 11/21/2016 00:39    EKG: Independently reviewed.  Assessment/Plan Principal Problem:   Hypotension (arterial) Active Problems:   Volume depletion   Diabetes mellitus   Hyperlipidemia   A-fib (HCC)   Diarrhea   PLAN:   Diarrhea:  Unclear if IBD or infectious.  Given her fever, will continue with IV Zosyn.  Stool will be sent.   Her last colonoscopy was many years ago, so if she is not better, should consider repeating it.  No evidence of biliary disease other a dilated CBD.  She has normal LFT and essentially no abdominal discomfort.  Will give IVF with KCL.  DM:  Will give some D5, and use SSI.   Hold Metformin for now post contrast.    Afib:  Will continue with Eliquis.  Her HR is controlled.  Due to hypotension, will hold BB.  Should resume as soon as her BP is good for her afib.  Hypotension:  Not septic.  Will continue IVF.  She had 4 L, so will reduce rate.   DVT prophylaxis:  Eliquis.  Code Status: FULL CODE.  Family Communication: Husband at bedside.  Disposition Plan: To home.  Consults called: None.  Admission status: OBS>    , MD FACP. [facility]  If 7PM-7AM, please contact night-coverage [email or web address] Password [email or web address]  11/21/2016, 3:20 AM

## 2016-11-21 NOTE — Progress Notes (Signed)
Subjective: This is an assumption of care note. She was admitted early this morning with colitis. This was seen on CT scan. I personally reviewed this scan. She was seen in my office 2 days ago with nausea vomiting and diarrhea. She had fever and it was felt that this was likely viral gastroenteritis and she was given symptomatic treatment. She is still having symptoms and although she says she was drinking fluids she has become hypotensive but I don't think she is septic. I don't think this is food poisoning because her husband did not get sick.  Objective: Vital signs in last 24 hours: Temp:  [98.6 F (37 C)-98.7 F (37.1 C)] 98.7 F (37.1 C) (05/24 0500) Pulse Rate:  [52-64] 52 (05/24 0700) Resp:  [12-20] 19 (05/24 0700) BP: (84-112)/(45-61) 99/58 (05/24 0700) SpO2:  [88 %-96 %] 96 % (05/24 0700) Weight:  [72.6 kg (160 lb)] 72.6 kg (160 lb) (05/23 2218) Weight change:  Last BM Date: 11/21/16  Intake/Output from previous day: 05/23 0701 - 05/24 0700 In: 1100 [I.V.:100; IV Piggyback:1000] Out: -   PHYSICAL EXAM General appearance: alert, cooperative and mild distress Resp: clear to auscultation bilaterally Cardio: regular rate and rhythm, S1, S2 normal, no murmur, click, rub or gallop GI: Minimal abdominal tenderness Extremities: extremities normal, atraumatic, no cyanosis or edema Mucous membranes are dry sterile  Lab Results:  Results for orders placed or performed during the hospital encounter of 11/20/16 (from the past 48 hour(s))  Comprehensive metabolic panel     Status: Abnormal   Collection Time: 11/20/16 11:10 PM  Result Value Ref Range   Sodium 131 (L) 135 - 145 mmol/L   Potassium 3.3 (L) 3.5 - 5.1 mmol/L   Chloride 96 (L) 101 - 111 mmol/L   CO2 24 22 - 32 mmol/L   Glucose, Bld 156 (H) 65 - 99 mg/dL   BUN 24 (H) 6 - 20 mg/dL   Creatinine, Ser 1.56 (H) 0.44 - 1.00 mg/dL   Calcium 9.3 8.9 - 10.3 mg/dL   Total Protein 7.0 6.5 - 8.1 g/dL   Albumin 3.7 3.5 - 5.0  g/dL   AST 30 15 - 41 U/L   ALT 32 14 - 54 U/L   Alkaline Phosphatase 58 38 - 126 U/L   Total Bilirubin 0.6 0.3 - 1.2 mg/dL   GFR calc non Af Amer 32 (L) >60 mL/min   GFR calc Af Amer 37 (L) >60 mL/min    Comment: (NOTE) The eGFR has been calculated using the CKD EPI equation. This calculation has not been validated in all clinical situations. eGFR's persistently <60 mL/min signify possible Chronic Kidney Disease.    Anion gap 11 5 - 15  CBC WITH DIFFERENTIAL     Status: Abnormal   Collection Time: 11/20/16 11:10 PM  Result Value Ref Range   WBC 9.4 4.0 - 10.5 K/uL   RBC 4.78 3.87 - 5.11 MIL/uL   Hemoglobin 14.1 12.0 - 15.0 g/dL   HCT 39.6 36.0 - 46.0 %   MCV 82.8 78.0 - 100.0 fL   MCH 29.5 26.0 - 34.0 pg   MCHC 35.6 30.0 - 36.0 g/dL   RDW 12.4 11.5 - 15.5 %   Platelets 210 150 - 400 K/uL   Neutrophils Relative % 64 %   Neutro Abs 6.0 1.7 - 7.7 K/uL   Lymphocytes Relative 15 %   Lymphs Abs 1.4 0.7 - 4.0 K/uL   Monocytes Relative 21 %   Monocytes Absolute 2.0 (H) 0.1 -  1.0 K/uL   Eosinophils Relative 0 %   Eosinophils Absolute 0.0 0.0 - 0.7 K/uL   Basophils Relative 0 %   Basophils Absolute 0.0 0.0 - 0.1 K/uL  Blood Culture (routine x 2)     Status: None (Preliminary result)   Collection Time: 11/20/16 11:22 PM  Result Value Ref Range   Specimen Description RIGHT ANTECUBITAL    Special Requests      BOTTLES DRAWN AEROBIC AND ANAEROBIC Blood Culture adequate volume   Culture NO GROWTH < 12 HOURS    Report Status PENDING   Lactic acid, plasma     Status: None   Collection Time: 11/20/16 11:22 PM  Result Value Ref Range   Lactic Acid, Venous 1.0 0.5 - 1.9 mmol/L  Blood Culture (routine x 2)     Status: None (Preliminary result)   Collection Time: 11/20/16 11:25 PM  Result Value Ref Range   Specimen Description BLOOD LEFT HAND    Special Requests      BOTTLES DRAWN AEROBIC AND ANAEROBIC Blood Culture adequate volume   Culture NO GROWTH < 12 HOURS    Report Status  PENDING   Lactic acid, plasma     Status: None   Collection Time: 11/21/16  2:07 AM  Result Value Ref Range   Lactic Acid, Venous 0.9 0.5 - 1.9 mmol/L  Urinalysis, Routine w reflex microscopic     Status: Abnormal   Collection Time: 11/21/16  3:00 AM  Result Value Ref Range   Color, Urine YELLOW YELLOW   APPearance CLEAR CLEAR   Specific Gravity, Urine 1.044 (H) 1.005 - 1.030   pH 5.0 5.0 - 8.0   Glucose, UA NEGATIVE NEGATIVE mg/dL   Hgb urine dipstick SMALL (A) NEGATIVE   Bilirubin Urine NEGATIVE NEGATIVE   Ketones, ur NEGATIVE NEGATIVE mg/dL   Protein, ur NEGATIVE NEGATIVE mg/dL   Nitrite NEGATIVE NEGATIVE   Leukocytes, UA NEGATIVE NEGATIVE   RBC / HPF 0-5 0 - 5 RBC/hpf   WBC, UA 0-5 0 - 5 WBC/hpf   Bacteria, UA RARE (A) NONE SEEN   Squamous Epithelial / LPF 0-5 (A) NONE SEEN   Mucous PRESENT    Hyaline Casts, UA PRESENT   MRSA PCR Screening     Status: None   Collection Time: 11/21/16  3:50 AM  Result Value Ref Range   MRSA by PCR NEGATIVE NEGATIVE    Comment:        The GeneXpert MRSA Assay (FDA approved for NASAL specimens only), is one component of a comprehensive MRSA colonization surveillance program. It is not intended to diagnose MRSA infection nor to guide or monitor treatment for MRSA infections.   TSH     Status: None   Collection Time: 11/21/16  4:30 AM  Result Value Ref Range   TSH 1.153 0.350 - 4.500 uIU/mL    Comment: Performed by a 3rd Generation assay with a functional sensitivity of <=0.01 uIU/mL.  Comprehensive metabolic panel     Status: Abnormal   Collection Time: 11/21/16  4:30 AM  Result Value Ref Range   Sodium 131 (L) 135 - 145 mmol/L   Potassium 3.1 (L) 3.5 - 5.1 mmol/L   Chloride 102 101 - 111 mmol/L   CO2 21 (L) 22 - 32 mmol/L   Glucose, Bld 129 (H) 65 - 99 mg/dL   BUN 22 (H) 6 - 20 mg/dL   Creatinine, Ser 1.15 (H) 0.44 - 1.00 mg/dL   Calcium 7.6 (L) 8.9 - 10.3 mg/dL  Total Protein 5.0 (L) 6.5 - 8.1 g/dL   Albumin 2.6 (L) 3.5 -  5.0 g/dL   AST 22 15 - 41 U/L   ALT 23 14 - 54 U/L   Alkaline Phosphatase 41 38 - 126 U/L   Total Bilirubin 0.5 0.3 - 1.2 mg/dL   GFR calc non Af Amer 46 (L) >60 mL/min   GFR calc Af Amer 53 (L) >60 mL/min    Comment: (NOTE) The eGFR has been calculated using the CKD EPI equation. This calculation has not been validated in all clinical situations. eGFR's persistently <60 mL/min signify possible Chronic Kidney Disease.    Anion gap 8 5 - 15  Glucose, capillary     Status: Abnormal   Collection Time: 11/21/16  5:57 AM  Result Value Ref Range   Glucose-Capillary 126 (H) 65 - 99 mg/dL    ABGS No results for input(s): PHART, PO2ART, TCO2, HCO3 in the last 72 hours.  Invalid input(s): PCO2 CULTURES Recent Results (from the past 240 hour(s))  Blood Culture (routine x 2)     Status: None (Preliminary result)   Collection Time: 11/20/16 11:22 PM  Result Value Ref Range Status   Specimen Description RIGHT ANTECUBITAL  Final   Special Requests   Final    BOTTLES DRAWN AEROBIC AND ANAEROBIC Blood Culture adequate volume   Culture NO GROWTH < 12 HOURS  Final   Report Status PENDING  Incomplete  Blood Culture (routine x 2)     Status: None (Preliminary result)   Collection Time: 11/20/16 11:25 PM  Result Value Ref Range Status   Specimen Description BLOOD LEFT HAND  Final   Special Requests   Final    BOTTLES DRAWN AEROBIC AND ANAEROBIC Blood Culture adequate volume   Culture NO GROWTH < 12 HOURS  Final   Report Status PENDING  Incomplete  MRSA PCR Screening     Status: None   Collection Time: 11/21/16  3:50 AM  Result Value Ref Range Status   MRSA by PCR NEGATIVE NEGATIVE Final    Comment:        The GeneXpert MRSA Assay (FDA approved for NASAL specimens only), is one component of a comprehensive MRSA colonization surveillance program. It is not intended to diagnose MRSA infection nor to guide or monitor treatment for MRSA infections.    Studies/Results: Ct Abdomen  Pelvis W Contrast  Result Date: 11/21/2016 CLINICAL DATA:  Acute onset of upper abdominal pain, diarrhea and vomiting. Generalized weakness and lightheadedness. Weakness and fever. Initial encounter. EXAM: CT ABDOMEN AND PELVIS WITH CONTRAST TECHNIQUE: Multidetector CT imaging of the abdomen and pelvis was performed using the standard protocol following bolus administration of intravenous contrast. CONTRAST:  9m ISOVUE-300 IOPAMIDOL (ISOVUE-300) INJECTION 61% COMPARISON:  CT of the abdomen and pelvis performed 10/25/2013 FINDINGS: Lower chest: The visualized lung bases are grossly clear. The visualized portions of the mediastinum are unremarkable. Hepatobiliary: There is minimal prominence of the intrahepatic biliary ducts. The common bile duct measures up to 1.3 cm in diameter, concerning for distal obstruction. The gallbladder is grossly unremarkable. The liver is otherwise unremarkable in appearance. Pancreas: The pancreas is within normal limits. Spleen: A 1.4 cm nonspecific hypodensity is noted at the anterior aspect of the spleen. Adrenals/Urinary Tract: The adrenal glands are unremarkable in appearance. The kidneys are within normal limits. There is no evidence of hydronephrosis. No renal or ureteral stones are identified. No perinephric stranding is seen. Stomach/Bowel: The appendix is not definitely seen. Mild wall thickening  is noted along the cecum and ascending colon, concerning for infectious or inflammatory colitis. The remainder of the colon is grossly unremarkable. The small bowel is grossly unremarkable. The stomach is grossly unremarkable. Vascular/Lymphatic: Scattered calcification is seen along the abdominal aorta and its branches. The abdominal aorta is otherwise grossly unremarkable. The inferior vena cava is grossly unremarkable. No retroperitoneal lymphadenopathy is seen. No pelvic sidewall lymphadenopathy is identified. Reproductive: The bladder is decompressed and not well assessed. The  uterus is unremarkable, aside from a calcified uterine fibroid. The ovaries are relatively symmetric. No suspicious adnexal masses are seen. Trace free fluid is noted within the pelvis. Other: No additional soft tissue abnormalities are seen. Musculoskeletal: No acute osseous abnormalities are identified. Sclerosis is noted at the pubic symphysis. Endplate sclerosis is noted at L4-L5, with multilevel vacuum phenomenon along the lumbar spine. The visualized musculature is unremarkable in appearance. IMPRESSION: 1. Mild wall thickening along the cecum and ascending colon, concerning for infectious or inflammatory colitis. 2. Common bile duct measures up to 1.3 cm in diameter, raising concern for distal obstruction. Minimal prominence of the intrahepatic biliary ducts. Would correlate with LFTs, and consider MRCP or ERCP for further evaluation. 3. Scattered aortic atherosclerosis. 4. Calcified uterine fibroid noted. 5. 1.4 cm nonspecific hypodensity at the anterior aspect of the spleen has increased only mildly in size from 2015 and is likely benign. 6. Mild degenerative change along the lumbar spine. Electronically Signed   By: Garald Balding M.D.   On: 11/21/2016 00:39    Medications:  Prior to Admission:  Prescriptions Prior to Admission  Medication Sig Dispense Refill Last Dose  . apixaban (ELIQUIS) 5 MG TABS tablet Take 1 tablet (5 mg total) by mouth 2 (two) times daily. 60 tablet 12 11/20/2016 at [place]  . esomeprazole (NEXIUM) 20 MG capsule Take 20 mg by mouth daily.    11/20/2016 at Unknown time  . hydrochlorothiazide 25 MG tablet Take 25 mg by mouth daily.     11/20/2016 at Unknown time  . HYDROcodone-acetaminophen (NORCO) 7.5-325 MG tablet Take 1-2 tablets by mouth every 4 (four) hours as needed for moderate pain or severe pain. 50 tablet 0 11/20/2016 at Unknown time  . lisinopril (PRINIVIL,ZESTRIL) 20 MG tablet Take 1 tablet (20 mg total) by mouth daily. 30 tablet 11 11/20/2016 at Unknown time  .  metFORMIN (GLUCOPHAGE) 500 MG tablet Take 1 tablet (500 mg total) by mouth daily.   11/20/2016 at Unknown time  . metoprolol succinate (TOPROL XL) 25 MG 24 hr tablet Take 0.5 tablets (12.5 mg total) by mouth daily. 45 tablet 3 11/20/2016 at 100a  . ondansetron (ZOFRAN-ODT) 8 MG disintegrating tablet PLACE 1 TABLET UNDER THE TONGUE AS NEEDED FOR NAUSEA AND VOMITING  12 11/19/2016 at Unknown time  . Potassium Gluconate 550 MG TABS Take 550 mg by mouth daily.    11/20/2016 at Unknown time  . simvastatin (ZOCOR) 40 MG tablet Take 40 mg by mouth at bedtime.     11/19/2016 at Unknown time   Scheduled: . apixaban  5 mg Oral BID  . insulin aspart  0-15 Units Subcutaneous Q4H  . pantoprazole  40 mg Oral Daily  . simvastatin  40 mg Oral QHS   Continuous: . dextrose 5 % and 0.9 % NaCl with KCl 20 mEq/L 50 mL (11/21/16 0440)   ZOX:[id number] **OR** acetaminophen, HYDROcodone-acetaminophen  Assesment:She was admitted with colitis. She is dehydrated. She was hypotensive. That is better now. She has paroxysmal atrial fibrillation currently is in  sinus rhythm. She was hypokalemic on admission. At baseline she has diabetes and hypertension. Principal Problem:   Hypotension (arterial) Active Problems:   Volume depletion   Diabetes mellitus   Hyperlipidemia   A-fib (HCC)   Diarrhea    Plan: Change her antibiotics to Cipro and Flagyl continue IV fluids    LOS: 0 days   , L 11/21/2016, 8:24 AM

## 2016-11-22 LAB — GLUCOSE, CAPILLARY
GLUCOSE-CAPILLARY: 100 mg/dL — AB (ref 65–99)
GLUCOSE-CAPILLARY: 104 mg/dL — AB (ref 65–99)
GLUCOSE-CAPILLARY: 140 mg/dL — AB (ref 65–99)
Glucose-Capillary: 108 mg/dL — ABNORMAL HIGH (ref 65–99)
Glucose-Capillary: 136 mg/dL — ABNORMAL HIGH (ref 65–99)
Glucose-Capillary: 134 mg/dL — ABNORMAL HIGH (ref 65–99)

## 2016-11-22 LAB — GASTROINTESTINAL PANEL BY PCR, STOOL (REPLACES STOOL CULTURE)
ADENOVIRUS F40/41: NOT DETECTED
ASTROVIRUS: NOT DETECTED
CAMPYLOBACTER SPECIES: DETECTED — AB
Cryptosporidium: NOT DETECTED
Cyclospora cayetanensis: NOT DETECTED
ENTEROAGGREGATIVE E COLI (EAEC): NOT DETECTED
Entamoeba histolytica: NOT DETECTED
Enteropathogenic E coli (EPEC): DETECTED — AB
Enterotoxigenic E coli (ETEC): NOT DETECTED
Giardia lamblia: NOT DETECTED
Norovirus GI/GII: NOT DETECTED
Plesimonas shigelloides: NOT DETECTED
ROTAVIRUS A: NOT DETECTED
SALMONELLA SPECIES: NOT DETECTED
SHIGA LIKE TOXIN PRODUCING E COLI (STEC): NOT DETECTED
Sapovirus (I, II, IV, and V): NOT DETECTED
Shigella/Enteroinvasive E coli (EIEC): NOT DETECTED
VIBRIO CHOLERAE: NOT DETECTED
Vibrio species: NOT DETECTED
YERSINIA ENTEROCOLITICA: NOT DETECTED

## 2016-11-22 LAB — BASIC METABOLIC PANEL
Anion gap: 6 (ref 5–15)
BUN: 7 mg/dL (ref 6–20)
CHLORIDE: 103 mmol/L (ref 101–111)
CO2: 23 mmol/L (ref 22–32)
CREATININE: 0.66 mg/dL (ref 0.44–1.00)
Calcium: 8.1 mg/dL — ABNORMAL LOW (ref 8.9–10.3)
GFR calc non Af Amer: >60 mL/min (ref >60)
Glucose, Bld: 115 mg/dL — ABNORMAL HIGH (ref 65–99)
Potassium: 2.8 mmol/L — ABNORMAL LOW (ref 3.5–5.1)
Sodium: 132 mmol/L — ABNORMAL LOW (ref 135–145)

## 2016-11-22 LAB — CBC WITH DIFFERENTIAL/PLATELET
Basophils Absolute: 0 10*3/uL (ref 0.0–0.1)
Basophils Relative: 0 %
EOS ABS: 0.2 10*3/uL (ref 0.0–0.7)
Eosinophils Relative: 3 %
HEMATOCRIT: 31 % — AB (ref 36.0–46.0)
HEMOGLOBIN: 11 g/dL — AB (ref 12.0–15.0)
LYMPHS ABS: 2.1 10*3/uL (ref 0.7–4.0)
Lymphocytes Relative: 33 %
MCH: 29.4 pg (ref 26.0–34.0)
MCHC: 35.5 g/dL (ref 30.0–36.0)
MCV: 82.9 fL (ref 78.0–100.0)
MONOS PCT: 19 %
Monocytes Absolute: 1.2 10*3/uL — ABNORMAL HIGH (ref 0.1–1.0)
Neutro Abs: 2.8 10*3/uL (ref 1.7–7.7)
Neutrophils Relative %: 44 %
Platelets: 162 10*3/uL (ref 150–400)
RBC: 3.74 MIL/uL — ABNORMAL LOW (ref 3.87–5.11)
RDW: 12.8 % (ref 11.5–15.5)
WBC: 6.3 10*3/uL (ref 4.0–10.5)

## 2016-11-22 LAB — MAGNESIUM: MAGNESIUM: 1.4 mg/dL — AB (ref 1.7–2.4)

## 2016-11-22 LAB — URINE CULTURE

## 2016-11-22 MED ORDER — POTASSIUM CHLORIDE CRYS ER 20 MEQ PO TBCR
40.0000 meq | EXTENDED_RELEASE_TABLET | Freq: Two times a day (BID) | ORAL | Status: DC
  Administered 2016-11-22 – 2016-11-26 (×8): 40 meq via ORAL
  Filled 2016-11-22 (×9): qty 2

## 2016-11-22 MED ORDER — LIP MEDEX EX OINT
TOPICAL_OINTMENT | CUTANEOUS | Status: DC | PRN
  Filled 2016-11-22: qty 7

## 2016-11-22 NOTE — Progress Notes (Addendum)
Subjective: She says she feels better. She is hungry. Her last episode of diarrhea was last night but she says that her diarrhea seems to get started because of eating and she hasn't eaten anything. She has no other new complaints.  Objective: Vital signs in last 24 hours: Temp:  [98.2 F (36.8 C)-98.5 F (36.9 C)] 98.2 F (36.8 C) (05/25 0630) Pulse Rate:  [51-63] 60 (05/25 0630) Resp:  [16-24] 20 (05/25 0630) BP: (101-123)/(54-75) 118/63 (05/25 0630) SpO2:  [94 %-97 %] 95 % (05/25 0630) Weight change:  Last BM Date: 11/21/16  Intake/Output from previous day: 05/24 0701 - 05/25 0700 In: 3000 [P.O.:1400; I.V.:900; IV Piggyback:700] Out: -   PHYSICAL EXAM General appearance: alert, cooperative and mild distress Resp: clear to auscultation bilaterally Cardio: regular rate and rhythm, S1, S2 normal, no murmur, click, rub or gallop GI: soft, non-tender; bowel sounds normal; no masses,  no organomegaly Extremities: extremities normal, atraumatic, no cyanosis or edema Skin warm and dry. Mucous membranes are moist  Lab Results:  Results for orders placed or performed during the hospital encounter of 11/20/16 (from the past 48 hour(s))  Comprehensive metabolic panel     Status: Abnormal   Collection Time: 11/20/16 11:10 PM  Result Value Ref Range   Sodium 131 (L) 135 - 145 mmol/L   Potassium 3.3 (L) 3.5 - 5.1 mmol/L   Chloride 96 (L) 101 - 111 mmol/L   CO2 24 22 - 32 mmol/L   Glucose, Bld 156 (H) 65 - 99 mg/dL   BUN 24 (H) 6 - 20 mg/dL   Creatinine, Ser 1.56 (H) 0.44 - 1.00 mg/dL   Calcium 9.3 8.9 - 10.3 mg/dL   Total Protein 7.0 6.5 - 8.1 g/dL   Albumin 3.7 3.5 - 5.0 g/dL   AST 30 15 - 41 U/L   ALT 32 14 - 54 U/L   Alkaline Phosphatase 58 38 - 126 U/L   Total Bilirubin 0.6 0.3 - 1.2 mg/dL   GFR calc non Af Amer 32 (L) >60 mL/min   GFR calc Af Amer 37 (L) >60 mL/min    Comment: (NOTE) The eGFR has been calculated using the CKD EPI equation. This calculation has not been  validated in all clinical situations. eGFR's persistently <60 mL/min signify possible Chronic Kidney Disease.    Anion gap 11 5 - 15  CBC WITH DIFFERENTIAL     Status: Abnormal   Collection Time: 11/20/16 11:10 PM  Result Value Ref Range   WBC 9.4 4.0 - 10.5 K/uL   RBC 4.78 3.87 - 5.11 MIL/uL   Hemoglobin 14.1 12.0 - 15.0 g/dL   HCT 39.6 36.0 - 46.0 %   MCV 82.8 78.0 - 100.0 fL   MCH 29.5 26.0 - 34.0 pg   MCHC 35.6 30.0 - 36.0 g/dL   RDW 12.4 11.5 - 15.5 %   Platelets 210 150 - 400 K/uL   Neutrophils Relative % 64 %   Neutro Abs 6.0 1.7 - 7.7 K/uL   Lymphocytes Relative 15 %   Lymphs Abs 1.4 0.7 - 4.0 K/uL   Monocytes Relative 21 %   Monocytes Absolute 2.0 (H) 0.1 - 1.0 K/uL   Eosinophils Relative 0 %   Eosinophils Absolute 0.0 0.0 - 0.7 K/uL   Basophils Relative 0 %   Basophils Absolute 0.0 0.0 - 0.1 K/uL  Blood Culture (routine x 2)     Status: None (Preliminary result)   Collection Time: 11/20/16 11:22 PM  Result Value Ref  Range   Specimen Description RIGHT ANTECUBITAL    Special Requests      BOTTLES DRAWN AEROBIC AND ANAEROBIC Blood Culture adequate volume   Culture NO GROWTH < 12 HOURS    Report Status PENDING   Lactic acid, plasma     Status: None   Collection Time: 11/20/16 11:22 PM  Result Value Ref Range   Lactic Acid, Venous 1.0 0.5 - 1.9 mmol/L  Blood Culture (routine x 2)     Status: None (Preliminary result)   Collection Time: 11/20/16 11:25 PM  Result Value Ref Range   Specimen Description BLOOD LEFT HAND    Special Requests      BOTTLES DRAWN AEROBIC AND ANAEROBIC Blood Culture adequate volume   Culture NO GROWTH < 12 HOURS    Report Status PENDING   Lactic acid, plasma     Status: None   Collection Time: 11/21/16  2:07 AM  Result Value Ref Range   Lactic Acid, Venous 0.9 0.5 - 1.9 mmol/L  Urinalysis, Routine w reflex microscopic     Status: Abnormal   Collection Time: 11/21/16  3:00 AM  Result Value Ref Range   Color, Urine YELLOW YELLOW    APPearance CLEAR CLEAR   Specific Gravity, Urine 1.044 (H) 1.005 - 1.030   pH 5.0 5.0 - 8.0   Glucose, UA NEGATIVE NEGATIVE mg/dL   Hgb urine dipstick SMALL (A) NEGATIVE   Bilirubin Urine NEGATIVE NEGATIVE   Ketones, ur NEGATIVE NEGATIVE mg/dL   Protein, ur NEGATIVE NEGATIVE mg/dL   Nitrite NEGATIVE NEGATIVE   Leukocytes, UA NEGATIVE NEGATIVE   RBC / HPF 0-5 0 - 5 RBC/hpf   WBC, UA 0-5 0 - 5 WBC/hpf   Bacteria, UA RARE (A) NONE SEEN   Squamous Epithelial / LPF 0-5 (A) NONE SEEN   Mucous PRESENT    Hyaline Casts, UA PRESENT   MRSA PCR Screening     Status: None   Collection Time: 11/21/16  3:50 AM  Result Value Ref Range   MRSA by PCR NEGATIVE NEGATIVE    Comment:        The GeneXpert MRSA Assay (FDA approved for NASAL specimens only), is one component of a comprehensive MRSA colonization surveillance program. It is not intended to diagnose MRSA infection nor to guide or monitor treatment for MRSA infections.   TSH     Status: None   Collection Time: 11/21/16  4:30 AM  Result Value Ref Range   TSH 1.153 0.350 - 4.500 uIU/mL    Comment: Performed by a 3rd Generation assay with a functional sensitivity of <=0.01 uIU/mL.  Comprehensive metabolic panel     Status: Abnormal   Collection Time: 11/21/16  4:30 AM  Result Value Ref Range   Sodium 131 (L) 135 - 145 mmol/L   Potassium 3.1 (L) 3.5 - 5.1 mmol/L   Chloride 102 101 - 111 mmol/L   CO2 21 (L) 22 - 32 mmol/L   Glucose, Bld 129 (H) 65 - 99 mg/dL   BUN 22 (H) 6 - 20 mg/dL   Creatinine, Ser 1.15 (H) 0.44 - 1.00 mg/dL   Calcium 7.6 (L) 8.9 - 10.3 mg/dL   Total Protein 5.0 (L) 6.5 - 8.1 g/dL   Albumin 2.6 (L) 3.5 - 5.0 g/dL   AST 22 15 - 41 U/L   ALT 23 14 - 54 U/L   Alkaline Phosphatase 41 38 - 126 U/L   Total Bilirubin 0.5 0.3 - 1.2 mg/dL   GFR calc  non Af Amer 46 (L) >60 mL/min   GFR calc Af Amer 53 (L) >60 mL/min    Comment: (NOTE) The eGFR has been calculated using the CKD EPI equation. This calculation has not  been validated in all clinical situations. eGFR's persistently <60 mL/min signify possible Chronic Kidney Disease.    Anion gap 8 5 - 15  Glucose, capillary     Status: Abnormal   Collection Time: 11/21/16  5:57 AM  Result Value Ref Range   Glucose-Capillary 126 (H) 65 - 99 mg/dL  Glucose, capillary     Status: Abnormal   Collection Time: 11/21/16  8:23 AM  Result Value Ref Range   Glucose-Capillary 138 (H) 65 - 99 mg/dL  Glucose, capillary     Status: Abnormal   Collection Time: 11/21/16 12:22 PM  Result Value Ref Range   Glucose-Capillary 212 (H) 65 - 99 mg/dL  Glucose, capillary     Status: None   Collection Time: 11/21/16  4:18 PM  Result Value Ref Range   Glucose-Capillary 80 65 - 99 mg/dL  Glucose, capillary     Status: Abnormal   Collection Time: 11/21/16  9:04 PM  Result Value Ref Range   Glucose-Capillary 179 (H) 65 - 99 mg/dL  Glucose, capillary     Status: Abnormal   Collection Time: 11/22/16 12:45 AM  Result Value Ref Range   Glucose-Capillary 104 (H) 65 - 99 mg/dL   Comment 1 Notify RN    Comment 2 Document in Chart   Glucose, capillary     Status: Abnormal   Collection Time: 11/22/16  4:20 AM  Result Value Ref Range   Glucose-Capillary 108 (H) 65 - 99 mg/dL   Comment 1 Notify RN    Comment 2 Document in Chart   Basic metabolic panel     Status: Abnormal   Collection Time: 11/22/16  5:36 AM  Result Value Ref Range   Sodium 132 (L) 135 - 145 mmol/L   Potassium 2.8 (L) 3.5 - 5.1 mmol/L   Chloride 103 101 - 111 mmol/L   CO2 23 22 - 32 mmol/L   Glucose, Bld 115 (H) 65 - 99 mg/dL   BUN 7 6 - 20 mg/dL   Creatinine, Ser 0.66 0.44 - 1.00 mg/dL   Calcium 8.1 (L) 8.9 - 10.3 mg/dL   GFR calc non Af Amer >60 >60 mL/min   GFR calc Af Amer >60 >60 mL/min    Comment: (NOTE) The eGFR has been calculated using the CKD EPI equation. This calculation has not been validated in all clinical situations. eGFR's persistently <60 mL/min signify possible Chronic  Kidney Disease.    Anion gap 6 5 - 15  CBC with Differential/Platelet     Status: Abnormal   Collection Time: 11/22/16  5:36 AM  Result Value Ref Range   WBC 6.3 4.0 - 10.5 K/uL   RBC 3.74 (L) 3.87 - 5.11 MIL/uL   Hemoglobin 11.0 (L) 12.0 - 15.0 g/dL    Comment: DELTA CHECK NOTED   HCT 31.0 (L) 36.0 - 46.0 %   MCV 82.9 78.0 - 100.0 fL   MCH 29.4 26.0 - 34.0 pg   MCHC 35.5 30.0 - 36.0 g/dL   RDW 12.8 11.5 - 15.5 %   Platelets 162 150 - 400 K/uL   Neutrophils Relative % 44 %   Neutro Abs 2.8 1.7 - 7.7 K/uL   Lymphocytes Relative 33 %   Lymphs Abs 2.1 0.7 - 4.0 K/uL   Monocytes  Relative 19 %   Monocytes Absolute 1.2 (H) 0.1 - 1.0 K/uL   Eosinophils Relative 3 %   Eosinophils Absolute 0.2 0.0 - 0.7 K/uL   Basophils Relative 0 %   Basophils Absolute 0.0 0.0 - 0.1 K/uL  Glucose, capillary     Status: Abnormal   Collection Time: 11/22/16  8:02 AM  Result Value Ref Range   Glucose-Capillary 100 (H) 65 - 99 mg/dL   Comment 1 Notify RN     ABGS No results for input(s): PHART, PO2ART, TCO2, HCO3 in the last 72 hours.  Invalid input(s): PCO2 CULTURES Recent Results (from the past 240 hour(s))  Blood Culture (routine x 2)     Status: None (Preliminary result)   Collection Time: 11/20/16 11:22 PM  Result Value Ref Range Status   Specimen Description RIGHT ANTECUBITAL  Final   Special Requests   Final    BOTTLES DRAWN AEROBIC AND ANAEROBIC Blood Culture adequate volume   Culture NO GROWTH < 12 HOURS  Final   Report Status PENDING  Incomplete  Blood Culture (routine x 2)     Status: None (Preliminary result)   Collection Time: 11/20/16 11:25 PM  Result Value Ref Range Status   Specimen Description BLOOD LEFT HAND  Final   Special Requests   Final    BOTTLES DRAWN AEROBIC AND ANAEROBIC Blood Culture adequate volume   Culture NO GROWTH < 12 HOURS  Final   Report Status PENDING  Incomplete  MRSA PCR Screening     Status: None   Collection Time: 11/21/16  3:50 AM  Result Value  Ref Range Status   MRSA by PCR NEGATIVE NEGATIVE Final    Comment:        The GeneXpert MRSA Assay (FDA approved for NASAL specimens only), is one component of a comprehensive MRSA colonization surveillance program. It is not intended to diagnose MRSA infection nor to guide or monitor treatment for MRSA infections.    Studies/Results: Ct Abdomen Pelvis W Contrast  Result Date: 11/21/2016 CLINICAL DATA:  Acute onset of upper abdominal pain, diarrhea and vomiting. Generalized weakness and lightheadedness. Weakness and fever. Initial encounter. EXAM: CT ABDOMEN AND PELVIS WITH CONTRAST TECHNIQUE: Multidetector CT imaging of the abdomen and pelvis was performed using the standard protocol following bolus administration of intravenous contrast. CONTRAST:  8m ISOVUE-300 IOPAMIDOL (ISOVUE-300) INJECTION 61% COMPARISON:  CT of the abdomen and pelvis performed 10/25/2013 FINDINGS: Lower chest: The visualized lung bases are grossly clear. The visualized portions of the mediastinum are unremarkable. Hepatobiliary: There is minimal prominence of the intrahepatic biliary ducts. The common bile duct measures up to 1.3 cm in diameter, concerning for distal obstruction. The gallbladder is grossly unremarkable. The liver is otherwise unremarkable in appearance. Pancreas: The pancreas is within normal limits. Spleen: A 1.4 cm nonspecific hypodensity is noted at the anterior aspect of the spleen. Adrenals/Urinary Tract: The adrenal glands are unremarkable in appearance. The kidneys are within normal limits. There is no evidence of hydronephrosis. No renal or ureteral stones are identified. No perinephric stranding is seen. Stomach/Bowel: The appendix is not definitely seen. Mild wall thickening is noted along the cecum and ascending colon, concerning for infectious or inflammatory colitis. The remainder of the colon is grossly unremarkable. The small bowel is grossly unremarkable. The stomach is grossly  unremarkable. Vascular/Lymphatic: Scattered calcification is seen along the abdominal aorta and its branches. The abdominal aorta is otherwise grossly unremarkable. The inferior vena cava is grossly unremarkable. No retroperitoneal lymphadenopathy is seen. No  pelvic sidewall lymphadenopathy is identified. Reproductive: The bladder is decompressed and not well assessed. The uterus is unremarkable, aside from a calcified uterine fibroid. The ovaries are relatively symmetric. No suspicious adnexal masses are seen. Trace free fluid is noted within the pelvis. Other: No additional soft tissue abnormalities are seen. Musculoskeletal: No acute osseous abnormalities are identified. Sclerosis is noted at the pubic symphysis. Endplate sclerosis is noted at L4-L5, with multilevel vacuum phenomenon along the lumbar spine. The visualized musculature is unremarkable in appearance. IMPRESSION: 1. Mild wall thickening along the cecum and ascending colon, concerning for infectious or inflammatory colitis. 2. Common bile duct measures up to 1.3 cm in diameter, raising concern for distal obstruction. Minimal prominence of the intrahepatic biliary ducts. Would correlate with LFTs, and consider MRCP or ERCP for further evaluation. 3. Scattered aortic atherosclerosis. 4. Calcified uterine fibroid noted. 5. 1.4 cm nonspecific hypodensity at the anterior aspect of the spleen has increased only mildly in size from 2015 and is likely benign. 6. Mild degenerative change along the lumbar spine. Electronically Signed   By: Garald Balding M.D.   On: 11/21/2016 00:39    Medications:  Prior to Admission:  Prescriptions Prior to Admission  Medication Sig Dispense Refill Last Dose  . apixaban (ELIQUIS) 5 MG TABS tablet Take 1 tablet (5 mg total) by mouth 2 (two) times daily. 60 tablet 12 11/20/2016 at [place]  . esomeprazole (NEXIUM) 20 MG capsule Take 20 mg by mouth daily.    11/20/2016 at Unknown time  . hydrochlorothiazide 25 MG tablet Take  25 mg by mouth daily.     11/20/2016 at Unknown time  . HYDROcodone-acetaminophen (NORCO) 7.5-325 MG tablet Take 1-2 tablets by mouth every 4 (four) hours as needed for moderate pain or severe pain. 50 tablet 0 11/20/2016 at Unknown time  . lisinopril (PRINIVIL,ZESTRIL) 20 MG tablet Take 1 tablet (20 mg total) by mouth daily. 30 tablet 11 11/20/2016 at Unknown time  . metFORMIN (GLUCOPHAGE) 500 MG tablet Take 1 tablet (500 mg total) by mouth daily.   11/20/2016 at Unknown time  . metoprolol succinate (TOPROL XL) 25 MG 24 hr tablet Take 0.5 tablets (12.5 mg total) by mouth daily. 45 tablet 3 11/20/2016 at 100a  . ondansetron (ZOFRAN-ODT) 8 MG disintegrating tablet PLACE 1 TABLET UNDER THE TONGUE AS NEEDED FOR NAUSEA AND VOMITING  12 11/19/2016 at Unknown time  . Potassium Gluconate 550 MG TABS Take 550 mg by mouth daily.    11/20/2016 at Unknown time  . simvastatin (ZOCOR) 40 MG tablet Take 40 mg by mouth at bedtime.     11/19/2016 at Unknown time   Scheduled: . apixaban  5 mg Oral BID  . insulin aspart  0-15 Units Subcutaneous Q4H  . pantoprazole  40 mg Oral Daily  . simvastatin  40 mg Oral QHS   Continuous: . ciprofloxacin Stopped (11/21/16 2101)  . dextrose 5 % and 0.9 % NaCl with KCl 20 mEq/L 50 mL/hr at 11/22/16 0443  . metronidazole Stopped (11/22/16 5784)   ONG:[id number] **OR** acetaminophen, HYDROcodone-acetaminophen, ondansetron (ZOFRAN) IV **OR** ondansetron  Assesment: She was admitted with colitis. When I saw her in the office it appeared that this was viral but she is now being treated with Cipro and Flagyl. She seems to have improved. GI pathogen panel is pending. She wants to eat. She was dehydrated on admission and had hypotension on admission but that is better. Principal Problem:   Colitis Active Problems:   Essential hypertension, benign  Paroxysmal atrial fibrillation (HCC)   Type 2 diabetes mellitus (HCC)   Hypotension (arterial)   Volume depletion   Diabetes  mellitus   Hyperlipidemia   A-fib (HCC)   Diarrhea   Acute kidney injury ([place])   Hypokalemia    Plan: Continue treatments. See how she does with liquids this morning and if that doesn't precipitate more diarrhea I will advance her diet. Continue Cipro and Flagyl.Her potassium is low. I will replace orally since her diarrhea seems to have slowed down. Check magnesium level    LOS: 1 day   , L 11/22/2016, 9:02 AM

## 2016-11-22 NOTE — Progress Notes (Signed)
Informed by Erika Lee, (Lab, [place]), that patient's GI pathogen pcr resulted. Results are as follows: detecting campylobacter as low as enter pathogenic ecoli. Dr Luan Pulling has been verbally informed of results. No further instructions at this time.

## 2016-11-23 LAB — GLUCOSE, CAPILLARY
GLUCOSE-CAPILLARY: 139 mg/dL — AB (ref 65–99)
GLUCOSE-CAPILLARY: 129 mg/dL — AB (ref 65–99)
GLUCOSE-CAPILLARY: 123 mg/dL — AB (ref 65–99)
GLUCOSE-CAPILLARY: 132 mg/dL — AB (ref 65–99)

## 2016-11-23 LAB — BASIC METABOLIC PANEL
ANION GAP: 5 (ref 5–15)
BUN: <5 mg/dL — ABNORMAL LOW (ref 6–20)
CHLORIDE: 106 mmol/L (ref 101–111)
CO2: 25 mmol/L (ref 22–32)
Calcium: 8.4 mg/dL — ABNORMAL LOW (ref 8.9–10.3)
Creatinine, Ser: 0.57 mg/dL (ref 0.44–1.00)
GFR calc non Af Amer: >60 mL/min (ref >60)
Glucose, Bld: 142 mg/dL — ABNORMAL HIGH (ref 65–99)
POTASSIUM: 3.8 mmol/L (ref 3.5–5.1)
SODIUM: 136 mmol/L (ref 135–145)

## 2016-11-23 MED ORDER — BLISTEX MEDICATED EX OINT
TOPICAL_OINTMENT | CUTANEOUS | Status: DC | PRN
  Filled 2016-11-23: qty 6.3

## 2016-11-23 MED ORDER — SALINE SPRAY 0.65 % NA SOLN: 1.0000 | NASAL | Status: DC | PRN

## 2016-11-23 MED ORDER — CIPROFLOXACIN HCL 250 MG PO TABS
500.0000 mg | ORAL_TABLET | Freq: Two times a day (BID) | ORAL | Status: DC
  Administered 2016-11-23 – 2016-11-26 (×7): 500 mg via ORAL
  Filled 2016-11-23 (×7): qty 2

## 2016-11-23 MED ORDER — FLUTICASONE PROPIONATE 50 MCG/ACT NA SUSP
2.0000 | Freq: Every day | NASAL | Status: DC
  Administered 2016-11-23 – 2016-11-26 (×4): 2 via NASAL
  Filled 2016-11-23: qty 16

## 2016-11-23 NOTE — Progress Notes (Signed)
Subjective:  She feels better. She's having much less diarrhea. She's hungry. Her GI pathogen reported Campylobacter.  Objective: Vital signs in last 24 hours: Temp:  [98.6 F (37 C)-98.9 F (37.2 C)] 98.9 F (37.2 C) (05/25 2110) Pulse Rate:  [54-61] 54 (05/25 2110) Resp:  [16-20] 20 (05/25 2110) BP: (130-150)/(61-68) 150/61 (05/25 2110) SpO2:  [97 %-99 %] 99 % (05/25 2110) Weight change:  Last BM Date: 11/22/16  Intake/Output from previous day: 05/25 0701 - 05/26 0700 In: 6433 [P.O.:720; I.V.:750; IV Piggyback:300] Out: -   PHYSICAL EXAM General appearance: alert, cooperative and mild distress Resp: clear to auscultation bilaterally Cardio: regular rate and rhythm, S1, S2 normal, no murmur, click, rub or gallop GI: Minimal abdominal pain Extremities: extremities normal, atraumatic, no cyanosis or edema Mucous membranes are moist  Lab Results:  Results for orders placed or performed during the hospital encounter of 11/20/16 (from the past 48 hour(s))  Glucose, capillary     Status: Abnormal   Collection Time: 11/21/16 12:22 PM  Result Value Ref Range   Glucose-Capillary 212 (H) 65 - 99 mg/dL  Glucose, capillary     Status: None   Collection Time: 11/21/16  4:18 PM  Result Value Ref Range   Glucose-Capillary 80 65 - 99 mg/dL  Glucose, capillary     Status: Abnormal   Collection Time: 11/21/16  9:04 PM  Result Value Ref Range   Glucose-Capillary 179 (H) 65 - 99 mg/dL  Gastrointestinal Panel by PCR , Stool     Status: Abnormal   Collection Time: 11/21/16 10:50 PM  Result Value Ref Range   Campylobacter species DETECTED (A) NOT DETECTED    Comment: RESULT CALLED TO, READ BACK BY AND VERIFIED WITH: LISA BULLINS AT 2951 ON 11/22/2016 JJB    Plesimonas shigelloides NOT DETECTED NOT DETECTED   Salmonella species NOT DETECTED NOT DETECTED   Yersinia enterocolitica NOT DETECTED NOT DETECTED   Vibrio species NOT DETECTED NOT DETECTED   Vibrio cholerae NOT DETECTED NOT  DETECTED   Enteroaggregative E coli (EAEC) NOT DETECTED NOT DETECTED   Enteropathogenic E coli (EPEC) DETECTED (A) NOT DETECTED    Comment: RESULT CALLED TO, READ BACK BY AND VERIFIED WITH: LISA BULLINS AT 1735 ON 11/22/2016 JJB    Enterotoxigenic E coli (ETEC) NOT DETECTED NOT DETECTED   Shiga like toxin producing E coli (STEC) NOT DETECTED NOT DETECTED   Shigella/Enteroinvasive E coli (EIEC) NOT DETECTED NOT DETECTED   Cryptosporidium NOT DETECTED NOT DETECTED   Cyclospora cayetanensis NOT DETECTED NOT DETECTED   Entamoeba histolytica NOT DETECTED NOT DETECTED   Giardia lamblia NOT DETECTED NOT DETECTED   Adenovirus F40/41 NOT DETECTED NOT DETECTED   Astrovirus NOT DETECTED NOT DETECTED   Norovirus GI/GII NOT DETECTED NOT DETECTED   Rotavirus A NOT DETECTED NOT DETECTED   Sapovirus (I, II, IV, and V) NOT DETECTED NOT DETECTED  Glucose, capillary     Status: Abnormal   Collection Time: 11/22/16 12:45 AM  Result Value Ref Range   Glucose-Capillary 104 (H) 65 - 99 mg/dL   Comment 1 Notify RN    Comment 2 Document in Chart   Glucose, capillary     Status: Abnormal   Collection Time: 11/22/16  4:20 AM  Result Value Ref Range   Glucose-Capillary 108 (H) 65 - 99 mg/dL   Comment 1 Notify RN    Comment 2 Document in Chart   Basic metabolic panel     Status: Abnormal   Collection Time: 11/22/16  5:36 AM  Result Value Ref Range   Sodium 132 (L) 135 - 145 mmol/L   Potassium 2.8 (L) 3.5 - 5.1 mmol/L   Chloride 103 101 - 111 mmol/L   CO2 23 22 - 32 mmol/L   Glucose, Bld 115 (H) 65 - 99 mg/dL   BUN 7 6 - 20 mg/dL   Creatinine, Ser 0.66 0.44 - 1.00 mg/dL   Calcium 8.1 (L) 8.9 - 10.3 mg/dL   GFR calc non Af Amer >60 >60 mL/min   GFR calc Af Amer >60 >60 mL/min    Comment: (NOTE) The eGFR has been calculated using the CKD EPI equation. This calculation has not been validated in all clinical situations. eGFR's persistently <60 mL/min signify possible Chronic Kidney Disease.    Anion  gap 6 5 - 15  CBC with Differential/Platelet     Status: Abnormal   Collection Time: 11/22/16  5:36 AM  Result Value Ref Range   WBC 6.3 4.0 - 10.5 K/uL   RBC 3.74 (L) 3.87 - 5.11 MIL/uL   Hemoglobin 11.0 (L) 12.0 - 15.0 g/dL    Comment: DELTA CHECK NOTED   HCT 31.0 (L) 36.0 - 46.0 %   MCV 82.9 78.0 - 100.0 fL   MCH 29.4 26.0 - 34.0 pg   MCHC 35.5 30.0 - 36.0 g/dL   RDW 12.8 11.5 - 15.5 %   Platelets 162 150 - 400 K/uL   Neutrophils Relative % 44 %   Neutro Abs 2.8 1.7 - 7.7 K/uL   Lymphocytes Relative 33 %   Lymphs Abs 2.1 0.7 - 4.0 K/uL   Monocytes Relative 19 %   Monocytes Absolute 1.2 (H) 0.1 - 1.0 K/uL   Eosinophils Relative 3 %   Eosinophils Absolute 0.2 0.0 - 0.7 K/uL   Basophils Relative 0 %   Basophils Absolute 0.0 0.0 - 0.1 K/uL  Magnesium     Status: Abnormal   Collection Time: 11/22/16  5:36 AM  Result Value Ref Range   Magnesium 1.4 (L) 1.7 - 2.4 mg/dL  Glucose, capillary     Status: Abnormal   Collection Time: 11/22/16  8:02 AM  Result Value Ref Range   Glucose-Capillary 100 (H) 65 - 99 mg/dL   Comment 1 Notify RN   Glucose, capillary     Status: Abnormal   Collection Time: 11/22/16 11:05 AM  Result Value Ref Range   Glucose-Capillary 140 (H) 65 - 99 mg/dL  Glucose, capillary     Status: Abnormal   Collection Time: 11/22/16  4:15 PM  Result Value Ref Range   Glucose-Capillary 136 (H) 65 - 99 mg/dL  Glucose, capillary     Status: Abnormal   Collection Time: 11/22/16  9:05 PM  Result Value Ref Range   Glucose-Capillary 134 (H) 65 - 99 mg/dL   Comment 1 Notify RN    Comment 2 Document in Chart   Basic metabolic panel     Status: Abnormal   Collection Time: 11/23/16  6:42 AM  Result Value Ref Range   Sodium 136 135 - 145 mmol/L   Potassium 3.8 3.5 - 5.1 mmol/L    Comment: DELTA CHECK NOTED   Chloride 106 101 - 111 mmol/L   CO2 25 22 - 32 mmol/L   Glucose, Bld 142 (H) 65 - 99 mg/dL   BUN <5 (L) 6 - 20 mg/dL   Creatinine, Ser 0.57 0.44 - 1.00 mg/dL    Calcium 8.4 (L) 8.9 - 10.3 mg/dL   GFR  calc non Af Amer >60 >60 mL/min   GFR calc Af Amer >60 >60 mL/min    Comment: (NOTE) The eGFR has been calculated using the CKD EPI equation. This calculation has not been validated in all clinical situations. eGFR's persistently <60 mL/min signify possible Chronic Kidney Disease.    Anion gap 5 5 - 15  Glucose, capillary     Status: Abnormal   Collection Time: 11/23/16  6:52 AM  Result Value Ref Range   Glucose-Capillary 139 (H) 65 - 99 mg/dL  Glucose, capillary     Status: Abnormal   Collection Time: 11/23/16  8:00 AM  Result Value Ref Range   Glucose-Capillary 129 (H) 65 - 99 mg/dL   Comment 1 Notify RN     ABGS No results for input(s): PHART, PO2ART, TCO2, HCO3 in the last 72 hours.  Invalid input(s): PCO2 CULTURES Recent Results (from the past 240 hour(s))  Blood Culture (routine x 2)     Status: None (Preliminary result)   Collection Time: 11/20/16 11:22 PM  Result Value Ref Range Status   Specimen Description RIGHT ANTECUBITAL  Final   Special Requests   Final    BOTTLES DRAWN AEROBIC AND ANAEROBIC Blood Culture adequate volume   Culture NO GROWTH 2 DAYS  Final   Report Status PENDING  Incomplete  Blood Culture (routine x 2)     Status: None (Preliminary result)   Collection Time: 11/20/16 11:25 PM  Result Value Ref Range Status   Specimen Description BLOOD LEFT HAND  Final   Special Requests   Final    BOTTLES DRAWN AEROBIC AND ANAEROBIC Blood Culture adequate volume   Culture NO GROWTH 2 DAYS  Final   Report Status PENDING  Incomplete  Urine culture     Status: Abnormal   Collection Time: 11/21/16  3:00 AM  Result Value Ref Range Status   Specimen Description URINE, CLEAN CATCH  Final   Special Requests NONE  Final   Culture MULTIPLE SPECIES PRESENT, SUGGEST RECOLLECTION (A)  Final   Report Status 11/22/2016 FINAL  Final  MRSA PCR Screening     Status: None   Collection Time: 11/21/16  3:50 AM  Result Value Ref Range  Status   MRSA by PCR NEGATIVE NEGATIVE Final    Comment:        The GeneXpert MRSA Assay (FDA approved for NASAL specimens only), is one component of a comprehensive MRSA colonization surveillance program. It is not intended to diagnose MRSA infection nor to guide or monitor treatment for MRSA infections.   Gastrointestinal Panel by PCR , Stool     Status: Abnormal   Collection Time: 11/21/16 10:50 PM  Result Value Ref Range Status   Campylobacter species DETECTED (A) NOT DETECTED Final    Comment: RESULT CALLED TO, READ BACK BY AND VERIFIED WITH: LISA BULLINS AT 1735 ON 11/22/2016 JJB    Plesimonas shigelloides NOT DETECTED NOT DETECTED Final   Salmonella species NOT DETECTED NOT DETECTED Final   Yersinia enterocolitica NOT DETECTED NOT DETECTED Final   Vibrio species NOT DETECTED NOT DETECTED Final   Vibrio cholerae NOT DETECTED NOT DETECTED Final   Enteroaggregative E coli (EAEC) NOT DETECTED NOT DETECTED Final   Enteropathogenic E coli (EPEC) DETECTED (A) NOT DETECTED Final    Comment: RESULT CALLED TO, READ BACK BY AND VERIFIED WITH: LISA BULLINS AT 1735 ON 11/22/2016 JJB    Enterotoxigenic E coli (ETEC) NOT DETECTED NOT DETECTED Final   Shiga like toxin producing E  coli (STEC) NOT DETECTED NOT DETECTED Final   Shigella/Enteroinvasive E coli (EIEC) NOT DETECTED NOT DETECTED Final   Cryptosporidium NOT DETECTED NOT DETECTED Final   Cyclospora cayetanensis NOT DETECTED NOT DETECTED Final   Entamoeba histolytica NOT DETECTED NOT DETECTED Final   Giardia lamblia NOT DETECTED NOT DETECTED Final   Adenovirus F40/41 NOT DETECTED NOT DETECTED Final   Astrovirus NOT DETECTED NOT DETECTED Final   Norovirus GI/GII NOT DETECTED NOT DETECTED Final   Rotavirus A NOT DETECTED NOT DETECTED Final   Sapovirus (I, II, IV, and V) NOT DETECTED NOT DETECTED Final   Studies/Results: No results found.  Medications:  Prior to Admission:  Prescriptions Prior to Admission  Medication Sig  Dispense Refill Last Dose  . apixaban (ELIQUIS) 5 MG TABS tablet Take 1 tablet (5 mg total) by mouth 2 (two) times daily. 60 tablet 12 11/20/2016 at [place]  . esomeprazole (NEXIUM) 20 MG capsule Take 20 mg by mouth daily.    11/20/2016 at Unknown time  . hydrochlorothiazide 25 MG tablet Take 25 mg by mouth daily.     11/20/2016 at Unknown time  . HYDROcodone-acetaminophen (NORCO) 7.5-325 MG tablet Take 1-2 tablets by mouth every 4 (four) hours as needed for moderate pain or severe pain. 50 tablet 0 11/20/2016 at Unknown time  . lisinopril (PRINIVIL,ZESTRIL) 20 MG tablet Take 1 tablet (20 mg total) by mouth daily. 30 tablet 11 11/20/2016 at Unknown time  . metFORMIN (GLUCOPHAGE) 500 MG tablet Take 1 tablet (500 mg total) by mouth daily.   11/20/2016 at Unknown time  . metoprolol succinate (TOPROL XL) 25 MG 24 hr tablet Take 0.5 tablets (12.5 mg total) by mouth daily. 45 tablet 3 11/20/2016 at 100a  . ondansetron (ZOFRAN-ODT) 8 MG disintegrating tablet PLACE 1 TABLET UNDER THE TONGUE AS NEEDED FOR NAUSEA AND VOMITING  12 11/19/2016 at Unknown time  . Potassium Gluconate 550 MG TABS Take 550 mg by mouth daily.    11/20/2016 at Unknown time  . simvastatin (ZOCOR) 40 MG tablet Take 40 mg by mouth at bedtime.     11/19/2016 at Unknown time   Scheduled: . apixaban  5 mg Oral BID  . ciprofloxacin  500 mg Oral BID  . fluticasone  2 spray Each Nare Daily  . insulin aspart  0-15 Units Subcutaneous Q4H  . pantoprazole  40 mg Oral Daily  . potassium chloride  40 mEq Oral BID  . simvastatin  40 mg Oral QHS   Continuous:  ONG:[id number] **OR** acetaminophen, HYDROcodone-acetaminophen, lip balm, ondansetron (ZOFRAN) IV **OR** ondansetron, sodium chloride  Assesment: She was admitted with colitis and now we know is from Campylobacter. I will discontinue Flagyl. Continue Cipro but switch her to by mouth since she is substantially improved. She was dehydrated on admission and that is better. She is still  weak. Principal Problem:   Colitis Active Problems:   Essential hypertension, benign   Paroxysmal atrial fibrillation (HCC)   Type 2 diabetes mellitus (HCC)   Hypotension (arterial)   Volume depletion   Diabetes mellitus   Hyperlipidemia   A-fib (HCC)   Diarrhea   Acute kidney injury ([place])   Hypokalemia    Plan: Switch to by mouth Cipro probable discharge tomorrow    LOS: 2 days   , L 11/23/2016, 10:12 AM

## 2016-11-24 LAB — GLUCOSE, CAPILLARY
GLUCOSE-CAPILLARY: 133 mg/dL — AB (ref 65–99)
GLUCOSE-CAPILLARY: 213 mg/dL — AB (ref 65–99)
GLUCOSE-CAPILLARY: 133 mg/dL — AB (ref 65–99)
Glucose-Capillary: 112 mg/dL — ABNORMAL HIGH (ref 65–99)
Glucose-Capillary: 116 mg/dL — ABNORMAL HIGH (ref 65–99)

## 2016-11-24 MED ORDER — BOOST / RESOURCE BREEZE PO LIQD
1.0000 | Freq: Three times a day (TID) | ORAL | Status: DC
  Administered 2016-11-24: 1 via ORAL

## 2016-11-24 NOTE — Progress Notes (Signed)
Subjective: She says she's having more abdominal discomfort and diarrhea. I advanced her diet yesterday. She has no other new complaints. She has been up and moving around.  Objective: Vital signs in last 24 hours: Temp:  [98.3 F (36.8 C)-98.6 F (37 C)] 98.4 F (36.9 C) (05/27 0630) Pulse Rate:  [52-56] 52 (05/27 0630) Resp:  [14-20] 14 (05/27 0630) BP: (110-160)/(66-86) 110/66 (05/27 0630) SpO2:  [97 %-99 %] 97 % (05/27 0630) Weight change:  Last BM Date: 11/23/16  Intake/Output from previous day: 05/26 0701 - 05/27 0700 In: 1746.7 [P.O.:720; I.V.:1026.7] Out: -   PHYSICAL EXAM General appearance: alert, cooperative and mild distress Resp: clear to auscultation bilaterally Cardio: regular rate and rhythm, S1, S2 normal, no murmur, click, rub or gallop GI: She is tender in the upper quadrants bilaterally Extremities: extremities normal, atraumatic, no cyanosis or edema Skin warm and dry mucous membranes are moist  Lab Results:  Results for orders placed or performed during the hospital encounter of 11/20/16 (from the past 48 hour(s))  Glucose, capillary     Status: Abnormal   Collection Time: 11/22/16 11:05 AM  Result Value Ref Range   Glucose-Capillary 140 (H) 65 - 99 mg/dL  Glucose, capillary     Status: Abnormal   Collection Time: 11/22/16  4:15 PM  Result Value Ref Range   Glucose-Capillary 136 (H) 65 - 99 mg/dL  Glucose, capillary     Status: Abnormal   Collection Time: 11/22/16  9:05 PM  Result Value Ref Range   Glucose-Capillary 134 (H) 65 - 99 mg/dL   Comment 1 Notify RN    Comment 2 Document in Chart   Basic metabolic panel     Status: Abnormal   Collection Time: 11/23/16  6:42 AM  Result Value Ref Range   Sodium 136 135 - 145 mmol/L   Potassium 3.8 3.5 - 5.1 mmol/L    Comment: DELTA CHECK NOTED   Chloride 106 101 - 111 mmol/L   CO2 25 22 - 32 mmol/L   Glucose, Bld 142 (H) 65 - 99 mg/dL   BUN <5 (L) 6 - 20 mg/dL   Creatinine, Ser 0.57 0.44 - 1.00  mg/dL   Calcium 8.4 (L) 8.9 - 10.3 mg/dL   GFR calc non Af Amer >60 >60 mL/min   GFR calc Af Amer >60 >60 mL/min    Comment: (NOTE) The eGFR has been calculated using the CKD EPI equation. This calculation has not been validated in all clinical situations. eGFR's persistently <60 mL/min signify possible Chronic Kidney Disease.    Anion gap 5 5 - 15  Glucose, capillary     Status: Abnormal   Collection Time: 11/23/16  6:52 AM  Result Value Ref Range   Glucose-Capillary 139 (H) 65 - 99 mg/dL  Glucose, capillary     Status: Abnormal   Collection Time: 11/23/16  8:00 AM  Result Value Ref Range   Glucose-Capillary 129 (H) 65 - 99 mg/dL   Comment 1 Notify RN   Glucose, capillary     Status: Abnormal   Collection Time: 11/23/16  4:51 PM  Result Value Ref Range   Glucose-Capillary 123 (H) 65 - 99 mg/dL   Comment 1 Notify RN   Glucose, capillary     Status: Abnormal   Collection Time: 11/23/16  8:48 PM  Result Value Ref Range   Glucose-Capillary 132 (H) 65 - 99 mg/dL   Comment 1 Notify RN    Comment 2 Document in Chart  Glucose, capillary     Status: Abnormal   Collection Time: 11/24/16 12:19 AM  Result Value Ref Range   Glucose-Capillary 112 (H) 65 - 99 mg/dL   Comment 1 Notify RN    Comment 2 Document in Chart   Glucose, capillary     Status: Abnormal   Collection Time: 11/24/16  8:08 AM  Result Value Ref Range   Glucose-Capillary 116 (H) 65 - 99 mg/dL   Comment 1 Notify RN    Comment 2 Document in Chart     ABGS No results for input(s): PHART, PO2ART, TCO2, HCO3 in the last 72 hours.  Invalid input(s): PCO2 CULTURES Recent Results (from the past 240 hour(s))  Blood Culture (routine x 2)     Status: None (Preliminary result)   Collection Time: 11/20/16 11:22 PM  Result Value Ref Range Status   Specimen Description RIGHT ANTECUBITAL  Final   Special Requests   Final    BOTTLES DRAWN AEROBIC AND ANAEROBIC Blood Culture adequate volume   Culture NO GROWTH 4 DAYS  Final    Report Status PENDING  Incomplete  Blood Culture (routine x 2)     Status: None (Preliminary result)   Collection Time: 11/20/16 11:25 PM  Result Value Ref Range Status   Specimen Description BLOOD LEFT HAND  Final   Special Requests   Final    BOTTLES DRAWN AEROBIC AND ANAEROBIC Blood Culture adequate volume   Culture NO GROWTH 4 DAYS  Final   Report Status PENDING  Incomplete  Urine culture     Status: Abnormal   Collection Time: 11/21/16  3:00 AM  Result Value Ref Range Status   Specimen Description URINE, CLEAN CATCH  Final   Special Requests NONE  Final   Culture MULTIPLE SPECIES PRESENT, SUGGEST RECOLLECTION (A)  Final   Report Status 11/22/2016 FINAL  Final  MRSA PCR Screening     Status: None   Collection Time: 11/21/16  3:50 AM  Result Value Ref Range Status   MRSA by PCR NEGATIVE NEGATIVE Final    Comment:        The GeneXpert MRSA Assay (FDA approved for NASAL specimens only), is one component of a comprehensive MRSA colonization surveillance program. It is not intended to diagnose MRSA infection nor to guide or monitor treatment for MRSA infections.   Gastrointestinal Panel by PCR , Stool     Status: Abnormal   Collection Time: 11/21/16 10:50 PM  Result Value Ref Range Status   Campylobacter species DETECTED (A) NOT DETECTED Final    Comment: RESULT CALLED TO, READ BACK BY AND VERIFIED WITH: LISA BULLINS AT 1735 ON 11/22/2016 JJB    Plesimonas shigelloides NOT DETECTED NOT DETECTED Final   Salmonella species NOT DETECTED NOT DETECTED Final   Yersinia enterocolitica NOT DETECTED NOT DETECTED Final   Vibrio species NOT DETECTED NOT DETECTED Final   Vibrio cholerae NOT DETECTED NOT DETECTED Final   Enteroaggregative E coli (EAEC) NOT DETECTED NOT DETECTED Final   Enteropathogenic E coli (EPEC) DETECTED (A) NOT DETECTED Final    Comment: RESULT CALLED TO, READ BACK BY AND VERIFIED WITH: LISA BULLINS AT 1735 ON 11/22/2016 JJB    Enterotoxigenic E coli (ETEC)  NOT DETECTED NOT DETECTED Final   Shiga like toxin producing E coli (STEC) NOT DETECTED NOT DETECTED Final   Shigella/Enteroinvasive E coli (EIEC) NOT DETECTED NOT DETECTED Final   Cryptosporidium NOT DETECTED NOT DETECTED Final   Cyclospora cayetanensis NOT DETECTED NOT DETECTED Final  Entamoeba histolytica NOT DETECTED NOT DETECTED Final   Giardia lamblia NOT DETECTED NOT DETECTED Final   Adenovirus F40/41 NOT DETECTED NOT DETECTED Final   Astrovirus NOT DETECTED NOT DETECTED Final   Norovirus GI/GII NOT DETECTED NOT DETECTED Final   Rotavirus A NOT DETECTED NOT DETECTED Final   Sapovirus (I, II, IV, and V) NOT DETECTED NOT DETECTED Final   Studies/Results: No results found.  Medications:  Prior to Admission:  Prescriptions Prior to Admission  Medication Sig Dispense Refill Last Dose  . apixaban (ELIQUIS) 5 MG TABS tablet Take 1 tablet (5 mg total) by mouth 2 (two) times daily. 60 tablet 12 11/20/2016 at [place]  . esomeprazole (NEXIUM) 20 MG capsule Take 20 mg by mouth daily.    11/20/2016 at Unknown time  . hydrochlorothiazide 25 MG tablet Take 25 mg by mouth daily.     11/20/2016 at Unknown time  . HYDROcodone-acetaminophen (NORCO) 7.5-325 MG tablet Take 1-2 tablets by mouth every 4 (four) hours as needed for moderate pain or severe pain. 50 tablet 0 11/20/2016 at Unknown time  . lisinopril (PRINIVIL,ZESTRIL) 20 MG tablet Take 1 tablet (20 mg total) by mouth daily. 30 tablet 11 11/20/2016 at Unknown time  . metFORMIN (GLUCOPHAGE) 500 MG tablet Take 1 tablet (500 mg total) by mouth daily.   11/20/2016 at Unknown time  . metoprolol succinate (TOPROL XL) 25 MG 24 hr tablet Take 0.5 tablets (12.5 mg total) by mouth daily. 45 tablet 3 11/20/2016 at [facility]  . ondansetron (ZOFRAN-ODT) 8 MG disintegrating tablet PLACE 1 TABLET UNDER THE TONGUE AS NEEDED FOR NAUSEA AND VOMITING  12 11/19/2016 at Unknown time  . Potassium Gluconate 550 MG TABS Take 550 mg by mouth daily.    11/20/2016 at Unknown time  .  simvastatin (ZOCOR) 40 MG tablet Take 40 mg by mouth at bedtime.     11/19/2016 at Unknown time   Scheduled: . apixaban  5 mg Oral BID  . ciprofloxacin  500 mg Oral BID  . feeding supplement  1 Container Oral TID BM  . fluticasone  2 spray Each Nare Daily  . insulin aspart  0-15 Units Subcutaneous Q4H  . pantoprazole  40 mg Oral Daily  . potassium chloride  40 mEq Oral BID  . simvastatin  40 mg Oral QHS   Continuous:  CMK:[id number] **OR** acetaminophen, HYDROcodone-acetaminophen, lip balm, ondansetron (ZOFRAN) IV **OR** ondansetron, sodium chloride  Assesment: She has colitis from Campylobacter. She is on appropriate treatment for that and I had switched her to oral meds and advanced her diet yesterday. She's having more trouble today so on going to back off on her diet. Her with by mouth Cipro. Principal Problem:   Colitis Active Problems:   Essential hypertension, benign   Paroxysmal atrial fibrillation (HCC)   Type 2 diabetes mellitus (HCC)   Hypotension (arterial)   Volume depletion   Diabetes mellitus   Hyperlipidemia   A-fib (HCC)   Diarrhea   Acute kidney injury ([place])   Hypokalemia    Plan: Go back to full liquid diet. Continue by mouth Cipro. No other changes in medications.    LOS: 3 days   , L 11/24/2016, 10:16 AM

## 2016-11-25 LAB — GLUCOSE, CAPILLARY
GLUCOSE-CAPILLARY: 133 mg/dL — AB (ref 65–99)
GLUCOSE-CAPILLARY: 189 mg/dL — AB (ref 65–99)
Glucose-Capillary: 124 mg/dL — ABNORMAL HIGH (ref 65–99)
Glucose-Capillary: 144 mg/dL — ABNORMAL HIGH (ref 65–99)
Glucose-Capillary: 149 mg/dL — ABNORMAL HIGH (ref 65–99)
Glucose-Capillary: 93 mg/dL (ref 65–99)

## 2016-11-25 LAB — CULTURE, BLOOD (ROUTINE X 2)
Culture: NO GROWTH
Culture: NO GROWTH
SPECIAL REQUESTS: ADEQUATE
Special Requests: ADEQUATE

## 2016-11-25 MED ORDER — GLUCERNA SHAKE PO LIQD: 237.0000 mL | Freq: Three times a day (TID) | ORAL | Status: DC

## 2016-11-25 NOTE — Progress Notes (Signed)
Subjective: She says she still has abdominal pain and diarrhea. Her blood sugar has been up some. She has no other new complaints. Her breathing is okay. No chest pain  Objective: Vital signs in last 24 hours: Temp:  [98.1 F (36.7 C)-98.4 F (36.9 C)] 98.1 F (36.7 C) (05/28 0655) Pulse Rate:  [54-58] 54 (05/28 0655) Resp:  [16-20] 17 (05/28 0655) BP: (141-175)/(63-73) 161/63 (05/28 0655) SpO2:  [98 %-100 %] 100 % (05/28 0655) Weight change:  Last BM Date: 11/25/16  Intake/Output from previous day: 05/27 0701 - 05/28 0700 In: 720 [P.O.:720] Out: -   PHYSICAL EXAM General appearance: alert, cooperative and mild distress Resp: clear to auscultation bilaterally Cardio: regular rate and rhythm, S1, S2 normal, no murmur, click, rub or gallop GI: soft, non-tender; bowel sounds normal; no masses,  no organomegaly Extremities: extremities normal, atraumatic, no cyanosis or edema Skin warm and dry  Lab Results:  Results for orders placed or performed during the hospital encounter of 11/20/16 (from the past 48 hour(s))  Glucose, capillary     Status: Abnormal   Collection Time: 11/23/16  4:51 PM  Result Value Ref Range   Glucose-Capillary 123 (H) 65 - 99 mg/dL   Comment 1 Notify RN   Glucose, capillary     Status: Abnormal   Collection Time: 11/23/16  8:48 PM  Result Value Ref Range   Glucose-Capillary 132 (H) 65 - 99 mg/dL   Comment 1 Notify RN    Comment 2 Document in Chart   Glucose, capillary     Status: Abnormal   Collection Time: 11/24/16 12:19 AM  Result Value Ref Range   Glucose-Capillary 112 (H) 65 - 99 mg/dL   Comment 1 Notify RN    Comment 2 Document in Chart   Glucose, capillary     Status: Abnormal   Collection Time: 11/24/16  8:08 AM  Result Value Ref Range   Glucose-Capillary 116 (H) 65 - 99 mg/dL   Comment 1 Notify RN    Comment 2 Document in Chart   Glucose, capillary     Status: Abnormal   Collection Time: 11/24/16 11:31 AM  Result Value Ref Range    Glucose-Capillary 133 (H) 65 - 99 mg/dL   Comment 1 Notify RN    Comment 2 Document in Chart   Glucose, capillary     Status: Abnormal   Collection Time: 11/24/16  4:17 PM  Result Value Ref Range   Glucose-Capillary 213 (H) 65 - 99 mg/dL  Glucose, capillary     Status: Abnormal   Collection Time: 11/24/16  8:32 PM  Result Value Ref Range   Glucose-Capillary 133 (H) 65 - 99 mg/dL  Glucose, capillary     Status: Abnormal   Collection Time: 11/25/16 12:13 AM  Result Value Ref Range   Glucose-Capillary 133 (H) 65 - 99 mg/dL   Comment 1 Notify RN    Comment 2 Document in Chart   Glucose, capillary     Status: Abnormal   Collection Time: 11/25/16  7:14 AM  Result Value Ref Range   Glucose-Capillary 124 (H) 65 - 99 mg/dL    ABGS No results for input(s): PHART, PO2ART, TCO2, HCO3 in the last 72 hours.  Invalid input(s): PCO2 CULTURES Recent Results (from the past 240 hour(s))  Blood Culture (routine x 2)     Status: None   Collection Time: 11/20/16 11:22 PM  Result Value Ref Range Status   Specimen Description RIGHT ANTECUBITAL  Final   Special  Requests   Final    BOTTLES DRAWN AEROBIC AND ANAEROBIC Blood Culture adequate volume   Culture NO GROWTH 5 DAYS  Final   Report Status 11/25/2016 FINAL  Final  Blood Culture (routine x 2)     Status: None   Collection Time: 11/20/16 11:25 PM  Result Value Ref Range Status   Specimen Description BLOOD LEFT HAND  Final   Special Requests   Final    BOTTLES DRAWN AEROBIC AND ANAEROBIC Blood Culture adequate volume   Culture NO GROWTH 5 DAYS  Final   Report Status 11/25/2016 FINAL  Final  Urine culture     Status: Abnormal   Collection Time: 11/21/16  3:00 AM  Result Value Ref Range Status   Specimen Description URINE, CLEAN CATCH  Final   Special Requests NONE  Final   Culture MULTIPLE SPECIES PRESENT, SUGGEST RECOLLECTION (A)  Final   Report Status 11/22/2016 FINAL  Final  MRSA PCR Screening     Status: None   Collection Time:  11/21/16  3:50 AM  Result Value Ref Range Status   MRSA by PCR NEGATIVE NEGATIVE Final    Comment:        The GeneXpert MRSA Assay (FDA approved for NASAL specimens only), is one component of a comprehensive MRSA colonization surveillance program. It is not intended to diagnose MRSA infection nor to guide or monitor treatment for MRSA infections.   Gastrointestinal Panel by PCR , Stool     Status: Abnormal   Collection Time: 11/21/16 10:50 PM  Result Value Ref Range Status   Campylobacter species DETECTED (A) NOT DETECTED Final    Comment: RESULT CALLED TO, READ BACK BY AND VERIFIED WITH: LISA BULLINS AT 1735 ON 11/22/2016 JJB    Plesimonas shigelloides NOT DETECTED NOT DETECTED Final   Salmonella species NOT DETECTED NOT DETECTED Final   Yersinia enterocolitica NOT DETECTED NOT DETECTED Final   Vibrio species NOT DETECTED NOT DETECTED Final   Vibrio cholerae NOT DETECTED NOT DETECTED Final   Enteroaggregative E coli (EAEC) NOT DETECTED NOT DETECTED Final   Enteropathogenic E coli (EPEC) DETECTED (A) NOT DETECTED Final    Comment: RESULT CALLED TO, READ BACK BY AND VERIFIED WITH: LISA BULLINS AT 1735 ON 11/22/2016 JJB    Enterotoxigenic E coli (ETEC) NOT DETECTED NOT DETECTED Final   Shiga like toxin producing E coli (STEC) NOT DETECTED NOT DETECTED Final   Shigella/Enteroinvasive E coli (EIEC) NOT DETECTED NOT DETECTED Final   Cryptosporidium NOT DETECTED NOT DETECTED Final   Cyclospora cayetanensis NOT DETECTED NOT DETECTED Final   Entamoeba histolytica NOT DETECTED NOT DETECTED Final   Giardia lamblia NOT DETECTED NOT DETECTED Final   Adenovirus F40/41 NOT DETECTED NOT DETECTED Final   Astrovirus NOT DETECTED NOT DETECTED Final   Norovirus GI/GII NOT DETECTED NOT DETECTED Final   Rotavirus A NOT DETECTED NOT DETECTED Final   Sapovirus (I, II, IV, and V) NOT DETECTED NOT DETECTED Final   Studies/Results: No results found.  Medications:  Prior to Admission:   Prescriptions Prior to Admission  Medication Sig Dispense Refill Last Dose  . apixaban (ELIQUIS) 5 MG TABS tablet Take 1 tablet (5 mg total) by mouth 2 (two) times daily. 60 tablet 12 11/20/2016 at [facility]  . esomeprazole (NEXIUM) 20 MG capsule Take 20 mg by mouth daily.    11/20/2016 at Unknown time  . hydrochlorothiazide 25 MG tablet Take 25 mg by mouth daily.     11/20/2016 at Unknown time  . HYDROcodone-acetaminophen (  NORCO) 7.5-325 MG tablet Take 1-2 tablets by mouth every 4 (four) hours as needed for moderate pain or severe pain. 50 tablet 0 11/20/2016 at Unknown time  . lisinopril (PRINIVIL,ZESTRIL) 20 MG tablet Take 1 tablet (20 mg total) by mouth daily. 30 tablet 11 11/20/2016 at Unknown time  . metFORMIN (GLUCOPHAGE) 500 MG tablet Take 1 tablet (500 mg total) by mouth daily.   11/20/2016 at Unknown time  . metoprolol succinate (TOPROL XL) 25 MG 24 hr tablet Take 0.5 tablets (12.5 mg total) by mouth daily. 45 tablet 3 11/20/2016 at 100a  . ondansetron (ZOFRAN-ODT) 8 MG disintegrating tablet PLACE 1 TABLET UNDER THE TONGUE AS NEEDED FOR NAUSEA AND VOMITING  12 11/19/2016 at Unknown time  . Potassium Gluconate 550 MG TABS Take 550 mg by mouth daily.    11/20/2016 at Unknown time  . simvastatin (ZOCOR) 40 MG tablet Take 40 mg by mouth at bedtime.     11/19/2016 at Unknown time   Scheduled: . apixaban  5 mg Oral BID  . ciprofloxacin  500 mg Oral BID  . feeding supplement  1 Container Oral TID BM  . fluticasone  2 spray Each Nare Daily  . insulin aspart  0-15 Units Subcutaneous Q4H  . pantoprazole  40 mg Oral Daily  . potassium chloride  40 mEq Oral BID  . simvastatin  40 mg Oral QHS   Continuous:  OIB:[id number] **OR** acetaminophen, HYDROcodone-acetaminophen, lip balm, ondansetron (ZOFRAN) IV **OR** ondansetron, sodium chloride  Assesment: She was admitted with colitis due to Campylobacter. She's been treated. Her blood sugar was up and I think it's because of boost. She is still  complaining of symptoms and I think it's too early to send her home for fear of her coming right back. She has multiple other medical problems. She was dehydrated but that's better. She has paroxysmal atrial fibrillation she is chronically anticoagulated. She has diabetes and her blood sugar is up a little bit. She had acute kidney injury which is better Principal Problem:   Colitis Active Problems:   Essential hypertension, benign   Paroxysmal atrial fibrillation (HCC)   Type 2 diabetes mellitus (HCC)   Hypotension (arterial)   Volume depletion   Diabetes mellitus   Hyperlipidemia   A-fib (HCC)   Diarrhea   Acute kidney injury ([place])   Hypokalemia    Plan: Continue current treatments    LOS: 4 days   , L 11/25/2016, 10:15 AM

## 2016-11-26 ENCOUNTER — Ambulatory Visit (INDEPENDENT_AMBULATORY_CARE_PROVIDER_SITE_OTHER): Payer: PPO | Admitting: *Deleted

## 2016-11-26 DIAGNOSIS — I4891 Unspecified atrial fibrillation: Secondary | ICD-10-CM

## 2016-11-26 LAB — GLUCOSE, CAPILLARY
GLUCOSE-CAPILLARY: 130 mg/dL — AB (ref 65–99)
GLUCOSE-CAPILLARY: 172 mg/dL — AB (ref 65–99)
GLUCOSE-CAPILLARY: 145 mg/dL — AB (ref 65–99)
Glucose-Capillary: 134 mg/dL — ABNORMAL HIGH (ref 65–99)
Glucose-Capillary: 120 mg/dL — ABNORMAL HIGH (ref 65–99)

## 2016-11-26 MED ORDER — CIPROFLOXACIN HCL 500 MG PO TABS: 500.0000 mg | ORAL_TABLET | Freq: Two times a day (BID) | ORAL | 0 refills | Status: DC

## 2016-11-26 NOTE — Care Management Important Message (Signed)
Important Message  Patient Details  Name: Erika Lee MRN: [id number] Date of Birth: 24-Sep-1945   Medicare Important Message Given:  Yes    Sherald Barge, RN 11/26/2016, 4:02 PM

## 2016-11-26 NOTE — Progress Notes (Signed)
Carelink Summary Report / Loop Recorder 

## 2016-11-26 NOTE — Discharge Summary (Signed)
Physician Discharge Summary  Patient ID: Erika Lee MRN: [id number] DOB/AGE: Nov 02, 1945 71 y.o. Primary Care Physician:, Percell Miller, MD Admit date: 11/20/2016 Discharge date: 11/26/2016    Discharge Diagnoses:   Principal Problem:   Colitis Active Problems:   Essential hypertension, benign   Paroxysmal atrial fibrillation (HCC)   Type 2 diabetes mellitus (HCC)   Hypotension (arterial)   Volume depletion   Diabetes mellitus   Hyperlipidemia   A-fib (HCC)   Diarrhea   Acute kidney injury ([place])   Hypokalemia  Colitis due to Campylobacter Allergies as of 11/26/2016      Reactions   Clindamycin/lincomycin Rash   Doxycycline Other (See Comments)   Makes heart race   Keflex [cephalexin] Nausea And Vomiting   Adhesive [tape] Rash   Latex Rash   Sulfonamide Derivatives Nausea And Vomiting      Medication List    TAKE these medications   apixaban 5 MG Tabs tablet Commonly known as:  ELIQUIS Take 1 tablet (5 mg total) by mouth 2 (two) times daily.   ciprofloxacin 500 MG tablet Commonly known as:  CIPRO Take 1 tablet (500 mg total) by mouth 2 (two) times daily.   esomeprazole 20 MG capsule Commonly known as:  NEXIUM Take 20 mg by mouth daily.   hydrochlorothiazide 25 MG tablet Commonly known as:  HYDRODIURIL Take 25 mg by mouth daily.   HYDROcodone-acetaminophen 7.5-325 MG tablet Commonly known as:  NORCO Take 1-2 tablets by mouth every 4 (four) hours as needed for moderate pain or severe pain.   lisinopril 20 MG tablet Commonly known as:  PRINIVIL,ZESTRIL Take 1 tablet (20 mg total) by mouth daily.   metFORMIN 500 MG tablet Commonly known as:  GLUCOPHAGE Take 1 tablet (500 mg total) by mouth daily.   metoprolol succinate 25 MG 24 hr tablet Commonly known as:  TOPROL XL Take 0.5 tablets (12.5 mg total) by mouth daily.   ondansetron 8 MG disintegrating tablet Commonly known as:  ZOFRAN-ODT PLACE 1 TABLET UNDER THE TONGUE AS NEEDED FOR NAUSEA AND  VOMITING   Potassium Gluconate 550 MG Tabs Take 550 mg by mouth daily.   simvastatin 40 MG tablet Commonly known as:  ZOCOR Take 40 mg by mouth at bedtime.       Discharged Condition:Improved    Consults: None  Significant Diagnostic Studies: Ct Abdomen Pelvis W Contrast  Result Date: 11/21/2016 CLINICAL DATA:  Acute onset of upper abdominal pain, diarrhea and vomiting. Generalized weakness and lightheadedness. Weakness and fever. Initial encounter. EXAM: CT ABDOMEN AND PELVIS WITH CONTRAST TECHNIQUE: Multidetector CT imaging of the abdomen and pelvis was performed using the standard protocol following bolus administration of intravenous contrast. CONTRAST:  68mL ISOVUE-300 IOPAMIDOL (ISOVUE-300) INJECTION 61% COMPARISON:  CT of the abdomen and pelvis performed 10/25/2013 FINDINGS: Lower chest: The visualized lung bases are grossly clear. The visualized portions of the mediastinum are unremarkable. Hepatobiliary: There is minimal prominence of the intrahepatic biliary ducts. The common bile duct measures up to 1.3 cm in diameter, concerning for distal obstruction. The gallbladder is grossly unremarkable. The liver is otherwise unremarkable in appearance. Pancreas: The pancreas is within normal limits. Spleen: A 1.4 cm nonspecific hypodensity is noted at the anterior aspect of the spleen. Adrenals/Urinary Tract: The adrenal glands are unremarkable in appearance. The kidneys are within normal limits. There is no evidence of hydronephrosis. No renal or ureteral stones are identified. No perinephric stranding is seen. Stomach/Bowel: The appendix is not definitely seen. Mild wall thickening is noted along  the cecum and ascending colon, concerning for infectious or inflammatory colitis. The remainder of the colon is grossly unremarkable. The small bowel is grossly unremarkable. The stomach is grossly unremarkable. Vascular/Lymphatic: Scattered calcification is seen along the abdominal aorta and its  branches. The abdominal aorta is otherwise grossly unremarkable. The inferior vena cava is grossly unremarkable. No retroperitoneal lymphadenopathy is seen. No pelvic sidewall lymphadenopathy is identified. Reproductive: The bladder is decompressed and not well assessed. The uterus is unremarkable, aside from a calcified uterine fibroid. The ovaries are relatively symmetric. No suspicious adnexal masses are seen. Trace free fluid is noted within the pelvis. Other: No additional soft tissue abnormalities are seen. Musculoskeletal: No acute osseous abnormalities are identified. Sclerosis is noted at the pubic symphysis. Endplate sclerosis is noted at L4-L5, with multilevel vacuum phenomenon along the lumbar spine. The visualized musculature is unremarkable in appearance. IMPRESSION: 1. Mild wall thickening along the cecum and ascending colon, concerning for infectious or inflammatory colitis. 2. Common bile duct measures up to 1.3 cm in diameter, raising concern for distal obstruction. Minimal prominence of the intrahepatic biliary ducts. Would correlate with LFTs, and consider MRCP or ERCP for further evaluation. 3. Scattered aortic atherosclerosis. 4. Calcified uterine fibroid noted. 5. 1.4 cm nonspecific hypodensity at the anterior aspect of the spleen has increased only mildly in size from 2015 and is likely benign. 6. Mild degenerative change along the lumbar spine. Electronically Signed   By: Garald Balding M.D.   On: 11/21/2016 00:39    Lab Results: Basic Metabolic Panel: No results for input(s): NA, K, CL, CO2, GLUCOSE, BUN, CREATININE, CALCIUM, MG, PHOS in the last 72 hours. Liver Function Tests: No results for input(s): AST, ALT, ALKPHOS, BILITOT, PROT, ALBUMIN in the last 72 hours.   CBC: No results for input(s): WBC, NEUTROABS, HGB, HCT, MCV, PLT in the last 72 hours.  Recent Results (from the past 240 hour(s))  Blood Culture (routine x 2)     Status: None   Collection Time: 11/20/16 11:22  PM  Result Value Ref Range Status   Specimen Description RIGHT ANTECUBITAL  Final   Special Requests   Final    BOTTLES DRAWN AEROBIC AND ANAEROBIC Blood Culture adequate volume   Culture NO GROWTH 5 DAYS  Final   Report Status 11/25/2016 FINAL  Final  Blood Culture (routine x 2)     Status: None   Collection Time: 11/20/16 11:25 PM  Result Value Ref Range Status   Specimen Description BLOOD LEFT HAND  Final   Special Requests   Final    BOTTLES DRAWN AEROBIC AND ANAEROBIC Blood Culture adequate volume   Culture NO GROWTH 5 DAYS  Final   Report Status 11/25/2016 FINAL  Final  Urine culture     Status: Abnormal   Collection Time: 11/21/16  3:00 AM  Result Value Ref Range Status   Specimen Description URINE, CLEAN CATCH  Final   Special Requests NONE  Final   Culture MULTIPLE SPECIES PRESENT, SUGGEST RECOLLECTION (A)  Final   Report Status 11/22/2016 FINAL  Final  MRSA PCR Screening     Status: None   Collection Time: 11/21/16  3:50 AM  Result Value Ref Range Status   MRSA by PCR NEGATIVE NEGATIVE Final    Comment:        The GeneXpert MRSA Assay (FDA approved for NASAL specimens only), is one component of a comprehensive MRSA colonization surveillance program. It is not intended to diagnose MRSA infection nor to guide or  monitor treatment for MRSA infections.   Gastrointestinal Panel by PCR , Stool     Status: Abnormal   Collection Time: 11/21/16 10:50 PM  Result Value Ref Range Status   Campylobacter species DETECTED (A) NOT DETECTED Final    Comment: RESULT CALLED TO, READ BACK BY AND VERIFIED WITH: LISA BULLINS AT 1735 ON 11/22/2016 JJB    Plesimonas shigelloides NOT DETECTED NOT DETECTED Final   Salmonella species NOT DETECTED NOT DETECTED Final   Yersinia enterocolitica NOT DETECTED NOT DETECTED Final   Vibrio species NOT DETECTED NOT DETECTED Final   Vibrio cholerae NOT DETECTED NOT DETECTED Final   Enteroaggregative E coli (EAEC) NOT DETECTED NOT DETECTED Final    Enteropathogenic E coli (EPEC) DETECTED (A) NOT DETECTED Final    Comment: RESULT CALLED TO, READ BACK BY AND VERIFIED WITH: LISA BULLINS AT 1735 ON 11/22/2016 JJB    Enterotoxigenic E coli (ETEC) NOT DETECTED NOT DETECTED Final   Shiga like toxin producing E coli (STEC) NOT DETECTED NOT DETECTED Final   Shigella/Enteroinvasive E coli (EIEC) NOT DETECTED NOT DETECTED Final   Cryptosporidium NOT DETECTED NOT DETECTED Final   Cyclospora cayetanensis NOT DETECTED NOT DETECTED Final   Entamoeba histolytica NOT DETECTED NOT DETECTED Final   Giardia lamblia NOT DETECTED NOT DETECTED Final   Adenovirus F40/41 NOT DETECTED NOT DETECTED Final   Astrovirus NOT DETECTED NOT DETECTED Final   Norovirus GI/GII NOT DETECTED NOT DETECTED Final   Rotavirus A NOT DETECTED NOT DETECTED Final   Sapovirus (I, II, IV, and V) NOT DETECTED NOT DETECTED Final     Hospital Course: She came to the hospital with nausea vomiting and diarrhea. I had seen her in my office about 2 days prior and told her that if she wasn't better in about 2 days she needs to seek further care. In the office it looked like she probably had a viral gastroenteritis and she was treated symptomatically. When she came to the emergency department she was started on IV fluids and had CT of the abdomen and pelvis done that showed an area of colitis. She had GI pathogen panel that eventually grew Campylobacter. She had already been started on appropriate antibiotics. Cipro was continued. She improved over the next several days still had some diarrhea which was less still had some abdominal discomfort which was last but was ready for discharge.  Discharge Exam: Blood pressure (!) 124/56, pulse (!) 50, temperature 98.1 F (36.7 C), temperature source Oral, resp. rate 18, height 5\' 5"  (1.651 m), weight 72.6 kg (160 lb), SpO2 96 %. She is awake and alert. Her abdomen is soft. Bowel sounds are present and active.  Disposition: Home follow up in my  office      Signed: , L   11/26/2016, 8:39 AM

## 2016-11-26 NOTE — Progress Notes (Signed)
Patient's IV removed.  Site WNL.  AVS reviewed with patient.  Verbalized understanding of discharge instructions, physician follow-up, medications.  Patient transported by NT via w/c to main entrance for discharge.  Patient stable at time of discharge.

## 2016-11-26 NOTE — Care Management Note (Signed)
Case Management Note  Patient Details  Name: Erika Lee MRN: [id number] Date of Birth: 06/27/1946  Subjective/Objective:                  Admitted with colitis. Chart reviewed for CM needs. From home, ind with ADL's. Has PCP, transportation and insurance with drug coverage.  Action/Plan: Pt discharging home with self care. No CM needs anticipated.   Expected Discharge Date:  11/26/16               Expected Discharge Plan:  Home/Self Care  In-House Referral:  NA  Discharge planning Services  CM Consult  Post Acute Care Choice:  NA Choice offered to:  NA  Status of Service:  Completed, signed off  If discussed at [place] of Stay Meetings, dates discussed:  11/26/2016    Sherald Barge, RN 11/26/2016, 4:02 PM

## 2016-11-26 NOTE — Progress Notes (Signed)
Late entry:  Patient tolerated carb modified diet at lunch.  Patient reports one episode of diarrhea small amount.  Dr. Luan Pulling notified.  Patient to be discharged this afternoon.

## 2016-11-26 NOTE — Progress Notes (Signed)
Subjective: She still has a little bit of diarrhea. No other new complaints.  Objective: Vital signs in last 24 hours: Temp:  [98.1 F (36.7 C)-98.7 F (37.1 C)] 98.1 F (36.7 C) (05/29 0458) Pulse Rate:  [50-59] 50 (05/29 0458) Resp:  [18-20] 18 (05/29 0458) BP: (124-152)/(56-71) 124/56 (05/29 0458) SpO2:  [96 %-98 %] 96 % (05/29 0458) Weight change:  Last BM Date: 11/25/16  Intake/Output from previous day: 05/28 0701 - 05/29 0700 In: 840 [P.O.:840] Out: -   PHYSICAL EXAM General appearance: alert, cooperative and no distress Resp: clear to auscultation bilaterally Cardio: regular rate and rhythm, S1, S2 normal, no murmur, click, rub or gallop GI: No tenderness now Extremities: extremities normal, atraumatic, no cyanosis or edema Skin warm and dry. Mucous membranes are moist  Lab Results:  Results for orders placed or performed during the hospital encounter of 11/20/16 (from the past 48 hour(s))  Glucose, capillary     Status: Abnormal   Collection Time: 11/24/16 11:31 AM  Result Value Ref Range   Glucose-Capillary 133 (H) 65 - 99 mg/dL   Comment 1 Notify RN    Comment 2 Document in Chart   Glucose, capillary     Status: Abnormal   Collection Time: 11/24/16  4:17 PM  Result Value Ref Range   Glucose-Capillary 213 (H) 65 - 99 mg/dL  Glucose, capillary     Status: Abnormal   Collection Time: 11/24/16  8:32 PM  Result Value Ref Range   Glucose-Capillary 133 (H) 65 - 99 mg/dL  Glucose, capillary     Status: Abnormal   Collection Time: 11/25/16 12:13 AM  Result Value Ref Range   Glucose-Capillary 133 (H) 65 - 99 mg/dL   Comment 1 Notify RN    Comment 2 Document in Chart   Glucose, capillary     Status: Abnormal   Collection Time: 11/25/16  7:14 AM  Result Value Ref Range   Glucose-Capillary 124 (H) 65 - 99 mg/dL  Glucose, capillary     Status: Abnormal   Collection Time: 11/25/16 11:33 AM  Result Value Ref Range   Glucose-Capillary 144 (H) 65 - 99 mg/dL   Glucose, capillary     Status: Abnormal   Collection Time: 11/25/16  4:28 PM  Result Value Ref Range   Glucose-Capillary 189 (H) 65 - 99 mg/dL   Comment 1 Notify RN    Comment 2 Document in Chart   Glucose, capillary     Status: Abnormal   Collection Time: 11/25/16  8:07 PM  Result Value Ref Range   Glucose-Capillary 149 (H) 65 - 99 mg/dL   Comment 1 Notify RN    Comment 2 Document in Chart   Glucose, capillary     Status: None   Collection Time: 11/25/16 11:53 PM  Result Value Ref Range   Glucose-Capillary 93 65 - 99 mg/dL   Comment 1 Notify RN    Comment 2 Document in Chart   Glucose, capillary     Status: Abnormal   Collection Time: 11/26/16  3:49 AM  Result Value Ref Range   Glucose-Capillary 120 (H) 65 - 99 mg/dL    ABGS No results for input(s): PHART, PO2ART, TCO2, HCO3 in the last 72 hours.  Invalid input(s): PCO2 CULTURES Recent Results (from the past 240 hour(s))  Blood Culture (routine x 2)     Status: None   Collection Time: 11/20/16 11:22 PM  Result Value Ref Range Status   Specimen Description RIGHT ANTECUBITAL  Final  Special Requests   Final    BOTTLES DRAWN AEROBIC AND ANAEROBIC Blood Culture adequate volume   Culture NO GROWTH 5 DAYS  Final   Report Status 11/25/2016 FINAL  Final  Blood Culture (routine x 2)     Status: None   Collection Time: 11/20/16 11:25 PM  Result Value Ref Range Status   Specimen Description BLOOD LEFT HAND  Final   Special Requests   Final    BOTTLES DRAWN AEROBIC AND ANAEROBIC Blood Culture adequate volume   Culture NO GROWTH 5 DAYS  Final   Report Status 11/25/2016 FINAL  Final  Urine culture     Status: Abnormal   Collection Time: 11/21/16  3:00 AM  Result Value Ref Range Status   Specimen Description URINE, CLEAN CATCH  Final   Special Requests NONE  Final   Culture MULTIPLE SPECIES PRESENT, SUGGEST RECOLLECTION (A)  Final   Report Status 11/22/2016 FINAL  Final  MRSA PCR Screening     Status: None   Collection  Time: 11/21/16  3:50 AM  Result Value Ref Range Status   MRSA by PCR NEGATIVE NEGATIVE Final    Comment:        The GeneXpert MRSA Assay (FDA approved for NASAL specimens only), is one component of a comprehensive MRSA colonization surveillance program. It is not intended to diagnose MRSA infection nor to guide or monitor treatment for MRSA infections.   Gastrointestinal Panel by PCR , Stool     Status: Abnormal   Collection Time: 11/21/16 10:50 PM  Result Value Ref Range Status   Campylobacter species DETECTED (A) NOT DETECTED Final    Comment: RESULT CALLED TO, READ BACK BY AND VERIFIED WITH: LISA BULLINS AT 1735 ON 11/22/2016 JJB    Plesimonas shigelloides NOT DETECTED NOT DETECTED Final   Salmonella species NOT DETECTED NOT DETECTED Final   Yersinia enterocolitica NOT DETECTED NOT DETECTED Final   Vibrio species NOT DETECTED NOT DETECTED Final   Vibrio cholerae NOT DETECTED NOT DETECTED Final   Enteroaggregative E coli (EAEC) NOT DETECTED NOT DETECTED Final   Enteropathogenic E coli (EPEC) DETECTED (A) NOT DETECTED Final    Comment: RESULT CALLED TO, READ BACK BY AND VERIFIED WITH: LISA BULLINS AT 1735 ON 11/22/2016 JJB    Enterotoxigenic E coli (ETEC) NOT DETECTED NOT DETECTED Final   Shiga like toxin producing E coli (STEC) NOT DETECTED NOT DETECTED Final   Shigella/Enteroinvasive E coli (EIEC) NOT DETECTED NOT DETECTED Final   Cryptosporidium NOT DETECTED NOT DETECTED Final   Cyclospora cayetanensis NOT DETECTED NOT DETECTED Final   Entamoeba histolytica NOT DETECTED NOT DETECTED Final   Giardia lamblia NOT DETECTED NOT DETECTED Final   Adenovirus F40/41 NOT DETECTED NOT DETECTED Final   Astrovirus NOT DETECTED NOT DETECTED Final   Norovirus GI/GII NOT DETECTED NOT DETECTED Final   Rotavirus A NOT DETECTED NOT DETECTED Final   Sapovirus (I, II, IV, and V) NOT DETECTED NOT DETECTED Final   Studies/Results: No results found.  Medications:  Prior to Admission:   Prescriptions Prior to Admission  Medication Sig Dispense Refill Last Dose  . apixaban (ELIQUIS) 5 MG TABS tablet Take 1 tablet (5 mg total) by mouth 2 (two) times daily. 60 tablet 12 11/20/2016 at [place]  . esomeprazole (NEXIUM) 20 MG capsule Take 20 mg by mouth daily.    11/20/2016 at Unknown time  . hydrochlorothiazide 25 MG tablet Take 25 mg by mouth daily.     11/20/2016 at Unknown time  .  HYDROcodone-acetaminophen (NORCO) 7.5-325 MG tablet Take 1-2 tablets by mouth every 4 (four) hours as needed for moderate pain or severe pain. 50 tablet 0 11/20/2016 at Unknown time  . lisinopril (PRINIVIL,ZESTRIL) 20 MG tablet Take 1 tablet (20 mg total) by mouth daily. 30 tablet 11 11/20/2016 at Unknown time  . metFORMIN (GLUCOPHAGE) 500 MG tablet Take 1 tablet (500 mg total) by mouth daily.   11/20/2016 at Unknown time  . metoprolol succinate (TOPROL XL) 25 MG 24 hr tablet Take 0.5 tablets (12.5 mg total) by mouth daily. 45 tablet 3 11/20/2016 at 100a  . ondansetron (ZOFRAN-ODT) 8 MG disintegrating tablet PLACE 1 TABLET UNDER THE TONGUE AS NEEDED FOR NAUSEA AND VOMITING  12 11/19/2016 at Unknown time  . Potassium Gluconate 550 MG TABS Take 550 mg by mouth daily.    11/20/2016 at Unknown time  . simvastatin (ZOCOR) 40 MG tablet Take 40 mg by mouth at bedtime.     11/19/2016 at Unknown time   Scheduled: . apixaban  5 mg Oral BID  . ciprofloxacin  500 mg Oral BID  . feeding supplement (GLUCERNA SHAKE)  237 mL Oral TID BM  . fluticasone  2 spray Each Nare Daily  . insulin aspart  0-15 Units Subcutaneous Q4H  . pantoprazole  40 mg Oral Daily  . potassium chloride  40 mEq Oral BID  . simvastatin  40 mg Oral QHS   Continuous:  FHL:[id number] **OR** acetaminophen, HYDROcodone-acetaminophen, lip balm, ondansetron (ZOFRAN) IV **OR** ondansetron, sodium chloride  Assesment: She was admitted with colitis secondary to Campylobacter. She is getting better. I think she's probably okay to discharge today.  She was  dehydrated on admission and that is improved  She had acute kidney injury from her dehydration and that is back to baseline  She has diabetes and her blood sugar was up but I think that was from nutritional supplements  She has hypertension which is well controlled  She has paroxysmal atrial fibrillation and she is in sinus rhythm now Principal Problem:   Colitis Active Problems:   Essential hypertension, benign   Paroxysmal atrial fibrillation (HCC)   Type 2 diabetes mellitus (HCC)   Hypotension (arterial)   Volume depletion   Diabetes mellitus   Hyperlipidemia   A-fib ([place])   Diarrhea   Acute kidney injury ([place])   Hypokalemia    Plan: Advance back to carb modified diet and if she does okay with that without copious diarrhea I think she can go home as were not doing anything now but can't be accomplished at home    LOS: 5 days   , L 11/26/2016, 8:16 AM

## 2016-11-28 LAB — CUP PACEART REMOTE DEVICE CHECK
Date Time Interrogation Session: 20180527023537
Implantable Pulse Generator Implant Date: 20160126

## 2016-12-05 DIAGNOSIS — I10 Essential (primary) hypertension: Secondary | ICD-10-CM | POA: Diagnosis not present

## 2016-12-05 DIAGNOSIS — K219 Gastro-esophageal reflux disease without esophagitis: Secondary | ICD-10-CM | POA: Diagnosis not present

## 2016-12-05 DIAGNOSIS — E119 Type 2 diabetes mellitus without complications: Secondary | ICD-10-CM | POA: Diagnosis not present

## 2016-12-05 DIAGNOSIS — A045 Campylobacter enteritis: Secondary | ICD-10-CM | POA: Diagnosis not present

## 2016-12-09 ENCOUNTER — Inpatient Hospital Stay (HOSPITAL_COMMUNITY): Admission: RE | Admit: 2016-12-09 | Payer: PPO | Source: Ambulatory Visit | Admitting: Nurse Practitioner

## 2016-12-12 ENCOUNTER — Ambulatory Visit (HOSPITAL_COMMUNITY)
Admission: RE | Admit: 2016-12-12 | Discharge: 2016-12-12 | Disposition: A | Discharge status: Home / Self Care | Payer: PPO | Source: Ambulatory Visit | Attending: Nurse Practitioner | Admitting: Nurse Practitioner

## 2016-12-12 ENCOUNTER — Encounter (HOSPITAL_COMMUNITY): Payer: Self-pay | Admitting: Nurse Practitioner

## 2016-12-12 VITALS — BP 158/86 | HR 56 | Ht 65.0 in | Wt 159.8 lb

## 2016-12-12 DIAGNOSIS — K219 Gastro-esophageal reflux disease without esophagitis: Secondary | ICD-10-CM | POA: Diagnosis not present

## 2016-12-12 DIAGNOSIS — I10 Essential (primary) hypertension: Secondary | ICD-10-CM | POA: Diagnosis not present

## 2016-12-12 DIAGNOSIS — Z7984 Long term (current) use of oral hypoglycemic drugs: Secondary | ICD-10-CM | POA: Insufficient documentation

## 2016-12-12 DIAGNOSIS — M1990 Unspecified osteoarthritis, unspecified site: Secondary | ICD-10-CM | POA: Diagnosis not present

## 2016-12-12 DIAGNOSIS — Z87891 Personal history of nicotine dependence: Secondary | ICD-10-CM | POA: Insufficient documentation

## 2016-12-12 DIAGNOSIS — I480 Paroxysmal atrial fibrillation: Secondary | ICD-10-CM

## 2016-12-12 DIAGNOSIS — I4891 Unspecified atrial fibrillation: Secondary | ICD-10-CM

## 2016-12-12 DIAGNOSIS — R7303 Prediabetes: Secondary | ICD-10-CM | POA: Insufficient documentation

## 2016-12-12 DIAGNOSIS — I4892 Unspecified atrial flutter: Secondary | ICD-10-CM | POA: Diagnosis not present

## 2016-12-12 DIAGNOSIS — E785 Hyperlipidemia, unspecified: Secondary | ICD-10-CM | POA: Diagnosis not present

## 2016-12-12 NOTE — Progress Notes (Signed)
Patient ID: Erika Lee, female   DOB: 15-Nov-1945, 71 y.o.   MRN: [id number]      Primary Care Physician: Sinda Du, MD Referring Physician: Dr. Talbot Grumbling Erika Lee is a 71 y.o. female with a h/o afib s/p ablation, that is in afib clinic for f/u. She reports that she is doing well without any symptoms of afib. No bleeding issues with apixaban. Overall, she is feeling well. Heart rate at home upper 40's to mid 50's. Not symptomatic with low heart rate.Recently had hemorrhoid surgery without any issues.  F/u Afib clinic 6/14. She is not having any afib but was in the hospital for a week last of May for food poisoning after eating flounder at a local seafood restaurant. This is now resolved but she was very dehydrated when she presented to [facility].  Today, she denies symptoms of palpitations, chest pain, shortness of breath, orthopnea, PND, lower extremity edema, dizziness, presyncope, syncope, or neurologic sequela. The patient is tolerating medications without difficulties and is otherwise without complaint today.   Past Medical History:  Diagnosis Date  . Arthritis   . Atrial flutter ([place])    s/p CTI by [name] 07/16/2013  . Diabetes mellitus    Borderline  . Essential hypertension, benign   . GERD (gastroesophageal reflux disease)   . Hemorrhoids   . Hyperlipidemia   . Multinodular goiter   . Osteoarthritis   . Palpitations   . Paroxysmal atrial fibrillation (HCC)    s/p PVI by [name] 07/16/2013   Past Surgical History:  Procedure Laterality Date  . ATRIAL FIBRILLATION ABLATION N/A 07/16/2013   PVI and CTI ablation by [name]  . BIOPSY THYROID    . COLONOSCOPY  09/10/2011   Procedure: COLONOSCOPY;  Surgeon: Jamesetta So, MD;  [facility]O [facility];  Service: Gastroenterology;  Laterality: N/A;  . LESION REMOVAL N/A 05/03/2013   Procedure: EXCISION CYST, BACK;  Surgeon: Jamesetta So, MD;  Location: [facility] ORS;  Service: General;  Laterality:  N/A;  . LOOP RECORDER IMPLANT N/A 07/26/2014   Procedure: LOOP RECORDER IMPLANT;  Surgeon: Thompson Grayer, MD;  Location: [facility] CATH LAB;  Service: Cardiovascular;  Laterality: N/A;  . TEE WITHOUT CARDIOVERSION N/A 07/15/2013   Procedure: TRANSESOPHAGEAL ECHOCARDIOGRAM (TEE);  Surgeon: Lelon Perla, MD;  Location: [facility] ENDOSCOPY;  Service: Cardiovascular;  Laterality: N/A;  . TONSILLECTOMY    . TRANSANAL HEMORRHOIDAL DEARTERIALIZATION N/A 09/22/2015   Procedure: TRANSANAL HEMORRHOIDAL LIGATION/PEXY EUA POSSIBLE HEMORRHOIDECTOMY ;  Surgeon: Michael Boston, MD;  Location: [facility] ORS;  Service: General;  Laterality: N/A;    Current Outpatient Prescriptions  Medication Sig Dispense Refill  . apixaban (ELIQUIS) 5 MG TABS tablet Take 1 tablet (5 mg total) by mouth 2 (two) times daily. 60 tablet 12  . esomeprazole (NEXIUM) 20 MG capsule Take 20 mg by mouth daily.     . hydrochlorothiazide 25 MG tablet Take 25 mg by mouth daily.      Marland Kitchen HYDROcodone-acetaminophen (NORCO) 7.5-325 MG tablet Take 1-2 tablets by mouth every 4 (four) hours as needed for moderate pain or severe pain. 50 tablet 0  . lisinopril (PRINIVIL,ZESTRIL) 20 MG tablet Take 1 tablet (20 mg total) by mouth daily. 30 tablet 11  . metFORMIN (GLUCOPHAGE) 500 MG tablet Take 1 tablet (500 mg total) by mouth daily.    . metoprolol succinate (TOPROL XL) 25 MG 24 hr tablet Take 0.5 tablets (12.5 mg total) by mouth daily. 45 tablet 3  .  Potassium Gluconate 550 MG TABS Take 550 mg by mouth daily.     . simvastatin (ZOCOR) 40 MG tablet Take 40 mg by mouth at bedtime.       No current facility-administered medications for this encounter.     Allergies  Allergen Reactions  . Clindamycin/Lincomycin Rash  . Doxycycline Other (See Comments)    Makes heart race  . Keflex [Cephalexin] Nausea And Vomiting  . Adhesive [Tape] Rash  . Latex Rash  . Sulfonamide Derivatives Nausea And Vomiting    Social History   Social History  . Marital status: Married     Spouse name: N/A  . Number of children: N/A  . Years of education: N/A   Occupational History  . Trucking Business Retired    Full time   Social History Main Topics  . Smoking status: Former Smoker    Types: Cigarettes    Quit date: 01/02/1994  . Smokeless tobacco: Never Used  . Alcohol use No  . Drug use: No  . Sexual activity: Not on file   Other Topics Concern  . Not on file   Social History Narrative   Lives with spouse in [place]    Family History  Problem Relation Age of Onset  . Stroke Mother 68  . Stroke Maternal Grandmother        stroke  . Other Maternal Grandfather        "heart dropsy" kidney issues  . Kidney disease Maternal Grandfather     ROS- All systems are reviewed and negative except as per the HPI above  Physical Exam: Vitals:   12/12/16 1528  BP: (!) 158/86  Pulse: (!) 56  Weight: 159 lb 12.8 oz (72.5 kg)  Height: 5\' 5"  (1.651 m)    GEN- The patient is well appearing, alert and oriented x 3 today.   Head- normocephalic, atraumatic Eyes-  Sclera clear, conjunctiva pink Ears- hearing intact Oropharynx- clear Neck- supple, no JVP Lymph- no cervical lymphadenopathy Lungs- Clear to ausculation bilaterally, normal work of breathing Heart- Regular rate and rhythm, no murmurs, rubs or gallops, PMI not laterally displaced GI- soft, NT, ND, + BS Extremities- no clubbing, cyanosis, or edema MS- no significant deformity or atrophy Skin- no rash or lesion Psych- euthymic mood, full affect Neuro- strength and sensation are intact  EKG- Sinus brady at 56 bpm. Pr int 174 ms, qtrs 92 ms, qtc 411 ms. Recent hospitalization records reviewed  Assessment and Plan: 1. S/p afib ablation 07/2013. Maintaining SR s/p ablation Doing well  Continue metoprolol watching for symptomatic brady Continue apixaban  Recheck in 6 months with Dr. Lawrence Marseilles C. , [facility] [address] [place], [place]  [id number] [phone]

## 2016-12-24 ENCOUNTER — Ambulatory Visit (INDEPENDENT_AMBULATORY_CARE_PROVIDER_SITE_OTHER): Payer: PPO | Admitting: *Deleted

## 2016-12-24 DIAGNOSIS — I495 Sick sinus syndrome: Secondary | ICD-10-CM | POA: Diagnosis not present

## 2016-12-24 NOTE — Progress Notes (Signed)
Carelink Summary Report / Loop Recorder 

## 2017-01-05 LAB — CUP PACEART REMOTE DEVICE CHECK
Date Time Interrogation Session: 20180626023840
MDC IDC PG IMPLANT DT: 20160126

## 2017-01-05 NOTE — Progress Notes (Signed)
Carelink summary report received. Battery status OK. Normal device function. No new symptom episodes, tachy episodes, brady, or pause episodes. No new AF episodes. Monthly summary reports and ROV/PRN 

## 2017-01-07 DIAGNOSIS — I10 Essential (primary) hypertension: Secondary | ICD-10-CM | POA: Diagnosis not present

## 2017-01-07 DIAGNOSIS — E119 Type 2 diabetes mellitus without complications: Secondary | ICD-10-CM | POA: Diagnosis not present

## 2017-01-07 DIAGNOSIS — I4891 Unspecified atrial fibrillation: Secondary | ICD-10-CM | POA: Diagnosis not present

## 2017-01-07 DIAGNOSIS — K219 Gastro-esophageal reflux disease without esophagitis: Secondary | ICD-10-CM | POA: Diagnosis not present

## 2017-01-23 ENCOUNTER — Ambulatory Visit (INDEPENDENT_AMBULATORY_CARE_PROVIDER_SITE_OTHER): Payer: PPO | Admitting: *Deleted

## 2017-01-23 DIAGNOSIS — I4891 Unspecified atrial fibrillation: Secondary | ICD-10-CM | POA: Diagnosis not present

## 2017-01-23 NOTE — Progress Notes (Signed)
Carelink Summary Report / Loop Recorder 

## 2017-02-04 ENCOUNTER — Other Ambulatory Visit (HOSPITAL_COMMUNITY): Payer: Self-pay | Admitting: Pulmonary Disease

## 2017-02-04 DIAGNOSIS — K219 Gastro-esophageal reflux disease without esophagitis: Secondary | ICD-10-CM | POA: Diagnosis not present

## 2017-02-04 DIAGNOSIS — R112 Nausea with vomiting, unspecified: Secondary | ICD-10-CM

## 2017-02-04 DIAGNOSIS — R109 Unspecified abdominal pain: Secondary | ICD-10-CM | POA: Diagnosis not present

## 2017-02-04 DIAGNOSIS — A045 Campylobacter enteritis: Secondary | ICD-10-CM | POA: Diagnosis not present

## 2017-02-04 DIAGNOSIS — I10 Essential (primary) hypertension: Secondary | ICD-10-CM | POA: Diagnosis not present

## 2017-02-05 DIAGNOSIS — A045 Campylobacter enteritis: Secondary | ICD-10-CM | POA: Diagnosis not present

## 2017-02-05 DIAGNOSIS — K219 Gastro-esophageal reflux disease without esophagitis: Secondary | ICD-10-CM | POA: Diagnosis not present

## 2017-02-05 DIAGNOSIS — R109 Unspecified abdominal pain: Secondary | ICD-10-CM | POA: Diagnosis not present

## 2017-02-05 DIAGNOSIS — I10 Essential (primary) hypertension: Secondary | ICD-10-CM | POA: Diagnosis not present

## 2017-02-06 LAB — CUP PACEART REMOTE DEVICE CHECK
MDC IDC PG IMPLANT DT: 20160126
MDC IDC SESS DTM: 20180726033700

## 2017-02-10 ENCOUNTER — Ambulatory Visit (HOSPITAL_COMMUNITY)
Admission: RE | Admit: 2017-02-10 | Discharge: 2017-02-10 | Disposition: A | Discharge status: Home / Self Care | Payer: PPO | Source: Ambulatory Visit | Attending: Pulmonary Disease | Admitting: Pulmonary Disease

## 2017-02-10 DIAGNOSIS — R112 Nausea with vomiting, unspecified: Secondary | ICD-10-CM

## 2017-02-10 DIAGNOSIS — K839 Disease of biliary tract, unspecified: Secondary | ICD-10-CM | POA: Insufficient documentation

## 2017-02-10 DIAGNOSIS — K760 Fatty (change of) liver, not elsewhere classified: Secondary | ICD-10-CM | POA: Diagnosis not present

## 2017-02-11 ENCOUNTER — Encounter: Payer: Self-pay | Admitting: Internal Medicine

## 2017-02-20 ENCOUNTER — Ambulatory Visit (INDEPENDENT_AMBULATORY_CARE_PROVIDER_SITE_OTHER): Payer: PPO | Admitting: Gastroenterology

## 2017-02-20 ENCOUNTER — Encounter: Payer: Self-pay | Admitting: Gastroenterology

## 2017-02-20 ENCOUNTER — Telehealth: Payer: Self-pay

## 2017-02-20 VITALS — BP 145/75 | HR 47 | Temp 97.3°F | Ht 65.0 in | Wt 158.2 lb

## 2017-02-20 DIAGNOSIS — R112 Nausea with vomiting, unspecified: Secondary | ICD-10-CM

## 2017-02-20 DIAGNOSIS — A045 Campylobacter enteritis: Secondary | ICD-10-CM | POA: Diagnosis not present

## 2017-02-20 DIAGNOSIS — K838 Other specified diseases of biliary tract: Secondary | ICD-10-CM | POA: Diagnosis not present

## 2017-02-20 MED ORDER — DIAZEPAM 10 MG PO TABS: 10.0000 mg | ORAL_TABLET | Freq: Once | ORAL | 0 refills | Status: AC

## 2017-02-20 NOTE — Progress Notes (Addendum)
Primary Care Physician:  Sinda Du, MD  Primary Gastroenterologist:  Garfield Cornea, MD   Chief Complaint  Patient presents with  . Nausea  . Emesis    HPI:  Erika Lee is a 71 y.o. female here At the request of Dr. Sinda Du for further evaluation of nausea and vomiting. Patient was admitted to the hospital back in May with nausea, vomiting, diarrhea. She reports symptoms began after eating out in a [employer], she had flounder. After a few bites things didn't taste right she stopped eating. By the following day she developed nausea, vomiting, diarrhea. She quickly became significantly dehydrated. She was admitted to the hospital. In the ED she had a CT of abdomen and pelvis with contrast that showed bile duct 1.3 cm, minimal prominence of the intrahepatic biliary ducts. Mild wall thickening in the cecum and ascending colon, infectious versus inflammatory colitis. 1.4 cm nonspecific hypodensity at the anterior aspect of the spleen increased only mildly in size from 2015, likely benign.  GI pathogen panel was positive for Campylobacter species, enteropathic Escherichia coli. Treated with cipro.   For persistent vomiting she had an outpatient Abdominal ultrasound 02/10/2017, mild prominence of the common bile duct measuring up to 0.9 cm, similar to May 2018 CT., Hepatic steatosis. Normal gallbladder.  Now still having problems eating. She tolerate some food one day but up the next. May have delayed vomiting after supper. Does okay during the day but she admits that she doesn't really eat a lot during the day. Vomiting about twice per week. Her bowel function had returned to normal however couple of days ago she had short-lived diarrhea, multiple members from her church also sick. She denies melena rectal bleeding. No abdominal pain. No heartburn, hematemesis, dysphagia. Chronically has been on metformin.    Current Outpatient Prescriptions  Medication Sig Dispense Refill  .  apixaban (ELIQUIS) 5 MG TABS tablet Take 1 tablet (5 mg total) by mouth 2 (two) times daily. 60 tablet 12  . esomeprazole (NEXIUM) 20 MG capsule Take 20 mg by mouth daily.     . hydrochlorothiazide 25 MG tablet Take 25 mg by mouth daily.      Marland Kitchen HYDROcodone-acetaminophen (NORCO) 7.5-325 MG tablet Take 1-2 tablets by mouth every 4 (four) hours as needed for moderate pain or severe pain. 50 tablet 0  . lisinopril (PRINIVIL,ZESTRIL) 20 MG tablet Take 1 tablet (20 mg total) by mouth daily. 30 tablet 11  . metFORMIN (GLUCOPHAGE) 500 MG tablet Take 1 tablet (500 mg total) by mouth daily.    . metoprolol succinate (TOPROL XL) 25 MG 24 hr tablet Take 0.5 tablets (12.5 mg total) by mouth daily. 45 tablet 3  . Potassium Gluconate 550 MG TABS Take 550 mg by mouth daily.     . simvastatin (ZOCOR) 40 MG tablet Take 40 mg by mouth at bedtime.       No current facility-administered medications for this visit.     Allergies as of 02/20/2017 - Review Complete 02/20/2017  Allergen Reaction Noted  . Clindamycin/lincomycin Rash 04/13/2013  . Doxycycline Other (See Comments) 09/10/2011  . Keflex [cephalexin] Nausea And Vomiting 09/13/2015  . Adhesive [tape] Rash 04/13/2013  . Latex Rash 07/24/2013  . Sulfonamide derivatives Nausea And Vomiting     Past Medical History:  Diagnosis Date  . Arthritis   . Atrial flutter ([place])    s/p CTI by [name] 07/16/2013  . Diabetes mellitus    Borderline  . Essential hypertension, benign   .  GERD (gastroesophageal reflux disease)   . Hemorrhoids   . Hyperlipidemia   . Multinodular goiter   . Osteoarthritis   . Palpitations   . Paroxysmal atrial fibrillation (HCC)    s/p PVI by [name] 07/16/2013    Past Surgical History:  Procedure Laterality Date  . ATRIAL FIBRILLATION ABLATION N/A 07/16/2013   PVI and CTI ablation by [name]  . BIOPSY THYROID    . COLONOSCOPY  09/10/2011   Dr. Arnoldo Morale: normal, 10 year follow up.  . LESION REMOVAL N/A 05/03/2013    Procedure: EXCISION CYST, BACK;  Surgeon: Jamesetta So, MD;  Location: [facility] ORS;  Service: General;  Laterality: N/A;  . LOOP RECORDER IMPLANT N/A 07/26/2014   Procedure: LOOP RECORDER IMPLANT;  Surgeon: Thompson Grayer, MD;  Location: [facility] CATH LAB;  Service: Cardiovascular;  Laterality: N/A;  . TEE WITHOUT CARDIOVERSION N/A 07/15/2013   Procedure: TRANSESOPHAGEAL ECHOCARDIOGRAM (TEE);  Surgeon: Lelon Perla, MD;  Location: [facility] ENDOSCOPY;  Service: Cardiovascular;  Laterality: N/A;  . TONSILLECTOMY    . TRANSANAL HEMORRHOIDAL DEARTERIALIZATION N/A 09/22/2015   Procedure: TRANSANAL HEMORRHOIDAL LIGATION/PEXY EUA POSSIBLE HEMORRHOIDECTOMY ;  Surgeon: Michael Boston, MD;  Location: [facility] ORS;  Service: General;  Laterality: N/A;    Family History  Problem Relation Age of Onset  . Stroke Mother 63  . Stroke Maternal Grandmother        stroke  . Other Maternal Grandfather        "heart dropsy" kidney issues  . Kidney disease Maternal Grandfather     Social History   Social History  . Marital status: Married    Spouse name: N/A  . Number of children: N/A  . Years of education: N/A   Occupational History  . Trucking Business Retired    Full time   Social History Main Topics  . Smoking status: Former Smoker    Types: Cigarettes    Quit date: 01/02/1994  . Smokeless tobacco: Never Used  . Alcohol use No  . Drug use: No  . Sexual activity: Not on file   Other Topics Concern  . Not on file   Social History Narrative   Lives with spouse in [place]      ROS:  General: Negative for anorexia, weight loss, fever, chills, fatigue, weakness. Eyes: Negative for vision changes.  ENT: Negative for hoarseness, difficulty swallowing , nasal congestion. CV: Negative for chest pain, angina, palpitations, dyspnea on exertion, peripheral edema.  Respiratory: Negative for dyspnea at rest, dyspnea on exertion, cough, sputum, wheezing.  GI: See history of present illness. GU:  Negative for  dysuria, hematuria, urinary incontinence, urinary frequency, nocturnal urination.  MS: Negative for joint pain, low back pain.  Derm: Negative for rash or itching.  Neuro: Negative for weakness, abnormal sensation, seizure, frequent headaches, memory loss, confusion.  Psych: Negative for anxiety, depression, suicidal ideation, hallucinations.  Endo: Negative for unusual weight change.  Heme: Negative for bruising or bleeding. Allergy: Negative for rash or hives.    Physical Examination:  BP (!) 145/75   Pulse (!) 47   Temp (!) 97.3 F (36.3 C) (Oral)   Ht 5\' 5"  (1.651 m)   Wt 158 lb 3.2 oz (71.8 kg)   BMI 26.33 kg/m    General: Well-nourished, well-developed in no acute distress. Accompanied by spouse Head: Normocephalic, atraumatic.   Eyes: Conjunctiva pink, no icterus. Mouth: Oropharyngeal mucosa moist and pink , no lesions erythema or exudate. Neck: Supple without thyromegaly, masses, or lymphadenopathy.  Lungs:  Clear to auscultation bilaterally.  Heart: Regular rate and rhythm, no murmurs rubs or gallops.  Abdomen: Bowel sounds are normal, nontender, nondistended, no hepatosplenomegaly or masses, no abdominal bruits or    hernia , no rebound or guarding.   Rectal: Not performed Extremities: No lower extremity edema. No clubbing or deformities.  Neuro: Alert and oriented x 4 , grossly normal neurologically.  Skin: Warm and dry, no rash or jaundice.   Psych: Alert and cooperative, normal mood and affect.  Labs: Lab Results  Component Value Date   CREATININE 0.57 11/23/2016   BUN <5 (L) 11/23/2016   NA 136 11/23/2016   K 3.8 11/23/2016   CL 106 11/23/2016   CO2 25 11/23/2016   Lab Results  Component Value Date   ALT 23 11/21/2016   AST 22 11/21/2016   ALKPHOS 41 11/21/2016   BILITOT 0.5 11/21/2016   Lab Results  Component Value Date   WBC 6.3 11/22/2016   HGB 11.0 (L) 11/22/2016   HCT 31.0 (L) 11/22/2016   MCV 82.9 11/22/2016   PLT 162 11/22/2016      Imaging Studies: US Abdomen Complete  Result Date: 02/10/2017 CLINICAL DATA:  71 year old female with nausea and vomiting for 1 month. History of hypertension, hyperlipidemia and diabetes. EXAM: ABDOMEN ULTRASOUND COMPLETE COMPARISON:  None. FINDINGS: Gallbladder: No gallstones or wall thickening visualized. No sonographic Murphy sign noted by sonographer. Common bile duct: Diameter: 9 mm, mildly dilated for patient's age. Liver: No focal lesion identified. Increased parenchymal echogenicity. IVC: No abnormality visualized. Pancreas: Visualized portion unremarkable. Spleen: Size and appearance within normal limits. Right Kidney: Length: 10.3. Echogenicity within normal limits. No mass or hydronephrosis visualized. Left Kidney: Length: 10.3. Echogenicity within normal limits. No mass or hydronephrosis visualized. Abdominal aorta: No aneurysm visualized. Other findings: None. IMPRESSION: 1. Mild prominence of the common bile duct measuring up to 0.9 cm, similar to CT dated 11/21/2016. Given normal liver function tests at that time, this is likely a chronic finding for the patient. If further evaluation is clinically indicated, consider ERCP or MRCP. 2. Hepatic steatosis without focal lesion. Electronically Signed   By: Kristopher Oppenheim M.D.   On: 02/10/2017 10:24

## 2017-02-20 NOTE — Telephone Encounter (Signed)
Called Central Scheduling to schedule MRI abdomen with MRCP. Unable to schedule MRI at [facility] d/t pt has an implanted loop recorder. Copy of implanted device card faxed to [facility] at [employer] (fax [phone]). She will call pt to schedule MRCP. Pt is aware.

## 2017-02-20 NOTE — Patient Instructions (Signed)
1. We will obtain copy of records from Dr. Luan Pulling for review.  2. MRI to evaluate dilated bile duct. Take Valium 30 minutes before scheduled MRI.

## 2017-02-24 ENCOUNTER — Encounter: Payer: Self-pay | Admitting: Gastroenterology

## 2017-02-24 ENCOUNTER — Ambulatory Visit (INDEPENDENT_AMBULATORY_CARE_PROVIDER_SITE_OTHER): Payer: PPO | Admitting: *Deleted

## 2017-02-24 DIAGNOSIS — I4891 Unspecified atrial fibrillation: Secondary | ICD-10-CM

## 2017-02-24 NOTE — Progress Notes (Signed)
cc'ed to pcp °

## 2017-02-24 NOTE — Assessment & Plan Note (Signed)
Diarrhea is resolved. She has persistent nausea and vomiting of unclear etiology. 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 She has, bile duct dilation with dilatation of her biliary tree, normal LFTs. Not likely the source of her persistent vomiting. She denies abdominal pain. Would consider MRI imaging to further evaluate bile duct.   Mild anemia during hospitalization, ?dilutional.  Follow-up of recent labs from PCP.

## 2017-02-24 NOTE — Progress Notes (Signed)
Carelink Summary Report / Loop Recorder 

## 2017-02-24 NOTE — Assessment & Plan Note (Signed)
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 Begin multiple small meals daily, do not overeat, avoid fat/greasy foods. Await review of labs and MRI.

## 2017-02-26 NOTE — Patient Instructions (Signed)
PA info for MRI MRCP submitted via [employer]. Case approved. PA# [id number], 02/26/17-05/27/17.

## 2017-02-27 NOTE — Telephone Encounter (Signed)
According to appt desk. Pt is scheduled for MRCP 03/06/17 at 1:00pm.

## 2017-02-28 LAB — CUP PACEART REMOTE DEVICE CHECK
Implantable Pulse Generator Implant Date: 20160126
MDC IDC SESS DTM: 20180825041317

## 2017-03-05 ENCOUNTER — Other Ambulatory Visit: Payer: Self-pay

## 2017-03-06 ENCOUNTER — Other Ambulatory Visit: Payer: Self-pay | Admitting: Gastroenterology

## 2017-03-06 ENCOUNTER — Ambulatory Visit (HOSPITAL_COMMUNITY)
Admission: RE | Admit: 2017-03-06 | Discharge: 2017-03-06 | Disposition: A | Discharge status: Home / Self Care | Payer: PPO | Source: Ambulatory Visit | Attending: Gastroenterology | Admitting: Gastroenterology

## 2017-03-06 DIAGNOSIS — I700 Atherosclerosis of aorta: Secondary | ICD-10-CM | POA: Diagnosis not present

## 2017-03-06 DIAGNOSIS — M47896 Other spondylosis, lumbar region: Secondary | ICD-10-CM | POA: Diagnosis not present

## 2017-03-06 DIAGNOSIS — K760 Fatty (change of) liver, not elsewhere classified: Secondary | ICD-10-CM | POA: Diagnosis not present

## 2017-03-06 DIAGNOSIS — A045 Campylobacter enteritis: Secondary | ICD-10-CM

## 2017-03-06 DIAGNOSIS — R112 Nausea with vomiting, unspecified: Secondary | ICD-10-CM

## 2017-03-06 DIAGNOSIS — M5136 Other intervertebral disc degeneration, lumbar region: Secondary | ICD-10-CM | POA: Insufficient documentation

## 2017-03-06 DIAGNOSIS — D7389 Other diseases of spleen: Secondary | ICD-10-CM | POA: Diagnosis not present

## 2017-03-06 DIAGNOSIS — R938 Abnormal findings on diagnostic imaging of other specified body structures: Secondary | ICD-10-CM | POA: Diagnosis not present

## 2017-03-06 DIAGNOSIS — K838 Other specified diseases of biliary tract: Secondary | ICD-10-CM

## 2017-03-06 LAB — CREATININE, SERUM
CREATININE: 0.79 mg/dL (ref 0.44–1.00)
GFR calc Af Amer: >60 mL/min (ref >60)
GFR calc non Af Amer: >60 mL/min (ref >60)

## 2017-03-06 MED ORDER — GADOBENATE DIMEGLUMINE 529 MG/ML IV SOLN
15.0000 mL | Freq: Once | INTRAVENOUS | Status: AC
  Administered 2017-03-06: 14 mL via INTRAVENOUS

## 2017-03-17 ENCOUNTER — Telehealth: Payer: Self-pay

## 2017-03-17 NOTE — Progress Notes (Signed)
Please let patient know her MRCP overall looked good. Minimally dilated bile ducts, can be seen with age and with pain medications. No obvious abnormalities. Stable splenic lesions.   I still need to review labs from PCP.   Once labs received and reviewed, next step to be determined.

## 2017-03-17 NOTE — Telephone Encounter (Signed)
See result note.  

## 2017-03-17 NOTE — Telephone Encounter (Signed)
Pt is wanting to know about her MRI results.

## 2017-03-24 ENCOUNTER — Ambulatory Visit (INDEPENDENT_AMBULATORY_CARE_PROVIDER_SITE_OTHER): Payer: PPO | Admitting: *Deleted

## 2017-03-24 DIAGNOSIS — I480 Paroxysmal atrial fibrillation: Secondary | ICD-10-CM

## 2017-03-24 NOTE — Progress Notes (Signed)
Carelink Summary Report / Loop Recorder 

## 2017-03-27 ENCOUNTER — Other Ambulatory Visit: Payer: Self-pay | Admitting: Internal Medicine

## 2017-03-28 LAB — CUP PACEART REMOTE DEVICE CHECK
Implantable Pulse Generator Implant Date: 20160126
MDC IDC SESS DTM: 20180924104037

## 2017-04-04 ENCOUNTER — Ambulatory Visit: Payer: PPO | Admitting: Gastroenterology

## 2017-04-16 DIAGNOSIS — I10 Essential (primary) hypertension: Secondary | ICD-10-CM | POA: Diagnosis not present

## 2017-04-16 DIAGNOSIS — A045 Campylobacter enteritis: Secondary | ICD-10-CM | POA: Diagnosis not present

## 2017-04-16 DIAGNOSIS — E119 Type 2 diabetes mellitus without complications: Secondary | ICD-10-CM | POA: Diagnosis not present

## 2017-04-16 DIAGNOSIS — I4891 Unspecified atrial fibrillation: Secondary | ICD-10-CM | POA: Diagnosis not present

## 2017-04-16 DIAGNOSIS — Z23 Encounter for immunization: Secondary | ICD-10-CM | POA: Diagnosis not present

## 2017-04-18 ENCOUNTER — Other Ambulatory Visit: Payer: Self-pay | Admitting: Internal Medicine

## 2017-04-18 NOTE — Telephone Encounter (Signed)
Please keep upcoming appointment for future refills  

## 2017-04-23 ENCOUNTER — Ambulatory Visit (INDEPENDENT_AMBULATORY_CARE_PROVIDER_SITE_OTHER): Payer: PPO | Admitting: *Deleted

## 2017-04-23 DIAGNOSIS — K219 Gastro-esophageal reflux disease without esophagitis: Secondary | ICD-10-CM | POA: Diagnosis not present

## 2017-04-23 DIAGNOSIS — I480 Paroxysmal atrial fibrillation: Secondary | ICD-10-CM

## 2017-04-23 DIAGNOSIS — I10 Essential (primary) hypertension: Secondary | ICD-10-CM | POA: Diagnosis not present

## 2017-04-23 DIAGNOSIS — E119 Type 2 diabetes mellitus without complications: Secondary | ICD-10-CM | POA: Diagnosis not present

## 2017-04-23 DIAGNOSIS — J019 Acute sinusitis, unspecified: Secondary | ICD-10-CM | POA: Diagnosis not present

## 2017-04-23 NOTE — Progress Notes (Signed)
Carelink Summary Report / Loop Recorder 

## 2017-04-24 LAB — CUP PACEART REMOTE DEVICE CHECK
MDC IDC PG IMPLANT DT: 20160126
MDC IDC SESS DTM: 20181024121257

## 2017-05-26 ENCOUNTER — Ambulatory Visit (INDEPENDENT_AMBULATORY_CARE_PROVIDER_SITE_OTHER): Payer: PPO | Admitting: *Deleted

## 2017-05-26 DIAGNOSIS — I480 Paroxysmal atrial fibrillation: Secondary | ICD-10-CM | POA: Diagnosis not present

## 2017-05-26 NOTE — Progress Notes (Signed)
Carelink Summary Report / Loop Recorder 

## 2017-06-02 DIAGNOSIS — L309 Dermatitis, unspecified: Secondary | ICD-10-CM | POA: Diagnosis not present

## 2017-06-02 DIAGNOSIS — C44519 Basal cell carcinoma of skin of other part of trunk: Secondary | ICD-10-CM | POA: Diagnosis not present

## 2017-06-02 DIAGNOSIS — L308 Other specified dermatitis: Secondary | ICD-10-CM | POA: Diagnosis not present

## 2017-06-02 DIAGNOSIS — C44629 Squamous cell carcinoma of skin of left upper limb, including shoulder: Secondary | ICD-10-CM | POA: Diagnosis not present

## 2017-06-02 DIAGNOSIS — D485 Neoplasm of uncertain behavior of skin: Secondary | ICD-10-CM | POA: Diagnosis not present

## 2017-06-02 DIAGNOSIS — D225 Melanocytic nevi of trunk: Secondary | ICD-10-CM | POA: Diagnosis not present

## 2017-06-02 DIAGNOSIS — L570 Actinic keratosis: Secondary | ICD-10-CM | POA: Diagnosis not present

## 2017-06-05 LAB — CUP PACEART REMOTE DEVICE CHECK
Implantable Pulse Generator Implant Date: 20160126
MDC IDC SESS DTM: 20181123124047

## 2017-06-10 NOTE — Progress Notes (Signed)
Never received labs from PCP. Please let patient know she needs CBC, LFTs, ferritin done and OV follow up.

## 2017-06-11 ENCOUNTER — Other Ambulatory Visit: Payer: Self-pay

## 2017-06-11 DIAGNOSIS — R112 Nausea with vomiting, unspecified: Secondary | ICD-10-CM

## 2017-06-16 ENCOUNTER — Other Ambulatory Visit: Payer: Self-pay | Admitting: Internal Medicine

## 2017-06-16 ENCOUNTER — Ambulatory Visit: Payer: PPO | Admitting: Internal Medicine

## 2017-06-16 ENCOUNTER — Encounter: Payer: Self-pay | Admitting: Internal Medicine

## 2017-06-16 VITALS — BP 140/72 | HR 59 | Ht 65.0 in | Wt 170.6 lb

## 2017-06-16 DIAGNOSIS — I480 Paroxysmal atrial fibrillation: Secondary | ICD-10-CM

## 2017-06-16 LAB — CUP PACEART INCLINIC DEVICE CHECK
Date Time Interrogation Session: 20181217140822
Implantable Pulse Generator Implant Date: 20160126

## 2017-06-16 NOTE — Patient Instructions (Signed)
Medication Instructions:  Your physician recommends that you continue on your current medications as directed. Please refer to the Current Medication list given to you today.   Labwork: None ordered   Testing/Procedures: None ordered   Follow-Up: Your physician wants you to follow-up in: 12 months with Renee Ursuy, PA You will receive a reminder letter in the mail two months in advance. If you don't receive a letter, please call our office to schedule the follow-up appointment.       Any Other Special Instructions Will Be Listed Below (If Applicable).     If you need a refill on your cardiac medications before your next appointment, please call your pharmacy.   

## 2017-06-16 NOTE — Progress Notes (Signed)
PCP: Sinda Du, MD   Primary EP: Dr Talbot Grumbling RODOLFO NOTARO is a 71 y.o. female who presents today for routine electrophysiology followup.  Since last being seen in our clinic, the patient reports doing very well.  Today, she denies symptoms of palpitations, chest pain, shortness of breath,  lower extremity edema, dizziness, presyncope, or syncope.  The patient is otherwise without complaint today.   Past Medical History:  Diagnosis Date  . Arthritis   . Atrial flutter ([place])    s/p CTI by [name] 07/16/2013  . Diabetes mellitus    Borderline  . Essential hypertension, benign   . GERD (gastroesophageal reflux disease)   . Hemorrhoids   . Hyperlipidemia   . Multinodular goiter   . Osteoarthritis   . Palpitations   . Paroxysmal atrial fibrillation (HCC)    s/p PVI by [name] 07/16/2013   Past Surgical History:  Procedure Laterality Date  . ATRIAL FIBRILLATION ABLATION N/A 07/16/2013   PVI and CTI ablation by [name]  . BIOPSY THYROID    . COLONOSCOPY  09/10/2011   Dr. Arnoldo Morale: normal, 10 year follow up.  . LESION REMOVAL N/A 05/03/2013   Procedure: EXCISION CYST, BACK;  Surgeon: Jamesetta So, MD;  Location: [facility];  Service: General;  Laterality: N/A;  . LOOP RECORDER IMPLANT N/A 07/26/2014   Procedure: LOOP RECORDER IMPLANT;  Surgeon: Thompson Grayer, MD;  Location: [facility] CATH LAB;  Service: Cardiovascular;  Laterality: N/A;  . TEE WITHOUT CARDIOVERSION N/A 07/15/2013   Procedure: TRANSESOPHAGEAL ECHOCARDIOGRAM (TEE);  Surgeon: Lelon Perla, MD;  Location: [facility] ENDOSCOPY;  Service: Cardiovascular;  Laterality: N/A;  . TONSILLECTOMY    . TRANSANAL HEMORRHOIDAL DEARTERIALIZATION N/A 09/22/2015   Procedure: TRANSANAL HEMORRHOIDAL LIGATION/PEXY EUA POSSIBLE HEMORRHOIDECTOMY ;  Surgeon: Michael Boston, MD;  Location: [facility];  Service: General;  Laterality: N/A;    ROS- all systems are reviewed and negatives except as per HPI above  Current Outpatient Medications  Medication Sig  Dispense Refill  . apixaban (ELIQUIS) 5 MG TABS tablet Take 1 tablet (5 mg total) by mouth 2 (two) times daily. 60 tablet 12  . esomeprazole (NEXIUM) 20 MG capsule Take 20 mg by mouth daily.     . hydrochlorothiazide 25 MG tablet Take 25 mg by mouth daily.      Marland Kitchen HYDROcodone-acetaminophen (NORCO) 7.5-325 MG tablet Take 1-2 tablets by mouth every 4 (four) hours as needed for moderate pain or severe pain. 50 tablet 0  . lisinopril (PRINIVIL,ZESTRIL) 20 MG tablet Take 1 tablet (20 mg total) by mouth daily. 30 tablet 11  . metFORMIN (GLUCOPHAGE) 500 MG tablet Take 1 tablet (500 mg total) by mouth daily.    . metoprolol succinate (TOPROL-XL) 25 MG 24 hr tablet TAKE ONE-HALF TABLET BY MOUTH DAILY 45 tablet 2  . Potassium Gluconate 550 MG TABS Take 550 mg by mouth daily.     . simvastatin (ZOCOR) 40 MG tablet Take 40 mg by mouth at bedtime.       No current facility-administered medications for this visit.     Physical Exam: Vitals:   06/16/17 1048  BP: 140/72  Pulse: (!) 59  SpO2: 94%  Weight: 170 lb 9.6 oz (77.4 kg)  Height: 5\' 5"  (1.651 m)    GEN- The patient is well appearing, alert and oriented x 3 today.   Head- normocephalic, atraumatic Eyes-  Sclera clear, conjunctiva pink Ears- hearing intact Oropharynx- clear Lungs- Clear to ausculation bilaterally, normal work of  breathing Heart- Regular rate and rhythm, no murmurs, rubs or gallops, PMI not laterally displaced GI- soft, NT, ND, + BS Extremities- no clubbing, cyanosis, or edema  ILR interrogation ordered today is personally reviewed and shows well controlled AF  Assessment and Plan:  1. afib Well controlled off AAD therapy post ablation No afib by ILR interrogation,  Only a single episode of PACs, nonsustained atrial tach On eliquis (chads2vasc score is 4)  2. HTN Stable No change required today  3. Nocturnal pauses None in past year  [facility] Return to see EP PA in a year  Thompson Grayer MD,  [facility] 06/16/2017 11:11 AM

## 2017-06-23 ENCOUNTER — Ambulatory Visit (INDEPENDENT_AMBULATORY_CARE_PROVIDER_SITE_OTHER): Payer: PPO | Admitting: *Deleted

## 2017-06-23 DIAGNOSIS — I480 Paroxysmal atrial fibrillation: Secondary | ICD-10-CM

## 2017-06-25 NOTE — Progress Notes (Signed)
Carelink Summary Report / Loop Recorder 

## 2017-07-03 DIAGNOSIS — M542 Cervicalgia: Secondary | ICD-10-CM | POA: Diagnosis not present

## 2017-07-03 DIAGNOSIS — M9901 Segmental and somatic dysfunction of cervical region: Secondary | ICD-10-CM | POA: Diagnosis not present

## 2017-07-03 LAB — CUP PACEART REMOTE DEVICE CHECK
Implantable Pulse Generator Implant Date: 20160126
MDC IDC SESS DTM: 20181223124034

## 2017-07-04 DIAGNOSIS — M542 Cervicalgia: Secondary | ICD-10-CM | POA: Diagnosis not present

## 2017-07-04 DIAGNOSIS — M9901 Segmental and somatic dysfunction of cervical region: Secondary | ICD-10-CM | POA: Diagnosis not present

## 2017-07-22 ENCOUNTER — Ambulatory Visit (INDEPENDENT_AMBULATORY_CARE_PROVIDER_SITE_OTHER): Payer: PPO | Admitting: *Deleted

## 2017-07-22 DIAGNOSIS — I480 Paroxysmal atrial fibrillation: Secondary | ICD-10-CM | POA: Diagnosis not present

## 2017-07-22 DIAGNOSIS — I4891 Unspecified atrial fibrillation: Secondary | ICD-10-CM | POA: Diagnosis not present

## 2017-07-22 DIAGNOSIS — Z79891 Long term (current) use of opiate analgesic: Secondary | ICD-10-CM | POA: Diagnosis not present

## 2017-07-22 DIAGNOSIS — I10 Essential (primary) hypertension: Secondary | ICD-10-CM | POA: Diagnosis not present

## 2017-07-22 DIAGNOSIS — K219 Gastro-esophageal reflux disease without esophagitis: Secondary | ICD-10-CM | POA: Diagnosis not present

## 2017-07-22 DIAGNOSIS — E119 Type 2 diabetes mellitus without complications: Secondary | ICD-10-CM | POA: Diagnosis not present

## 2017-07-22 NOTE — Progress Notes (Signed)
Carelink Summary Report / Loop Recorder 

## 2017-07-23 DIAGNOSIS — M542 Cervicalgia: Secondary | ICD-10-CM | POA: Diagnosis not present

## 2017-07-23 DIAGNOSIS — M9901 Segmental and somatic dysfunction of cervical region: Secondary | ICD-10-CM | POA: Diagnosis not present

## 2017-07-24 DIAGNOSIS — M9901 Segmental and somatic dysfunction of cervical region: Secondary | ICD-10-CM | POA: Diagnosis not present

## 2017-07-24 DIAGNOSIS — M542 Cervicalgia: Secondary | ICD-10-CM | POA: Diagnosis not present

## 2017-07-31 DIAGNOSIS — D0461 Carcinoma in situ of skin of right upper limb, including shoulder: Secondary | ICD-10-CM | POA: Diagnosis not present

## 2017-07-31 DIAGNOSIS — L089 Local infection of the skin and subcutaneous tissue, unspecified: Secondary | ICD-10-CM | POA: Diagnosis not present

## 2017-07-31 DIAGNOSIS — C44629 Squamous cell carcinoma of skin of left upper limb, including shoulder: Secondary | ICD-10-CM | POA: Diagnosis not present

## 2017-07-31 DIAGNOSIS — C44622 Squamous cell carcinoma of skin of right upper limb, including shoulder: Secondary | ICD-10-CM | POA: Diagnosis not present

## 2017-07-31 LAB — CUP PACEART REMOTE DEVICE CHECK
Implantable Pulse Generator Implant Date: 20160126
MDC IDC SESS DTM: 20190122144327

## 2017-08-14 DIAGNOSIS — C44519 Basal cell carcinoma of skin of other part of trunk: Secondary | ICD-10-CM | POA: Diagnosis not present

## 2017-08-25 ENCOUNTER — Ambulatory Visit (INDEPENDENT_AMBULATORY_CARE_PROVIDER_SITE_OTHER): Payer: PPO | Admitting: *Deleted

## 2017-08-25 DIAGNOSIS — I495 Sick sinus syndrome: Secondary | ICD-10-CM | POA: Diagnosis not present

## 2017-08-25 DIAGNOSIS — L259 Unspecified contact dermatitis, unspecified cause: Secondary | ICD-10-CM | POA: Diagnosis not present

## 2017-08-25 NOTE — Progress Notes (Signed)
Carelink Summary Report / Loop Recorder 

## 2017-08-26 DIAGNOSIS — F419 Anxiety disorder, unspecified: Secondary | ICD-10-CM | POA: Diagnosis not present

## 2017-08-26 DIAGNOSIS — R109 Unspecified abdominal pain: Secondary | ICD-10-CM | POA: Diagnosis not present

## 2017-08-26 DIAGNOSIS — I10 Essential (primary) hypertension: Secondary | ICD-10-CM | POA: Diagnosis not present

## 2017-08-26 DIAGNOSIS — K219 Gastro-esophageal reflux disease without esophagitis: Secondary | ICD-10-CM | POA: Diagnosis not present

## 2017-08-27 DIAGNOSIS — R109 Unspecified abdominal pain: Secondary | ICD-10-CM | POA: Diagnosis not present

## 2017-08-27 DIAGNOSIS — K219 Gastro-esophageal reflux disease without esophagitis: Secondary | ICD-10-CM | POA: Diagnosis not present

## 2017-08-27 DIAGNOSIS — F419 Anxiety disorder, unspecified: Secondary | ICD-10-CM | POA: Diagnosis not present

## 2017-08-27 DIAGNOSIS — I10 Essential (primary) hypertension: Secondary | ICD-10-CM | POA: Diagnosis not present

## 2017-08-28 ENCOUNTER — Other Ambulatory Visit (HOSPITAL_COMMUNITY): Payer: Self-pay | Admitting: Pulmonary Disease

## 2017-08-28 DIAGNOSIS — R1030 Lower abdominal pain, unspecified: Secondary | ICD-10-CM

## 2017-08-28 DIAGNOSIS — R197 Diarrhea, unspecified: Secondary | ICD-10-CM

## 2017-09-02 ENCOUNTER — Ambulatory Visit (HOSPITAL_COMMUNITY)
Admission: RE | Admit: 2017-09-02 | Discharge: 2017-09-02 | Disposition: A | Discharge status: Home / Self Care | Payer: PPO | Source: Ambulatory Visit | Attending: Pulmonary Disease | Admitting: Pulmonary Disease

## 2017-09-02 DIAGNOSIS — D259 Leiomyoma of uterus, unspecified: Secondary | ICD-10-CM | POA: Insufficient documentation

## 2017-09-02 DIAGNOSIS — R197 Diarrhea, unspecified: Secondary | ICD-10-CM | POA: Diagnosis not present

## 2017-09-02 DIAGNOSIS — I700 Atherosclerosis of aorta: Secondary | ICD-10-CM | POA: Insufficient documentation

## 2017-09-02 DIAGNOSIS — R1030 Lower abdominal pain, unspecified: Secondary | ICD-10-CM | POA: Insufficient documentation

## 2017-09-02 DIAGNOSIS — R109 Unspecified abdominal pain: Secondary | ICD-10-CM | POA: Diagnosis not present

## 2017-09-02 LAB — POCT I-STAT CREATININE: CREATININE: 0.8 mg/dL (ref 0.44–1.00)

## 2017-09-02 MED ORDER — IOPAMIDOL (ISOVUE-300) INJECTION 61%
100.0000 mL | Freq: Once | INTRAVENOUS | Status: AC | PRN
  Administered 2017-09-02: 100 mL via INTRAVENOUS

## 2017-09-08 DIAGNOSIS — I10 Essential (primary) hypertension: Secondary | ICD-10-CM | POA: Diagnosis not present

## 2017-09-08 DIAGNOSIS — R110 Nausea: Secondary | ICD-10-CM | POA: Diagnosis not present

## 2017-09-08 DIAGNOSIS — E119 Type 2 diabetes mellitus without complications: Secondary | ICD-10-CM | POA: Diagnosis not present

## 2017-09-08 DIAGNOSIS — J0100 Acute maxillary sinusitis, unspecified: Secondary | ICD-10-CM | POA: Diagnosis not present

## 2017-09-12 ENCOUNTER — Other Ambulatory Visit: Payer: Self-pay

## 2017-09-12 ENCOUNTER — Telehealth: Payer: Self-pay

## 2017-09-12 MED ORDER — APIXABAN 5 MG PO TABS: 5.0000 mg | ORAL_TABLET | Freq: Two times a day (BID) | ORAL | 3 refills | Status: DC

## 2017-09-12 NOTE — Telephone Encounter (Signed)
**Note De-Identified  Obfuscation** The pt sent in her [employer] pt assistance application for Eliquis. She did not include her 3% out of pocket expense report from her pharmacy for the year 2019 or her proof of income for the year 2018.   I called her and made her aware that [employer] may require these documents before they can make a decision and that if they do they will mail her a letter telling her so. I have advised her to watch her mail because [employer] will not notify me unless they need something more from the providers part.  She verbalized understanding and is in agreement with me faxing her application in without those documents.  I have completed the provider part of the application, printed an Eliquis RX and placed both in Dr Bonita Quin mail bin awaiting his signature.

## 2017-09-17 NOTE — Telephone Encounter (Signed)
[name] has signed the pts [employer] Pt assistance application and Eliquis RX. I have faxed all to [employer].

## 2017-09-22 ENCOUNTER — Telehealth: Payer: Self-pay | Admitting: *Deleted

## 2017-09-22 NOTE — Telephone Encounter (Signed)
LINQ at RRT as of 09/17/17.  Spoke with patient regarding options at this point.  She would like to discuss with Dr. Rayann Heman prior to making a decision.  Patient is agreeable to an appointment with Dr. Rayann Heman on 10/13/17 at 0900.  She will plan to unplug home monitor today and await return kit from [employer].  Patient is appreciative of call and denies additional questions or concerns at this time.  Unenrolled from [employer], return kit ordered.

## 2017-09-29 LAB — CUP PACEART REMOTE DEVICE CHECK
MDC IDC PG IMPLANT DT: 20160126
MDC IDC SESS DTM: 20190224184031

## 2017-10-13 ENCOUNTER — Ambulatory Visit: Payer: PPO | Admitting: Internal Medicine

## 2017-10-13 ENCOUNTER — Encounter: Payer: Self-pay | Admitting: Internal Medicine

## 2017-10-13 VITALS — BP 118/68 | HR 50 | Ht 65.0 in | Wt 162.0 lb

## 2017-10-13 DIAGNOSIS — I10 Essential (primary) hypertension: Secondary | ICD-10-CM

## 2017-10-13 DIAGNOSIS — M179 Osteoarthritis of knee, unspecified: Secondary | ICD-10-CM | POA: Diagnosis not present

## 2017-10-13 DIAGNOSIS — E785 Hyperlipidemia, unspecified: Secondary | ICD-10-CM | POA: Diagnosis not present

## 2017-10-13 DIAGNOSIS — I4891 Unspecified atrial fibrillation: Secondary | ICD-10-CM | POA: Diagnosis not present

## 2017-10-13 DIAGNOSIS — I480 Paroxysmal atrial fibrillation: Secondary | ICD-10-CM

## 2017-10-13 NOTE — Progress Notes (Signed)
PCP: Sinda Du, MD   Primary EP: Dr Talbot Grumbling Erika Lee is a 72 y.o. female who presents today for routine electrophysiology followup.  Since last being seen in our clinic, the patient reports doing very well.  Today, she denies symptoms of palpitations, chest pain, shortness of breath,  lower extremity edema, dizziness, presyncope, or syncope.  The patient is otherwise without complaint today.   Past Medical History:  Diagnosis Date  . Arthritis   . Atrial flutter ([place])    s/p CTI by [name] 07/16/2013  . Diabetes mellitus    Borderline  . Essential hypertension, benign   . GERD (gastroesophageal reflux disease)   . Hemorrhoids   . Hyperlipidemia   . Multinodular goiter   . Osteoarthritis   . Palpitations   . Paroxysmal atrial fibrillation (HCC)    s/p PVI by [name] 07/16/2013   Past Surgical History:  Procedure Laterality Date  . ATRIAL FIBRILLATION ABLATION N/A 07/16/2013   PVI and CTI ablation by [name]  . BIOPSY THYROID    . COLONOSCOPY  09/10/2011   Dr. Arnoldo Morale: normal, 10 year follow up.  . LESION REMOVAL N/A 05/03/2013   Procedure: EXCISION CYST, BACK;  Surgeon: Jamesetta So, MD;  Location: [facility];  Service: General;  Laterality: N/A;  . LOOP RECORDER IMPLANT N/A 07/26/2014   Procedure: LOOP RECORDER IMPLANT;  Surgeon: Thompson Grayer, MD;  Location: [facility] CATH LAB;  Service: Cardiovascular;  Laterality: N/A;  . TEE WITHOUT CARDIOVERSION N/A 07/15/2013   Procedure: TRANSESOPHAGEAL ECHOCARDIOGRAM (TEE);  Surgeon: Lelon Perla, MD;  Location: [facility] ENDOSCOPY;  Service: Cardiovascular;  Laterality: N/A;  . TONSILLECTOMY    . TRANSANAL HEMORRHOIDAL DEARTERIALIZATION N/A 09/22/2015   Procedure: TRANSANAL HEMORRHOIDAL LIGATION/PEXY EUA POSSIBLE HEMORRHOIDECTOMY ;  Surgeon: Michael Boston, MD;  Location: [facility];  Service: General;  Laterality: N/A;    ROS- all systems are reviewed and negatives except as per HPI above  Current Outpatient Medications  Medication Sig  Dispense Refill  . apixaban (ELIQUIS) 5 MG TABS tablet Take 1 tablet (5 mg total) by mouth 2 (two) times daily. 180 tablet 3  . esomeprazole (NEXIUM) 20 MG capsule Take 20 mg by mouth daily.     . hydrochlorothiazide 25 MG tablet Take 25 mg by mouth daily.      Marland Kitchen HYDROcodone-acetaminophen (NORCO) 7.5-325 MG tablet Take 1-2 tablets by mouth every 4 (four) hours as needed for moderate pain or severe pain. 50 tablet 0  . lisinopril (PRINIVIL,ZESTRIL) 20 MG tablet TAKE 1 TABLET BY MOUTH DAILY 30 tablet 11  . metFORMIN (GLUCOPHAGE) 500 MG tablet Take 1 tablet (500 mg total) by mouth daily.    . metoprolol succinate (TOPROL-XL) 25 MG 24 hr tablet TAKE ONE-HALF TABLET BY MOUTH DAILY 45 tablet 2  . Potassium Gluconate 550 MG TABS Take 550 mg by mouth daily.     . simvastatin (ZOCOR) 40 MG tablet Take 40 mg by mouth at bedtime.       No current facility-administered medications for this visit.     Physical Exam: Vitals:   10/13/17 0913  BP: 118/68  Pulse: (!) 50  Weight: 162 lb (73.5 kg)  Height: 5\' 5"  (1.651 m)    GEN- The patient is well appearing, alert and oriented x 3 today.   Head- normocephalic, atraumatic Eyes-  Sclera clear, conjunctiva pink Ears- hearing intact Oropharynx- clear Lungs- Clear to ausculation bilaterally, normal work of breathing Heart- Regular rate and rhythm, no murmurs,  rubs or gallops, PMI not laterally displaced GI- soft, NT, ND, + BS Extremities- no clubbing, cyanosis, or edema  EKG tracing ordered today is personally reviewed and shows sinus bradycardia 50 bpm, PR 172 msec, QRS 100 msec, Qtc 404 msec  Assessment and Plan:  1. afib No episodes since last interrogation by ILR off AAD therapy post ablation On eliquis (Chads2vasc score is 4) her ILR has reached RRT.  We discussed options of reimplantation, removal, etc today at length.  She is very clear that she would like to have another ILR placed for afib management.  Risks and benefits to this approach  were discussed at length. She also worries about long term risks of anticoagulation.  I have advised that since she has not been having afib, a reasonable approach would be to stop anticoagulation and resume if AF is detected by ILR.  She will consider this option.  2. HTN Stable No change required today  ILR removal and replacement later this week Return to see me in the office in 3 months  Thompson Grayer MD, [facility] 10/13/2017 9:33 AM

## 2017-10-13 NOTE — H&P (View-Only) (Signed)
PCP: Sinda Du, MD   Primary EP: Dr Talbot Grumbling Erika Lee is a 72 y.o. female who presents today for routine electrophysiology followup.  Since last being seen in our clinic, the patient reports doing very well.  Today, she denies symptoms of palpitations, chest pain, shortness of breath,  lower extremity edema, dizziness, presyncope, or syncope.  The patient is otherwise without complaint today.   Past Medical History:  Diagnosis Date  . Arthritis   . Atrial flutter ([place])    s/p CTI by [name] 07/16/2013  . Diabetes mellitus    Borderline  . Essential hypertension, benign   . GERD (gastroesophageal reflux disease)   . Hemorrhoids   . Hyperlipidemia   . Multinodular goiter   . Osteoarthritis   . Palpitations   . Paroxysmal atrial fibrillation (HCC)    s/p PVI by [name] 07/16/2013   Past Surgical History:  Procedure Laterality Date  . ATRIAL FIBRILLATION ABLATION N/A 07/16/2013   PVI and CTI ablation by [name]  . BIOPSY THYROID    . COLONOSCOPY  09/10/2011   Dr. Arnoldo Morale: normal, 10 year follow up.  . LESION REMOVAL N/A 05/03/2013   Procedure: EXCISION CYST, BACK;  Surgeon: Jamesetta So, MD;  Location: [facility];  Service: General;  Laterality: N/A;  . LOOP RECORDER IMPLANT N/A 07/26/2014   Procedure: LOOP RECORDER IMPLANT;  Surgeon: Thompson Grayer, MD;  Location: [facility] CATH LAB;  Service: Cardiovascular;  Laterality: N/A;  . TEE WITHOUT CARDIOVERSION N/A 07/15/2013   Procedure: TRANSESOPHAGEAL ECHOCARDIOGRAM (TEE);  Surgeon: Lelon Perla, MD;  Location: [facility] ENDOSCOPY;  Service: Cardiovascular;  Laterality: N/A;  . TONSILLECTOMY    . TRANSANAL HEMORRHOIDAL DEARTERIALIZATION N/A 09/22/2015   Procedure: TRANSANAL HEMORRHOIDAL LIGATION/PEXY EUA POSSIBLE HEMORRHOIDECTOMY ;  Surgeon: Michael Boston, MD;  Location: [facility];  Service: General;  Laterality: N/A;    ROS- all systems are reviewed and negatives except as per HPI above  Current Outpatient Medications  Medication Sig  Dispense Refill  . apixaban (ELIQUIS) 5 MG TABS tablet Take 1 tablet (5 mg total) by mouth 2 (two) times daily. 180 tablet 3  . esomeprazole (NEXIUM) 20 MG capsule Take 20 mg by mouth daily.     . hydrochlorothiazide 25 MG tablet Take 25 mg by mouth daily.      Marland Kitchen HYDROcodone-acetaminophen (NORCO) 7.5-325 MG tablet Take 1-2 tablets by mouth every 4 (four) hours as needed for moderate pain or severe pain. 50 tablet 0  . lisinopril (PRINIVIL,ZESTRIL) 20 MG tablet TAKE 1 TABLET BY MOUTH DAILY 30 tablet 11  . metFORMIN (GLUCOPHAGE) 500 MG tablet Take 1 tablet (500 mg total) by mouth daily.    . metoprolol succinate (TOPROL-XL) 25 MG 24 hr tablet TAKE ONE-HALF TABLET BY MOUTH DAILY 45 tablet 2  . Potassium Gluconate 550 MG TABS Take 550 mg by mouth daily.     . simvastatin (ZOCOR) 40 MG tablet Take 40 mg by mouth at bedtime.       No current facility-administered medications for this visit.     Physical Exam: Vitals:   10/13/17 0913  BP: 118/68  Pulse: (!) 50  Weight: 162 lb (73.5 kg)  Height: 5\' 5"  (1.651 m)    GEN- The patient is well appearing, alert and oriented x 3 today.   Head- normocephalic, atraumatic Eyes-  Sclera clear, conjunctiva pink Ears- hearing intact Oropharynx- clear Lungs- Clear to ausculation bilaterally, normal work of breathing Heart- Regular rate and rhythm, no murmurs,  rubs or gallops, PMI not laterally displaced GI- soft, NT, ND, + BS Extremities- no clubbing, cyanosis, or edema  EKG tracing ordered today is personally reviewed and shows sinus bradycardia 50 bpm, PR 172 msec, QRS 100 msec, Qtc 404 msec  Assessment and Plan:  1. afib No episodes since last interrogation by ILR off AAD therapy post ablation On eliquis (Chads2vasc score is 4) her ILR has reached RRT.  We discussed options of reimplantation, removal, etc today at length.  She is very clear that she would like to have another ILR placed for afib management.  Risks and benefits to this approach  were discussed at length. She also worries about long term risks of anticoagulation.  I have advised that since she has not been having afib, a reasonable approach would be to stop anticoagulation and resume if AF is detected by ILR.  She will consider this option.  2. HTN Stable No change required today  ILR removal and replacement later this week Return to see me in the office in 3 months  Thompson Grayer MD, [facility] 10/13/2017 9:33 AM

## 2017-10-13 NOTE — Patient Instructions (Addendum)
Medication Instructions:  Your physician recommends that you continue on your current medications as directed. Please refer to the Current Medication list given to you today.  Labwork: None ordered.  Testing/Procedures: You are going to have your loop recorder removed and a new one placed.  Follow-Up:  You will follow up with the device clinic 10-14 days after your procedure for a wound check.  Your physician wants you to follow-up in: 3 months  with Dr. Rayann Heman.     Any Other Special Instructions Will Be Listed Below (If Applicable).  Please arrive at the [facility] at:   October 17, 2017 at 6:00 am  Hold your Eliquis on October 16, 2017 and morning of your procedure October 17, 2017  If you need a refill on your cardiac medications before your next appointment, please call your pharmacy.    Implantable Loop Recorder Placement An implantable loop recorder is a small electronic device that is placed under the skin of your chest. It is about the size of an AA ("double A") battery. The device records the electrical activity of your heart over a long period of time. Your health care provider can download these recordings to monitor your heart. You may need an implantable loop recorder if you have periods of abnormal heart activity (arrhythmias) or unexplained fainting (syncope) caused by a heart problem. Tell a health care provider about:  Any allergies you have.  All medicines you are taking, including vitamins, herbs, eye drops, creams, and over-the-counter medicines.  Any problems you or family members have had with anesthetic medicines.  Any blood disorders you have.  Any surgeries you have had.  Any medical conditions you have.  Whether you are pregnant or may be pregnant. What are the risks? Generally, this is a safe procedure. However, as with any procedure, problems may occur, including:  Infection.  Bleeding.  Allergic reactions  to anesthetic medicines.  Damage to nerves or blood vessels.  Failure of the device to work. This could require another surgery to replace it.  What happens before the procedure?   You may have a physical exam, blood tests, and imaging tests of your heart, such as a chest X-ray.  Follow instructions from your health care provider about eating or drinking restrictions.  Ask your health care provider about: ? Changing or stopping your regular medicines. This is especially important if you are taking diabetes medicines or blood thinners. ? Taking medicines such as aspirin and ibuprofen. These medicines can thin your blood. Do not take these medicines before your procedure if your surgeon instructs you not to.  Ask your health care provider how your surgical site will be marked or identified.  You may be given antibiotic medicine to help prevent infection.  Plan to have someone take you home after the procedure.  If you will be going home right after the procedure, plan to have someone with you for 24 hours.  Do not use any tobacco products, such as cigarettes, chewing tobacco, and e-cigarettes as told by your surgeon. If you need help quitting, ask your health care provider. What happens during the procedure?  To reduce your risk of infection: ? Your health care team will wash or sanitize their hands. ? Your skin will be washed with soap.  An IV tube will be inserted into one of your veins.  You may be given an antibiotic medicine through the IV tube.  You may be given one or more  of the following: ? A medicine to help you relax (sedative). ? A medicine to numb the area (local anesthetic).  A small cut (incision) will be made on the left side of your upper chest.  A pocket will be created under your skin.  The device will be placed in the pocket.  The incision will be closed with stitches (sutures) or adhesive strips.  A bandage (dressing) will be placed over the  incision. The procedure may vary among health care providers and hospitals. What happens after the procedure?  Your blood pressure, heart rate, breathing rate, and blood oxygen level will be monitored often until the medicines you were given have worn off.  You may be able to go home on the day of your surgery. Before going home: ? Your health care provider will program your recorder. ? You will learn how to trigger your device with a handheld activator. ? You will learn how to send recordings to your health care provider. ? You will get an ID card for your device, and you will be told when to use it.  Do not drive for 24 hours if you received a sedative. This information is not intended to replace advice given to you by your health care provider. Make sure you discuss any questions you have with your health care provider. Document Released: 05/29/2015 Document Revised: 11/23/2015 Document Reviewed: 03/22/2015 Elsevier Interactive Patient Education  [employer].

## 2017-10-17 ENCOUNTER — Encounter (HOSPITAL_COMMUNITY): Payer: Self-pay | Admitting: Internal Medicine

## 2017-10-17 ENCOUNTER — Ambulatory Visit (HOSPITAL_COMMUNITY)
Admission: RE | Admit: 2017-10-17 | Discharge: 2017-10-17 | Disposition: A | Discharge status: Home / Self Care | Payer: PPO | Source: Ambulatory Visit | Attending: Internal Medicine | Admitting: Internal Medicine

## 2017-10-17 ENCOUNTER — Encounter (HOSPITAL_COMMUNITY)
Admission: RE | Disposition: A | Discharge status: Home / Self Care | Payer: Self-pay | Source: Ambulatory Visit | Attending: Internal Medicine

## 2017-10-17 DIAGNOSIS — M1990 Unspecified osteoarthritis, unspecified site: Secondary | ICD-10-CM | POA: Insufficient documentation

## 2017-10-17 DIAGNOSIS — I4892 Unspecified atrial flutter: Secondary | ICD-10-CM | POA: Diagnosis not present

## 2017-10-17 DIAGNOSIS — I10 Essential (primary) hypertension: Secondary | ICD-10-CM | POA: Insufficient documentation

## 2017-10-17 DIAGNOSIS — R7303 Prediabetes: Secondary | ICD-10-CM | POA: Insufficient documentation

## 2017-10-17 DIAGNOSIS — Z7984 Long term (current) use of oral hypoglycemic drugs: Secondary | ICD-10-CM | POA: Insufficient documentation

## 2017-10-17 DIAGNOSIS — Z7901 Long term (current) use of anticoagulants: Secondary | ICD-10-CM | POA: Insufficient documentation

## 2017-10-17 DIAGNOSIS — I480 Paroxysmal atrial fibrillation: Secondary | ICD-10-CM | POA: Insufficient documentation

## 2017-10-17 DIAGNOSIS — K219 Gastro-esophageal reflux disease without esophagitis: Secondary | ICD-10-CM | POA: Diagnosis not present

## 2017-10-17 DIAGNOSIS — Z4509 Encounter for adjustment and management of other cardiac device: Secondary | ICD-10-CM | POA: Diagnosis not present

## 2017-10-17 DIAGNOSIS — I4891 Unspecified atrial fibrillation: Secondary | ICD-10-CM | POA: Diagnosis not present

## 2017-10-17 DIAGNOSIS — R002 Palpitations: Secondary | ICD-10-CM | POA: Diagnosis not present

## 2017-10-17 DIAGNOSIS — E785 Hyperlipidemia, unspecified: Secondary | ICD-10-CM | POA: Insufficient documentation

## 2017-10-17 HISTORY — PX: LOOP RECORDER REMOVAL: EP1215

## 2017-10-17 HISTORY — PX: LOOP RECORDER INSERTION: EP1214

## 2017-10-17 LAB — GLUCOSE, CAPILLARY: GLUCOSE-CAPILLARY: 194 mg/dL — AB (ref 65–99)

## 2017-10-17 SURGERY — LOOP RECORDER REMOVAL

## 2017-10-17 MED ORDER — LIDOCAINE-EPINEPHRINE 1 %-1:100000 IJ SOLN
INTRAMUSCULAR | Status: DC | PRN
  Administered 2017-10-17: 25 mL

## 2017-10-17 MED ORDER — LIDOCAINE-EPINEPHRINE 1 %-1:100000 IJ SOLN
INTRAMUSCULAR | Status: AC
  Filled 2017-10-17: qty 1

## 2017-10-17 SURGICAL SUPPLY — 2 items
LOOP REVEAL LINQSYS (Prosthesis & Implant Heart) ×3 IMPLANT
PACK LOOP INSERTION (CUSTOM PROCEDURE TRAY) ×3 IMPLANT

## 2017-10-17 NOTE — Discharge Instructions (Signed)
Implantable Loop Recorder Placement, Care After  Refer to this sheet in the next few weeks. These instructions provide you with information about caring for yourself after your procedure. Your health care provider may also give you more specific instructions. Your treatment has been planned according to current medical practices, but problems sometimes occur. Call your health care provider if you have any problems or questions after your procedure.  What can I expect after the procedure?  After the procedure, it is common to have:   Soreness or pain near the cut from surgery (incision).   Some swelling or bruising near the incision.    Follow these instructions at home:  Medicines   Take over-the-counter and prescription medicines only as told by your health care provider.   If you were prescribed an antibiotic medicine, take it as told by your health care provider. Do not stop taking the antibiotic even if you start to feel better.  Bathing   Do not take baths, swim, or use a hot tub until your health care provider approves. Ask your health care provider if you can take showers. You may only be allowed to take sponge baths for bathing.  Incision care   Follow instructions from your health care provider about how to take care of your incision. Make sure you:  ? Wash your hands with soap and water before you change your bandage (dressing). If soap and water are not available, use hand sanitizer.  ? Change your dressing as told by your health care provider.  ? Keep your dressing dry.  ? Leave stitches (sutures), skin glue, or adhesive strips in place. These skin closures may need to stay in place for 2 weeks or longer. If adhesive strip edges start to loosen and curl up, you may trim the loose edges. Do not remove adhesive strips completely unless your health care provider tells you to do that.   Check your incision area every day for signs of infection. Check for:  ? More redness, swelling, or pain.  ? Fluid  or blood.  ? Warmth.  ? Pus or a bad smell.  Driving   If you received a sedative, do not drive for 24 hours after the procedure.   If you did not receive a sedative, ask your health care provider when it is safe to drive.  Activity   Return to your normal activities as told by your health care provider. Ask your health care provider what activities are safe for you.   Until your health care provider says it is safe:  ? Do not lift anything that is heavier than 10 lb (4.5 kg).  ? Do not do activities that involve lifting your arms over your head.  General instructions     Follow instructions from your health care provider about how and when to use your implantable loop recorder.   Do not go through a metal detection gate, and do not let someone hold a metal detector over your chest. Show your ID card.   Do not have an MRI unless you check with your health care provider first.   Do not use any tobacco products, such as cigarettes, chewing tobacco, and e-cigarettes. Tobacco can delay healing. If you need help quitting, ask your health care provider.   Keep all follow-up visits as told by your health care provider. This is important.  Contact a health care provider if:   You have more redness, swelling, or pain around your incision.     You have more fluid or blood coming from your incision.   Your incision feels warm to the touch.   You have pus or a bad smell coming from your incision.   You have a fever.   You have pain that is not relieved by your pain medicine.   You have triggered your device because of fainting (syncope) or because of a heartbeat that feels like it is racing, slow, fluttering, or skipping (palpitations).  Get help right away if:   You have chest pain.   You have difficulty breathing.  This information is not intended to replace advice given to you by your health care provider. Make sure you discuss any questions you have with your health care provider.  Document Released:  05/29/2015 Document Revised: 11/23/2015 Document Reviewed: 03/22/2015  Elsevier Interactive Patient Education  2018 [employer]vier Inc[employer]

## 2017-10-17 NOTE — Interval H&P Note (Signed)
History and Physical Interval Note:  10/17/2017 7:27 AM  Erika Lee  has presented today for surgery, with the diagnosis of afib  The various methods of treatment have been discussed with the patient and family. After consideration of risks, benefits and other options for treatment, the patient has consented to  Procedure(s): LOOP RECORDER REMOVAL (N/A) LOOP RECORDER INSERTION (N/A) as a surgical intervention .  The patient's history has been reviewed, patient examined, no change in status, stable for surgery.  I have reviewed the patient's chart and labs.  Questions were answered to the patient's satisfaction.    Her loop recorder is at relative replacement time.  She would like new ILR implanted for afib management post ablation.  Risks of the procedure was discussed at length with the patient who wishes to proceed.  Thompson Grayer

## 2017-10-29 ENCOUNTER — Ambulatory Visit (INDEPENDENT_AMBULATORY_CARE_PROVIDER_SITE_OTHER): Payer: Self-pay | Admitting: *Deleted

## 2017-10-29 DIAGNOSIS — I480 Paroxysmal atrial fibrillation: Secondary | ICD-10-CM

## 2017-10-29 LAB — CUP PACEART INCLINIC DEVICE CHECK
Implantable Pulse Generator Implant Date: 20190419
MDC IDC SESS DTM: 20190501153221

## 2017-10-29 NOTE — Progress Notes (Signed)
Wound Loop check in clinic. Steri-Strips removed, incision edges approximated wound well healed. Battery status: Good. R-waves 0.16mV. 0 symptom episodes, 0 tachy episodes, 0 pause episodes, 0 brady episodes.0  AF episodes. Monthly summary reports and ROV with JA 01/05/2018

## 2017-11-03 ENCOUNTER — Other Ambulatory Visit (HOSPITAL_COMMUNITY): Payer: Self-pay | Admitting: Gastroenterology

## 2017-11-03 DIAGNOSIS — R112 Nausea with vomiting, unspecified: Secondary | ICD-10-CM

## 2017-11-03 DIAGNOSIS — R1010 Upper abdominal pain, unspecified: Secondary | ICD-10-CM | POA: Diagnosis not present

## 2017-11-03 DIAGNOSIS — R140 Abdominal distension (gaseous): Secondary | ICD-10-CM | POA: Diagnosis not present

## 2017-11-04 ENCOUNTER — Telehealth: Payer: Self-pay

## 2017-11-04 NOTE — Telephone Encounter (Signed)
To pharm 

## 2017-11-04 NOTE — Telephone Encounter (Signed)
Pt takes Eliquis for afib with CHADS2VASc score of 4 (age, sex, HTN, DM). Renal function is normal. Ok to hold Eliquis for 1-2 days prior to procedure as needed.

## 2017-11-04 NOTE — Telephone Encounter (Signed)
   [facility] Pre-operative Risk Assessment    Request for surgical clearance:  1. What type of surgery is being performed? Endoscopy    2. When is this surgery scheduled? 11/27/17   3. What type of clearance is required (medical clearance vs. Pharmacy clearance to hold med vs. Both)? Both  4. Are there any medications that need to be held prior to surgery and how long? Eliquis prior to surgery   5. Practice name and name of physician performing surgery? [facility] - Dr. Therisa Doyne  6. What is your office phone number [phone]    7.   What is your office fax number [phone]  8.   Anesthesia type (None, local, MAC, general) ? None listed    Erika Lee 11/04/2017, 2:20 PM  _________________________________________________________________   (provider comments below)

## 2017-11-06 ENCOUNTER — Other Ambulatory Visit: Payer: Self-pay | Admitting: Internal Medicine

## 2017-11-06 NOTE — Telephone Encounter (Signed)
   Primary Cardiologist: Thompson Grayer, MD  Chart reviewed as part of pre-operative protocol coverage. Patient was contacted 11/06/2017 in reference to pre-operative risk assessment for pending surgery as outlined below.  Erika Lee was last seen on 10/13/17 by Dr. Rayann Heman.  Since that day, Erika Lee has done well. She had her loop recorder replaced due to expiration and this is healing well. She's had no symptoms of afib and no chest discomfort/dyspea.   Therefore, based on ACC/AHA guidelines, the patient would be at acceptable risk for the planned procedure without further cardiovascular testing.   See attached recommendations for anticoagulation.  I will route this recommendation to the requesting party via Epic fax function and remove from pre-op pool.  Please call with questions.  Daune Perch, NP 11/06/2017, 1:55 PM

## 2017-11-12 ENCOUNTER — Encounter (HOSPITAL_COMMUNITY)
Admission: RE | Admit: 2017-11-12 | Discharge: 2017-11-12 | Disposition: A | Discharge status: Home / Self Care | Payer: PPO | Source: Ambulatory Visit | Attending: Gastroenterology | Admitting: Gastroenterology

## 2017-11-12 DIAGNOSIS — R112 Nausea with vomiting, unspecified: Secondary | ICD-10-CM | POA: Insufficient documentation

## 2017-11-12 DIAGNOSIS — E119 Type 2 diabetes mellitus without complications: Secondary | ICD-10-CM | POA: Diagnosis not present

## 2017-11-12 MED ORDER — TECHNETIUM TC 99M SULFUR COLLOID
2.0000 | Freq: Once | INTRAVENOUS | Status: AC | PRN
  Administered 2017-11-12: 2 via ORAL

## 2017-11-14 ENCOUNTER — Encounter

## 2017-11-14 ENCOUNTER — Ambulatory Visit: Payer: PPO | Admitting: Internal Medicine

## 2017-11-19 ENCOUNTER — Ambulatory Visit (INDEPENDENT_AMBULATORY_CARE_PROVIDER_SITE_OTHER): Payer: PPO | Admitting: *Deleted

## 2017-11-19 DIAGNOSIS — I480 Paroxysmal atrial fibrillation: Secondary | ICD-10-CM | POA: Diagnosis not present

## 2017-11-20 NOTE — Progress Notes (Signed)
Carelink Summary Report / Loop Recorder 

## 2017-11-27 DIAGNOSIS — R1013 Epigastric pain: Secondary | ICD-10-CM | POA: Diagnosis not present

## 2017-11-27 DIAGNOSIS — K317 Polyp of stomach and duodenum: Secondary | ICD-10-CM | POA: Diagnosis not present

## 2017-11-27 DIAGNOSIS — K3189 Other diseases of stomach and duodenum: Secondary | ICD-10-CM | POA: Diagnosis not present

## 2017-11-27 DIAGNOSIS — K2930 Chronic superficial gastritis without bleeding: Secondary | ICD-10-CM | POA: Diagnosis not present

## 2017-11-27 DIAGNOSIS — R140 Abdominal distension (gaseous): Secondary | ICD-10-CM | POA: Diagnosis not present

## 2017-11-27 DIAGNOSIS — R112 Nausea with vomiting, unspecified: Secondary | ICD-10-CM | POA: Diagnosis not present

## 2017-12-04 DIAGNOSIS — K317 Polyp of stomach and duodenum: Secondary | ICD-10-CM | POA: Diagnosis not present

## 2017-12-04 DIAGNOSIS — K3189 Other diseases of stomach and duodenum: Secondary | ICD-10-CM | POA: Diagnosis not present

## 2017-12-08 DIAGNOSIS — R198 Other specified symptoms and signs involving the digestive system and abdomen: Secondary | ICD-10-CM | POA: Diagnosis not present

## 2017-12-08 DIAGNOSIS — K2950 Unspecified chronic gastritis without bleeding: Secondary | ICD-10-CM | POA: Diagnosis not present

## 2017-12-08 DIAGNOSIS — R110 Nausea: Secondary | ICD-10-CM | POA: Diagnosis not present

## 2017-12-08 DIAGNOSIS — R140 Abdominal distension (gaseous): Secondary | ICD-10-CM | POA: Diagnosis not present

## 2017-12-15 LAB — CUP PACEART REMOTE DEVICE CHECK
Implantable Pulse Generator Implant Date: 20190419
MDC IDC SESS DTM: 20190522143920

## 2017-12-22 ENCOUNTER — Ambulatory Visit (INDEPENDENT_AMBULATORY_CARE_PROVIDER_SITE_OTHER): Payer: PPO | Admitting: *Deleted

## 2017-12-22 DIAGNOSIS — I480 Paroxysmal atrial fibrillation: Secondary | ICD-10-CM | POA: Diagnosis not present

## 2017-12-22 NOTE — Progress Notes (Signed)
Carelink Summary Report / Loop Recorder 

## 2017-12-23 DIAGNOSIS — R110 Nausea: Secondary | ICD-10-CM | POA: Diagnosis not present

## 2017-12-26 ENCOUNTER — Encounter: Payer: Self-pay | Admitting: Internal Medicine

## 2018-01-05 ENCOUNTER — Ambulatory Visit: Payer: PPO | Admitting: Internal Medicine

## 2018-01-05 ENCOUNTER — Encounter: Payer: Self-pay | Admitting: Internal Medicine

## 2018-01-05 VITALS — BP 124/62 | HR 46 | Ht 65.0 in | Wt 159.0 lb

## 2018-01-05 DIAGNOSIS — I480 Paroxysmal atrial fibrillation: Secondary | ICD-10-CM | POA: Diagnosis not present

## 2018-01-05 DIAGNOSIS — I10 Essential (primary) hypertension: Secondary | ICD-10-CM | POA: Diagnosis not present

## 2018-01-05 NOTE — Progress Notes (Signed)
PCP: Sinda Du, MD   Primary EP: Dr Talbot Grumbling NYJA WESTBROOK is a 72 y.o. female who presents today for routine electrophysiology followup.  Since last being seen in our clinic, the patient reports doing very well.  Today, she denies symptoms of palpitations, chest pain, shortness of breath,  lower extremity edema, dizziness, presyncope, or syncope.  The patient is otherwise without complaint today.   Past Medical History:  Diagnosis Date  . Arthritis   . Atrial flutter ([place])    s/p CTI by [name] 07/16/2013  . Diabetes mellitus    Borderline  . Essential hypertension, benign   . GERD (gastroesophageal reflux disease)   . Hemorrhoids   . Hyperlipidemia   . Multinodular goiter   . Osteoarthritis   . Palpitations   . Paroxysmal atrial fibrillation ([facility])    s/p PVI by [name] 07/16/2013   Past Surgical History:  Procedure Laterality Date  . ATRIAL FIBRILLATION ABLATION N/A 07/16/2013   PVI and CTI ablation by [name]  . BIOPSY THYROID    . COLONOSCOPY  09/10/2011   Dr. Arnoldo Morale: normal, 10 year follow up.  . LESION REMOVAL N/A 05/03/2013   Procedure: EXCISION CYST, BACK;  Surgeon: Jamesetta So, MD;  Location: [facility];  Service: General;  Laterality: N/A;  . LOOP RECORDER IMPLANT N/A 07/26/2014   Procedure: LOOP RECORDER IMPLANT;  Surgeon: Thompson Grayer, MD;  Location[facility] CATH LAB;  Service: Cardiovascular;  Laterality: N/A;  . LOOP RECORDER INSERTION N/A 10/17/2017   Procedure: LOOP RECORDER INSERTION;  Surgeon: Thompson Grayer, MD;  Location: [place] CV LAB;  Service: Cardiovascular;  Laterality: N/A;  . LOOP RECORDER REMOVAL N/A 10/17/2017   Procedure: LOOP RECORDER REMOVAL;  Surgeon: Thompson Grayer, MD;  Location: [place] CV LAB;  Service: Cardiovascular;  Laterality: N/A;  . TEE WITHOUT CARDIOVERSION N/A 07/15/2013   Procedure: TRANSESOPHAGEAL ECHOCARDIOGRAM (TEE);  Surgeon: Lelon Perla, MD;  Location: [facility] ENDOSCOPY;  Service: Cardiovascular;  Laterality: N/A;  .  TONSILLECTOMY    . TRANSANAL HEMORRHOIDAL DEARTERIALIZATION N/A 09/22/2015   Procedure: TRANSANAL HEMORRHOIDAL LIGATION/PEXY EUA POSSIBLE HEMORRHOIDECTOMY ;  Surgeon: Michael Boston, MD;  Location: [facility];  Service: General;  Laterality: N/A;    ROS- all systems are reviewed and negatives except as per HPI above  Current Outpatient Medications  Medication Sig Dispense Refill  . apixaban (ELIQUIS) 5 MG TABS tablet Take 1 tablet (5 mg total) by mouth 2 (two) times daily. 180 tablet 3  . esomeprazole (NEXIUM) 20 MG capsule Take 20 mg by mouth daily.     . hydrochlorothiazide 25 MG tablet Take 25 mg by mouth daily.      Marland Kitchen HYDROcodone-acetaminophen (NORCO) 7.5-325 MG tablet Take 1-2 tablets by mouth every 4 (four) hours as needed for moderate pain or severe pain. (Patient taking differently: Take 1 tablet by mouth 2 (two) times daily as needed for moderate pain or severe pain. ) 50 tablet 0  . lisinopril (PRINIVIL,ZESTRIL) 20 MG tablet TAKE 1 TABLET BY MOUTH DAILY (Patient taking differently: TAKE 20 MG BY MOUTH DAILY) 30 tablet 11  . metFORMIN (GLUCOPHAGE) 500 MG tablet Take 1 tablet (500 mg total) by mouth daily.    . metoprolol succinate (TOPROL-XL) 25 MG 24 hr tablet TAKE ONE-HALF TABLET BY MOUTH DAILY (Patient taking differently: TAKE 12.5 MG BY MOUTH DAILY) 45 tablet 2  . Potassium Gluconate 550 MG TABS Take 550 mg by mouth daily.     . simvastatin (ZOCOR) 40  MG tablet Take 40 mg by mouth at bedtime.       No current facility-administered medications for this visit.     Physical Exam: Vitals:   01/05/18 1342  BP: 124/62  Pulse: (!) 46  Weight: 159 lb (72.1 kg)  Height: 5\' 5"  (1.651 m)    GEN- The patient is well appearing, alert and oriented x 3 today.   Head- normocephalic, atraumatic Eyes-  Sclera clear, conjunctiva pink Ears- hearing intact Oropharynx- clear Lungs- Clear to ausculation bilaterally, normal work of breathing Heart- Regular rate and rhythm, no murmurs, rubs or  gallops, PMI not laterally displaced GI- soft, NT, ND, + BS Extremities- no clubbing, cyanosis, or edema  Wt Readings from Last 3 Encounters:  01/05/18 159 lb (72.1 kg)  10/17/17 159 lb (72.1 kg)  10/13/17 162 lb (73.5 kg)    EKG tracing ordered today is personally reviewed and shows sinus rhythm 46 bpm, otherwise normal ekg  Assessment and Plan:  1. afib Well controlled, without recurrence post ablation off AAD therapy ILR reveals 0% afib burden On eliquis for chads2vasc score of 4  2. HTN Stable No change required today  [facility] Return to see EP PA in 6 months  Thompson Grayer MD, [facility] 01/05/2018 2:25 PM

## 2018-01-05 NOTE — Patient Instructions (Addendum)
Medication Instructions:  Your physician recommends that you continue on your current medications as directed. Please refer to the Current Medication list given to you today.  Labwork: None ordered.  Testing/Procedures: None ordered.  Follow-Up: Your physician wants you to follow-up in: 6 months with Tommye Standard PA.   You will receive a reminder letter in the mail two months in advance. If you don't receive a letter, please call our office to schedule the follow-up appointment.   Any Other Special Instructions Will Be Listed Below (If Applicable).  If you need a refill on your cardiac medications before your next appointment, please call your pharmacy.

## 2018-01-12 DIAGNOSIS — M545 Low back pain: Secondary | ICD-10-CM | POA: Diagnosis not present

## 2018-01-12 DIAGNOSIS — K580 Irritable bowel syndrome with diarrhea: Secondary | ICD-10-CM | POA: Diagnosis not present

## 2018-01-12 DIAGNOSIS — E119 Type 2 diabetes mellitus without complications: Secondary | ICD-10-CM | POA: Diagnosis not present

## 2018-01-12 DIAGNOSIS — Z79891 Long term (current) use of opiate analgesic: Secondary | ICD-10-CM | POA: Diagnosis not present

## 2018-01-12 DIAGNOSIS — I10 Essential (primary) hypertension: Secondary | ICD-10-CM | POA: Diagnosis not present

## 2018-01-17 LAB — CUP PACEART INCLINIC DEVICE CHECK
Date Time Interrogation Session: 20190708180546
Implantable Pulse Generator Implant Date: 20190419

## 2018-01-19 DIAGNOSIS — M545 Low back pain: Secondary | ICD-10-CM | POA: Diagnosis not present

## 2018-01-19 DIAGNOSIS — I10 Essential (primary) hypertension: Secondary | ICD-10-CM | POA: Diagnosis not present

## 2018-01-19 DIAGNOSIS — K580 Irritable bowel syndrome with diarrhea: Secondary | ICD-10-CM | POA: Diagnosis not present

## 2018-01-19 DIAGNOSIS — E119 Type 2 diabetes mellitus without complications: Secondary | ICD-10-CM | POA: Diagnosis not present

## 2018-01-21 ENCOUNTER — Other Ambulatory Visit: Payer: Self-pay | Admitting: Internal Medicine

## 2018-01-26 ENCOUNTER — Ambulatory Visit (INDEPENDENT_AMBULATORY_CARE_PROVIDER_SITE_OTHER): Payer: PPO | Admitting: *Deleted

## 2018-01-26 DIAGNOSIS — I480 Paroxysmal atrial fibrillation: Secondary | ICD-10-CM

## 2018-01-26 NOTE — Progress Notes (Signed)
Carelink Summary Report / Loop Recorder 

## 2018-02-05 LAB — CUP PACEART REMOTE DEVICE CHECK
Implantable Pulse Generator Implant Date: 20190419
MDC IDC SESS DTM: 20190624153708

## 2018-02-26 ENCOUNTER — Ambulatory Visit (INDEPENDENT_AMBULATORY_CARE_PROVIDER_SITE_OTHER): Payer: PPO | Admitting: *Deleted

## 2018-02-26 DIAGNOSIS — I480 Paroxysmal atrial fibrillation: Secondary | ICD-10-CM

## 2018-02-26 DIAGNOSIS — I495 Sick sinus syndrome: Secondary | ICD-10-CM

## 2018-02-27 DIAGNOSIS — I480 Paroxysmal atrial fibrillation: Secondary | ICD-10-CM | POA: Diagnosis not present

## 2018-02-27 NOTE — Progress Notes (Signed)
Carelink Summary Report / Loop Recorder 

## 2018-03-10 LAB — CUP PACEART REMOTE DEVICE CHECK
Date Time Interrogation Session: 20190727160907
MDC IDC PG IMPLANT DT: 20190419

## 2018-03-25 LAB — CUP PACEART REMOTE DEVICE CHECK
Implantable Pulse Generator Implant Date: 20190419
MDC IDC SESS DTM: 20190829174037

## 2018-03-31 ENCOUNTER — Ambulatory Visit (INDEPENDENT_AMBULATORY_CARE_PROVIDER_SITE_OTHER): Payer: PPO | Admitting: *Deleted

## 2018-03-31 DIAGNOSIS — I495 Sick sinus syndrome: Secondary | ICD-10-CM | POA: Diagnosis not present

## 2018-04-01 LAB — CUP PACEART REMOTE DEVICE CHECK
Date Time Interrogation Session: 20191001174028
Implantable Pulse Generator Implant Date: 20190419

## 2018-04-01 NOTE — Progress Notes (Signed)
Carelink Summary Report / Loop Recorder 

## 2018-05-04 ENCOUNTER — Ambulatory Visit (INDEPENDENT_AMBULATORY_CARE_PROVIDER_SITE_OTHER): Payer: PPO | Admitting: *Deleted

## 2018-05-04 DIAGNOSIS — I480 Paroxysmal atrial fibrillation: Secondary | ICD-10-CM

## 2018-05-04 NOTE — Progress Notes (Signed)
Carelink Summary Report / Loop Recorder 

## 2018-06-01 ENCOUNTER — Other Ambulatory Visit: Payer: Self-pay | Admitting: Internal Medicine

## 2018-06-02 DIAGNOSIS — Z23 Encounter for immunization: Secondary | ICD-10-CM | POA: Diagnosis not present

## 2018-06-02 DIAGNOSIS — Z0000 Encounter for general adult medical examination without abnormal findings: Secondary | ICD-10-CM | POA: Diagnosis not present

## 2018-06-04 DIAGNOSIS — E119 Type 2 diabetes mellitus without complications: Secondary | ICD-10-CM | POA: Diagnosis not present

## 2018-06-04 DIAGNOSIS — E785 Hyperlipidemia, unspecified: Secondary | ICD-10-CM | POA: Diagnosis not present

## 2018-06-04 DIAGNOSIS — I10 Essential (primary) hypertension: Secondary | ICD-10-CM | POA: Diagnosis not present

## 2018-06-04 DIAGNOSIS — K582 Mixed irritable bowel syndrome: Secondary | ICD-10-CM | POA: Diagnosis not present

## 2018-06-05 ENCOUNTER — Ambulatory Visit (INDEPENDENT_AMBULATORY_CARE_PROVIDER_SITE_OTHER): Payer: PPO

## 2018-06-05 DIAGNOSIS — I480 Paroxysmal atrial fibrillation: Secondary | ICD-10-CM

## 2018-06-08 NOTE — Progress Notes (Signed)
Carelink Summary Report / Loop Recorder 

## 2018-06-14 NOTE — Progress Notes (Signed)
Cardiology Office Note Date:  06/16/2018  Patient ID:  Erika Lee, Erika Lee Nov 06, 1945, MRN [id number] PCP:  Sinda Du, MD  Electrophysiologist:  Dr. Rayann Heman   Chief Complaint:  Routine 6 mo EP visit  History of Present Illness: Erika Lee is a 72 y.o. female with history of DM, GERD, HTN, AFlutter/AFib (s/p CTI and PVI ablation 2015).  She comes in today to be seen for dr. Rayann Heman, last seen by him in July 2019, at that visit doing well, ILR noted AF burden of 0%, no changes were made, planned for 6 mo follow up.  She has trouble with her back and knees, outside of this doing well.  And despite them she remains very active, always on the going and doing something.  Remarks she rarely sits and that the more she sits/is still the worse her back/knees ache.  She denies any CP, palpitations or SOB, no dizziness, near syncope or syncope, no SOB.  She denies any bleeding or signs of bleeding.   Device information: MDT ILR implanted 10/17/17, AFib   Past Medical History:  Diagnosis Date  . Arthritis   . Atrial flutter ([place])    s/p CTI by [name] 07/16/2013  . Diabetes mellitus    Borderline  . Essential hypertension, benign   . GERD (gastroesophageal reflux disease)   . Hemorrhoids   . Hyperlipidemia   . Multinodular goiter   . Osteoarthritis   . Palpitations   . Paroxysmal atrial fibrillation ([facility])    s/p PVI by [name] 07/16/2013    Past Surgical History:  Procedure Laterality Date  . ATRIAL FIBRILLATION ABLATION N/A 07/16/2013   PVI and CTI ablation by [name]  . BIOPSY THYROID    . COLONOSCOPY  09/10/2011   Dr. Arnoldo Morale: normal, 10 year follow up.  . LESION REMOVAL N/A 05/03/2013   Procedure: EXCISION CYST, BACK;  Surgeon: Jamesetta So, MD;  Location: [facility];  Service: General;  Laterality: N/A;  . LOOP RECORDER IMPLANT N/A 07/26/2014   Procedure: LOOP RECORDER IMPLANT;  Surgeon: Thompson Grayer, MD;  Location: [facility] CATH LAB;  Service: Cardiovascular;  Laterality:  N/A;  . LOOP RECORDER INSERTION N/A 10/17/2017   MDT previously implanted ILR for afib management at [facility].  old device removed and new device placed by [name]  . LOOP RECORDER REMOVAL N/A 10/17/2017   MDT ILR removed with new device subsequently replaced  . TEE WITHOUT CARDIOVERSION N/A 07/15/2013   Procedure: TRANSESOPHAGEAL ECHOCARDIOGRAM (TEE);  Surgeon: Lelon Perla, MD;  Location: [facility] ENDOSCOPY;  Service: Cardiovascular;  Laterality: N/A;  . TONSILLECTOMY    . TRANSANAL HEMORRHOIDAL DEARTERIALIZATION N/A 09/22/2015   Procedure: TRANSANAL HEMORRHOIDAL LIGATION/PEXY EUA POSSIBLE HEMORRHOIDECTOMY ;  Surgeon: Michael Boston, MD;  Location: [facility];  Service: General;  Laterality: N/A;    Current Outpatient Medications  Medication Sig Dispense Refill  . apixaban (ELIQUIS) 5 MG TABS tablet Take 1 tablet (5 mg total) by mouth 2 (two) times daily. 180 tablet 3  . esomeprazole (NEXIUM) 20 MG capsule Take 20 mg by mouth daily.     . hydrochlorothiazide 25 MG tablet Take 25 mg by mouth daily.      Marland Kitchen HYDROcodone-acetaminophen (NORCO) 7.5-325 MG tablet Take 1-2 tablets by mouth every 4 (four) hours as needed for moderate pain or severe pain. (Patient taking differently: Take 1 tablet by mouth 2 (two) times daily as needed for moderate pain or severe pain. ) 50 tablet 0  . lisinopril (PRINIVIL,ZESTRIL)  20 MG tablet TAKE 1 TABLET BY MOUTH DAILY 30 tablet 11  . metFORMIN (GLUCOPHAGE) 500 MG tablet Take 1 tablet (500 mg total) by mouth daily.    . metoprolol succinate (TOPROL-XL) 25 MG 24 hr tablet Take 0.5 tablets (12.5 mg total) by mouth daily. 45 tablet 3  . Potassium Gluconate 550 MG TABS Take 550 mg by mouth daily.     . simvastatin (ZOCOR) 40 MG tablet Take 40 mg by mouth at bedtime.       No current facility-administered medications for this visit.     Allergies:   Clindamycin/lincomycin; Doxycycline; Keflex [cephalexin]; Adhesive [tape]; Latex; and Sulfonamide derivatives   Social History:   The patient  reports that she quit smoking about 24 years ago. Her smoking use included cigarettes. She has never used smokeless tobacco. She reports that she does not drink alcohol or use drugs.   Family History:  The patient's family history includes Kidney disease in her maternal grandfather; Other in her maternal grandfather; Stroke in her maternal grandmother; Stroke (age of onset: 84) in her mother.  ROS:  Please see the history of present illness.    All other systems are reviewed and otherwise negative.   PHYSICAL EXAM:  VS:  BP 124/72   Pulse (!) 57   Ht 5\' 5"  (1.651 m)   Wt 165 lb (74.8 kg)   LMP  (LMP Unknown)   BMI 27.46 kg/m  BMI: Body mass index is 27.46 kg/m. Well nourished, well developed, in no acute distress  HEENT: normocephalic, atraumatic  Neck: no JVD, carotid bruits or masses Cardiac: RRR; no significant murmurs, no rubs, or gallops Lungs:  CTA b/l, no wheezing, rhonchi or rales  Abd: soft, nontender MS: no deformity or atrophy Ext:  no edema  Skin: warm and dry, no rash Neuro:  No gross deficits appreciated Psych: euthymic mood, full affect  ILR site is stable, no tethering or discomfort   EKG:  Not done today ILR interrogation done today and reviewed by myself; batter and R waves are good, no AF  07/15/17: TTE Study Conclusions - Left ventricle: Systolic function was normal. The estimated ejection fraction was in the range of 55% to 60%. Wall motion was normal; there were no regional wall motion abnormalities. - Aortic valve: No evidence of vegetation. - Mitral valve: No evidence of vegetation. Mild regurgitation. - Left atrium: The atrium was mildly dilated. No evidence of thrombus in the atrial cavity or appendage. - Atrial septum: No defect or patent foramen ovale was identified. There was redundancy of the septum, with borderline criteria for aneurysm. - Tricuspid valve: No evidence of vegetation.   Recent Labs: 09/02/2017:  Creatinine, Ser 0.80  No results found for requested labs within last 8760 hours.   CrCl cannot be calculated (Patient's most recent lab result is older than the maximum 21 days allowed.).   Wt Readings from Last 3 Encounters:  06/16/18 165 lb (74.8 kg)  01/05/18 159 lb (72.1 kg)  10/17/17 159 lb (72.1 kg)     Other studies reviewed: Additional studies/records reviewed today include: summarized above  ASSESSMENT AND PLAN:  1. Persistent AFib, Aflutter     S/p CTI and PVI ablation 2015     CHA2DS2Vasc is 4, on Eliquis, appropriately dosed  KPN 06/04/18 Hgb 14.3 Creat 0.80   2. ILR     0% AF burden  3. HTN     Looks OK  4. HLD    Monitored and managed with her  PMD     Disposition: F/u with monthly loop transmission, see her back in 6 months, sooner if needed.  Current medicines are reviewed at length with the patient today.  The patient did not have any concerns regarding medicines.  Venetia Night, PA-C 06/16/2018 1:09 PM     [address] [place] [address] [phone] (office)  [phone] (fax)

## 2018-06-15 DIAGNOSIS — M9903 Segmental and somatic dysfunction of lumbar region: Secondary | ICD-10-CM | POA: Diagnosis not present

## 2018-06-15 DIAGNOSIS — M546 Pain in thoracic spine: Secondary | ICD-10-CM | POA: Diagnosis not present

## 2018-06-16 ENCOUNTER — Ambulatory Visit: Payer: PPO | Admitting: Physician Assistant

## 2018-06-16 VITALS — BP 124/72 | HR 57 | Ht 65.0 in | Wt 165.0 lb

## 2018-06-16 DIAGNOSIS — I10 Essential (primary) hypertension: Secondary | ICD-10-CM | POA: Diagnosis not present

## 2018-06-16 DIAGNOSIS — I4819 Other persistent atrial fibrillation: Secondary | ICD-10-CM | POA: Diagnosis not present

## 2018-06-16 NOTE — Patient Instructions (Addendum)
Medication Instructions:   Your physician recommends that you continue on your current medications as directed. Please refer to the Current Medication list given to you today.   If you need a refill on your cardiac medications before your next appointment, please call your pharmacy.    Lab work: NONE ORDERED  TODAY    If you have labs (blood work) drawn today and your tests are completely normal, you will receive your results only by: Marland Kitchen MyChart Message (if you have MyChart) OR . A paper copy in the mail If you have any lab test that is abnormal or we need to change your treatment, we will call you to review the results.  Testing/Procedures:  NONE ORDERED  TODAY    Follow-Up: At [facility], you and your health needs are our priority.  As part of our continuing mission to provide you with exceptional heart care, we have created designated Provider Care Teams.  These Care Teams include your primary Cardiologist (physician) and Advanced Practice Providers (APPs -  Physician Assistants and Nurse Practitioners) who all work together to provide you with the care you need, when you need it. You will need a follow up appointment in 6 months.  Please call our office 2 months in advance to schedule this appointment.  You may see [place] or one of the following Advanced Practice Providers on your designated Care Team:   Chanetta Marshall, NP . Tommye Standard, PA-C  Remote monitoring is used to monitor your Pacemaker of ICD from home. This monitoring reduces the number of office visits required to check your device to one time per year. It allows [place] to keep an eye on the functioning of your device to ensure it is working properly. You are scheduled for a device check from home on .  07-08-2018  You may send your transmission at any time that day. If you have a wireless device, the transmission will be sent automatically. After your physician reviews your transmission, you will receive a postcard with your  next transmission date.   Any Other Special Instructions Will Be Listed Below (If Applicable).

## 2018-06-17 DIAGNOSIS — M9903 Segmental and somatic dysfunction of lumbar region: Secondary | ICD-10-CM | POA: Diagnosis not present

## 2018-06-17 DIAGNOSIS — M546 Pain in thoracic spine: Secondary | ICD-10-CM | POA: Diagnosis not present

## 2018-06-27 LAB — CUP PACEART REMOTE DEVICE CHECK
Implantable Pulse Generator Implant Date: 20190419
MDC IDC SESS DTM: 20191103173635

## 2018-07-08 ENCOUNTER — Ambulatory Visit (INDEPENDENT_AMBULATORY_CARE_PROVIDER_SITE_OTHER): Payer: PPO

## 2018-07-08 DIAGNOSIS — I4891 Unspecified atrial fibrillation: Secondary | ICD-10-CM | POA: Diagnosis not present

## 2018-07-09 LAB — CUP PACEART REMOTE DEVICE CHECK
Date Time Interrogation Session: 20200108173631
MDC IDC PG IMPLANT DT: 20190419

## 2018-07-10 NOTE — Progress Notes (Signed)
Carelink Summary Report / Loop Recorder 

## 2018-07-19 LAB — CUP PACEART REMOTE DEVICE CHECK
Implantable Pulse Generator Implant Date: 20190419
MDC IDC SESS DTM: 20191206173543

## 2018-07-22 ENCOUNTER — Ambulatory Visit (HOSPITAL_COMMUNITY)
Admission: RE | Admit: 2018-07-22 | Discharge: 2018-07-22 | Disposition: A | Discharge status: Home / Self Care | Payer: PPO | Source: Ambulatory Visit | Attending: Pulmonary Disease | Admitting: Pulmonary Disease

## 2018-07-22 ENCOUNTER — Other Ambulatory Visit (HOSPITAL_COMMUNITY): Payer: Self-pay | Admitting: Pulmonary Disease

## 2018-07-22 DIAGNOSIS — M25512 Pain in left shoulder: Secondary | ICD-10-CM | POA: Insufficient documentation

## 2018-07-22 DIAGNOSIS — M25511 Pain in right shoulder: Secondary | ICD-10-CM

## 2018-07-22 DIAGNOSIS — I10 Essential (primary) hypertension: Secondary | ICD-10-CM | POA: Diagnosis not present

## 2018-07-22 DIAGNOSIS — M19011 Primary osteoarthritis, right shoulder: Secondary | ICD-10-CM | POA: Diagnosis not present

## 2018-07-22 DIAGNOSIS — E1169 Type 2 diabetes mellitus with other specified complication: Secondary | ICD-10-CM | POA: Diagnosis not present

## 2018-07-22 DIAGNOSIS — M19012 Primary osteoarthritis, left shoulder: Secondary | ICD-10-CM | POA: Diagnosis not present

## 2018-08-04 DIAGNOSIS — M75112 Incomplete rotator cuff tear or rupture of left shoulder, not specified as traumatic: Secondary | ICD-10-CM | POA: Diagnosis not present

## 2018-08-04 DIAGNOSIS — M7541 Impingement syndrome of right shoulder: Secondary | ICD-10-CM | POA: Diagnosis not present

## 2018-08-10 ENCOUNTER — Ambulatory Visit (INDEPENDENT_AMBULATORY_CARE_PROVIDER_SITE_OTHER): Payer: PPO

## 2018-08-10 DIAGNOSIS — I4891 Unspecified atrial fibrillation: Secondary | ICD-10-CM

## 2018-08-10 LAB — CUP PACEART REMOTE DEVICE CHECK
Implantable Pulse Generator Implant Date: 20190419
MDC IDC SESS DTM: 20200210145649

## 2018-08-18 IMAGING — DX DG LUMBAR SPINE COMPLETE 4+V
5 series · 5 of 5 positions shown · non-contrast
Comparison: 08/16/2014.

CLINICAL DATA: Knee pain.  Back pain.  No reported injury.

EXAM:
LUMBAR SPINE - COMPLETE 4+ VIEW

[l-spine ap]
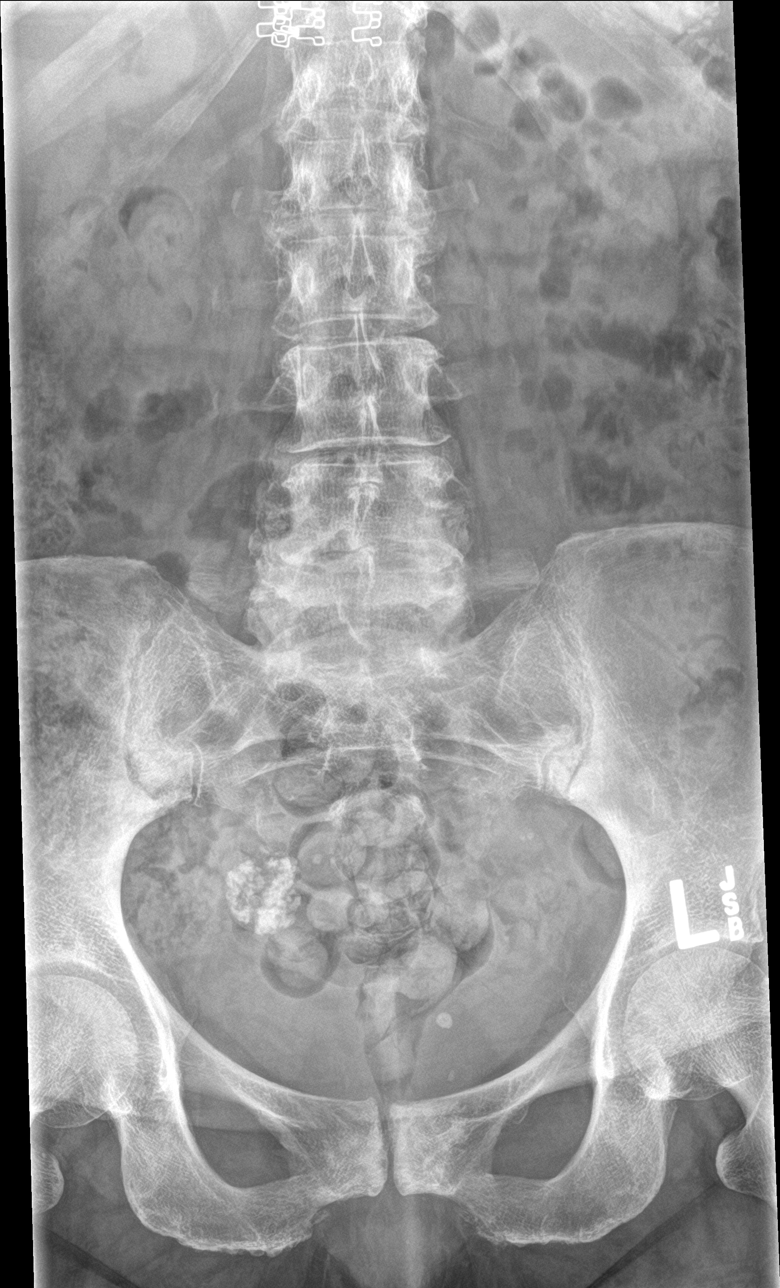

[l-spine obl (1 of 2)]
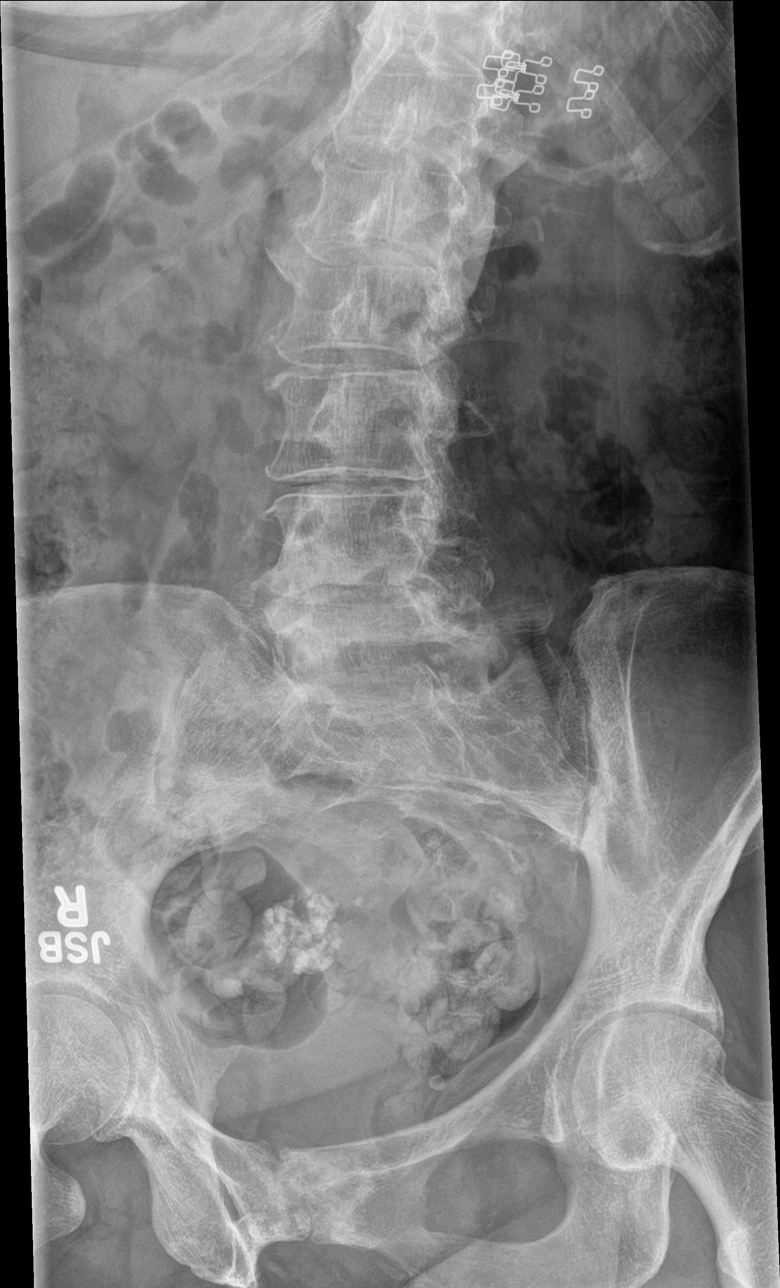

[l-spine obl (2 of 2)]
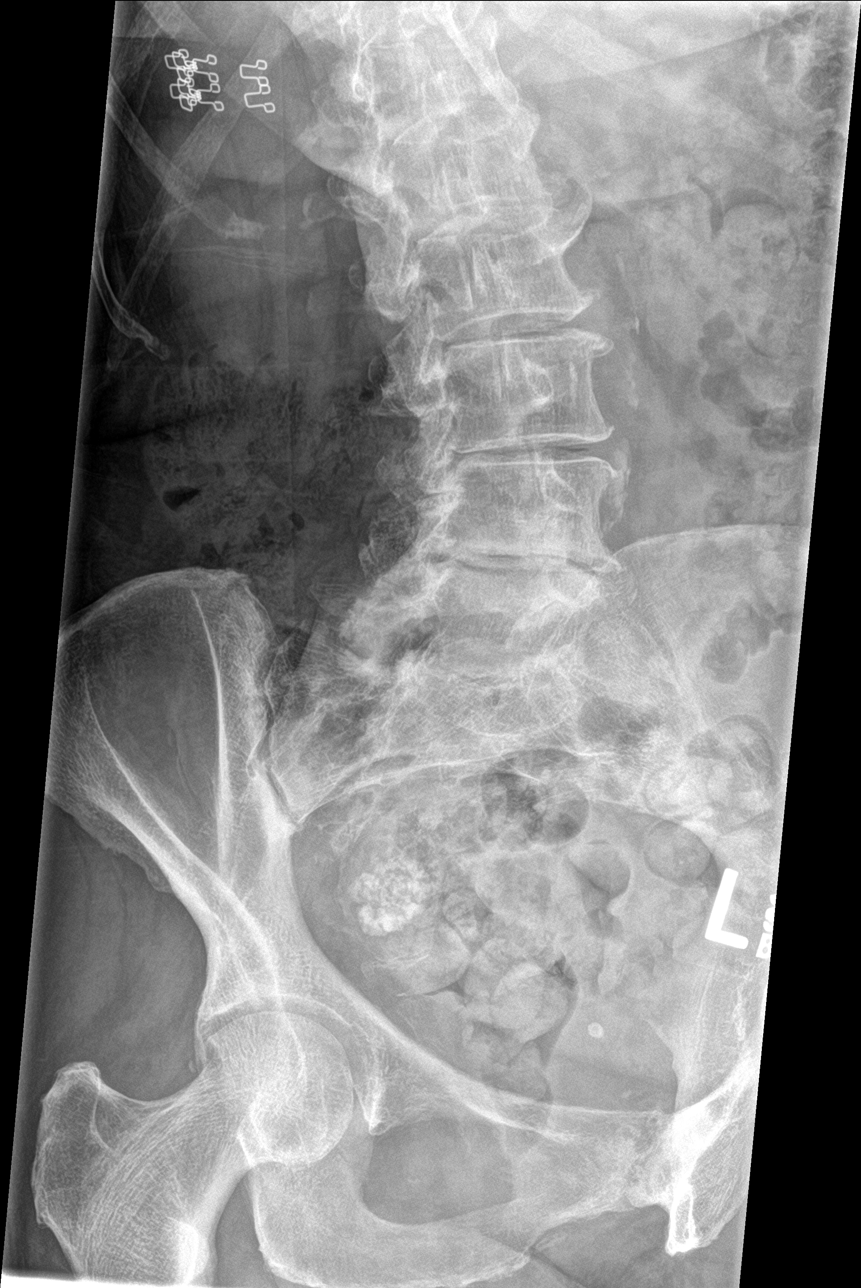

[l-spine lat]
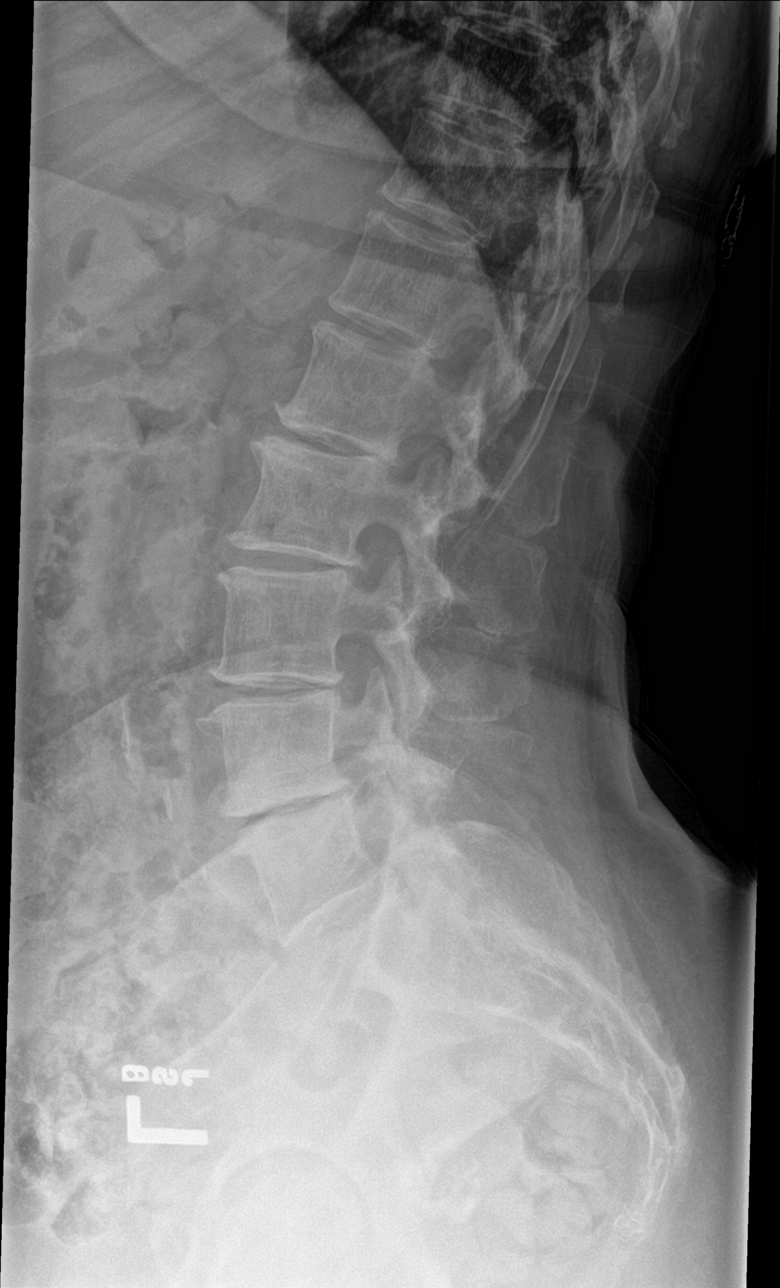

[l-spine spot]
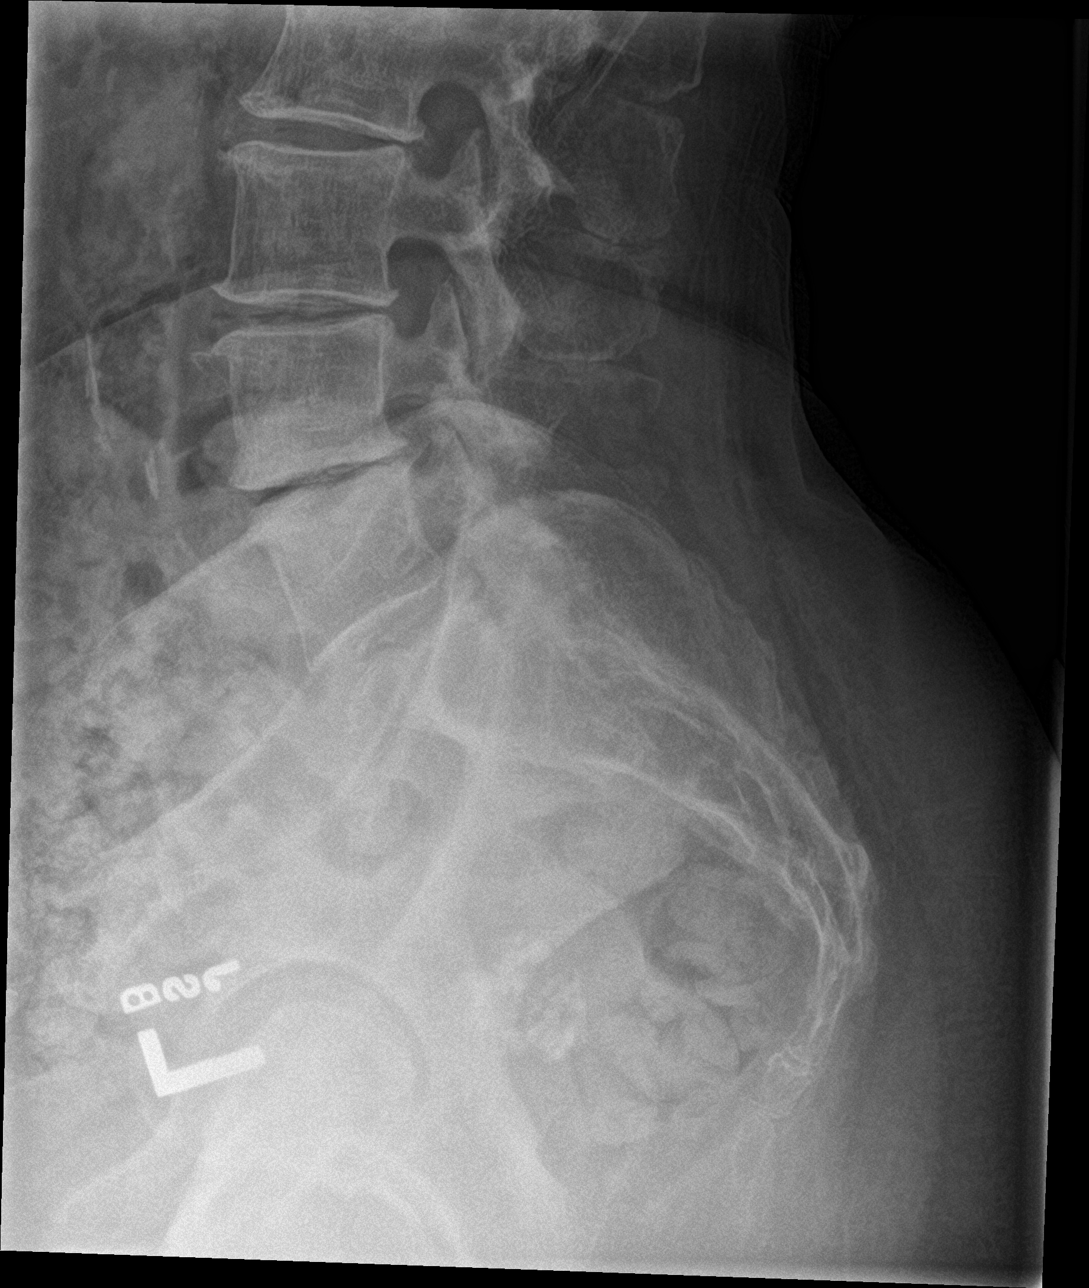

[5 of 5 positions shown; findings below may reference images not displayed]

FINDINGS: Diffuse multilevel degenerative change. Degenerative changes most
prominent L4-L5 and L5-S1. Diffuse osteopenia. No acute bony
abnormality identified. Calcified pelvic density noted most likely
fibroid. Small pelvic pelvic calcifications most likely phleboliths.
Aortoiliac atherosclerotic vascular disease.
IMPRESSION: 1. Diffuse multilevel degenerative change. Degenerative changes most
prominent at L4-L5 and L5-S1. Diffuse osteopenia. No acute bony
abnormality.

2. Calcified uterine fibroid.

3. Aortoiliac atherosclerotic vascular disease.

## 2018-08-20 DIAGNOSIS — E119 Type 2 diabetes mellitus without complications: Secondary | ICD-10-CM | POA: Diagnosis not present

## 2018-08-20 DIAGNOSIS — I10 Essential (primary) hypertension: Secondary | ICD-10-CM | POA: Diagnosis not present

## 2018-08-20 DIAGNOSIS — J329 Chronic sinusitis, unspecified: Secondary | ICD-10-CM | POA: Diagnosis not present

## 2018-08-20 DIAGNOSIS — J209 Acute bronchitis, unspecified: Secondary | ICD-10-CM | POA: Diagnosis not present

## 2018-08-20 NOTE — Progress Notes (Signed)
Carelink Summary Report / Loop Recorder 

## 2018-08-25 ENCOUNTER — Other Ambulatory Visit: Payer: Self-pay

## 2018-08-25 ENCOUNTER — Ambulatory Visit (HOSPITAL_COMMUNITY): Payer: PPO | Attending: Orthopedic Surgery | Admitting: Occupational Therapy

## 2018-08-25 ENCOUNTER — Encounter (HOSPITAL_COMMUNITY): Payer: Self-pay | Admitting: Occupational Therapy

## 2018-08-25 DIAGNOSIS — R29898 Other symptoms and signs involving the musculoskeletal system: Secondary | ICD-10-CM | POA: Insufficient documentation

## 2018-08-25 DIAGNOSIS — M25511 Pain in right shoulder: Secondary | ICD-10-CM | POA: Diagnosis not present

## 2018-08-25 NOTE — Patient Instructions (Signed)
1) Flexion Wall Stretch    Face wall, place affected handon wall in front of you. Slide hand up the wall  and lean body in towards the wall. Hold for 10 seconds. Repeat 3-5 times. 1-2 times/day.     2) Towel Stretch with Internal Rotation   Or     Gently pull up (or to the side) your affected arm  behind your back with the assist of a towel. Hold 10 seconds, repeat 3-5 times. 1-2 times/day.             3) Corner Stretch    Stand at a corner of a wall, place your arms on the walls with elbows bent. Lean into the corner until a stretch is felt along the front of your chest and/or shoulders. Hold for 10 seconds. Repeat 3-5X, 1-2 times/day.    4) Posterior Capsule Stretch    Bring the involved arm across chest. Grasp elbow and pull toward chest until you feel a stretch in the back of the upper arm and shoulder. Hold 10 seconds. Repeat 3-5X. Complete 1-2 times/day.    5) Scapular Retraction    Tuck chin back as you pinch shoulder blades together.  Hold 5 seconds. Repeat 3-5X. Complete 1-2 times/day.    6) External Rotation Stretch:     Place your affected hand on the wall with the elbow bent and gently turn your body the opposite direction until a stretch is felt. Hold 10 seconds, repeat 3-5X. Complete 1-2 times/day.   OR    Standing in an open doorway, place your arm on the edge of the doorway with the shoulder at 90 degrees from the side and elbow bent to 90 degrees. Lean forward until you feel a stretch on the front of your shoulder. Keep neck relaxed. Hold 10 seconds, repeat 3-5X. Complete 1-2 times/day    Repeat all exercises 10-15 times, 1-2 times per day.  1) Shoulder Protraction    Begin with elbows by your side, slowly "punch" straight out in front of you.      2) Shoulder Flexion  Supine:     Standing:         Begin with arms at your side with thumbs pointed up, slowly raise both arms up and forward towards overhead.                3) Horizontal abduction/adduction  Supine:   Standing:           Begin with arms straight out in front of you, bring out to the side in at "T" shape. Keep arms straight entire time.                 4) Internal & External Rotation    *No band* -Stand with elbows at the side and elbows bent 90 degrees. Move your forearms away from your body, then bring back inward toward the body.     5) Shoulder Abduction  Supine:     Standing:       Lying on your back begin with your arms flat on the table next to your side. Slowly move your arms out to the side so that they go overhead, in a jumping jack or snow angel movement.        (Home) Extension: Isometric / Bilateral Arm Retraction - Sitting   Facing anchor, hold hands and elbow at shoulder height, with elbow bent.  Pull arms back to squeeze shoulder blades together. Repeat 10-15 times. 1-3 times/day.   (  Clinic) Extension / Flexion (Assist)   Face anchor, pull arms back, keeping elbow straight, and squeze shoulder blades together. Repeat 10-15 times. 1-3 times/day.   Copyright  VHI. All rights reserved.   (Home) Retraction: Row - Bilateral (Anchor)   Facing anchor, arms reaching forward, pull hands toward stomach, keeping elbows bent and at your sides and pinching shoulder blades together. Repeat 10-15 times. 1-3 times/day.   Copyright  VHI. All rights reserved.

## 2018-08-25 NOTE — Therapy (Signed)
[facility] [address] [place], [place], [address] Phone: [phone]   Fax:  [phone]  Occupational Therapy Evaluation  Patient Details  Name: Erika Lee MRN: [id number] Date of Birth: March 26, 1946 Referring Provider (OT): Dr. Victorino December   Encounter Date: 08/25/2018  OT End of Session - 08/25/18 1534    Visit Number  1    Number of Visits  1    Date for OT Re-Evaluation  08/26/18    Authorization Type  [employer]    Authorization Time Period  no visit limit; $10 copay    OT Start Time  1435    OT Stop Time  1516    OT Time Calculation (min)  41 min    Activity Tolerance  Patient tolerated treatment well    Behavior During Therapy  [facility] for tasks assessed/performed       Past Medical History:  Diagnosis Date  . Arthritis   . Atrial flutter ([place])    s/p CTI by [name] 07/16/2013  . Diabetes mellitus    Borderline  . Essential hypertension, benign   . GERD (gastroesophageal reflux disease)   . Hemorrhoids   . Hyperlipidemia   . Multinodular goiter   . Osteoarthritis   . Palpitations   . Paroxysmal atrial fibrillation ([facility])    s/p PVI by [name] 07/16/2013    Past Surgical History:  Procedure Laterality Date  . ATRIAL FIBRILLATION ABLATION N/A 07/16/2013   PVI and CTI ablation by [name]  . BIOPSY THYROID    . COLONOSCOPY  09/10/2011   Dr. Arnoldo Morale: normal, 10 year follow up.  . LESION REMOVAL N/A 05/03/2013   Procedure: EXCISION CYST, BACK;  Surgeon: Jamesetta So, MD;  Location: [facility];  Service: General;  Laterality: N/A;  . LOOP RECORDER IMPLANT N/A 07/26/2014   Procedure: LOOP RECORDER IMPLANT;  Surgeon: Thompson Grayer, MD;  Location: [facility] CATH LAB;  Service: Cardiovascular;  Laterality: N/A;  . LOOP RECORDER INSERTION N/A 10/17/2017   MDT previously implanted ILR for afib management at [facility].  old device removed and new device placed by [name]  . LOOP RECORDER REMOVAL N/A 10/17/2017   MDT ILR removed  with new device subsequently replaced  . TEE WITHOUT CARDIOVERSION N/A 07/15/2013   Procedure: TRANSESOPHAGEAL ECHOCARDIOGRAM (TEE);  Surgeon: Lelon Perla, MD;  Location: [facility] ENDOSCOPY;  Service: Cardiovascular;  Laterality: N/A;  . TONSILLECTOMY    . TRANSANAL HEMORRHOIDAL DEARTERIALIZATION N/A 09/22/2015   Procedure: TRANSANAL HEMORRHOIDAL LIGATION/PEXY EUA POSSIBLE HEMORRHOIDECTOMY ;  Surgeon: Michael Boston, MD;  Location: [facility]S;  Service: General;  Laterality: N/A;    There were no vitals filed for this visit.  Subjective Assessment - 08/25/18 1528    Subjective   S: My shoulders are actually feeling better now.     Pertinent History  Pt is a 73 y/o female presenting with right shoulder impingment, of note pt also has left shoulder partial RC tear. Pt reports onset approximately 6 months ago, has greatly improved since MD visit. Pt referred to occupational therapy for evaluation and treatment by Dr. Victorino December.     Patient Stated Goals  To decrease any pain and soreness I have.     Currently in Pain?  No/denies        [facility] OT Assessment - 08/25/18 1432      Assessment   Medical Diagnosis  right shoulder impingement    Referring Provider (OT)  Dr. Victorino December  Onset Date/Surgical Date  02/23/19   approximate date   Hand Dominance  Right    Next MD Visit  09/15/2018    Prior Therapy  None      Precautions   Precautions  None      Restrictions   Weight Bearing Restrictions  No      Balance Screen   Has the patient fallen in the past 6 months  No    Has the patient had a decrease in activity level because of a fear of falling?   No    Is the patient reluctant to leave their home because of a fear of falling?   No      Prior Function   Level of Independence  Independent    Vocation  Retired    Leisure  gardening, cooking      ADL   ADL comments  Pt is having difficulty with lifting pots and pans for cooking, reaching behind back, reaching overhead      Written  Expression   Dominant Hand  Right      Cognition   Overall Cognitive Status  Within Functional Limits for tasks assessed      ROM / Strength   AROM / PROM / Strength  AROM;Strength      Palpation   Palpation comment  moderate fascial restrictions in bilateral upper arms and trapezius regions      AROM   Overall AROM Comments  Assessed seated er/IR adducted    AROM Assessment Site  Shoulder    Right/Left Shoulder  Right;Left    Right Shoulder Flexion  109 Degrees    Right Shoulder ABduction  112 Degrees    Right Shoulder Internal Rotation  90 Degrees    Right Shoulder External Rotation  44 Degrees    Left Shoulder Flexion  131 Degrees    Left Shoulder ABduction  180 Degrees    Left Shoulder Internal Rotation  90 Degrees    Left Shoulder External Rotation  60 Degrees      Strength   Overall Strength Comments  Assessed seated, er/IR adducted    Strength Assessment Site  Shoulder    Right/Left Shoulder  Right;Left    Right Shoulder Flexion  4+/5    Right Shoulder ABduction  4+/5    Right Shoulder Internal Rotation  5/5    Right Shoulder External Rotation  4+/5    Left Shoulder Flexion  4+/5    Left Shoulder ABduction  4+/5    Left Shoulder Internal Rotation  5/5    Left Shoulder External Rotation  4/5                      OT Education - 08/25/18 1456    Education Details  shoulder stretches, A/ROM exercises, red theraband; educated on self-massage with a tennis ball    Person(s) Educated  Patient    Methods  Explanation;Demonstration;Handout    Comprehension  Verbalized understanding;Returned demonstration       OT Short Term Goals - 08/25/18 1644      OT SHORT TERM GOAL #1   Title  Pt will be educated on HEP to improve RUE mobility and functional use as dominant during ADL completion.     Time  1    Period  Days    Status  Achieved    Target Date  08/25/18               Plan - 08/25/18 1535  Clinical Impression Statement  A: Pt is a 73  y/o female presenting with right shoulder impingement, has recently improved since MD visit and referral. Pt demonstrating functional ROM and good strength today.  Pt is interested in HEP to complete. Educated pt on shoulder stretches, A/ROM, and scapular theraband tasks, pt performing with good form.     Occupational Profile and client history currently impacting functional performance  Pt is independent and motivated to return to highest level of functioning.     Occupational performance deficits (Please refer to evaluation for details):  ADL's;IADL's;Rest and Sleep;Leisure    Rehab Potential  Good    OT Frequency  One time visit    OT Treatment/Interventions  Self-care/ADL training;Patient/family education    Plan  P: Pt agreeable to HEP focusing on decreasing pain, improving ROM and maintaining strength    Clinical Decision Making  Limited treatment options, no task modification necessary       Patient will benefit from skilled therapeutic intervention in order to improve the following deficits and impairments:  Decreased range of motion, Impaired flexibility, Decreased activity tolerance, Increased fascial restrictions, Pain, Impaired UE functional use, Decreased strength  Visit Diagnosis: Acute pain of right shoulder  Other symptoms and signs involving the musculoskeletal system    Problem List Patient Active Problem List   Diagnosis Date Noted  . Dilation of biliary tract 02/20/2017  . Common bile duct dilatation 02/20/2017  . Nausea with vomiting 02/20/2017  . Campylobacter enteritis 02/20/2017  . Hypotension (arterial) 11/21/2016  . Volume depletion 11/21/2016  . Diabetes mellitus 11/21/2016  . Hyperlipidemia 11/21/2016  . A-fib ([place]) 11/21/2016  . Diarrhea 11/21/2016  . Colitis 11/21/2016  . Acute kidney injury ([place]) 11/21/2016  . Hypokalemia 11/21/2016  . Shortness of breath 07/25/2014  . Influenza due to identified novel influenza A virus with other respiratory  manifestations 08/03/2013  . Generalized weakness 07/29/2013  . Cough 07/29/2013  . Hyponatremia 07/28/2013  . Atrial fibrillation ([place]) 07/16/2013  . Atrial flutter ([place]) 07/07/2013  . Sinus bradycardia 06/11/2013  . Tachycardia-bradycardia ([place]) 06/11/2013  . Type 2 diabetes mellitus ([place]) 05/13/2013  . Paroxysmal atrial fibrillation ([place]) 09/03/2010  . Essential hypertension, benign 03/10/2007  . GERD 03/10/2007   Guadelupe Sabin, OTR/L  [phone] 08/25/2018, 4:45 PM  [facility] [address] [place], [place], [address] Phone: [phone]   Fax:  [phone]  Name: HILMA STEINHILBER MRN: [id number] Date of Birth: 1946-06-03

## 2018-09-01 DIAGNOSIS — K219 Gastro-esophageal reflux disease without esophagitis: Secondary | ICD-10-CM | POA: Diagnosis not present

## 2018-09-01 DIAGNOSIS — I10 Essential (primary) hypertension: Secondary | ICD-10-CM | POA: Diagnosis not present

## 2018-09-01 DIAGNOSIS — I4891 Unspecified atrial fibrillation: Secondary | ICD-10-CM | POA: Diagnosis not present

## 2018-09-01 DIAGNOSIS — E119 Type 2 diabetes mellitus without complications: Secondary | ICD-10-CM | POA: Diagnosis not present

## 2018-09-14 ENCOUNTER — Other Ambulatory Visit: Payer: Self-pay

## 2018-09-14 ENCOUNTER — Ambulatory Visit (INDEPENDENT_AMBULATORY_CARE_PROVIDER_SITE_OTHER): Payer: PPO | Admitting: *Deleted

## 2018-09-14 DIAGNOSIS — I480 Paroxysmal atrial fibrillation: Secondary | ICD-10-CM

## 2018-09-14 DIAGNOSIS — I495 Sick sinus syndrome: Secondary | ICD-10-CM

## 2018-09-15 DIAGNOSIS — M25512 Pain in left shoulder: Secondary | ICD-10-CM | POA: Diagnosis not present

## 2018-09-15 LAB — CUP PACEART REMOTE DEVICE CHECK
Date Time Interrogation Session: 20200314180631
Implantable Pulse Generator Implant Date: 20190419

## 2018-09-21 DIAGNOSIS — M25512 Pain in left shoulder: Secondary | ICD-10-CM | POA: Diagnosis not present

## 2018-09-21 NOTE — Progress Notes (Signed)
Carelink Summary Report / Loop Recorder 

## 2018-09-25 DIAGNOSIS — M25512 Pain in left shoulder: Secondary | ICD-10-CM | POA: Diagnosis not present

## 2018-10-15 ENCOUNTER — Ambulatory Visit (INDEPENDENT_AMBULATORY_CARE_PROVIDER_SITE_OTHER): Payer: PPO | Admitting: *Deleted

## 2018-10-15 ENCOUNTER — Other Ambulatory Visit: Payer: Self-pay

## 2018-10-15 DIAGNOSIS — I495 Sick sinus syndrome: Secondary | ICD-10-CM

## 2018-10-15 LAB — CUP PACEART REMOTE DEVICE CHECK
Date Time Interrogation Session: 20200416161649
Implantable Pulse Generator Implant Date: 20190419

## 2018-10-21 NOTE — Progress Notes (Signed)
Carelink Summary Report / Loop Recorder 

## 2018-11-06 DIAGNOSIS — M25512 Pain in left shoulder: Secondary | ICD-10-CM | POA: Diagnosis not present

## 2018-11-06 DIAGNOSIS — M7542 Impingement syndrome of left shoulder: Secondary | ICD-10-CM | POA: Diagnosis not present

## 2018-11-06 DIAGNOSIS — M75112 Incomplete rotator cuff tear or rupture of left shoulder, not specified as traumatic: Secondary | ICD-10-CM | POA: Diagnosis not present

## 2018-11-06 DIAGNOSIS — M13812 Other specified arthritis, left shoulder: Secondary | ICD-10-CM | POA: Diagnosis not present

## 2018-11-17 ENCOUNTER — Ambulatory Visit (INDEPENDENT_AMBULATORY_CARE_PROVIDER_SITE_OTHER): Payer: PPO | Admitting: *Deleted

## 2018-11-17 ENCOUNTER — Other Ambulatory Visit: Payer: Self-pay

## 2018-11-17 DIAGNOSIS — I495 Sick sinus syndrome: Secondary | ICD-10-CM | POA: Diagnosis not present

## 2018-11-18 LAB — CUP PACEART REMOTE DEVICE CHECK
Date Time Interrogation Session: 20200519194007
Implantable Pulse Generator Implant Date: 20190419

## 2018-11-26 NOTE — Progress Notes (Signed)
Carelink Summary Report / Loop Recorder 

## 2018-12-02 DIAGNOSIS — I10 Essential (primary) hypertension: Secondary | ICD-10-CM | POA: Diagnosis not present

## 2018-12-02 DIAGNOSIS — E119 Type 2 diabetes mellitus without complications: Secondary | ICD-10-CM | POA: Diagnosis not present

## 2018-12-02 DIAGNOSIS — M25512 Pain in left shoulder: Secondary | ICD-10-CM | POA: Diagnosis not present

## 2018-12-02 DIAGNOSIS — M25562 Pain in left knee: Secondary | ICD-10-CM | POA: Diagnosis not present

## 2018-12-09 ENCOUNTER — Telehealth: Payer: Self-pay

## 2018-12-11 NOTE — Telephone Encounter (Signed)
Spoke with pt regarding appt on 12/14/18. Pt stated she will check vitals prior to appt and did not have any questions at this moment.  

## 2018-12-14 ENCOUNTER — Encounter: Payer: Self-pay | Admitting: Internal Medicine

## 2018-12-14 ENCOUNTER — Telehealth (INDEPENDENT_AMBULATORY_CARE_PROVIDER_SITE_OTHER): Payer: PPO | Admitting: Internal Medicine

## 2018-12-14 VITALS — Ht 65.0 in | Wt 165.0 lb

## 2018-12-14 DIAGNOSIS — I10 Essential (primary) hypertension: Secondary | ICD-10-CM | POA: Diagnosis not present

## 2018-12-14 DIAGNOSIS — I480 Paroxysmal atrial fibrillation: Secondary | ICD-10-CM | POA: Diagnosis not present

## 2018-12-14 NOTE — Progress Notes (Signed)
Electrophysiology TeleHealth Note   Due to national recommendations of social distancing due to COVID 19, an audio/video telehealth visit is felt to be most appropriate for this patient at this time.  See MyChart message from today for the patient's consent to telehealth for [facility]   Date:  12/14/2018   ID:  Erika Lee, DOB [birth year], MRN [id number]  Location: patient's home  Provider location:  [place]  Evaluation Performed: Follow-up visit  PCP:  Sinda Du, MD   Electrophysiologist:  [name]  Chief Complaint:  palpitations  History of Present Illness:    Erika Lee is a 73 y.o. female who presents via telehealth conferencing today.  Since last being seen in our clinic, the patient reports doing very well.  Today, she denies symptoms of palpitations, chest pain, shortness of breath,  lower extremity edema, dizziness, presyncope, or syncope.  The patient is otherwise without complaint today.  The patient denies symptoms of fevers, chills, cough, or new SOB worrisome for COVID 19.  Past Medical History:  Diagnosis Date  . Arthritis   . Atrial flutter ([place])    s/p CTI by [name] 07/16/2013  . Diabetes mellitus    Borderline  . Essential hypertension, benign   . GERD (gastroesophageal reflux disease)   . Hemorrhoids   . Hyperlipidemia   . Multinodular goiter   . Osteoarthritis   . Palpitations   . Paroxysmal atrial fibrillation ([facility])    s/p PVI by [name] 07/16/2013    Past Surgical History:  Procedure Laterality Date  . ATRIAL FIBRILLATION ABLATION N/A 07/16/2013   PVI and CTI ablation by [name]  . BIOPSY THYROID    . COLONOSCOPY  09/10/2011   Dr. Arnoldo Morale: normal, 10 year follow up.  . LESION REMOVAL N/A 05/03/2013   Procedure: EXCISION CYST, BACK;  Surgeon: Jamesetta So, MD;  Location: [facility]S;  Service: General;  Laterality: N/A;  . LOOP RECORDER IMPLANT N/A 07/26/2014   Procedure: LOOP RECORDER IMPLANT;  Surgeon: Thompson Grayer,  MD;  Location: [facility] CATH LAB;  Service: Cardiovascular;  Laterality: N/A;  . LOOP RECORDER INSERTION N/A 10/17/2017   MDT previously implanted ILR for afib management at [facility].  old device removed and new device placed by [name]  . LOOP RECORDER REMOVAL N/A 10/17/2017   MDT ILR removed with new device subsequently replaced  . TEE WITHOUT CARDIOVERSION N/A 07/15/2013   Procedure: TRANSESOPHAGEAL ECHOCARDIOGRAM (TEE);  Surgeon: Lelon Perla, MD;  Location: [facility] ENDOSCOPY;  Service: Cardiovascular;  Laterality: N/A;  . TONSILLECTOMY    . TRANSANAL HEMORRHOIDAL DEARTERIALIZATION N/A 09/22/2015   Procedure: TRANSANAL HEMORRHOIDAL LIGATION/PEXY EUA POSSIBLE HEMORRHOIDECTOMY ;  Surgeon: Michael Boston, MD;  Location: [facility];  Service: General;  Laterality: N/A;    Current Outpatient Medications  Medication Sig Dispense Refill  . apixaban (ELIQUIS) 5 MG TABS tablet Take 1 tablet (5 mg total) by mouth 2 (two) times daily. 180 tablet 3  . esomeprazole (NEXIUM) 20 MG capsule Take 20 mg by mouth daily.     . hydrochlorothiazide 25 MG tablet Take 25 mg by mouth daily.      Marland Kitchen HYDROcodone-acetaminophen (NORCO) 7.5-325 MG tablet Take 1-2 tablets by mouth every 4 (four) hours as needed for moderate pain or severe pain. (Patient taking differently: Take 1 tablet by mouth 2 (two) times daily as needed for moderate pain or severe pain. ) 50 tablet 0  . lisinopril (PRINIVIL,ZESTRIL) 20 MG tablet TAKE 1 TABLET  BY MOUTH DAILY 30 tablet 11  . metFORMIN (GLUCOPHAGE) 500 MG tablet Take 1 tablet (500 mg total) by mouth daily.    . metoprolol succinate (TOPROL-XL) 25 MG 24 hr tablet Take 0.5 tablets (12.5 mg total) by mouth daily. 45 tablet 3  . Potassium Gluconate 550 MG TABS Take 550 mg by mouth daily.     . simvastatin (ZOCOR) 40 MG tablet Take 40 mg by mouth at bedtime.       No current facility-administered medications for this visit.     Allergies:   Clindamycin/lincomycin, Doxycycline, Keflex [cephalexin], Adhesive  [tape], Latex, and Sulfonamide derivatives   Social History:  The patient  reports that she quit smoking about 24 years ago. Her smoking use included cigarettes. She has never used smokeless tobacco. She reports that she does not drink alcohol or use drugs.   Family History:  The patient's  family history includes Kidney disease in her maternal grandfather; Other in her maternal grandfather; Stroke in her maternal grandmother; Stroke (age of onset: 90) in her mother.   ROS:  Please see the history of present illness.   All other systems are personally reviewed and negative.    Exam:    Vital Signs:  Ht 5\' 5"  (1.651 m)   Wt 165 lb (74.8 kg)   LMP  (LMP Unknown)   BMI 27.46 kg/m   Well sounding today   Labs/Other Tests and Data Reviewed:    Recent Labs: No results found for requested labs within last 8760 hours.   Wt Readings from Last 3 Encounters:  12/14/18 165 lb (74.8 kg)  06/16/18 165 lb (74.8 kg)  01/05/18 159 lb (72.1 kg)     Last device remote is reviewed from [employer] PDF which reveals normal device function, no arrhythmias    ASSESSMENT & PLAN:    1.  afib Well controlled off AAD therapy No afib by ILR interrogation chads2vasc score is 5.  She is doing well with eliquis  2. HTN Stable No change required today  Carelink every  month Follow-up:  12 months  Patient Risk:  after full review of this patients clinical status, I feel that they are at moderate risk at this time.  Today, I have spent 15 minutes with the patient with telehealth technology discussing arrhythmia management .    Army Fossa, MD  12/14/2018 2:02 PM     [place] [address] [place] [address] [phone] (office) [phone] (fax)

## 2018-12-18 ENCOUNTER — Telehealth: Payer: Self-pay | Admitting: *Deleted

## 2018-12-18 NOTE — Telephone Encounter (Signed)
    [facility] Pre-operative Risk Assessment    Request for surgical clearance:  1. What type of surgery is being performed? Left Shoulder SAD, DCR, possible RCR  2. When is this surgery scheduled? 01/07/2019   3. What type of clearance is required (medical clearance vs. Pharmacy clearance to hold med vs. Both)? Both  4. Are there any medications that need to be held prior to surgery and how long? Eliquis Advisory  5. Practice name and name of physician performing surgery? [facility], Dr. Victorino December  6. What is your office phone number [phone]   7.   What is your office fax number [phone]  8.   Anesthesia type (None, local, MAC, general) ? Choice     12/18/2018, 4:44 PM  _________________________________________________________________   (provider comments below)

## 2018-12-21 ENCOUNTER — Ambulatory Visit (INDEPENDENT_AMBULATORY_CARE_PROVIDER_SITE_OTHER): Payer: PPO | Admitting: *Deleted

## 2018-12-21 ENCOUNTER — Telehealth: Payer: Self-pay | Admitting: Pharmacist Clinician (PhC)/ Clinical Pharmacy Specialist

## 2018-12-21 DIAGNOSIS — I4819 Other persistent atrial fibrillation: Secondary | ICD-10-CM

## 2018-12-21 LAB — CUP PACEART REMOTE DEVICE CHECK
Date Time Interrogation Session: 20200621194001
Implantable Pulse Generator Implant Date: 20190419

## 2018-12-21 NOTE — Telephone Encounter (Signed)
Patient with diagnosis of atrial fibrillation on Eliquis for anticoagulation.    Procedure: left shoulder SAD, DCR possible RCR Date of procedure: 01/07/2019  CHADS2-VASc score of  5 (CHF, HTN, AGE, DM2, stroke/tia x 2, CAD, AGE, female)  Patient has not had metabolic panel since March 2019.  Will need to get updated BMET/CMET for calculation of CrCl.  Once this is available we can determine clearance.    LMOM for patient to call back regarding need for metabolic panel

## 2018-12-21 NOTE — Telephone Encounter (Signed)
Pt needs metabolic panel for clearance to hold Eliquis.  LMOM for patient to return call

## 2018-12-22 NOTE — Telephone Encounter (Signed)
Signed        Patient with diagnosis of atrial fibrillation on Eliquis for anticoagulation.    Procedure: left shoulder SAD, DCR possible RCR Date of procedure: 01/07/2019  CHADS2-VASc score of  5 (CHF, HTN, AGE, DM2, stroke/tia x 2, CAD, AGE, female)  CrCl 63.8  Ok to hold Eliquis for 3 days prior to procedure.

## 2018-12-22 NOTE — Telephone Encounter (Signed)
See updated Cr result below, please advise on anticoag. Thanks!

## 2018-12-22 NOTE — Telephone Encounter (Signed)
   Primary Cardiologist: Thompson Grayer, MD  Chart reviewed as part of pre-operative protocol coverage. Spoke with patient who affirms she's doing great. Given past medical history and time since last visit, based on ACC/AHA guidelines, Erika Lee would be at acceptable risk for the planned procedure without further cardiovascular testing. RCRI 0.4% indicating low risk of CV complications.  Per pharmacist review, "Ok to hold Eliquis for 3 days prior to procedure." We typically advise that blood thinners be resumed when felt safe by performing physician.  I will route this recommendation to the requesting party via Epic fax function and remove from pre-op pool.  Please call with questions.  Charlie Pitter, PA-C 12/22/2018, 12:06 PM

## 2018-12-22 NOTE — Telephone Encounter (Signed)
Call placed to pt's PCP, Dr. Luan Pulling' office.  Spoke with Denman George and she reports pt's last CR / BUN from 05/2018 BUN:  9 CR: .77

## 2018-12-22 NOTE — Telephone Encounter (Signed)
   Primary Cardiologist:James Allred, MD  Chart reviewed as part of pre-operative protocol coverage. See below. Pharm awaiting call back from pt for BMET scheduling. Of note in [place] it appears she had some labs done 05/2018 via [facility] but Cr is not crossing over. Callback, in the meantime, can you call PCP's office and see if they by any chance have a BUN/Cr on file from 05/2018 and relay result here? Thanks.  Otherwise pt had OV 12/14/18 with Dr. Rayann Heman - hx of Afib/Aflutter, DM, HTN, GERD, HLD, goiter. Normal EF 2015. No hx of CAD that I can see. RCRI 0.4% indicating low risk of CV complications so anticipate clearing by phone call once we have info on Eliquis.   Charlie Pitter, PA-C 12/22/2018, 10:17 AM

## 2018-12-22 NOTE — Telephone Encounter (Signed)
Disregard. See preop clearance note.

## 2018-12-28 NOTE — Progress Notes (Signed)
Carelink Summary Report / Loop Recorder 

## 2019-01-04 ENCOUNTER — Other Ambulatory Visit (HOSPITAL_COMMUNITY)
Admission: RE | Admit: 2019-01-04 | Discharge: 2019-01-04 | Disposition: A | Discharge status: Home / Self Care | Payer: PPO | Source: Ambulatory Visit | Attending: Emergency Medicine | Admitting: Emergency Medicine

## 2019-01-04 DIAGNOSIS — Z1159 Encounter for screening for other viral diseases: Secondary | ICD-10-CM | POA: Diagnosis not present

## 2019-01-04 DIAGNOSIS — Z01812 Encounter for preprocedural laboratory examination: Secondary | ICD-10-CM | POA: Diagnosis not present

## 2019-01-05 LAB — SARS CORONAVIRUS 2 (TAT 6-24 HRS): SARS Coronavirus 2: NEGATIVE

## 2019-01-06 ENCOUNTER — Other Ambulatory Visit: Payer: Self-pay

## 2019-01-06 ENCOUNTER — Encounter (HOSPITAL_BASED_OUTPATIENT_CLINIC_OR_DEPARTMENT_OTHER): Payer: Self-pay | Admitting: *Deleted

## 2019-01-06 NOTE — Progress Notes (Signed)
SPOKE W/  _ pt via phone     SCREENING SYMPTOMS OF COVID 19:   COUGH-- no  RUNNY NOSE--- no  SORE THROAT--- no  NASAL CONGESTION---- yes, per pt has chronic sinusitis  SNEEZING---- no  SHORTNESS OF BREATH--- no  DIFFICULTY BREATHING--- no  TEMP >100.0 ----- no  UNEXPLAINED BODY ACHES------ no  CHILLS --------  no  HEADACHES --------- no  LOSS OF SMELL/ TASTE -------- no    HAVE YOU OR ANY FAMILY MEMBER TRAVELLED PAST 14 DAYS OUT OF THE   COUNTY--- no STATE---- no COUNTRY---- no  HAVE YOU OR ANY FAMILY MEMBER BEEN EXPOSED TO ANYONE WITH COVID 19?   denies

## 2019-01-06 NOTE — Anesthesia Preprocedure Evaluation (Addendum)
Anesthesia Evaluation  Patient identified by MRN, date of birth, ID band Patient awake    Reviewed: Allergy & Precautions, NPO status , Patient's Chart, lab work & pertinent test results  Airway Mallampati: I  TM Distance: >3 FB Neck ROM: Full    Dental  (+) Edentulous Upper, Edentulous Lower   Pulmonary former smoker,    breath sounds clear to auscultation       Cardiovascular hypertension, Pt. on medications + dysrhythmias Atrial Fibrillation  Rhythm:Regular Rate:Normal     Neuro/Psych negative neurological ROS     GI/Hepatic Neg liver ROS, GERD  Medicated,  Endo/Other  diabetes, Type 2, Oral Hypoglycemic Agents  Renal/GU      Musculoskeletal  (+) Arthritis ,   Abdominal Normal abdominal exam  (+)   Peds  Hematology negative hematology ROS (+)   Anesthesia Other Findings   Reproductive/Obstetrics                           Anesthesia Physical Anesthesia Plan  ASA: III  Anesthesia Plan: General   Post-op Pain Management: GA combined w/ Regional for post-op pain   Induction: Intravenous  PONV Risk Score and Plan: 4 or greater and Ondansetron, Dexamethasone, Scopolamine patch - Pre-op and Midazolam  Airway Management Planned: Oral ETT  Additional Equipment: None  Intra-op Plan:   Post-operative Plan: Extubation in OR  Informed Consent: I have reviewed the patients History and Physical, chart, labs and discussed the procedure including the risks, benefits and alternatives for the proposed anesthesia with the patient or authorized representative who has indicated his/her understanding and acceptance.       Plan Discussed with: CRNA  Anesthesia Plan Comments: (See PAT note 01/06/2019, Konrad Felix, PA-C)      Anesthesia Quick Evaluation

## 2019-01-06 NOTE — Progress Notes (Signed)
Anesthesia Chart Review   Case: [id number] Date/Time: 01/07/19 0715   Procedure: Left shoulder subacromial decompression, distal clavicle resection, extensive debridement, poss rotator cuff repair (Left ) - 90 mins   Anesthesia type: Choice   Pre-op diagnosis: Left shoulder impingement, acromioclavicular osteoarthritis, partial rotator cuff tear   Location: [facility] OR ROOM 4. / [place]   Surgeon: Nicholes Stairs, MD      DISCUSSION:73 y.o. former smoker (quit 01/02/94) with h/o HLD, HTN, A-fib (on Eliquis), GERD, DM II, left shoulder impingement, AC OA, rotator cuff tear scheduled for above procedure 01/07/2019 with Dr. Victorino December.   Pt cleared by cardiology 12/22/2018.  Per Melina Copa, PA-C, "Given past medical history and time since last visit, based on ACC/AHA guidelines, Erika Lee would be at acceptable risk for the planned procedure without further cardiovascular testing. RCRI 0.4% indicating low risk of CV complications.  Per pharmacist review, "Ok to hold Eliquis for 3 days prior to procedure." We typically advise that blood thinners be resumed when felt safe by performing physician."  Eliquis last dose 01/03/2019.   Anticipate pt can proceed with planned procedure barring acute status change and after evaluation DOS (SDW).  VS: Ht 5\' 5"  (1.651 m)   Wt 72.6 kg   LMP  (LMP Unknown)   BMI 26.63 kg/m   PROVIDERS: Sinda Du, MD is PCP  Thompson Grayer, MD is Cardiologist   LABS: SDW (all labs ordered are listed, but only abnormal results are displayed)  Labs Reviewed - No data to display   IMAGES:   EKG: 01/05/2018 Rate 46 bpm Sinus bradycardia  CV:  Past Medical History:  Diagnosis Date  . Anticoagulant long-term use    eliquis  . Chronic sinusitis   . Essential hypertension, benign   . GERD (gastroesophageal reflux disease)   . Hemorrhoids   . History of colitis 10/2016   campylobacter  . Hyperlipidemia   . Multinodular goiter    per pt  has had 2 biopsy's both benign  . Osteoarthritis    "all over"  and AC joint left shoulder  . Paroxysmal atrial fibrillation [facility]) EP cardiologist--  [name]   s/p PVI by [name] 07/16/2013;   pt first dx 2008,  recurrence 2014 afib/flutter and tachy-brady  . Rotator cuff tear, left   . Shoulder impingement, left   . Status post placement of implantable loop recorder    original placement 07-26-2014;  removal / replacement 10-17-2017  by dr allred  . Type 2 diabetes mellitus ([place])    followed by pcp    Past Surgical History:  Procedure Laterality Date  . ATRIAL FIBRILLATION ABLATION N/A 07/16/2013   PVI and CTI ablation by [name]  . COLONOSCOPY  09/10/2011   Dr. Arnoldo Morale: normal, 10 year follow up.  . LESION REMOVAL N/A 05/03/2013   Procedure: EXCISION CYST, BACK;  Surgeon: Jamesetta So, MD;  Location: [facility] ORS;  Service: General;  Laterality: N/A;  . LOOP RECORDER IMPLANT N/A 07/26/2014   Procedure: LOOP RECORDER IMPLANT;  Surgeon: Thompson Grayer, MD;  Location: [facility] CATH LAB;  Service: Cardiovascular;  Laterality: N/A;  . LOOP RECORDER INSERTION N/A 10/17/2017   MDT previously implanted ILR for afib management at [facility].  old device removed and new device placed by [name]  . LOOP RECORDER REMOVAL N/A 10/17/2017   MDT ILR removed with new device subsequently replaced  . TEE WITHOUT CARDIOVERSION N/A 07/15/2013   Procedure: TRANSESOPHAGEAL ECHOCARDIOGRAM (TEE);  Surgeon:  Lelon Perla, MD;  Location: [facility] ENDOSCOPY;  Service: Cardiovascular;  Laterality: N/A;  . TONSILLECTOMY  age 47  . TRANSANAL HEMORRHOIDAL DEARTERIALIZATION N/A 09/22/2015   Procedure: TRANSANAL HEMORRHOIDAL LIGATION/PEXY EUA POSSIBLE HEMORRHOIDECTOMY ;  Surgeon: Michael Boston, MD;  Location: [facility] ORS;  Service: General;  Laterality: N/A;    MEDICATIONS: No current facility-administered medications for this encounter.    . esomeprazole (NEXIUM) 20 MG capsule  . hydrochlorothiazide 25 MG tablet  .  HYDROcodone-acetaminophen (NORCO) 7.5-325 MG tablet  . lisinopril (PRINIVIL,ZESTRIL) 20 MG tablet  . metFORMIN (GLUCOPHAGE) 500 MG tablet  . metoprolol succinate (TOPROL-XL) 25 MG 24 hr tablet  . Potassium Gluconate 550 MG TABS  . simvastatin (ZOCOR) 40 MG tablet  . apixaban (ELIQUIS) 5 MG TABS tablet     Maia Plan [facility] Pre-Surgical Testing [phone] 01/06/19  1:18 PM

## 2019-01-06 NOTE — Progress Notes (Addendum)
Spoke w/ pt via phone for pre-op interview.  Npo after mn w / exception clear liquid diet until 0430 then nothing by mouth, pt verbalized understanding.  Needs istat 8 and ekg.  Pt had covid test done yesterday.  Will take toprol and nexium am dos w/ sips of water.   Pt takes eliquis, managed by [name], per pt given instructions by dr allred office to stop 3 days prior to [facility].  Per pt last dose 01-03-2019.  Telephone cardiac clearance dated 12-22-2018, dayna dunn PA, in chart and epic.  Chart to be reviewed by anesthesia, Konrad Felix PA.  Pt denies cardiac S&S or SOB.  ADDENDUM:  Per anesthesia, Janett Billow, ok to proceed.

## 2019-01-07 ENCOUNTER — Encounter (HOSPITAL_BASED_OUTPATIENT_CLINIC_OR_DEPARTMENT_OTHER): Payer: Self-pay | Admitting: *Deleted

## 2019-01-07 ENCOUNTER — Encounter (HOSPITAL_BASED_OUTPATIENT_CLINIC_OR_DEPARTMENT_OTHER)
Admission: RE | Disposition: A | Discharge status: Home / Self Care | Payer: Self-pay | Source: Home / Self Care | Attending: Orthopedic Surgery

## 2019-01-07 ENCOUNTER — Ambulatory Visit (HOSPITAL_BASED_OUTPATIENT_CLINIC_OR_DEPARTMENT_OTHER)
Admission: RE | Admit: 2019-01-07 | Discharge: 2019-01-07 | Disposition: A | Discharge status: Home / Self Care | Payer: PPO | Attending: Orthopedic Surgery | Admitting: Orthopedic Surgery

## 2019-01-07 ENCOUNTER — Ambulatory Visit (HOSPITAL_BASED_OUTPATIENT_CLINIC_OR_DEPARTMENT_OTHER): Payer: PPO | Admitting: Physician Assistant

## 2019-01-07 ENCOUNTER — Other Ambulatory Visit: Payer: Self-pay

## 2019-01-07 DIAGNOSIS — E785 Hyperlipidemia, unspecified: Secondary | ICD-10-CM | POA: Insufficient documentation

## 2019-01-07 DIAGNOSIS — Z7902 Long term (current) use of antithrombotics/antiplatelets: Secondary | ICD-10-CM | POA: Diagnosis not present

## 2019-01-07 DIAGNOSIS — Z881 Allergy status to other antibiotic agents status: Secondary | ICD-10-CM | POA: Insufficient documentation

## 2019-01-07 DIAGNOSIS — Z20828 Contact with and (suspected) exposure to other viral communicable diseases: Secondary | ICD-10-CM | POA: Diagnosis not present

## 2019-01-07 DIAGNOSIS — R0789 Other chest pain: Secondary | ICD-10-CM | POA: Diagnosis not present

## 2019-01-07 DIAGNOSIS — I10 Essential (primary) hypertension: Secondary | ICD-10-CM | POA: Diagnosis not present

## 2019-01-07 DIAGNOSIS — I480 Paroxysmal atrial fibrillation: Secondary | ICD-10-CM | POA: Diagnosis present

## 2019-01-07 DIAGNOSIS — S43432A Superior glenoid labrum lesion of left shoulder, initial encounter: Secondary | ICD-10-CM | POA: Diagnosis not present

## 2019-01-07 DIAGNOSIS — Z7901 Long term (current) use of anticoagulants: Secondary | ICD-10-CM | POA: Insufficient documentation

## 2019-01-07 DIAGNOSIS — M7520 Bicipital tendinitis, unspecified shoulder: Secondary | ICD-10-CM | POA: Diagnosis present

## 2019-01-07 DIAGNOSIS — K219 Gastro-esophageal reflux disease without esophagitis: Secondary | ICD-10-CM | POA: Insufficient documentation

## 2019-01-07 DIAGNOSIS — Z79899 Other long term (current) drug therapy: Secondary | ICD-10-CM | POA: Insufficient documentation

## 2019-01-07 DIAGNOSIS — G8918 Other acute postprocedural pain: Secondary | ICD-10-CM | POA: Diagnosis not present

## 2019-01-07 DIAGNOSIS — J329 Chronic sinusitis, unspecified: Secondary | ICD-10-CM | POA: Diagnosis present

## 2019-01-07 DIAGNOSIS — M75102 Unspecified rotator cuff tear or rupture of left shoulder, not specified as traumatic: Secondary | ICD-10-CM | POA: Diagnosis present

## 2019-01-07 DIAGNOSIS — M7542 Impingement syndrome of left shoulder: Secondary | ICD-10-CM | POA: Diagnosis not present

## 2019-01-07 DIAGNOSIS — I119 Hypertensive heart disease without heart failure: Secondary | ICD-10-CM | POA: Diagnosis present

## 2019-01-07 DIAGNOSIS — M94212 Chondromalacia, left shoulder: Secondary | ICD-10-CM | POA: Insufficient documentation

## 2019-01-07 DIAGNOSIS — M7522 Bicipital tendinitis, left shoulder: Secondary | ICD-10-CM | POA: Insufficient documentation

## 2019-01-07 DIAGNOSIS — Z882 Allergy status to sulfonamides status: Secondary | ICD-10-CM | POA: Diagnosis not present

## 2019-01-07 DIAGNOSIS — M24012 Loose body in left shoulder: Secondary | ICD-10-CM | POA: Insufficient documentation

## 2019-01-07 DIAGNOSIS — R079 Chest pain, unspecified: Secondary | ICD-10-CM | POA: Diagnosis not present

## 2019-01-07 DIAGNOSIS — Z87891 Personal history of nicotine dependence: Secondary | ICD-10-CM | POA: Insufficient documentation

## 2019-01-07 DIAGNOSIS — R9431 Abnormal electrocardiogram [ECG] [EKG]: Secondary | ICD-10-CM | POA: Diagnosis not present

## 2019-01-07 DIAGNOSIS — M25812 Other specified joint disorders, left shoulder: Secondary | ICD-10-CM | POA: Insufficient documentation

## 2019-01-07 DIAGNOSIS — R05 Cough: Secondary | ICD-10-CM | POA: Diagnosis not present

## 2019-01-07 DIAGNOSIS — Z91040 Latex allergy status: Secondary | ICD-10-CM | POA: Diagnosis not present

## 2019-01-07 DIAGNOSIS — M75112 Incomplete rotator cuff tear or rupture of left shoulder, not specified as traumatic: Secondary | ICD-10-CM | POA: Diagnosis not present

## 2019-01-07 DIAGNOSIS — Z888 Allergy status to other drugs, medicaments and biological substances status: Secondary | ICD-10-CM | POA: Diagnosis not present

## 2019-01-07 DIAGNOSIS — Z7984 Long term (current) use of oral hypoglycemic drugs: Secondary | ICD-10-CM | POA: Insufficient documentation

## 2019-01-07 DIAGNOSIS — M19012 Primary osteoarthritis, left shoulder: Secondary | ICD-10-CM | POA: Diagnosis not present

## 2019-01-07 DIAGNOSIS — E119 Type 2 diabetes mellitus without complications: Secondary | ICD-10-CM | POA: Insufficient documentation

## 2019-01-07 DIAGNOSIS — Z823 Family history of stroke: Secondary | ICD-10-CM | POA: Diagnosis not present

## 2019-01-07 DIAGNOSIS — I4891 Unspecified atrial fibrillation: Secondary | ICD-10-CM | POA: Diagnosis not present

## 2019-01-07 DIAGNOSIS — R7989 Other specified abnormal findings of blood chemistry: Secondary | ICD-10-CM | POA: Diagnosis not present

## 2019-01-07 DIAGNOSIS — Z91048 Other nonmedicinal substance allergy status: Secondary | ICD-10-CM | POA: Diagnosis not present

## 2019-01-07 DIAGNOSIS — I214 Non-ST elevation (NSTEMI) myocardial infarction: Secondary | ICD-10-CM | POA: Diagnosis not present

## 2019-01-07 HISTORY — DX: Unspecified rotator cuff tear or rupture of left shoulder, not specified as traumatic: M75.102

## 2019-01-07 HISTORY — DX: Presence of other cardiac implants and grafts: Z95.818

## 2019-01-07 HISTORY — DX: Other specified joint disorders, left shoulder: M25.812

## 2019-01-07 HISTORY — DX: Long term (current) use of anticoagulants: Z79.01

## 2019-01-07 HISTORY — DX: Type 2 diabetes mellitus without complications: E11.9

## 2019-01-07 HISTORY — DX: Chronic sinusitis, unspecified: J32.9

## 2019-01-07 HISTORY — DX: Impingement syndrome of left shoulder: M75.42

## 2019-01-07 HISTORY — PX: SHOULDER ARTHROSCOPY WITH ROTATOR CUFF REPAIR AND SUBACROMIAL DECOMPRESSION: SHX5686

## 2019-01-07 LAB — GLUCOSE, CAPILLARY: Glucose-Capillary: 176 mg/dL — ABNORMAL HIGH (ref 70–99)

## 2019-01-07 LAB — POCT I-STAT, CHEM 8
BUN: 7 mg/dL — ABNORMAL LOW (ref 8–23)
Calcium, Ion: 1.28 mmol/L (ref 1.15–1.40)
Chloride: 99 mmol/L (ref 98–111)
Creatinine, Ser: 0.6 mg/dL (ref 0.44–1.00)
Glucose, Bld: 134 mg/dL — ABNORMAL HIGH (ref 70–99)
HCT: 39 % (ref 36.0–46.0)
Hemoglobin: 13.3 g/dL (ref 12.0–15.0)
Potassium: 3.5 mmol/L (ref 3.5–5.1)
Sodium: 137 mmol/L (ref 135–145)
TCO2: 27 mmol/L (ref 22–32)

## 2019-01-07 SURGERY — SHOULDER ARTHROSCOPY WITH ROTATOR CUFF REPAIR AND SUBACROMIAL DECOMPRESSION
Anesthesia: General | Site: Shoulder | Laterality: Left

## 2019-01-07 MED ORDER — BUPIVACAINE LIPOSOME 1.3 % IJ SUSP
INTRAMUSCULAR | Status: DC | PRN
  Administered 2019-01-07: 10 mL via PERINEURAL

## 2019-01-07 MED ORDER — LIDOCAINE 2% (20 MG/ML) 5 ML SYRINGE
INTRAMUSCULAR | Status: AC
  Filled 2019-01-07: qty 5

## 2019-01-07 MED ORDER — CHLORHEXIDINE GLUCONATE 4 % EX LIQD
60.0000 mL | Freq: Once | CUTANEOUS | Status: DC
  Filled 2019-01-07: qty 118

## 2019-01-07 MED ORDER — LACTATED RINGERS IV SOLN
INTRAVENOUS | Status: DC
  Administered 2019-01-07 (×2): via INTRAVENOUS
  Filled 2019-01-07: qty 1000

## 2019-01-07 MED ORDER — FENTANYL CITRATE (PF) 100 MCG/2ML IJ SOLN
INTRAMUSCULAR | Status: DC | PRN
  Administered 2019-01-07: 25 ug via INTRAVENOUS
  Administered 2019-01-07: 50 ug via INTRAVENOUS

## 2019-01-07 MED ORDER — ROCURONIUM BROMIDE 10 MG/ML (PF) SYRINGE
PREFILLED_SYRINGE | INTRAVENOUS | Status: AC
  Filled 2019-01-07: qty 10

## 2019-01-07 MED ORDER — SUCCINYLCHOLINE CHLORIDE 20 MG/ML IJ SOLN
INTRAMUSCULAR | Status: DC | PRN
  Administered 2019-01-07: 100 mg via INTRAVENOUS

## 2019-01-07 MED ORDER — ONDANSETRON HCL 4 MG/2ML IJ SOLN
INTRAMUSCULAR | Status: AC
  Filled 2019-01-07: qty 2

## 2019-01-07 MED ORDER — MIDAZOLAM HCL 2 MG/2ML IJ SOLN
INTRAMUSCULAR | Status: AC
  Filled 2019-01-07: qty 2

## 2019-01-07 MED ORDER — OXYCODONE HCL 5 MG PO TABS: 5.0000 mg | ORAL_TABLET | Freq: Four times a day (QID) | ORAL | 0 refills | Status: DC | PRN

## 2019-01-07 MED ORDER — CEFAZOLIN SODIUM-DEXTROSE 2-4 GM/100ML-% IV SOLN
INTRAVENOUS | Status: AC
  Filled 2019-01-07: qty 100

## 2019-01-07 MED ORDER — PROPOFOL 10 MG/ML IV BOLUS
INTRAVENOUS | Status: DC | PRN
  Administered 2019-01-07: 100 mg via INTRAVENOUS

## 2019-01-07 MED ORDER — SODIUM CHLORIDE 0.9 % IR SOLN
Status: DC | PRN
  Administered 2019-01-07: 3000 mL
  Administered 2019-01-07: 500 mL

## 2019-01-07 MED ORDER — DEXAMETHASONE SODIUM PHOSPHATE 10 MG/ML IJ SOLN
INTRAMUSCULAR | Status: AC
  Filled 2019-01-07: qty 1

## 2019-01-07 MED ORDER — FENTANYL CITRATE (PF) 250 MCG/5ML IJ SOLN
INTRAMUSCULAR | Status: AC
  Filled 2019-01-07: qty 5

## 2019-01-07 MED ORDER — MIDAZOLAM HCL 5 MG/5ML IJ SOLN
INTRAMUSCULAR | Status: DC | PRN
  Administered 2019-01-07: 1 mg via INTRAVENOUS

## 2019-01-07 MED ORDER — SODIUM CHLORIDE 0.9 % IR SOLN
Status: DC | PRN
  Administered 2019-01-07: 1 mL

## 2019-01-07 MED ORDER — LIDOCAINE HCL (CARDIAC) PF 100 MG/5ML IV SOSY
PREFILLED_SYRINGE | INTRAVENOUS | Status: DC | PRN
  Administered 2019-01-07: 60 mg via INTRAVENOUS

## 2019-01-07 MED ORDER — ONDANSETRON 4 MG PO TBDP: 4.0000 mg | ORAL_TABLET | Freq: Three times a day (TID) | ORAL | 0 refills | Status: DC | PRN

## 2019-01-07 MED ORDER — ONDANSETRON HCL 4 MG/2ML IJ SOLN
INTRAMUSCULAR | Status: DC | PRN
  Administered 2019-01-07: 4 mg via INTRAVENOUS

## 2019-01-07 MED ORDER — FENTANYL CITRATE (PF) 100 MCG/2ML IJ SOLN
100.0000 ug | Freq: Once | INTRAMUSCULAR | Status: AC
  Administered 2019-01-07: 25 ug via INTRAVENOUS
  Filled 2019-01-07: qty 2

## 2019-01-07 MED ORDER — BUPIVACAINE-EPINEPHRINE (PF) 0.5% -1:200000 IJ SOLN
INTRAMUSCULAR | Status: DC | PRN
  Administered 2019-01-07: 15 mL via PERINEURAL

## 2019-01-07 MED ORDER — FENTANYL CITRATE (PF) 100 MCG/2ML IJ SOLN
INTRAMUSCULAR | Status: AC
  Filled 2019-01-07: qty 2

## 2019-01-07 MED ORDER — PROPOFOL 10 MG/ML IV BOLUS
INTRAVENOUS | Status: AC
  Filled 2019-01-07: qty 20

## 2019-01-07 MED ORDER — CLINDAMYCIN PHOSPHATE 900 MG/50ML IV SOLN
900.0000 mg | INTRAVENOUS | Status: DC
  Filled 2019-01-07: qty 50

## 2019-01-07 SURGICAL SUPPLY — 84 items
BLADE SURG 11 STRL SS (BLADE) ×3 IMPLANT
BLADE SURG 15 STRL LF DISP TIS (BLADE) IMPLANT
BLADE SURG 15 STRL SS (BLADE)
BURR OVAL 8 FLU 4.0MM X 13CM (MISCELLANEOUS)
BURR OVAL 8 FLU 4.0X13 (MISCELLANEOUS) IMPLANT
CANNULA 5.75X7 CRYSTAL CLEAR (CANNULA) ×3 IMPLANT
CANNULA 5.75X71 LONG (CANNULA) IMPLANT
CANNULA TWIST IN 8.25X7CM (CANNULA) IMPLANT
COVER MAYO STAND STRL (DRAPES) ×3 IMPLANT
COVER WAND RF STERILE (DRAPES) ×3 IMPLANT
DISSECTOR  3.8MM X 13CM (MISCELLANEOUS)
DISSECTOR 3.8MM X 13CM (MISCELLANEOUS) IMPLANT
DRAPE ORTHO SPLIT 77X108 STRL (DRAPES) ×4
DRAPE POUCH INSTRU U-SHP 10X18 (DRAPES) ×3 IMPLANT
DRAPE SHEET LG 3/4 BI-LAMINATE (DRAPES) ×3 IMPLANT
DRAPE STERI 35X30 U-POUCH (DRAPES) ×3 IMPLANT
DRAPE SURG 17X23 STRL (DRAPES) ×3 IMPLANT
DRAPE SURG ORHT 6 SPLT 77X108 (DRAPES) ×2 IMPLANT
DRAPE U-SHAPE 47X51 STRL (DRAPES) ×3 IMPLANT
DRSG PAD ABDOMINAL 8X10 ST (GAUZE/BANDAGES/DRESSINGS) ×3 IMPLANT
DURAPREP 26ML APPLICATOR (WOUND CARE) ×3 IMPLANT
ELECT MENISCUS 165MM 90D (ELECTRODE) IMPLANT
ELECT REM PT RETURN 9FT ADLT (ELECTROSURGICAL)
ELECTRODE REM PT RTRN 9FT ADLT (ELECTROSURGICAL) IMPLANT
FIBERSTICK 2 (SUTURE) IMPLANT
GAUZE SPONGE 4X4 12PLY STRL (GAUZE/BANDAGES/DRESSINGS) ×3 IMPLANT
GAUZE SPONGE 4X4 12PLY STRL LF (GAUZE/BANDAGES/DRESSINGS) ×2 IMPLANT
GAUZE XEROFORM 1X8 LF (GAUZE/BANDAGES/DRESSINGS) ×3 IMPLANT
GLOVE BIO SURGEON STRL SZ7.5 (GLOVE) ×1 IMPLANT
GLOVE BIOGEL PI IND STRL 8 (GLOVE) ×1 IMPLANT
GLOVE BIOGEL PI INDICATOR 8 (GLOVE) ×2
GOWN STRL REUS W/TWL LRG LVL3 (GOWN DISPOSABLE) ×5 IMPLANT
IV NS IRRIG 3000ML ARTHROMATIC (IV SOLUTION) ×6 IMPLANT
KIT TURNOVER CYSTO (KITS) ×3 IMPLANT
LASSO SUT 90 DEGREE (SUTURE) IMPLANT
LOOP 2 FIBERLINK CLOSED (SUTURE) IMPLANT
MANIFOLD NEPTUNE II (INSTRUMENTS) ×3 IMPLANT
NDL 1/2 CIR CATGUT .05X1.09 (NEEDLE) IMPLANT
NDL FILTER BLUNT 18X1 1/2 (NEEDLE) ×1 IMPLANT
NDL SAFETY ECLIPSE 18X1.5 (NEEDLE) IMPLANT
NDL SCORPION MULTI FIRE (NEEDLE) IMPLANT
NEEDLE 1/2 CIR CATGUT .05X1.09 (NEEDLE) IMPLANT
NEEDLE FILTER BLUNT 18X 1/2SAF (NEEDLE) ×2
NEEDLE FILTER BLUNT 18X1 1/2 (NEEDLE) ×1 IMPLANT
NEEDLE HYPO 18GX1.5 SHARP (NEEDLE)
NEEDLE SCORPION MULTI FIRE (NEEDLE) IMPLANT
NS IRRIG 500ML POUR BTL (IV SOLUTION) ×2 IMPLANT
PACK ARTHROSCOPY DSU (CUSTOM PROCEDURE TRAY) ×3 IMPLANT
PACK BASIN DAY SURGERY FS (CUSTOM PROCEDURE TRAY) ×3 IMPLANT
PAD ABD 8X10 STRL (GAUZE/BANDAGES/DRESSINGS) ×2 IMPLANT
PAD ARMBOARD 7.5X6 YLW CONV (MISCELLANEOUS) IMPLANT
PENCIL BUTTON HOLSTER BLD 10FT (ELECTRODE) IMPLANT
PROBE APOLLO 90XL (SURGICAL WAND) IMPLANT
PROBE BIPOLAR ATHRO 135MM 90D (MISCELLANEOUS) ×3 IMPLANT
SLEEVE ARM SUSPENSION SYSTEM (MISCELLANEOUS) ×3 IMPLANT
SLING S3 LATERAL DISP (MISCELLANEOUS) ×3 IMPLANT
SLING ULTRA II AB L (ORTHOPEDIC SUPPLIES) IMPLANT
SPONGE LAP 4X18 RFD (DISPOSABLE) IMPLANT
SUCTION FRAZIER HANDLE 10FR (MISCELLANEOUS)
SUCTION TUBE FRAZIER 10FR DISP (MISCELLANEOUS) IMPLANT
SUT 2 FIBERLOOP 20 STRT BLUE (SUTURE)
SUT ETHILON 3 0 PS 1 (SUTURE) IMPLANT
SUT FIBERWIRE #2 38 T-5 BLUE (SUTURE)
SUT LASSO 45 DEGREE LEFT (SUTURE) IMPLANT
SUT LASSO 45D RIGHT (SUTURE) IMPLANT
SUT MNCRL AB 3-0 PS2 27 (SUTURE) ×3 IMPLANT
SUT PDS AB 0 CT1 36 (SUTURE) IMPLANT
SUT TIGER TAPE 7 IN WHITE (SUTURE) IMPLANT
SUT VIC AB 0 CT1 36 (SUTURE) IMPLANT
SUT VIC AB 2-0 CT1 27 (SUTURE)
SUT VIC AB 2-0 CT1 TAPERPNT 27 (SUTURE) IMPLANT
SUTURE 2 FIBERLOOP 20 STRT BLU (SUTURE) IMPLANT
SUTURE FIBERWR #2 38 T-5 BLUE (SUTURE) IMPLANT
SYR CONTROL 10ML LL (SYRINGE) IMPLANT
SYR TB 1ML LL NO SAFETY (SYRINGE) IMPLANT
TAPE CLOTH SURG 6X10 WHT LF (GAUZE/BANDAGES/DRESSINGS) ×3 IMPLANT
TAPE PAPER 3X10 WHT MICROPORE (GAUZE/BANDAGES/DRESSINGS) ×2 IMPLANT
TOWEL OR 17X26 10 PK STRL BLUE (TOWEL DISPOSABLE) ×4 IMPLANT
TUBE CONNECTING 12'X1/4 (SUCTIONS) ×2
TUBE CONNECTING 12X1/4 (SUCTIONS) ×4 IMPLANT
TUBING ARTHROSCOPY IRRIG 16FT (MISCELLANEOUS) ×3 IMPLANT
TUBING REDEUCE PUMP W/CON 8IN (MISCELLANEOUS) ×3 IMPLANT
WATER STERILE IRR 500ML POUR (IV SOLUTION) ×1 IMPLANT
YANKAUER SUCT BULB TIP NO VENT (SUCTIONS) IMPLANT

## 2019-01-07 NOTE — Transfer of Care (Signed)
Immediate Anesthesia Transfer of Care Note  Patient: Erika Lee  Procedure(s) Performed: Left shoulder subacromial decompression, distal clavicle resection, extensive debridement, (Left Shoulder)  Patient Location: PACU  Anesthesia Type:General  Level of Consciousness: awake, alert  and oriented  Airway & Oxygen Therapy: Patient Spontanous Breathing and Patient connected to nasal cannula oxygen  Post-op Assessment: Report given to RN and Post -op Vital signs reviewed and stable  Post vital signs: Reviewed and stable  Last Vitals:  Vitals Value Taken Time  BP 145/68 01/07/19 0851  Temp    Pulse 70 01/07/19 0855  Resp 22 01/07/19 0855  SpO2 95 % 01/07/19 0855  Vitals shown include unvalidated device data.  Last Pain:  Vitals:   01/07/19 0602  TempSrc:   PainSc: 1       Patients Stated Pain Goal: 7 (40/35/24 8185)  Complications: No apparent anesthesia complications

## 2019-01-07 NOTE — Op Note (Signed)
01/07/2019   PATIENT:  Erika Lee    PRE-OPERATIVE DIAGNOSIS:   1.  Left shoulder acromioclavicular arthritis 2.  Left shoulder impingement 3.  Left shoulder rotator cuff tear, partial. 4.  Left shoulder proximal biceps tendinitis  POST-OPERATIVE DIAGNOSIS:   1.  Left shoulder acromioclavicular arthritis 2.  Left shoulder impingement 3.  Left shoulder rotator cuff tear, partial. 4.  Left shoulder proximal biceps tendinitis 5.  Left shoulder glenohumeral arthritis 6.  Left shoulder anterior, posterior, inferior, and superior labrum degenerative tearing  PROCEDURE:   1.  Left shoulder arthroscopic extensive debridement including anterior, superior, posterior labrum, rotator interval, supraspinatus articular sided tear, biceps tenotomy, and extensive subacromial bursectomy 2.  Left shoulder arthroscopic subacromial decompression 3.  Left shoulder arthroscopic distal clavicle resection of 1 cm of distal clavicle 4.  Left shoulder chondroplasty of glenoid fossa  SURGEON:  Nicholes Stairs, MD  PHYSICIAN ASSISTANT:   ANESTHESIA:   General  ESTIMATED BLOOD LOSS: 10 cc  PREOPERATIVE INDICATIONS:  Erika Lee is a  73 y.o. female with a diagnosis of Left shoulder impingement, acromioclavicular osteoarthritis, partial rotator cuff tear who failed conservative measures and elected for surgical management.    The risks benefits and alternatives were discussed with the patient preoperatively including but not limited to the risks of infection, bleeding, nerve injury, cardiopulmonary complications, the need for revision surgery, among others, and the patient was willing to proceed.    OPERATIVE FINDINGS: In the glenohumeral joint she had multiple chondral loose bodies in the sub-scapularis recess as well as the intra-articular joint.  This correlated with some areas of grade III and IV chondromalacia of the inferior posterior glenoid quadrant.  She also had grade III and IV  chondromalacia on the superior aspect of the humeral head.  She had a partial articular sided tear of the supraspinatus of about 20 to 30% tendon thickness.  She had a flattened and partially torn proximal biceps tendon at the labrum.  She had extensive synovitis within the intra-articular joint and mild capsulitis.  In the subacromial space she had a large undersurface spur of the acromion process as well as extremely degenerative distal clavicle with undersurface spur.  Otherwise the subscapularis, infraspinatus, and teres minor were intact without tearing.  There were no bursal sided tears noted.    OPERATIVE PROCEDURE: The patient was brought to the operating room and placed in the supine position. General anesthesia was administered. IV antibiotics were given. General anesthesia was administered.   The upper extremity was examined and found to be grossly unstable particularly to anterior testing. The upper extremity was prepped and draped in the usual sterile fashion. The patient was in a semilateral decubitus position.  Time out was performed. Diagnostic arthroscopy was carried out the above-named findings.   Immediately after anesthesia was placed passive range of motion was checked.  She had forward elevation of 170 degrees, external rotation 90 degrees and internal rotation 90 degrees.  Diagnostic arthroscopy was carried out after establishing a posterior viewing portal in standard position.  Then under direct spinal needle visualization a mid glenoid working portal was established and the intra-articular findings as stated above were noted.   On the intra-articular portion of the procedure we performed extensive debridement.  This included using motorized shaver and radiofrequency wand to debride degenerative labral tearing anteriorly, superiorly, posteriorly, and inferiorly.  We did also perform extensive debridement of the rotator interval as well as release of the anterior inferior capsule  and  middle glenohumeral ligament.  The biceps tendon was tenotomized.   We next performed chondroplasty of the glenoid fossa.  There was multiple loose cartilage bodies in the joint that were removed via motorized shaver.  We then used motorized shaver and grasper to remove unstable cartilage at the posterior inferior quadrant of the glenoid.  Motorized shaver was used to smooth any unstable fragments.  Next we moved into the subacromial space.  We established a lateral working portal off the lateral aspect of the acromion approximately 4 cm and just at the posterior aspect of the distal clavicle.  Working through this portal we performed extensive bursectomy of the subacromial space.  We then did a subperiosteal dissection of the acromion.  She was noted to have a large type II anterior and lateral spur.  We then performed subacromial decompression with partial release of the coracoacromial ligament.  This was accomplished via a cutting block technique and a motorized bur to establish a smooth flat type I acromion.   Finally, we moved to the distal clavicle.  Dissection was carried anterior to the [facility] joint.  While viewing from the lateral portal and working from the anterior portal we performed a distal clavicle resection of 1 cm.  Care was taken to preserve the superior and posterior acromioclavicular ligaments.  Pictures were taken throughout the procedure.  The patient tolerated the procedure well.  Arthroscopic instruments were removed from the shoulder and portal sites were closed with 3-0 nylon.  Standard sterile dressings were applied.  The arm was placed in a sling.  All counts were correct x2.  The patient was transported to PACU in stable condition.  Disposition:  The patient will be nonweightbearing with an abduction sling to the operative extremity.  She may remove the sling and begin early range of motion as tolerated.  She will begin physical therapy in the next 1 week.  She should apply  ice to the shoulder around-the-clock for the next 7 days.  She may remove postoperative bandages in 3 days and begin showering at that time.  I will see them back in the office in 2 weeks for a wound check.

## 2019-01-07 NOTE — Progress Notes (Signed)
Assisted Dr. Hollis with left, ultrasound guided, interscalene  block. Side rails up, monitors on throughout procedure. See vital signs in flow sheet. Tolerated Procedure well. 

## 2019-01-07 NOTE — Anesthesia Procedure Notes (Signed)
Procedure Name: Intubation Date/Time: 01/07/2019 7:40 AM Performed by: Bufford Spikes, CRNA Pre-anesthesia Checklist: Patient identified, Emergency Drugs available, Suction available and Patient being monitored Patient Re-evaluated:Patient Re-evaluated prior to induction Oxygen Delivery Method: Circle system utilized Preoxygenation: Pre-oxygenation with 100% oxygen Induction Type: IV induction Ventilation: Mask ventilation without difficulty Laryngoscope Size: Miller and 2 Grade View: Grade I Tube type: Oral Tube size: 7.0 mm Number of attempts: 1 Airway Equipment and Method: Stylet and Oral airway Placement Confirmation: ETT inserted through vocal cords under direct vision,  positive ETCO2 and breath sounds checked- equal and bilateral Secured at: 20 cm Tube secured with: Tape Dental Injury: Teeth and Oropharynx as per pre-operative assessment

## 2019-01-07 NOTE — Brief Op Note (Signed)
01/07/2019  8:51 AM  PATIENT:  Erika Lee  72 y.o. female  PRE-OPERATIVE DIAGNOSIS:  Left shoulder impingement, acromioclavicular osteoarthritis, partial rotator cuff tear  POST-OPERATIVE DIAGNOSIS:  Left shoulder impingement, acromioclavicular osteoarthritis, partial rotator cuff tear  PROCEDURE:  Procedure(s): Left shoulder subacromial decompression, distal clavicle resection, extensive debridement, (Left)  SURGEON:  Surgeon(s) and Role:    * Nicholes Stairs, MD - Primary  PHYSICIAN ASSISTANT:   ASSISTANTS: none   ANESTHESIA:   regional and general  EBL:  10 mL   BLOOD ADMINISTERED:none  DRAINS: none   LOCAL MEDICATIONS USED:  NONE  SPECIMEN:  No Specimen  DISPOSITION OF SPECIMEN:  N/A  COUNTS:  YES  TOURNIQUET:  * No tourniquets in log *  DICTATION: .Note written in EPIC  PLAN OF CARE: Discharge to home after PACU  PATIENT DISPOSITION:  PACU - hemodynamically stable.   Delay start of Pharmacological VTE agent (>24hrs) due to surgical blood loss or risk of bleeding: not applicable

## 2019-01-07 NOTE — H&P (Signed)
ORTHOPAEDIC H and P  REQUESTING PHYSICIAN: Nicholes Stairs, MD  PCP:  Sinda Du, MD  Chief Complaint: Left shoulder pain.  HPI: Erika Lee is a 73 y.o. female who complains of chronic left shoulder pain and weakness.  She presents today for arthroscopic shoulder surgery on the left side.  She has failed conservative management over the last year and a half.  We have previously discussed this recommendation and surgical risk and benefits in the office.  She has had no new complaints or new issues prior to today's surgery appointment.  Past Medical History:  Diagnosis Date  . Anticoagulant long-term use    eliquis  . Chronic sinusitis   . Essential hypertension, benign   . GERD (gastroesophageal reflux disease)   . Hemorrhoids   . History of colitis 10/2016   campylobacter  . Hyperlipidemia   . Multinodular goiter    per pt has had 2 biopsy's both benign  . Osteoarthritis    "all over"  and AC joint left shoulder  . Paroxysmal atrial fibrillation [facility]) EP cardiologist--  [name]   s/p PVI by [name] 07/16/2013;   pt first dx 2008,  recurrence 2014 afib/flutter and tachy-brady  . Rotator cuff tear, left   . Shoulder impingement, left   . Status post placement of implantable loop recorder    original placement 07-26-2014;  removal / replacement 10-17-2017  by dr allred  . Type 2 diabetes mellitus ([place])    followed by pcp   Past Surgical History:  Procedure Laterality Date  . ATRIAL FIBRILLATION ABLATION N/A 07/16/2013   PVI and CTI ablation by [name]  . COLONOSCOPY  09/10/2011   Dr. Arnoldo Morale: normal, 10 year follow up.  . LESION REMOVAL N/A 05/03/2013   Procedure: EXCISION CYST, BACK;  Surgeon: Jamesetta So, MD;  Location: [facility] ORS;  Service: General;  Laterality: N/A;  . LOOP RECORDER IMPLANT N/A 07/26/2014   Procedure: LOOP RECORDER IMPLANT;  Surgeon: Thompson Grayer, MD;  Location: [facility] CATH LAB;  Service: Cardiovascular;  Laterality: N/A;  . LOOP RECORDER  INSERTION N/A 10/17/2017   MDT previously implanted ILR for afib management at [facility].  old device removed and new device placed by [name]  . LOOP RECORDER REMOVAL N/A 10/17/2017   MDT ILR removed with new device subsequently replaced  . TEE WITHOUT CARDIOVERSION N/A 07/15/2013   Procedure: TRANSESOPHAGEAL ECHOCARDIOGRAM (TEE);  Surgeon: Lelon Perla, MD;  Location: [facility] ENDOSCOPY;  Service: Cardiovascular;  Laterality: N/A;  . TONSILLECTOMY  age 34  . TRANSANAL HEMORRHOIDAL DEARTERIALIZATION N/A 09/22/2015   Procedure: TRANSANAL HEMORRHOIDAL LIGATION/PEXY EUA POSSIBLE HEMORRHOIDECTOMY ;  Surgeon: Michael Boston, MD;  Location: [facility] ORS;  Service: General;  Laterality: N/A;   Social History   Socioeconomic History  . Marital status: Married    Spouse name: Not on file  . Number of children: Not on file  . Years of education: Not on file  . Highest education level: Not on file  Occupational History  . Occupation: Pensions consultant: RETIRED    Comment: Full time  Social Needs  . Financial resource strain: Not on file  . Food insecurity    Worry: Not on file    Inability: Not on file  . Transportation needs    Medical: Not on file    Non-medical: Not on file  Tobacco Use  . Smoking status: Former Smoker    Years: 34.00    Types: Cigarettes  Quit date: 01/02/1994    Years since quitting: 25.0  . Smokeless tobacco: Never Used  Substance and Sexual Activity  . Alcohol use: No  . Drug use: No  . Sexual activity: Not on file  Lifestyle  . Physical activity    Days per week: Not on file    Minutes per session: Not on file  . Stress: Not on file  Relationships  . Social Herbalist on phone: Not on file    Gets together: Not on file    Attends religious service: Not on file    Active member of club or organization: Not on file    Attends meetings of clubs or organizations: Not on file    Relationship status: Not on file  Other Topics Concern  . Not on file   Social History Narrative   Lives with spouse in [place]   Family History  Problem Relation Age of Onset  . Stroke Mother 43  . Stroke Maternal Grandmother        stroke  . Other Maternal Grandfather        "heart dropsy" kidney issues  . Kidney disease Maternal Grandfather   . Colon cancer Neg Hx    Allergies  Allergen Reactions  . Clindamycin/Lincomycin Rash  . Doxycycline Other (See Comments)    Makes heart race  . Keflex [Cephalexin] Nausea And Vomiting  . Adhesive [Tape] Rash  . Latex Rash  . Sulfonamide Derivatives Nausea And Vomiting   Prior to Admission medications   Medication Sig Start Date End Date Taking? Authorizing Provider  apixaban (ELIQUIS) 5 MG TABS tablet Take 1 tablet (5 mg total) by mouth 2 (two) times daily. Patient taking differently: Take 5 mg by mouth 2 (two) times daily.  09/12/17  Yes Allred, Jeneen Rinks, MD  esomeprazole (NEXIUM) 20 MG capsule Take 20 mg by mouth daily.    Yes [provider]  hydrochlorothiazide 25 MG tablet Take 25 mg by mouth daily.    Yes [provider]  HYDROcodone-acetaminophen (NORCO) 7.5-325 MG tablet Take 1 tablet by mouth every 6 (six) hours as needed for moderate pain.   Yes [provider]  lisinopril (PRINIVIL,ZESTRIL) 20 MG tablet TAKE 1 TABLET BY MOUTH DAILY Patient taking differently: Take 20 mg by mouth daily.  06/01/18  Yes Allred, Jeneen Rinks, MD  metFORMIN (GLUCOPHAGE) 500 MG tablet Take 1 tablet (500 mg total) by mouth daily. Patient taking differently: Take 500 mg by mouth daily.  07/17/13  Yes Brett Canales, PA-C  metoprolol succinate (TOPROL-XL) 25 MG 24 hr tablet Take 0.5 tablets (12.5 mg total) by mouth daily. Patient taking differently: Take 12.5 mg by mouth daily.  01/21/18  Yes Allred, Jeneen Rinks, MD  Potassium Gluconate 550 MG TABS Take 550 mg by mouth at bedtime.    Yes [provider]  simvastatin (ZOCOR) 40 MG tablet Take 40 mg by mouth at bedtime.    Yes [provider]    No results found.  Positive ROS: All other systems have been reviewed and were otherwise negative with the exception of those mentioned in the HPI and as above.  Physical Exam: General: Alert, no acute distress Cardiovascular: No pedal edema Respiratory: No cyanosis, no use of accessory musculature GI: No organomegaly, abdomen is soft and non-tender Skin: No lesions in the area of chief complaint Neurologic: Sensation intact distally Psychiatric: Patient is competent for consent with normal mood and affect Lymphatic: No axillary or cervical lymphadenopathy  MUSCULOSKELETAL:  Left shoulder is clean, dry, and intact.  No open wounds.  Peripheral nerve block in place.  Skin is warm and well-perfused.  Assessment: 1.  Left shoulder proximal biceps tendinopathy 2.  Left shoulder subacromial impingement 3.  Left shoulder acromioclavicular joint arthritis  Plan: -Plan for left shoulder arthroscopy today to include extensive debridement, biceps tenotomy, subacromial decompression, and distal clavicle resection.  Possible rotator cuff repair depending on the size of the articular sided tear.  We again reviewed the risk and benefits of this procedure at the bedside.  All questions were solicited and answered to her satisfaction.  She has provided informed consent to proceed.  -We will plan for discharge home from PACU in a sling to the left arm.    Nicholes Stairs, MD Cell [phone]    01/07/2019 7:12 AM

## 2019-01-07 NOTE — Anesthesia Procedure Notes (Signed)
Anesthesia Regional Block: Interscalene brachial plexus block   Pre-Anesthetic Checklist: ,, timeout performed, Correct Patient, Correct Site, Correct Laterality, Correct Procedure, Correct Position, site marked, Risks and benefits discussed,  Surgical consent,  Pre-op evaluation,  At surgeon's request and post-op pain management  Laterality: Left  Prep: chloraprep       Needles:  Injection technique: Single-shot  Needle Type: Echogenic Stimulator Needle     Needle Length: 9cm  Needle Gauge: 21     Additional Needles:   Procedures:,,,, ultrasound used (permanent image in chart),,,,  Narrative:  Start time: 01/07/2019 6:45 AM End time: 01/07/2019 6:55 AM Injection made incrementally with aspirations every 5 mL.  Performed by: Personally  Anesthesiologist: Effie Berkshire, MD  Additional Notes: Patient tolerated the procedure well. Local anesthetic introduced in an incremental fashion under minimal resistance after negative aspirations. No paresthesias were elicited. After completion of the procedure, no acute issues were identified and patient continued to be monitored by RN.

## 2019-01-07 NOTE — Anesthesia Postprocedure Evaluation (Signed)
Anesthesia Post Note  Patient: Erika Lee  Procedure(s) Performed: Left shoulder subacromial decompression, distal clavicle resection, extensive debridement, (Left Shoulder)     Patient location during evaluation: PACU Anesthesia Type: General Level of consciousness: awake and alert Pain management: pain level controlled Vital Signs Assessment: post-procedure vital signs reviewed and stable Respiratory status: spontaneous breathing, nonlabored ventilation, respiratory function stable and patient connected to nasal cannula oxygen Cardiovascular status: blood pressure returned to baseline and stable Postop Assessment: no apparent nausea or vomiting Anesthetic complications: no    Last Vitals:  Vitals:   01/07/19 0957 01/07/19 1000  BP:  135/82  Pulse: 65 61  Resp: (!) 25 14  Temp:    SpO2: 94% 93%    Last Pain:  Vitals:   01/07/19 1020  TempSrc:   PainSc: 0-No pain                 Effie Berkshire

## 2019-01-07 NOTE — Discharge Instructions (Signed)
Post Anesthesia Home Care Instructions  Activity: Get plenty of rest for the remainder of the day. A responsible adult should stay with you for 24 hours following the procedure.  For the next 24 hours, DO NOT: -Drive a car -Paediatric nurse -Drink alcoholic beverages -Take any medication unless instructed by your physician -Make any legal decisions or sign important papers.  Meals: Start with liquid foods such as gelatin or soup. Progress to regular foods as tolerated. Avoid greasy, spicy, heavy foods. If nausea and/or vomiting occur, drink only clear liquids until the nausea and/or vomiting subsides. Call your physician if vomiting continues.  Special Instructions/Symptoms: Your throat may feel dry or sore from the anesthesia or the breathing tube placed in your throat during surgery. If this causes discomfort, gargle with warm salt water. The discomfort should disappear within 24 hours.  If you had a scopolamine patch placed behind your ear for the management of post- operative nausea and/or vomiting:  1. The medication in the patch is effective for 72 hours, after which it should be removed.  Wrap patch in a tissue and discard in the trash. Wash hands thoroughly with soap and water. 2. You may remove the patch earlier than 72 hours if you experience unpleasant side effects which may include dry mouth, dizziness or visual disturbances. 3. Avoid touching the patch. Wash your hands with soap and water after contact with the patch.   Regional Anesthesia Blocks  1. Numbness or the inability to move the "blocked" extremity may last from 3-48 hours after placement. The length of time depends on the medication injected and your individual response to the medication. If the numbness is not going away after 48 hours, call your surgeon.  2. The extremity that is blocked will need to be protected until the numbness is gone and the  Strength has returned. Because you cannot feel it, you will need  to take extra care to avoid injury. Because it may be weak, you may have difficulty moving it or using it. You may not know what position it is in without looking at it while the block is in effect.  3. For blocks in the legs and feet, returning to weight bearing and walking needs to be done carefully. You will need to wait until the numbness is entirely gone and the strength has returned. You should be able to move your leg and foot normally before you try and bear weight or walk. You will need someone to be with you when you first try to ensure you do not fall and possibly risk injury.  4. Bruising and tenderness at the needle site are common side effects and will resolve in a few days.  5. Persistent numbness or new problems with movement should be communicated to the surgeon or the [facility] [phone] [place] [phone]).-Maintain sling to left arm for comfort only.  You may remove the arm and begin using it out of the sling as tolerated as soon as your nerve block has resolved.  Should you continue with postoperative pain the sling is appropriate for comfort.  -Apply ice to the left shoulder for 20 to 30 minutes at a time for every hour that you are awake.  This should be done fairly regularly for the first 7 days.  -For mild to moderate pain use Tylenol and/or Advil as directed over-the-counter.  For breakthrough pain use Norco as needed.  For nausea and vomiting use Zofran as directed.  These  prescriptions will be available at your pharmacy.  -You may remove your postoperative bandage in 3 days and begin showering at that time.  You should pat your arm dry following shower and place Band-Aids over your portal sites until your follow-up appointment in 2 weeks.  -Return to see Dr. Stann Mainland in 2 weeks for routine postoperative care. Information for Discharge Teaching: EXPAREL (bupivacaine liposome injectable suspension)   Your surgeon or anesthesiologist  gave you EXPAREL(bupivacaine) to help control your pain after surgery.   EXPAREL is a local anesthetic that provides pain relief by numbing the tissue around the surgical site.  EXPAREL is designed to release pain medication over time and can control pain for up to 72 hours.  Depending on how you respond to EXPAREL, you may require less pain medication during your recovery.  Possible side effects:  Temporary loss of sensation or ability to move in the area where bupivacaine was injected.  Nausea, vomiting, constipation  Rarely, numbness and tingling in your mouth or lips, lightheadedness, or anxiety may occur.  Call your doctor right away if you think you may be experiencing any of these sensations, or if you have other questions regarding possible side effects.  Follow all other discharge instructions given to you by your surgeon or nurse. Eat a healthy diet and drink plenty of water or other fluids.  If you return to the hospital for any reason within 96 hours following the administration of EXPAREL, it is important for health care providers to know that you have received this anesthetic. A teal colored band has been placed on your arm with the date, time and amount of EXPAREL you have received in order to alert and inform your health care providers. Please leave this armband in place for the full 96 hours following administration, and then you may remove the band.

## 2019-01-09 ENCOUNTER — Other Ambulatory Visit: Payer: Self-pay

## 2019-01-09 ENCOUNTER — Encounter (HOSPITAL_COMMUNITY): Payer: Self-pay | Admitting: Emergency Medicine

## 2019-01-09 ENCOUNTER — Inpatient Hospital Stay: Admission: AD | Admit: 2019-01-09 | Payer: PPO | Source: Other Acute Inpatient Hospital | Admitting: Internal Medicine

## 2019-01-09 ENCOUNTER — Inpatient Hospital Stay (HOSPITAL_COMMUNITY)
Admission: EM | Admit: 2019-01-09 | Discharge: 2019-01-11 | DRG: 502 | Disposition: A | Discharge status: Home / Self Care | Payer: PPO | Attending: Internal Medicine | Admitting: Internal Medicine

## 2019-01-09 ENCOUNTER — Emergency Department (HOSPITAL_COMMUNITY): Payer: PPO

## 2019-01-09 DIAGNOSIS — J329 Chronic sinusitis, unspecified: Secondary | ICD-10-CM | POA: Diagnosis present

## 2019-01-09 DIAGNOSIS — E785 Hyperlipidemia, unspecified: Secondary | ICD-10-CM | POA: Diagnosis present

## 2019-01-09 DIAGNOSIS — Z888 Allergy status to other drugs, medicaments and biological substances status: Secondary | ICD-10-CM | POA: Diagnosis not present

## 2019-01-09 DIAGNOSIS — I10 Essential (primary) hypertension: Secondary | ICD-10-CM

## 2019-01-09 DIAGNOSIS — I119 Hypertensive heart disease without heart failure: Secondary | ICD-10-CM | POA: Diagnosis present

## 2019-01-09 DIAGNOSIS — I214 Non-ST elevation (NSTEMI) myocardial infarction: Secondary | ICD-10-CM

## 2019-01-09 DIAGNOSIS — K219 Gastro-esophageal reflux disease without esophagitis: Secondary | ICD-10-CM | POA: Diagnosis present

## 2019-01-09 DIAGNOSIS — Z79899 Other long term (current) drug therapy: Secondary | ICD-10-CM

## 2019-01-09 DIAGNOSIS — R9431 Abnormal electrocardiogram [ECG] [EKG]: Secondary | ICD-10-CM | POA: Diagnosis not present

## 2019-01-09 DIAGNOSIS — M25812 Other specified joint disorders, left shoulder: Secondary | ICD-10-CM | POA: Diagnosis present

## 2019-01-09 DIAGNOSIS — M75102 Unspecified rotator cuff tear or rupture of left shoulder, not specified as traumatic: Secondary | ICD-10-CM | POA: Diagnosis present

## 2019-01-09 DIAGNOSIS — I4891 Unspecified atrial fibrillation: Secondary | ICD-10-CM | POA: Diagnosis not present

## 2019-01-09 DIAGNOSIS — M19012 Primary osteoarthritis, left shoulder: Principal | ICD-10-CM | POA: Diagnosis present

## 2019-01-09 DIAGNOSIS — I480 Paroxysmal atrial fibrillation: Secondary | ICD-10-CM | POA: Diagnosis present

## 2019-01-09 DIAGNOSIS — E119 Type 2 diabetes mellitus without complications: Secondary | ICD-10-CM | POA: Diagnosis present

## 2019-01-09 DIAGNOSIS — Z7984 Long term (current) use of oral hypoglycemic drugs: Secondary | ICD-10-CM | POA: Diagnosis not present

## 2019-01-09 DIAGNOSIS — Z87891 Personal history of nicotine dependence: Secondary | ICD-10-CM | POA: Diagnosis not present

## 2019-01-09 DIAGNOSIS — Z881 Allergy status to other antibiotic agents status: Secondary | ICD-10-CM

## 2019-01-09 DIAGNOSIS — Z91048 Other nonmedicinal substance allergy status: Secondary | ICD-10-CM | POA: Diagnosis not present

## 2019-01-09 DIAGNOSIS — Z91040 Latex allergy status: Secondary | ICD-10-CM

## 2019-01-09 DIAGNOSIS — R079 Chest pain, unspecified: Secondary | ICD-10-CM | POA: Diagnosis not present

## 2019-01-09 DIAGNOSIS — Z20828 Contact with and (suspected) exposure to other viral communicable diseases: Secondary | ICD-10-CM | POA: Diagnosis present

## 2019-01-09 DIAGNOSIS — Z7902 Long term (current) use of antithrombotics/antiplatelets: Secondary | ICD-10-CM

## 2019-01-09 DIAGNOSIS — R7989 Other specified abnormal findings of blood chemistry: Secondary | ICD-10-CM | POA: Diagnosis not present

## 2019-01-09 DIAGNOSIS — Z882 Allergy status to sulfonamides status: Secondary | ICD-10-CM

## 2019-01-09 DIAGNOSIS — Z823 Family history of stroke: Secondary | ICD-10-CM

## 2019-01-09 DIAGNOSIS — M7520 Bicipital tendinitis, unspecified shoulder: Secondary | ICD-10-CM | POA: Diagnosis present

## 2019-01-09 HISTORY — DX: Non-ST elevation (NSTEMI) myocardial infarction: I21.4

## 2019-01-09 LAB — CBC
HCT: 38.7 % (ref 36.0–46.0)
Hemoglobin: 13 g/dL (ref 12.0–15.0)
MCH: 29.7 pg (ref 26.0–34.0)
MCHC: 33.6 g/dL (ref 30.0–36.0)
MCV: 88.4 fL (ref 80.0–100.0)
Platelets: 177 10*3/uL (ref 150–400)
RBC: 4.38 MIL/uL (ref 3.87–5.11)
RDW: 11.9 % (ref 11.5–15.5)
WBC: 9.4 10*3/uL (ref 4.0–10.5)
nRBC: 0 % (ref 0.0–0.2)

## 2019-01-09 LAB — BASIC METABOLIC PANEL
Anion gap: 9 (ref 5–15)
BUN: 8 mg/dL (ref 8–23)
CO2: 26 mmol/L (ref 22–32)
Calcium: 9.1 mg/dL (ref 8.9–10.3)
Chloride: 99 mmol/L (ref 98–111)
Creatinine, Ser: 0.6 mg/dL (ref 0.44–1.00)
GFR calc Af Amer: >60 mL/min (ref >60)
GFR calc non Af Amer: >60 mL/min (ref >60)
Glucose, Bld: 163 mg/dL — ABNORMAL HIGH (ref 70–99)
Potassium: 3.1 mmol/L — ABNORMAL LOW (ref 3.5–5.1)
Sodium: 134 mmol/L — ABNORMAL LOW (ref 135–145)

## 2019-01-09 LAB — TROPONIN I (HIGH SENSITIVITY)
Troponin I (High Sensitivity): 375 ng/L (ref <18)
Troponin I (High Sensitivity): 434 ng/L (ref <18)

## 2019-01-09 LAB — SARS CORONAVIRUS 2 BY RT PCR (HOSPITAL ORDER, PERFORMED IN ~~LOC~~ HOSPITAL LAB): SARS Coronavirus 2: NEGATIVE

## 2019-01-09 MED ORDER — NITROGLYCERIN 0.4 MG SL SUBL
0.4000 mg | SUBLINGUAL_TABLET | SUBLINGUAL | Status: DC | PRN
  Administered 2019-01-09: 0.4 mg via SUBLINGUAL
  Filled 2019-01-09: qty 1

## 2019-01-09 MED ORDER — INSULIN ASPART 100 UNIT/ML ~~LOC~~ SOLN
0.0000 [IU] | Freq: Three times a day (TID) | SUBCUTANEOUS | Status: DC
  Administered 2019-01-10 – 2019-01-11 (×3): 2 [IU] via SUBCUTANEOUS

## 2019-01-09 MED ORDER — POTASSIUM CHLORIDE 10 MEQ/100ML IV SOLN
10.0000 meq | Freq: Once | INTRAVENOUS | Status: AC
  Administered 2019-01-09: 10 meq via INTRAVENOUS
  Filled 2019-01-09: qty 100

## 2019-01-09 MED ORDER — LISINOPRIL 20 MG PO TABS
20.0000 mg | ORAL_TABLET | Freq: Every day | ORAL | Status: DC
  Administered 2019-01-10 – 2019-01-11 (×2): 20 mg via ORAL
  Filled 2019-01-09 (×2): qty 1

## 2019-01-09 MED ORDER — ACETAMINOPHEN 325 MG PO TABS
650.0000 mg | ORAL_TABLET | ORAL | Status: DC | PRN
  Administered 2019-01-09 – 2019-01-11 (×3): 650 mg via ORAL
  Filled 2019-01-09 (×3): qty 2

## 2019-01-09 MED ORDER — METOPROLOL SUCCINATE ER 25 MG PO TB24
12.5000 mg | ORAL_TABLET | Freq: Every day | ORAL | Status: DC
  Administered 2019-01-10 – 2019-01-11 (×2): 12.5 mg via ORAL
  Filled 2019-01-09 (×2): qty 1

## 2019-01-09 MED ORDER — ATORVASTATIN CALCIUM 40 MG PO TABS
40.0000 mg | ORAL_TABLET | Freq: Every day | ORAL | Status: DC
  Administered 2019-01-10: 17:00:00 40 mg via ORAL
  Filled 2019-01-09: qty 1

## 2019-01-09 MED ORDER — SODIUM CHLORIDE 0.9% FLUSH: 3.0000 mL | Freq: Once | INTRAVENOUS | Status: DC

## 2019-01-09 MED ORDER — HYDROCODONE-ACETAMINOPHEN 5-325 MG PO TABS
2.0000 | ORAL_TABLET | Freq: Once | ORAL | Status: AC
  Administered 2019-01-09: 2 via ORAL
  Filled 2019-01-09: qty 2

## 2019-01-09 MED ORDER — IOHEXOL 350 MG/ML SOLN
75.0000 mL | Freq: Once | INTRAVENOUS | Status: AC | PRN
  Administered 2019-01-09: 17:00:00 via INTRAVENOUS

## 2019-01-09 MED ORDER — INSULIN ASPART 100 UNIT/ML ~~LOC~~ SOLN: 0.0000 [IU] | Freq: Every day | SUBCUTANEOUS | Status: DC

## 2019-01-09 MED ORDER — OXYCODONE HCL 5 MG PO TABS: 5.0000 mg | ORAL_TABLET | Freq: Four times a day (QID) | ORAL | Status: DC | PRN

## 2019-01-09 MED ORDER — ASPIRIN 81 MG PO CHEW
81.0000 mg | CHEWABLE_TABLET | Freq: Every day | ORAL | Status: DC
  Administered 2019-01-10 – 2019-01-11 (×2): 81 mg via ORAL
  Filled 2019-01-09 (×2): qty 1

## 2019-01-09 MED ORDER — MORPHINE SULFATE (PF) 2 MG/ML IV SOLN
2.0000 mg | Freq: Once | INTRAVENOUS | Status: AC
  Administered 2019-01-09: 2 mg via INTRAVENOUS
  Filled 2019-01-09: qty 1

## 2019-01-09 MED ORDER — ASPIRIN 81 MG PO CHEW
324.0000 mg | CHEWABLE_TABLET | Freq: Once | ORAL | Status: AC
  Administered 2019-01-09: 324 mg via ORAL
  Filled 2019-01-09: qty 4

## 2019-01-09 MED ORDER — HEPARIN (PORCINE) 25000 UT/250ML-% IV SOLN
1200.0000 [IU]/h | INTRAVENOUS | Status: DC
  Administered 2019-01-09: 850 [IU]/h via INTRAVENOUS
  Administered 2019-01-10: 1050 [IU]/h via INTRAVENOUS
  Filled 2019-01-09 (×2): qty 250

## 2019-01-09 MED ORDER — ONDANSETRON HCL 4 MG/2ML IJ SOLN: 4.0000 mg | Freq: Four times a day (QID) | INTRAMUSCULAR | Status: DC | PRN

## 2019-01-09 MED ORDER — POTASSIUM CHLORIDE CRYS ER 20 MEQ PO TBCR
40.0000 meq | EXTENDED_RELEASE_TABLET | Freq: Once | ORAL | Status: AC
  Administered 2019-01-09: 40 meq via ORAL
  Filled 2019-01-09: qty 2

## 2019-01-09 MED ORDER — HEPARIN SODIUM (PORCINE) 5000 UNIT/ML IJ SOLN
4000.0000 [IU] | Freq: Once | INTRAMUSCULAR | Status: AC
  Administered 2019-01-09: 4000 [IU] via INTRAVENOUS
  Filled 2019-01-09: qty 1

## 2019-01-09 MED ORDER — HYDROCODONE-ACETAMINOPHEN 7.5-325 MG PO TABS: 1.0000 | ORAL_TABLET | Freq: Four times a day (QID) | ORAL | Status: DC | PRN

## 2019-01-09 NOTE — ED Notes (Signed)
Patient transported to CT 

## 2019-01-09 NOTE — ED Notes (Signed)
ED TO INPATIENT HANDOFF REPORT  ED Nurse Name and Phone #: [phone]  S Name/Age/Gender Erika Lee 73 y.o. female Room/Bed: APA03/APA03  Code Status   Code Status: Prior  Home/SNF/Other HOME Patient oriented to: self, place, time and situation Is this baseline? Yes   Triage Complete: Triage complete  Chief Complaint Chest Pain; Shoulder Pain  Triage Note Pain in center of chest x 1 hour ago.  Started after eating.  Reports coughing a lot since her shoulder surgery 2 days.     Allergies Allergies  Allergen Reactions  . Clindamycin/Lincomycin Rash  . Doxycycline Other (See Comments)    Makes heart race  . Keflex [Cephalexin] Nausea And Vomiting  . Adhesive [Tape] Rash  . Latex Rash  . Sulfonamide Derivatives Nausea And Vomiting    Level of Care/Admitting Diagnosis ED Disposition    ED Disposition Condition [facility]: [place] [[id number]]  Level of Care: Telemetry Cardiac [103]  Covid Evaluation: Asymptomatic Screening Protocol (No Symptoms)  Diagnosis: Chest pain [154008]  Admitting Physician: Elouise Munroe [[id number]]  Attending Physician: Elouise Munroe [[id number]]  Estimated length of stay: past midnight tomorrow  Certification:: I certify this patient will need inpatient services for at least 2 midnights  PT Class (Do Not Modify): Inpatient [101]  PT Acc Code (Do Not Modify): Private [1]       B Medical/Surgery History Past Medical History:  Diagnosis Date  . Anticoagulant long-term use    eliquis  . Chronic sinusitis   . Essential hypertension, benign   . GERD (gastroesophageal reflux disease)   . Hemorrhoids   . History of colitis 10/2016   campylobacter  . Hyperlipidemia   . Multinodular goiter    per pt has had 2 biopsy's both benign  . Osteoarthritis    "all over"  and AC joint left shoulder  . Paroxysmal atrial fibrillation [facility]) EP cardiologist--  [name]   s/p PVI by [name] 07/16/2013;   pt  first dx 2008,  recurrence 2014 afib/flutter and tachy-brady  . Rotator cuff tear, left   . Shoulder impingement, left   . Status post placement of implantable loop recorder    original placement 07-26-2014;  removal / replacement 10-17-2017  by dr allred  . Type 2 diabetes mellitus ([place])    followed by pcp   Past Surgical History:  Procedure Laterality Date  . ATRIAL FIBRILLATION ABLATION N/A 07/16/2013   PVI and CTI ablation by [name]  . COLONOSCOPY  09/10/2011   Dr. Arnoldo Morale: normal, 10 year follow up.  . LESION REMOVAL N/A 05/03/2013   Procedure: EXCISION CYST, BACK;  Surgeon: Jamesetta So, MD;  Location: [facility];  Service: General;  Laterality: N/A;  . LOOP RECORDER IMPLANT N/A 07/26/2014   Procedure: LOOP RECORDER IMPLANT;  Surgeon: Thompson Grayer, MD;  Location: [facility] CATH LAB;  Service: Cardiovascular;  Laterality: N/A;  . LOOP RECORDER INSERTION N/A 10/17/2017   MDT previously implanted ILR for afib management at [facility].  old device removed and new device placed by [name]  . LOOP RECORDER REMOVAL N/A 10/17/2017   MDT ILR removed with new device subsequently replaced  . TEE WITHOUT CARDIOVERSION N/A 07/15/2013   Procedure: TRANSESOPHAGEAL ECHOCARDIOGRAM (TEE);  Surgeon: Lelon Perla, MD;  Location: [facility] ENDOSCOPY;  Service: Cardiovascular;  Laterality: N/A;  . TONSILLECTOMY  age 47  . TRANSANAL HEMORRHOIDAL DEARTERIALIZATION N/A 09/22/2015   Procedure: TRANSANAL HEMORRHOIDAL LIGATION/PEXY EUA POSSIBLE HEMORRHOIDECTOMY ;  Surgeon: Michael Boston, MD;  Location: [facility] ORS;  Service: General;  Laterality: N/A;     A IV Location/Drains/Wounds Patient Lines/Drains/Airways Status   Active Line/Drains/Airways    Name:   Placement date:   Placement time:   Site:   Days:   Peripheral IV 01/09/19 Right Antecubital   01/09/19    1316    Antecubital   less than 1   Peripheral IV 01/09/19 Right Wrist   01/09/19    1634    Wrist   less than 1   Incision (Closed) 01/07/19 Shoulder Left   01/07/19     0816     2          Intake/Output Last 24 hours No intake or output data in the 24 hours ending 01/09/19 1704  Labs/Imaging Results for orders placed or performed during the hospital encounter of 01/09/19 (from the past 48 hour(s))  Basic metabolic panel     Status: Abnormal   Collection Time: 01/09/19  2:13 PM  Result Value Ref Range   Sodium 134 (L) 135 - 145 mmol/L   Potassium 3.1 (L) 3.5 - 5.1 mmol/L   Chloride 99 98 - 111 mmol/L   CO2 26 22 - 32 mmol/L   Glucose, Bld 163 (H) 70 - 99 mg/dL   BUN 8 8 - 23 mg/dL   Creatinine, Ser 0.60 0.44 - 1.00 mg/dL   Calcium 9.1 8.9 - 10.3 mg/dL   GFR calc non Af Amer >60 >60 mL/min   GFR calc Af Amer >60 >60 mL/min   Anion gap 9 5 - 15    Comment: Performed at [facility], [address], [place], [place] [address]  CBC     Status: None   Collection Time: 01/09/19  2:13 PM  Result Value Ref Range   WBC 9.4 4.0 - 10.5 K/uL   RBC 4.38 3.87 - 5.11 MIL/uL   Hemoglobin 13.0 12.0 - 15.0 g/dL   HCT 38.7 36.0 - 46.0 %   MCV 88.4 80.0 - 100.0 fL   MCH 29.7 26.0 - 34.0 pg   MCHC 33.6 30.0 - 36.0 g/dL   RDW 11.9 11.5 - 15.5 %   Platelets 177 150 - 400 K/uL   nRBC 0.0 0.0 - 0.2 %    Comment: Performed at [facility], [address], [place], [place] [address]  Troponin I (High Sensitivity)     Status: Abnormal   Collection Time: 01/09/19  2:13 PM  Result Value Ref Range   Troponin I (High Sensitivity) 375.00 (HH) <18 ng/L    Comment: CRITICAL RESULT CALLED TO, READ BACK BY AND VERIFIED WITH: H. CRAWFORD, RN@1454   07/11/2020KAY Performed at [facility], [address] [place], [place] [address]   SARS Coronavirus 2 (CEPHEID - Performed in [place] hospital lab), Hosp Order     Status: None   Collection Time: 01/09/19  3:35 PM   Specimen: Nasopharyngeal Swab  Result Value Ref Range   SARS Coronavirus 2 NEGATIVE NEGATIVE    Comment: (NOTE) If result is NEGATIVE SARS-CoV-2 target nucleic acids are NOT DETECTED. The SARS-CoV-2 RNA is  generally detectable in upper and lower  respiratory specimens during the acute phase of infection. The lowest  concentration of SARS-CoV-2 viral copies this assay can detect is 250  copies / mL. A negative result does not preclude SARS-CoV-2 infection  and should not be used as the sole basis for treatment or other  patient management decisions.  A negative result  may occur with  improper specimen collection / handling, submission of specimen other  than nasopharyngeal swab, presence of viral mutation(s) within the  areas targeted by this assay, and inadequate number of viral copies  (<250 copies / mL). A negative result must be combined with clinical  observations, patient history, and epidemiological information. If result is POSITIVE SARS-CoV-2 target nucleic acids are DETECTED. The SARS-CoV-2 RNA is generally detectable in upper and lower  respiratory specimens dur ing the acute phase of infection.  Positive  results are indicative of active infection with SARS-CoV-2.  Clinical  correlation with patient history and other diagnostic information is  necessary to determine patient infection status.  Positive results do  not rule out bacterial infection or co-infection with other viruses. If result is PRESUMPTIVE POSTIVE SARS-CoV-2 nucleic acids MAY BE PRESENT.   A presumptive positive result was obtained on the submitted specimen  and confirmed on repeat testing.  While 2019 novel coronavirus  (SARS-CoV-2) nucleic acids may be present in the submitted sample  additional confirmatory testing may be necessary for epidemiological  and / or clinical management purposes  to differentiate between  SARS-CoV-2 and other Sarbecovirus currently known to infect humans.  If clinically indicated additional testing with an alternate test  methodology [phone]) is advised. The SARS-CoV-2 RNA is generally  detectable in upper and lower respiratory sp ecimens during the acute  phase of  infection. The expected result is Negative. Fact Sheet for Patients:  StrictlyIdeas.no Fact Sheet for Healthcare Providers: BankingDealers.co.za This test is not yet approved or cleared by the [place] FDA and has been authorized for detection and/or diagnosis of SARS-CoV-2 by FDA under an Emergency Use Authorization (EUA).  This EUA will remain in effect (meaning this test can be used) for the duration of the COVID-19 declaration under Section 564(b)(1) of the Act, 21 U.S.C. section 360bbb-3(b)(1), unless the authorization is terminated or revoked sooner. Performed at [facility], [address], [place], [place] [phone]   Troponin I (High Sensitivity)     Status: Abnormal   Collection Time: 01/09/19  4:07 PM  Result Value Ref Range   Troponin I (High Sensitivity) 434.00 (HH) <18 ng/L    Comment: CRITICAL RESULT CALLED TO, READ BACK BY AND VERIFIED WITH: M. CREWS,RN @1653   07/11/2020KAY Performed at [facility], [address] [place], [place] [address]    Dg Chest Port 1 View  Result Date: 01/09/2019 CLINICAL DATA:  Chest pain today. EXAM: PORTABLE CHEST 1 VIEW COMPARISON:  PA and lateral chest 02/21/2016. FINDINGS: There is cardiomegaly and vascular congestion. No consolidative process, pneumothorax or effusion. Aortic atherosclerosis noted. No acute or focal bony abnormality. IMPRESSION: Cardiomegaly and pulmonary vascular congestion. Electronically Signed   By: Inge Rise M.D.   On: 01/09/2019 15:12    Pending Labs Unresulted Labs (From admission, onward)    Start     Ordered   01/10/19 0500  Heparin level (unfractionated)  Daily,   R     01/09/19 1539   01/10/19 0500  APTT  Daily,   R     01/09/19 1539   01/10/19 0500  CBC  Daily,   R     01/09/19 1539   01/09/19 2300  APTT  Once-Timed,   STAT     01/09/19 1539          Vitals/Pain Today's Vitals   01/09/19 1500 01/09/19 1530 01/09/19 1652 01/09/19 1700   BP: 138/73 135/66  (!) 155/83  Pulse: (!) 52 (!)  50  (!) 57  Resp: 20 15  17   Temp:      TempSrc:      SpO2: 95% 95%  97%  Weight:      Height:      PainSc:   0-No pain     Isolation Precautions No active isolations  Medications Medications  sodium chloride flush (NS) 0.9 % injection 3 mL (3 mLs Intravenous Not Given 01/09/19 1446)  potassium chloride 10 mEq in 100 mL IVPB (10 mEq Intravenous New Bag/Given 01/09/19 1648)  heparin ADULT infusion 100 units/mL (25000 units/294mL sodium chloride 0.45%) (850 Units/hr Intravenous New Bag/Given 01/09/19 1634)  potassium chloride SA (K-DUR) CR tablet 40 mEq (40 mEq Oral Given 01/09/19 1541)  heparin injection 4,000 Units (4,000 Units Intravenous Given 01/09/19 1545)  aspirin chewable tablet 324 mg (324 mg Oral Given 01/09/19 1541)  iohexol (OMNIPAQUE) 350 MG/ML injection 75 mL ( Intravenous Contrast Given 01/09/19 1703)    Mobility walks Low fall risk   Focused Assessments    R Recommendations: See Admitting Provider Note  Report given to:   Additional Notes: Heparin 850 units Kt 100 IV

## 2019-01-09 NOTE — Progress Notes (Signed)
ANTICOAGULATION CONSULT NOTE - Initial Consult  Pharmacy Consult for heparin gtt  Indication: chest pain/ACS  Allergies  Allergen Reactions  . Clindamycin/Lincomycin Rash  . Doxycycline Other (See Comments)    Makes heart race  . Keflex [Cephalexin] Nausea And Vomiting  . Adhesive [Tape] Rash  . Latex Rash  . Sulfonamide Derivatives Nausea And Vomiting    Patient Measurements: Height: 5\' 5"  (165.1 cm) Weight: 160 lb (72.6 kg) IBW/kg (Calculated) : 57 Heparin Dosing Weight: HEPARIN DW (KG): 71.6   Vital Signs: Temp: 98.1 F (36.7 C) (07/11 1250) Temp Source: Oral (07/11 1250) BP: 138/73 (07/11 1500) Pulse Rate: 52 (07/11 1500)  Labs: Recent Labs    01/07/19 0639 01/09/19 1413  HGB 13.3 13.0  HCT 39.0 38.7  PLT  --  177  CREATININE 0.60 0.60    Estimated Creatinine Clearance: 60.6 mL/min (by C-G formula based on SCr of 0.6 mg/dL).   Medical History: Past Medical History:  Diagnosis Date  . Anticoagulant long-term use    eliquis  . Chronic sinusitis   . Essential hypertension, benign   . GERD (gastroesophageal reflux disease)   . Hemorrhoids   . History of colitis 10/2016   campylobacter  . Hyperlipidemia   . Multinodular goiter    per pt has had 2 biopsy's both benign  . Osteoarthritis    "all over"  and AC joint left shoulder  . Paroxysmal atrial fibrillation [facility]) EP cardiologist--  [name]   s/p PVI by [name] 07/16/2013;   pt first dx 2008,  recurrence 2014 afib/flutter and tachy-brady  . Rotator cuff tear, left   . Shoulder impingement, left   . Status post placement of implantable loop recorder    original placement 07-26-2014;  removal / replacement 10-17-2017  by dr allred  . Type 2 diabetes mellitus ([place])    followed by pcp    Medications:  (Not in a hospital admission)  Scheduled:  . aspirin  324 mg Oral Once  . heparin  4,000 Units Intravenous Once  . potassium chloride SA  40 mEq Oral Once  . sodium chloride flush  3 mL  Intravenous Once   Infusions:  . heparin    . potassium chloride     PRN:  Anti-infectives (From admission, onward)   None      Assessment: Erika Lee a 73 y.o. female requires anticoagulation with a heparin iv infusion for the indication of  chest pain/ACS. Heparin gtt will be started following pharmacy protocol per pharmacy consult. Patient is on previous oral anticoagulant that will require aPTT/HL correlation before transitioning to only HL monitoring.   Goal of Therapy:  Heparin level 0.3-0.7 units/ml aPTT 66-102 seconds Monitor platelets by anticoagulation protocol: Yes   Plan:  Give 4000 units bolus x 1 Start heparin infusion at 850 units/hr Check anti-Xa level in 8 hours and daily while on heparin Continue to monitor H&H and platelets  Heparin level to be drawn in 8 hours for patients >80 years old or crcl < 45ml/min  Donna Christen  01/09/2019,3:39 PM

## 2019-01-09 NOTE — ED Notes (Signed)
Attempted to contact pt's husband without success.

## 2019-01-09 NOTE — ED Notes (Signed)
Date and time results received: 01/09/19 4:57 PM  (use smartphrase ".now" to insert current time)  Test: Troponin Critical Value: 434  Name of Provider Notified: Regenia Skeeter  Orders Received? Or Actions Taken?: Orders Received - See Orders for details

## 2019-01-09 NOTE — H&P (Addendum)
Cardiology Admission History and Physical:   Patient ID: DAAIYAH BAUMERT MRN: [id number]; DOB: October 06, 1945   Admission date: 01/09/2019  Primary Care Provider: Sinda Du, MD Primary Cardiologist: Thompson Grayer, MD   Primary Electrophysiologist: Thompson Grayer, MD    Chief Complaint:  Chest pain   Patient Profile:   Erika Lee is a 73 y.o. female with a history of Afib/flutter with prior PVI, HTN, HLD, DM2, recent shoulder surgery who presented to [facility] with chest pain and is diagnosed with NSTEMI.   History of Present Illness:   LOUSIE Lee is a 73 y.o. female with a history of Afib/flutter with prior PVI, HTN, HLD, DM2, recent shoulder surgery who presented to [facility] with chest pain and is diagnosed with NSTEMI.   The patient has AF with prior PVI and follows in clinic with Dr. Rayann Heman. She was last evaluated for a virtual visit in June 2020 at which time she was doing well without any cardiac complaints; her loop recorder showed no recurrent AF.   She underwent surgical repair of L rotator cuff on 7/9, which she tolerated well.  She presented to the [facility] ED today with chest pain. In the ED, SBP 130-150s, HR upper 40s-50s. ECG showed sinus bradycardia with new TWI in I, AVL, VI, V2; Q waves present in 1 and AVL (although this was seen on prior ECGs). Troponin was 375, which rose to 434 on repeat check. K 3.1 COVID negative. CTA showed no PE. She was given ASA and started on a heparin gtt and transferred to [facility] for further care.  On arrival to [facility], the patient is resting in NAD. ECG shows sinus rhythm with TWI in AVL, AVR, V1-V3. She reports that after her breakfast this morning she developed pain below her left shoulder (different than her operative pain) that radiated down into her chest. She had some associated dyspnea without other symptoms. Her pain improved without intervention. She states that she has had some recurrent chest pain en route from [facility] and still has mild pain on her left side now. Of note, she describes this pain to be more lateral in her side than her pain earlier today.     Heart Pathway Score:     Past Medical History:  Diagnosis Date   Anticoagulant long-term use    eliquis   Chronic sinusitis    Essential hypertension, benign    GERD (gastroesophageal reflux disease)    Hemorrhoids    History of colitis 10/2016   campylobacter   Hyperlipidemia    Multinodular goiter    per pt has had 2 biopsy's both benign   Osteoarthritis    "all over"  and [facility] joint left shoulder   Paroxysmal atrial fibrillation [facility]) EP cardiologist--  [name]   s/p PVI by [name] 07/16/2013;   pt first dx 2008,  recurrence 2014 afib/flutter and tachy-brady   Rotator cuff tear, left    Shoulder impingement, left    Status post placement of implantable loop recorder    original placement 07-26-2014;  removal / replacement 10-17-2017  by dr allred   Type 2 diabetes mellitus ([place])    followed by pcp    Past Surgical History:  Procedure Laterality Date   ATRIAL FIBRILLATION ABLATION N/A 07/16/2013   PVI and CTI ablation by [name]   COLONOSCOPY  09/10/2011   Dr. Arnoldo Morale: normal, 10 year follow up.   LESION REMOVAL N/A 05/03/2013   Procedure:  EXCISION CYST, BACK;  Surgeon: Jamesetta So, MD;  Location: [facility] ORS;  Service: General;  Laterality: N/A;   LOOP RECORDER IMPLANT N/A 07/26/2014   Procedure: LOOP RECORDER IMPLANT;  Surgeon: Thompson Grayer, MD;  Location: [facility] CATH LAB;  Service: Cardiovascular;  Laterality: N/A;   LOOP RECORDER INSERTION N/A 10/17/2017   MDT previously implanted ILR for afib management at [facility].  old device removed and new device placed by [name]   LOOP RECORDER REMOVAL N/A 10/17/2017   MDT ILR removed with new device subsequently replaced   TEE WITHOUT CARDIOVERSION N/A 07/15/2013   Procedure: TRANSESOPHAGEAL ECHOCARDIOGRAM (TEE);  Surgeon: Lelon Perla, MD;  Location: [facility] ENDOSCOPY;   Service: Cardiovascular;  Laterality: N/A;   TONSILLECTOMY  age 58   TRANSANAL HEMORRHOIDAL DEARTERIALIZATION N/A 09/22/2015   Procedure: TRANSANAL HEMORRHOIDAL LIGATION/PEXY EUA POSSIBLE HEMORRHOIDECTOMY ;  Surgeon: Michael Boston, MD;  Location: [facility] ORS;  Service: General;  Laterality: N/A;     Medications Prior to Admission: Prior to Admission medications   Medication Sig Start Date End Date Taking? Authorizing Provider  apixaban (ELIQUIS) 5 MG TABS tablet Take 1 tablet (5 mg total) by mouth 2 (two) times daily. Patient taking differently: Take 5 mg by mouth 2 (two) times daily.  09/12/17  Yes Allred, Jeneen Rinks, MD  esomeprazole (NEXIUM) 20 MG capsule Take 20 mg by mouth daily.    Yes [provider]  hydrochlorothiazide 25 MG tablet Take 25 mg by mouth daily.    Yes [provider]  HYDROcodone-acetaminophen (NORCO) 7.5-325 MG tablet Take 1 tablet by mouth every 6 (six) hours as needed for moderate pain.   Yes [provider]  lisinopril (PRINIVIL,ZESTRIL) 20 MG tablet TAKE 1 TABLET BY MOUTH DAILY Patient taking differently: Take 20 mg by mouth daily.  06/01/18  Yes Allred, Jeneen Rinks, MD  metFORMIN (GLUCOPHAGE) 500 MG tablet Take 1 tablet (500 mg total) by mouth daily. Patient taking differently: Take 500 mg by mouth daily.  07/17/13  Yes Brett Canales, PA-C  metoprolol succinate (TOPROL-XL) 25 MG 24 hr tablet Take 0.5 tablets (12.5 mg total) by mouth daily. Patient taking differently: Take 12.5 mg by mouth daily.  01/21/18  Yes Allred, Jeneen Rinks, MD  ondansetron (ZOFRAN ODT) 4 MG disintegrating tablet Take 1 tablet (4 mg total) by mouth every 8 (eight) hours as needed for nausea or vomiting. 01/07/19  Yes Nicholes Stairs, MD  oxyCODONE (ROXICODONE) 5 MG immediate release tablet Take 1 tablet (5 mg total) by mouth every 6 (six) hours as needed for moderate pain. 01/07/19 01/07/20 Yes Nicholes Stairs, MD  Potassium Gluconate 550 MG TABS Take 550 mg by mouth at bedtime.    Yes  [provider]  promethazine (PHENERGAN) 12.5 MG tablet Take 12.5 mg by mouth 2 (two) times a day.   Yes [provider]  simvastatin (ZOCOR) 40 MG tablet Take 40 mg by mouth at bedtime.    Yes [provider]     Allergies:    Allergies  Allergen Reactions   Clindamycin/Lincomycin Rash   Doxycycline Other (See Comments)    Makes heart race   Keflex [Cephalexin] Nausea And Vomiting   Adhesive [Tape] Rash   Latex Rash   Sulfonamide Derivatives Nausea And Vomiting    Social History:   Social History   Socioeconomic History   Marital status: Married    Spouse name: Not on file   Number of children: Not on file   Years of education: Not on  file   Highest education level: Not on file  Occupational History   Occupation: Pensions consultant: RETIRED    Comment: Full time  Scientist, product/process development strain: Not on file   Food insecurity    Worry: Not on file    Inability: Not on file   Transportation needs    Medical: Not on file    Non-medical: Not on file  Tobacco Use   Smoking status: Former Smoker    Years: 34.00    Types: Cigarettes    Quit date: 01/02/1994    Years since quitting: 25.0   Smokeless tobacco: Never Used  Substance and Sexual Activity   Alcohol use: No   Drug use: No   Sexual activity: Not on file  Lifestyle   Physical activity    Days per week: Not on file    Minutes per session: Not on file   Stress: Not on file  Relationships   Social connections    Talks on phone: Not on file    Gets together: Not on file    Attends religious service: Not on file    Active member of club or organization: Not on file    Attends meetings of clubs or organizations: Not on file    Relationship status: Not on file   Intimate partner violence    Fear of current or ex partner: Not on file    Emotionally abused: Not on file    Physically abused: Not on file    Forced sexual activity: Not on file    Other Topics Concern   Not on file  Social History Narrative   Lives with spouse in [place]    Family History:    The patient's family history includes Kidney disease in her maternal grandfather; Other in her maternal grandfather; Stroke in her maternal grandmother; Stroke (age of onset: 17) in her mother. There is no history of Colon cancer.    ROS:  Please see the history of present illness.  All other ROS reviewed and negative.     Physical Exam/Data:   Vitals:   01/09/19 1700 01/09/19 1722 01/09/19 2004 01/09/19 2245  BP: (!) 155/83  (!) 149/82 (!) 146/82  Pulse: (!) 57  (!) 55   Resp: 17  11   Temp:  98.1 F (36.7 C)    TempSrc:  Oral    SpO2: 97%  94%   Weight:      Height:       No intake or output data in the 24 hours ending 01/09/19 2253 Last 3 Weights 01/09/2019 01/07/2019 01/06/2019  Weight (lbs) 160 lb 164 lb 4.8 oz 160 lb  Weight (kg) 72.576 kg 74.526 kg 72.576 kg     Body mass index is 26.63 kg/m.  General:  Well nourished, well developed, in no acute distress  HEENT: normal Neck: no JVD Cardiac:  normal S1, S2; slow and regular with no murmur  Lungs:  clear to auscultation bilaterally, no wheezing, rhonchi or rales  Abd: soft, nontender  Ext: no LE edema Musculoskeletal:  L shoulder in sling. No deformities Skin: warm and dry  Neuro: No focal abnormalities noted Psych:  Normal affect    EKG:  The ECG that was done and was personally reviewed and demonstrates sinus bradycardia with new TWI I, AVL, VI, V2; Q waves present in 1 and AVL  Relevant CV Studies: 07/15/17: TTE Study Conclusions - Left ventricle: Systolic function was normal. The  estimated ejection fraction was in the range of 55% to60%. Wall motion was normal; there were no regional wall motion abnormalities. - Aortic valve: No evidence of vegetation. - Mitral valve: No evidence of vegetation. Mildregurgitation. - Left atrium: The atrium was mildly dilated. No evidence ofthrombus in the  atrial cavity or appendage. - Atrial septum: No defect or patent foramen ovale wasidentified. There was redundancy of the septum, with borderline criteria for aneurysm. - Tricuspid valve: No evidence of vegetation.  Stress echo 2014: No ischemia  SPECT 2010: Negative stress nuclear myocardial study revealing somewhat impaired exercise capacity, no significant stress-induced EKG abnormalities, normal left ventricular size, normal left ventricular systolic function and normal myocardial perfusion. Other findings as noted.  Laboratory Data:  High Sensitivity Troponin:   Recent Labs  Lab 01/09/19 1413 01/09/19 1607  TROPONINIHS 375.00* 434.00*      Cardiac EnzymesNo results for input(s): TROPONINI in the last 168 hours. No results for input(s): TROPIPOC in the last 168 hours.  Chemistry Recent Labs  Lab 01/07/19 0639 01/09/19 1413  NA 137 134*  K 3.5 3.1*  CL 99 99  CO2  --  26  GLUCOSE 134* 163*  BUN 7* 8  CREATININE 0.60 0.60  CALCIUM  --  9.1  GFRNONAA  --  >60  GFRAA  --  >60  ANIONGAP  --  9    No results for input(s): PROT, ALBUMIN, AST, ALT, ALKPHOS, BILITOT in the last 168 hours. Hematology Recent Labs  Lab 01/07/19 0639 01/09/19 1413  WBC  --  9.4  RBC  --  4.38  HGB 13.3 13.0  HCT 39.0 38.7  MCV  --  88.4  MCH  --  29.7  MCHC  --  33.6  RDW  --  11.9  PLT  --  177   BNPNo results for input(s): BNP, PROBNP in the last 168 hours.  DDimer No results for input(s): DDIMER in the last 168 hours.   Radiology/Studies:  Ct Angio Chest Pe W And/or Wo Contrast  Result Date: 01/09/2019 CLINICAL DATA:  Pain in center of chest 1 hour ago. Coughing after shoulder surgery 2 days ago. EXAM: CT ANGIOGRAPHY CHEST WITH CONTRAST TECHNIQUE: Multidetector CT imaging of the chest was performed using the standard protocol during bolus administration of intravenous contrast. Multiplanar CT image reconstructions and MIPs were obtained to evaluate the vascular anatomy.  CONTRAST:  <See Chart> OMNIPAQUE IOHEXOL 350 MG/ML SOLN COMPARISON:  Chest x-ray January 09, 2019 and chest CT July 26, 2013 FINDINGS: Cardiovascular: The heart size is borderline to mildly enlarged. Mild atherosclerotic changes are seen in the nonaneurysmal aorta. No evidence of dissection. No pulmonary emboli identified. Mediastinum/Nodes: There is a mixed attenuation mass in the right thyroid lobe measuring up to 2.8 cm with peripheral calcifications more prominent when compared to 2015. Esophagus is normal. No adenopathy. Lungs/Pleura: Central airways are normal. No pneumothorax. There is a 5 mm nodule in the posterior left base on series 6, image 90, not seen on the 2015 comparison. A nodule in the anterolateral right lung on series 6, image 154 is probably stable since 2015 taking into account difference in slice selection. No other suspicious nodules. No masses. Scattered atelectasis. No suspicious infiltrates. Upper Abdomen: Possible subtle stone or sludge in the gallbladder. Musculoskeletal: No chest wall abnormality. No acute or significant osseous findings. Review of the MIP images confirms the above findings. IMPRESSION: 1. No pulmonary emboli identified. 2. Mixed attenuation nodule measuring up to 2.8 cm in the  thyroid. If this nodule has not been previously evaluated, recommend ultrasound. 3. 5 mm nodule in the posterior left lung base on series 6, image 90, not seen in 2015. Stable nodule in the anterior right lung on series 6, image 154. No follow-up needed if patient is low-risk. Non-contrast chest CT can be considered in 12 months if patient is high-risk. This recommendation follows the consensus statement: Guidelines for Management of Incidental Pulmonary Nodules Detected on CT Images: From the Fleischner Society 2017; Radiology 2017; 284:228-243. 4. Possible stone or sludge in the gallbladder. 5. Atherosclerotic changes in the thoracic aorta. Aortic Atherosclerosis (ICD10-I70.0). Electronically  Signed   By: Dorise Bullion III M.D   On: 01/09/2019 17:30   Dg Chest Port 1 View  Result Date: 01/09/2019 CLINICAL DATA:  Chest pain today. EXAM: PORTABLE CHEST 1 VIEW COMPARISON:  PA and lateral chest 02/21/2016. FINDINGS: There is cardiomegaly and vascular congestion. No consolidative process, pneumothorax or effusion. Aortic atherosclerosis noted. No acute or focal bony abnormality. IMPRESSION: Cardiomegaly and pulmonary vascular congestion. Electronically Signed   By: Inge Rise M.D.   On: 01/09/2019 15:12    Assessment and Plan:   NSTEMI The patient has no known history of CAD. She presents with a one day history of chest pain that occurred without trigger this morning. ECG shows new T wave changes. Troponin is elevated and rising. Her presentation is consistent with ACS. She continues to have intermittent, mild chest pain (note- although her presentation is consistent with NSTEMI, it seems that she has an additional MSK pain component that may affect clinical picture (per nurse her current pain is positional and improved with heating pad). She has been given ASA and started on heparin. She will likely require LHC, which was discussed with her tonight. -Giving additional dose of NTG and IV morphine now; can add nitropaste or nitro gtt as needed. Let pt and nurse know that goal is to reach chest pain of 0/10.  -Continue heparin gtt -Continue ASA daily -Changing home simvastatin to atorvastatin -Continue home metoprolol -Continue to trend troponin -HgA1c, lipid panel ordered -Echocardiogram ordered  -Anticipate LHC on Monday 7/13 or sooner if chest pain does not improve  AF Currently in NSR. No recurrence of AF by recent interrogation of ILR -Holding apixaban while on heparin gtt -Continue home metoprolol  OA s/p recent shoulder surgery Pt reports that she has been doing well since surgery. -Reports that home oxycodone does not work for her OA pain- will try tramadol as  adjunct  HTN BP moderately elevated on arrival to [facility] -Continue home metoprolol, lisinopril -Holding home HCTZ  DM2 -Hold home metformin -SSI ordered  HLD -Lipid panel ordered -Changing simvastatin to atorvastatin as above  Thyroid goiter Pt with prior benign thyroid nodule. CTA today showed 2.8 cm nodule in thyroid. It is unclear if this is a new or different nodule. -Recommend outpatient follow up   Severity of Illness: The appropriate patient status for this patient is INPATIENT. Inpatient status is judged to be reasonable and necessary in order to provide the required intensity of service to ensure the patient's safety. The patient's presenting symptoms, physical exam findings, and initial radiographic and laboratory data in the context of their chronic comorbidities is felt to place them at high risk for further clinical deterioration. Furthermore, it is not anticipated that the patient will be medically stable for discharge from the hospital within 2 midnights of admission. The following factors support the patient status of inpatient.   "  The patient's presenting symptoms include chest pain. " The worrisome physical exam findings include n/a. " The initial radiographic and laboratory data are worrisome because of abnormal ECG. " The chronic co-morbidities include DM, AF.   * I certify that at the point of admission it is my clinical judgment that the patient will require inpatient hospital care spanning beyond 2 midnights from the point of admission due to high intensity of service, high risk for further deterioration and high frequency of surveillance required.*    For questions or updates, please contact [facility] Please consult [email or web address] for contact info under        Signed, Nila Nephew, MD  01/09/2019 10:53 PM

## 2019-01-09 NOTE — ED Notes (Signed)
Date and time results received: 01/09/19  1456 (use smartphrase ".now" to insert current time)  Test: Troponin Critical Value: 375 Name of Provider Notified: Sigmund Hazel PA Orders Received? Or Actions Taken?: Repeat EKG

## 2019-01-09 NOTE — ED Provider Notes (Addendum)
[facility] EMERGENCY DEPARTMENT Provider Note   CSN: [id number] Arrival date & time: 01/09/19  1237     History   Chief Complaint Chief Complaint  Patient presents with  . Chest Pain    HPI Erika Lee is a 73 y.o. female.     The history is provided by the patient. No language interpreter was used.  Chest Pain Pain location:  L chest Pain quality: aching   Pain radiates to:  Does not radiate Pain severity:  Severe Onset quality:  Sudden Duration:  1 hour Timing:  Constant Progression:  Resolved Relieved by:  Nothing Worsened by:  Nothing Ineffective treatments:  None tried Associated symptoms: no vomiting and no weakness   Risk factors: diabetes mellitus and hypertension   Pt complains of left sided chest pain.  Pt has a history of afib.  Pt reports she had shoulder surgery yesterday.  Pt reports she stopped her eliquis   Past Medical History:  Diagnosis Date  . Anticoagulant long-term use    eliquis  . Chronic sinusitis   . Essential hypertension, benign   . GERD (gastroesophageal reflux disease)   . Hemorrhoids   . History of colitis 10/2016   campylobacter  . Hyperlipidemia   . Multinodular goiter    per pt has had 2 biopsy's both benign  . Osteoarthritis    "all over"  and AC joint left shoulder  . Paroxysmal atrial fibrillation [facility]) EP cardiologist--  [name]   s/p PVI by [name] 07/16/2013;   pt first dx 2008,  recurrence 2014 afib/flutter and tachy-brady  . Rotator cuff tear, left   . Shoulder impingement, left   . Status post placement of implantable loop recorder    original placement 07-26-2014;  removal / replacement 10-17-2017  by dr allred  . Type 2 diabetes mellitus ([place])    followed by pcp    Patient Active Problem List   Diagnosis Date Noted  . Localized osteoarthritis of left shoulder 01/07/2019  . Dilation of biliary tract 02/20/2017  . Common bile duct dilatation 02/20/2017  . Nausea with vomiting 02/20/2017  .  Campylobacter enteritis 02/20/2017  . Hypotension (arterial) 11/21/2016  . Volume depletion 11/21/2016  . Diabetes mellitus 11/21/2016  . Hyperlipidemia 11/21/2016  . A-fib ([place]) 11/21/2016  . Diarrhea 11/21/2016  . Colitis 11/21/2016  . Acute kidney injury ([place]) 11/21/2016  . Hypokalemia 11/21/2016  . Shortness of breath 07/25/2014  . Influenza due to identified novel influenza A virus with other respiratory manifestations 08/03/2013  . Generalized weakness 07/29/2013  . Cough 07/29/2013  . Hyponatremia 07/28/2013  . Atrial fibrillation ([place]) 07/16/2013  . Atrial flutter ([place]) 07/07/2013  . Sinus bradycardia 06/11/2013  . Tachycardia-bradycardia ([place]) 06/11/2013  . Type 2 diabetes mellitus ([place]) 05/13/2013  . Paroxysmal atrial fibrillation ([place]) 09/03/2010  . Essential hypertension, benign 03/10/2007  . GERD 03/10/2007    Past Surgical History:  Procedure Laterality Date  . ATRIAL FIBRILLATION ABLATION N/A 07/16/2013   PVI and CTI ablation by [name]  . COLONOSCOPY  09/10/2011   Dr. Arnoldo Morale: normal, 10 year follow up.  . LESION REMOVAL N/A 05/03/2013   Procedure: EXCISION CYST, BACK;  Surgeon: Jamesetta So, MD;  Location: [facility];  Service: General;  Laterality: N/A;  . LOOP RECORDER IMPLANT N/A 07/26/2014   Procedure: LOOP RECORDER IMPLANT;  Surgeon: Thompson Grayer, MD;  Location: [facility] CATH LAB;  Service: Cardiovascular;  Laterality: N/A;  . LOOP RECORDER INSERTION N/A 10/17/2017   MDT  previously implanted ILR for afib management at [facility].  old device removed and new device placed by [name]  . LOOP RECORDER REMOVAL N/A 10/17/2017   MDT ILR removed with new device subsequently replaced  . TEE WITHOUT CARDIOVERSION N/A 07/15/2013   Procedure: TRANSESOPHAGEAL ECHOCARDIOGRAM (TEE);  Surgeon: Lelon Perla, MD;  Location: [facility] ENDOSCOPY;  Service: Cardiovascular;  Laterality: N/A;  . TONSILLECTOMY  age 13  . TRANSANAL HEMORRHOIDAL DEARTERIALIZATION N/A 09/22/2015   Procedure:  TRANSANAL HEMORRHOIDAL LIGATION/PEXY EUA POSSIBLE HEMORRHOIDECTOMY ;  Surgeon: Michael Boston, MD;  Location: [facility]S;  Service: General;  Laterality: N/A;     OB History   No obstetric history on file.      Home Medications    Prior to Admission medications   Medication Sig Start Date End Date Taking? Authorizing Provider  apixaban (ELIQUIS) 5 MG TABS tablet Take 1 tablet (5 mg total) by mouth 2 (two) times daily. Patient taking differently: Take 5 mg by mouth 2 (two) times daily.  09/12/17  Yes Allred, Jeneen Rinks, MD  esomeprazole (NEXIUM) 20 MG capsule Take 20 mg by mouth daily.    Yes [provider]  hydrochlorothiazide 25 MG tablet Take 25 mg by mouth daily.    Yes [provider]  HYDROcodone-acetaminophen (NORCO) 7.5-325 MG tablet Take 1 tablet by mouth every 6 (six) hours as needed for moderate pain.   Yes [provider]  lisinopril (PRINIVIL,ZESTRIL) 20 MG tablet TAKE 1 TABLET BY MOUTH DAILY Patient taking differently: Take 20 mg by mouth daily.  06/01/18  Yes Allred, Jeneen Rinks, MD  metFORMIN (GLUCOPHAGE) 500 MG tablet Take 1 tablet (500 mg total) by mouth daily. Patient taking differently: Take 500 mg by mouth daily.  07/17/13  Yes Brett Canales, PA-C  metoprolol succinate (TOPROL-XL) 25 MG 24 hr tablet Take 0.5 tablets (12.5 mg total) by mouth daily. Patient taking differently: Take 12.5 mg by mouth daily.  01/21/18  Yes Allred, Jeneen Rinks, MD  ondansetron (ZOFRAN ODT) 4 MG disintegrating tablet Take 1 tablet (4 mg total) by mouth every 8 (eight) hours as needed for nausea or vomiting. 01/07/19  Yes Nicholes Stairs, MD  oxyCODONE (ROXICODONE) 5 MG immediate release tablet Take 1 tablet (5 mg total) by mouth every 6 (six) hours as needed for moderate pain. 01/07/19 01/07/20 Yes Nicholes Stairs, MD  Potassium Gluconate 550 MG TABS Take 550 mg by mouth at bedtime.    Yes [provider]  promethazine (PHENERGAN) 12.5 MG tablet Take 12.5 mg by mouth 2 (two)  times a day.   Yes [provider]  simvastatin (ZOCOR) 40 MG tablet Take 40 mg by mouth at bedtime.    Yes [provider]    Family History Family History  Problem Relation Age of Onset  . Stroke Mother 18  . Stroke Maternal Grandmother        stroke  . Other Maternal Grandfather        "heart dropsy" kidney issues  . Kidney disease Maternal Grandfather   . Colon cancer Neg Hx     Social History Social History   Tobacco Use  . Smoking status: Former Smoker    Years: 34.00    Types: Cigarettes    Quit date: 01/02/1994    Years since quitting: 25.0  . Smokeless tobacco: Never Used  Substance Use Topics  . Alcohol use: No  . Drug use: No     Allergies   Clindamycin/lincomycin, Doxycycline, Keflex [cephalexin], Adhesive [tape], Latex,  and Sulfonamide derivatives   Review of Systems Review of Systems  Cardiovascular: Positive for chest pain.  Gastrointestinal: Negative for vomiting.  Neurological: Negative for weakness.  All other systems reviewed and are negative.    Physical Exam Updated Vital Signs BP (!) 182/78 (BP Location: Right Arm)   Pulse (!) 55   Temp 98.1 F (36.7 C) (Oral)   Resp 19   Ht 5\' 5"  (1.651 m)   Wt 72.6 kg   LMP  (LMP Unknown)   SpO2 97%   BMI 26.63 kg/m   Physical Exam Vitals signs and nursing note reviewed.  Constitutional:      Appearance: She is well-developed.  HENT:     Head: Normocephalic.  Neck:     Musculoskeletal: Normal range of motion.  Cardiovascular:     Rate and Rhythm: Normal rate and regular rhythm.     Heart sounds: Normal heart sounds.  Pulmonary:     Effort: Pulmonary effort is normal.     Breath sounds: Normal breath sounds.  Abdominal:     General: There is no distension.     Palpations: Abdomen is soft.  Musculoskeletal: Normal range of motion.  Skin:    General: Skin is warm.  Neurological:     Mental Status: She is alert and oriented to person, place, and time.  Psychiatric:         Mood and Affect: Mood normal.      ED Treatments / Results  Labs (all labs ordered are listed, but only abnormal results are displayed) Labs Reviewed  BASIC METABOLIC PANEL - Abnormal; Notable for the following components:      Result Value   Sodium 134 (*)    Potassium 3.1 (*)    Glucose, Bld 163 (*)    All other components within normal limits  TROPONIN I (HIGH SENSITIVITY) - Abnormal; Notable for the following components:   Troponin I (High Sensitivity) 375.00 (*)    All other components within normal limits  CBC    EKG EKG Interpretation  Date/Time:  Saturday January 09 2019 12:45:44 EDT Ventricular Rate:  60 PR Interval:  178 QRS Duration: 98 QT Interval:  480 QTC Calculation: 480 R Axis:   90 Text Interpretation:  Sinus rhythm with Premature atrial complexes Septal infarct , age undetermined Possible Lateral infarct , age undetermined Abnormal ECG has changed Confirmed by Fredia Sorrow [phone]) on 01/09/2019 1:55:25 PM   Radiology No results found.  Procedures .Critical Care Performed by: Fransico Meadow, PA-C Authorized by: Fransico Meadow, PA-C   Critical care provider statement:    Critical care time (minutes):  45   Critical care start time:  01/09/2019 1:00 AM   Critical care end time:  01/09/2019 3:40 PM   Critical care time was exclusive of:  Separately billable procedures and treating other patients   Critical care was necessary to treat or prevent imminent or life-threatening deterioration of the following conditions:  Cardiac failure and circulatory failure   Critical care was time spent personally by me on the following activities:  Discussions with consultants, evaluation of patient's response to treatment, examination of patient, ordering and performing treatments and interventions, ordering and review of laboratory studies, ordering and review of radiographic studies, pulse oximetry, re-evaluation of patient's condition, obtaining history from  patient or surrogate, review of old charts, blood draw for specimens, development of treatment plan with patient or surrogate and interpretation of cardiac output measurements   (including critical care time)  Medications  Ordered in ED Medications  sodium chloride flush (NS) 0.9 % injection 3 mL (3 mLs Intravenous Not Given 01/09/19 1446)     Initial Impression / Assessment and Plan / ED Course  I have reviewed the triage vital signs and the nursing notes.  Pertinent labs & imaging results that were available during my care of the patient were reviewed by me and considered in my medical decision making (see chart for details).        MDM   Ekg changes since preop EKg yesterday.   Pt has troponin of 375.  Pt is currently pain free. I spoke to Dr. Margaretann Loveless cardiology on call.  She advised Heparin, ASA,  Pt to be admitted to Cardiology at [facility]   Final Clinical Impressions(s) / ED Diagnoses   Final diagnoses:  NSTEMI (non-ST elevated myocardial infarction) [facility])    ED Discharge Orders    None       Sidney Ace 01/09/19 1509    Fransico Meadow, [place] 01/09/19 1541    Fredia Sorrow, MD 01/09/19 1558    Fredia Sorrow, MD 01/09/19 1616

## 2019-01-09 NOTE — ED Triage Notes (Addendum)
Pain in center of chest x 1 hour ago.  Started after eating.  Reports coughing a lot since her shoulder surgery 2 days.

## 2019-01-10 ENCOUNTER — Inpatient Hospital Stay (HOSPITAL_COMMUNITY): Payer: PPO

## 2019-01-10 ENCOUNTER — Other Ambulatory Visit: Payer: Self-pay

## 2019-01-10 DIAGNOSIS — R9431 Abnormal electrocardiogram [ECG] [EKG]: Secondary | ICD-10-CM

## 2019-01-10 LAB — GLUCOSE, CAPILLARY
Glucose-Capillary: 105 mg/dL — ABNORMAL HIGH (ref 70–99)
Glucose-Capillary: 119 mg/dL — ABNORMAL HIGH (ref 70–99)
Glucose-Capillary: 149 mg/dL — ABNORMAL HIGH (ref 70–99)
Glucose-Capillary: 123 mg/dL — ABNORMAL HIGH (ref 70–99)
Glucose-Capillary: 128 mg/dL — ABNORMAL HIGH (ref 70–99)

## 2019-01-10 LAB — LIPID PANEL
Cholesterol: 117 mg/dL (ref 0–200)
HDL: 53 mg/dL (ref >40)
LDL Cholesterol: 48 mg/dL (ref 0–99)
Total CHOL/HDL Ratio: 2.2 RATIO
Triglycerides: 79 mg/dL (ref <150)
VLDL: 16 mg/dL (ref 0–40)

## 2019-01-10 LAB — BASIC METABOLIC PANEL
Anion gap: 10 (ref 5–15)
BUN: <5 mg/dL — ABNORMAL LOW (ref 8–23)
CO2: 25 mmol/L (ref 22–32)
Calcium: 9.7 mg/dL (ref 8.9–10.3)
Chloride: 102 mmol/L (ref 98–111)
Creatinine, Ser: 0.69 mg/dL (ref 0.44–1.00)
GFR calc Af Amer: >60 mL/min (ref >60)
GFR calc non Af Amer: >60 mL/min (ref >60)
Glucose, Bld: 130 mg/dL — ABNORMAL HIGH (ref 70–99)
Potassium: 3.7 mmol/L (ref 3.5–5.1)
Sodium: 137 mmol/L (ref 135–145)

## 2019-01-10 LAB — ECHOCARDIOGRAM COMPLETE
Height: 65 in
Weight: 2641.99 oz

## 2019-01-10 LAB — APTT
aPTT: 53 seconds — ABNORMAL HIGH (ref 24–36)
aPTT: 61 seconds — ABNORMAL HIGH (ref 24–36)
aPTT: 59 seconds — ABNORMAL HIGH (ref 24–36)

## 2019-01-10 LAB — TROPONIN I (HIGH SENSITIVITY): Troponin I (High Sensitivity): 294 ng/L (ref <18)

## 2019-01-10 LAB — MRSA PCR SCREENING: MRSA by PCR: POSITIVE — AB

## 2019-01-10 LAB — HEPARIN LEVEL (UNFRACTIONATED): Heparin Unfractionated: >2.2 IU/mL — ABNORMAL HIGH (ref 0.30–0.70)

## 2019-01-10 MED ORDER — CHLORHEXIDINE GLUCONATE CLOTH 2 % EX PADS
6.0000 | MEDICATED_PAD | Freq: Every day | CUTANEOUS | Status: DC
  Administered 2019-01-11: 06:00:00 6 via TOPICAL

## 2019-01-10 MED ORDER — SODIUM CHLORIDE 0.9 % IV SOLN: 250.0000 mL | INTRAVENOUS | Status: DC | PRN

## 2019-01-10 MED ORDER — SODIUM CHLORIDE 0.9 % IV SOLN
INTRAVENOUS | Status: DC
  Administered 2019-01-11: 05:00:00 via INTRAVENOUS

## 2019-01-10 MED ORDER — SODIUM CHLORIDE 0.9% FLUSH: 3.0000 mL | INTRAVENOUS | Status: DC | PRN

## 2019-01-10 MED ORDER — PERFLUTREN LIPID MICROSPHERE
1.0000 mL | INTRAVENOUS | Status: AC | PRN
  Administered 2019-01-10: 2 mL via INTRAVENOUS
  Filled 2019-01-10: qty 10

## 2019-01-10 MED ORDER — TRAMADOL HCL 50 MG PO TABS
50.0000 mg | ORAL_TABLET | Freq: Four times a day (QID) | ORAL | Status: DC | PRN
  Administered 2019-01-10 – 2019-01-11 (×5): 50 mg via ORAL
  Filled 2019-01-10 (×5): qty 1

## 2019-01-10 MED ORDER — MUPIROCIN 2 % EX OINT
1.0000 "application " | TOPICAL_OINTMENT | Freq: Two times a day (BID) | CUTANEOUS | Status: DC
  Administered 2019-01-10 – 2019-01-11 (×3): 1 via NASAL
  Filled 2019-01-10: qty 22

## 2019-01-10 MED ORDER — SODIUM CHLORIDE 0.9% FLUSH
3.0000 mL | Freq: Two times a day (BID) | INTRAVENOUS | Status: DC
  Administered 2019-01-10 (×2): 3 mL via INTRAVENOUS

## 2019-01-10 MED ORDER — ASPIRIN 81 MG PO CHEW
81.0000 mg | CHEWABLE_TABLET | ORAL | Status: AC
  Administered 2019-01-11: 81 mg via ORAL
  Filled 2019-01-10: qty 1

## 2019-01-10 MED ORDER — PANTOPRAZOLE SODIUM 40 MG PO TBEC
40.0000 mg | DELAYED_RELEASE_TABLET | Freq: Every day | ORAL | Status: DC
  Administered 2019-01-10 – 2019-01-11 (×2): 40 mg via ORAL
  Filled 2019-01-10 (×2): qty 1

## 2019-01-10 NOTE — Progress Notes (Addendum)
Paged MD about pain medicine d/t lower back pain that radiates down bilateral knees. Also c/o pain to left shoulder (recently had surgery). Tramodol ordered but patient does not have relief. Takes hydrocodone 7.5mg  at home. Requesting for same pain med as taken at home. Awaiting MD call.   No call back, patient given tramodol and tylenol prn pain.

## 2019-01-10 NOTE — Progress Notes (Signed)
Progress Note  Patient Name: Erika Lee Date of Encounter: 01/10/2019  Primary Cardiologist: Thompson Grayer, MD   Subjective   Complaining of vague left upper chest and shoulder pain this morning although she is postop day 3 left shoulder surgery.  She is on IV heparin.  Inpatient Medications    Scheduled Meds:  aspirin  81 mg Oral Daily   atorvastatin  40 mg Oral q1800   Chlorhexidine Gluconate Cloth  6 each Topical Q0600   insulin aspart  0-15 Units Subcutaneous TID WC   insulin aspart  0-5 Units Subcutaneous QHS   lisinopril  20 mg Oral Daily   metoprolol succinate  12.5 mg Oral Daily   mupirocin ointment  1 application Nasal BID   sodium chloride flush  3 mL Intravenous Once   Continuous Infusions:  heparin 1,050 Units/hr (01/10/19 0830)   PRN Meds: acetaminophen, nitroGLYCERIN, ondansetron (ZOFRAN) IV, traMADol   Vital Signs    Vitals:   01/10/19 0200 01/10/19 0320 01/10/19 0809 01/10/19 0858  BP: (!) 155/79 (!) 157/79 (!) 178/82 (!) 159/77  Pulse: (!) 49 (!) 49  66  Resp: 17 14  19   Temp:  98.1 F (36.7 C) 98 F (36.7 C)   TempSrc:  Oral Oral   SpO2: 95% 94%  96%  Weight:      Height:        Intake/Output Summary (Last 24 hours) at 01/10/2019 0900 Last data filed at 01/10/2019 0809 Gross per 24 hour  Intake 843.85 ml  Output 950 ml  Net -106.15 ml   Last 3 Weights 01/09/2019 01/09/2019 01/07/2019  Weight (lbs) 165 lb 2 oz 160 lb 164 lb 4.8 oz  Weight (kg) 74.9 kg 72.576 kg 74.526 kg      Telemetry    Normal sinus rhythm- Personally Reviewed  ECG    None today- Personally Reviewed  Physical Exam   GEN: No acute distress.   Neck: No JVD Cardiac: RRR, no murmurs, rubs, or gallops.  Respiratory: Clear to auscultation bilaterally. GI: Soft, nontender, non-distended  MS: No edema; No deformity. Neuro:  Nonfocal  Psych: Normal affect   Labs    High Sensitivity Troponin:   Recent Labs  Lab 01/09/19 1413 01/09/19 1607  01/10/19 0308  TROPONINIHS 375.00* 434.00* 294*      Cardiac EnzymesNo results for input(s): TROPONINI in the last 168 hours. No results for input(s): TROPIPOC in the last 168 hours.   Chemistry Recent Labs  Lab 01/07/19 0639 01/09/19 1413 01/10/19 0657  NA 137 134* 137  K 3.5 3.1* 3.7  CL 99 99 102  CO2  --  26 25  GLUCOSE 134* 163* 130*  BUN 7* 8 <5*  CREATININE 0.60 0.60 0.69  CALCIUM  --  9.1 9.7  GFRNONAA  --  >60 >60  GFRAA  --  >60 >60  ANIONGAP  --  9 10     Hematology Recent Labs  Lab 01/07/19 0639 01/09/19 1413  WBC  --  9.4  RBC  --  4.38  HGB 13.3 13.0  HCT 39.0 38.7  MCV  --  88.4  MCH  --  29.7  MCHC  --  33.6  RDW  --  11.9  PLT  --  177    BNPNo results for input(s): BNP, PROBNP in the last 168 hours.   DDimer No results for input(s): DDIMER in the last 168 hours.   Radiology    Ct Angio Chest Pe W And/or Wo  Contrast  Result Date: 01/09/2019 CLINICAL DATA:  Pain in center of chest 1 hour ago. Coughing after shoulder surgery 2 days ago. EXAM: CT ANGIOGRAPHY CHEST WITH CONTRAST TECHNIQUE: Multidetector CT imaging of the chest was performed using the standard protocol during bolus administration of intravenous contrast. Multiplanar CT image reconstructions and MIPs were obtained to evaluate the vascular anatomy. CONTRAST:  <See Chart> OMNIPAQUE IOHEXOL 350 MG/ML SOLN COMPARISON:  Chest x-ray January 09, 2019 and chest CT July 26, 2013 FINDINGS: Cardiovascular: The heart size is borderline to mildly enlarged. Mild atherosclerotic changes are seen in the nonaneurysmal aorta. No evidence of dissection. No pulmonary emboli identified. Mediastinum/Nodes: There is a mixed attenuation mass in the right thyroid lobe measuring up to 2.8 cm with peripheral calcifications more prominent when compared to 2015. Esophagus is normal. No adenopathy. Lungs/Pleura: Central airways are normal. No pneumothorax. There is a 5 mm nodule in the posterior left base on series 6,  image 90, not seen on the 2015 comparison. A nodule in the anterolateral right lung on series 6, image 154 is probably stable since 2015 taking into account difference in slice selection. No other suspicious nodules. No masses. Scattered atelectasis. No suspicious infiltrates. Upper Abdomen: Possible subtle stone or sludge in the gallbladder. Musculoskeletal: No chest wall abnormality. No acute or significant osseous findings. Review of the MIP images confirms the above findings. IMPRESSION: 1. No pulmonary emboli identified. 2. Mixed attenuation nodule measuring up to 2.8 cm in the thyroid. If this nodule has not been previously evaluated, recommend ultrasound. 3. 5 mm nodule in the posterior left lung base on series 6, image 90, not seen in 2015. Stable nodule in the anterior right lung on series 6, image 154. No follow-up needed if patient is low-risk. Non-contrast chest CT can be considered in 12 months if patient is high-risk. This recommendation follows the consensus statement: Guidelines for Management of Incidental Pulmonary Nodules Detected on CT Images: From the Fleischner Society 2017; Radiology 2017; 284:228-243. 4. Possible stone or sludge in the gallbladder. 5. Atherosclerotic changes in the thoracic aorta. Aortic Atherosclerosis (ICD10-I70.0). Electronically Signed   By: Dorise Bullion III M.D   On: 01/09/2019 17:30   Dg Chest Port 1 View  Result Date: 01/09/2019 CLINICAL DATA:  Chest pain today. EXAM: PORTABLE CHEST 1 VIEW COMPARISON:  PA and lateral chest 02/21/2016. FINDINGS: There is cardiomegaly and vascular congestion. No consolidative process, pneumothorax or effusion. Aortic atherosclerosis noted. No acute or focal bony abnormality. IMPRESSION: Cardiomegaly and pulmonary vascular congestion. Electronically Signed   By: Inge Rise M.D.   On: 01/09/2019 15:12    Cardiac Studies   None  Patient Profile     THEKLA Lee is a 73 y.o. female with a history of Afib/flutter with  prior PVI, HTN, HLD, DM2, recent shoulder surgery who presented to [facility] with chest pain and is diagnosed with NSTEMI.   Her EKG showed no acute changes although there were some anterior T wave inversions similar to prior EKGs.  Her troponin rose to 400 and came down to 300.  She was on Eliquis oral anticoagulation which has been held and is currently on IV heparin.  Assessment & Plan    1: Non-STEMI- admitted with chest pain.  Troponins rose from 300-400 and came back down to 300.  She does have new anterior and lateral T wave inversion.  Her symptoms have improved.  Eliquis has been held for anticipated cardiac catheterization tomorrow.  She is on IV heparin. The  patient understands that risks included but are not limited to stroke (1 in 1000), death (1 in 39), kidney failure [usually temporary] (1 in 500), bleeding (1 in 200), allergic reaction [possibly serious] (1 in 200). The patient understands and agrees to proceed.  Given her need to be on apixaban, if she needs intervention, she will need to be treated with Plavix.  2: Atrial fibrillation- history of A. fib ablation by Dr. Rayann Heman currently in sinus rhythm.  She does have an implantable loop recorder in place and was on metoprolol at home which we will continue.  Apixaban is on hold  3: Essential hypertension- continue metoprolol, lisinopril, hold hydrochlorothiazide  4: Type 2 diabetes- hold metformin, SSI  5: Hyperlipidemia- change simvastatin to atorvastatin  For questions or updates, please contact [facility] Please consult [email or web address] for contact info under        Signed, Quay Burow, MD  01/10/2019, 9:00 AM

## 2019-01-10 NOTE — Progress Notes (Signed)
[place] for heparin gtt  Indication: chest pain/ACS  Allergies  Allergen Reactions  . Clindamycin/Lincomycin Rash  . Doxycycline Other (See Comments)    Makes heart race  . Keflex [Cephalexin] Nausea And Vomiting  . Adhesive [Tape] Rash  . Latex Rash  . Sulfonamide Derivatives Nausea And Vomiting    Patient Measurements: Height: 5\' 5"  (165.1 cm) Weight: 165 lb 2 oz (74.9 kg) IBW/kg (Calculated) : 57 Heparin Dosing Weight: HEPARIN DW (KG): 72.3   Vital Signs: Temp: 98.3 F (36.8 C) (07/11 2303) Temp Source: Oral (07/11 2303) BP: 141/77 (07/11 2303) Pulse Rate: 50 (07/11 2303)  Labs: Recent Labs    01/07/19 0639 01/09/19 1413 01/09/19 2246  HGB 13.3 13.0  --   HCT 39.0 38.7  --   PLT  --  177  --   APTT  --   --  53*  HEPARINUNFRC  --   --  >2.20*  CREATININE 0.60 0.60  --     Estimated Creatinine Clearance: 61.6 mL/min (by C-G formula based on SCr of 0.6 mg/dL).   Assessment: 73 yo lady started on heparin at [facility] for CP.  She was on eliquis PTA Initial heparin level >2.2, aPTT 53 sec Goal: Heparin level 0.3-0.7 units/ml aPTT 66-102 seconds Monitor platelets by anticoagulation protocol: Yes   Plan:  Increase heparin drip to 950 units/hr.  Check aPTT 6 hours after rate change  Thanks for allowing pharmacy to be a part of this patient's care.  Excell Seltzer, PharmD Clinical Pharmacist 01/10/2019,1:01 AM

## 2019-01-10 NOTE — Progress Notes (Signed)
Md made aware of critical trop 294. Pt sleeping. Erika Lee

## 2019-01-10 NOTE — Progress Notes (Signed)
[place] for heparin  Indication: chest pain/ACS  Allergies  Allergen Reactions  . Clindamycin/Lincomycin Rash  . Doxycycline Other (See Comments)    Makes heart race  . Keflex [Cephalexin] Nausea And Vomiting  . Adhesive [Tape] Rash  . Latex Rash  . Sulfonamide Derivatives Nausea And Vomiting    Patient Measurements: Height: 5\' 5"  (165.1 cm) Weight: 165 lb 2 oz (74.9 kg) IBW/kg (Calculated) : 57 Heparin Dosing Weight: 72.3   Vital Signs: Temp: 98 F (36.7 C) (07/12 0809) Temp Source: Oral (07/12 0809) BP: 157/79 (07/12 0320) Pulse Rate: 49 (07/12 0320)  Labs: Recent Labs    01/09/19 1413 01/09/19 2246 01/10/19 0657  HGB 13.0  --   --   HCT 38.7  --   --   PLT 177  --   --   APTT  --  53* 61*  HEPARINUNFRC  --  >2.20*  --   CREATININE 0.60  --  0.69    Estimated Creatinine Clearance: 61.6 mL/min (by C-G formula based on SCr of 0.69 mg/dL).   Assessment: 73 yo F started on IV heparin at [facility] for chest pain. She was on Eliquis 5 mg BID PTA for afib. Last dose taken 7/11 ~ 0930.  Initial heparin level falsely elevated at > 2.2 due to recent Eliquis dose. Dosing heparin based on aPTT until levels correlate.   APTT remains subtherapeutic at 61 sec despite rate increase overnight. Initial CBC wnl.  Goal: Heparin level 0.3-0.7 units/ml aPTT 66-102 seconds Monitor platelets by anticoagulation protocol: Yes   Plan:  Increase heparin drip to 1050 units/hr. Check aPTT 8 hours after rate change Daily aPTT and heparin levels until levels correlate Daily CBC  Thanks for allowing pharmacy to be a part of this patient's care.  Vertis Kelch, PharmD PGY2 Cardiology Pharmacy Resident Phone [phone] 01/10/2019       8:17 AM  Please check [email or web address] for unit-specific pharmacist phone numbers

## 2019-01-10 NOTE — Plan of Care (Signed)
Pt is progressing to meet plan goals

## 2019-01-10 NOTE — Progress Notes (Signed)
  Echocardiogram 2D Echocardiogram has been performed.  Erika Lee 01/10/2019, 12:38 PM

## 2019-01-10 NOTE — Progress Notes (Addendum)
[place] for heparin  Indication: chest pain/ACS  Allergies  Allergen Reactions  . Clindamycin/Lincomycin Rash  . Doxycycline Other (See Comments)    Makes heart race  . Keflex [Cephalexin] Nausea And Vomiting  . Adhesive [Tape] Rash  . Latex Rash  . Sulfonamide Derivatives Nausea And Vomiting    Patient Measurements: Height: 5\' 5"  (165.1 cm) Weight: 165 lb 2 oz (74.9 kg) IBW/kg (Calculated) : 57 Heparin Dosing Weight: 72.3   Vital Signs: Temp: 98.5 F (36.9 C) (07/12 1458) Temp Source: Oral (07/12 1458) BP: 174/95 (07/12 1500) Pulse Rate: 63 (07/12 1500)  Labs: Recent Labs    01/09/19 1413 01/09/19 2246 01/10/19 0657 01/10/19 1641  HGB 13.0  --   --   --   HCT 38.7  --   --   --   PLT 177  --   --   --   APTT  --  53* 61* 59*  HEPARINUNFRC  --  >2.20*  --   --   CREATININE 0.60  --  0.69  --     Estimated Creatinine Clearance: 61.6 mL/min (by C-G formula based on SCr of 0.69 mg/dL).   Assessment: 73 yo F started on IV heparin at [facility] for chest pain. She was on Eliquis 5 mg BID PTA for afib. Last dose taken 7/11 ~ 0930.  Given recent Eliquis, dosing heparin based on aPTT until levels correlate.   APTT remains subtherapeutic at 59 sec despite rate increase to 1050 units/hr this AM. RN noted bleeding at the line that has since resolved. Will continue to monitor for signs of bleeding.  Goal: Heparin level 0.3-0.7 units/ml aPTT 66-102 seconds Monitor platelets by anticoagulation protocol: Yes   Plan:  Increase heparin drip to 1200 units/hr. Daily aPTT and heparin levels until levels correlate Daily CBC  Richardine Service, PharmD PGY1 Pharmacy Resident Direct Line: [phone]  01/10/2019       6:23 PM  Please check [email or web address] for unit-specific pharmacist phone numbers

## 2019-01-11 ENCOUNTER — Encounter (HOSPITAL_COMMUNITY)
Admission: EM | Disposition: A | Discharge status: Home / Self Care | Payer: Self-pay | Source: Home / Self Care | Attending: Internal Medicine

## 2019-01-11 ENCOUNTER — Encounter (HOSPITAL_BASED_OUTPATIENT_CLINIC_OR_DEPARTMENT_OTHER): Payer: Self-pay | Admitting: Orthopedic Surgery

## 2019-01-11 DIAGNOSIS — I214 Non-ST elevation (NSTEMI) myocardial infarction: Secondary | ICD-10-CM

## 2019-01-11 DIAGNOSIS — I4891 Unspecified atrial fibrillation: Secondary | ICD-10-CM

## 2019-01-11 HISTORY — PX: LEFT HEART CATH AND CORONARY ANGIOGRAPHY: CATH118249

## 2019-01-11 LAB — CBC
HCT: 39.4 % (ref 36.0–46.0)
Hemoglobin: 13.6 g/dL (ref 12.0–15.0)
MCH: 29.7 pg (ref 26.0–34.0)
MCHC: 34.5 g/dL (ref 30.0–36.0)
MCV: 86 fL (ref 80.0–100.0)
Platelets: 211 10*3/uL (ref 150–400)
RBC: 4.58 MIL/uL (ref 3.87–5.11)
RDW: 11.8 % (ref 11.5–15.5)
WBC: 6.8 10*3/uL (ref 4.0–10.5)
nRBC: 0 % (ref 0.0–0.2)

## 2019-01-11 LAB — APTT: aPTT: 89 seconds — ABNORMAL HIGH (ref 24–36)

## 2019-01-11 LAB — GLUCOSE, CAPILLARY
Glucose-Capillary: 137 mg/dL — ABNORMAL HIGH (ref 70–99)
Glucose-Capillary: 119 mg/dL — ABNORMAL HIGH (ref 70–99)

## 2019-01-11 LAB — HEPARIN LEVEL (UNFRACTIONATED): Heparin Unfractionated: 0.78 IU/mL — ABNORMAL HIGH (ref 0.30–0.70)

## 2019-01-11 LAB — HEMOGLOBIN A1C
Hgb A1c MFr Bld: 6.5 % — ABNORMAL HIGH (ref 4.8–5.6)
Mean Plasma Glucose: 140 mg/dL

## 2019-01-11 SURGERY — LEFT HEART CATH AND CORONARY ANGIOGRAPHY
Anesthesia: LOCAL

## 2019-01-11 MED ORDER — LIDOCAINE HCL (PF) 1 % IJ SOLN
INTRAMUSCULAR | Status: AC
  Filled 2019-01-11: qty 30

## 2019-01-11 MED ORDER — HYDROCODONE-ACETAMINOPHEN 7.5-325 MG PO TABS
1.0000 | ORAL_TABLET | Freq: Once | ORAL | Status: AC
  Administered 2019-01-11: 15:00:00 1 via ORAL
  Filled 2019-01-11: qty 1

## 2019-01-11 MED ORDER — VERAPAMIL HCL 2.5 MG/ML IV SOLN
INTRA_ARTERIAL | Status: DC | PRN
  Administered 2019-01-11: 5 mL via INTRA_ARTERIAL

## 2019-01-11 MED ORDER — FENTANYL CITRATE (PF) 100 MCG/2ML IJ SOLN
INTRAMUSCULAR | Status: AC
  Filled 2019-01-11: qty 2

## 2019-01-11 MED ORDER — NITROGLYCERIN 1 MG/10 ML FOR IR/CATH LAB
INTRA_ARTERIAL | Status: AC
  Filled 2019-01-11: qty 10

## 2019-01-11 MED ORDER — LIDOCAINE HCL (PF) 1 % IJ SOLN
INTRAMUSCULAR | Status: DC | PRN
  Administered 2019-01-11: 2 mL via SUBCUTANEOUS

## 2019-01-11 MED ORDER — MIDAZOLAM HCL 2 MG/2ML IJ SOLN
INTRAMUSCULAR | Status: DC | PRN
  Administered 2019-01-11: 1 mg via INTRAVENOUS

## 2019-01-11 MED ORDER — MIDAZOLAM HCL 2 MG/2ML IJ SOLN
INTRAMUSCULAR | Status: AC
  Filled 2019-01-11: qty 2

## 2019-01-11 MED ORDER — HEPARIN SODIUM (PORCINE) 1000 UNIT/ML IJ SOLN
INTRAMUSCULAR | Status: DC | PRN
  Administered 2019-01-11: 3500 [IU] via INTRAVENOUS

## 2019-01-11 MED ORDER — FENTANYL CITRATE (PF) 100 MCG/2ML IJ SOLN
INTRAMUSCULAR | Status: DC | PRN
  Administered 2019-01-11: 25 ug via INTRAVENOUS

## 2019-01-11 MED ORDER — VERAPAMIL HCL 2.5 MG/ML IV SOLN
INTRAVENOUS | Status: AC
  Filled 2019-01-11: qty 2

## 2019-01-11 MED ORDER — IOHEXOL 350 MG/ML SOLN
INTRAVENOUS | Status: DC | PRN
  Administered 2019-01-11: 55 mL via INTRA_ARTERIAL

## 2019-01-11 MED ORDER — HEPARIN (PORCINE) IN NACL 1000-0.9 UT/500ML-% IV SOLN
INTRAVENOUS | Status: DC | PRN
  Administered 2019-01-11 (×2): 500 mL

## 2019-01-11 MED ORDER — HEPARIN (PORCINE) IN NACL 1000-0.9 UT/500ML-% IV SOLN
INTRAVENOUS | Status: AC
  Filled 2019-01-11: qty 1000

## 2019-01-11 MED ORDER — HEPARIN SODIUM (PORCINE) 1000 UNIT/ML IJ SOLN
INTRAMUSCULAR | Status: AC
  Filled 2019-01-11: qty 1

## 2019-01-11 SURGICAL SUPPLY — 13 items
BAG SNAP BAND KOVER 36X36 (MISCELLANEOUS) ×2 IMPLANT
CATH INFINITI 5FR ANG PIGTAIL (CATHETERS) ×2 IMPLANT
CATH OPTITORQUE TIG 4.0 5F (CATHETERS) ×2 IMPLANT
DEVICE RAD COMP TR BAND LRG (VASCULAR PRODUCTS) ×2 IMPLANT
GLIDESHEATH SLEND A-KIT 6F 22G (SHEATH) ×2 IMPLANT
GUIDEWIRE INQWIRE 1.5J.035X260 (WIRE) ×1 IMPLANT
INQWIRE 1.5J .035X260CM (WIRE) ×2
KIT HEART LEFT (KITS) ×2 IMPLANT
PACK CARDIAC CATHETERIZATION (CUSTOM PROCEDURE TRAY) ×2 IMPLANT
SYR MEDRAD MARK 7 150ML (SYRINGE) ×2 IMPLANT
TRANSDUCER W/STOPCOCK (MISCELLANEOUS) ×2 IMPLANT
TUBING CIL FLEX 10 FLL-RA (TUBING) ×2 IMPLANT
WIRE HI TORQ VERSACORE-J 145CM (WIRE) ×2 IMPLANT

## 2019-01-11 NOTE — Progress Notes (Signed)
[id number] Reviewed how to take NTG if needed. Discussed MI restrictions, heart healthy and low carb diet choices and gave diets, walking instruction for ex and CRP 2. Referred to [facility] program. Pt not interested in CRP 2 but would like APP for virtual ex program. Pt voiced understanding of ed.  Pt is interested in participating in Virtual Cardiac Rehab. Pt advised that Virtual Cardiac Rehab is provided at no cost to the patient.  Graylon Good RN BSN 01/11/2019 3:10 PM    Checklist:  1. Pt has smart device  ie smartphone and/or ipad for downloading an app  Yes 2. Reliable internet/wifi service    Yes 3. Understands how to use their smartphone and navigate within an app.  Yes   Reviewed with pt the scheduling process for virtual cardiac rehab.  Pt verbalized understanding.

## 2019-01-11 NOTE — Interval H&P Note (Signed)
Cath Lab Visit (complete for each Cath Lab visit)  Clinical Evaluation Leading to the Procedure:   ACS: Yes.    Non-ACS:    Anginal Classification: CCS III  Anti-ischemic medical therapy: Minimal Therapy (1 class of medications)  Non-Invasive Test Results: No non-invasive testing performed  Prior CABG: No previous CABG      History and Physical Interval Note:  01/11/2019 10:41 AM  Erika Lee  has presented today for surgery, with the diagnosis of NSTEMI.  The various methods of treatment have been discussed with the patient and family. After consideration of risks, benefits and other options for treatment, the patient has consented to  Procedure(s): LEFT HEART CATH AND CORONARY ANGIOGRAPHY (N/A) as a surgical intervention.  The patient's history has been reviewed, patient examined, no change in status, stable for surgery.  I have reviewed the patient's chart and labs.  Questions were answered to the patient's satisfaction.     Quay Burow

## 2019-01-11 NOTE — Progress Notes (Signed)
DC instructions given to patient. Educated on medication regimen (hold metformin until 7/15), new follow up appointments, s/s for MI and radial site restrictions. Pt educated on driving restrictions. VSS. PIV DC, hemostasis achieved. R radial site, hemostasis achieved, level 0, dsg CDI. Quarter size bruising (soft) noted at previous wrist PIV site. All belongings sent home with patient at time of DC.   Delayed DC d/t initial oozing at TR band, slow progression.   Pt escorted by NT via wheelchair to private vehicle driven by family.

## 2019-01-11 NOTE — Progress Notes (Signed)
[place] for heparin  Indication: chest pain/ACS  Allergies  Allergen Reactions  . Clindamycin/Lincomycin Rash  . Doxycycline Other (See Comments)    Makes heart race  . Keflex [Cephalexin] Nausea And Vomiting  . Adhesive [Tape] Rash  . Latex Rash  . Sulfonamide Derivatives Nausea And Vomiting    Patient Measurements: Height: 5\' 5"  (165.1 cm) Weight: 165 lb 2 oz (74.9 kg) IBW/kg (Calculated) : 57 Heparin Dosing Weight: 72.3   Vital Signs: Temp: 98.5 F (36.9 C) (07/12 2351) Temp Source: Axillary (07/12 2351) BP: 178/100 (07/12 2351) Pulse Rate: 59 (07/12 2351)  Labs: Recent Labs    01/09/19 1413  01/09/19 2246 01/10/19 0657 01/10/19 1641 01/11/19 0032  HGB 13.0  --   --   --   --  13.6  HCT 38.7  --   --   --   --  39.4  PLT 177  --   --   --   --  211  APTT  --    < > 53* 61* 59* 89*  HEPARINUNFRC  --   --  >2.20*  --   --  0.78*  CREATININE 0.60  --   --  0.69  --   --    < > = values in this interval not displayed.    Estimated Creatinine Clearance: 61.6 mL/min (by C-G formula based on SCr of 0.69 mg/dL).   Assessment: 73 yo F started on IV heparin at [facility] for chest pain. She was on Eliquis 5 mg BID PTA for afib. Last dose taken 7/11 ~ 0930.  Given recent Eliquis, dosing heparin based on aPTT until levels correlate.   APTT 89 sec  Goal: Heparin level 0.3-0.7 units/ml aPTT 66-102 seconds Monitor platelets by anticoagulation protocol: Yes   Plan:  Continue heparin drip at 1200 units/hr. Daily aPTT and heparin levels until levels correlate Daily CBC  Thanks for allowing pharmacy to be a part of this patient's care.  Excell Seltzer, PharmD Clinical Pharmacist

## 2019-01-11 NOTE — Discharge Summary (Signed)
Discharge Summary    Patient ID: Erika Lee MRN: [id number]; DOB: 1946-03-30  Admit date: 01/09/2019 Discharge date: 01/11/2019  Primary Care Provider: Sinda Du, MD  Primary Cardiologist: Thompson Grayer, MD  Primary Electrophysiologist:  None   Discharge Diagnoses    Active Problems:   Chest pain   NSTEMI (non-ST elevated myocardial infarction) [facility])   Allergies Allergies  Allergen Reactions   Clindamycin/Lincomycin Rash   Doxycycline Other (See Comments)    Makes heart race   Keflex [Cephalexin] Nausea And Vomiting   Adhesive [Tape] Rash   Latex Rash   Sulfonamide Derivatives Nausea And Vomiting    Diagnostic Studies/Procedures    Left heart cath 01/11/2019 IMPRESSION: Ms. Beal has normal coronary arteries and normal LV function.  The etiology of her chest pain, elevated troponins, EKG changes and wall motion and imagine 2D echo are still unclear.  She has normal LV function by ventriculography.  She may have had coronary spasm.  The sheath was removed and a TR band was placed on the right wrist to achieve patent hemostasis.  The patient left lab in stable condition.  She can be discharged home later today on her home dose of Eliquis oral anticoagulation.  The left ventricular size is normal. The left ventricular systolic function is normal. LV end diastolic pressure is normal. The left ventricular ejection fraction is greater than 65% by visual estimate. No regional wall motion abnormalities. _____________  Echocardiogram 01/10/2019 IMPRESSIONS   1. Severe akinesis of the left ventricular, mid-apical anteroseptal wall.  2. The left ventricle has low normal systolic function, with an ejection fraction of 50-55%. The cavity size was normal. There is moderately increased left ventricular wall thickness. Left ventricular diastolic Doppler parameters are consistent with  impaired relaxation.  3. The right ventricle has normal systolic function. The cavity was  normal. There is no increase in right ventricular wall thickness.   History of Present Illness     Erika Lee is a 73 y.o. female with a history of Afib/flutter with prior PVI, HTN, HLD, DM2, recent shoulder surgery who presented to [facility] with chest pain and is diagnosed with NSTEMI.   The patient has AF with prior PVI and follows in clinic with Dr. Rayann Heman. She was last evaluated for a virtual visit in June 2020 at which time she was doing well without any cardiac complaints; her loop recorder showed no recurrent AF.   She underwent surgical repair of L rotator cuff on 7/9, which she tolerated well.  She presented to the [facility] ED today with chest pain. In the ED, SBP 130-150s, HR upper 40s-50s. ECG showed sinus bradycardia with new TWI in I, AVL, VI, V2; Q waves present in 1 and AVL (although this was seen on prior ECGs). Troponin was 375, which rose to 434 on repeat check. K 3.1 COVID negative. CTA showed no PE. She was given ASA and started on a heparin gtt and transferred to [facility] for further care. Eliquis held.   Hospital Course     Consultants: None  Echo showed low normal LV systolic function, EF 08-65%, with severe akinesis mid-apical anteroseptal LV wall. Ms. Tarnow was taken for cardiac cath today that revealed normal coronary arteries, normal EF 65%, and normal LV end diastolic pressure.  The etiology of her chest pain, elevated troponins, EKG changes and wall motion and imagine 2D echo are still unclear.  She has normal LV function by ventriculography.  She may have had coronary spasm.  Patient has remained in sinus rhythm. She will continue on betablocker and resume apixaban at discharge.   Blood pressure has been elevated, improved today. Continue ACE-I and BB. Resume HCTZ at discharge. Reassess at follow up.   Chest CT showed 5 mm nodule in the posterior left lung base on series 6, image 90, not seen in 2015. Stable nodule in the anterior right lung  on series 6, image 154. No follow-up needed if patient is low-risk. Non-contrast chest CT can be considered in 12 months if patient is high-risk. She is a former 34 year smoker, quit in 1995.   Chest CT also showed Mixed attenuation nodule measuring up to 2.8 cm in the thyroid. If this nodule has not been previously evaluated, recommend Ultrasound. She had thyroid US in 03/2016 with multiple nodules. Benign follicular nodule by needle biopsy in 05/2016. Follow up with PCP.   Patient has been seen by Dr. Stanford Breed today and deemed ready for discharge home. All follow up appointments have been scheduled. Discharge medications are listed below.  _____________  Discharge Vitals Blood pressure (!) 172/76, pulse (!) 54, temperature 97.7 F (36.5 C), temperature source Oral, resp. rate 17, height 5\' 5"  (1.651 m), weight 72.4 kg, SpO2 97 %.  Filed Weights   01/09/19 1251 01/09/19 2303 01/11/19 0512  Weight: 72.6 kg 74.9 kg 72.4 kg    Labs & Radiologic Studies    CBC Recent Labs    01/09/19 1413 01/11/19 0032  WBC 9.4 6.8  HGB 13.0 13.6  HCT 38.7 39.4  MCV 88.4 86.0  PLT 177 967   Basic Metabolic Panel Recent Labs    01/09/19 1413 01/10/19 0657  NA 134* 137  K 3.1* 3.7  CL 99 102  CO2 26 25  GLUCOSE 163* 130*  BUN 8 <5*  CREATININE 0.60 0.69  CALCIUM 9.1 9.7   Liver Function Tests No results for input(s): AST, ALT, ALKPHOS, BILITOT, PROT, ALBUMIN in the last 72 hours. No results for input(s): LIPASE, AMYLASE in the last 72 hours. Cardiac Enzymes No results for input(s): CKTOTAL, CKMB, CKMBINDEX, TROPONINI in the last 72 hours. BNP Invalid input(s): POCBNP D-Dimer No results for input(s): DDIMER in the last 72 hours. Hemoglobin A1C Recent Labs    01/09/19 1413  HGBA1C 6.5*   Fasting Lipid Panel Recent Labs    01/10/19 0308  CHOL 117  HDL 53  LDLCALC 48  TRIG 79  CHOLHDL 2.2   Thyroid Function Tests No results for input(s): TSH, T4TOTAL, T3FREE, THYROIDAB  in the last 72 hours.  Invalid input(s): FREET3 _____________  Ct Angio Chest Pe W And/or Wo Contrast  Result Date: 01/09/2019 CLINICAL DATA:  Pain in center of chest 1 hour ago. Coughing after shoulder surgery 2 days ago. EXAM: CT ANGIOGRAPHY CHEST WITH CONTRAST TECHNIQUE: Multidetector CT imaging of the chest was performed using the standard protocol during bolus administration of intravenous contrast. Multiplanar CT image reconstructions and MIPs were obtained to evaluate the vascular anatomy. CONTRAST:  <See Chart> OMNIPAQUE IOHEXOL 350 MG/ML SOLN COMPARISON:  Chest x-ray January 09, 2019 and chest CT July 26, 2013 FINDINGS: Cardiovascular: The heart size is borderline to mildly enlarged. Mild atherosclerotic changes are seen in the nonaneurysmal aorta. No evidence of dissection. No pulmonary emboli identified. Mediastinum/Nodes: There is a mixed attenuation mass in the right thyroid lobe measuring up to 2.8 cm with peripheral calcifications more prominent when compared to 2015. Esophagus is normal. No adenopathy. Lungs/Pleura: Central airways are normal. No pneumothorax. There is  a 5 mm nodule in the posterior left base on series 6, image 90, not seen on the 2015 comparison. A nodule in the anterolateral right lung on series 6, image 154 is probably stable since 2015 taking into account difference in slice selection. No other suspicious nodules. No masses. Scattered atelectasis. No suspicious infiltrates. Upper Abdomen: Possible subtle stone or sludge in the gallbladder. Musculoskeletal: No chest wall abnormality. No acute or significant osseous findings. Review of the MIP images confirms the above findings. IMPRESSION: 1. No pulmonary emboli identified. 2. Mixed attenuation nodule measuring up to 2.8 cm in the thyroid. If this nodule has not been previously evaluated, recommend ultrasound. 3. 5 mm nodule in the posterior left lung base on series 6, image 90, not seen in 2015. Stable nodule in the  anterior right lung on series 6, image 154. No follow-up needed if patient is low-risk. Non-contrast chest CT can be considered in 12 months if patient is high-risk. This recommendation follows the consensus statement: Guidelines for Management of Incidental Pulmonary Nodules Detected on CT Images: From the Fleischner Society 2017; Radiology 2017; 284:228-243. 4. Possible stone or sludge in the gallbladder. 5. Atherosclerotic changes in the thoracic aorta. Aortic Atherosclerosis (ICD10-I70.0). Electronically Signed   By: Dorise Bullion III M.D   On: 01/09/2019 17:30   Dg Chest Port 1 View  Result Date: 01/09/2019 CLINICAL DATA:  Chest pain today. EXAM: PORTABLE CHEST 1 VIEW COMPARISON:  PA and lateral chest 02/21/2016. FINDINGS: There is cardiomegaly and vascular congestion. No consolidative process, pneumothorax or effusion. Aortic atherosclerosis noted. No acute or focal bony abnormality. IMPRESSION: Cardiomegaly and pulmonary vascular congestion. Electronically Signed   By: Inge Rise M.D.   On: 01/09/2019 15:12   Disposition   Pt is being discharged home today in good condition.  Follow-up Plans & Appointments    Follow-up Information    Isaiah Serge, NP Follow up.   Specialties: Cardiology, Radiology Why: Cardiology hospital follow up on 01/28/2019 at 10:30. Please arrive 15 minutes early for check in.  Contact information: [address] [place] [address] [phone]          Discharge Instructions    Diet - low sodium heart healthy   Complete by: As directed    Discharge instructions   Complete by: As directed    PLEASE REMEMBER TO BRING ALL OF YOUR MEDICATIONS TO EACH OF YOUR FOLLOW-UP OFFICE VISITS.  PLEASE ATTEND ALL SCHEDULED FOLLOW-UP APPOINTMENTS.   Activity: Increase activity slowly as tolerated. You may shower, but no soaking baths (or swimming) for 1 week. No driving for 24 hours. No lifting over 5 lbs for 1 week. No sexual activity for 1  week.   Wound Care: You may wash cath site gently with soap and water. Keep cath site clean and dry. If you notice pain, swelling, bleeding or pus at your cath site, please call [phone].   Increase activity slowly   Complete by: As directed       Discharge Medications   Allergies as of 01/11/2019      Reactions   Clindamycin/lincomycin Rash   Doxycycline Other (See Comments)   Makes heart race   Keflex [cephalexin] Nausea And Vomiting   Adhesive [tape] Rash   Latex Rash   Sulfonamide Derivatives Nausea And Vomiting      Medication List    TAKE these medications   apixaban 5 MG Tabs tablet Commonly known as: ELIQUIS Take 1 tablet (5 mg total) by mouth  2 (two) times daily.   esomeprazole 20 MG capsule Commonly known as: NEXIUM Take 20 mg by mouth daily.   hydrochlorothiazide 25 MG tablet Commonly known as: HYDRODIURIL Take 25 mg by mouth daily.   HYDROcodone-acetaminophen 7.5-325 MG tablet Commonly known as: NORCO Take 1 tablet by mouth every 6 (six) hours as needed for moderate pain.   lisinopril 20 MG tablet Commonly known as: ZESTRIL TAKE 1 TABLET BY MOUTH DAILY   metFORMIN 500 MG tablet Commonly known as: GLUCOPHAGE Take 1 tablet (500 mg total) by mouth daily. Notes to patient: Can restart 48 hrs after cath, 7/15   metoprolol succinate 25 MG 24 hr tablet Commonly known as: TOPROL-XL Take 0.5 tablets (12.5 mg total) by mouth daily.   ondansetron 4 MG disintegrating tablet Commonly known as: Zofran ODT Take 1 tablet (4 mg total) by mouth every 8 (eight) hours as needed for nausea or vomiting.   oxyCODONE 5 MG immediate release tablet Commonly known as: Roxicodone Take 1 tablet (5 mg total) by mouth every 6 (six) hours as needed for moderate pain.   Potassium Gluconate 550 MG Tabs Take 550 mg by mouth at bedtime.   promethazine 12.5 MG tablet Commonly known as: PHENERGAN Take 12.5 mg by mouth 2 (two) times a day.   simvastatin 40 MG  tablet Commonly known as: ZOCOR Take 40 mg by mouth at bedtime.        Acute coronary syndrome (MI, NSTEMI, STEMI, etc) this admission?:  No.  The elevated Troponin was due to possible coronary vasospasm, no coronary artery stenosis on cath.    Outstanding Labs/Studies   None  Duration of Discharge Encounter   Greater than 30 minutes including physician time.  Signed, Daune Perch, NP 01/11/2019, 1:44 PM

## 2019-01-11 NOTE — Progress Notes (Signed)
Progress Note  Patient Name: Erika Lee Date of Encounter: 01/11/2019  Primary Cardiologist: Thompson Grayer, MD   Subjective   No CP or dyspnea  Inpatient Medications    Scheduled Meds:  aspirin  81 mg Oral Daily   atorvastatin  40 mg Oral q1800   Chlorhexidine Gluconate Cloth  6 each Topical Q0600   insulin aspart  0-15 Units Subcutaneous TID WC   insulin aspart  0-5 Units Subcutaneous QHS   lisinopril  20 mg Oral Daily   metoprolol succinate  12.5 mg Oral Daily   mupirocin ointment  1 application Nasal BID   pantoprazole  40 mg Oral Daily   sodium chloride flush  3 mL Intravenous Once   sodium chloride flush  3 mL Intravenous Q12H   Continuous Infusions:  sodium chloride     sodium chloride 50 mL/hr at 01/11/19 0457   heparin 1,200 Units/hr (01/10/19 1937)   PRN Meds: sodium chloride, acetaminophen, nitroGLYCERIN, ondansetron (ZOFRAN) IV, sodium chloride flush, traMADol   Vital Signs    Vitals:   01/10/19 2110 01/10/19 2351 01/11/19 0401 01/11/19 0512  BP: (!) 168/94 (!) 178/100 119/85   Pulse:  (!) 59 (!) 52   Resp:  11 19   Temp:  98.5 F (36.9 C) 97.8 F (36.6 C)   TempSrc:  Axillary Oral   SpO2:  98% 97%   Weight:    72.4 kg  Height:        Intake/Output Summary (Last 24 hours) at 01/11/2019 0745 Last data filed at 01/11/2019 0400 Gross per 24 hour  Intake 964.75 ml  Output --  Net 964.75 ml   Last 3 Weights 01/11/2019 01/09/2019 01/09/2019  Weight (lbs) 159 lb 9.8 oz 165 lb 2 oz 160 lb  Weight (kg) 72.4 kg 74.9 kg 72.576 kg      Telemetry    Sinus rhythm - Personally Reviewed  Physical Exam   GEN: No acute distress.   Neck: No JVD Cardiac: RRR, no murmurs, rubs, or gallops.  Respiratory: Clear to auscultation bilaterally. GI: Soft, nontender, non-distended  MS: No edema Neuro:  Nonfocal  Psych: Normal affect   Labs    High Sensitivity Troponin:   Recent Labs  Lab 01/09/19 1413 01/09/19 1607 01/10/19 0308   TROPONINIHS 375.00* 434.00* 294*      Chemistry Recent Labs  Lab 01/07/19 0639 01/09/19 1413 01/10/19 0657  NA 137 134* 137  K 3.5 3.1* 3.7  CL 99 99 102  CO2  --  26 25  GLUCOSE 134* 163* 130*  BUN 7* 8 <5*  CREATININE 0.60 0.60 0.69  CALCIUM  --  9.1 9.7  GFRNONAA  --  >60 >60  GFRAA  --  >60 >60  ANIONGAP  --  9 10     Hematology Recent Labs  Lab 01/07/19 0639 01/09/19 1413 01/11/19 0032  WBC  --  9.4 6.8  RBC  --  4.38 4.58  HGB 13.3 13.0 13.6  HCT 39.0 38.7 39.4  MCV  --  88.4 86.0  MCH  --  29.7 29.7  MCHC  --  33.6 34.5  RDW  --  11.9 11.8  PLT  --  177 211    Radiology    Ct Angio Chest Pe W And/or Wo Contrast  Result Date: 01/09/2019 CLINICAL DATA:  Pain in center of chest 1 hour ago. Coughing after shoulder surgery 2 days ago. EXAM: CT ANGIOGRAPHY CHEST WITH CONTRAST TECHNIQUE: Multidetector CT imaging of the  chest was performed using the standard protocol during bolus administration of intravenous contrast. Multiplanar CT image reconstructions and MIPs were obtained to evaluate the vascular anatomy. CONTRAST:  <See Chart> OMNIPAQUE IOHEXOL 350 MG/ML SOLN COMPARISON:  Chest x-ray January 09, 2019 and chest CT July 26, 2013 FINDINGS: Cardiovascular: The heart size is borderline to mildly enlarged. Mild atherosclerotic changes are seen in the nonaneurysmal aorta. No evidence of dissection. No pulmonary emboli identified. Mediastinum/Nodes: There is a mixed attenuation mass in the right thyroid lobe measuring up to 2.8 cm with peripheral calcifications more prominent when compared to 2015. Esophagus is normal. No adenopathy. Lungs/Pleura: Central airways are normal. No pneumothorax. There is a 5 mm nodule in the posterior left base on series 6, image 90, not seen on the 2015 comparison. A nodule in the anterolateral right lung on series 6, image 154 is probably stable since 2015 taking into account difference in slice selection. No other suspicious nodules. No  masses. Scattered atelectasis. No suspicious infiltrates. Upper Abdomen: Possible subtle stone or sludge in the gallbladder. Musculoskeletal: No chest wall abnormality. No acute or significant osseous findings. Review of the MIP images confirms the above findings. IMPRESSION: 1. No pulmonary emboli identified. 2. Mixed attenuation nodule measuring up to 2.8 cm in the thyroid. If this nodule has not been previously evaluated, recommend ultrasound. 3. 5 mm nodule in the posterior left lung base on series 6, image 90, not seen in 2015. Stable nodule in the anterior right lung on series 6, image 154. No follow-up needed if patient is low-risk. Non-contrast chest CT can be considered in 12 months if patient is high-risk. This recommendation follows the consensus statement: Guidelines for Management of Incidental Pulmonary Nodules Detected on CT Images: From the Fleischner Society 2017; Radiology 2017; 284:228-243. 4. Possible stone or sludge in the gallbladder. 5. Atherosclerotic changes in the thoracic aorta. Aortic Atherosclerosis (ICD10-I70.0). Electronically Signed   By: Dorise Bullion III M.D   On: 01/09/2019 17:30   Dg Chest Port 1 View  Result Date: 01/09/2019 CLINICAL DATA:  Chest pain today. EXAM: PORTABLE CHEST 1 VIEW COMPARISON:  PA and lateral chest 02/21/2016. FINDINGS: There is cardiomegaly and vascular congestion. No consolidative process, pneumothorax or effusion. Aortic atherosclerosis noted. No acute or focal bony abnormality. IMPRESSION: Cardiomegaly and pulmonary vascular congestion. Electronically Signed   By: Inge Rise M.D.   On: 01/09/2019 15:12    Patient Profile     Erika Lee a 73 y.o.femalewith a history of Afib/flutter with prior PVI, HTN, HLD,DM2,recent shoulder surgery who presented to [facility] with chest pain and is diagnosed with NSTEMI.  Her EKG showed no acute changes although there were some anterior T wave inversions similar to prior EKGs.  Her troponin  rose to 400 and came down to 300.  She was on Eliquis oral anticoagulation which has been held and is currently on IV heparin.  Assessment & Plan    1 non-ST elevation myocardial infarction-plan is for cardiac catheterization today.  The risks and benefits including myocardial infarction, CVA and death discussed and she agrees to proceed.  Continue aspirin, heparin, beta-blocker and statin.  2 history of atrial fibrillation-patient remains in sinus rhythm.  Continue beta-blocker at present dose.  Resume apixaban at discharge.  3 hypertension-patient's blood pressure had been elevated but improved this morning.  We will continue ACE inhibitor and beta-blocker.  Will resume HCTZ at discharge.  Follow blood pressure and adjust regimen as needed.  4 hyperlipidemia-continue statin.  5 thyroid  nodules/pulmonary nodules-we will need follow-up after discharge.  For questions or updates, please contact [place] Please consult [email or web address] for contact info under        Signed, Kirk Ruths, MD  01/11/2019, 7:45 AM

## 2019-01-11 NOTE — Discharge Instructions (Signed)
Radial Site Care ° °This sheet gives you information about how to care for yourself after your procedure. Your health care provider may also give you more specific instructions. If you have problems or questions, contact your health care provider. °What can I expect after the procedure? °After the procedure, it is common to have: °· Bruising and tenderness at the catheter insertion area. °Follow these instructions at home: °Medicines °· Take over-the-counter and prescription medicines only as told by your health care provider. °Insertion site care °· Follow instructions from your health care provider about how to take care of your insertion site. Make sure you: °? Wash your hands with soap and water before you change your bandage (dressing). If soap and water are not available, use hand sanitizer. °? Change your dressing as told by your health care provider. °? Leave stitches (sutures), skin glue, or adhesive strips in place. These skin closures may need to stay in place for 2 weeks or longer. If adhesive strip edges start to loosen and curl up, you may trim the loose edges. Do not remove adhesive strips completely unless your health care provider tells you to do that. °· Check your insertion site every day for signs of infection. Check for: °? Redness, swelling, or pain. °? Fluid or blood. °? Pus or a bad smell. °? Warmth. °· Do not take baths, swim, or use a hot tub until your health care provider approves. °· You may shower 24-48 hours after the procedure, or as directed by your health care provider. °? Remove the dressing and gently wash the site with plain soap and water. °? Pat the area dry with a clean towel. °? Do not rub the site. That could cause bleeding. °· Do not apply powder or lotion to the site. °Activity ° °· For 24 hours after the procedure, or as directed by your health care provider: °? Do not flex or bend the affected arm. °? Do not push or pull heavy objects with the affected arm. °? Do not  drive yourself home from the hospital or clinic. You may drive 24 hours after the procedure unless your health care provider tells you not to. °? Do not operate machinery or power tools. °· Do not lift anything that is heavier than 10 lb (4.5 kg), or the limit that you are told, until your health care provider says that it is safe. °· Ask your health care provider when it is okay to: °? Return to work or school. °? Resume usual physical activities or sports. °? Resume sexual activity. °General instructions °· If the catheter site starts to bleed, raise your arm and put firm pressure on the site. If the bleeding does not stop, get help right away. This is a medical emergency. °· If you went home on the same day as your procedure, a responsible adult should be with you for the first 24 hours after you arrive home. °· Keep all follow-up visits as told by your health care provider. This is important. °Contact a health care provider if: °· You have a fever. °· You have redness, swelling, or yellow drainage around your insertion site. °Get help right away if: °· You have unusual pain at the radial site. °· The catheter insertion area swells very fast. °· The insertion area is bleeding, and the bleeding does not stop when you hold steady pressure on the area. °· Your arm or hand becomes pale, cool, tingly, or numb. °These symptoms may represent a serious problem   that is an emergency. Do not wait to see if the symptoms will go away. Get medical help right away. Call your local emergency services (911 in the [place] Do not drive yourself to the hospital. °Summary °· After the procedure, it is common to have bruising and tenderness at the site. °· Follow instructions from your health care provider about how to take care of your radial site wound. Check the wound every day for signs of infection. °· Do not lift anything that is heavier than 10 lb (4.5 kg), or the limit that you are told, until your health care provider says  that it is safe. °This information is not intended to replace advice given to you by your health care provider. Make sure you discuss any questions you have with your health care provider. °Document Released: 07/20/2010 Document Revised: 07/23/2017 Document Reviewed: 07/23/2017 °Elsevier Patient Education © 2020 [employer] ° °

## 2019-01-11 NOTE — H&P (View-Only) (Signed)
Progress Note  Patient Name: Erika Lee Date of Encounter: 01/11/2019  Primary Cardiologist: Thompson Grayer, MD   Subjective   No CP or dyspnea  Inpatient Medications    Scheduled Meds:  aspirin  81 mg Oral Daily   atorvastatin  40 mg Oral q1800   Chlorhexidine Gluconate Cloth  6 each Topical Q0600   insulin aspart  0-15 Units Subcutaneous TID WC   insulin aspart  0-5 Units Subcutaneous QHS   lisinopril  20 mg Oral Daily   metoprolol succinate  12.5 mg Oral Daily   mupirocin ointment  1 application Nasal BID   pantoprazole  40 mg Oral Daily   sodium chloride flush  3 mL Intravenous Once   sodium chloride flush  3 mL Intravenous Q12H   Continuous Infusions:  sodium chloride     sodium chloride 50 mL/hr at 01/11/19 0457   heparin 1,200 Units/hr (01/10/19 1937)   PRN Meds: sodium chloride, acetaminophen, nitroGLYCERIN, ondansetron (ZOFRAN) IV, sodium chloride flush, traMADol   Vital Signs    Vitals:   01/10/19 2110 01/10/19 2351 01/11/19 0401 01/11/19 0512  BP: (!) 168/94 (!) 178/100 119/85   Pulse:  (!) 59 (!) 52   Resp:  11 19   Temp:  98.5 F (36.9 C) 97.8 F (36.6 C)   TempSrc:  Axillary Oral   SpO2:  98% 97%   Weight:    72.4 kg  Height:        Intake/Output Summary (Last 24 hours) at 01/11/2019 0745 Last data filed at 01/11/2019 0400 Gross per 24 hour  Intake 964.75 ml  Output --  Net 964.75 ml   Last 3 Weights 01/11/2019 01/09/2019 01/09/2019  Weight (lbs) 159 lb 9.8 oz 165 lb 2 oz 160 lb  Weight (kg) 72.4 kg 74.9 kg 72.576 kg      Telemetry    Sinus rhythm - Personally Reviewed  Physical Exam   GEN: No acute distress.   Neck: No JVD Cardiac: RRR, no murmurs, rubs, or gallops.  Respiratory: Clear to auscultation bilaterally. GI: Soft, nontender, non-distended  MS: No edema Neuro:  Nonfocal  Psych: Normal affect   Labs    High Sensitivity Troponin:   Recent Labs  Lab 01/09/19 1413 01/09/19 1607 01/10/19 0308   TROPONINIHS 375.00* 434.00* 294*      Chemistry Recent Labs  Lab 01/07/19 0639 01/09/19 1413 01/10/19 0657  NA 137 134* 137  K 3.5 3.1* 3.7  CL 99 99 102  CO2  --  26 25  GLUCOSE 134* 163* 130*  BUN 7* 8 <5*  CREATININE 0.60 0.60 0.69  CALCIUM  --  9.1 9.7  GFRNONAA  --  >60 >60  GFRAA  --  >60 >60  ANIONGAP  --  9 10     Hematology Recent Labs  Lab 01/07/19 0639 01/09/19 1413 01/11/19 0032  WBC  --  9.4 6.8  RBC  --  4.38 4.58  HGB 13.3 13.0 13.6  HCT 39.0 38.7 39.4  MCV  --  88.4 86.0  MCH  --  29.7 29.7  MCHC  --  33.6 34.5  RDW  --  11.9 11.8  PLT  --  177 211    Radiology    Ct Angio Chest Pe W And/or Wo Contrast  Result Date: 01/09/2019 CLINICAL DATA:  Pain in center of chest 1 hour ago. Coughing after shoulder surgery 2 days ago. EXAM: CT ANGIOGRAPHY CHEST WITH CONTRAST TECHNIQUE: Multidetector CT imaging of the  chest was performed using the standard protocol during bolus administration of intravenous contrast. Multiplanar CT image reconstructions and MIPs were obtained to evaluate the vascular anatomy. CONTRAST:  <See Chart> OMNIPAQUE IOHEXOL 350 MG/ML SOLN COMPARISON:  Chest x-ray January 09, 2019 and chest CT July 26, 2013 FINDINGS: Cardiovascular: The heart size is borderline to mildly enlarged. Mild atherosclerotic changes are seen in the nonaneurysmal aorta. No evidence of dissection. No pulmonary emboli identified. Mediastinum/Nodes: There is a mixed attenuation mass in the right thyroid lobe measuring up to 2.8 cm with peripheral calcifications more prominent when compared to 2015. Esophagus is normal. No adenopathy. Lungs/Pleura: Central airways are normal. No pneumothorax. There is a 5 mm nodule in the posterior left base on series 6, image 90, not seen on the 2015 comparison. A nodule in the anterolateral right lung on series 6, image 154 is probably stable since 2015 taking into account difference in slice selection. No other suspicious nodules. No  masses. Scattered atelectasis. No suspicious infiltrates. Upper Abdomen: Possible subtle stone or sludge in the gallbladder. Musculoskeletal: No chest wall abnormality. No acute or significant osseous findings. Review of the MIP images confirms the above findings. IMPRESSION: 1. No pulmonary emboli identified. 2. Mixed attenuation nodule measuring up to 2.8 cm in the thyroid. If this nodule has not been previously evaluated, recommend ultrasound. 3. 5 mm nodule in the posterior left lung base on series 6, image 90, not seen in 2015. Stable nodule in the anterior right lung on series 6, image 154. No follow-up needed if patient is low-risk. Non-contrast chest CT can be considered in 12 months if patient is high-risk. This recommendation follows the consensus statement: Guidelines for Management of Incidental Pulmonary Nodules Detected on CT Images: From the Fleischner Society 2017; Radiology 2017; 284:228-243. 4. Possible stone or sludge in the gallbladder. 5. Atherosclerotic changes in the thoracic aorta. Aortic Atherosclerosis (ICD10-I70.0). Electronically Signed   By: Dorise Bullion III M.D   On: 01/09/2019 17:30   Dg Chest Port 1 View  Result Date: 01/09/2019 CLINICAL DATA:  Chest pain today. EXAM: PORTABLE CHEST 1 VIEW COMPARISON:  PA and lateral chest 02/21/2016. FINDINGS: There is cardiomegaly and vascular congestion. No consolidative process, pneumothorax or effusion. Aortic atherosclerosis noted. No acute or focal bony abnormality. IMPRESSION: Cardiomegaly and pulmonary vascular congestion. Electronically Signed   By: Inge Rise M.D.   On: 01/09/2019 15:12    Patient Profile     Erika Lee a 73 y.o.femalewith a history of Afib/flutter with prior PVI, HTN, HLD,DM2,recent shoulder surgery who presented to [facility] with chest pain and is diagnosed with NSTEMI.  Her EKG showed no acute changes although there were some anterior T wave inversions similar to prior EKGs.  Her troponin  rose to 400 and came down to 300.  She was on Eliquis oral anticoagulation which has been held and is currently on IV heparin.  Assessment & Plan    1 non-ST elevation myocardial infarction-plan is for cardiac catheterization today.  The risks and benefits including myocardial infarction, CVA and death discussed and she agrees to proceed.  Continue aspirin, heparin, beta-blocker and statin.  2 history of atrial fibrillation-patient remains in sinus rhythm.  Continue beta-blocker at present dose.  Resume apixaban at discharge.  3 hypertension-patient's blood pressure had been elevated but improved this morning.  We will continue ACE inhibitor and beta-blocker.  Will resume HCTZ at discharge.  Follow blood pressure and adjust regimen as needed.  4 hyperlipidemia-continue statin.  5 thyroid  nodules/pulmonary nodules-we will need follow-up after discharge.  For questions or updates, please contact [place] Please consult [email or web address] for contact info under        Signed, Kirk Ruths, MD  01/11/2019, 7:45 AM

## 2019-01-19 DIAGNOSIS — I10 Essential (primary) hypertension: Secondary | ICD-10-CM | POA: Diagnosis not present

## 2019-01-19 DIAGNOSIS — K210 Gastro-esophageal reflux disease with esophagitis: Secondary | ICD-10-CM | POA: Diagnosis not present

## 2019-01-19 DIAGNOSIS — E119 Type 2 diabetes mellitus without complications: Secondary | ICD-10-CM | POA: Diagnosis not present

## 2019-01-19 DIAGNOSIS — K219 Gastro-esophageal reflux disease without esophagitis: Secondary | ICD-10-CM | POA: Diagnosis not present

## 2019-01-19 DIAGNOSIS — I219 Acute myocardial infarction, unspecified: Secondary | ICD-10-CM | POA: Diagnosis not present

## 2019-01-21 DIAGNOSIS — Z4789 Encounter for other orthopedic aftercare: Secondary | ICD-10-CM | POA: Diagnosis not present

## 2019-01-21 DIAGNOSIS — M25511 Pain in right shoulder: Secondary | ICD-10-CM | POA: Diagnosis not present

## 2019-01-22 ENCOUNTER — Ambulatory Visit (INDEPENDENT_AMBULATORY_CARE_PROVIDER_SITE_OTHER): Payer: PPO | Admitting: *Deleted

## 2019-01-22 DIAGNOSIS — I480 Paroxysmal atrial fibrillation: Secondary | ICD-10-CM | POA: Diagnosis not present

## 2019-01-22 DIAGNOSIS — I495 Sick sinus syndrome: Secondary | ICD-10-CM

## 2019-01-23 LAB — CUP PACEART REMOTE DEVICE CHECK
Date Time Interrogation Session: 20200724201003
Implantable Pulse Generator Implant Date: 20190419

## 2019-01-27 ENCOUNTER — Telehealth: Payer: Self-pay

## 2019-01-27 NOTE — Progress Notes (Signed)
Cardiology Office Note   Date:  01/28/2019   ID:  Arnesha, Schiraldi 1946/02/10, MRN [id number]  PCP:  Sinda Du, MD  Cardiologist:  Dr. Rayann Heman    Chief Complaint  Patient presents with  . Hospitalization Follow-up      History of Present Illness: Erika Lee is a 73 y.o. female who presents for post hospitalization   She has a history of Afib/flutter with prior PVI, HTN, HLD,DM2,recent shoulder surgery who presented to [facility] with chest pain and is diagnosed with NSTEMI.  The patient has AF with prior PVI and follows in clinic with Dr. Rayann Heman. She was last evaluated for a virtual visit in June 2020 at which time she was doing well without any cardiac complaints; her loop recorder showed no recurrent AF.   She underwent surgical repair of L rotator cuff on 7/9, which she tolerated well.  She presented to the [facility] ED 01/11/19  with chest pain.  She was eating toast and it seemed to stick in esophagus- she thought that, so she drank more coffee and pain increased and she was SOB so went to ER.   In the ED, SBP 130-150s, HR upper 40s-50s. ECG showed sinus bradycardia with new TWIinI, AVL, VI, V2; Q waves present in 1 and AVL (although this was seen on prior ECGs). Troponin was 375, which rose to 434 on repeat check. K 3.1 COVID negative. CTA showed no PE. She was given ASA and started on a heparin gtt and transferred to [facility] for further care. Eliquis held.  cath with normal coronary arteries.  pk troponin was 434 HS.  Echo was stable.  Possible coronary spasm   Chest CT showed 5 mm nodule in the posterior left lung base on series 6, image 90, not seen in 2015. Stable nodule in the anterior right lung on series 6, image 154. No follow-up needed if patient is low-risk. Non-contrast chest CT can be considered in 12 months if patient is high-risk. She is a former 34 year smoker, quit in 1995  Today she is feeling well, no chest pain, some anxiety that it  will happen again.  We went over possible coronary spasm and I showed her her cath drawing.  No lightheadedness or rapid HR since that time.    Past Medical History:  Diagnosis Date  . Anticoagulant long-term use    eliquis  . Chronic sinusitis   . GERD (gastroesophageal reflux disease)   . Hemorrhoids   . History of colitis 10/2016   campylobacter  . Hyperlipidemia   . Hypertension   . Multinodular goiter    per pt has had 2 biopsy's both benign  . NSTEMI (non-ST elevated myocardial infarction) ([place])    12/2018- normal coronary arteries on cath, poss coronary vasospasm.   . Osteoarthritis    "all over"  and AC joint left shoulder  . Paroxysmal atrial fibrillation [facility]) EP cardiologist--  [name]   s/p PVI by [name] 07/16/2013;   pt first dx 2008,  recurrence 2014 afib/flutter and tachy-brady  . Rotator cuff tear, left   . Shoulder impingement, left   . Status post placement of implantable loop recorder    original placement 07-26-2014;  removal / replacement 10-17-2017  by dr allred  . Type 2 diabetes mellitus ([place])    followed by pcp    Past Surgical History:  Procedure Laterality Date  . ATRIAL FIBRILLATION ABLATION N/A 07/16/2013   PVI and CTI ablation  by [name]  . CARDIAC CATHETERIZATION     01/11/2019- Normal coronary arteries.   . COLONOSCOPY  09/10/2011   Dr. Arnoldo Morale: normal, 10 year follow up.  Marland Kitchen LEFT HEART CATH AND CORONARY ANGIOGRAPHY N/A 01/11/2019   Procedure: LEFT HEART CATH AND CORONARY ANGIOGRAPHY;  Surgeon: Lorretta Harp, MD;  Location: [place] CV LAB;  Service: Cardiovascular;  Laterality: N/A;  . LESION REMOVAL N/A 05/03/2013   Procedure: EXCISION CYST, BACK;  Surgeon: Jamesetta So, MD;  Location: [facility];  Service: General;  Laterality: N/A;  . LOOP RECORDER IMPLANT N/A 07/26/2014   Procedure: LOOP RECORDER IMPLANT;  Surgeon: Thompson Grayer, MD;  Location: [facility] CATH LAB;  Service: Cardiovascular;  Laterality: N/A;  . LOOP RECORDER INSERTION N/A  10/17/2017   MDT previously implanted ILR for afib management at [facility].  old device removed and new device placed by [name]  . LOOP RECORDER REMOVAL N/A 10/17/2017   MDT ILR removed with new device subsequently replaced  . SHOULDER ARTHROSCOPY WITH ROTATOR CUFF REPAIR AND SUBACROMIAL DECOMPRESSION Left 01/07/2019   Procedure: Left shoulder subacromial decompression, distal clavicle resection, extensive debridement,;  Surgeon: Nicholes Stairs, MD;  Location: [facility];  Service: Orthopedics;  Laterality: Left;  . TEE WITHOUT CARDIOVERSION N/A 07/15/2013   Procedure: TRANSESOPHAGEAL ECHOCARDIOGRAM (TEE);  Surgeon: Lelon Perla, MD;  Location: [facility] ENDOSCOPY;  Service: Cardiovascular;  Laterality: N/A;  . TONSILLECTOMY  age 88  . TRANSANAL HEMORRHOIDAL DEARTERIALIZATION N/A 09/22/2015   Procedure: TRANSANAL HEMORRHOIDAL LIGATION/PEXY EUA POSSIBLE HEMORRHOIDECTOMY ;  Surgeon: Michael Boston, MD;  Location: [facility] OR[facility];  Service: General;  Laterality: N/A;     Current Outpatient Medications  Medication Sig Dispense Refill  . apixaban (ELIQUIS) 5 MG TABS tablet Take 1 tablet (5 mg total) by mouth 2 (two) times daily. 180 tablet 3  . esomeprazole (NEXIUM) 20 MG capsule Take 20 mg by mouth daily.     . hydrochlorothiazide 25 MG tablet Take 25 mg by mouth daily.     Marland Kitchen HYDROcodone-acetaminophen (NORCO) 7.5-325 MG tablet Take 1 tablet by mouth every 6 (six) hours as needed for moderate pain.    Marland Kitchen lisinopril (PRINIVIL,ZESTRIL) 20 MG tablet TAKE 1 TABLET BY MOUTH DAILY 30 tablet 11  . metFORMIN (GLUCOPHAGE) 500 MG tablet Take 1 tablet (500 mg total) by mouth daily.    . metoprolol succinate (TOPROL-XL) 25 MG 24 hr tablet Take 0.5 tablets (12.5 mg total) by mouth daily. 45 tablet 3  . ondansetron (ZOFRAN ODT) 4 MG disintegrating tablet Take 1 tablet (4 mg total) by mouth every 8 (eight) hours as needed for nausea or vomiting. 20 tablet 0  . Potassium Gluconate 550 MG TABS Take 550 mg by mouth  at bedtime.     . promethazine (PHENERGAN) 12.5 MG tablet Take 12.5 mg by mouth 2 (two) times a day.    . simvastatin (ZOCOR) 40 MG tablet Take 40 mg by mouth at bedtime.      No current facility-administered medications for this visit.     Allergies:   Clindamycin/lincomycin, Doxycycline, Keflex [cephalexin], Adhesive [tape], Latex, and Sulfonamide derivatives    Social History:  The patient  reports that she quit smoking about 25 years ago. Her smoking use included cigarettes. She quit after 34.00 years of use. She has never used smokeless tobacco. She reports that she does not drink alcohol or use drugs.   Family History:  The patient's family history includes Kidney disease in her maternal grandfather; Other  in her maternal grandfather; Stroke in her maternal grandmother; Stroke (age of onset: 38) in her mother.    ROS:  General:no colds or fevers, no weight changes Skin:no rashes or ulcers HEENT:no blurred vision, no congestion CV:see HPI PUL:see HPI GI:no diarrhea constipation or melena, no indigestion GU:no hematuria, no dysuria MS:no joint pain, no claudication Neuro:no syncope, no lightheadedness Endo:+ diabetes, no thyroid disease  Wt Readings from Last 3 Encounters:  01/28/19 160 lb (72.6 kg)  01/11/19 159 lb 9.8 oz (72.4 kg)  01/07/19 164 lb 4.8 oz (74.5 kg)     PHYSICAL EXAM: VS:  BP 126/66   Pulse (!) 55   Ht 5\' 5"  (1.651 m)   Wt 160 lb (72.6 kg)   LMP  (LMP Unknown)   SpO2 97%   BMI 26.63 kg/m  , BMI Body mass index is 26.63 kg/m. General:Pleasant affect, NAD Skin:Warm and dry, brisk capillary refill HEENT:normocephalic, sclera clear, mucus membranes moist Neck:supple, no JVD, no bruits  Heart:S1S2 RRR without murmur, gallup, rub or click Lungs:clear without rales, rhonchi, or wheezes UQJ:FHLK, non tender, + BS, do not palpate liver spleen or masses Ext:no lower ext edema, 2+ pedal pulses, 2+ radial pulses Neuro:alert and oriented X 3, MAE, follows  commands, + facial symmetry    EKG:  EKG is ordered today. The ekg ordered today demonstrates SB at 78 and continued T wave inversion in V1 V2. Somewhat improved.   Recent Labs: 01/10/2019: BUN <5; Creatinine, Ser 0.69; Potassium 3.7; Sodium 137 01/11/2019: Hemoglobin 13.6; Platelets 211    Lipid Panel    Component Value Date/Time   CHOL 117 01/10/2019 0308   TRIG 79 01/10/2019 0308   HDL 53 01/10/2019 0308   CHOLHDL 2.2 01/10/2019 0308   VLDL 16 01/10/2019 0308   LDLCALC 48 01/10/2019 0308       Other studies Reviewed: Additional studies/ records that were reviewed today include: . Left heart cath 01/11/2019 IMPRESSION:Ms. Bolls has normal coronary arteries and normal LV function. The etiology of her chest pain, elevated troponins, EKG changes and wall motion and imagine 2D echo are still unclear. She has normal LV function by ventriculography. She may have had coronary spasm. The sheath was removed and a TR band was placed on the right wrist to achieve patent hemostasis. The patient left lab in stable condition. She can be discharged home later today on her home dose of Eliquis oral anticoagulation.  The left ventricular size is normal. The left ventricular systolic function is normal. LV end diastolic pressure is normal. The left ventricular ejection fraction is greater than 65% by visual estimate. No regional wall motion abnormalities. _____________  Echocardiogram 01/10/2019 IMPRESSIONS  1. Severe akinesis of the left ventricular, mid-apical anteroseptal wall. 2. The left ventricle has low normal systolic function, with an ejection fraction of 50-55%. The cavity size was normal. There is moderately increased left ventricular wall thickness. Left ventricular diastolic Doppler parameters are consistent with  impaired relaxation. 3. The right ventricle has normal systolic function. The cavity was normal. There is no increase in right ventricular wall thickness.      ASSESSMENT AND PLAN:  1.  NSTEMI may have been coronary spasm. Normal coronary arteries.  Will add NTG sl to meds. Patent coronary arteries.  Normal Echo, EKG abnormal and today somewhat improved.  Follow up with Dr. Rayann Heman in next month.  2.  PAF on eliquis with hx of PVI followed by Dr. Rayann Heman, will have pt seen in near future.  She  continues with SR. No bleeding.   3.  Lung nodule and thyroid nodule, to follow up with Dr.Hawkins.   4.  DM-2 per PCP  5.  HLD on statin and LDL well controlled       Current medicines are reviewed with the patient today.  The patient Has no concerns regarding medicines.  The following changes have been made:  See above Labs/ tests ordered today include:see above  Disposition:   FU:  see above  Signed, Cecilie Kicks, NP  01/28/2019 10:48 AM    [address], [place], [place], [place] Phone: [phone]; Fax: [phone]

## 2019-01-27 NOTE — Telephone Encounter (Signed)

## 2019-01-28 ENCOUNTER — Other Ambulatory Visit: Payer: Self-pay

## 2019-01-28 ENCOUNTER — Ambulatory Visit (INDEPENDENT_AMBULATORY_CARE_PROVIDER_SITE_OTHER): Payer: PPO | Admitting: Cardiology

## 2019-01-28 ENCOUNTER — Encounter: Payer: Self-pay | Admitting: Cardiology

## 2019-01-28 VITALS — BP 126/66 | HR 55 | Ht 65.0 in | Wt 160.0 lb

## 2019-01-28 DIAGNOSIS — I214 Non-ST elevation (NSTEMI) myocardial infarction: Secondary | ICD-10-CM

## 2019-01-28 DIAGNOSIS — R911 Solitary pulmonary nodule: Secondary | ICD-10-CM

## 2019-01-28 DIAGNOSIS — I480 Paroxysmal atrial fibrillation: Secondary | ICD-10-CM

## 2019-01-28 DIAGNOSIS — M25512 Pain in left shoulder: Secondary | ICD-10-CM | POA: Diagnosis not present

## 2019-01-28 DIAGNOSIS — E119 Type 2 diabetes mellitus without complications: Secondary | ICD-10-CM

## 2019-01-28 DIAGNOSIS — I10 Essential (primary) hypertension: Secondary | ICD-10-CM

## 2019-01-28 MED ORDER — NITROGLYCERIN 0.4 MG SL SUBL: 0.4000 mg | SUBLINGUAL_TABLET | SUBLINGUAL | 3 refills | Status: DC | PRN

## 2019-01-28 NOTE — Patient Instructions (Addendum)
Medication Instructions:   Take Nitroglycerin 0.4 mg as needed for chest pain   If you need a refill on your cardiac medications before your next appointment, please call your pharmacy.   Lab work: NONE ORDERED  TODAY   If you have labs (blood work) drawn today and your tests are completely normal, you will receive your results only by: Marland Kitchen MyChart Message (if you have MyChart) OR . A paper copy in the mail If you have any lab test that is abnormal or we need to change your treatment, we will call you to review the results.  Testing/Procedures: NONE ORDERED  TODAY   Follow-Up: FIRST AVAILABLE WITH [name]   Any Other Special Instructions Will Be Listed Below (If Applicable).

## 2019-01-28 NOTE — Progress Notes (Signed)
Carelink Summary Report / Loop Recorder 

## 2019-02-08 ENCOUNTER — Other Ambulatory Visit (HOSPITAL_COMMUNITY): Payer: Self-pay | Admitting: Pulmonary Disease

## 2019-02-08 DIAGNOSIS — E119 Type 2 diabetes mellitus without complications: Secondary | ICD-10-CM | POA: Diagnosis not present

## 2019-02-08 DIAGNOSIS — R911 Solitary pulmonary nodule: Secondary | ICD-10-CM | POA: Diagnosis not present

## 2019-02-08 DIAGNOSIS — I10 Essential (primary) hypertension: Secondary | ICD-10-CM | POA: Diagnosis not present

## 2019-02-08 DIAGNOSIS — E041 Nontoxic single thyroid nodule: Secondary | ICD-10-CM

## 2019-02-10 ENCOUNTER — Telehealth: Payer: Self-pay | Admitting: Internal Medicine

## 2019-02-10 NOTE — Telephone Encounter (Signed)
New Message   Larena Glassman with [facility] is calling to see if this patient has any type of restrictions. She states that a verbal okay is fine as well.

## 2019-02-11 NOTE — Telephone Encounter (Signed)
Returned call to [place] @ [facility]. Left a detailed message that more information was need re: pt and having restrictions and if she was needed some type of clearance, she would need to send a clearance over to [place] and have provided the fax number.

## 2019-02-12 ENCOUNTER — Ambulatory Visit (HOSPITAL_COMMUNITY)
Admission: RE | Admit: 2019-02-12 | Discharge: 2019-02-12 | Disposition: A | Discharge status: Home / Self Care | Payer: PPO | Source: Ambulatory Visit | Attending: Pulmonary Disease | Admitting: Pulmonary Disease

## 2019-02-12 ENCOUNTER — Other Ambulatory Visit: Payer: Self-pay

## 2019-02-12 DIAGNOSIS — E041 Nontoxic single thyroid nodule: Secondary | ICD-10-CM | POA: Diagnosis not present

## 2019-02-12 DIAGNOSIS — E042 Nontoxic multinodular goiter: Secondary | ICD-10-CM | POA: Diagnosis not present

## 2019-02-24 ENCOUNTER — Ambulatory Visit (INDEPENDENT_AMBULATORY_CARE_PROVIDER_SITE_OTHER): Payer: PPO | Admitting: *Deleted

## 2019-02-24 ENCOUNTER — Other Ambulatory Visit: Payer: Self-pay | Admitting: Internal Medicine

## 2019-02-24 DIAGNOSIS — I480 Paroxysmal atrial fibrillation: Secondary | ICD-10-CM

## 2019-02-25 LAB — CUP PACEART REMOTE DEVICE CHECK
Date Time Interrogation Session: 20200826204034
Implantable Pulse Generator Implant Date: 20190419

## 2019-02-26 ENCOUNTER — Other Ambulatory Visit: Payer: Self-pay

## 2019-03-01 ENCOUNTER — Telehealth (INDEPENDENT_AMBULATORY_CARE_PROVIDER_SITE_OTHER): Payer: PPO | Admitting: Internal Medicine

## 2019-03-01 ENCOUNTER — Encounter: Payer: Self-pay | Admitting: Internal Medicine

## 2019-03-01 VITALS — BP 156/83 | HR 56 | Ht 65.0 in | Wt 159.0 lb

## 2019-03-01 DIAGNOSIS — I480 Paroxysmal atrial fibrillation: Secondary | ICD-10-CM

## 2019-03-01 DIAGNOSIS — I10 Essential (primary) hypertension: Secondary | ICD-10-CM

## 2019-03-01 NOTE — Progress Notes (Signed)
Electrophysiology TeleHealth Note   Due to national recommendations of social distancing due to [place] 19, an audio telehealth visit is felt to be most appropriate for this patient at this time.  Verbal consent was obtained by me for the telehealth visit today.  The patient does not have capability for a virtual visit.  A phone visit is therefore required today.   Date:  03/01/2019   ID:  MAC DARRIN, DOB April 18, 1946, MRN [id number]  Location: patient's home  Provider location:  [facility]  Evaluation Performed: Follow-up visit  PCP:  Sinda Du, MD   Electrophysiologist:  [name]  Chief Complaint:  palpitations  History of Present Illness:    Erika Lee is a 73 y.o. female who presents via telehealth conferencing today.  Since last being seen in our clinic, the patient reports doing very well.  She had left shoulder surgery July 03/2019.  She developed chest pain subsequently and was admitted to [facility] 01/09/2019.  She had some HS troponin elevaiton and LV wall motion abnormality on echo.  Cath was performed which revealed normal cors and normal LV function. She has done well since that time.  She did have afib this past week for which she was unaware.  Today, she denies symptoms of palpitations, chest pain, shortness of breath,  lower extremity edema, dizziness, presyncope, or syncope.  The patient is otherwise without complaint today.  The patient denies symptoms of fevers, chills, cough, or new SOB worrisome for COVID 19.  Past Medical History:  Diagnosis Date   Anticoagulant long-term use    eliquis   Chronic sinusitis    GERD (gastroesophageal reflux disease)    Hemorrhoids    History of colitis 10/2016   campylobacter   Hyperlipidemia    Hypertension    Multinodular goiter    per pt has had 2 biopsy's both benign   NSTEMI (non-ST elevated myocardial infarction) ([place])    12/2018- normal coronary arteries on cath, poss coronary vasospasm.     Osteoarthritis    "all over"  and [facility] joint left shoulder   Paroxysmal atrial fibrillation [facility]) EP cardiologist--  [name]   s/p PVI by [name] 07/16/2013;   pt first dx 2008,  recurrence 2014 afib/flutter and tachy-brady   Rotator cuff tear, left    Shoulder impingement, left    Status post placement of implantable loop recorder    original placement 07-26-2014;  removal / replacement 10-17-2017  by dr    Type 2 diabetes mellitus ([place])    followed by pcp    Past Surgical History:  Procedure Laterality Date   ATRIAL FIBRILLATION ABLATION N/A 07/16/2013   PVI and CTI ablation by [name]   CARDIAC CATHETERIZATION     01/11/2019- Normal coronary arteries.    COLONOSCOPY  09/10/2011   Dr. Arnoldo Morale: normal, 10 year follow up.   LEFT HEART CATH AND CORONARY ANGIOGRAPHY N/A 01/11/2019   Procedure: LEFT HEART CATH AND CORONARY ANGIOGRAPHY;  Surgeon: Lorretta Harp, MD;  Location: [place] CV LAB;  Service: Cardiovascular;  Laterality: N/A;   LESION REMOVAL N/A 05/03/2013   Procedure: EXCISION CYST, BACK;  Surgeon: Jamesetta So, MD;  Location: [facility];  Service: General;  Laterality: N/A;   LOOP RECORDER IMPLANT N/A 07/26/2014   Procedure: LOOP RECORDER IMPLANT;  Surgeon: Thompson Grayer, MD;  Location: [facility] CATH LAB;  Service: Cardiovascular;  Laterality: N/A;   LOOP RECORDER INSERTION N/A 10/17/2017   MDT previously  implanted ILR for afib management at [facility].  old device removed and new device placed by [name]   LOOP RECORDER REMOVAL N/A 10/17/2017   MDT ILR removed with new device subsequently replaced   SHOULDER ARTHROSCOPY WITH ROTATOR CUFF REPAIR AND SUBACROMIAL DECOMPRESSION Left 01/07/2019   Procedure: Left shoulder subacromial decompression, distal clavicle resection, extensive debridement,;  Surgeon: Nicholes Stairs, MD;  Location: [facility];  Service: Orthopedics;  Laterality: Left;   TEE WITHOUT CARDIOVERSION N/A 07/15/2013   Procedure:  TRANSESOPHAGEAL ECHOCARDIOGRAM (TEE);  Surgeon: Lelon Perla, MD;  Location: [facility] ENDOSCOPY;  Service: Cardiovascular;  Laterality: N/A;   TONSILLECTOMY  age 89   TRANSANAL HEMORRHOIDAL DEARTERIALIZATION N/A 09/22/2015   Procedure: TRANSANAL HEMORRHOIDAL LIGATION/PEXY EUA POSSIBLE HEMORRHOIDECTOMY ;  Surgeon: Michael Boston, MD;  Location: [facility];  Service: General;  Laterality: N/A;    Current Outpatient Medications  Medication Sig Dispense Refill   apixaban (ELIQUIS) 5 MG TABS tablet Take 1 tablet (5 mg total) by mouth 2 (two) times daily. 180 tablet 3   esomeprazole (NEXIUM) 20 MG capsule Take 20 mg by mouth daily.      hydrochlorothiazide 25 MG tablet Take 25 mg by mouth daily.      HYDROcodone-acetaminophen (NORCO) 7.5-325 MG tablet Take 1 tablet by mouth every 6 (six) hours as needed for moderate pain.     lisinopril (PRINIVIL,ZESTRIL) 20 MG tablet TAKE 1 TABLET BY MOUTH DAILY 30 tablet 11   metFORMIN (GLUCOPHAGE) 500 MG tablet Take 1 tablet (500 mg total) by mouth daily.     metoprolol succinate (TOPROL-XL) 25 MG 24 hr tablet TAKE ONE-HALF TABLET BY MOUTH DAILY 45 tablet 3   nitroGLYCERIN (NITROSTAT) 0.4 MG SL tablet Place 1 tablet (0.4 mg total) under the tongue every 5 (five) minutes as needed for chest pain. 25 tablet 3   ondansetron (ZOFRAN ODT) 4 MG disintegrating tablet Take 1 tablet (4 mg total) by mouth every 8 (eight) hours as needed for nausea or vomiting. 20 tablet 0   Potassium Gluconate 550 MG TABS Take 550 mg by mouth at bedtime.      promethazine (PHENERGAN) 12.5 MG tablet Take 12.5 mg by mouth 2 (two) times a day.     simvastatin (ZOCOR) 40 MG tablet Take 40 mg by mouth at bedtime.      No current facility-administered medications for this visit.     Allergies:   Clindamycin/lincomycin, Doxycycline, Keflex [cephalexin], Adhesive [tape], Latex, and Sulfonamide derivatives   Social History:  The patient  reports that she quit smoking about 25 years ago. Her  smoking use included cigarettes. She quit after 34.00 years of use. She has never used smokeless tobacco. She reports that she does not drink alcohol or use drugs.   Family History:  The patient's family history includes Kidney disease in her maternal grandfather; Other in her maternal grandfather; Stroke in her maternal grandmother; Stroke (age of onset: 52) in her mother.   ROS:  Please see the history of present illness.   All other systems are personally reviewed and negative.    Exam:    Vital Signs:  BP (!) 156/83    Pulse (!) 56    Ht 5\' 5"  (1.651 m)    Wt 159 lb (72.1 kg)    LMP  (LMP Unknown)    BMI 26.46 kg/m   Well sounding  Labs/Other Tests and Data Reviewed:    Recent Labs: 01/10/2019: BUN <5; Creatinine, Ser 0.69; Potassium 3.7; Sodium 137  01/11/2019: Hemoglobin 13.6; Platelets 211   Wt Readings from Last 3 Encounters:  03/01/19 159 lb (72.1 kg)  01/28/19 160 lb (72.6 kg)  01/11/19 159 lb 9.8 oz (72.4 kg)    Cath 01/11/2019- normal cors  Echo 01/10/2019- EF 50-55%, akinesis of the mid apical and anteroseptal wall.  Last device remote is reviewed from [facility] which reveals afib 8/25, back in sinus 8/26  ASSESSMENT & PLAN:    1.  Paroxysmal atrial fibrillation  Well controlled She did have some afib on 8/25 but has converted back to sinus rhythm On eliquis for chads2vasc score of 5  2. HTN Stable No change required today  3. HL On statin  4. Atypical chest pain Cath 01/11/2019 is reviewed and shows normal cors  Follow-up as scheduled with me 11/2019   Patient Risk:  after full review of this patients clinical status, I feel that they are at moderate risk at this time.  Today, I have spent 15 minutes with the patient with telehealth technology discussing arrhythmia management .    SignedThompson Grayer, MD  03/01/2019 3:24 PM     [address] [place] [address] [phone] (office) [phone] (fax)

## 2019-03-04 NOTE — Progress Notes (Signed)
Carelink Summary Report / Loop Recorder 

## 2019-03-23 DIAGNOSIS — I219 Acute myocardial infarction, unspecified: Secondary | ICD-10-CM | POA: Diagnosis not present

## 2019-03-23 DIAGNOSIS — I480 Paroxysmal atrial fibrillation: Secondary | ICD-10-CM | POA: Diagnosis not present

## 2019-03-23 DIAGNOSIS — I10 Essential (primary) hypertension: Secondary | ICD-10-CM | POA: Diagnosis not present

## 2019-03-23 DIAGNOSIS — M545 Low back pain: Secondary | ICD-10-CM | POA: Diagnosis not present

## 2019-03-23 DIAGNOSIS — Z23 Encounter for immunization: Secondary | ICD-10-CM | POA: Diagnosis not present

## 2019-03-29 ENCOUNTER — Ambulatory Visit (INDEPENDENT_AMBULATORY_CARE_PROVIDER_SITE_OTHER): Payer: PPO | Admitting: *Deleted

## 2019-03-29 DIAGNOSIS — I495 Sick sinus syndrome: Secondary | ICD-10-CM

## 2019-03-29 DIAGNOSIS — I480 Paroxysmal atrial fibrillation: Secondary | ICD-10-CM

## 2019-03-30 LAB — CUP PACEART REMOTE DEVICE CHECK
Date Time Interrogation Session: 20200929181326
Implantable Pulse Generator Implant Date: 20190419

## 2019-04-06 NOTE — Progress Notes (Signed)
Carelink Summary Report / Loop Recorder 

## 2019-04-08 DIAGNOSIS — M25511 Pain in right shoulder: Secondary | ICD-10-CM | POA: Diagnosis not present

## 2019-04-08 DIAGNOSIS — M13812 Other specified arthritis, left shoulder: Secondary | ICD-10-CM | POA: Diagnosis not present

## 2019-04-08 DIAGNOSIS — M7542 Impingement syndrome of left shoulder: Secondary | ICD-10-CM | POA: Diagnosis not present

## 2019-04-19 ENCOUNTER — Ambulatory Visit (INDEPENDENT_AMBULATORY_CARE_PROVIDER_SITE_OTHER): Payer: PPO | Admitting: Family Medicine

## 2019-04-19 ENCOUNTER — Other Ambulatory Visit: Payer: Self-pay

## 2019-04-19 ENCOUNTER — Encounter: Payer: Self-pay | Admitting: Family Medicine

## 2019-04-19 VITALS — BP 144/82 | HR 56 | Temp 98.7°F | Resp 14 | Ht 65.0 in | Wt 163.0 lb

## 2019-04-19 DIAGNOSIS — Z1239 Encounter for other screening for malignant neoplasm of breast: Secondary | ICD-10-CM

## 2019-04-19 DIAGNOSIS — I480 Paroxysmal atrial fibrillation: Secondary | ICD-10-CM

## 2019-04-19 DIAGNOSIS — E041 Nontoxic single thyroid nodule: Secondary | ICD-10-CM | POA: Diagnosis not present

## 2019-04-19 DIAGNOSIS — E118 Type 2 diabetes mellitus with unspecified complications: Secondary | ICD-10-CM | POA: Diagnosis not present

## 2019-04-19 DIAGNOSIS — M2550 Pain in unspecified joint: Secondary | ICD-10-CM

## 2019-04-19 DIAGNOSIS — G8929 Other chronic pain: Secondary | ICD-10-CM

## 2019-04-19 DIAGNOSIS — I214 Non-ST elevation (NSTEMI) myocardial infarction: Secondary | ICD-10-CM

## 2019-04-19 DIAGNOSIS — Z7689 Persons encountering health services in other specified circumstances: Secondary | ICD-10-CM

## 2019-04-19 MED ORDER — HYDROCODONE-ACETAMINOPHEN 7.5-325 MG PO TABS: 1.0000 | ORAL_TABLET | Freq: Four times a day (QID) | ORAL | 0 refills | Status: DC | PRN

## 2019-04-20 NOTE — Progress Notes (Signed)
Subjective:    Patient ID: Erika Lee, female    DOB: 12-14-1945, 73 y.o.   MRN: [id number]  HPI  Patient is a very pleasant 73 year old Caucasian female here today to establish care with her husband.  She was previously under the care of Dr. Sinda Du who is retired.  She is transferring her care to this clinic.  Past medical history is significant for myocardial infarction.  Recently was hospitalized in July with chest pain.  Troponin levels were elevated suggesting a non-ST elevation myocardial infarction.  She was transferred to [facility] where she underwent catheterization that revealed normal coronary arteries.  Suspected cause was coronary vasospasm.  Patient also has a history of paroxysmal atrial fibrillation.  She has an implanted loop recorder performed by Dr. Thompson Grayer.  Recent review does show paroxysms of A. fib.  She is currently on Eliquis for prevention of stroke.  She denies any syncope or palpitations or tachycardia.  She also has a history of type 2 diabetes mellitus.  When she was admitted to the hospital in July, her hemoglobin A1c was well controlled at 6.5.  Her cholesterol was excellent as was the remainder of her lab work.  She is due for repeat labs in 3 months.  She also has a history of chronic joint pain.  She has had rotator cuff repair in her left shoulder.  She has persistent pain in the left shoulder.  She also has pain in her right shoulder and suspects that she needs a rotator cuff repair and that is well.  She also reports chronic low back pain as well as chronic knee pain.  Due to her anticoagulant, she is unable to take NSAIDs.  Therefore her previous PCP has been giving the patient hydrocodone/acetaminophen 7.5/325, 120 tablets every 30 days.  This equates to 4 tablets a day.  She takes it approximately every 6 hours for pain.  She uses MiraLAX to prevent chronic constipation.  I reviewed the national narcotics database to confirm no other prescribers.  She  does have 1 isolated prescription for 40 tablets of oxycodone by Dr. Victorino December.  Otherwise she is only been receiving her prescriptions for the last 2 years from Dr. Luan Pulling.  Therefore I agreed to prescribe the patient's chronic pain medication assuming that she received no other prescriptions from any other provider.  Regarding her cancer screening, the patient had a colonoscopy last in 2013.  She is due for repeat colonoscopy in 2023.  Recently she was found to have thyroid nodules on a CT scan.  Patient had thyroid ultrasound in August 2020.  Repeat ultrasound was recommended in August 2021.  She has not had a mammogram due to the presence of the loop recorder.  She states that her cardiologist recommended against a mammogram due to the pain that it would likely cause from the loop recorder. Past Medical History:  Diagnosis Date   Anticoagulant long-term use    eliquis   Chronic sinusitis    GERD (gastroesophageal reflux disease)    Hemorrhoids    History of colitis 10/2016   campylobacter   Hyperlipidemia    Hypertension    Multinodular goiter    per pt has had 2 biopsy's both benign   NSTEMI (non-ST elevated myocardial infarction) ([place])    12/2018- normal coronary arteries on cath, poss coronary vasospasm.    Osteoarthritis    "all over"  and [facility] joint left shoulder   Paroxysmal atrial fibrillation [facility]) EP cardiologist--  dr allred   s/p PVI by [name] 07/16/2013;   pt first dx 2008,  recurrence 2014 afib/flutter and tachy-brady   Rotator cuff tear, left    Shoulder impingement, left    Status post placement of implantable loop recorder    original placement 07-26-2014;  removal / replacement 10-17-2017  by dr allred   Type 2 diabetes mellitus ([place])    followed by pcp   Past Surgical History:  Procedure Laterality Date   ATRIAL FIBRILLATION ABLATION N/A 07/16/2013   PVI and CTI ablation by [name]   CARDIAC CATHETERIZATION     01/11/2019- Normal coronary  arteries.    COLONOSCOPY  09/10/2011   Dr. Arnoldo Morale: normal, 10 year follow up.   LEFT HEART CATH AND CORONARY ANGIOGRAPHY N/A 01/11/2019   Procedure: LEFT HEART CATH AND CORONARY ANGIOGRAPHY;  Surgeon: Lorretta Harp, MD;  Location: [place] CV [facility];  Service: Cardiovascular;  Laterality: N/A;   LESION REMOVAL N/A 05/03/2013   Procedure: EXCISION CYST, BACK;  Surgeon: Jamesetta So, MD;  Location: [facility];  Service: General;  Laterality: N/A;   LOOP RECORDER IMPLANT N/A 07/26/2014   Procedure: LOOP RECORDER IMPLANT;  Surgeon: Thompson Grayer, MD;  Location: [facility] CATH LAB;  Service: Cardiovascular;  Laterality: N/A;   LOOP RECORDER INSERTION N/A 10/17/2017   MDT previously implanted ILR for afib management at [facility].  old device removed and new device placed by [name]   LOOP RECORDER REMOVAL N/A 10/17/2017   MDT ILR removed with new device subsequently replaced   SHOULDER ARTHROSCOPY WITH ROTATOR CUFF REPAIR AND SUBACROMIAL DECOMPRESSION Left 01/07/2019   Procedure: Left shoulder subacromial decompression, distal clavicle resection, extensive debridement,;  Surgeon: Nicholes Stairs, MD;  Location: [facility];  Service: Orthopedics;  Laterality: Left;   TEE WITHOUT CARDIOVERSION N/A 07/15/2013   Procedure: TRANSESOPHAGEAL ECHOCARDIOGRAM (TEE);  Surgeon: Lelon Perla, MD;  Location: [facility] ENDOSCOPY;  Service: Cardiovascular;  Laterality: N/A;   TONSILLECTOMY  age 56   TRANSANAL HEMORRHOIDAL DEARTERIALIZATION N/A 09/22/2015   Procedure: TRANSANAL HEMORRHOIDAL LIGATION/PEXY EUA POSSIBLE HEMORRHOIDECTOMY ;  Surgeon: Michael Boston, MD;  Location: [facility] ORS;  Service: General;  Laterality: N/A;   Current Outpatient Medications on File Prior to Visit  Medication Sig Dispense Refill   apixaban (ELIQUIS) 5 MG TABS tablet Take 1 tablet (5 mg total) by mouth 2 (two) times daily. 180 tablet 3   esomeprazole (NEXIUM) 20 MG capsule Take 20 mg by mouth daily.      hydrochlorothiazide 25  MG tablet Take 25 mg by mouth daily.      lisinopril (PRINIVIL,ZESTRIL) 20 MG tablet TAKE 1 TABLET BY MOUTH DAILY 30 tablet 11   metFORMIN (GLUCOPHAGE) 500 MG tablet Take 1 tablet (500 mg total) by mouth daily.     metoprolol succinate (TOPROL-XL) 25 MG 24 hr tablet TAKE ONE-HALF TABLET BY MOUTH DAILY 45 tablet 3   nitroGLYCERIN (NITROSTAT) 0.4 MG SL tablet Place 1 tablet (0.4 mg total) under the tongue every 5 (five) minutes as needed for chest pain. 25 tablet 3   ondansetron (ZOFRAN ODT) 4 MG disintegrating tablet Take 1 tablet (4 mg total) by mouth every 8 (eight) hours as needed for nausea or vomiting. 20 tablet 0   Potassium Gluconate 550 MG TABS Take 550 mg by mouth at bedtime.      simvastatin (ZOCOR) 40 MG tablet Take 40 mg by mouth at bedtime.      No current facility-administered medications on file prior  to visit.    Allergies  Allergen Reactions   Clindamycin/Lincomycin Rash   Doxycycline Other (See Comments)    Makes heart race   Keflex [Cephalexin] Nausea And Vomiting   Adhesive [Tape] Rash   Latex Rash   Sulfonamide Derivatives Nausea And Vomiting   Social History   Socioeconomic History   Marital status: Married    Spouse name: Not on file   Number of children: Not on file   Years of education: Not on file   Highest education level: Not on file  Occupational History   Occupation: Pensions consultant: RETIRED    Comment: Full time  Scientist, product/process development strain: Not very hard   Food insecurity    Worry: Never true    Inability: Never true   Transportation needs    Medical: No    Non-medical: No  Tobacco Use   Smoking status: Former Smoker    Years: 34.00    Types: Cigarettes    Quit date: 01/02/1994    Years since quitting: 25.3   Smokeless tobacco: Never Used  Substance and Sexual Activity   Alcohol use: No   Drug use: No   Sexual activity: Not on file  Lifestyle   Physical activity    Days per week: 0  days    Minutes per session: 0 min   Stress: Only a little  Relationships   Press photographer on phone: Twice a week    Gets together: Never    Attends religious service: Not on file    Active member of club or organization: Not on file    Attends meetings of clubs or organizations: Not on file    Relationship status: Not on file   Intimate partner violence    Fear of current or ex partner: No    Emotionally abused: No    Physically abused: No    Forced sexual activity: No  Other Topics Concern   Not on file  Social History Narrative   Lives with spouse in [place]     Review of Systems  All other systems reviewed and are negative.      Objective:   Physical Exam Vitals signs reviewed.  Constitutional:      General: She is not in acute distress.    Appearance: Normal appearance. She is not ill-appearing or toxic-appearing.  Eyes:     Extraocular Movements: Extraocular movements intact.     Conjunctiva/sclera: Conjunctivae normal.     Pupils: Pupils are equal, round, and reactive to light.  Neck:     Musculoskeletal: Neck supple.  Cardiovascular:     Rate and Rhythm: Normal rate and regular rhythm.     Pulses: Normal pulses.     Heart sounds: Normal heart sounds. No murmur. No friction rub. No gallop.   Pulmonary:     Effort: Pulmonary effort is normal. No respiratory distress.     Breath sounds: Normal breath sounds. No stridor. No wheezing, rhonchi or rales.  Abdominal:     General: Abdomen is flat. Bowel sounds are normal. There is no distension.     Palpations: Abdomen is soft.     Tenderness: There is no abdominal tenderness. There is no guarding.  Musculoskeletal:     Right shoulder: She exhibits decreased range of motion, tenderness and pain.     Left shoulder: She exhibits decreased range of motion and pain.     Lumbar back: She exhibits decreased  range of motion, tenderness and pain.     Right lower leg: No edema.     Left lower leg: No  edema.  Skin:    General: Skin is warm.     Coloration: Skin is not jaundiced.     Findings: No bruising, erythema, lesion or rash.  Neurological:     General: No focal deficit present.     Mental Status: She is alert and oriented to person, place, and time.     Cranial Nerves: No cranial nerve deficit.     Sensory: No sensory deficit.     Motor: No weakness.     Coordination: Coordination normal.     Gait: Gait normal.     Deep Tendon Reflexes: Reflexes normal.  Psychiatric:        Mood and Affect: Mood normal.        Thought Content: Thought content normal.           Assessment & Plan:  Encounter to establish care with new doctor  NSTEMI (non-ST elevated myocardial infarction) ([place])  Paroxysmal atrial fibrillation ([place])  Controlled type 2 diabetes mellitus with complication, without long-term current use of insulin ([facility])  Chronic joint pain  Thyroid nodule - Plan: US THYROID  Breast cancer screening other than mammogram  I will continue to prescribe the patient 120 tablets of hydrocodone/acetaminophen 7.5/325 every month for her chronic low back pain and chronic shoulder pain.  She is currently appropriately anticoagulated for her paroxysmal atrial fibrillation.  Today she is in normal sinus rhythm and her rate is controlled.  Her last hemoglobin A1c was obtained in July and this was well controlled.  Therefore I recommended repeating a hemoglobin A1c with fasting lab work in 3 months.  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  Today we discussed switching hydrochlorothiazide to amlodipine.  I explained that amlodipine would likely exacerbate some mild swelling she has in her ankles due to chronic venous insufficiency and could potentially worsen her already present bradycardia.  She would like to discuss this with her cardiologist prior to making any changes.  I did explain that amlodipine may help prevent future coronary spasms.   Regarding her thyroid nodule I will schedule the patient for repeat thyroid ultrasound in 1 year.  I will contact radiology and see if the patient would be appropriate for a breast ultrasound to screen for malignancy since she is unable to tolerate a mammogram.  Recheck in 3 months

## 2019-04-21 ENCOUNTER — Telehealth: Payer: Self-pay | Admitting: Family Medicine

## 2019-04-21 DIAGNOSIS — M25511 Pain in right shoulder: Secondary | ICD-10-CM | POA: Diagnosis not present

## 2019-04-21 DIAGNOSIS — Z1231 Encounter for screening mammogram for malignant neoplasm of breast: Secondary | ICD-10-CM

## 2019-04-21 NOTE — Telephone Encounter (Signed)
Called patient per Dr.Pickard and left mail to inquire if she is willing to have a mammogram performed as per the Radiologist   the mammogram can still be performed even with a loop recorder. Awaiting return call from patient so that I may schedule if she would like to have a mammogram.

## 2019-04-21 NOTE — Telephone Encounter (Signed)
Patient returned call and stated that she is willing to have mammogram performed. Orders placed

## 2019-04-26 ENCOUNTER — Encounter: Payer: Self-pay | Admitting: Family Medicine

## 2019-04-26 ENCOUNTER — Ambulatory Visit (INDEPENDENT_AMBULATORY_CARE_PROVIDER_SITE_OTHER): Payer: PPO | Admitting: Family Medicine

## 2019-04-26 ENCOUNTER — Other Ambulatory Visit: Payer: Self-pay

## 2019-04-26 VITALS — BP 128/66 | HR 90 | Temp 98.2°F | Resp 14 | Ht 65.0 in | Wt 163.0 lb

## 2019-04-26 DIAGNOSIS — L299 Pruritus, unspecified: Secondary | ICD-10-CM

## 2019-04-26 MED ORDER — TRIAMCINOLONE ACETONIDE 0.1 % EX CREA: 1.0000 "application " | TOPICAL_CREAM | Freq: Two times a day (BID) | CUTANEOUS | 0 refills | Status: DC

## 2019-04-26 NOTE — Patient Instructions (Signed)
Try the cream twice a day  We will call with lab results F/U as previous

## 2019-04-26 NOTE — Progress Notes (Signed)
   Subjective:    Patient ID: Erika Lee, female    DOB: 1946-05-03, 73 y.o.   MRN: [id number]  Patient presents for Generalized Itching (x4 days- no changes is diet/ meds/etc.- itching to torso andarms that worsens at night- has tried [employer] with no relief)   Pt here with itching on neck near axilla, around abdomen and around legs where panty line is she has some red spots in these areas  no itching on chest, arms or legs   This has been going on for about a week  SHe has not changed soap or detergent   No new medications , no tick bites or inset bites     Review Of Systems:  GEN- denies fatigue, fever, weight loss,weakness, recent illness HEENT- denies eye drainage, change in vision, nasal discharge, CVS- denies chest pain, palpitations RESP- denies SOB, cough, wheeze ABD- denies N/V, change in stools, abd pain GU- denies dysuria, hematuria, dribbling, incontinence MSK- denies joint pain, muscle aches, injury Neuro- denies headache, dizziness, syncope, seizure activity       Objective:    BP 128/66   Pulse 90   Temp 98.2 F (36.8 C) (Temporal)   Resp 14   Ht 5\' 5"  (1.651 m)   Wt 163 lb (73.9 kg)   LMP  (LMP Unknown)   SpO2 96%   BMI 27.12 kg/m  GEN- NAD, alert and oriented x3 HEENT- PERRL, EOMI, non injected sclera, pink conjunctiva, MMM, oropharynx clear Neck- Supple, no lymphadenopathy CVS- RRR, no murmur RESP-CTAB Abdomen normal bowel sounds soft nontender nondistended Skin intact she has some mild erythema where her waistband of her panties is left in indentation on abdomen but no rash or dermatitis seen.  No excoriations Extremities chronic pedal edema       Assessment & Plan:      Problem List Items Addressed This Visit    None    Visit Diagnoses    Pruritus of skin    -  Primary   Unclear cause.  We will try topical steroid to these areas.  Also advised she can use Benadryl as all over.  I will check her kidney and liver function but no  sign of jaundice her blood pressure is normal there is no sign of significant fluid overload, no other signs of cholestasis   Relevant Orders   Comprehensive metabolic panel      Note: This dictation was prepared with Dragon dictation along with smaller phrase technology. Any transcriptional errors that result from this process are unintentional.

## 2019-04-27 LAB — COMPREHENSIVE METABOLIC PANEL
AG Ratio: 2.1 (calc) (ref 1.0–2.5)
ALT: 10 U/L (ref 6–29)
AST: 13 U/L (ref 10–35)
Albumin: 4 g/dL (ref 3.6–5.1)
Alkaline phosphatase (APISO): 67 U/L (ref 37–153)
BUN: 8 mg/dL (ref 7–25)
CO2: 26 mmol/L (ref 20–32)
Calcium: 9.7 mg/dL (ref 8.6–10.4)
Chloride: 100 mmol/L (ref 98–110)
Creat: 0.7 mg/dL (ref 0.60–0.93)
Globulin: 1.9 g/dL (calc) (ref 1.9–3.7)
Glucose, Bld: 162 mg/dL — ABNORMAL HIGH (ref 65–99)
Potassium: 3.7 mmol/L (ref 3.5–5.3)
Sodium: 136 mmol/L (ref 135–146)
Total Bilirubin: 0.4 mg/dL (ref 0.2–1.2)
Total Protein: 5.9 g/dL — ABNORMAL LOW (ref 6.1–8.1)

## 2019-05-02 LAB — CUP PACEART REMOTE DEVICE CHECK
Date Time Interrogation Session: 20201101174259
Implantable Pulse Generator Implant Date: 20190419

## 2019-05-03 ENCOUNTER — Ambulatory Visit (INDEPENDENT_AMBULATORY_CARE_PROVIDER_SITE_OTHER): Payer: PPO | Admitting: *Deleted

## 2019-05-03 DIAGNOSIS — I480 Paroxysmal atrial fibrillation: Secondary | ICD-10-CM

## 2019-05-06 DIAGNOSIS — M25811 Other specified joint disorders, right shoulder: Secondary | ICD-10-CM | POA: Diagnosis not present

## 2019-05-06 DIAGNOSIS — M25511 Pain in right shoulder: Secondary | ICD-10-CM | POA: Diagnosis not present

## 2019-05-07 ENCOUNTER — Telehealth: Payer: Self-pay

## 2019-05-07 NOTE — Telephone Encounter (Signed)
Pt is going to Dr Dennard Schaumann at [facility] - carried chart back to Dr Luan Pulling office.

## 2019-05-17 ENCOUNTER — Other Ambulatory Visit: Payer: Self-pay | Admitting: Cardiology

## 2019-05-21 ENCOUNTER — Other Ambulatory Visit: Payer: Self-pay | Admitting: Internal Medicine

## 2019-05-21 MED ORDER — LISINOPRIL 20 MG PO TABS: 20.0000 mg | ORAL_TABLET | Freq: Every day | ORAL | 2 refills | Status: DC

## 2019-05-24 ENCOUNTER — Telehealth: Payer: Self-pay | Admitting: Family Medicine

## 2019-05-24 ENCOUNTER — Other Ambulatory Visit: Payer: Self-pay

## 2019-05-24 MED ORDER — HYDROCODONE-ACETAMINOPHEN 7.5-325 MG PO TABS: 1.0000 | ORAL_TABLET | Freq: Four times a day (QID) | ORAL | 0 refills | Status: DC | PRN

## 2019-05-24 NOTE — Telephone Encounter (Signed)
[facility] [address]  Patient needs refill on hydrocodone   [phone]

## 2019-05-24 NOTE — Telephone Encounter (Signed)
Requested Prescriptions   Pending Prescriptions Disp Refills  . HYDROcodone-acetaminophen (NORCO) 7.5-325 MG tablet 120 tablet 0    Sig: Take 1 tablet by mouth every 6 (six) hours as needed for moderate pain.     Last OV 04/26/2019   Last written 04/19/2019

## 2019-05-25 NOTE — Progress Notes (Signed)
Carelink Summary Report / Loop Recorder 

## 2019-06-04 ENCOUNTER — Ambulatory Visit (INDEPENDENT_AMBULATORY_CARE_PROVIDER_SITE_OTHER): Payer: PPO | Admitting: *Deleted

## 2019-06-04 DIAGNOSIS — I480 Paroxysmal atrial fibrillation: Secondary | ICD-10-CM | POA: Diagnosis not present

## 2019-06-05 LAB — CUP PACEART REMOTE DEVICE CHECK
Date Time Interrogation Session: 20201204131913
Implantable Pulse Generator Implant Date: 20190419

## 2019-06-21 ENCOUNTER — Other Ambulatory Visit: Payer: Self-pay | Admitting: Family Medicine

## 2019-06-21 MED ORDER — HYDROCODONE-ACETAMINOPHEN 7.5-325 MG PO TABS: 1.0000 | ORAL_TABLET | Freq: Four times a day (QID) | ORAL | 0 refills | Status: DC | PRN

## 2019-06-21 NOTE — Telephone Encounter (Signed)
Ok to refill??  Last office visit 04/19/2019.  Last refill 05/24/2019.

## 2019-06-21 NOTE — Telephone Encounter (Signed)
Patient requesting a refill on her hydrocodone  7.5-3.25 called into [facility] [address] in [place].  CB# [phone]

## 2019-07-06 ENCOUNTER — Ambulatory Visit (INDEPENDENT_AMBULATORY_CARE_PROVIDER_SITE_OTHER): Payer: PPO | Admitting: *Deleted

## 2019-07-06 DIAGNOSIS — I480 Paroxysmal atrial fibrillation: Secondary | ICD-10-CM | POA: Diagnosis not present

## 2019-07-06 LAB — CUP PACEART REMOTE DEVICE CHECK
Date Time Interrogation Session: 20210105000443
Implantable Pulse Generator Implant Date: 20190419

## 2019-07-20 ENCOUNTER — Encounter: Payer: Self-pay | Admitting: Family Medicine

## 2019-07-20 ENCOUNTER — Other Ambulatory Visit: Payer: Self-pay

## 2019-07-20 ENCOUNTER — Ambulatory Visit (INDEPENDENT_AMBULATORY_CARE_PROVIDER_SITE_OTHER): Payer: PPO | Admitting: Family Medicine

## 2019-07-20 VITALS — BP 170/80 | HR 48 | Temp 97.4°F | Resp 12 | Ht 65.0 in | Wt 164.0 lb

## 2019-07-20 DIAGNOSIS — E118 Type 2 diabetes mellitus with unspecified complications: Secondary | ICD-10-CM | POA: Diagnosis not present

## 2019-07-20 DIAGNOSIS — I214 Non-ST elevation (NSTEMI) myocardial infarction: Secondary | ICD-10-CM | POA: Diagnosis not present

## 2019-07-20 DIAGNOSIS — I480 Paroxysmal atrial fibrillation: Secondary | ICD-10-CM

## 2019-07-20 MED ORDER — HYDROCODONE-ACETAMINOPHEN 10-325 MG PO TABS: 1.0000 | ORAL_TABLET | Freq: Four times a day (QID) | ORAL | 0 refills | Status: DC | PRN

## 2019-07-20 NOTE — Progress Notes (Signed)
Subjective:    Patient ID: Erika Lee, female    DOB: 1946/06/26, 74 y.o.   MRN: [id number]  HPI 10/20 Patient is a very pleasant 74 year old Caucasian female here today to establish care with her husband.  She was previously under the care of Dr. Sinda Du who is retired.  She is transferring her care to this clinic.  Past medical history is significant for myocardial infarction.  Recently was hospitalized in July with chest pain.  Troponin levels were elevated suggesting a non-ST elevation myocardial infarction.  She was transferred to [facility] where she underwent catheterization that revealed normal coronary arteries.  Suspected cause was coronary vasospasm.  Patient also has a history of paroxysmal atrial fibrillation.  She has an implanted loop recorder performed by Dr. Thompson Grayer.  Recent review does show paroxysms of A. fib.  She is currently on Eliquis for prevention of stroke.  She denies any syncope or palpitations or tachycardia.  She also has a history of type 2 diabetes mellitus.  When she was admitted to the hospital in July, her hemoglobin A1c was well controlled at 6.5.  Her cholesterol was excellent as was the remainder of her lab work.  She is due for repeat labs in 3 months.  She also has a history of chronic joint pain.  She has had rotator cuff repair in her left shoulder.  She has persistent pain in the left shoulder.  She also has pain in her right shoulder and suspects that she needs a rotator cuff repair and that is well.  She also reports chronic low back pain as well as chronic knee pain.  Due to her anticoagulant, she is unable to take NSAIDs.  Therefore her previous PCP has been giving the patient hydrocodone/acetaminophen 7.5/325, 120 tablets every 30 days.  This equates to 4 tablets a day.  She takes it approximately every 6 hours for pain.  She uses MiraLAX to prevent chronic constipation.  I reviewed the national narcotics database to confirm no other prescribers.   She does have 1 isolated prescription for 40 tablets of oxycodone by Dr. Victorino December.  Otherwise she is only been receiving her prescriptions for the last 2 years from Dr. Luan Pulling.  Therefore I agreed to prescribe the patient's chronic pain medication assuming that she received no other prescriptions from any other provider.  Regarding her cancer screening, the patient had a colonoscopy last in 2013.  She is due for repeat colonoscopy in 2023.  Recently she was found to have thyroid nodules on a CT scan.  Patient had thyroid ultrasound in August 2020.  Repeat ultrasound was recommended in August 2021.  She has not had a mammogram due to the presence of the loop recorder.  She states that her cardiologist recommended against a mammogram due to the pain that it would likely cause from the loop recorder.  At that time, my plan was: I will continue to prescribe the patient 120 tablets of hydrocodone/acetaminophen 7.5/325 every month for her chronic low back pain and chronic shoulder pain.  She is currently appropriately anticoagulated for her paroxysmal atrial fibrillation.  Today she is in normal sinus rhythm and her rate is controlled.  Her last hemoglobin A1c was obtained in July and this was well controlled.  Therefore I recommended repeating a hemoglobin A1c with fasting lab work in 3 months.  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  Today we discussed switching hydrochlorothiazide to amlodipine.  I explained that amlodipine would likely exacerbate some mild swelling she has in her ankles due to chronic venous insufficiency and could potentially worsen her already present bradycardia.  She would like to discuss this with her cardiologist prior to making any changes.  I did explain that amlodipine may help prevent future coronary spasms.  Regarding her thyroid nodule I will schedule the patient for repeat thyroid ultrasound in 1 year.  I will contact radiology and see if  the patient would be appropriate for a breast ultrasound to screen for malignancy since she is unable to tolerate a mammogram.  Recheck in 3 months  07/20/19 Patient has several concerns today.  First she would like to increase the dose of her pain medication.  She states that the 7.5 mg hydrocodone's last only an hour or so and then quickly wear off.  She takes these 4 times a day but they provide her very little relief.  She reports near constant pain in her lower back and now pain in both knees as well.  This limits her ability to cook, clean her home, or perform housework.  She is hoping that by increasing the strength of the medication she would be able to function better around the home and do the tasks that are required.  She is overdue for fasting lab work.  She denies any polyuria, polydipsia, or blurry vision.  She denies any hypoglycemic episodes however she is due for a hemoglobin A1c.  Of note, her blood pressure today is elevated at 170/80.  She has been checking her blood pressure at home and states that it is normally not this high however she is not sure how accurate her cuff is.  She denies any chest pain, shortness of breath, or dyspnea on exertion.  She does have mild bradycardia with a heart rate near 50 bpm and she is on metoprolol.  She is thought about the amlodipine I mentioned last time and has decided against amlodipine due to the potential for bradycardia worsening and also for the potential of worsening swelling in her extremities.  Past Medical History:  Diagnosis Date  . Anticoagulant long-term use    eliquis  . Chronic sinusitis   . GERD (gastroesophageal reflux disease)   . Hemorrhoids   . History of colitis 10/2016   campylobacter  . Hyperlipidemia   . Hypertension   . Multinodular goiter    per pt has had 2 biopsy's both benign  . NSTEMI (non-ST elevated myocardial infarction) ([place])    12/2018- normal coronary arteries on cath, poss coronary vasospasm.   .  Osteoarthritis    "all over"  and AC joint left shoulder  . Paroxysmal atrial fibrillation [facility]) EP cardiologist--  [name]   s/p PVI by [name] 07/16/2013;   pt first dx 2008,  recurrence 2014 afib/flutter and tachy-brady  . Rotator cuff tear, left   . Shoulder impingement, left   . Status post placement of implantable loop recorder    original placement 07-26-2014;  removal / replacement 10-17-2017  by dr allred  . Type 2 diabetes mellitus ([place])    followed by pcp   Past Surgical History:  Procedure Laterality Date  . ATRIAL FIBRILLATION ABLATION N/A 07/16/2013   PVI and CTI ablation by [name]  . CARDIAC CATHETERIZATION     01/11/2019- Normal coronary arteries.   . COLONOSCOPY  09/10/2011   Dr. Arnoldo Morale: normal, 10 year follow up.  Marland Kitchen LEFT HEART CATH AND CORONARY ANGIOGRAPHY N/A 01/11/2019  Procedure: LEFT HEART CATH AND CORONARY ANGIOGRAPHY;  Surgeon: Lorretta Harp, MD;  Location: [facility] CV LAB;  Service: Cardiovascular;  Laterality: N/A;  . LESION REMOVAL N/A 05/03/2013   Procedure: EXCISION CYST, BACK;  Surgeon: Jamesetta So, MD;  Location: [facility];  Service: General;  Laterality: N/A;  . LOOP RECORDER IMPLANT N/A 07/26/2014   Procedure: LOOP RECORDER IMPLANT;  Surgeon: Thompson Grayer, MD;  Location: [facility] CATH LAB;  Service: Cardiovascular;  Laterality: N/A;  . LOOP RECORDER INSERTION N/A 10/17/2017   MDT previously implanted ILR for afib management at [facility].  old device removed and new device placed by [name]  . LOOP RECORDER REMOVAL N/A 10/17/2017   MDT ILR removed with new device subsequently replaced  . SHOULDER ARTHROSCOPY WITH ROTATOR CUFF REPAIR AND SUBACROMIAL DECOMPRESSION Left 01/07/2019   Procedure: Left shoulder subacromial decompression, distal clavicle resection, extensive debridement,;  Surgeon: Nicholes Stairs, MD;  Location: [facility];  Service: Orthopedics;  Laterality: Left;  . TEE WITHOUT CARDIOVERSION N/A 07/15/2013   Procedure:  TRANSESOPHAGEAL ECHOCARDIOGRAM (TEE);  Surgeon: Lelon Perla, MD;  Location: [facility] ENDOSCOPY;  Service: Cardiovascular;  Laterality: N/A;  . TONSILLECTOMY  age 73  . TRANSANAL HEMORRHOIDAL DEARTERIALIZATION N/A 09/22/2015   Procedure: TRANSANAL HEMORRHOIDAL LIGATION/PEXY EUA POSSIBLE HEMORRHOIDECTOMY ;  Surgeon: Michael Boston, MD;  Location: [facility] ORS;  Service: General;  Laterality: N/A;   Current Outpatient Medications on File Prior to Visit  Medication Sig Dispense Refill  . apixaban (ELIQUIS) 5 MG TABS tablet Take 1 tablet (5 mg total) by mouth 2 (two) times daily. 180 tablet 3  . esomeprazole (NEXIUM) 20 MG capsule Take 20 mg by mouth daily.     . hydrochlorothiazide 25 MG tablet Take 25 mg by mouth daily.     Marland Kitchen HYDROcodone-acetaminophen (NORCO) 7.5-325 MG tablet Take 1 tablet by mouth every 6 (six) hours as needed for moderate pain. 120 tablet 0  . lisinopril (ZESTRIL) 20 MG tablet Take 1 tablet (20 mg total) by mouth daily. 90 tablet 2  . metFORMIN (GLUCOPHAGE) 500 MG tablet Take 1 tablet (500 mg total) by mouth daily.    . metoprolol succinate (TOPROL-XL) 25 MG 24 hr tablet TAKE ONE-HALF TABLET BY MOUTH DAILY 45 tablet 3  . nitroGLYCERIN (NITROSTAT) 0.4 MG SL tablet PLACE 1 TABLET UNDER THE TONGUE EVERY 5 MINUTES AS NEEDED FOR CHEST PAIN 25 tablet 3  . ondansetron (ZOFRAN ODT) 4 MG disintegrating tablet Take 1 tablet (4 mg total) by mouth every 8 (eight) hours as needed for nausea or vomiting. 20 tablet 0  . Potassium Gluconate 550 MG TABS Take 550 mg by mouth at bedtime.     . simvastatin (ZOCOR) 40 MG tablet Take 40 mg by mouth at bedtime.     . triamcinolone cream (KENALOG) 0.1 % Apply 1 application topically 2 (two) times daily. 45 g 0   No current facility-administered medications on file prior to visit.   Allergies  Allergen Reactions  . Clindamycin/Lincomycin Rash  . Doxycycline Other (See Comments)    Makes heart race  . Keflex [Cephalexin] Nausea And Vomiting  . Adhesive  [Tape] Rash  . Latex Rash  . Sulfonamide Derivatives Nausea And Vomiting   Social History   Socioeconomic History  . Marital status: Married    Spouse name: Not on file  . Number of children: Not on file  . Years of education: Not on file  . Highest education level: Not on file  Occupational History  .  Occupation: Pensions consultant: RETIRED    Comment: Full time  Tobacco Use  . Smoking status: Former Smoker    Years: 34.00    Types: Cigarettes    Quit date: 01/02/1994    Years since quitting: 25.5  . Smokeless tobacco: Never Used  Substance and Sexual Activity  . Alcohol use: No  . Drug use: No  . Sexual activity: Not on file  Other Topics Concern  . Not on file  Social History Narrative   Lives with spouse in [place]   Social Determinants of Health   Financial Resource Strain: Low Risk   . Difficulty of Paying Living Expenses: Not very hard  Food Insecurity: No Food Insecurity  . Worried About Charity fundraiser in the Last Year: Never true  . Ran Out of Food in the Last Year: Never true  Transportation Needs: No Transportation Needs  . Lack of Transportation (Medical): No  . Lack of Transportation (Non-Medical): No  Physical Activity: Inactive  . Days of Exercise per Week: 0 days  . Minutes of Exercise per Session: 0 min  Stress: No Stress Concern Present  . Feeling of Stress : Only a little  Social Connections: Unknown  . Frequency of Communication with Friends and Family: Twice a week  . Frequency of Social Gatherings with Friends and Family: Never  . Attends Religious Services: Not on file  . Active Member of Clubs or Organizations: Not on file  . Attends Archivist Meetings: Not on file  . Marital Status: Not on file  Intimate Partner Violence: Not At Risk  . Fear of Current or Ex-Partner: No  . Emotionally Abused: No  . Physically Abused: No  . Sexually Abused: No     Review of Systems  All other systems reviewed and are  negative.      Objective:   Physical Exam Vitals reviewed.  Constitutional:      General: She is not in acute distress.    Appearance: Normal appearance. She is not ill-appearing or toxic-appearing.  Eyes:     Extraocular Movements: Extraocular movements intact.     Conjunctiva/sclera: Conjunctivae normal.     Pupils: Pupils are equal, round, and reactive to light.  Cardiovascular:     Rate and Rhythm: Normal rate and regular rhythm.     Pulses: Normal pulses.     Heart sounds: Normal heart sounds. No murmur. No friction rub. No gallop.   Pulmonary:     Effort: Pulmonary effort is normal. No respiratory distress.     Breath sounds: Normal breath sounds. No stridor. No wheezing, rhonchi or rales.  Abdominal:     General: Abdomen is flat. Bowel sounds are normal. There is no distension.     Palpations: Abdomen is soft.     Tenderness: There is no abdominal tenderness. There is no guarding.  Musculoskeletal:     Right shoulder: Tenderness present. Decreased range of motion.     Left shoulder: Decreased range of motion.     Cervical back: Neck supple.     Lumbar back: Tenderness present. Decreased range of motion.     Right lower leg: No edema.     Left lower leg: No edema.  Skin:    General: Skin is warm.     Coloration: Skin is not jaundiced.     Findings: No bruising, erythema, lesion or rash.  Neurological:     General: No focal deficit present.     Mental  Status: She is alert and oriented to person, place, and time.     Cranial Nerves: No cranial nerve deficit.     Sensory: No sensory deficit.     Motor: No weakness.     Coordination: Coordination normal.     Gait: Gait normal.     Deep Tendon Reflexes: Reflexes normal.  Psychiatric:        Mood and Affect: Mood normal.        Thought Content: Thought content normal.           Assessment & Plan:  Controlled type 2 diabetes mellitus with complication, without long-term current use of insulin (HCC) - Plan:  Hemoglobin A1c, CBC with Differential/Platelet, COMPLETE METABOLIC PANEL WITH GFR, Lipid panel, Microalbumin, urine  Paroxysmal atrial fibrillation (HCC)  NSTEMI (non-ST elevated myocardial infarction) ([place])  I will check a hemoglobin A1c.  Goal hemoglobin A1c is less than 6.5.  I am very concerned about her blood pressure today.  Of asked the patient to check her blood pressure twice a day for the next few days and call us Thursday with the results.  If consistently greater than 140/90, I would add doxazosin to her blood pressure medication.  Check a fasting lipid panel.  Goal LDL cholesterol is less than 70.  Also check a CBC to rule out any anemia due to her chronic anticoagulation with Eliquis.  Today she is in normal sinus rhythm however she is appropriately anticoagulated with Eliquis given her history of paroxysmal atrial fibrillation.  She does have mild bradycardia which is asymptomatic.  If this worsens we would need to discontinue metoprolol.

## 2019-07-21 ENCOUNTER — Other Ambulatory Visit: Payer: PPO

## 2019-07-21 DIAGNOSIS — E118 Type 2 diabetes mellitus with unspecified complications: Secondary | ICD-10-CM | POA: Diagnosis not present

## 2019-07-22 LAB — MICROALBUMIN, URINE: Microalb, Ur: 0.5 mg/dL

## 2019-07-22 LAB — COMPLETE METABOLIC PANEL WITH GFR
AG Ratio: 1.9 (calc) (ref 1.0–2.5)
ALT: 10 U/L (ref 6–29)
AST: 13 U/L (ref 10–35)
Albumin: 4.2 g/dL (ref 3.6–5.1)
Alkaline phosphatase (APISO): 74 U/L (ref 37–153)
BUN: 8 mg/dL (ref 7–25)
CO2: 29 mmol/L (ref 20–32)
Calcium: 9.8 mg/dL (ref 8.6–10.4)
Chloride: 97 mmol/L — ABNORMAL LOW (ref 98–110)
Creat: 0.75 mg/dL (ref 0.60–0.93)
GFR, Est African American: 90 mL/min/{1.73_m2} (ref >=60)
GFR, Est Non African American: 78 mL/min/{1.73_m2} (ref >=60)
Globulin: 2.2 g/dL (calc) (ref 1.9–3.7)
Glucose, Bld: 127 mg/dL — ABNORMAL HIGH (ref 65–99)
Potassium: 3.8 mmol/L (ref 3.5–5.3)
Sodium: 136 mmol/L (ref 135–146)
Total Bilirubin: 0.6 mg/dL (ref 0.2–1.2)
Total Protein: 6.4 g/dL (ref 6.1–8.1)

## 2019-07-22 LAB — CBC WITH DIFFERENTIAL/PLATELET
Absolute Monocytes: 766 cells/uL (ref 200–950)
Basophils Absolute: 40 cells/uL (ref 0–200)
Basophils Relative: 0.6 %
Eosinophils Absolute: 178 cells/uL (ref 15–500)
Eosinophils Relative: 2.7 %
HCT: 44.4 % (ref 35.0–45.0)
Hemoglobin: 14.8 g/dL (ref 11.7–15.5)
Lymphs Abs: 2455 cells/uL (ref 850–3900)
MCH: 28.8 pg (ref 27.0–33.0)
MCHC: 33.3 g/dL (ref 32.0–36.0)
MCV: 86.5 fL (ref 80.0–100.0)
MPV: 9.8 fL (ref 7.5–12.5)
Monocytes Relative: 11.6 %
Neutro Abs: 3161 cells/uL (ref 1500–7800)
Neutrophils Relative %: 47.9 %
Platelets: 208 10*3/uL (ref 140–400)
RBC: 5.13 10*6/uL — ABNORMAL HIGH (ref 3.80–5.10)
RDW: 12 % (ref 11.0–15.0)
Total Lymphocyte: 37.2 %
WBC: 6.6 10*3/uL (ref 3.8–10.8)

## 2019-07-22 LAB — LIPID PANEL
Cholesterol: 146 mg/dL (ref <200)
HDL: 50 mg/dL (ref >=50)
LDL Cholesterol (Calc): 76 mg/dL (calc)
Non-HDL Cholesterol (Calc): 96 mg/dL (calc) (ref <130)
Total CHOL/HDL Ratio: 2.9 (calc) (ref <5.0)
Triglycerides: 123 mg/dL (ref <150)

## 2019-07-22 LAB — HEMOGLOBIN A1C
Hgb A1c MFr Bld: 6.6 % of total Hgb — ABNORMAL HIGH (ref <5.7)
Mean Plasma Glucose: 143 (calc)
eAG (mmol/L): 7.9 (calc)

## 2019-08-03 ENCOUNTER — Telehealth: Payer: Self-pay | Admitting: Family Medicine

## 2019-08-03 NOTE — Telephone Encounter (Signed)
Patient stopped by office asking for a letter for okay to get the vaccine since she is on blood thinner. She states that when she made the appointment to get the vaccine they were advised she would need this. She also wanted to know if you were going to change her blood pressure medication since her top number was running high.  She brought in her readings last week.   CB# [phone]

## 2019-08-03 NOTE — Telephone Encounter (Signed)
Have you seen her readings?     (I printed letter)

## 2019-08-03 NOTE — Telephone Encounter (Signed)
I have not seen BP readings.

## 2019-08-05 MED ORDER — LISINOPRIL 40 MG PO TABS: 40.0000 mg | ORAL_TABLET | Freq: Every day | ORAL | 3 refills | Status: DC

## 2019-08-05 NOTE — Telephone Encounter (Signed)
BP Readings: 163/79,162/76,136/76,149/79,136/76,149/79,150/80,147/81,150/78,173/80,161/79,173/80,161/79,149/79,144/83,150/79. Heart rate - high 40's to high 50's  Per [name] increase Lisinopril 40mg . Pt aware and med sent to pharm.

## 2019-08-05 NOTE — Telephone Encounter (Deleted)
Note done and pt aware.   Per [name]  - BP's are better after starting the Amlodipine.

## 2019-08-06 ENCOUNTER — Ambulatory Visit (INDEPENDENT_AMBULATORY_CARE_PROVIDER_SITE_OTHER): Payer: PPO | Admitting: *Deleted

## 2019-08-06 DIAGNOSIS — I480 Paroxysmal atrial fibrillation: Secondary | ICD-10-CM

## 2019-08-06 LAB — CUP PACEART REMOTE DEVICE CHECK
Date Time Interrogation Session: 20210205002001
Implantable Pulse Generator Implant Date: 20190418200000

## 2019-08-06 NOTE — Progress Notes (Signed)
ILR REmote 

## 2019-08-20 ENCOUNTER — Telehealth: Payer: Self-pay | Admitting: Family Medicine

## 2019-08-20 ENCOUNTER — Other Ambulatory Visit: Payer: Self-pay

## 2019-08-20 MED ORDER — HYDROCODONE-ACETAMINOPHEN 7.5-325 MG PO TABS: 1.0000 | ORAL_TABLET | Freq: Four times a day (QID) | ORAL | 0 refills | Status: DC | PRN

## 2019-08-20 NOTE — Telephone Encounter (Signed)
Requested Prescriptions   Pending Prescriptions Disp Refills  . HYDROcodone-acetaminophen (NORCO) 7.5-325 MG tablet 120 tablet 0    Sig: Take 1 tablet by mouth every 6 (six) hours as needed for moderate pain.     Last OV 07/20/2019   Last written 07/20/2019

## 2019-08-20 NOTE — Telephone Encounter (Signed)
Patient asking for refill on hydrocodone

## 2019-09-01 ENCOUNTER — Telehealth: Payer: Self-pay | Admitting: Family Medicine

## 2019-09-01 MED ORDER — PROMETHAZINE HCL 12.5 MG PO TABS: 12.5000 mg | ORAL_TABLET | Freq: Three times a day (TID) | ORAL | 0 refills | Status: DC | PRN

## 2019-09-01 NOTE — Telephone Encounter (Signed)
Patient stopped in requesting a refill on her promethazine 12.5 mg called into [facility]  [address]  CB# [phone]

## 2019-09-01 NOTE — Telephone Encounter (Signed)
Medication called/sent to requested pharmacy  

## 2019-09-06 ENCOUNTER — Ambulatory Visit (INDEPENDENT_AMBULATORY_CARE_PROVIDER_SITE_OTHER): Payer: PPO | Admitting: *Deleted

## 2019-09-06 DIAGNOSIS — I480 Paroxysmal atrial fibrillation: Secondary | ICD-10-CM

## 2019-09-06 LAB — CUP PACEART REMOTE DEVICE CHECK
Date Time Interrogation Session: 20210308054514
Implantable Pulse Generator Implant Date: 20190419

## 2019-09-06 NOTE — Progress Notes (Signed)
ILR Remote 

## 2019-09-17 ENCOUNTER — Other Ambulatory Visit: Payer: Self-pay | Admitting: Family Medicine

## 2019-09-17 MED ORDER — HYDROCODONE-ACETAMINOPHEN 7.5-325 MG PO TABS: 1.0000 | ORAL_TABLET | Freq: Four times a day (QID) | ORAL | 0 refills | Status: DC | PRN

## 2019-09-17 MED ORDER — PROMETHAZINE HCL 12.5 MG PO TABS: 12.5000 mg | ORAL_TABLET | Freq: Three times a day (TID) | ORAL | 0 refills | Status: DC | PRN

## 2019-09-17 NOTE — Telephone Encounter (Signed)
Hydrocodone last refilled: 08/20/2019 Phenergan last refilled: 09/01/2019

## 2019-09-17 NOTE — Telephone Encounter (Signed)
Patient needs refill on 10 mg hydrocodone  [facility] [place]  And promethazine

## 2019-09-28 ENCOUNTER — Other Ambulatory Visit: Payer: Self-pay

## 2019-09-28 ENCOUNTER — Ambulatory Visit: Payer: PPO

## 2019-09-28 DIAGNOSIS — Z0130 Encounter for examination of blood pressure without abnormal findings: Secondary | ICD-10-CM

## 2019-09-28 NOTE — Progress Notes (Signed)
Patient came in with blood pressure cuff with abnormal readings wanted BP checked with our cuff to see accuracy. Manual BP was 144/72. BP with her BP cuff was 178/85. Advised patient to replace BP cuff as she states she had it for several years. Patient states she will be a new cuff.

## 2019-10-02 ENCOUNTER — Other Ambulatory Visit: Payer: Self-pay | Admitting: Family Medicine

## 2019-10-07 ENCOUNTER — Ambulatory Visit (INDEPENDENT_AMBULATORY_CARE_PROVIDER_SITE_OTHER): Payer: PPO | Admitting: *Deleted

## 2019-10-07 DIAGNOSIS — I480 Paroxysmal atrial fibrillation: Secondary | ICD-10-CM | POA: Diagnosis not present

## 2019-10-07 LAB — CUP PACEART REMOTE DEVICE CHECK
Date Time Interrogation Session: 20210408064801
Implantable Pulse Generator Implant Date: 20190419

## 2019-10-07 NOTE — Progress Notes (Signed)
ILR Remote 

## 2019-10-16 ENCOUNTER — Other Ambulatory Visit: Payer: Self-pay | Admitting: Family Medicine

## 2019-10-19 ENCOUNTER — Other Ambulatory Visit: Payer: Self-pay

## 2019-10-19 ENCOUNTER — Encounter: Payer: Self-pay | Admitting: Family Medicine

## 2019-10-19 ENCOUNTER — Ambulatory Visit (INDEPENDENT_AMBULATORY_CARE_PROVIDER_SITE_OTHER): Payer: PPO | Admitting: Family Medicine

## 2019-10-19 VITALS — BP 130/76 | HR 52 | Temp 97.4°F | Resp 16 | Ht 65.0 in | Wt 162.0 lb

## 2019-10-19 DIAGNOSIS — I214 Non-ST elevation (NSTEMI) myocardial infarction: Secondary | ICD-10-CM | POA: Diagnosis not present

## 2019-10-19 DIAGNOSIS — I480 Paroxysmal atrial fibrillation: Secondary | ICD-10-CM | POA: Diagnosis not present

## 2019-10-19 DIAGNOSIS — I10 Essential (primary) hypertension: Secondary | ICD-10-CM

## 2019-10-19 DIAGNOSIS — E118 Type 2 diabetes mellitus with unspecified complications: Secondary | ICD-10-CM | POA: Diagnosis not present

## 2019-10-19 MED ORDER — ONDANSETRON 4 MG PO TBDP: 4.0000 mg | ORAL_TABLET | Freq: Three times a day (TID) | ORAL | 3 refills | Status: DC | PRN

## 2019-10-19 MED ORDER — SIMVASTATIN 40 MG PO TABS: 40.0000 mg | ORAL_TABLET | Freq: Every day | ORAL | 1 refills | Status: DC

## 2019-10-19 MED ORDER — HYDROCODONE-ACETAMINOPHEN 10-325 MG PO TABS: 1.0000 | ORAL_TABLET | Freq: Three times a day (TID) | ORAL | 0 refills | Status: DC | PRN

## 2019-10-19 NOTE — Addendum Note (Signed)
Addended by: Shary Decamp B on: 10/19/2019 03:21 PM   Modules accepted: Orders

## 2019-10-19 NOTE — Progress Notes (Signed)
Subjective:    Patient ID: Erika Lee, female    DOB: 1946-03-10, 74 y.o.   MRN: [id number]  HPI 10/20 Patient is a very pleasant 74 year old Caucasian female here today to establish care with her husband.  She was previously under the care of Dr. Sinda Du who is retired.  She is transferring her care to this clinic.  Past medical history is significant for myocardial infarction.  Recently was hospitalized in July with chest pain.  Troponin levels were elevated suggesting a non-ST elevation myocardial infarction.  She was transferred to [facility] where she underwent catheterization that revealed normal coronary arteries.  Suspected cause was coronary vasospasm.  Patient also has a history of paroxysmal atrial fibrillation.  She has an implanted loop recorder performed by Dr. Thompson Grayer.  Recent review does show paroxysms of A. fib.  She is currently on Eliquis for prevention of stroke.  She denies any syncope or palpitations or tachycardia.  She also has a history of type 2 diabetes mellitus.  When she was admitted to the hospital in July, her hemoglobin A1c was well controlled at 6.5.  Her cholesterol was excellent as was the remainder of her lab work.  She is due for repeat labs in 3 months.  She also has a history of chronic joint pain.  She has had rotator cuff repair in her left shoulder.  She has persistent pain in the left shoulder.  She also has pain in her right shoulder and suspects that she needs a rotator cuff repair and that is well.  She also reports chronic low back pain as well as chronic knee pain.  Due to her anticoagulant, she is unable to take NSAIDs.  Therefore her previous PCP has been giving the patient hydrocodone/acetaminophen 7.5/325, 120 tablets every 30 days.  This equates to 4 tablets a day.  She takes it approximately every 6 hours for pain.  She uses MiraLAX to prevent chronic constipation.  I reviewed the national narcotics database to confirm no other prescribers.   She does have 1 isolated prescription for 40 tablets of oxycodone by Dr. Victorino December.  Otherwise she is only been receiving her prescriptions for the last 2 years from Dr. Luan Pulling.  Therefore I agreed to prescribe the patient's chronic pain medication assuming that she received no other prescriptions from any other provider.  Regarding her cancer screening, the patient had a colonoscopy last in 2013.  She is due for repeat colonoscopy in 2023.  Recently she was found to have thyroid nodules on a CT scan.  Patient had thyroid ultrasound in August 2020.  Repeat ultrasound was recommended in August 2021.  She has not had a mammogram due to the presence of the loop recorder.  She states that her cardiologist recommended against a mammogram due to the pain that it would likely cause from the loop recorder.  At that time, my plan was: I will continue to prescribe the patient 120 tablets of hydrocodone/acetaminophen 7.5/325 every month for her chronic low back pain and chronic shoulder pain.  She is currently appropriately anticoagulated for her paroxysmal atrial fibrillation.  Today she is in normal sinus rhythm and her rate is controlled.  Her last hemoglobin A1c was obtained in July and this was well controlled.  Therefore I recommended repeating a hemoglobin A1c with fasting lab work in 3 months.  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  Today we discussed switching hydrochlorothiazide to amlodipine.  I explained that amlodipine would likely exacerbate some mild swelling she has in her ankles due to chronic venous insufficiency and could potentially worsen her already present bradycardia.  She would like to discuss this with her cardiologist prior to making any changes.  I did explain that amlodipine may help prevent future coronary spasms.  Regarding her thyroid nodule I will schedule the patient for repeat thyroid ultrasound in 1 year.  I will contact radiology and see if  the patient would be appropriate for a breast ultrasound to screen for malignancy since she is unable to tolerate a mammogram.  Recheck in 3 months  07/20/19 Patient has several concerns today.  First she would like to increase the dose of her pain medication.  She states that the 7.5 mg hydrocodone's last only an hour or so and then quickly wear off.  She takes these 4 times a day but they provide her very little relief.  She reports near constant pain in her lower back and now pain in both knees as well.  This limits her ability to cook, clean her home, or perform housework.  She is hoping that by increasing the strength of the medication she would be able to function better around the home and do the tasks that are required.  She is overdue for fasting lab work.  She denies any polyuria, polydipsia, or blurry vision.  She denies any hypoglycemic episodes however she is due for a hemoglobin A1c.  Of note, her blood pressure today is elevated at 170/80.  She has been checking her blood pressure at home and states that it is normally not this high however she is not sure how accurate her cuff is.  She denies any chest pain, shortness of breath, or dyspnea on exertion.  She does have mild bradycardia with a heart rate near 50 bpm and she is on metoprolol.  She is thought about the amlodipine I mentioned last time and has decided against amlodipine due to the potential for bradycardia worsening and also for the potential of worsening swelling in her extremities.  10/19/19 Since the patient's last visit, I have increased her hydrocodone to 10 mg, 120 tablets a month.  She seems to be doing better on this.  She would like to continue this dose.  There is no evidence of abuse or diversion.  She denies any chest pain.  She denies any shortness of breath.  She denies any dyspnea on exertion.  She denies any palpitations or syncope or near syncope.  She denies any hypoglycemia.  She is not fasting today however her last  cholesterol when checked in January was outstanding.  She is due to recheck her hemoglobin A1c today.  She has had her Covid vaccination.  She also recently had a [employer] screening which showed no evidence of carotid artery stenosis.  Showed no evidence of peripheral vascular disease.  Past Medical History:  Diagnosis Date  . Anticoagulant long-term use    eliquis  . Chronic sinusitis   . GERD (gastroesophageal reflux disease)   . Hemorrhoids   . History of colitis 10/2016   campylobacter  . Hyperlipidemia   . Hypertension   . Multinodular goiter    per pt has had 2 biopsy's both benign  . NSTEMI (non-ST elevated myocardial infarction) ([place])    12/2018- normal coronary arteries on cath, poss coronary vasospasm.   . Osteoarthritis    "all over"  and AC joint left shoulder  . Paroxysmal atrial fibrillation (HCC)  EP cardiologist--  [name]   s/p PVI by [name] 07/16/2013;   pt first dx 2008,  recurrence 2014 afib/flutter and tachy-brady  . Rotator cuff tear, left   . Shoulder impingement, left   . Status post placement of implantable loop recorder    original placement 07-26-2014;  removal / replacement 10-17-2017  by dr allred  . Type 2 diabetes mellitus ([place])    followed by pcp   Past Surgical History:  Procedure Laterality Date  . ATRIAL FIBRILLATION ABLATION N/A 07/16/2013   PVI and CTI ablation by [name]  . CARDIAC CATHETERIZATION     01/11/2019- Normal coronary arteries.   . COLONOSCOPY  09/10/2011   Dr. Arnoldo Morale: normal, 10 year follow up.  Marland Kitchen LEFT HEART CATH AND CORONARY ANGIOGRAPHY N/A 01/11/2019   Procedure: LEFT HEART CATH AND CORONARY ANGIOGRAPHY;  Surgeon: Lorretta Harp, MD;  Location: [facility];  Service: Cardiovascular;  Laterality: N/A;  . LESION REMOVAL N/A 05/03/2013   Procedure: EXCISION CYST, BACK;  Surgeon: Jamesetta So, MD;  Location: [facility];  Service: General;  Laterality: N/A;  . LOOP RECORDER IMPLANT N/A 07/26/2014   Procedure: LOOP RECORDER  IMPLANT;  Surgeon: Thompson Grayer, MD;  Location: [facility] CATH LAB;  Service: Cardiovascular;  Laterality: N/A;  . LOOP RECORDER INSERTION N/A 10/17/2017   MDT previously implanted ILR for afib management at [facility].  old device removed and new device placed by [name]  . LOOP RECORDER REMOVAL N/A 10/17/2017   MDT ILR removed with new device subsequently replaced  . SHOULDER ARTHROSCOPY WITH ROTATOR CUFF REPAIR AND SUBACROMIAL DECOMPRESSION Left 01/07/2019   Procedure: Left shoulder subacromial decompression, distal clavicle resection, extensive debridement,;  Surgeon: Nicholes Stairs, MD;  Location: [facility];  Service: Orthopedics;  Laterality: Left;  . TEE WITHOUT CARDIOVERSION N/A 07/15/2013   Procedure: TRANSESOPHAGEAL ECHOCARDIOGRAM (TEE);  Surgeon: Lelon Perla, MD;  Location: [facility] ENDOSCOPY;  Service: Cardiovascular;  Laterality: N/A;  . TONSILLECTOMY  age 84  . TRANSANAL HEMORRHOIDAL DEARTERIALIZATION N/A 09/22/2015   Procedure: TRANSANAL HEMORRHOIDAL LIGATION/PEXY EUA POSSIBLE HEMORRHOIDECTOMY ;  Surgeon: Michael Boston, MD;  Location: [facility];  Service: General;  Laterality: N/A;   Current Outpatient Medications on File Prior to Visit  Medication Sig Dispense Refill  . apixaban (ELIQUIS) 5 MG TABS tablet Take 1 tablet (5 mg total) by mouth 2 (two) times daily. 180 tablet 3  . esomeprazole (NEXIUM) 20 MG capsule Take 20 mg by mouth daily.     . hydrochlorothiazide 25 MG tablet Take 25 mg by mouth daily.     Marland Kitchen HYDROcodone-acetaminophen (NORCO) 7.5-325 MG tablet Take 1 tablet by mouth every 6 (six) hours as needed for moderate pain. 120 tablet 0  . lisinopril (ZESTRIL) 40 MG tablet Take 1 tablet (40 mg total) by mouth daily. 90 tablet 3  . metFORMIN (GLUCOPHAGE) 500 MG tablet Take 1 tablet (500 mg total) by mouth daily.    . metoprolol succinate (TOPROL-XL) 25 MG 24 hr tablet TAKE ONE-HALF TABLET BY MOUTH DAILY 45 tablet 3  . nitroGLYCERIN (NITROSTAT) 0.4 MG SL tablet PLACE 1  TABLET UNDER THE TONGUE EVERY 5 MINUTES AS NEEDED FOR CHEST PAIN 25 tablet 3  . ondansetron (ZOFRAN ODT) 4 MG disintegrating tablet Take 1 tablet (4 mg total) by mouth every 8 (eight) hours as needed for nausea or vomiting. 20 tablet 0  . Potassium Gluconate 550 MG TABS Take 550 mg by mouth at bedtime.     Marland Kitchen  promethazine (PHENERGAN) 12.5 MG tablet TAKE 1 TABLET(12.5 MG) BY MOUTH EVERY 8 HOURS AS NEEDED FOR NAUSEA OR VOMITING 30 tablet 0  . simvastatin (ZOCOR) 40 MG tablet Take 40 mg by mouth at bedtime.     . triamcinolone cream (KENALOG) 0.1 % Apply 1 application topically 2 (two) times daily. 45 g 0   No current facility-administered medications on file prior to visit.   Allergies  Allergen Reactions  . Clindamycin/Lincomycin Rash  . Doxycycline Other (See Comments)    Makes heart race  . Keflex [Cephalexin] Nausea And Vomiting  . Adhesive [Tape] Rash  . Latex Rash  . Sulfonamide Derivatives Nausea And Vomiting   Social History   Socioeconomic History  . Marital status: Married    Spouse name: Not on file  . Number of children: Not on file  . Years of education: Not on file  . Highest education level: Not on file  Occupational History  . Occupation: Pensions consultant: RETIRED    Comment: Full time  Tobacco Use  . Smoking status: Former Smoker    Years: 34.00    Types: Cigarettes    Quit date: 01/02/1994    Years since quitting: 25.8  . Smokeless tobacco: Never Used  Substance and Sexual Activity  . Alcohol use: No  . Drug use: No  . Sexual activity: Not on file  Other Topics Concern  . Not on file  Social History Narrative   Lives with spouse in [place]   Social Determinants of Health   Financial Resource Strain: Low Risk   . Difficulty of Paying Living Expenses: Not very hard  Food Insecurity: No Food Insecurity  . Worried About Charity fundraiser in the Last Year: Never true  . Ran Out of Food in the Last Year: Never true  Transportation Needs:  No Transportation Needs  . Lack of Transportation (Medical): No  . Lack of Transportation (Non-Medical): No  Physical Activity: Inactive  . Days of Exercise per Week: 0 days  . Minutes of Exercise per Session: 0 min  Stress: No Stress Concern Present  . Feeling of Stress : Only a little  Social Connections: Unknown  . Frequency of Communication with Friends and Family: Twice a week  . Frequency of Social Gatherings with Friends and Family: Never  . Attends Religious Services: Not on file  . Active Member of Clubs or Organizations: Not on file  . Attends Archivist Meetings: Not on file  . Marital Status: Not on file  Intimate Partner Violence: Not At Risk  . Fear of Current or Ex-Partner: No  . Emotionally Abused: No  . Physically Abused: No  . Sexually Abused: No     Review of Systems  All other systems reviewed and are negative.      Objective:   Physical Exam Vitals reviewed.  Constitutional:      General: She is not in acute distress.    Appearance: Normal appearance. She is not ill-appearing or toxic-appearing.  Eyes:     Extraocular Movements: Extraocular movements intact.     Conjunctiva/sclera: Conjunctivae normal.     Pupils: Pupils are equal, round, and reactive to light.  Cardiovascular:     Rate and Rhythm: Normal rate and regular rhythm.     Pulses: Normal pulses.     Heart sounds: Normal heart sounds. No murmur. No friction rub. No gallop.   Pulmonary:     Effort: Pulmonary effort is normal. No respiratory  distress.     Breath sounds: Normal breath sounds. No stridor. No wheezing, rhonchi or rales.  Abdominal:     General: Abdomen is flat. Bowel sounds are normal. There is no distension.     Palpations: Abdomen is soft.     Tenderness: There is no abdominal tenderness. There is no guarding.  Musculoskeletal:     Right shoulder: Tenderness present. Decreased range of motion.     Left shoulder: Decreased range of motion.     Cervical back:  Neck supple.     Lumbar back: Tenderness present. Decreased range of motion.     Right lower leg: Edema present.     Left lower leg: Edema present.  Skin:    General: Skin is warm.     Coloration: Skin is not jaundiced.     Findings: No bruising, erythema, lesion or rash.  Neurological:     General: No focal deficit present.     Mental Status: She is alert and oriented to person, place, and time.     Cranial Nerves: No cranial nerve deficit.     Sensory: No sensory deficit.     Motor: No weakness.     Coordination: Coordination normal.     Gait: Gait normal.     Deep Tendon Reflexes: Reflexes normal.  Psychiatric:        Mood and Affect: Mood normal.        Thought Content: Thought content normal.           Assessment & Plan:  Controlled type 2 diabetes mellitus with complication, without long-term current use of insulin (HCC) - Plan: CBC with Differential/Platelet, COMPLETE METABOLIC PANEL WITH GFR, Hemoglobin A1c  Paroxysmal atrial fibrillation (HCC)  NSTEMI (non-ST elevated myocardial infarction) ([place])  Essential hypertension, benign   Blood pressure today is outstanding.  Repeat a hemoglobin A1c.  Goal hemoglobin A1c is less than 6.5.  I would be willing to tolerate hemoglobin A1c up to 7.  LDL cholesterol is well below 70 at her last check.  Therefore I will not check a fasting lipid panel today.  She denies any symptoms of angina or shortness of breath or dyspnea on exertion.  Her pain is better managed since we increased her Norco to 10/325 4 times daily.  Therefore I will continue his current dose.  Monitor her liver and kidney test with a CMP.  She does have trace edema in the dorsums of both feet however this is not severe and does not require additional diuretics at the present time.  I did recommend a low-sodium diet.

## 2019-10-20 LAB — COMPLETE METABOLIC PANEL WITH GFR
AG Ratio: 1.9 (calc) (ref 1.0–2.5)
ALT: 10 U/L (ref 6–29)
AST: 18 U/L (ref 10–35)
Albumin: 4.2 g/dL (ref 3.6–5.1)
Alkaline phosphatase (APISO): 65 U/L (ref 37–153)
BUN: 8 mg/dL (ref 7–25)
CO2: 27 mmol/L (ref 20–32)
Calcium: 9.8 mg/dL (ref 8.6–10.4)
Chloride: 97 mmol/L — ABNORMAL LOW (ref 98–110)
Creat: 0.74 mg/dL (ref 0.60–0.93)
GFR, Est African American: 92 mL/min/{1.73_m2} (ref >=60)
GFR, Est Non African American: 79 mL/min/{1.73_m2} (ref >=60)
Globulin: 2.2 g/dL (calc) (ref 1.9–3.7)
Glucose, Bld: 111 mg/dL — ABNORMAL HIGH (ref 65–99)
Potassium: 3.7 mmol/L (ref 3.5–5.3)
Sodium: 133 mmol/L — ABNORMAL LOW (ref 135–146)
Total Bilirubin: 0.6 mg/dL (ref 0.2–1.2)
Total Protein: 6.4 g/dL (ref 6.1–8.1)

## 2019-10-20 LAB — CBC WITH DIFFERENTIAL/PLATELET
Absolute Monocytes: 720 cells/uL (ref 200–950)
Basophils Absolute: 41 cells/uL (ref 0–200)
Basophils Relative: 0.7 %
Eosinophils Absolute: 212 cells/uL (ref 15–500)
Eosinophils Relative: 3.6 %
HCT: 39.8 % (ref 35.0–45.0)
Hemoglobin: 13.5 g/dL (ref 11.7–15.5)
Lymphs Abs: 2407 cells/uL (ref 850–3900)
MCH: 29.4 pg (ref 27.0–33.0)
MCHC: 33.9 g/dL (ref 32.0–36.0)
MCV: 86.7 fL (ref 80.0–100.0)
MPV: 10 fL (ref 7.5–12.5)
Monocytes Relative: 12.2 %
Neutro Abs: 2519 cells/uL (ref 1500–7800)
Neutrophils Relative %: 42.7 %
Platelets: 210 10*3/uL (ref 140–400)
RBC: 4.59 10*6/uL (ref 3.80–5.10)
RDW: 12.6 % (ref 11.0–15.0)
Total Lymphocyte: 40.8 %
WBC: 5.9 10*3/uL (ref 3.8–10.8)

## 2019-10-20 LAB — HEMOGLOBIN A1C
Hgb A1c MFr Bld: 6.3 % of total Hgb — ABNORMAL HIGH (ref <5.7)
Mean Plasma Glucose: 134 (calc)
eAG (mmol/L): 7.4 (calc)

## 2019-10-26 ENCOUNTER — Other Ambulatory Visit: Payer: Self-pay | Admitting: Family Medicine

## 2019-10-26 MED ORDER — HYDROCODONE-ACETAMINOPHEN 10-325 MG PO TABS: 1.0000 | ORAL_TABLET | Freq: Four times a day (QID) | ORAL | 0 refills | Status: DC | PRN

## 2019-10-28 ENCOUNTER — Telehealth: Payer: Self-pay | Admitting: Family Medicine

## 2019-10-28 NOTE — Chronic Care Management (AMB) (Signed)
  Chronic Care Management   Note  10/28/2019 Name: Erika Lee MRN: [id number] DOB: [birth year]  Erika Lee is a 74 y.o. year old female who is a primary care patient of Dennard Schaumann, Cammie Mcgee, MD. I reached out to Erika Lee by phone today in response to a referral sent by Ms. Reece Levy Durante's PCP, [name], MD.   Ms. Weyl was given information about Chronic Care Management services today including:  1. CCM service includes personalized support from designated clinical staff supervised by her physician, including individualized plan of care and coordination with other care providers 2. 24/7 contact phone numbers for assistance for urgent and routine care needs. 3. Service will only be billed when office clinical staff spend 20 minutes or more in a month to coordinate care. 4. Only one practitioner may furnish and bill the service in a calendar month. 5. The patient may stop CCM services at any time (effective at the end of the month) by phone call to the office staff.   Patient agreed to services and verbal consent obtained.   Follow up plan:   [place]

## 2019-10-30 ENCOUNTER — Other Ambulatory Visit: Payer: Self-pay | Admitting: Family Medicine

## 2019-11-07 LAB — CUP PACEART REMOTE DEVICE CHECK
Date Time Interrogation Session: 20210509065225
Implantable Pulse Generator Implant Date: 20190419

## 2019-11-08 ENCOUNTER — Other Ambulatory Visit: Payer: Self-pay | Admitting: Family Medicine

## 2019-11-08 DIAGNOSIS — I10 Essential (primary) hypertension: Secondary | ICD-10-CM

## 2019-11-08 DIAGNOSIS — I480 Paroxysmal atrial fibrillation: Secondary | ICD-10-CM

## 2019-11-08 DIAGNOSIS — E118 Type 2 diabetes mellitus with unspecified complications: Secondary | ICD-10-CM

## 2019-11-09 ENCOUNTER — Ambulatory Visit (INDEPENDENT_AMBULATORY_CARE_PROVIDER_SITE_OTHER): Payer: PPO | Admitting: *Deleted

## 2019-11-09 DIAGNOSIS — I480 Paroxysmal atrial fibrillation: Secondary | ICD-10-CM | POA: Diagnosis not present

## 2019-11-09 NOTE — Chronic Care Management (AMB) (Addendum)
Chronic Care Management Pharmacy  Name: Erika Lee  MRN: [id number] DOB: April 03, 1946  Chief Complaint/ HPI  [name],  74 y.o. , female presents for their Initial CCM visit with the clinical pharmacist In office.  PCP : [name], MD  Their chronic conditions include: hypertension, GERD, Type II DM, hyperlipidemia, and Afib.  Office Visits:  10/19/2019 ([facility]) - goal A1c < 7.0, LDL < 70, pain is better managed with norco 10/325 qid dosing, had some edema in feet recommended low sodium rather than diuretics at this time.  07/20/2019 ([facility]) BP was elevated at this visit, patient had mild bradycardia but asymptomatic, if worsens would d/c metoprolol     Medications: Outpatient Encounter Medications as of 11/10/2019  Medication Sig   apixaban (ELIQUIS) 5 MG TABS tablet Take 1 tablet (5 mg total) by mouth 2 (two) times daily.   esomeprazole (NEXIUM) 20 MG capsule Take 20 mg by mouth daily.    hydrochlorothiazide 25 MG tablet Take 25 mg by mouth daily.    HYDROcodone-acetaminophen (NORCO) 10-325 MG tablet Take 1 tablet by mouth every 6 (six) hours as needed.   lisinopril (ZESTRIL) 40 MG tablet Take 1 tablet (40 mg total) by mouth daily.   metFORMIN (GLUCOPHAGE) 500 MG tablet Take 1 tablet (500 mg total) by mouth daily.   metoprolol succinate (TOPROL-XL) 25 MG 24 hr tablet TAKE ONE-HALF TABLET BY MOUTH DAILY   nitroGLYCERIN (NITROSTAT) 0.4 MG SL tablet PLACE 1 TABLET UNDER THE TONGUE EVERY 5 MINUTES AS NEEDED FOR CHEST PAIN   ondansetron (ZOFRAN ODT) 4 MG disintegrating tablet Take 1 tablet (4 mg total) by mouth every 8 (eight) hours as needed for nausea or vomiting.   Potassium Gluconate 550 MG TABS Take 550 mg by mouth at bedtime.    simvastatin (ZOCOR) 40 MG tablet Take 1 tablet (40 mg total) by mouth at bedtime.   promethazine (PHENERGAN) 12.5 MG tablet TAKE 1 TABLET(12.5 MG) BY MOUTH EVERY 8 HOURS AS NEEDED FOR NAUSEA OR VOMITING (Patient not taking: Reported on  11/10/2019)   triamcinolone cream (KENALOG) 0.1 % Apply 1 application topically 2 (two) times daily. (Patient not taking: Reported on 11/10/2019)   No facility-administered encounter medications on file as of 11/10/2019.     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 Lab Results  Component Value Date   HGBA1C 6.3 (H) 10/19/2019   Lab Results  Component Value Date   CREATININE 0.74 10/19/2019   CREATININE 0.75 07/21/2019   CREATININE 0.70 04/26/2019   Current antihyperglycemic regimen: metformin 500mg  daily Denies hypoglycemic symptoms, including dizziness, lightheadedness, shaking, sweating Denies hyperglycemic symptoms, including polyuria, polydipsia, polyphagia, nocturia, blurred vision, neuropathy Current exercise: minimal Current blood glucose readings: reported AM blood sugard of 99-125  Pharmacist Clinical Goal(s):  Over the next 90 days, patient will work with PharmD and primary care provider to optimize medication related to hyperglycemia.  Interventions: Comprehensive medication review performed, medication list updated in electronic medical record Inter-disciplinary care team collaboration (see longitudinal plan of care) Continue current therapy.  Patient Self Care Activities:  Patient will check blood glucose daily, document, and provide at future appointments Over the next 90 days: Patient will focus on medication adherence by pill box Patient will take medications as prescribed Patient will contact provider with any episodes of hypoglycemia Patient will report any questions or  concerns to provider   Initial goal documentation       High Blood Pressure - < 130/80       CARE PLAN ENTRY (see longitudinal plan of care for additional care plan information)  Current Barriers:  Uncontrolled hypertension, complicated by diabetes, hyperlipidemia, and afib. Current antihypertensive regimen: lisinopril 40mg  daily, HCTZ 25mg  daily, Toprol XL 25mg  one-half tablet daily Previous antihypertensives tried: none noted Last practice recorded BP readings:  BP Readings from Last 3 Encounters:  10/19/19 130/76  07/20/19 (!) 170/80  04/26/19 128/66   Current home BP readings: 140-150/80s  Pharmacist Clinical Goal(s):  Over the next 90 days, patient will work with PharmD and providers to optimize antihypertensive regimen  Interventions: Inter-disciplinary care team collaboration (see longitudinal plan of care) Comprehensive medication review performed; medication list updated in the electronic medical record.  Consult with cardiologist on addition of amlodipine to better control BP.  Patient Self Care Activities:  Patient will continue to check BP twice daily , document, and provide at future appointments Patient will focus on medication adherence by using a pill box. Over the next 90 days patient will follow up with cardiologist on addition of amlodipine to medication for BP.  Discuss at follow up visit with them in June.  Initial goal documentation      High Cholesterol - LDL < 70       CARE PLAN ENTRY (see longitudinal plan of care for additional care plan information)  Current Barriers:  Uncontrolled hyperlipidemia, complicated by hypertension, diabetes. Current antihyperlipidemic regimen: simvastatin 40mg  daily Previous antihyperlipidemic medications tried none noted Most recent lipid panel:     Component Value Date/Time   CHOL 146 07/21/2019 1055   TRIG 123 07/21/2019 1055   HDL 50 07/21/2019 1055   CHOLHDL 2.9 07/21/2019 1055   VLDL 16 01/10/2019 0308     LDLCALC 76 07/21/2019 1055    Pharmacist Clinical Goal(s):  Over the next 90 days, patient will work with PharmD and providers towards optimized antihyperlipidemic therapy  Interventions: Comprehensive medication review performed; medication list updated in electronic medical record.  Inter-disciplinary care team collaboration (see longitudinal plan of care) Continue current therapy. Diet modifications including sweets at night.  Patient Self Care Activities:  Patient will focus on medication adherence by using a pill box. Over the next 90 days patient will focus on her night time snacks.  She will try to limit ice cream consumption to only a few nights per week.  Initial goal documentation         AFIB   Patient is currently rate controlled.  Patient has failed these meds in past: none noted Patient is currently controlled on the following medications: metoprolol succinate 25mg . eliqius 5mg  twice daily   She denies dizziness with metoprolol, or any signs of abnormal bleeding with Eliquis.  Reports compliance with medications.  Plan  Continue current medications  ,  Diabetes   Recent Relevant Labs: Lab Results  Component Value Date/Time   HGBA1C 6.3 (H) 10/19/2019 03:02 PM   HGBA1C 6.6 (H) 07/21/2019 10:55 AM   MICROALBUR 0.5 07/21/2019 10:55 AM     Checking BG: Daily in the morning  Recent FBG Readings: 99-125  AM  Patient has failed these meds in past: none noted Patient is currently controlled on the following medications: metformin 500mg  daily  Last diabetic Foot exam: No results found for: HMDIABEYEEXA  Last diabetic Eye exam: No results found for: HMDIABFOOTEX   Patient said her weakness is ice cream.  Encouraged her that eating ice cream is ok just have in moderation.  Obviously not over doing it based on A1c reading at goal.  Denies hypoglycemia symptoms and tolerating metformin fine.  Plan  Continue current medications. Hypertension     Office blood pressures are  BP Readings from Last 3 Encounters:  10/19/19 130/76  07/20/19 (!) 170/80  04/26/19 128/66     Patient has failed these meds in the past: none noted  Patient checks BP at home twice daily and records in log, she did not have log with her at today's appointment.  Patient home BP readings are ranging: 140-150/80s  Patient is currently uncontrolled on the following medications: lisinopril 40mg  daily, HCTZ 25mg  daily   Patient will continue to monitor blood pressure twice daily and record in log.  Has follow up appointment with cardiology in June and will discuss additional meds at that time.  Per a note she brought in from [name] will discuss addition of amlodipine with cardiologist to get BP under control.  Plan  Continue current medications.  Continue to monitor BP twice daily and report consistently elevated BP > 140/90 to PharmD or PCP.  Consult with cardiologist about addition of amlodipine at next visit.  Will check back in with her in 3 months to assess progress and next steps.      Hyperlipidemia   Lipid Panel     Component Value Date/Time   CHOL 146 07/21/2019 1055   TRIG 123 07/21/2019 1055   HDL 50 07/21/2019 1055   CHOLHDL 2.9 07/21/2019 1055   VLDL 16 01/10/2019 0308   LDLCALC 76 07/21/2019 1055     The ASCVD Risk score (Goff DC Jr., et al., 2013) failed to calculate for the following reasons:   The patient has a prior MI or stroke diagnosis   Patient has failed these meds in past: none noted Patient is currently controlled on the following medications: simvastatin 40mg  daily  Tolerates medication well, taking at appropriate time of day.    Plan  Continue current medications  GERD    Patient has failed these meds in past: none noted Patient is currently controlled on the following medications: esomeprazole 20mg  daily  Patient has been taking this forever and has terrible symptoms when she does not take it.  Takes  correctly on a daily basis with not interest in tapering off at this time.  Plan  Continue current medications    Medication Management   Miscellaneous medications: ondansetron ODT 4 mg daily, promethazine 12.5mg  daily, norco 10-325mg  daily OTC's: potassium gluconate 550mg  daily Patient currently uses [employer] [facility].  Phone #  [phone] Patient reports using pill box method to organize medications and promote adherence. Patient denies missed doses of medication.   Beverly Milch, PharmD Clinical Pharmacist [place] [phone]  I have collaborated with the care management provider regarding care management and care coordination activities outlined in this encounter and have reviewed this encounter including documentation in the note and care plan. I am certifying that I agree with the content of this note and encounter as supervising physician.

## 2019-11-10 ENCOUNTER — Ambulatory Visit: Payer: PPO

## 2019-11-10 ENCOUNTER — Ambulatory Visit: Payer: PPO | Admitting: Pharmacist

## 2019-11-10 DIAGNOSIS — E785 Hyperlipidemia, unspecified: Secondary | ICD-10-CM

## 2019-11-10 DIAGNOSIS — E119 Type 2 diabetes mellitus without complications: Secondary | ICD-10-CM

## 2019-11-10 DIAGNOSIS — I10 Essential (primary) hypertension: Secondary | ICD-10-CM

## 2019-11-10 NOTE — Patient Instructions (Addendum)
Thank you for meeting with me today!  I look forward to working with you to help you meet all of your healthcare goals and answer any questions you may have.  Feel free to contact me anytime!   Visit Information  Goals Addressed            This Visit's Progress   . Diabetes - A1c < 7       CARE PLAN ENTRY (see longitudinal plan of care for additional care plan information)  Current Barriers:  . Diabetes: controlled; complicated by chronic medical conditions including hypertension and hyperlipidemia. Lab Results  Component Value Date   HGBA1C 6.3 (H) 10/19/2019 .   Lab Results  Component Value Date   CREATININE 0.74 10/19/2019   CREATININE 0.75 07/21/2019   CREATININE 0.70 04/26/2019   . Current antihyperglycemic regimen: metformin 500mg  daily . Denies hypoglycemic symptoms, including dizziness, lightheadedness, shaking, sweating . Denies hyperglycemic symptoms, including polyuria, polydipsia, polyphagia, nocturia, blurred vision, neuropathy . Current exercise: minimal . Current blood glucose readings: reported AM blood sugard of 99-125  Pharmacist Clinical Goal(s):  Marland Kitchen Over the next 90 days, patient will work with PharmD and primary care provider to optimize medication related to hyperglycemia.  Interventions: . Comprehensive medication review performed, medication list updated in electronic medical record . Inter-disciplinary care team collaboration (see longitudinal plan of care) . Continue current therapy.  Patient Self Care Activities:  . Patient will check blood glucose daily, document, and provide at future appointments . Over the next 90 days: . Patient will focus on medication adherence by pill box . Patient will take medications as prescribed . Patient will contact provider with any episodes of hypoglycemia . Patient will report any questions or concerns to provider   Initial goal documentation      . High Blood Pressure - < 130/80       CARE PLAN  ENTRY (see longitudinal plan of care for additional care plan information)  Current Barriers:  . Uncontrolled hypertension, complicated by diabetes, hyperlipidemia, and afib. . Current antihypertensive regimen: lisinopril 40mg  daily, HCTZ 25mg  daily, Toprol XL 25mg  one-half tablet daily . Previous antihypertensives tried: none noted . Last practice recorded BP readings:  BP Readings from Last 3 Encounters:  10/19/19 130/76  07/20/19 (!) 170/80  04/26/19 128/66   . Current home BP readings: 140-150/80s  Pharmacist Clinical Goal(s):  Marland Kitchen Over the next 90 days, patient will work with PharmD and providers to optimize antihypertensive regimen  Interventions: . Inter-disciplinary care team collaboration (see longitudinal plan of care) . Comprehensive medication review performed; medication list updated in the electronic medical record.  . Consult with cardiologist on addition of amlodipine to better control BP.  Patient Self Care Activities:  . Patient will continue to check BP twice daily , document, and provide at future appointments . Patient will focus on medication adherence by using a pill box. . Over the next 90 days patient will follow up with cardiologist on addition of amlodipine to medication for BP.  Discuss at follow up visit with them in June.  Initial goal documentation     . High Cholesterol - LDL < 70       CARE PLAN ENTRY (see longitudinal plan of care for additional care plan information)  Current Barriers:  . Uncontrolled hyperlipidemia, complicated by hypertension, diabetes. . Current antihyperlipidemic regimen: simvastatin 40mg  daily . Previous antihyperlipidemic medications tried none noted . Most recent lipid panel:     Component Value Date/Time   CHOL  146 07/21/2019 1055   TRIG 123 07/21/2019 1055   HDL 50 07/21/2019 1055   CHOLHDL 2.9 07/21/2019 1055   VLDL 16 01/10/2019 0308   LDLCALC 76 07/21/2019 1055    Pharmacist Clinical Goal(s):  Marland Kitchen Over the  next 90 days, patient will work with PharmD and providers towards optimized antihyperlipidemic therapy  Interventions: . Comprehensive medication review performed; medication list updated in electronic medical record.  Bertram Savin care team collaboration (see longitudinal plan of care) . Continue current therapy. . Diet modifications including sweets at night.  Patient Self Care Activities:  . Patient will focus on medication adherence by using a pill box. . Over the next 90 days patient will focus on her night time snacks.  She will try to limit ice cream consumption to only a few nights per week.  Initial goal documentation        Ms. Blume was given information about Chronic Care Management services today including:  1. CCM service includes personalized support from designated clinical staff supervised by her physician, including individualized plan of care and coordination with other care providers 2. 24/7 contact phone numbers for assistance for urgent and routine care needs. 3. Standard insurance, coinsurance, copays and deductibles apply for chronic care management only during months in which we provide at least 20 minutes of these services. Most insurances cover these services at 100%, however patients may be responsible for any copay, coinsurance and/or deductible if applicable. This service may help you avoid the need for more expensive face-to-face services. 4. Only one practitioner may furnish and bill the service in a calendar month. 5. The patient may stop CCM services at any time (effective at the end of the month) by phone call to the office staff.  Patient agreed to services and verbal consent obtained.   The patient verbalized understanding of instructions provided today and agreed to receive a mailed copy of patient instruction and/or educational materials. Telephone follow up appointment with pharmacy team member scheduled for: 02/10/20 @ 9:00 AM Beverly Milch, PharmD Clinical Pharmacist [place] [phone]   DASH Eating Plan DASH stands for "Dietary Approaches to Stop Hypertension." The DASH eating plan is a healthy eating plan that has been shown to reduce high blood pressure (hypertension). It may also reduce your risk for type 2 diabetes, heart disease, and stroke. The DASH eating plan may also help with weight loss. What are tips for following this plan?  General guidelines  Avoid eating more than 2,300 mg (milligrams) of salt (sodium) a day. If you have hypertension, you may need to reduce your sodium intake to 1,500 mg a day.  Limit alcohol intake to no more than 1 drink a day for nonpregnant women and 2 drinks a day for men. One drink equals 12 oz of beer, 5 oz of wine, or 1 oz of hard liquor.  Work with your health care provider to maintain a healthy body weight or to lose weight. Ask what an ideal weight is for you.  Get at least 30 minutes of exercise that causes your heart to beat faster (aerobic exercise) most days of the week. Activities may include walking, swimming, or biking.  Work with your health care provider or diet and nutrition specialist (dietitian) to adjust your eating plan to your individual calorie needs. Reading food labels   Check food labels for the amount of sodium per serving. Choose foods with less than 5 percent of the Daily Value of sodium. Generally,  foods with less than 300 mg of sodium per serving fit into this eating plan.  To find whole grains, look for the word "whole" as the first word in the ingredient list. Shopping  Buy products labeled as "low-sodium" or "no salt added."  Buy fresh foods. Avoid canned foods and premade or frozen meals. Cooking  Avoid adding salt when cooking. Use salt-free seasonings or herbs instead of table salt or sea salt. Check with your health care provider or pharmacist before using salt substitutes.  Do not fry foods. Cook foods using  healthy methods such as baking, boiling, grilling, and broiling instead.  Cook with heart-healthy oils, such as olive, canola, soybean, or sunflower oil. Meal planning  Eat a balanced diet that includes: ? 5 or more servings of fruits and vegetables each day. At each meal, try to fill half of your plate with fruits and vegetables. ? Up to 6-8 servings of whole grains each day. ? Less than 6 oz of lean meat, poultry, or fish each day. A 3-oz serving of meat is about the same size as a deck of cards. One egg equals 1 oz. ? 2 servings of low-fat dairy each day. ? A serving of nuts, seeds, or beans 5 times each week. ? Heart-healthy fats. Healthy fats called Omega-3 fatty acids are found in foods such as flaxseeds and coldwater fish, like sardines, salmon, and mackerel.  Limit how much you eat of the following: ? Canned or prepackaged foods. ? Food that is high in trans fat, such as fried foods. ? Food that is high in saturated fat, such as fatty meat. ? Sweets, desserts, sugary drinks, and other foods with added sugar. ? Full-fat dairy products.  Do not salt foods before eating.  Try to eat at least 2 vegetarian meals each week.  Eat more home-cooked food and less restaurant, buffet, and fast food.  When eating at a restaurant, ask that your food be prepared with less salt or no salt, if possible. What foods are recommended? The items listed may not be a complete list. Talk with your dietitian about what dietary choices are best for you. Grains Whole-grain or whole-wheat bread. Whole-grain or whole-wheat pasta. Brown rice. [place]. Bulgur. Whole-grain and low-sodium cereals. Pita bread. Low-fat, low-sodium crackers. Whole-wheat flour tortillas. Vegetables Fresh or frozen vegetables (raw, steamed, roasted, or grilled). Low-sodium or reduced-sodium tomato and vegetable juice. Low-sodium or reduced-sodium tomato sauce and tomato paste. Low-sodium or reduced-sodium canned  vegetables. Fruits All fresh, dried, or frozen fruit. Canned fruit in natural juice (without added sugar). Meat and other protein foods Skinless chicken or [place]. Ground chicken or [place]. Pork with fat trimmed off. Fish and seafood. Egg whites. Dried beans, peas, or lentils. Unsalted nuts, nut butters, and seeds. Unsalted canned beans. Lean cuts of beef with fat trimmed off. Low-sodium, lean deli meat. Dairy Low-fat (1%) or fat-free (skim) milk. Fat-free, low-fat, or reduced-fat cheeses. Nonfat, low-sodium ricotta or cottage cheese. Low-fat or nonfat yogurt. Low-fat, low-sodium cheese. Fats and oils Soft margarine without trans fats. Vegetable oil. Low-fat, reduced-fat, or light mayonnaise and salad dressings (reduced-sodium). Canola, safflower, olive, soybean, and sunflower oils. Avocado. Seasoning and other foods Herbs. Spices. Seasoning mixes without salt. Unsalted popcorn and pretzels. Fat-free sweets. What foods are not recommended? The items listed may not be a complete list. Talk with your dietitian about what dietary choices are best for you. Grains Baked goods made with fat, such as croissants, muffins, or some breads. Dry pasta or rice  meal packs. Vegetables Creamed or fried vegetables. Vegetables in a cheese sauce. Regular canned vegetables (not low-sodium or reduced-sodium). Regular canned tomato sauce and paste (not low-sodium or reduced-sodium). Regular tomato and vegetable juice (not low-sodium or reduced-sodium). Angie Fava. Olives. Fruits Canned fruit in a light or heavy syrup. Fried fruit. Fruit in cream or butter sauce. Meat and other protein foods Fatty cuts of meat. Ribs. Fried meat. Berniece Salines. Sausage. Bologna and other processed lunch meats. Salami. Fatback. Hotdogs. Bratwurst. Salted nuts and seeds. Canned beans with added salt. Canned or smoked fish. Whole eggs or egg yolks. Chicken or [place] with skin. Dairy Whole or 2% milk, cream, and half-and-half. Whole or full-fat  cream cheese. Whole-fat or sweetened yogurt. Full-fat cheese. Nondairy creamers. Whipped toppings. Processed cheese and cheese spreads. Fats and oils Butter. Stick margarine. Lard. Shortening. Ghee. Bacon fat. Tropical oils, such as coconut, palm kernel, or palm oil. Seasoning and other foods Salted popcorn and pretzels. Onion salt, garlic salt, seasoned salt, table salt, and sea salt. Worcestershire sauce. Tartar sauce. Barbecue sauce. Teriyaki sauce. Soy sauce, including reduced-sodium. Steak sauce. Canned and packaged gravies. Fish sauce. Oyster sauce. Cocktail sauce. Horseradish that you find on the shelf. Ketchup. Mustard. Meat flavorings and tenderizers. Bouillon cubes. Hot sauce and Tabasco sauce. Premade or packaged marinades. Premade or packaged taco seasonings. Relishes. Regular salad dressings. Where to find more information:  National Heart, Lung, and [place]: https://wilson-eaton.com/  American Heart Association: www.heart.org Summary  The DASH eating plan is a healthy eating plan that has been shown to reduce high blood pressure (hypertension). It may also reduce your risk for type 2 diabetes, heart disease, and stroke.  With the DASH eating plan, you should limit salt (sodium) intake to 2,300 mg a day. If you have hypertension, you may need to reduce your sodium intake to 1,500 mg a day.  When on the DASH eating plan, aim to eat more fresh fruits and vegetables, whole grains, lean proteins, low-fat dairy, and heart-healthy fats.  Work with your health care provider or diet and nutrition specialist (dietitian) to adjust your eating plan to your individual calorie needs. This information is not intended to replace advice given to you by your health care provider. Make sure you discuss any questions you have with your health care provider. Document Revised: 05/30/2017 Document Reviewed: 06/10/2016 Elsevier Patient Education  2020 [employer].

## 2019-11-10 NOTE — Progress Notes (Signed)
Carelink Summary Report / Loop Recorder 

## 2019-11-12 ENCOUNTER — Encounter: Payer: Self-pay | Admitting: Family Medicine

## 2019-11-12 ENCOUNTER — Other Ambulatory Visit: Payer: Self-pay

## 2019-11-12 ENCOUNTER — Ambulatory Visit (INDEPENDENT_AMBULATORY_CARE_PROVIDER_SITE_OTHER): Payer: PPO | Admitting: Family Medicine

## 2019-11-12 VITALS — BP 130/68 | HR 52 | Temp 97.6°F | Resp 16 | Ht 65.0 in | Wt 160.0 lb

## 2019-11-12 DIAGNOSIS — M79674 Pain in right toe(s): Secondary | ICD-10-CM | POA: Diagnosis not present

## 2019-11-12 MED ORDER — AMOXICILLIN-POT CLAVULANATE 875-125 MG PO TABS
1.0000 | ORAL_TABLET | Freq: Two times a day (BID) | ORAL | 0 refills | Status: DC
Start: 2019-11-12 — End: 2020-01-06

## 2019-11-12 NOTE — Progress Notes (Signed)
Subjective:    Patient ID: Erika Lee, female    DOB: [birth year], 74 y.o.   MRN: [id number]  HPI  Starting Tuesday of this week, the patient developed pain in the distal portion of her right second toe.  She describes it as a burning electrical-like pain with gentle touch.  At the end of the toe due to a hammertoe, there is a pressure callus.  There is dried blood around the pressure callus extending up onto the toenail.  There is some redness and warmth in that area and tenderness to palpation.  The remainder of her foot exam is normal    Past Medical History:  Diagnosis Date  . Anticoagulant long-term use    eliquis  . Chronic sinusitis   . GERD (gastroesophageal reflux disease)   . Hemorrhoids   . History of colitis 10/2016   campylobacter  . Hyperlipidemia   . Hypertension   . Multinodular goiter    per pt has had 2 biopsy's both benign  . NSTEMI (non-ST elevated myocardial infarction) ([place])    12/2018- normal coronary arteries on cath, poss coronary vasospasm.   . Osteoarthritis    "all over"  and AC joint left shoulder  . Paroxysmal atrial fibrillation [facility] [place]) EP cardiologist--  [name]   s/p PVI by [name] 07/16/2013;   pt first dx 2008,  recurrence 2014 afib/flutter and tachy-brady  . Rotator cuff tear, left   . Shoulder impingement, left   . Status post placement of implantable loop recorder    original placement 07-26-2014;  removal / replacement 10-17-2017  by dr allred  . Type 2 diabetes mellitus ([place])    followed by pcp   Past Surgical History:  Procedure Laterality Date  . ATRIAL FIBRILLATION ABLATION N/A 07/16/2013   PVI and CTI ablation by [name]  . CARDIAC CATHETERIZATION     01/11/2019- Normal coronary arteries.   . COLONOSCOPY  09/10/2011   Dr. Arnoldo Morale: normal, 10 year follow up.  Marland Kitchen LEFT HEART CATH AND CORONARY ANGIOGRAPHY N/A 01/11/2019   Procedure: LEFT HEART CATH AND CORONARY ANGIOGRAPHY;  Surgeon: Lorretta Harp, MD;  Location: [place] [facility];  Service: Cardiovascular;  Laterality: N/A;  . LESION REMOVAL N/A 05/03/2013   Procedure: EXCISION CYST, BACK;  Surgeon: Jamesetta So, MD;  Location: [facility];  Service: General;  Laterality: N/A;  . LOOP RECORDER IMPLANT N/A 07/26/2014   Procedure: LOOP RECORDER IMPLANT;  Surgeon: Thompson Grayer, MD;  Location: [facility] CATH LAB;  Service: Cardiovascular;  Laterality: N/A;  . LOOP RECORDER INSERTION N/A 10/17/2017   MDT previously implanted ILR for afib management at [facility].  old device removed and new device placed by [name]  . LOOP RECORDER REMOVAL N/A 10/17/2017   MDT ILR removed with new device subsequently replaced  . SHOULDER ARTHROSCOPY WITH ROTATOR CUFF REPAIR AND SUBACROMIAL DECOMPRESSION Left 01/07/2019   Procedure: Left shoulder subacromial decompression, distal clavicle resection, extensive debridement,;  Surgeon: Nicholes Stairs, MD;  Location: [facility];  Service: Orthopedics;  Laterality: Left;  . TEE WITHOUT CARDIOVERSION N/A 07/15/2013   Procedure: TRANSESOPHAGEAL ECHOCARDIOGRAM (TEE);  Surgeon: Lelon Perla, MD;  Location: [facility] ENDOSCOPY;  Service: Cardiovascular;  Laterality: N/A;  . TONSILLECTOMY  age 8  . TRANSANAL HEMORRHOIDAL DEARTERIALIZATION N/A 09/22/2015   Procedure: TRANSANAL HEMORRHOIDAL LIGATION/PEXY EUA POSSIBLE HEMORRHOIDECTOMY ;  Surgeon: Michael Boston, MD;  Location: [facility];  Service: General;  Laterality: N/A;   Current Outpatient Medications on File  Prior to Visit  Medication Sig Dispense Refill  . apixaban (ELIQUIS) 5 MG TABS tablet Take 1 tablet (5 mg total) by mouth 2 (two) times daily. 180 tablet 3  . esomeprazole (NEXIUM) 20 MG capsule Take 20 mg by mouth daily.     . hydrochlorothiazide 25 MG tablet Take 25 mg by mouth daily.     Marland Kitchen HYDROcodone-acetaminophen (NORCO) 10-325 MG tablet Take 1 tablet by mouth every 6 (six) hours as needed. 120 tablet 0  . lisinopril (ZESTRIL) 40 MG tablet Take 1 tablet (40 mg total) by mouth daily. 90 tablet  3  . metFORMIN (GLUCOPHAGE) 500 MG tablet Take 1 tablet (500 mg total) by mouth daily.    . metoprolol succinate (TOPROL-XL) 25 MG 24 hr tablet TAKE ONE-HALF TABLET BY MOUTH DAILY 45 tablet 3  . nitroGLYCERIN (NITROSTAT) 0.4 MG SL tablet PLACE 1 TABLET UNDER THE TONGUE EVERY 5 MINUTES AS NEEDED FOR CHEST PAIN 25 tablet 3  . ondansetron (ZOFRAN ODT) 4 MG disintegrating tablet Take 1 tablet (4 mg total) by mouth every 8 (eight) hours as needed for nausea or vomiting. 20 tablet 3  . Potassium Gluconate 550 MG TABS Take 550 mg by mouth at bedtime.     . simvastatin (ZOCOR) 40 MG tablet Take 1 tablet (40 mg total) by mouth at bedtime. 90 tablet 1  . promethazine (PHENERGAN) 12.5 MG tablet TAKE 1 TABLET(12.5 MG) BY MOUTH EVERY 8 HOURS AS NEEDED FOR NAUSEA OR VOMITING (Patient not taking: Reported on 11/10/2019) 30 tablet 0  . triamcinolone cream (KENALOG) 0.1 % Apply 1 application topically 2 (two) times daily. (Patient not taking: Reported on 11/10/2019) 45 g 0   No current facility-administered medications on file prior to visit.   Allergies  Allergen Reactions  . Clindamycin/Lincomycin Rash  . Doxycycline Other (See Comments)    Makes heart race  . Keflex [Cephalexin] Nausea And Vomiting  . Adhesive [Tape] Rash  . Latex Rash  . Sulfonamide Derivatives Nausea And Vomiting   Social History   Socioeconomic History  . Marital status: Married    Spouse name: Not on file  . Number of children: Not on file  . Years of education: Not on file  . Highest education level: Not on file  Occupational History  . Occupation: Pensions consultant: RETIRED    Comment: Full time  Tobacco Use  . Smoking status: Former Smoker    Years: 34.00    Types: Cigarettes    Quit date: 01/02/1994    Years since quitting: 25.8  . Smokeless tobacco: Never Used  Substance and Sexual Activity  . Alcohol use: No  . Drug use: No  . Sexual activity: Not on file  Other Topics Concern  . Not on file  Social  History Narrative   Lives with spouse in [place]   Social Determinants of Health   Financial Resource Strain: Low Risk   . Difficulty of Paying Living Expenses: Not very hard  Food Insecurity: No Food Insecurity  . Worried About Charity fundraiser in the Last Year: Never true  . Ran Out of Food in the Last Year: Never true  Transportation Needs: No Transportation Needs  . Lack of Transportation (Medical): No  . Lack of Transportation (Non-Medical): No  Physical Activity: Inactive  . Days of Exercise per Week: 0 days  . Minutes of Exercise per Session: 0 min  Stress: No Stress Concern Present  . Feeling of Stress : Only a  little  Social Connections: Unknown  . Frequency of Communication with Friends and Family: Twice a week  . Frequency of Social Gatherings with Friends and Family: Never  . Attends Religious Services: Not on file  . Active Member of Clubs or Organizations: Not on file  . Attends Archivist Meetings: Not on file  . Marital Status: Not on file  Intimate Partner Violence: Not At Risk  . Fear of Current or Ex-Partner: No  . Emotionally Abused: No  . Physically Abused: No  . Sexually Abused: No     Review of Systems  All other systems reviewed and are negative.      Objective:   Physical Exam Vitals reviewed. Exam conducted with a chaperone present.  Constitutional:      Appearance: Normal appearance. She is normal weight.  Cardiovascular:     Rate and Rhythm: Normal rate.  Pulmonary:     Effort: Pulmonary effort is normal.     Breath sounds: Normal breath sounds.  Musculoskeletal:       Feet:  Neurological:     Mental Status: She is alert.           Assessment & Plan:  Toe pain, right - Plan: DG Toe 2nd Right  Given the dry blood, I am concerned that there has been trauma that has occurred to the toe so I have recommended an x-ray to evaluate for fracture.  There also appears to be drainage coming from underneath the toenail  adjacent to the preulcerative callus on the plantar aspect of the toe.  They are from concerned that there is a secondary infection developing in a diabetic patient.  In addition to the x-ray I will start the patient on Augmentin 875 mg twice daily and then reassess next week.

## 2019-11-19 ENCOUNTER — Other Ambulatory Visit: Payer: Self-pay | Admitting: Family Medicine

## 2019-11-19 ENCOUNTER — Other Ambulatory Visit: Payer: Self-pay | Admitting: *Deleted

## 2019-11-19 MED ORDER — HYDROCODONE-ACETAMINOPHEN 10-325 MG PO TABS: 1.0000 | ORAL_TABLET | Freq: Four times a day (QID) | ORAL | 0 refills | Status: AC | PRN

## 2019-11-19 MED ORDER — HYDROCODONE-ACETAMINOPHEN 10-325 MG PO TABS: 1.0000 | ORAL_TABLET | Freq: Four times a day (QID) | ORAL | 0 refills | Status: DC | PRN

## 2019-11-19 MED ORDER — AMLODIPINE BESYLATE 10 MG PO TABS
10.0000 mg | ORAL_TABLET | Freq: Every day | ORAL | 3 refills | Status: DC
Start: 2019-11-19 — End: 2019-12-13

## 2019-11-19 NOTE — Telephone Encounter (Signed)
Ok to refill??  Last office visit 11/19/2019.  Last refill 10/26/2019.

## 2019-11-20 ENCOUNTER — Other Ambulatory Visit: Payer: Self-pay | Admitting: Family Medicine

## 2019-11-25 ENCOUNTER — Other Ambulatory Visit: Payer: Self-pay

## 2019-11-25 ENCOUNTER — Ambulatory Visit (INDEPENDENT_AMBULATORY_CARE_PROVIDER_SITE_OTHER): Payer: PPO | Admitting: Family Medicine

## 2019-11-25 DIAGNOSIS — L84 Corns and callosities: Secondary | ICD-10-CM | POA: Diagnosis not present

## 2019-11-25 DIAGNOSIS — M2041 Other hammer toe(s) (acquired), right foot: Secondary | ICD-10-CM | POA: Diagnosis not present

## 2019-11-25 NOTE — Progress Notes (Signed)
Subjective:    Patient ID: Erika Lee, female    DOB: August 07, 1945, 74 y.o.   MRN: [id number]  HPI  Patient is a 74 year old female who presents today with pain in her right second toe.  Patient has a hammertoe in this area.  This causes the toe to bear the brunt of the pressure every time she walks.  Her medial portion of her toenail is ingrowing underneath a dried bloody scab.  There is a preulcerative callus forming on the plantar surface of the distal toe from the repeated pressure.  This area is extremely painful. Past Medical History:  Diagnosis Date  . Anticoagulant long-term use    eliquis  . Chronic sinusitis   . GERD (gastroesophageal reflux disease)   . Hemorrhoids   . History of colitis 10/2016   campylobacter  . Hyperlipidemia   . Hypertension   . Multinodular goiter    per pt has had 2 biopsy's both benign  . NSTEMI (non-ST elevated myocardial infarction) ([place])    12/2018- normal coronary arteries on cath, poss coronary vasospasm.   . Osteoarthritis    "all over"  and AC joint left shoulder  . Paroxysmal atrial fibrillation [facility]) EP cardiologist--  [name]   s/p PVI by [name] 07/16/2013;   pt first dx 2008,  recurrence 2014 afib/flutter and tachy-brady  . Rotator cuff tear, left   . Shoulder impingement, left   . Status post placement of implantable loop recorder    original placement 07-26-2014;  removal / replacement 10-17-2017  by dr allred  . Type 2 diabetes mellitus ([place])    followed by pcp   Past Surgical History:  Procedure Laterality Date  . ATRIAL FIBRILLATION ABLATION N/A 07/16/2013   PVI and CTI ablation by [name]  . CARDIAC CATHETERIZATION     01/11/2019- Normal coronary arteries.   . COLONOSCOPY  09/10/2011   Dr. Arnoldo Morale: normal, 10 year follow up.  Marland Kitchen LEFT HEART CATH AND CORONARY ANGIOGRAPHY N/A 01/11/2019   Procedure: LEFT HEART CATH AND CORONARY ANGIOGRAPHY;  Surgeon: Lorretta Harp, MD;  Location: [place] [facility];  Service:  Cardiovascular;  Laterality: N/A;  . LESION REMOVAL N/A 05/03/2013   Procedure: EXCISION CYST, BACK;  Surgeon: Jamesetta So, MD;  Location: [facility];  Service: General;  Laterality: N/A;  . LOOP RECORDER IMPLANT N/A 07/26/2014   Procedure: LOOP RECORDER IMPLANT;  Surgeon: Thompson Grayer, MD;  Location: [facility] CATH LAB;  Service: Cardiovascular;  Laterality: N/A;  . LOOP RECORDER INSERTION N/A 10/17/2017   MDT previously implanted ILR for afib management at [facility].  old device removed and new device placed by [name]  . LOOP RECORDER REMOVAL N/A 10/17/2017   MDT ILR removed with new device subsequently replaced  . SHOULDER ARTHROSCOPY WITH ROTATOR CUFF REPAIR AND SUBACROMIAL DECOMPRESSION Left 01/07/2019   Procedure: Left shoulder subacromial decompression, distal clavicle resection, extensive debridement,;  Surgeon: Nicholes Stairs, MD;  [facility]  Service: Orthopedics;  Laterality: Left;  . TEE WITHOUT CARDIOVERSION N/A 07/15/2013   Procedure: TRANSESOPHAGEAL ECHOCARDIOGRAM (TEE);  Surgeon: Lelon Perla, MD;  Location: [facility] ENDOSCOPY;  Service: Cardiovascular;  Laterality: N/A;  . TONSILLECTOMY  age 33  . TRANSANAL HEMORRHOIDAL DEARTERIALIZATION N/A 09/22/2015   Procedure: TRANSANAL HEMORRHOIDAL LIGATION/PEXY EUA POSSIBLE HEMORRHOIDECTOMY ;  Surgeon: Michael Boston, MD;  Location[facility];  Service: General;  Laterality: N/A;   Current Outpatient Medications on File Prior to Visit  Medication Sig Dispense Refill  .  amLODipine (NORVASC) 10 MG tablet Take 1 tablet (10 mg total) by mouth daily. 90 tablet 3  . amoxicillin-clavulanate (AUGMENTIN) 875-125 MG tablet Take 1 tablet by mouth 2 (two) times daily. 20 tablet 0  . apixaban (ELIQUIS) 5 MG TABS tablet Take 1 tablet (5 mg total) by mouth 2 (two) times daily. 180 tablet 3  . esomeprazole (NEXIUM) 20 MG capsule Take 20 mg by mouth daily.     . hydrochlorothiazide 25 MG tablet Take 25 mg by mouth daily.     Marland Kitchen  HYDROcodone-acetaminophen (NORCO) 10-325 MG tablet Take 1 tablet by mouth every 6 (six) hours as needed. 120 tablet 0  . lisinopril (ZESTRIL) 40 MG tablet Take 1 tablet (40 mg total) by mouth daily. 90 tablet 3  . metFORMIN (GLUCOPHAGE) 500 MG tablet Take 1 tablet (500 mg total) by mouth daily.    . metoprolol succinate (TOPROL-XL) 25 MG 24 hr tablet TAKE ONE-HALF TABLET BY MOUTH DAILY 45 tablet 3  . nitroGLYCERIN (NITROSTAT) 0.4 MG SL tablet PLACE 1 TABLET UNDER THE TONGUE EVERY 5 MINUTES AS NEEDED FOR CHEST PAIN 25 tablet 3  . ondansetron (ZOFRAN ODT) 4 MG disintegrating tablet Take 1 tablet (4 mg total) by mouth every 8 (eight) hours as needed for nausea or vomiting. 20 tablet 3  . Potassium Gluconate 550 MG TABS Take 550 mg by mouth at bedtime.     . promethazine (PHENERGAN) 12.5 MG tablet TAKE 1 TABLET(12.5 MG) BY MOUTH EVERY 8 HOURS AS NEEDED FOR NAUSEA OR VOMITING 30 tablet 0  . simvastatin (ZOCOR) 40 MG tablet Take 1 tablet (40 mg total) by mouth at bedtime. 90 tablet 1  . triamcinolone cream (KENALOG) 0.1 % Apply 1 application topically 2 (two) times daily. (Patient not taking: Reported on 11/10/2019) 45 g 0   No current facility-administered medications on file prior to visit.   Allergies  Allergen Reactions  . Clindamycin/Lincomycin Rash  . Doxycycline Other (See Comments)    Makes heart race  . Keflex [Cephalexin] Nausea And Vomiting  . Adhesive [Tape] Rash  . Latex Rash  . Sulfonamide Derivatives Nausea And Vomiting   Social History   Socioeconomic History  . Marital status: Married    Spouse name: Not on file  . Number of children: Not on file  . Years of education: Not on file  . Highest education level: Not on file  Occupational History  . Occupation: Pensions consultant: RETIRED    Comment: Full time  Tobacco Use  . Smoking status: Former Smoker    Years: 34.00    Types: Cigarettes    Quit date: 01/02/1994    Years since quitting: 25.9  . Smokeless  tobacco: Never Used  Substance and Sexual Activity  . Alcohol use: No  . Drug use: No  . Sexual activity: Not on file  Other Topics Concern  . Not on file  Social History Narrative   Lives with spouse in [place]   Social Determinants of Health   Financial Resource Strain: Low Risk   . Difficulty of Paying Living Expenses: Not very hard  Food Insecurity: No Food Insecurity  . Worried About Charity fundraiser in the Last Year: Never true  . Ran Out of Food in the Last Year: Never true  Transportation Needs: No Transportation Needs  . Lack of Transportation (Medical): No  . Lack of Transportation (Non-Medical): No  Physical Activity: Inactive  . Days of Exercise per Week: 0 days  .  Minutes of Exercise per Session: 0 min  Stress: No Stress Concern Present  . Feeling of Stress : Only a little  Social Connections: Unknown  . Frequency of Communication with Friends and Family: Twice a week  . Frequency of Social Gatherings with Friends and Family: Never  . Attends Religious Services: Not on file  . Active Member of Clubs or Organizations: Not on file  . Attends Archivist Meetings: Not on file  . Marital Status: Not on file  Intimate Partner Violence: Not At Risk  . Fear of Current or Ex-Partner: No  . Emotionally Abused: No  . Physically Abused: No  . Sexually Abused: No     Review of Systems  All other systems reviewed and are negative.      Objective:   Physical Exam Vitals reviewed. Exam conducted with a chaperone present.  Constitutional:      Appearance: Normal appearance. She is normal weight.  Cardiovascular:     Rate and Rhythm: Normal rate.  Pulmonary:     Effort: Pulmonary effort is normal.     Breath sounds: Normal breath sounds.  Neurological:     Mental Status: She is alert.           Assessment & Plan:  Hammertoe of right foot - Plan: Ambulatory referral to Podiatry  Pre-ulcerative calluses - Plan: Ambulatory referral to  Podiatry  Patient would benefit from a referral to podiatry to correct the hammertoe which is leading to the preulcerative callus.  The pain on the medial distal toenail and the adjacent skin is due to an ingrown toenail constant application pressure due to the deformity of her foot.  This would benefit from surgical correction as well.

## 2019-11-26 ENCOUNTER — Ambulatory Visit: Payer: Self-pay | Admitting: Pharmacist

## 2019-11-26 NOTE — Chronic Care Management (AMB) (Addendum)
  Chronic Care Management   Follow Up Note   11/26/2019 Name: Erika Lee MRN: [id number] DOB: 10-26-1945  Referred by: [name], MD Reason for referral : No chief complaint on file.   Erika Lee is a 74 y.o. year old female who is a primary care patient of Pickard, Cammie Mcgee, MD. The CCM team was consulted for assistance with chronic disease management and care coordination needs.    Review of patient status, including review of consultants reports, relevant laboratory and other test results, and collaboration with appropriate care team members and the patient's provider was performed as part of comprehensive patient evaluation and provision of chronic care management services.    SDOH (Social Determinants of Health) assessments performed: No See Care Plan activities for detailed interventions related to [facility])      Outpatient Encounter Medications as of 11/26/2019  Medication Sig   amLODipine (NORVASC) 10 MG tablet Take 1 tablet (10 mg total) by mouth daily.   amoxicillin-clavulanate (AUGMENTIN) 875-125 MG tablet Take 1 tablet by mouth 2 (two) times daily.   apixaban (ELIQUIS) 5 MG TABS tablet Take 1 tablet (5 mg total) by mouth 2 (two) times daily.   esomeprazole (NEXIUM) 20 MG capsule Take 20 mg by mouth daily.    hydrochlorothiazide 25 MG tablet Take 25 mg by mouth daily.    HYDROcodone-acetaminophen (NORCO) 10-325 MG tablet Take 1 tablet by mouth every 6 (six) hours as needed.   lisinopril (ZESTRIL) 40 MG tablet Take 1 tablet (40 mg total) by mouth daily.   metFORMIN (GLUCOPHAGE) 500 MG tablet Take 1 tablet (500 mg total) by mouth daily.   metoprolol succinate (TOPROL-XL) 25 MG 24 hr tablet TAKE ONE-HALF TABLET BY MOUTH DAILY   nitroGLYCERIN (NITROSTAT) 0.4 MG SL tablet PLACE 1 TABLET UNDER THE TONGUE EVERY 5 MINUTES AS NEEDED FOR CHEST PAIN   ondansetron (ZOFRAN ODT) 4 MG disintegrating tablet Take 1 tablet (4 mg total) by mouth every 8 (eight) hours as needed for  nausea or vomiting.   Potassium Gluconate 550 MG TABS Take 550 mg by mouth at bedtime.    promethazine (PHENERGAN) 12.5 MG tablet TAKE 1 TABLET(12.5 MG) BY MOUTH EVERY 8 HOURS AS NEEDED FOR NAUSEA OR VOMITING   simvastatin (ZOCOR) 40 MG tablet Take 1 tablet (40 mg total) by mouth at bedtime.   triamcinolone cream (KENALOG) 0.1 % Apply 1 application topically 2 (two) times daily. (Patient not taking: Reported on 11/10/2019)   No facility-administered encounter medications on file as of 11/26/2019.     Objective:   Reviewed dispense report to evaluate adherence and care gaps.  Plan:   Adherent based on fill history no care gaps noted at this time.   Beverly Milch, PharmD Clinical Pharmacist [place] [phone]  I have collaborated with the care management provider regarding care management and care coordination activities outlined in this encounter and have reviewed this encounter including documentation in the note and care plan. I am certifying that I agree with the content of this note and encounter as supervising physician.

## 2019-12-10 ENCOUNTER — Ambulatory Visit: Payer: PPO | Admitting: Podiatry

## 2019-12-10 ENCOUNTER — Telehealth: Payer: Self-pay

## 2019-12-10 NOTE — Telephone Encounter (Signed)
Spoke with pt regarding appt. Pt stated she has been having some swelling in her feet every since she has started Rx Amplodipine prescribed by her medical doctor. Pt was advise to discuss this with Dr. Rayann Heman. Pt stated she did not have any questions regarding the appt and confirmed her virtual visit.

## 2019-12-10 NOTE — Telephone Encounter (Signed)
Left a message regarding virtual appt on 12/13/19.

## 2019-12-10 NOTE — Telephone Encounter (Signed)
Follow up   Transferred call from patient to April B.

## 2019-12-11 ENCOUNTER — Other Ambulatory Visit: Payer: Self-pay | Admitting: Family Medicine

## 2019-12-13 ENCOUNTER — Telehealth: Payer: Self-pay

## 2019-12-13 ENCOUNTER — Ambulatory Visit (INDEPENDENT_AMBULATORY_CARE_PROVIDER_SITE_OTHER): Payer: PPO | Admitting: *Deleted

## 2019-12-13 ENCOUNTER — Telehealth: Payer: Self-pay | Admitting: Family Medicine

## 2019-12-13 ENCOUNTER — Telehealth (INDEPENDENT_AMBULATORY_CARE_PROVIDER_SITE_OTHER): Payer: PPO | Admitting: Internal Medicine

## 2019-12-13 VITALS — BP 132/75

## 2019-12-13 DIAGNOSIS — R609 Edema, unspecified: Secondary | ICD-10-CM

## 2019-12-13 DIAGNOSIS — I480 Paroxysmal atrial fibrillation: Secondary | ICD-10-CM | POA: Diagnosis not present

## 2019-12-13 DIAGNOSIS — I119 Hypertensive heart disease without heart failure: Secondary | ICD-10-CM | POA: Diagnosis not present

## 2019-12-13 DIAGNOSIS — D6869 Other thrombophilia: Secondary | ICD-10-CM | POA: Diagnosis not present

## 2019-12-13 LAB — CUP PACEART REMOTE DEVICE CHECK
Date Time Interrogation Session: 20210613235225
Implantable Pulse Generator Implant Date: 20190419

## 2019-12-13 MED ORDER — AMLODIPINE BESYLATE 5 MG PO TABS: 5.0000 mg | ORAL_TABLET | Freq: Every day | ORAL | 3 refills | Status: DC

## 2019-12-13 MED ORDER — METOPROLOL SUCCINATE ER 25 MG PO TB24: 12.5000 mg | ORAL_TABLET | Freq: Every day | ORAL | 3 refills | Status: DC

## 2019-12-13 MED ORDER — SPIRONOLACTONE 25 MG PO TABS
25.0000 mg | ORAL_TABLET | Freq: Every day | ORAL | 3 refills | Status: DC
Start: 2019-12-13 — End: 2020-08-16

## 2019-12-13 MED ORDER — METFORMIN HCL 500 MG PO TABS: 500.0000 mg | ORAL_TABLET | Freq: Every day | ORAL | Status: DC

## 2019-12-13 MED ORDER — PROMETHAZINE HCL 12.5 MG PO TABS: ORAL_TABLET | ORAL | 0 refills | Status: DC

## 2019-12-13 NOTE — Telephone Encounter (Signed)
Call placed to Pt.  Advised of medication changes.  Will call Dr. Samella Parr office to see if Pt can have BMP done at their office.  Left message to call back.

## 2019-12-13 NOTE — Progress Notes (Signed)
Carelink Summary Report / Loop Recorder 

## 2019-12-13 NOTE — Telephone Encounter (Signed)
Patient requesting a refill on her metformin called into [facility] on [address]and her phenergan  CB#[phone]

## 2019-12-13 NOTE — Telephone Encounter (Signed)
-----   Message from Thompson Grayer, MD sent at 12/13/2019  1:14 PM EDT ----- Needs refill on metoprolol.   Reduce amlodipine to 5mg  daily  Add spironolactone 25mg  daily  Needs bmet in 7-10 days

## 2019-12-13 NOTE — Telephone Encounter (Signed)
Rx refilled.

## 2019-12-13 NOTE — Progress Notes (Signed)
Electrophysiology TeleHealth Note   Due to national recommendations of social distancing due to COVID 19, an audio/video telehealth visit is felt to be most appropriate for this patient at this time.  See MyChart message from today for the patient's consent to telehealth for [facility].  Date:  12/13/2019   ID:  [name], DOB 28-Aug-1945, MRN [id number]  Location: patient's home  Provider location:  [place]  Evaluation Performed: Follow-up visit  PCP:  [name], MD   Electrophysiologist:  [name]  Chief Complaint:  palpitations  History of Present Illness:    DANAHI REDDISH is a 74 y.o. female who presents via telehealth conferencing today.  Since last being seen in our clinic, the patient reports doing very well.  Her primary concern is with elevated BP.  She has started amlodipine but has noticed edema in her feet sine.  Today, she denies symptoms of palpitations, chest pain, shortness of breath,  lower extremity edema, dizziness, presyncope, or syncope.  The patient is otherwise without complaint today.   Past Medical History:  Diagnosis Date   Anticoagulant long-term use    eliquis   Chronic sinusitis    GERD (gastroesophageal reflux disease)    Hemorrhoids    History of colitis 10/2016   campylobacter   Hyperlipidemia    Hypertension    Multinodular goiter    per pt has had 2 biopsy's both benign   NSTEMI (non-ST elevated myocardial infarction) ([place])    12/2018- normal coronary arteries on cath, poss coronary vasospasm.    Osteoarthritis    "all over"  and [facility] joint left shoulder   Paroxysmal atrial fibrillation [facility]) EP cardiologist--  [name]   s/p PVI by [name] 07/16/2013;   pt first dx 2008,  recurrence 2014 afib/flutter and tachy-brady   Rotator cuff tear, left    Shoulder impingement, left    Status post placement of implantable loop recorder    original placement 07-26-2014;  removal / replacement 10-17-2017  by dr     Type 2 diabetes mellitus ([place])    followed by pcp    Past Surgical History:  Procedure Laterality Date   ATRIAL FIBRILLATION ABLATION N/A 07/16/2013   PVI and CTI ablation by [name]   CARDIAC CATHETERIZATION     01/11/2019- Normal coronary arteries.    COLONOSCOPY  09/10/2011   Dr. Arnoldo Morale: normal, 10 year follow up.   LEFT HEART CATH AND CORONARY ANGIOGRAPHY N/A 01/11/2019   Procedure: LEFT HEART CATH AND CORONARY ANGIOGRAPHY;  Surgeon: Lorretta Harp, MD;  Location: [place] CV LAB;  Service: Cardiovascular;  Laterality: N/A;   LESION REMOVAL N/A 05/03/2013   Procedure: EXCISION CYST, BACK;  Surgeon: Jamesetta So, MD;  Location: [facility];  Service: General;  Laterality: N/A;   LOOP RECORDER IMPLANT N/A 07/26/2014   Procedure: LOOP RECORDER IMPLANT;  Surgeon: Thompson Grayer, MD;  Location: [facility] CATH LAB;  Service: Cardiovascular;  Laterality: N/A;   LOOP RECORDER INSERTION N/A 10/17/2017   MDT previously implanted ILR for afib management at [facility].  old device removed and new device placed by [name]   LOOP RECORDER REMOVAL N/A 10/17/2017   MDT ILR removed with new device subsequently replaced   SHOULDER ARTHROSCOPY WITH ROTATOR CUFF REPAIR AND SUBACROMIAL DECOMPRESSION Left 01/07/2019   Procedure: Left shoulder subacromial decompression, distal clavicle resection, extensive debridement,;  Surgeon: Nicholes Stairs, MD;  Location: [facility];  Service: Orthopedics;  Laterality:  Left;   TEE WITHOUT CARDIOVERSION N/A 07/15/2013   Procedure: TRANSESOPHAGEAL ECHOCARDIOGRAM (TEE);  Surgeon: Lelon Perla, MD;  Location: [facility] ENDOSCOPY;  Service: Cardiovascular;  Laterality: N/A;   TONSILLECTOMY  age 42   TRANSANAL HEMORRHOIDAL DEARTERIALIZATION N/A 09/22/2015   Procedure: TRANSANAL HEMORRHOIDAL LIGATION/PEXY EUA POSSIBLE HEMORRHOIDECTOMY ;  Surgeon: Michael Boston, MD;  Location: [facility];  Service: General;  Laterality: N/A;    Current Outpatient  Medications  Medication Sig Dispense Refill   amLODipine (NORVASC) 10 MG tablet Take 1 tablet (10 mg total) by mouth daily. 90 tablet 3   amoxicillin-clavulanate (AUGMENTIN) 875-125 MG tablet Take 1 tablet by mouth 2 (two) times daily. 20 tablet 0   apixaban (ELIQUIS) 5 MG TABS tablet Take 1 tablet (5 mg total) by mouth 2 (two) times daily. 180 tablet 3   esomeprazole (NEXIUM) 20 MG capsule Take 20 mg by mouth daily.      hydrochlorothiazide 25 MG tablet Take 25 mg by mouth daily.      HYDROcodone-acetaminophen (NORCO) 10-325 MG tablet Take 1 tablet by mouth every 6 (six) hours as needed. 120 tablet 0   lisinopril (ZESTRIL) 40 MG tablet Take 1 tablet (40 mg total) by mouth daily. 90 tablet 3   metFORMIN (GLUCOPHAGE) 500 MG tablet Take 1 tablet (500 mg total) by mouth daily.     metoprolol succinate (TOPROL-XL) 25 MG 24 hr tablet TAKE ONE-HALF TABLET BY MOUTH DAILY 45 tablet 3   nitroGLYCERIN (NITROSTAT) 0.4 MG SL tablet PLACE 1 TABLET UNDER THE TONGUE EVERY 5 MINUTES AS NEEDED FOR CHEST PAIN 25 tablet 3   ondansetron (ZOFRAN ODT) 4 MG disintegrating tablet Take 1 tablet (4 mg total) by mouth every 8 (eight) hours as needed for nausea or vomiting. 20 tablet 3   Potassium Gluconate 550 MG TABS Take 550 mg by mouth at bedtime.      promethazine (PHENERGAN) 12.5 MG tablet TAKE 1 TABLET(12.5 MG) BY MOUTH EVERY 8 HOURS AS NEEDED FOR NAUSEA OR VOMITING 30 tablet 0   simvastatin (ZOCOR) 40 MG tablet Take 1 tablet (40 mg total) by mouth at bedtime. 90 tablet 1   triamcinolone cream (KENALOG) 0.1 % Apply 1 application topically 2 (two) times daily. 45 g 0   No current facility-administered medications for this visit.    Allergies:   Clindamycin/lincomycin, Doxycycline, Keflex [cephalexin], Adhesive [tape], Latex, and Sulfonamide derivatives   Social History:  The patient  reports that she quit smoking about 25 years ago. Her smoking use included cigarettes. She quit after 34.00 years of  use. She has never used smokeless tobacco. She reports that she does not drink alcohol and does not use drugs.   ROS:  Please see the history of present illness.   All other systems are personally reviewed and negative.    Exam:    Vital Signs:  BP 132/75    LMP  (LMP Unknown)   Well sounding and appearing, alert and conversant, regular work of breathing,  good skin color Eyes- anicteric, neuro- grossly intact, skin- no apparent rash or lesions or cyanosis, mouth- oral mucosa is pink  Labs/Other Tests and Data Reviewed:    Recent Labs: 10/19/2019: ALT 10; BUN 8; Creat 0.74; Hemoglobin 13.5; Platelets 210; Potassium 3.7; Sodium 133   Wt Readings from Last 3 Encounters:  11/12/19 160 lb (72.6 kg)  10/19/19 162 lb (73.5 kg)  07/20/19 164 lb (74.4 kg)     Last device remote is reviewed from [place] PDF which reveals normal  device function, no arrhythmias    ASSESSMENT & PLAN:    1.  Paroxysmal atrial fibrillation Well controlled No episodes by ILR Continue eliquis for chads2vasc score of 5  2. Hypertensive cardiovascular disease She has had elevation in her BP.  Is not tolerating norvasc due to edema. Reduce norvasc to 5mg  daily Add spironolactone 25mg  daily bmet in 7-10 days Follow-up with PCP  3. HL Stable No change required today  Risks, benefits and potential toxicities for medications prescribed and/or refilled reviewed with patient today.   Follow-up:  With EP APP in the next several weeks for further BP management I will see in a year unless further problems arise   Patient Risk:  after full review of this patients clinical status, I feel that they are at moderate risk at this time.  Today, I have spent 15 minutes with the patient with telehealth technology discussing arrhythmia management .    Army Fossa, MD  12/13/2019 1:16 PM     [address] [place] [address] [phone] (office) [phone]  (fax)

## 2019-12-14 NOTE — Telephone Encounter (Signed)
Left message return call orders placed for BMET

## 2019-12-14 NOTE — Telephone Encounter (Signed)
Received a phone call from Dr. Thompson Caul office patient's cardiologist asking if we can order BMP here due to them making medications changes. Ok to place order for BMP?

## 2019-12-14 NOTE — Telephone Encounter (Signed)
sure

## 2019-12-21 ENCOUNTER — Other Ambulatory Visit: Payer: PPO

## 2019-12-21 ENCOUNTER — Other Ambulatory Visit: Payer: Self-pay

## 2019-12-21 ENCOUNTER — Telehealth: Payer: Self-pay | Admitting: Family Medicine

## 2019-12-21 DIAGNOSIS — I10 Essential (primary) hypertension: Secondary | ICD-10-CM

## 2019-12-21 DIAGNOSIS — Z79899 Other long term (current) drug therapy: Secondary | ICD-10-CM

## 2019-12-21 MED ORDER — METFORMIN HCL 500 MG PO TABS: 500.0000 mg | ORAL_TABLET | Freq: Every day | ORAL | 3 refills | Status: DC

## 2019-12-21 NOTE — Telephone Encounter (Signed)
CB# [phone] refill Metformin,

## 2019-12-21 NOTE — Telephone Encounter (Signed)
Pt refill sent with refills

## 2019-12-22 LAB — BASIC METABOLIC PANEL
BUN: 9 mg/dL (ref 7–25)
CO2: 29 mmol/L (ref 20–32)
Calcium: 10.2 mg/dL (ref 8.6–10.4)
Chloride: 96 mmol/L — ABNORMAL LOW (ref 98–110)
Creat: 0.76 mg/dL (ref 0.60–0.93)
Glucose, Bld: 128 mg/dL — ABNORMAL HIGH (ref 65–99)
Potassium: 4.3 mmol/L (ref 3.5–5.3)
Sodium: 133 mmol/L — ABNORMAL LOW (ref 135–146)

## 2019-12-23 ENCOUNTER — Ambulatory Visit: Payer: PPO | Admitting: Podiatry

## 2019-12-23 ENCOUNTER — Encounter: Payer: Self-pay | Admitting: Podiatry

## 2019-12-23 ENCOUNTER — Other Ambulatory Visit: Payer: Self-pay

## 2019-12-23 ENCOUNTER — Ambulatory Visit (INDEPENDENT_AMBULATORY_CARE_PROVIDER_SITE_OTHER): Payer: PPO

## 2019-12-23 DIAGNOSIS — B351 Tinea unguium: Secondary | ICD-10-CM | POA: Diagnosis not present

## 2019-12-23 DIAGNOSIS — Q828 Other specified congenital malformations of skin: Secondary | ICD-10-CM

## 2019-12-23 DIAGNOSIS — M2041 Other hammer toe(s) (acquired), right foot: Secondary | ICD-10-CM

## 2019-12-23 DIAGNOSIS — M79676 Pain in unspecified toe(s): Secondary | ICD-10-CM

## 2019-12-23 DIAGNOSIS — L84 Corns and callosities: Secondary | ICD-10-CM | POA: Diagnosis not present

## 2019-12-26 NOTE — Progress Notes (Signed)
Subjective:  Patient ID: Erika Lee, female    DOB: 1945-12-21,  MRN: [id number] HPI Chief Complaint  Patient presents with  . Toe Pain    2nd toe right - hammertoe deformity, callused on tip of toe, toenail is thick, wants it removed.  . New Patient (Initial Visit)    74 y.o. female presents with the above complaint.   ROS: Denies fever chills nausea vomiting muscle aches pains calf pain back pain chest pain shortness of breath.  Past Medical History:  Diagnosis Date  . Anticoagulant long-term use    eliquis  . Chronic sinusitis   . GERD (gastroesophageal reflux disease)   . Hemorrhoids   . History of colitis 10/2016   campylobacter  . Hyperlipidemia   . Hypertension   . Multinodular goiter    per pt has had 2 biopsy's both benign  . NSTEMI (non-ST elevated myocardial infarction) ([place])    12/2018- normal coronary arteries on cath, poss coronary vasospasm.   . Osteoarthritis    "all over"  and AC joint left shoulder  . Paroxysmal atrial fibrillation [facility]) EP cardiologist--  [name]   s/p PVI by [name] 07/16/2013;   pt first dx 2008,  recurrence 2014 afib/flutter and tachy-brady  . Rotator cuff tear, left   . Shoulder impingement, left   . Status post placement of implantable loop recorder    original placement 07-26-2014;  removal / replacement 10-17-2017  by dr allred  . Type 2 diabetes mellitus ([place])    followed by pcp   Past Surgical History:  Procedure Laterality Date  . ATRIAL FIBRILLATION ABLATION N/A 07/16/2013   PVI and CTI ablation by [name]  . CARDIAC CATHETERIZATION     01/11/2019- Normal coronary arteries.   . COLONOSCOPY  09/10/2011   Dr. Arnoldo Morale: normal, 10 year follow up.  Marland Kitchen LEFT HEART CATH AND CORONARY ANGIOGRAPHY N/A 01/11/2019   Procedure: LEFT HEART CATH AND CORONARY ANGIOGRAPHY;  Surgeon: Lorretta Harp, MD;  Location: [place] CV LAB;  Service: Cardiovascular;  Laterality: N/A;  . LESION REMOVAL N/A 05/03/2013   Procedure: EXCISION  CYST, BACK;  Surgeon: Jamesetta So, MD;  Location: [facility];  Service: General;  Laterality: N/A;  . LOOP RECORDER IMPLANT N/A 07/26/2014   Procedure: LOOP RECORDER IMPLANT;  Surgeon: Thompson Grayer, MD;  Location: [facility] CATH LAB;  Service: Cardiovascular;  Laterality: N/A;  . LOOP RECORDER INSERTION N/A 10/17/2017   MDT previously implanted ILR for afib management at [facility].  old device removed and new device placed by [name]  . LOOP RECORDER REMOVAL N/A 10/17/2017   MDT ILR removed with new device subsequently replaced  . SHOULDER ARTHROSCOPY WITH ROTATOR CUFF REPAIR AND SUBACROMIAL DECOMPRESSION Left 01/07/2019   Procedure: Left shoulder subacromial decompression, distal clavicle resection, extensive debridement,;  Surgeon: Nicholes Stairs, MD;  Location: [facility];  Service: Orthopedics;  Laterality: Left;  . TEE WITHOUT CARDIOVERSION N/A 07/15/2013   Procedure: TRANSESOPHAGEAL ECHOCARDIOGRAM (TEE);  Surgeon: Lelon Perla, MD;  Location: [facility] ENDOSCOPY;  Service: Cardiovascular;  Laterality: N/A;  . TONSILLECTOMY  age 22  . TRANSANAL HEMORRHOIDAL DEARTERIALIZATION N/A 09/22/2015   Procedure: TRANSANAL HEMORRHOIDAL LIGATION/PEXY EUA POSSIBLE HEMORRHOIDECTOMY ;  Surgeon: Michael Boston, MD;  Location: [facility];  Service: General;  Laterality: N/A;    Current Outpatient Medications:  .  amLODipine (NORVASC) 5 MG tablet, Take 1 tablet (5 mg total) by mouth daily., Disp: 90 tablet, Rfl: 3 .  amoxicillin-clavulanate (AUGMENTIN) 875-125  MG tablet, Take 1 tablet by mouth 2 (two) times daily., Disp: 20 tablet, Rfl: 0 .  apixaban (ELIQUIS) 5 MG TABS tablet, Take 1 tablet (5 mg total) by mouth 2 (two) times daily., Disp: 180 tablet, Rfl: 3 .  esomeprazole (NEXIUM) 20 MG capsule, Take 20 mg by mouth daily. , Disp: , Rfl:  .  hydrochlorothiazide 25 MG tablet, Take 25 mg by mouth daily. , Disp: , Rfl:  .  Lancets (FREESTYLE) lancets, 1 each daily., Disp: , Rfl:  .  lisinopril (ZESTRIL) 40 MG  tablet, Take 1 tablet (40 mg total) by mouth daily., Disp: 90 tablet, Rfl: 3 .  metFORMIN (GLUCOPHAGE) 500 MG tablet, Take 1 tablet (500 mg total) by mouth daily., Disp: , Rfl: 3 .  metoprolol succinate (TOPROL-XL) 25 MG 24 hr tablet, Take 0.5 tablets (12.5 mg total) by mouth daily., Disp: 45 tablet, Rfl: 3 .  nitroGLYCERIN (NITROSTAT) 0.4 MG SL tablet, PLACE 1 TABLET UNDER THE TONGUE EVERY 5 MINUTES AS NEEDED FOR CHEST PAIN, Disp: 25 tablet, Rfl: 3 .  ondansetron (ZOFRAN ODT) 4 MG disintegrating tablet, Take 1 tablet (4 mg total) by mouth every 8 (eight) hours as needed for nausea or vomiting., Disp: 20 tablet, Rfl: 3 .  Potassium Gluconate 550 MG TABS, Take 550 mg by mouth at bedtime. , Disp: , Rfl:  .  promethazine (PHENERGAN) 12.5 MG tablet, TAKE 1 TABLET(12.5 MG) BY MOUTH EVERY 8 HOURS AS NEEDED FOR NAUSEA OR VOMITING, Disp: 30 tablet, Rfl: 0 .  simvastatin (ZOCOR) 40 MG tablet, Take 1 tablet (40 mg total) by mouth at bedtime., Disp: 90 tablet, Rfl: 1 .  spironolactone (ALDACTONE) 25 MG tablet, Take 1 tablet (25 mg total) by mouth daily., Disp: 90 tablet, Rfl: 3 .  triamcinolone cream (KENALOG) 0.1 %, Apply 1 application topically 2 (two) times daily., Disp: 45 g, Rfl: 0  Allergies  Allergen Reactions  . Clindamycin/Lincomycin Rash  . Doxycycline Other (See Comments)    Makes heart race  . Keflex [Cephalexin] Nausea And Vomiting  . Adhesive [Tape] Rash  . Latex Rash  . Sulfonamide Derivatives Nausea And Vomiting   Review of Systems Objective:  There were no vitals filed for this visit.  General: Well developed, nourished, in no acute distress, alert and oriented x3   Dermatological: Skin is warm, dry and supple bilateral. Nails x 10 are well maintained; remaining integument appears unremarkable at this time. There are no open sores, no preulcerative lesions, no rash or signs of infection present.  Distal clavus to the second toe of the right foot secondary to hammertoe deformity.   Also resulting in a thicker toenail.  Vascular: Dorsalis Pedis artery and Posterior Tibial artery pedal pulses are 2/4 bilateral with immedate capillary fill time. Pedal hair growth present. No varicosities and no lower extremity edema present bilateral.   Neruologic: Grossly intact via light touch bilateral. Vibratory intact via tuning fork bilateral. Protective threshold with Semmes Wienstein monofilament intact to all pedal sites bilateral. Patellar and Achilles deep tendon reflexes 2+ bilateral. No Babinski or clonus noted bilateral.   Musculoskeletal: No gross boney pedal deformities bilateral. No pain, crepitus, or limitation noted with foot and ankle range of motion bilateral. Muscular strength 5/5 in all groups tested bilateral.  Hammertoe deformities bilateral.  Fairly flexible in nature particularly the second toe of the right foot consistent with the distal clavus due to contracture deformity.  Gait: Unassisted, Nonantalgic.    Radiographs:  Radiographs demonstrate considerable osteopenia bilateral.  She does have hammertoe deformities bilateral with mild bunion deformities.  No acute abnormalities.  Assessment & Plan:   Assessment: Contracted second toe of the right foot fairly flexible in nature distal clavus and a painful toenail.   Plan: We discussed etiology pathology conservative versus surgical therapies though at this point I did debride the reactive hyperkeratotic lesion and placed a silicone pad.  We did discuss getting in touch with Dr. Thompson Grayer to see about stopping her Eliquis for 2 to 3days prior to performing a flexor tenotomy and a nail removal.  Once we get consent from him we will go ahead and have that done.      T. [place], [place]

## 2019-12-27 ENCOUNTER — Encounter: Payer: PPO | Admitting: Internal Medicine

## 2019-12-27 ENCOUNTER — Other Ambulatory Visit: Payer: Self-pay | Admitting: Nurse Practitioner

## 2019-12-27 ENCOUNTER — Other Ambulatory Visit: Payer: Self-pay | Admitting: Family Medicine

## 2019-12-27 ENCOUNTER — Telehealth: Payer: Self-pay | Admitting: Family Medicine

## 2019-12-27 MED ORDER — METFORMIN HCL 500 MG PO TABS: 500.0000 mg | ORAL_TABLET | Freq: Every day | ORAL | 3 refills | Status: DC

## 2019-12-27 NOTE — Telephone Encounter (Signed)
Patient requesting a refill on her metformin and her pain medication. She uses [facility] [address] In [place].  CB# [phone]

## 2019-12-28 ENCOUNTER — Other Ambulatory Visit: Payer: Self-pay

## 2019-12-28 MED ORDER — METFORMIN HCL 500 MG PO TABS: 500.0000 mg | ORAL_TABLET | Freq: Every day | ORAL | 3 refills | Status: DC

## 2019-12-28 NOTE — Telephone Encounter (Signed)
Pt metformin refilled, Pt did not have any pain meds listed in medications.

## 2020-01-04 ENCOUNTER — Other Ambulatory Visit: Payer: Self-pay | Admitting: Family Medicine

## 2020-01-04 MED ORDER — HYDROCODONE-ACETAMINOPHEN 10-325 MG PO TABS: 1.0000 | ORAL_TABLET | Freq: Four times a day (QID) | ORAL | 0 refills | Status: AC | PRN

## 2020-01-04 MED ORDER — PROMETHAZINE HCL 12.5 MG PO TABS: ORAL_TABLET | ORAL | 0 refills | Status: DC

## 2020-01-04 MED ORDER — METFORMIN HCL 500 MG PO TABS: 500.0000 mg | ORAL_TABLET | Freq: Every day | ORAL | 3 refills | Status: DC

## 2020-01-05 NOTE — Progress Notes (Signed)
Cardiology Office Note Date:  01/05/2020  Patient ID:  Erika Lee July 15, 1945, MRN [id number] PCP:  [name], MD  Electrophysiologist:  Dr. Rayann Heman   Chief Complaint:  F/u on medicine changes  History of Present Illness: Erika Lee is a 74 y.o. female with history of DM, GERD, HTN, AFlutter/AFib (s/p CTI and PVI ablation 2015), July 2020 suffered NSTEMI > echo noted preserved LVEF with new WMA > cath with normal coronaries, possibly had spasm.  She comes in today to be seen for Dr. Rayann Heman, last seen by him 12/13/2019 via telehealth visit. She was concerned about her BP, had recently been started on amlodipine though developed some swelling with it.  The dose was reduced to 5mg  daily, spironolactone was added with plans to have f/u labs and to have further f/u with her PMD. No AF on loop transmissions.  She feels well, though despite reduction in amlodipine her LE swelling persists.  iT is all but gone in the AM and as the day goes builds up and is uncomfortable She is very active and denies any SOB, DOE or changes to her exertional capacity.  NO symptoms of PND or orthopnea No CP, palpitations She does not think she has had any AF No bleeding or signs of bleeding  Home BP's running 110-130/60-80, more often towards lower end of the range   She is seeing a podiatrist and will need a procedure to fix a tendon, not yet scheduled, they mentioned they would be sending details for a clearance.   Device information: MDT ILR implanted 10/17/17, AFib surveillance    Past Medical History:  Diagnosis Date  . Anticoagulant long-term use    eliquis  . Chronic sinusitis   . GERD (gastroesophageal reflux disease)   . Hemorrhoids   . History of colitis 10/2016   campylobacter  . Hyperlipidemia   . Hypertension   . Multinodular goiter    per pt has had 2 biopsy's both benign  . NSTEMI (non-ST elevated myocardial infarction) ([place])    12/2018- normal coronary arteries on cath,  poss coronary vasospasm.   . Osteoarthritis    "all over"  and AC joint left shoulder  . Paroxysmal atrial fibrillation [facility]) EP cardiologist--  [name]   s/p PVI by [name] 07/16/2013;   pt first dx 2008,  recurrence 2014 afib/flutter and tachy-brady  . Rotator cuff tear, left   . Shoulder impingement, left   . Status post placement of implantable loop recorder    original placement 07-26-2014;  removal / replacement 10-17-2017  by dr allred  . Type 2 diabetes mellitus ([place])    followed by pcp    Past Surgical History:  Procedure Laterality Date  . ATRIAL FIBRILLATION ABLATION N/A 07/16/2013   PVI and CTI ablation by [name]  . CARDIAC CATHETERIZATION     01/11/2019- Normal coronary arteries.   . COLONOSCOPY  09/10/2011   Dr. Arnoldo Morale: normal, 10 year follow up.  Marland Kitchen LEFT HEART CATH AND CORONARY ANGIOGRAPHY N/A 01/11/2019   Procedure: LEFT HEART CATH AND CORONARY ANGIOGRAPHY;  Surgeon: Lorretta Harp, MD;  Location: [place] [facility] CV LAB;  Service: Cardiovascular;  Laterality: N/A;  . LESION REMOVAL N/A 05/03/2013   Procedure: EXCISION CYST, BACK;  Surgeon: Jamesetta So, MD;  Location: [facility];  Service: General;  Laterality: N/A;  . LOOP RECORDER IMPLANT N/A 07/26/2014   Procedure: LOOP RECORDER IMPLANT;  Surgeon: Thompson Grayer, MD;  Location: [facility] CATH LAB;  Service: Cardiovascular;  Laterality: N/A;  . LOOP RECORDER INSERTION N/A 10/17/2017   MDT previously implanted ILR for afib management at [facility].  old device removed and new device placed by [name]  . LOOP RECORDER REMOVAL N/A 10/17/2017   MDT ILR removed with new device subsequently replaced  . SHOULDER ARTHROSCOPY WITH ROTATOR CUFF REPAIR AND SUBACROMIAL DECOMPRESSION Left 01/07/2019   Procedure: Left shoulder subacromial decompression, distal clavicle resection, extensive debridement,;  Surgeon: Nicholes Stairs, MD;  Location: [facility];  Service: Orthopedics;  Laterality: Left;  . TEE WITHOUT CARDIOVERSION  N/A 07/15/2013   Procedure: TRANSESOPHAGEAL ECHOCARDIOGRAM (TEE);  Surgeon: Lelon Perla, MD;  Location: [facility] ENDOSCOPY;  Service: Cardiovascular;  Laterality: N/A;  . TONSILLECTOMY  age 60  . TRANSANAL HEMORRHOIDAL DEARTERIALIZATION N/A 09/22/2015   Procedure: TRANSANAL HEMORRHOIDAL LIGATION/PEXY EUA POSSIBLE HEMORRHOIDECTOMY ;  Surgeon: Michael Boston, MD;  Location: [facility] ORS;  Service: General;  Laterality: N/A;    Current Outpatient Medications  Medication Sig Dispense Refill  . amLODipine (NORVASC) 5 MG tablet Take 1 tablet (5 mg total) by mouth daily. 90 tablet 3  . amoxicillin-clavulanate (AUGMENTIN) 875-125 MG tablet Take 1 tablet by mouth 2 (two) times daily. 20 tablet 0  . apixaban (ELIQUIS) 5 MG TABS tablet Take 1 tablet (5 mg total) by mouth 2 (two) times daily. 180 tablet 3  . esomeprazole (NEXIUM) 20 MG capsule Take 20 mg by mouth daily.     . hydrochlorothiazide 25 MG tablet Take 25 mg by mouth daily.     Marland Kitchen HYDROcodone-acetaminophen (NORCO) 10-325 MG tablet Take 1 tablet by mouth every 6 (six) hours as needed. 120 tablet 0  . Lancets (FREESTYLE) lancets 1 each daily.    Marland Kitchen lisinopril (ZESTRIL) 40 MG tablet Take 1 tablet (40 mg total) by mouth daily. 90 tablet 3  . metFORMIN (GLUCOPHAGE) 500 MG tablet Take 1 tablet (500 mg total) by mouth daily. 90 tablet 3  . metoprolol succinate (TOPROL-XL) 25 MG 24 hr tablet Take 0.5 tablets (12.5 mg total) by mouth daily. 45 tablet 3  . nitroGLYCERIN (NITROSTAT) 0.4 MG SL tablet PLACE 1 TABLET UNDER THE TONGUE EVERY 5 MINUTES AS NEEDED FOR CHEST PAIN 25 tablet 3  . ondansetron (ZOFRAN ODT) 4 MG disintegrating tablet Take 1 tablet (4 mg total) by mouth every 8 (eight) hours as needed for nausea or vomiting. 20 tablet 3  . Potassium Gluconate 550 MG TABS Take 550 mg by mouth at bedtime.     . promethazine (PHENERGAN) 12.5 MG tablet TAKE 1 TABLET(12.5 MG) BY MOUTH EVERY 8 HOURS AS NEEDED FOR NAUSEA OR VOMITING 30 tablet 0  . simvastatin (ZOCOR) 40 MG  tablet Take 1 tablet (40 mg total) by mouth at bedtime. 90 tablet 1  . spironolactone (ALDACTONE) 25 MG tablet Take 1 tablet (25 mg total) by mouth daily. 90 tablet 3  . triamcinolone cream (KENALOG) 0.1 % Apply 1 application topically 2 (two) times daily. 45 g 0   No current facility-administered medications for this visit.    Allergies:   Clindamycin/lincomycin, Doxycycline, Keflex [cephalexin], Adhesive [tape], Latex, and Sulfonamide derivatives   Social History:  The patient  reports that she quit smoking about 26 years ago. Her smoking use included cigarettes. She quit after 34.00 years of use. She has never used smokeless tobacco. She reports that she does not drink alcohol and does not use drugs.   Family History:  The patient's family history includes Kidney disease in her maternal grandfather;  Other in her maternal grandfather; Stroke in her maternal grandmother; Stroke (age of onset: 60) in her mother.  ROS:  Please see the history of present illness.    All other systems are reviewed and otherwise negative.   PHYSICAL EXAM:  VS:  LMP  (LMP Unknown)  BMI: There is no height or weight on file to calculate BMI. Well nourished, well developed, in no acute distress  HEENT: normocephalic, atraumatic  Neck: no JVD, carotid bruits or masses Cardiac: RRR; no significant murmurs, no rubs, or gallops Lungs:   CTA b/l, no wheezing, rhonchi or rales  Abd: soft, nontender MS: no deformity or atrophy Ext:  1+ edema to mid shin b/l edema  Skin: warm and dry, no rash Neuro:  No gross deficits appreciated Psych: euthymic mood, full affect  ILR site is stable, no tethering or discomfort   EKG:  Done today and reviewed by myself: SB 54bpm  ILR interrogation done today and reviewed by myself; Battery is good No new AF  01/11/2019: [facility] IMPRESSION: Erika Lee has normal coronary arteries and normal LV function.  The etiology of her chest pain, elevated troponins, EKG changes and wall  motion and imagine 2D echo are still unclear.  She has normal LV function by ventriculography.  She may have had coronary spasm.  The sheath was removed and a TR band was placed on the right wrist to achieve patent hemostasis.  The patient left lab in stable condition.  She can be discharged home later today on her home dose of Eliquis oral anticoagulation.   01/10/2019: TTE IMPRESSIONS  1. Severe akinesis of the left ventricular, mid-apical anteroseptal wall.  2. The left ventricle has low normal systolic function, with an ejection  fraction of 50-55%. The cavity size was normal. There is moderately  increased left ventricular wall thickness. Left ventricular diastolic  Doppler parameters are consistent with  impaired relaxation.  3. The right ventricle has normal systolic function. The cavity was  normal. There is no increase in right ventricular wall thickness.  FINDINGS  Left Ventricle: The left ventricle has low normal systolic function, with  an ejection fraction of 50-55%. The cavity size was normal. There is  moderately increased left ventricular wall thickness. Left ventricular  diastolic Doppler parameters are  consistent with impaired relaxation. Severe akinesis of the left  ventricular, mid-apical anteroseptal wall.   Right Ventricle: The right ventricle has normal systolic function. The  cavity was normal. There is no increase in right ventricular wall  thickness.   Left Atrium: Left atrial size was normal in size.   Right Atrium: Right atrial size was normal in size. Right atrial pressure  is estimated at 10 mmHg.   Interatrial Septum: No atrial level shunt detected by color flow Doppler.   Pericardium: There is no evidence of pericardial effusion.   Mitral Valve: The mitral valve is normal in structure. Mitral valve  regurgitation is trivial by color flow Doppler.   Tricuspid Valve: The tricuspid valve is normal in structure. Tricuspid  valve regurgitation was  not visualized by color flow Doppler.   Aortic Valve: The aortic valve is normal in structure. Aortic valve  regurgitation was not visualized by color flow Doppler.   Pulmonic Valve: The pulmonic valve was normal in structure. Pulmonic valve  regurgitation is not visualized by color flow Doppler.   Venous: The inferior vena cava is normal in size with greater than 50%  respiratory variability.    07/15/17: TTE Study Conclusions -  Left ventricle: Systolic function was normal. The estimated ejection fraction was in the range of 55% to 60%. Wall motion was normal; there were no regional wall motion abnormalities. - Aortic valve: No evidence of vegetation. - Mitral valve: No evidence of vegetation. Mild regurgitation. - Left atrium: The atrium was mildly dilated. No evidence of thrombus in the atrial cavity or appendage. - Atrial septum: No defect or patent foramen ovale was identified. There was redundancy of the septum, with borderline criteria for aneurysm. - Tricuspid valve: No evidence of vegetation.   Recent Labs: 10/19/2019: ALT 10; Hemoglobin 13.5; Platelets 210 12/21/2019: BUN 9; Creat 0.76; Potassium 4.3; Sodium 133  01/10/2019: VLDL 16 07/21/2019: Cholesterol 146; HDL 50; LDL Cholesterol (Calc) 76; Total CHOL/HDL Ratio 2.9; Triglycerides 123   CrCl cannot be calculated (Unknown ideal weight.).   Wt Readings from Last 3 Encounters:  11/12/19 160 lb (72.6 kg)  10/19/19 162 lb (73.5 kg)  07/20/19 164 lb (74.4 kg)     Other studies reviewed: Additional studies/records reviewed today include: summarized above  ASSESSMENT AND PLAN:  1. Persistent AFib, Aflutter     S/p CTI and PVI ablation 2015      CHA2DS2Vasc is 4, on Eliquis, appropriately dosed 2. ILR     0% AF burden   3. HTN     Looks good but c/w uncomfortable edema with the amlodipine     Would be ideal if able to keep small dose on board with ? Of coronary spasm last year     Reduce  amlodipine to 2.5mg  daily, instructed to elevate feet when seated and wear support stockings   4. HLD     Not addressed today     Disposition: She sees her PMD 01/18/20, she is asked to keep an eye on her BP with the reduced dose and can defer BP management to him.    Current medicines are reviewed at length with the patient today.  The patient did not have any concerns regarding medicines.  Venetia Night, PA-C 01/05/2020 6:01 AM     [facility] [address] [place] [address] [phone] (office)  [phone] (fax)

## 2020-01-06 ENCOUNTER — Other Ambulatory Visit: Payer: Self-pay | Admitting: Family Medicine

## 2020-01-06 ENCOUNTER — Telehealth: Payer: Self-pay | Admitting: Internal Medicine

## 2020-01-06 ENCOUNTER — Ambulatory Visit: Payer: Self-pay | Admitting: Family Medicine

## 2020-01-06 ENCOUNTER — Other Ambulatory Visit: Payer: Self-pay

## 2020-01-06 ENCOUNTER — Telehealth: Payer: Self-pay | Admitting: Podiatry

## 2020-01-06 ENCOUNTER — Ambulatory Visit: Payer: PPO | Admitting: Physician Assistant

## 2020-01-06 VITALS — BP 120/66 | HR 54 | Ht 65.0 in | Wt 161.0 lb

## 2020-01-06 DIAGNOSIS — I480 Paroxysmal atrial fibrillation: Secondary | ICD-10-CM | POA: Diagnosis not present

## 2020-01-06 DIAGNOSIS — I10 Essential (primary) hypertension: Secondary | ICD-10-CM

## 2020-01-06 DIAGNOSIS — Z79899 Other long term (current) drug therapy: Secondary | ICD-10-CM | POA: Diagnosis not present

## 2020-01-06 MED ORDER — AMLODIPINE BESYLATE 5 MG PO TABS: 2.5000 mg | ORAL_TABLET | Freq: Every day | ORAL | 3 refills | Status: DC

## 2020-01-06 MED ORDER — METFORMIN HCL 500 MG PO TABS: 500.0000 mg | ORAL_TABLET | Freq: Every day | ORAL | 3 refills | Status: DC

## 2020-01-06 NOTE — Telephone Encounter (Signed)
Pt came in wanting to know if Dr, Milinda Pointer ever contacted her cardiologist, to see if she can stop taking Eliquis for two days in order to get a procedure done on her toe. Please advise.

## 2020-01-06 NOTE — Telephone Encounter (Signed)
    [facility] Pre-operative Risk Assessment    HEARTCARE STAFF: - Please ensure there is not already an duplicate clearance open for this procedure. - Under Visit Info/Reason for Call, type in Other and utilize the format Clearance MM/DD/YY or Clearance TBD. Do not use dashes or single digits. - If request is for dental extraction, please clarify the # of teeth to be extracted.  Request for surgical clearance:  1. What type of surgery is being performed? Tenotomy and nail removal of the second toe right foot   2. When is this surgery scheduled? TBD   3. What type of clearance is required (medical clearance vs. Pharmacy clearance to hold med vs. Both)? Both   4. Are there any medications that need to be held prior to surgery and how long? Eliquis TBD how long by Dr  5. Practice name and name of physician performing surgery? [facility], Dr. Milinda Pointer   6. What is the office phone number? [phone]   7.   What is the office fax number? [phone]  8.   Anesthesia type (None, local, MAC, general) ? [place] 01/06/2020, 2:45 PM  _________________________________________________________________   (provider comments below)

## 2020-01-06 NOTE — Patient Instructions (Signed)
Medication Instructions:   START  TAKING AMLODIPINE 2.5 MG ONCE A DAY   *If you need a refill on your cardiac medications before your next appointment, please call your pharmacy*   Lab Work:  NONE ORDERED  TODAY'  If you have labs (blood work) drawn today and your tests are completely normal, you will receive your results only by: Marland Kitchen MyChart Message (if you have MyChart) OR . A paper copy in the mail If you have any lab test that is abnormal or we need to change your treatment, we will call you to review the results.   Testing/Procedures:  NONE ORDERED  TODAY     Follow-Up: At [facility], you and your health needs are our priority.  As part of our continuing mission to provide you with exceptional heart care, we have created designated Provider Care Teams.  These Care Teams include your primary Cardiologist (physician) and Advanced Practice Providers (APPs -  Physician Assistants and Nurse Practitioners) who all work together to provide you with the care you need, when you need it.  We recommend signing up for the patient portal called "MyChart".  Sign up information is provided on this After Visit Summary.  MyChart is used to connect with patients for Virtual Visits (Telemedicine).  Patients are able to view lab/test results, encounter notes, upcoming appointments, etc.  Non-urgent messages can be sent to your provider as well.   To learn more about what you can do with MyChart, go to [email or web address].    Your next appointment:   6 month(s)  The format for your next appointment:   In Person  Provider:   You may see None Dr. Rayann Heman  or one of the following Advanced Practice Providers on your designated Care Team:    Chanetta Marshall, NP  Tommye Standard, PA-C  Legrand Como "Oda Kilts, [place]    Other Instructions

## 2020-01-06 NOTE — Telephone Encounter (Signed)
Clinical pharmacist to review ELiquis 

## 2020-01-06 NOTE — Telephone Encounter (Signed)
Pt called for refill on Metformin, I called [facility] they are going to switch over providers, from Dr. Luan Pulling to [name].Marland Kitchen

## 2020-01-06 NOTE — Telephone Encounter (Signed)
Did you do this?

## 2020-01-07 NOTE — Telephone Encounter (Signed)
Left message for the patient to call back and speak to the preop APP. 

## 2020-01-07 NOTE — Telephone Encounter (Signed)
Patient with diagnosis of atrial fibrillation on Eliquis for anticoagulation.    Procedure: tenotomy and nail removal of second right toe Date of procedure: TBD  CHADS2-VASc score of  6 (HTN, AGE x 2, DM2, CAD, female)  CrCl 73 Platelet count 210  Per office protocol, patient can hold Eliquis for 1 days prior to procedure.   Patient will not need bridging with Lovenox (enoxaparin) around procedure.

## 2020-01-13 NOTE — Telephone Encounter (Signed)
   Primary Cardiologist: Thompson Grayer, MD  Left a voicemail for patient to call back for ongoing preop assessment.  Abigail Butts, PA-C 01/13/2020, 9:24 AM

## 2020-01-14 NOTE — Telephone Encounter (Signed)
Follow Up ° °Patient is returning call. Please give patient a call back.  °

## 2020-01-14 NOTE — Telephone Encounter (Signed)
   Primary Cardiologist: Thompson Grayer, MD  Chart reviewed as part of pre-operative protocol coverage. Patient was contacted 01/14/2020 in reference to pre-operative risk assessment for pending surgery as outlined below.  [name] was last seen on 01/06/20 by Tommye Standard PA-C. H/o DM, GERD, HTN, AFlutter/AFib (s/p CTI and PVI ablation 2015), July 2020 suffered NSTEMI > echo noted preserved LVEF with new WMA > cath with normal coronaries, possibly had spasm. Has had issues with lower extremity edema, possibly felt related to amlodipine. 2D echo 12/2018 EF 50-55% with WMA.  Tried to reach patient to discuss how she is doing clinically but got VM. LMTCB (3rd call - messages also left 7/9 and 7/15). Number listed on DPR is the same. Will route to surgeon's office to let them know we have been unsuccessful at reaching patient and to please let her know to call us if they speak with her.  Charlie Pitter, PA-C 01/14/2020, 10:52 AM

## 2020-01-14 NOTE — Telephone Encounter (Signed)
   Primary Cardiologist: Thompson Grayer, MD  Chart reviewed as part of pre-operative protocol coverage. Spoke with patient who affirms she is doing well without new cardiac symptoms - feels well without CP, SOB, palpitations, syncope or dizziness. Given past medical history and time since last visit, based on ACC/AHA guidelines, GALE KLAR would be at acceptable risk for the planned procedure without further cardiovascular testing.   Per pharmD, per office protocol, patient can hold Eliquis for 1 days prior to procedure.  Patient will not need bridging with Lovenox (enoxaparin) around procedure.  I will route this recommendation to the requesting party via Epic fax function and remove from pre-op pool.  Please call with questions.  Charlie Pitter, PA-C 01/14/2020, 5:20 PM

## 2020-01-17 ENCOUNTER — Ambulatory Visit (INDEPENDENT_AMBULATORY_CARE_PROVIDER_SITE_OTHER): Payer: PPO | Admitting: *Deleted

## 2020-01-17 DIAGNOSIS — I495 Sick sinus syndrome: Secondary | ICD-10-CM | POA: Diagnosis not present

## 2020-01-17 LAB — CUP PACEART REMOTE DEVICE CHECK
Date Time Interrogation Session: 20210718232311
Implantable Pulse Generator Implant Date: 20190419

## 2020-01-18 ENCOUNTER — Other Ambulatory Visit: Payer: Self-pay

## 2020-01-18 ENCOUNTER — Encounter: Payer: Self-pay | Admitting: Family Medicine

## 2020-01-18 ENCOUNTER — Ambulatory Visit (INDEPENDENT_AMBULATORY_CARE_PROVIDER_SITE_OTHER): Payer: PPO | Admitting: Family Medicine

## 2020-01-18 VITALS — BP 124/68 | HR 56 | Temp 98.1°F | Resp 14 | Ht 65.0 in | Wt 157.0 lb

## 2020-01-18 DIAGNOSIS — E118 Type 2 diabetes mellitus with unspecified complications: Secondary | ICD-10-CM | POA: Diagnosis not present

## 2020-01-18 MED ORDER — PROMETHAZINE HCL 12.5 MG PO TABS: ORAL_TABLET | ORAL | 0 refills | Status: DC

## 2020-01-18 MED ORDER — CLOTRIMAZOLE 1 % EX CREA: 1.0000 "application " | TOPICAL_CREAM | Freq: Two times a day (BID) | CUTANEOUS | 0 refills | Status: DC

## 2020-01-18 NOTE — Progress Notes (Signed)
Subjective:    Patient ID: Erika Lee, female    DOB: [birth year], 74 y.o.   MRN: [id number]  Medication Refill   10/20 Patient is a very pleasant 74 year old Caucasian female here today to establish care with her husband.  She was previously under the care of Dr. Sinda Du who is retired.  She is transferring her care to this clinic.  Past medical history is significant for myocardial infarction.  Recently was hospitalized in July with chest pain.  Troponin levels were elevated suggesting a non-ST elevation myocardial infarction.  She was transferred to [facility] where she underwent catheterization that revealed normal coronary arteries.  Suspected cause was coronary vasospasm.  Patient also has a history of paroxysmal atrial fibrillation.  She has an implanted loop recorder performed by Dr. Thompson Grayer.  Recent review does show paroxysms of A. fib.  She is currently on Eliquis for prevention of stroke.  She denies any syncope or palpitations or tachycardia.  She also has a history of type 2 diabetes mellitus.  When she was admitted to the hospital in July, her hemoglobin A1c was well controlled at 6.5.  Her cholesterol was excellent as was the remainder of her lab work.  She is due for repeat labs in 3 months.  She also has a history of chronic joint pain.  She has had rotator cuff repair in her left shoulder.  She has persistent pain in the left shoulder.  She also has pain in her right shoulder and suspects that she needs a rotator cuff repair and that is well.  She also reports chronic low back pain as well as chronic knee pain.  Due to her anticoagulant, she is unable to take NSAIDs.  Therefore her previous PCP has been giving the patient hydrocodone/acetaminophen 7.5/325, 120 tablets every 30 days.  This equates to 4 tablets a day.  She takes it approximately every 6 hours for pain.  She uses MiraLAX to prevent chronic constipation.  I reviewed the national narcotics database to confirm no  other prescribers.  She does have 1 isolated prescription for 40 tablets of oxycodone by Dr. Victorino December.  Otherwise she is only been receiving her prescriptions for the last 2 years from Dr. Luan Pulling.  Therefore I agreed to prescribe the patient's chronic pain medication assuming that she received no other prescriptions from any other provider.  Regarding her cancer screening, the patient had a colonoscopy last in 2013.  She is due for repeat colonoscopy in 2023.  Recently she was found to have thyroid nodules on a CT scan.  Patient had thyroid ultrasound in August 2020.  Repeat ultrasound was recommended in August 2021.  She has not had a mammogram due to the presence of the loop recorder.  She states that her cardiologist recommended against a mammogram due to the pain that it would likely cause from the loop recorder.  At that time, my plan was: I will continue to prescribe the patient 120 tablets of hydrocodone/acetaminophen 7.5/325 every month for her chronic low back pain and chronic shoulder pain.  She is currently appropriately anticoagulated for her paroxysmal atrial fibrillation.  Today she is in normal sinus rhythm and her rate is controlled.  Her last hemoglobin A1c was obtained in July and this was well controlled.  Therefore I recommended repeating a hemoglobin A1c with fasting lab work in 3 months.  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  Today we discussed switching hydrochlorothiazide  to amlodipine.  I explained that amlodipine would likely exacerbate some mild swelling she has in her ankles due to chronic venous insufficiency and could potentially worsen her already present bradycardia.  She would like to discuss this with her cardiologist prior to making any changes.  I did explain that amlodipine may help prevent future coronary spasms.  Regarding her thyroid nodule I will schedule the patient for repeat thyroid ultrasound in 1 year.  I will contact  radiology and see if the patient would be appropriate for a breast ultrasound to screen for malignancy since she is unable to tolerate a mammogram.  Recheck in 3 months  07/20/19 Patient has several concerns today.  First she would like to increase the dose of her pain medication.  She states that the 7.5 mg hydrocodone's last only an hour or so and then quickly wear off.  She takes these 4 times a day but they provide her very little relief.  She reports near constant pain in her lower back and now pain in both knees as well.  This limits her ability to cook, clean her home, or perform housework.  She is hoping that by increasing the strength of the medication she would be able to function better around the home and do the tasks that are required.  She is overdue for fasting lab work.  She denies any polyuria, polydipsia, or blurry vision.  She denies any hypoglycemic episodes however she is due for a hemoglobin A1c.  Of note, her blood pressure today is elevated at 170/80.  She has been checking her blood pressure at home and states that it is normally not this high however she is not sure how accurate her cuff is.  She denies any chest pain, shortness of breath, or dyspnea on exertion.  She does have mild bradycardia with a heart rate near 50 bpm and she is on metoprolol.  She is thought about the amlodipine I mentioned last time and has decided against amlodipine due to the potential for bradycardia worsening and also for the potential of worsening swelling in her extremities.  10/19/19 Since the patient's last visit, I have increased her hydrocodone to 10 mg, 120 tablets a month.  She seems to be doing better on this.  She would like to continue this dose.  There is no evidence of abuse or diversion.  She denies any chest pain.  She denies any shortness of breath.  She denies any dyspnea on exertion.  She denies any palpitations or syncope or near syncope.  She denies any hypoglycemia.  She is not fasting  today however her last cholesterol when checked in January was outstanding.  She is due to recheck her hemoglobin A1c today.  She has had her Covid vaccination.  She also recently had a [employer] screening which showed no evidence of carotid artery stenosis.  Showed no evidence of peripheral vascular disease.  At that time, my plan was:'Blood pressure today is outstanding.  Repeat a hemoglobin A1c.  Goal hemoglobin A1c is less than 6.5.  I would be willing to tolerate hemoglobin A1c up to 7.  LDL cholesterol is well below 70 at her last check.  Therefore I will not check a fasting lipid panel today.  She denies any symptoms of angina or shortness of breath or dyspnea on exertion.  Her pain is better managed since we increased her Norco to 10/325 4 times daily.  Therefore I will continue his current dose.  Monitor her liver and  kidney test with a CMP.  She does have trace edema in the dorsums of both feet however this is not severe and does not require additional diuretics at the present time.  I did recommend a low-sodium diet.  01/18/20 Patient is here today to recheck her diabetes.  She states that her blood sugars typically between 101 150 in the morning.  She denies any hypoglycemic episodes.  She denies any polyuria, polydipsia, or blurry vision.  She denies any chest pain or shortness of breath or dyspnea on exertion.  Blood pressure today is well controlled at 124/68.  Patient does have erythema in her navel.  The skin in the navel and around the navel is erythematous and pink with white exudate coming from the navel.  She states that it itches.  It appears to be a yeast infection  Past Medical History:  Diagnosis Date  . Anticoagulant long-term use    eliquis  . Chronic sinusitis   . GERD (gastroesophageal reflux disease)   . Hemorrhoids   . History of colitis 10/2016   campylobacter  . Hyperlipidemia   . Hypertension   . Multinodular goiter    per pt has had 2 biopsy's both benign  . NSTEMI  (non-ST elevated myocardial infarction) ([place])    12/2018- normal coronary arteries on cath, poss coronary vasospasm.   . Osteoarthritis    "all over"  and AC joint left shoulder  . Paroxysmal atrial fibrillation [facility]) EP cardiologist--  [name]   s/p PVI by [name] 07/16/2013;   pt first dx 2008,  recurrence 2014 afib/flutter and tachy-brady  . Rotator cuff tear, left   . Shoulder impingement, left   . Status post placement of implantable loop recorder    original placement 07-26-2014;  removal / replacement 10-17-2017  by dr allred  . Type 2 diabetes mellitus ([place])    followed by pcp   Past Surgical History:  Procedure Laterality Date  . ATRIAL FIBRILLATION ABLATION N/A 07/16/2013   PVI and CTI ablation by [name]  . CARDIAC CATHETERIZATION     01/11/2019- Normal coronary arteries.   . COLONOSCOPY  09/10/2011   Dr. Arnoldo Morale: normal, 10 year follow up.  Marland Kitchen LEFT HEART CATH AND CORONARY ANGIOGRAPHY N/A 01/11/2019   Procedure: LEFT HEART CATH AND CORONARY ANGIOGRAPHY;  Surgeon: Lorretta Harp, MD;  Location: [place] [facility];  Service: Cardiovascular;  Laterality: N/A;  . LESION REMOVAL N/A 05/03/2013   Procedure: EXCISION CYST, BACK;  Surgeon: Jamesetta So, MD;  Location: [facility];  Service: General;  Laterality: N/A;  . LOOP RECORDER IMPLANT N/A 07/26/2014   Procedure: LOOP RECORDER IMPLANT;  Surgeon: Thompson Grayer, MD;  Location: [facility] CATH LAB;  Service: Cardiovascular;  Laterality: N/A;  . LOOP RECORDER INSERTION N/A 10/17/2017   MDT previously implanted ILR for afib management at [facility].  old device removed and new device placed by [name]  . LOOP RECORDER REMOVAL N/A 10/17/2017   MDT ILR removed with new device subsequently replaced  . SHOULDER ARTHROSCOPY WITH ROTATOR CUFF REPAIR AND SUBACROMIAL DECOMPRESSION Left 01/07/2019   Procedure: Left shoulder subacromial decompression, distal clavicle resection, extensive debridement,;  Surgeon: Nicholes Stairs, MD;  Location: [facility];  Service: Orthopedics;  Laterality: Left;  . TEE WITHOUT CARDIOVERSION N/A 07/15/2013   Procedure: TRANSESOPHAGEAL ECHOCARDIOGRAM (TEE);  Surgeon: Lelon Perla, MD;  Location: [facility] ENDOSCOPY;  Service: Cardiovascular;  Laterality: N/A;  . TONSILLECTOMY  age 5  . TRANSANAL HEMORRHOIDAL  DEARTERIALIZATION N/A 09/22/2015   Procedure: TRANSANAL HEMORRHOIDAL LIGATION/PEXY EUA POSSIBLE HEMORRHOIDECTOMY ;  Surgeon: Michael Boston, MD;  Location: [facility];  Service: General;  Laterality: N/A;   Current Outpatient Medications on File Prior to Visit  Medication Sig Dispense Refill  . amLODipine (NORVASC) 5 MG tablet Take 0.5 tablets (2.5 mg total) by mouth daily. 90 tablet 3  . apixaban (ELIQUIS) 5 MG TABS tablet Take 1 tablet (5 mg total) by mouth 2 (two) times daily. 180 tablet 3  . esomeprazole (NEXIUM) 20 MG capsule Take 20 mg by mouth daily.     . hydrochlorothiazide 25 MG tablet Take 25 mg by mouth daily.     Marland Kitchen HYDROcodone-acetaminophen (NORCO) 10-325 MG tablet Take 1 tablet by mouth every 6 (six) hours as needed. 120 tablet 0  . Lancets (FREESTYLE) lancets 1 each daily.    Marland Kitchen lisinopril (ZESTRIL) 40 MG tablet Take 1 tablet (40 mg total) by mouth daily. 90 tablet 3  . metFORMIN (GLUCOPHAGE) 500 MG tablet TAKE 1 TABLET BY MOUTH EVERY DAY 30 tablet 4  . metoprolol succinate (TOPROL-XL) 25 MG 24 hr tablet Take 0.5 tablets (12.5 mg total) by mouth daily. 45 tablet 3  . nitroGLYCERIN (NITROSTAT) 0.4 MG SL tablet PLACE 1 TABLET UNDER THE TONGUE EVERY 5 MINUTES AS NEEDED FOR CHEST PAIN 25 tablet 3  . ondansetron (ZOFRAN ODT) 4 MG disintegrating tablet Take 1 tablet (4 mg total) by mouth every 8 (eight) hours as needed for nausea or vomiting. 20 tablet 3  . Potassium Gluconate 550 MG TABS Take 550 mg by mouth at bedtime.     . promethazine (PHENERGAN) 12.5 MG tablet TAKE 1 TABLET(12.5 MG) BY MOUTH EVERY 8 HOURS AS NEEDED FOR NAUSEA OR VOMITING 30 tablet 0  . simvastatin (ZOCOR) 40 MG tablet Take  1 tablet (40 mg total) by mouth at bedtime. 90 tablet 1  . spironolactone (ALDACTONE) 25 MG tablet Take 1 tablet (25 mg total) by mouth daily. 90 tablet 3  . triamcinolone cream (KENALOG) 0.1 % Apply 1 application topically 2 (two) times daily. 45 g 0   No current facility-administered medications on file prior to visit.   Allergies  Allergen Reactions  . Clindamycin/Lincomycin Rash  . Doxycycline Other (See Comments)    Makes heart race  . Keflex [Cephalexin] Nausea And Vomiting  . Adhesive [Tape] Rash  . Latex Rash  . Sulfonamide Derivatives Nausea And Vomiting   Social History   Socioeconomic History  . Marital status: Married    Spouse name: Not on file  . Number of children: Not on file  . Years of education: Not on file  . Highest education level: Not on file  Occupational History  . Occupation: Pensions consultant: RETIRED    Comment: Full time  Tobacco Use  . Smoking status: Former Smoker    Years: 34.00    Types: Cigarettes    Quit date: 01/02/1994    Years since quitting: 26.0  . Smokeless tobacco: Never Used  Vaping Use  . Vaping Use: Never used  Substance and Sexual Activity  . Alcohol use: No  . Drug use: No  . Sexual activity: Not on file  Other Topics Concern  . Not on file  Social History Narrative   Lives with spouse in [place]   Social Determinants of Health   Financial Resource Strain:   . Difficulty of Paying Living Expenses:   Food Insecurity:   . Worried About [employer] of  Food in the Last Year:   . [place] in the Last Year:   Transportation Needs:   . Film/video editor (Medical):   Marland Kitchen Lack of Transportation (Non-Medical):   Physical Activity:   . Days of Exercise per Week:   . Minutes of Exercise per Session:   Stress:   . Feeling of Stress :   Social Connections:   . Frequency of Communication with Friends and Family:   . Frequency of Social Gatherings with Friends and Family:   . Attends Religious  Services:   . Active Member of Clubs or Organizations:   . Attends Archivist Meetings:   Marland Kitchen Marital Status:   Intimate Partner Violence:   . Fear of Current or Ex-Partner:   . Emotionally Abused:   Marland Kitchen Physically Abused:   . Sexually Abused:      Review of Systems  All other systems reviewed and are negative.      Objective:   Physical Exam Vitals reviewed.  Constitutional:      General: She is not in acute distress.    Appearance: Normal appearance. She is not ill-appearing or toxic-appearing.  Eyes:     Extraocular Movements: Extraocular movements intact.     Conjunctiva/sclera: Conjunctivae normal.     Pupils: Pupils are equal, round, and reactive to light.  Cardiovascular:     Rate and Rhythm: Normal rate and regular rhythm.     Pulses: Normal pulses.     Heart sounds: Normal heart sounds. No murmur heard.  No friction rub. No gallop.   Pulmonary:     Effort: Pulmonary effort is normal. No respiratory distress.     Breath sounds: Normal breath sounds. No stridor. No wheezing, rhonchi or rales.  Abdominal:     General: Abdomen is flat. Bowel sounds are normal. There is no distension.     Palpations: Abdomen is soft.     Tenderness: There is no abdominal tenderness. There is no guarding.    Musculoskeletal:     Right shoulder: Tenderness present. Decreased range of motion.     Left shoulder: Decreased range of motion.     Cervical back: Neck supple.     Lumbar back: Tenderness present. Decreased range of motion.     Right lower leg: Edema present.     Left lower leg: Edema present.  Skin:    General: Skin is warm.     Coloration: Skin is not jaundiced.     Findings: No bruising, erythema, lesion or rash.  Neurological:     General: No focal deficit present.     Mental Status: She is alert and oriented to person, place, and time.     Cranial Nerves: No cranial nerve deficit.     Sensory: No sensory deficit.     Motor: No weakness.     Coordination:  Coordination normal.     Gait: Gait normal.     Deep Tendon Reflexes: Reflexes normal.  Psychiatric:        Mood and Affect: Mood normal.        Thought Content: Thought content normal.           Assessment & Plan:  Controlled type 2 diabetes mellitus with complication, without long-term current use of insulin ([place]) - Plan: COMPLETE METABOLIC PANEL WITH GFR, Hemoglobin A1c  Diabetes sounds well controlled.  I will check a CMP to monitor renal function and potassium along with her liver function test.  I will check  an A1c to ensure adequate control of her diabetes.  Diabetic foot exam is normal today except for pitting edema which I believe is secondary to her amlodipine.  Her cardiologist recently decreased the dose to 5 mg and added spironolactone.  Therefore I will check a CMP to monitor her potassium and renal function.  Goal hemoglobin A1c is less than 7.  I believe she has a yeast infection in her navel.  I will treat this with Lotrimin cream applied 2-3 times a day for 1 week and to keep the area clean and dry

## 2020-01-18 NOTE — Progress Notes (Signed)
Carelink Summary Report / Loop Recorder 

## 2020-01-19 LAB — COMPLETE METABOLIC PANEL WITH GFR
AG Ratio: 2 (calc) (ref 1.0–2.5)
ALT: 10 U/L (ref 6–29)
AST: 14 U/L (ref 10–35)
Albumin: 4.5 g/dL (ref 3.6–5.1)
Alkaline phosphatase (APISO): 77 U/L (ref 37–153)
BUN: 12 mg/dL (ref 7–25)
CO2: 22 mmol/L (ref 20–32)
Calcium: 10.4 mg/dL (ref 8.6–10.4)
Chloride: 96 mmol/L — ABNORMAL LOW (ref 98–110)
Creat: 0.9 mg/dL (ref 0.60–0.93)
GFR, Est African American: 72 mL/min/{1.73_m2} (ref >=60)
GFR, Est Non African American: 62 mL/min/{1.73_m2} (ref >=60)
Globulin: 2.3 g/dL (calc) (ref 1.9–3.7)
Glucose, Bld: 120 mg/dL — ABNORMAL HIGH (ref 65–99)
Potassium: 4.5 mmol/L (ref 3.5–5.3)
Sodium: 131 mmol/L — ABNORMAL LOW (ref 135–146)
Total Bilirubin: 0.6 mg/dL (ref 0.2–1.2)
Total Protein: 6.8 g/dL (ref 6.1–8.1)

## 2020-01-19 LAB — HEMOGLOBIN A1C
Hgb A1c MFr Bld: 6.8 % of total Hgb — ABNORMAL HIGH (ref <5.7)
Mean Plasma Glucose: 148 (calc)
eAG (mmol/L): 8.2 (calc)

## 2020-01-20 ENCOUNTER — Encounter: Payer: Self-pay | Admitting: *Deleted

## 2020-01-25 ENCOUNTER — Telehealth: Payer: Self-pay | Admitting: Internal Medicine

## 2020-01-25 NOTE — Telephone Encounter (Signed)
New Message:    Pt said she need to talk to a nurse about her Eliquis. I asked her and she said she had some questions,. She  really wanted to talk to the doctor, but she would be good talking with the nurse.

## 2020-01-25 NOTE — Telephone Encounter (Signed)
Returned call to pt, pt states she called in a refill for her Eliquis rx today and it's going to be $154 this month because she is now in the donut hole, so next month it will be even more expensive.  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  Had a lengthy discussion with pt about Warfarin as an alternative that is cost effective as far as prescription price, but requires follow-ups in office for weekly/monthly monitoring of INR's. Advised pt there are other DOAC's she could inquire with her insurance to see if they were cheaper.  Pt states she already called them and they gave her the name of Jantoven as a cheaper alternative.  Advised pt Ervin Knack is the name of a generic manufacturer of Warfarin same drug we discussed in detail earlier. Pt inquired about pt assistance for Eliquis, advised pt the drug manufacturer does have a pt assistance program, but I am not sure what the income parameters are, but I will forward this message to [place] in our office who helps pt's with pt assistance forms/eligibility to see if pt possibly qualifies.    Pt states she is scheduled for toe surgery in August and has already been cleared to hold the Eliquis x 1 day prior to procedure and she does not want anything to interfere with this surgery, so she is going to try and fill the current Eliquis rx for $154.00, but would like an appt with [name] in the mean time, to discuss alternatives.  Please call pt to schedule follow-up appt.  Thanks

## 2020-01-25 NOTE — Telephone Encounter (Signed)
Appt made with Dr. Rayann Heman

## 2020-01-26 NOTE — Telephone Encounter (Addendum)
**Note De-Identified  Obfuscation** The pt states that she paid $153 yesterday for a 30 day supply of her Eliquis and that she cannot afford that.  The pt has a lot of questions concerning applying for asst for her Eliquis with [place] Pt Asst. I answered all of her questions that I could and gave her [facility] pt asst phone number to call with questions that I could not answer and to request that they mail her an application.  She is aware to complete her part of the application, obtain required documents per [facility], and to bring all to the office to drop off and that we will take care of the provider part and will fax all to [facility] Pt Asst program.  She wrote down everything we discussed and repeated it back to me. She verbalized understanding to all info given and thanked me for calling her.

## 2020-02-01 ENCOUNTER — Other Ambulatory Visit: Payer: Self-pay

## 2020-02-01 ENCOUNTER — Ambulatory Visit: Payer: PPO | Admitting: Podiatry

## 2020-02-01 ENCOUNTER — Encounter: Payer: Self-pay | Admitting: Podiatry

## 2020-02-01 DIAGNOSIS — M2041 Other hammer toe(s) (acquired), right foot: Secondary | ICD-10-CM | POA: Diagnosis not present

## 2020-02-01 DIAGNOSIS — L600 Ingrowing nail: Secondary | ICD-10-CM | POA: Diagnosis not present

## 2020-02-01 MED ORDER — NEOMYCIN-POLYMYXIN-HC 1 % OT SOLN
OTIC | 1 refills | Status: DC
Start: 2020-02-01 — End: 2020-07-24

## 2020-02-01 NOTE — Progress Notes (Signed)
She presents today with chief complaint of painful distal clavus second digit right foot we had talked about removing the toenail the clavus and doing a flexor tenotomy on the right foot.  At this point she has already stopped her Eliquis she has been off of her Eliquis since Sunday.  Objective: Vital signs are stable she alert oriented x3 secondary to the right foot demonstrates a distal clavus of painful dystrophic nail and flexor contracture.  Assessment: Contracted digit painful callus and toenail.  Plan: After local anesthetic with epinephrine was administered a 50-50 mixture of Marcaine plain and lidocaine with epinephrine was utilized a total of 3 cc was injected proximal to the metatarsophalangeal joint.  The area was prepped and draped in normal sterile fashion an 18-gauge needle was utilized to transect the long flexor tendon at the level of the PIPJ and also performed a capsulotomy plantarly.  2 simple nylon stitches were placed 5 oh.  Attention was then directed to the toenail where the toenail was removed as well as the distal clavus 3 applications of phenol were applied if still had considerable bleeding most likely due to the Eliquis so we placed Surgicel coagulant on this and then then dressed it with a dressed a compressive dressing and will follow up with her on Thursday to change the dressing.  She will start back on her Eliquis Thursday.

## 2020-02-01 NOTE — Patient Instructions (Signed)

## 2020-02-07 ENCOUNTER — Ambulatory Visit
Admission: RE | Admit: 2020-02-07 | Discharge: 2020-02-07 | Disposition: A | Discharge status: Home / Self Care | Payer: PPO | Source: Ambulatory Visit | Attending: Family Medicine | Admitting: Family Medicine

## 2020-02-07 DIAGNOSIS — E041 Nontoxic single thyroid nodule: Secondary | ICD-10-CM

## 2020-02-07 DIAGNOSIS — E042 Nontoxic multinodular goiter: Secondary | ICD-10-CM | POA: Diagnosis not present

## 2020-02-07 DIAGNOSIS — E010 Iodine-deficiency related diffuse (endemic) goiter: Secondary | ICD-10-CM | POA: Diagnosis not present

## 2020-02-10 ENCOUNTER — Other Ambulatory Visit: Payer: Self-pay | Admitting: Family Medicine

## 2020-02-10 ENCOUNTER — Ambulatory Visit: Payer: Self-pay | Admitting: Pharmacist

## 2020-02-10 ENCOUNTER — Ambulatory Visit: Payer: PPO | Admitting: Podiatry

## 2020-02-10 ENCOUNTER — Other Ambulatory Visit: Payer: Self-pay

## 2020-02-10 DIAGNOSIS — T8141XA Infection following a procedure, superficial incisional surgical site, initial encounter: Secondary | ICD-10-CM

## 2020-02-10 DIAGNOSIS — E119 Type 2 diabetes mellitus without complications: Secondary | ICD-10-CM

## 2020-02-10 DIAGNOSIS — I10 Essential (primary) hypertension: Secondary | ICD-10-CM

## 2020-02-10 MED ORDER — HYDROCODONE-ACETAMINOPHEN 10-325 MG PO TABS: 1.0000 | ORAL_TABLET | Freq: Four times a day (QID) | ORAL | 0 refills | Status: AC | PRN

## 2020-02-10 MED ORDER — AMOXICILLIN-POT CLAVULANATE 875-125 MG PO TABS
1.0000 | ORAL_TABLET | Freq: Two times a day (BID) | ORAL | 0 refills | Status: DC
Start: 2020-02-10 — End: 2020-04-20

## 2020-02-10 NOTE — Patient Instructions (Addendum)
Visit Information Glad to talk to you again.  Please call me if you need anything. Goals Addressed            This Visit's Progress   . Diabetes - A1c < 7       CARE PLAN ENTRY (see longitudinal plan of care for additional care plan information)  Current Barriers:  . Diabetes: controlled; complicated by chronic medical conditions including hypertension and hyperlipidemia. . Lab Results .  Component . Value . Date/Time .   Marland Kitchen HGBA1C . 6.8 (H) . 01/18/2020 02:34 PM .   . HGBA1C . 6.3 (H) . 10/19/2019 03:02 PM .   . GFR . 72.30 . 07/22/2013 01:07 PM .   . GFR . 85.20 . 07/07/2013 09:39 AM .   . MICROALBUR . 0.5 . 07/21/2019 10:55 AM   .  Lab Results  Component Value Date   CREATININE 0.74 10/19/2019   CREATININE 0.75 07/21/2019   CREATININE 0.70 04/26/2019   . Current antihyperglycemic regimen: metformin 500mg  daily . Denies hypoglycemic symptoms, including dizziness, lightheadedness, shaking, sweating . Denies hyperglycemic symptoms, including polyuria, polydipsia, polyphagia, nocturia, blurred vision, neuropathy . Current exercise: minimal . Current blood glucose readings: reported AM blood sugard of 111-114  Pharmacist Clinical Goal(s):  Marland Kitchen Over the next 180 days, patient will work with PharmD and primary care provider to optimize medication related to hyperglycemia.  Interventions: . Comprehensive medication review performed, medication list updated in electronic medical record . Inter-disciplinary care team collaboration (see longitudinal plan of care) . Continue current therapy.  Patient Self Care Activities:  . Patient will check blood glucose daily, document, and provide at future appointments . Over the next 180 days: . Work on diet limiting carbohydrates and sugars . Patient will focus on medication adherence by pill box . Patient will take medications as prescribed . Patient will contact provider with any episodes of hypoglycemia . Patient will report any  questions or concerns to provider   Initial goal documentation      . High Blood Pressure - < 130/80       CARE PLAN ENTRY (see longitudinal plan of care for additional care plan information)  Current Barriers:  . Uncontrolled hypertension, complicated by diabetes, hyperlipidemia, and afib. . Current antihypertensive regimen: lisinopril 40mg  daily, Toprol XL 25mg  one-half tablet daily, Spironolactone 25mg  daily, Amlodipine 2.5mg  daily . Previous antihypertensives tried: none noted . Last practice recorded BP readings:  BP Readings from Last 3 Encounters:  01/18/20 124/68  01/06/20 120/66  12/13/19 132/75   . Current home BP readings: 120-130/60-70  Pharmacist Clinical Goal(s):  Marland Kitchen Over the next 180 days, patient will work with PharmD and providers to optimize antihypertensive regimen  Interventions: . Inter-disciplinary care team collaboration (see longitudinal plan of care) . Comprehensive medication review performed; medication list updated in the electronic medical record.   Patient Self Care Activities:  . Patient will continue to check BP twice daily , document, and provide at future appointments . Patient will focus on medication adherence by using a pill box. . Over the next 180 days patient will follow up with providers on home BP readings.  Please see past updates related to this goal by clicking on the "Past Updates" button in the selected goal         The patient verbalized understanding of instructions provided today and agreed to receive a mailed copy of patient instruction and/or educational materials.  Telephone follow up appointment with pharmacy team member scheduled for: 6  months  Beverly Milch, PharmD Clinical Pharmacist [facility] [phone]   Carbohydrate Counting for Diabetes Mellitus, Adult  Carbohydrate counting is a method of keeping track of how many carbohydrates you eat. Eating carbohydrates naturally increases the  amount of sugar (glucose) in the blood. Counting how many carbohydrates you eat helps keep your blood glucose within normal limits, which helps you manage your diabetes (diabetes mellitus). It is important to know how many carbohydrates you can safely have in each meal. This is different for every person. A diet and nutrition specialist (registered dietitian) can help you make a meal plan and calculate how many carbohydrates you should have at each meal and snack. Carbohydrates are found in the following foods:  Grains, such as breads and cereals.  Dried beans and soy products.  Starchy vegetables, such as potatoes, peas, and corn.  Fruit and fruit juices.  Milk and yogurt.  Sweets and snack foods, such as cake, cookies, candy, chips, and soft drinks. How do I count carbohydrates? There are two ways to count carbohydrates in food. You can use either of the methods or a combination of both. Reading "Nutrition Facts" on packaged food The "Nutrition Facts" list is included on the labels of almost all packaged foods and beverages in the [place] It includes:  The serving size.  Information about nutrients in each serving, including the grams (g) of carbohydrate per serving. To use the "Nutrition Facts":  Decide how many servings you will have.  Multiply the number of servings by the number of carbohydrates per serving.  The resulting number is the total amount of carbohydrates that you will be having. Learning standard serving sizes of other foods When you eat carbohydrate foods that are not packaged or do not include "Nutrition Facts" on the label, you need to measure the servings in order to count the amount of carbohydrates:  Measure the foods that you will eat with a food scale or measuring cup, if needed.  Decide how many standard-size servings you will eat.  Multiply the number of servings by 15. Most carbohydrate-rich foods have about 15 g of carbohydrates per serving. ? For  example, if you eat 8 oz (170 g) of strawberries, you will have eaten 2 servings and 30 g of carbohydrates (2 servings x 15 g = 30 g).  For foods that have more than one food mixed, such as soups and casseroles, you must count the carbohydrates in each food that is included. The following list contains standard serving sizes of common carbohydrate-rich foods. Each of these servings has about 15 g of carbohydrates:   hamburger bun or  English muffin.   oz (15 mL) syrup.   oz (14 g) jelly.  1 slice of bread.  1 six-inch tortilla.  3 oz (85 g) cooked rice or pasta.  4 oz (113 g) cooked dried beans.  4 oz (113 g) starchy vegetable, such as peas, corn, or potatoes.  4 oz (113 g) hot cereal.  4 oz (113 g) mashed potatoes or  of a large baked potato.  4 oz (113 g) canned or frozen fruit.  4 oz (120 mL) fruit juice.  4-6 crackers.  6 chicken nuggets.  6 oz (170 g) unsweetened dry cereal.  6 oz (170 g) plain fat-free yogurt or yogurt sweetened with artificial sweeteners.  8 oz (240 mL) milk.  8 oz (170 g) fresh fruit or one small piece of fruit.  24 oz (680 g) popped popcorn. Example of  carbohydrate counting Sample meal  3 oz (85 g) chicken breast.  6 oz (170 g) brown rice.  4 oz (113 g) corn.  8 oz (240 mL) milk.  8 oz (170 g) strawberries with sugar-free whipped topping. Carbohydrate calculation 1. Identify the foods that contain carbohydrates: ? Rice. ? Corn. ? Milk. ? Strawberries. 2. Calculate how many servings you have of each food: ? 2 servings rice. ? 1 serving corn. ? 1 serving milk. ? 1 serving strawberries. 3. Multiply each number of servings by 15 g: ? 2 servings rice x 15 g = 30 g. ? 1 serving corn x 15 g = 15 g. ? 1 serving milk x 15 g = 15 g. ? 1 serving strawberries x 15 g = 15 g. 4. Add together all of the amounts to find the total grams of carbohydrates eaten: ? 30 g + 15 g + 15 g + 15 g = 75 g of carbohydrates  total. Summary  Carbohydrate counting is a method of keeping track of how many carbohydrates you eat.  Eating carbohydrates naturally increases the amount of sugar (glucose) in the blood.  Counting how many carbohydrates you eat helps keep your blood glucose within normal limits, which helps you manage your diabetes.  A diet and nutrition specialist (registered dietitian) can help you make a meal plan and calculate how many carbohydrates you should have at each meal and snack. This information is not intended to replace advice given to you by your health care provider. Make sure you discuss any questions you have with your health care provider. Document Revised: 01/09/2017 Document Reviewed: 11/29/2015 Elsevier Patient Education  [place].

## 2020-02-10 NOTE — Progress Notes (Signed)
She presents today status post nail avulsion hallux right and flexor tenotomy.  She states that is been hurting a little bit and have not change the dressing at all.  Objective: Vital signs are stable alert and oriented x3 dressed her dressing was removed demonstrates sutures are intact and were removed there is no bleeding.  Was the distal aspect of the bandage was removed from the toe there is noted to have a dried Surgicel gauze around it.  Mild erythema there is some serosanguineous drainage no malodor.  The Surgicel was dried to the toe which I removed very carefully with leaving the skin intact.  There is areas of granulation on the nailbed.  Appears to be healing very nicely at this point.  Assessment: Cannot rule out some cellulitis or paronychia where the nail was removed.  And a well-healing flexor tenotomy.  Plan: At this point I will allow her to start soaking Epson salts and warm water second toe right foot and just applying a Neosporin dressing which I demonstrated for her today.  I also sent over some Augmentin just to cover her in case there is any type of bacterial infection.  I will follow-up with her in 1 to 2 weeks.

## 2020-02-10 NOTE — Chronic Care Management (AMB) (Addendum)
Chronic Care Management   Follow Up Note   02/10/2020 Name: FRUMA AFRICA MRN: [id number] DOB: 1946-05-22  Referred by: [name], MD Reason for referral : Chronic Care Management (Pharm D follow up)   Erika Lee is a 74 y.o. year old female who is a primary care patient of Pickard, Cammie Mcgee, MD. The CCM team was consulted for assistance with chronic disease management and care coordination needs.    Review of patient status, including review of consultants reports, relevant laboratory and other test results, and collaboration with appropriate care team members and the patient's provider was performed as part of comprehensive patient evaluation and provision of chronic care management services.    SDOH (Social Determinants of Health) assessments performed: No See Care Plan activities for detailed interventions related to [facility])      Outpatient Encounter Medications as of 02/10/2020  Medication Sig   amLODipine (NORVASC) 5 MG tablet Take 0.5 tablets (2.5 mg total) by mouth daily.   apixaban (ELIQUIS) 5 MG TABS tablet Take 1 tablet (5 mg total) by mouth 2 (two) times daily.   clotrimazole (LOTRIMIN AF) 1 % cream Apply 1 application topically 2 (two) times daily.   esomeprazole (NEXIUM) 20 MG capsule Take 20 mg by mouth daily.    hydrochlorothiazide 25 MG tablet Take 25 mg by mouth daily.    Lancets (FREESTYLE) lancets 1 each daily.   lisinopril (ZESTRIL) 40 MG tablet Take 1 tablet (40 mg total) by mouth daily.   metFORMIN (GLUCOPHAGE) 500 MG tablet TAKE 1 TABLET BY MOUTH EVERY DAY   metoprolol succinate (TOPROL-XL) 25 MG 24 hr tablet Take 0.5 tablets (12.5 mg total) by mouth daily.   NEOMYCIN-POLYMYXIN-HYDROCORTISONE (CORTISPORIN) 1 % SOLN OTIC solution Apply 1-2 drops to toe BID after soaking   nitroGLYCERIN (NITROSTAT) 0.4 MG SL tablet PLACE 1 TABLET UNDER THE TONGUE EVERY 5 MINUTES AS NEEDED FOR CHEST PAIN   Potassium Gluconate 550 MG TABS Take 550 mg by mouth at bedtime.      promethazine (PHENERGAN) 12.5 MG tablet TAKE 1 TABLET(12.5 MG) BY MOUTH EVERY 8 HOURS AS NEEDED FOR NAUSEA OR VOMITING   simvastatin (ZOCOR) 40 MG tablet Take 1 tablet (40 mg total) by mouth at bedtime.   spironolactone (ALDACTONE) 25 MG tablet Take 1 tablet (25 mg total) by mouth daily.   triamcinolone cream (KENALOG) 0.1 % Apply 1 application topically 2 (two) times daily.   No facility-administered encounter medications on file as of 02/10/2020.     Hypertension   BP goal is:  <130/80  Office blood pressures are  BP Readings from Last 3 Encounters:  01/18/20 124/68  01/06/20 120/66  12/13/19 132/75   Patient checks BP at home daily Patient home BP readings are ranging: 120-130/60-70  Patient has failed these meds in the past: none noted Patient is currently controlled on the following medications:  Amlodipine 2.5mg  daily Metoprolol XL 25mg  one-half tablet po daily Lisinopril 40mg  daily Spironolactone 25mg  daily  Cardiologist did start amlodipine.  Has recently had to decrease dose due to swelling.  Spironolactone started for swelling.  Reports swelling is tolerable currently with minimal swelling.  Reports home blood pressures much more controlled since starting new medications.  Denies dizziness.  Plan  Continue current medications, continue current monitoring.     Diabetes   A1c goal <7%  Recent Relevant Labs: Lab Results  Component Value Date/Time   HGBA1C 6.8 (H) 01/18/2020 02:34 PM   HGBA1C 6.3 (H) 10/19/2019 03:02 PM  GFR 72.30 07/22/2013 01:07 PM   GFR 85.20 07/07/2013 09:39 AM   MICROALBUR 0.5 07/21/2019 10:55 AM    Last diabetic Eye exam: No results found for: HMDIABEYEEXA  Last diabetic Foot exam: No results found for: HMDIABFOOTEX   Checking BG: 2x per Day  Recent FBG Readings: 111-114  Recent 2hr PP BG readings:  130s   Patient has failed these meds in past: none noted Patient is currently controlled on the following medications: Metformin  500mg  daily  A1c remains controlled, however creeping up.  Discussed diet low in carbohydrates and sugars.  Adherent to medication and reports no hypoglycemia.  Recommend recheck when she comes in for October appt.  Plan  Continue current medications, recommend A1c in October   Also printed out patient assistance application for Eliquis to give to patient as she reports high copays during the donut hole.  Goals Addressed             This Visit's Progress    Diabetes - A1c < 7       CARE PLAN ENTRY (see longitudinal plan of care for additional care plan information)  Current Barriers:  Diabetes: controlled; complicated by chronic medical conditions including hypertension and hyperlipidemia. Lab Results  Component Value Date/Time   HGBA1C 6.8 (H) 01/18/2020 02:34 PM   HGBA1C 6.3 (H) 10/19/2019 03:02 PM   GFR 72.30 07/22/2013 01:07 PM   GFR 85.20 07/07/2013 09:39 AM   MICROALBUR 0.5 07/21/2019 10:55 AM    Lab Results  Component Value Date   CREATININE 0.74 10/19/2019   CREATININE 0.75 07/21/2019   CREATININE 0.70 04/26/2019   Current antihyperglycemic regimen: metformin 500mg  daily Denies hypoglycemic symptoms, including dizziness, lightheadedness, shaking, sweating Denies hyperglycemic symptoms, including polyuria, polydipsia, polyphagia, nocturia, blurred vision, neuropathy Current exercise: minimal Current blood glucose readings: reported AM blood sugard of 111-114  Pharmacist Clinical Goal(s):  Over the next 180 days, patient will work with PharmD and primary care provider to optimize medication related to hyperglycemia.  Interventions: Comprehensive medication review performed, medication list updated in electronic medical record Inter-disciplinary care team collaboration (see longitudinal plan of care) Continue current therapy.  Patient Self Care Activities:  Patient will check blood glucose daily, document, and provide at future appointments Over the next  180 days: Work on diet limiting carbohydrates and sugars Patient will focus on medication adherence by pill box Patient will take medications as prescribed Patient will contact provider with any episodes of hypoglycemia Patient will report any questions or concerns to provider   Initial goal documentation       High Blood Pressure - < 130/80       CARE PLAN ENTRY (see longitudinal plan of care for additional care plan information)  Current Barriers:  Uncontrolled hypertension, complicated by diabetes, hyperlipidemia, and afib. Current antihypertensive regimen: lisinopril 40mg  daily, Toprol XL 25mg  one-half tablet daily, Spironolactone 25mg  daily, Amlodipine 2.5mg  daily Previous antihypertensives tried: none noted Last practice recorded BP readings:  BP Readings from Last 3 Encounters:  01/18/20 124/68  01/06/20 120/66  12/13/19 132/75   Current home BP readings: 120-130/60-70  Pharmacist Clinical Goal(s):  Over the next 180 days, patient will work with PharmD and providers to optimize antihypertensive regimen  Interventions: Inter-disciplinary care team collaboration (see longitudinal plan of care) Comprehensive medication review performed; medication list updated in the electronic medical record.   Patient Self Care Activities:  Patient will continue to check BP twice daily , document, and provide at future appointments Patient will focus on  medication adherence by using a pill box. Over the next 180 days patient will follow up with providers on home BP readings.  Please see past updates related to this goal by clicking on the "Past Updates" button in the selected goal           Plan:    The care management team will reach out to the patient again over the next 180 days.    Beverly Milch, PharmD Clinical Pharmacist [place] [phone]   I have collaborated with the care management provider regarding care management and care  coordination activities outlined in this encounter and have reviewed this encounter including documentation in the note and care plan. I am certifying that I agree with the content of this note and encounter as supervising physician.

## 2020-02-11 ENCOUNTER — Other Ambulatory Visit: Payer: Self-pay | Admitting: Internal Medicine

## 2020-02-14 ENCOUNTER — Encounter: Payer: Self-pay | Admitting: Internal Medicine

## 2020-02-14 ENCOUNTER — Other Ambulatory Visit: Payer: Self-pay

## 2020-02-14 ENCOUNTER — Ambulatory Visit: Payer: PPO | Admitting: Internal Medicine

## 2020-02-14 VITALS — BP 142/64 | HR 58 | Ht 65.0 in | Wt 155.0 lb

## 2020-02-14 DIAGNOSIS — I10 Essential (primary) hypertension: Secondary | ICD-10-CM

## 2020-02-14 DIAGNOSIS — I4819 Other persistent atrial fibrillation: Secondary | ICD-10-CM | POA: Diagnosis not present

## 2020-02-14 DIAGNOSIS — D6869 Other thrombophilia: Secondary | ICD-10-CM

## 2020-02-14 LAB — CUP PACEART INCLINIC DEVICE CHECK
Date Time Interrogation Session: 20210816114821
Implantable Pulse Generator Implant Date: 20190419

## 2020-02-14 NOTE — Patient Instructions (Addendum)
Medication Instructions:  Your physician recommends that you continue on your current medications as directed. Please refer to the Current Medication list given to you today.  *If you need a refill on your cardiac medications before your next appointment, please call your pharmacy*  Lab Work: None ordered.  If you have labs (blood work) drawn today and your tests are completely normal, you will receive your results only by: Marland Kitchen MyChart Message (if you have MyChart) OR . A paper copy in the mail If you have any lab test that is abnormal or we need to change your treatment, we will call you to review the results.  Testing/Procedures: None ordered.  Follow-Up: At [facility], you and your health needs are our priority.  As part of our continuing mission to provide you with exceptional heart care, we have created designated Provider Care Teams.  These Care Teams include your primary Cardiologist (physician) and Advanced Practice Providers (APPs -  Physician Assistants and Nurse Practitioners) who all work together to provide you with the care you need, when you need it.  We recommend signing up for the patient portal called "MyChart".  Sign up information is provided on this After Visit Summary.  MyChart is used to connect with patients for Virtual Visits (Telemedicine).  Patients are able to view lab/test results, encounter notes, upcoming appointments, etc.  Non-urgent messages can be sent to your provider as well.   To learn more about what you can do with MyChart, go to [email or web address].    Your next appointment:   Your physician wants you to follow-up in: 6 months a fib clinic, 1 year with Dr. Rayann Heman. You will receive a reminder letter in the mail two months in advance. If you don't receive a letter, please call our office to schedule the follow-up appointment.    Other Instructions:

## 2020-02-14 NOTE — Progress Notes (Signed)
PCP: [name], MD   Primary EP: Dr Talbot Grumbling Erika Lee is a 74 y.o. female who presents today for routine electrophysiology followup.  Since last being seen in our clinic, the patient reports doing very well.  Her concern today is with shoulder pain.  She is also concerned about costs of eliquis.  Today, she denies symptoms of palpitations, chest pain, shortness of breath,  lower extremity edema, dizziness, presyncope, or syncope.  The patient is otherwise without complaint today.   Past Medical History:  Diagnosis Date  . Anticoagulant long-term use    eliquis  . Chronic sinusitis   . GERD (gastroesophageal reflux disease)   . Hemorrhoids   . History of colitis 10/2016   campylobacter  . Hyperlipidemia   . Hypertension   . Multinodular goiter    per pt has had 2 biopsy's both benign  . NSTEMI (non-ST elevated myocardial infarction) ([place])    12/2018- normal coronary arteries on cath, poss coronary vasospasm.   . Osteoarthritis    "all over"  and AC joint left shoulder  . Paroxysmal atrial fibrillation [facility]) EP cardiologist--  [name]   s/p PVI by [name] 07/16/2013;   pt first dx 2008,  recurrence 2014 afib/flutter and tachy-brady  . Rotator cuff tear, left   . Shoulder impingement, left   . Status post placement of implantable loop recorder    original placement 07-26-2014;  removal / replacement 10-17-2017  by dr   . Type 2 diabetes mellitus ([place])    followed by pcp   Past Surgical History:  Procedure Laterality Date  . ATRIAL FIBRILLATION ABLATION N/A 07/16/2013   PVI and CTI ablation by [name]  . CARDIAC CATHETERIZATION     01/11/2019- Normal coronary arteries.   . COLONOSCOPY  09/10/2011   Dr. Arnoldo Morale: normal, 10 year follow up.  Marland Kitchen LEFT HEART CATH AND CORONARY ANGIOGRAPHY N/A 01/11/2019   Procedure: LEFT HEART CATH AND CORONARY ANGIOGRAPHY;  Surgeon: Lorretta Harp, MD;  Location: [facility] CV LAB;  Service: Cardiovascular;  Laterality: N/A;    . LESION REMOVAL N/A 05/03/2013   Procedure: EXCISION CYST, BACK;  Surgeon: Jamesetta So, MD;  Location: [facility] OR[facility];  Service: General;  Laterality: N/A;  . LOOP RECORDER IMPLANT N/A 07/26/2014   Procedure: LOOP RECORDER IMPLANT;  Surgeon: Thompson Grayer, MD;  Location: [facility] CATH LAB;  Service: Cardiovascular;  Laterality: N/A;  . LOOP RECORDER INSERTION N/A 10/17/2017   MDT previously implanted ILR for afib management at [facility].  old device removed and new device placed by [name]  . LOOP RECORDER REMOVAL N/A 10/17/2017   MDT ILR removed with new device subsequently replaced  . SHOULDER ARTHROSCOPY WITH ROTATOR CUFF REPAIR AND SUBACROMIAL DECOMPRESSION Left 01/07/2019   Procedure: Left shoulder subacromial decompression, distal clavicle resection, extensive debridement,;  Surgeon: Nicholes Stairs, MD;  Location: [facility];  Service: Orthopedics;  Laterality: Left;  . TEE WITHOUT CARDIOVERSION N/A 07/15/2013   Procedure: TRANSESOPHAGEAL ECHOCARDIOGRAM (TEE);  Surgeon: Lelon Perla, MD;  Location: [facility] ENDOSCOPY;  Service: Cardiovascular;  Laterality: N/A;  . TONSILLECTOMY  age 68  . TRANSANAL HEMORRHOIDAL DEARTERIALIZATION N/A 09/22/2015   Procedure: TRANSANAL HEMORRHOIDAL LIGATION/PEXY EUA POSSIBLE HEMORRHOIDECTOMY ;  Surgeon: Michael Boston, MD;  Location: [facility] ORS;  Service: General;  Laterality: N/A;    ROS- all systems are reviewed and negatives except as per HPI above  Current Outpatient Medications  Medication Sig Dispense Refill  . amLODipine (NORVASC)  5 MG tablet Take 0.5 tablets (2.5 mg total) by mouth daily. 90 tablet 3  . amoxicillin-clavulanate (AUGMENTIN) 875-125 MG tablet Take 1 tablet by mouth 2 (two) times daily. 20 tablet 0  . apixaban (ELIQUIS) 5 MG TABS tablet Take 1 tablet (5 mg total) by mouth 2 (two) times daily. 180 tablet 3  . clotrimazole (LOTRIMIN AF) 1 % cream Apply 1 application topically 2 (two) times daily. 30 g 0  . esomeprazole (NEXIUM) 20 MG capsule  Take 20 mg by mouth daily.     . hydrochlorothiazide 25 MG tablet Take 25 mg by mouth daily.     Marland Kitchen HYDROcodone-acetaminophen (NORCO) 10-325 MG tablet Take 1 tablet by mouth every 6 (six) hours as needed. 120 tablet 0  . Lancets (FREESTYLE) lancets 1 each daily.    Marland Kitchen lisinopril (ZESTRIL) 40 MG tablet Take 1 tablet (40 mg total) by mouth daily. 90 tablet 3  . metFORMIN (GLUCOPHAGE) 500 MG tablet TAKE 1 TABLET BY MOUTH EVERY DAY 30 tablet 4  . metoprolol succinate (TOPROL-XL) 25 MG 24 hr tablet TAKE ONE-HALF TABLET BY MOUTH DAILY 45 tablet 3  . NEOMYCIN-POLYMYXIN-HYDROCORTISONE (CORTISPORIN) 1 % SOLN OTIC solution Apply 1-2 drops to toe BID after soaking 10 mL 1  . nitroGLYCERIN (NITROSTAT) 0.4 MG SL tablet PLACE 1 TABLET UNDER THE TONGUE EVERY 5 MINUTES AS NEEDED FOR CHEST PAIN 25 tablet 3  . Potassium Gluconate 550 MG TABS Take 550 mg by mouth at bedtime.     . promethazine (PHENERGAN) 12.5 MG tablet TAKE 1 TABLET(12.5 MG) BY MOUTH EVERY 8 HOURS AS NEEDED FOR NAUSEA OR VOMITING 30 tablet 3  . simvastatin (ZOCOR) 40 MG tablet Take 1 tablet (40 mg total) by mouth at bedtime. 90 tablet 1  . spironolactone (ALDACTONE) 25 MG tablet Take 1 tablet (25 mg total) by mouth daily. 90 tablet 3  . triamcinolone cream (KENALOG) 0.1 % Apply 1 application topically 2 (two) times daily. 45 g 0   No current facility-administered medications for this visit.    Physical Exam: Vitals:   02/14/20 1127  BP: (!) 142/64  Pulse: (!) 58  SpO2: 97%  Weight: 155 lb (70.3 kg)  Height: 5\' 5"  (1.651 m)    GEN- The patient is well appearing, alert and oriented x 3 today.   Head- normocephalic, atraumatic Eyes-  Sclera clear, conjunctiva pink Ears- hearing intact Oropharynx- clear Lungs- Clear to ausculation bilaterally, normal work of breathing Heart- Regular rate and rhythm, no murmurs, rubs or gallops, PMI not laterally displaced GI- soft, NT, ND, + BS Extremities- no clubbing, cyanosis, or edema  Wt Readings  from Last 3 Encounters:  02/14/20 155 lb (70.3 kg)  01/18/20 157 lb (71.2 kg)  01/06/20 161 lb (73 kg)    EKG tracing ordered today is personally reviewed and shows sinus  Assessment and Plan:  1. Persistent afib/ atrial flutter Burden is 0% by ILR She is on eliquis for chads2vasc score of 4 Her primary concern is with cost of eliquis today.  I will fill out patient assistance forms from [facility].  2. HTN Stable No change required today  3. HL Continue statin  Risks, benefits and potential toxicities for medications prescribed and/or refilled reviewed with patient today.   AF clinic in 6 months I will see in a year  Thompson Grayer MD, [facility] 02/14/2020 11:41 AM

## 2020-02-15 ENCOUNTER — Telehealth: Payer: Self-pay

## 2020-02-15 NOTE — Telephone Encounter (Signed)
**Note De-Identified  Obfuscation** The pt left her [place] Pt asst application at the office. I have completed the provider page of the application and emailed all to Dr Jackalyn Lombard nurse so she can obtain his signature, date it, and to fax all to [employer] at fax number written on cover letter included.

## 2020-02-17 NOTE — Telephone Encounter (Signed)
Pt assistance paperwork faxed as requested.

## 2020-02-21 ENCOUNTER — Ambulatory Visit (INDEPENDENT_AMBULATORY_CARE_PROVIDER_SITE_OTHER): Payer: PPO | Admitting: *Deleted

## 2020-02-21 ENCOUNTER — Other Ambulatory Visit: Payer: Self-pay | Admitting: Neurosurgery

## 2020-02-21 DIAGNOSIS — I4819 Other persistent atrial fibrillation: Secondary | ICD-10-CM | POA: Diagnosis not present

## 2020-02-21 DIAGNOSIS — I671 Cerebral aneurysm, nonruptured: Secondary | ICD-10-CM

## 2020-02-21 LAB — CUP PACEART REMOTE DEVICE CHECK
Date Time Interrogation Session: 20210820232509
Implantable Pulse Generator Implant Date: 20190419

## 2020-02-24 NOTE — Progress Notes (Signed)
Carelink Summary Report / Loop Recorder 

## 2020-02-29 ENCOUNTER — Encounter: Payer: Self-pay | Admitting: Podiatry

## 2020-02-29 ENCOUNTER — Ambulatory Visit: Payer: PPO | Admitting: Podiatry

## 2020-02-29 ENCOUNTER — Other Ambulatory Visit: Payer: Self-pay

## 2020-02-29 VITALS — Temp 96.5°F

## 2020-02-29 DIAGNOSIS — L03031 Cellulitis of right toe: Secondary | ICD-10-CM

## 2020-02-29 DIAGNOSIS — M2041 Other hammer toe(s) (acquired), right foot: Secondary | ICD-10-CM

## 2020-02-29 NOTE — Progress Notes (Signed)
She presents today for follow-up of her right nail avulsion and a tenotomy second toe right foot.  She states the tenotomy is doing great the toe is healing very nicely she is very happy with the outcome thus far states that she is just about to complete her Augmentin.  Continues to soak on a daily basis.  Objective: There is no erythema edema cellulitis drainage or odor second toe since rectus.  The ulcerative lesion that was present last visit is no longer present and epithelialization is occurring over the nail bed.  Assessment: Well-healing surgical toe second digit right foot.  Plan: Go ahead and continue her antibiotic and complete that.  I instructed her to soak once every other day and cover during the day but leave open at bedtime.  I will follow-up with her in 2 to 3 weeks just to make sure she is healing well and we will consider flexor tenotomies to toes three four and five at that time.

## 2020-03-02 ENCOUNTER — Other Ambulatory Visit: Payer: Self-pay

## 2020-03-02 ENCOUNTER — Ambulatory Visit
Admission: RE | Admit: 2020-03-02 | Discharge: 2020-03-02 | Disposition: A | Discharge status: Home / Self Care | Payer: PPO | Source: Ambulatory Visit | Attending: Neurosurgery | Admitting: Neurosurgery

## 2020-03-02 DIAGNOSIS — I671 Cerebral aneurysm, nonruptured: Secondary | ICD-10-CM | POA: Diagnosis not present

## 2020-03-02 MED ORDER — IOPAMIDOL (ISOVUE-370) INJECTION 76%
75.0000 mL | Freq: Once | INTRAVENOUS | Status: AC | PRN
  Administered 2020-03-02: 75 mL via INTRAVENOUS

## 2020-03-07 ENCOUNTER — Telehealth: Payer: Self-pay | Admitting: Podiatry

## 2020-03-07 NOTE — Telephone Encounter (Signed)
Pt would like a refill of her antibiotic. She would like it sent to the [facility] on [address], [place].

## 2020-03-08 ENCOUNTER — Other Ambulatory Visit: Payer: Self-pay | Admitting: Podiatry

## 2020-03-08 MED ORDER — AMOXICILLIN-POT CLAVULANATE 875-125 MG PO TABS
1.0000 | ORAL_TABLET | Freq: Two times a day (BID) | ORAL | 0 refills | Status: DC
Start: 2020-03-08 — End: 2020-04-20

## 2020-03-08 NOTE — Telephone Encounter (Signed)
I sent it  

## 2020-03-15 DIAGNOSIS — I671 Cerebral aneurysm, nonruptured: Secondary | ICD-10-CM | POA: Diagnosis not present

## 2020-03-19 ENCOUNTER — Other Ambulatory Visit: Payer: Self-pay | Admitting: Family Medicine

## 2020-03-20 ENCOUNTER — Other Ambulatory Visit: Payer: Self-pay | Admitting: Family Medicine

## 2020-03-20 MED ORDER — HYDROCODONE-ACETAMINOPHEN 10-325 MG PO TABS: 1.0000 | ORAL_TABLET | Freq: Four times a day (QID) | ORAL | 0 refills | Status: AC | PRN

## 2020-03-20 NOTE — Telephone Encounter (Signed)
Ok to refill??  Last office visit 01/18/2020.  Last refill 07/10/2019.

## 2020-03-20 NOTE — Telephone Encounter (Signed)
Patient stopped by requesting refill on her hydrocodone she uses [facility] on [address]  CB# [phone]

## 2020-03-21 ENCOUNTER — Other Ambulatory Visit: Payer: Self-pay

## 2020-03-21 ENCOUNTER — Ambulatory Visit: Payer: PPO | Admitting: Podiatry

## 2020-03-21 DIAGNOSIS — L03031 Cellulitis of right toe: Secondary | ICD-10-CM

## 2020-03-21 NOTE — Progress Notes (Signed)
She presents today for follow-up of her nail procedure to the right toe states it is a little pink but is doing great as she refers to the second toe.  Objective: Vital signs are stable alert oriented x3 there is mild erythema around the proximal nail fold there is no purulence no malodor no tenderness on palpation.  The toe sits relatively rectus and is in good position.  Assessment: Well-healing surgical toe.  Plan: Follow-up with [place] on an as-needed basis.  Continue to take her antibiotic until completely gone.

## 2020-03-26 LAB — CUP PACEART REMOTE DEVICE CHECK
Date Time Interrogation Session: 20210922233539
Implantable Pulse Generator Implant Date: 20190419

## 2020-03-27 ENCOUNTER — Ambulatory Visit (INDEPENDENT_AMBULATORY_CARE_PROVIDER_SITE_OTHER): Payer: PPO | Admitting: Emergency Medicine

## 2020-03-27 DIAGNOSIS — I480 Paroxysmal atrial fibrillation: Secondary | ICD-10-CM

## 2020-03-28 ENCOUNTER — Other Ambulatory Visit: Payer: Self-pay | Admitting: Family Medicine

## 2020-03-29 NOTE — Progress Notes (Signed)
Carelink Summary Report / Loop Recorder 

## 2020-04-06 ENCOUNTER — Other Ambulatory Visit: Payer: Self-pay | Admitting: Family Medicine

## 2020-04-06 MED ORDER — METFORMIN HCL 500 MG PO TABS: 500.0000 mg | ORAL_TABLET | Freq: Every day | ORAL | 4 refills | Status: DC

## 2020-04-06 NOTE — Telephone Encounter (Signed)
     1. Which medications need to be refilled? Metformin, 500 mg tablets take 1 tablet by mouth every day  2. Which pharmacy/location (including street and city if local pharmacy) is medication to be sent to?[facility] on [address] In [place]  3. Do they need a 30 day or 90 day supply? 30 day

## 2020-04-12 ENCOUNTER — Other Ambulatory Visit: Payer: Self-pay | Admitting: Family Medicine

## 2020-04-20 ENCOUNTER — Telehealth: Payer: Self-pay | Admitting: Pharmacist

## 2020-04-20 ENCOUNTER — Other Ambulatory Visit: Payer: Self-pay

## 2020-04-20 ENCOUNTER — Encounter: Payer: Self-pay | Admitting: Family Medicine

## 2020-04-20 ENCOUNTER — Ambulatory Visit (INDEPENDENT_AMBULATORY_CARE_PROVIDER_SITE_OTHER): Payer: PPO | Admitting: Family Medicine

## 2020-04-20 VITALS — BP 122/78 | HR 46 | Temp 97.8°F | Resp 12 | Ht 65.0 in | Wt 158.0 lb

## 2020-04-20 DIAGNOSIS — E118 Type 2 diabetes mellitus with unspecified complications: Secondary | ICD-10-CM

## 2020-04-20 DIAGNOSIS — Z23 Encounter for immunization: Secondary | ICD-10-CM | POA: Diagnosis not present

## 2020-04-20 DIAGNOSIS — I2510 Atherosclerotic heart disease of native coronary artery without angina pectoris: Secondary | ICD-10-CM

## 2020-04-20 DIAGNOSIS — I10 Essential (primary) hypertension: Secondary | ICD-10-CM | POA: Diagnosis not present

## 2020-04-20 DIAGNOSIS — I480 Paroxysmal atrial fibrillation: Secondary | ICD-10-CM

## 2020-04-20 DIAGNOSIS — E785 Hyperlipidemia, unspecified: Secondary | ICD-10-CM

## 2020-04-20 MED ORDER — HYDROCODONE-ACETAMINOPHEN 10-325 MG PO TABS: 1.0000 | ORAL_TABLET | Freq: Four times a day (QID) | ORAL | 0 refills | Status: AC | PRN

## 2020-04-20 MED ORDER — HYDROCHLOROTHIAZIDE 25 MG PO TABS
25.0000 mg | ORAL_TABLET | Freq: Every day | ORAL | 3 refills | Status: DC
Start: 2020-04-20 — End: 2020-06-26

## 2020-04-20 MED ORDER — METFORMIN HCL 500 MG PO TABS
500.0000 mg | ORAL_TABLET | Freq: Every day | ORAL | 3 refills | Status: DC
Start: 2020-04-20 — End: 2020-05-15

## 2020-04-20 NOTE — Progress Notes (Signed)
Chronic Care Management Pharmacy Assistant   Name: Erika Lee  MRN: [id number] DOB: [birth year]  Reason for Encounter: Disease State  Patient Questions:  1.  Have you seen any other providers since your last visit? Yes  2.  Any changes in your medicines or health? Yes    PCP : [name], MD   Their chronic conditions include: Diabetes, hypertension and hyperlipidemia.  Office Visits: 04-20-2020 (PCP) Patient presented in the office with [name] for routine exam. Notes indicate blood pressure was controlled at 122/78. Prescribed HYDROcodone-acetaminophen 10-325 MG tablet for pain. Lab work ordered: Hemoglobin A1c, CBC with Differential/Platelet, COMPLETE METABOLIC PANEL WITH GFR, Lipid panel, Microalbumin, urine.   Consults: 03-21-2020 (Podiatry) Follow up of nail procedure. Patient had no complaints. Was advised to continue antibiotic until completely gone.  02-29-2020 (Podiatry) Follow up of right nail avulsion and tenotomy second toe on the right foot. Patient reports healing nicely and feeling good. Has almost completed her Augmentin and continues to soak her foot on the daily basis.  02-21-2020 (Neuro) OV with Dr. [name].  02-14-2020 (Cardio) Patient presented in the office for a routine electrophysiology follow up c/o shoulder pain. Patient reported financial concern with Eliquis. Dr. Delrae Alfred completed a patient assistance form with Erika Lee on her behalf. No medication changes.  02-10-2020 (Podiatry) Patient presented in the office status post nail avulsion hallux right and flexor tenotomy.  She states that is been hurting a little bit and have not change the dressing at all. Patient was advised to start soaking in Epson salt and warm water, apply neosporin. Unable to rule out some cellulitis or paronychia where the nail was removed.  Augmentin 875 mg twice a day was prescribed to the patient.  Allergies:   Allergies  Allergen Reactions  .  Clindamycin/Lincomycin Rash  . Doxycycline Other (See Comments)    Makes heart race  . Keflex [Cephalexin] Nausea And Vomiting  . Adhesive [Tape] Rash  . Latex Rash  . Sulfonamide Derivatives Nausea And Vomiting    Medications: Outpatient Encounter Medications as of 04/20/2020  Medication Sig  . amLODipine (NORVASC) 5 MG tablet Take 0.5 tablets (2.5 mg total) by mouth daily.  . clotrimazole (LOTRIMIN AF) 1 % cream Apply 1 application topically 2 (two) times daily. (Patient not taking: Reported on 04/20/2020)  . ELIQUIS 5 MG TABS tablet TAKE 1 TABLET BY MOUTH TWICE DAILY  . esomeprazole (NEXIUM) 20 MG capsule Take 20 mg by mouth daily.   . hydrochlorothiazide (HYDRODIURIL) 25 MG tablet Take 1 tablet (25 mg total) by mouth daily.  Marland Kitchen HYDROcodone-acetaminophen (NORCO) 10-325 MG tablet Take 1 tablet by mouth every 6 (six) hours as needed.  . Lancets (FREESTYLE) lancets USE TO CHECK BLOOD SUGAR DAILY  . lisinopril (ZESTRIL) 40 MG tablet Take 1 tablet (40 mg total) by mouth daily.  . metFORMIN (GLUCOPHAGE) 500 MG tablet Take 1 tablet (500 mg total) by mouth daily.  . metoprolol succinate (TOPROL-XL) 25 MG 24 hr tablet TAKE ONE-HALF TABLET BY MOUTH DAILY  . NEOMYCIN-POLYMYXIN-HYDROCORTISONE (CORTISPORIN) 1 % SOLN OTIC solution Apply 1-2 drops to toe BID after soaking (Patient not taking: Reported on 04/20/2020)  . nitroGLYCERIN (NITROSTAT) 0.4 MG SL tablet PLACE 1 TABLET UNDER THE TONGUE EVERY 5 MINUTES AS NEEDED FOR CHEST PAIN  . Potassium Gluconate 550 MG TABS Take 550 mg by mouth at bedtime.   . promethazine (PHENERGAN) 12.5 MG tablet TAKE 1 TABLET(12.5 MG) BY MOUTH EVERY 8 HOURS AS NEEDED FOR NAUSEA  OR VOMITING  . simvastatin (ZOCOR) 40 MG tablet TAKE 1 TABLET(40 MG) BY MOUTH AT BEDTIME  . spironolactone (ALDACTONE) 25 MG tablet Take 1 tablet (25 mg total) by mouth daily.  Marland Kitchen triamcinolone cream (KENALOG) 0.1 % Apply 1 application topically 2 (two) times daily.   No facility-administered  encounter medications on file as of 04/20/2020.    Current Diagnosis: Patient Active Problem List   Diagnosis Date Noted  . Chest pain 01/09/2019  . NSTEMI (non-ST elevated myocardial infarction) ([place]) 01/09/2019  . Localized osteoarthritis of left shoulder 01/07/2019  . Dilation of biliary tract 02/20/2017  . Common bile duct dilatation 02/20/2017  . Nausea with vomiting 02/20/2017  . Campylobacter enteritis 02/20/2017  . Hypotension (arterial) 11/21/2016  . Volume depletion 11/21/2016  . Diabetes mellitus 11/21/2016  . Hyperlipidemia 11/21/2016  . A-fib ([place]) 11/21/2016  . Diarrhea 11/21/2016  . Colitis 11/21/2016  . Acute kidney injury ([place]) 11/21/2016  . Hypokalemia 11/21/2016  . Shortness of breath 07/25/2014  . Influenza due to identified novel influenza A virus with other respiratory manifestations 08/03/2013  . Generalized weakness 07/29/2013  . Cough 07/29/2013  . Hyponatremia 07/28/2013  . Atrial fibrillation ([place]) 07/16/2013  . Atrial flutter ([place]) 07/07/2013  . Sinus bradycardia 06/11/2013  . Tachycardia-bradycardia ([place]) 06/11/2013  . Type 2 diabetes mellitus ([place]) 05/13/2013  . Paroxysmal atrial fibrillation ([place]) 09/03/2010  . Essential hypertension, benign 03/10/2007  . GERD 03/10/2007    Goals Addressed   None    Reviewed chart prior to disease state call. Spoke with patient regarding BP  Recent Office Vitals: BP Readings from Last 3 Encounters:  04/20/20 122/78  02/14/20 (!) 142/64  01/18/20 124/68   Pulse Readings from Last 3 Encounters:  04/20/20 (!) 46  02/14/20 (!) 58  01/18/20 (!) 56    Wt Readings from Last 3 Encounters:  04/20/20 158 lb (71.7 kg)  02/14/20 155 lb (70.3 kg)  01/18/20 157 lb (71.2 kg)     Kidney Function Lab Results  Component Value Date/Time   CREATININE 0.90 01/18/2020 02:34 PM   CREATININE 0.76 12/21/2019 10:31 AM   GFR 72.30 07/22/2013 01:07 PM   GFRNONAA 62 01/18/2020 02:34 PM   GFRAA 72 01/18/2020 02:34  PM    BMP Latest Ref Rng & Units 01/18/2020 12/21/2019 10/19/2019  Glucose 65 - 99 mg/dL 120(H) 128(H) 111(H)  BUN 7 - 25 mg/dL 12 9 8   Creatinine 0.60 - 0.93 mg/dL 0.90 0.76 0.74  BUN/Creat Ratio 6 - 22 (calc) NOT APPLICABLE NOT APPLICABLE NOT APPLICABLE  Sodium 831 - 146 mmol/L 131(L) 133(L) 133(L)  Potassium 3.5 - 5.3 mmol/L 4.5 4.3 3.7  Chloride 98 - 110 mmol/L 96(L) 96(L) 97(L)  CO2 20 - 32 mmol/L 22 29 27   Calcium 8.6 - 10.4 mg/dL 10.4 10.2 9.8    . Current antihypertensive regimen:  o lisinopril 40mg  daily o Metoprolol XL 25 mg one half tab (taking for a-fib) o  Spironolactone 25 mg daily (pateint reports not taking this medication) o  Amlodipine 2.5 mg daily  . How often are you checking your Blood Pressure? daily   . Current home BP readings: Patient said her BP was 130/82 this morning after taking her BP medication.  . What recent interventions/DTPs have been made by any provider to improve Blood Pressure control since last CPP Visit:   . Any recent hospitalizations or ED visits since last visit with CPP? No   . What diet changes have been made to improve Blood Pressure  Control?  o None . What exercise is being done to improve your Blood Pressure Control?  o Patient reports being active daily with errands  Adherence Review: Is the patient currently on ACE/ARB medication? Yes lisinopril 40 MG tablet  Does the patient have >5 day gap between last estimated fill dates? No CPP please confirm   Follow-Up:  Pharmacist Review   Fanny Skates, [facility] Pharmacist Assistant [phone]

## 2020-04-20 NOTE — Progress Notes (Signed)
Subjective:    Patient ID: Erika Lee, female    DOB: 10-Sep-1945, 74 y.o.   MRN: [id number]  Patient is a very pleasant 74 year old Caucasian female who presents today for a checkup.  Past medical history significant for coronary artery disease status post remote myocardial infarction.  She also has a history of atrial fibrillation status post ablation but stays on Eliquis for secondary prevention of stroke.  She also has a history of type 2 diabetes mellitus currently managed with Metformin 500 mg daily.  Her last A1c was acceptable at 6.8 in July.  She also has a history of congestive heart failure with an ejection fraction of 50% on echocardiogram in 2020.  She is on a combination of spironolactone, metoprolol, and lisinopril for congestive heart failure.  She denies any chest pain shortness of breath or dyspnea on exertion.  She does request a refill on her pain medication.  Her blood pressure today is well controlled at 122/78. Past Medical History:  Diagnosis Date  . Anticoagulant long-term use    eliquis  . Chronic sinusitis   . GERD (gastroesophageal reflux disease)   . Hemorrhoids   . History of colitis 10/2016   campylobacter  . Hyperlipidemia   . Hypertension   . Multinodular goiter    per pt has had 2 biopsy's both benign  . NSTEMI (non-ST elevated myocardial infarction) ([place])    12/2018- normal coronary arteries on cath, poss coronary vasospasm.   . Osteoarthritis    "all over"  and AC joint left shoulder  . Paroxysmal atrial fibrillation [facility]) EP cardiologist--  [name]   s/p PVI by [name] 07/16/2013;   pt first dx 2008,  recurrence 2014 afib/flutter and tachy-brady  . Rotator cuff tear, left   . Shoulder impingement, left   . Status post placement of implantable loop recorder    original placement 07-26-2014;  removal / replacement 10-17-2017  by dr allred  . Type 2 diabetes mellitus ([place])    followed by pcp   Past Surgical History:  Procedure Laterality Date   . ATRIAL FIBRILLATION ABLATION N/A 07/16/2013   PVI and CTI ablation by [name]  . CARDIAC CATHETERIZATION     01/11/2019- Normal coronary arteries.   . COLONOSCOPY  09/10/2011   Dr. Arnoldo Morale: normal, 10 year follow up.  Marland Kitchen LEFT HEART CATH AND CORONARY ANGIOGRAPHY N/A 01/11/2019   Procedure: LEFT HEART CATH AND CORONARY ANGIOGRAPHY;  Surgeon: Lorretta Harp, MD;  Location: [place] [place] CV LAB;  Service: Cardiovascular;  Laterality: N/A;  . LESION REMOVAL N/A 05/03/2013   Procedure: EXCISION CYST, BACK;  Surgeon: Jamesetta So, MD;  Location: [facility];  Service: General;  Laterality: N/A;  . LOOP RECORDER IMPLANT N/A 07/26/2014   Procedure: LOOP RECORDER IMPLANT;  Surgeon: Thompson Grayer, MD;  Location: [facility] CATH LAB;  Service: Cardiovascular;  Laterality: N/A;  . LOOP RECORDER INSERTION N/A 10/17/2017   MDT previously implanted ILR for afib management at [facility].  old device removed and new device placed by [name]  . LOOP RECORDER REMOVAL N/A 10/17/2017   MDT ILR removed with new device subsequently replaced  . SHOULDER ARTHROSCOPY WITH ROTATOR CUFF REPAIR AND SUBACROMIAL DECOMPRESSION Left 01/07/2019   Procedure: Left shoulder subacromial decompression, distal clavicle resection, extensive debridement,;  Surgeon: Nicholes Stairs, MD;  Location: [facility];  Service: Orthopedics;  Laterality: Left;  . TEE WITHOUT CARDIOVERSION N/A 07/15/2013   Procedure: TRANSESOPHAGEAL ECHOCARDIOGRAM (TEE);  Surgeon: Denice Bors  Stanford Breed, MD;  Location: [facility];  Service: Cardiovascular;  Laterality: N/A;  . TONSILLECTOMY  age 74  . TRANSANAL HEMORRHOIDAL DEARTERIALIZATION N/A 09/22/2015   Procedure: TRANSANAL HEMORRHOIDAL LIGATION/PEXY EUA POSSIBLE HEMORRHOIDECTOMY ;  Surgeon: Michael Boston, MD;  Location: [facility];  Service: General;  Laterality: N/A;   Current Outpatient Medications on File Prior to Visit  Medication Sig Dispense Refill  . ELIQUIS 5 MG TABS tablet TAKE 1 TABLET BY MOUTH TWICE  DAILY 60 tablet 3  . esomeprazole (NEXIUM) 20 MG capsule Take 20 mg by mouth daily.     . Lancets (FREESTYLE) lancets USE TO CHECK BLOOD SUGAR DAILY 100 each 2  . lisinopril (ZESTRIL) 40 MG tablet Take 1 tablet (40 mg total) by mouth daily. 90 tablet 3  . metoprolol succinate (TOPROL-XL) 25 MG 24 hr tablet TAKE ONE-HALF TABLET BY MOUTH DAILY 45 tablet 3  . nitroGLYCERIN (NITROSTAT) 0.4 MG SL tablet PLACE 1 TABLET UNDER THE TONGUE EVERY 5 MINUTES AS NEEDED FOR CHEST PAIN 25 tablet 3  . Potassium Gluconate 550 MG TABS Take 550 mg by mouth at bedtime.     . promethazine (PHENERGAN) 12.5 MG tablet TAKE 1 TABLET(12.5 MG) BY MOUTH EVERY 8 HOURS AS NEEDED FOR NAUSEA OR VOMITING 30 tablet 3  . simvastatin (ZOCOR) 40 MG tablet TAKE 1 TABLET(40 MG) BY MOUTH AT BEDTIME 90 tablet 1  . triamcinolone cream (KENALOG) 0.1 % Apply 1 application topically 2 (two) times daily. 45 g 0  . amLODipine (NORVASC) 5 MG tablet Take 0.5 tablets (2.5 mg total) by mouth daily. 90 tablet 3  . clotrimazole (LOTRIMIN AF) 1 % cream Apply 1 application topically 2 (two) times daily. (Patient not taking: Reported on 04/20/2020) 30 g 0  . NEOMYCIN-POLYMYXIN-HYDROCORTISONE (CORTISPORIN) 1 % SOLN OTIC solution Apply 1-2 drops to toe BID after soaking (Patient not taking: Reported on 04/20/2020) 10 mL 1  . spironolactone (ALDACTONE) 25 MG tablet Take 1 tablet (25 mg total) by mouth daily. 90 tablet 3   No current facility-administered medications on file prior to visit.   Allergies  Allergen Reactions  . Clindamycin/Lincomycin Rash  . Doxycycline Other (See Comments)    Makes heart race  . Keflex [Cephalexin] Nausea And Vomiting  . Adhesive [Tape] Rash  . Latex Rash  . Sulfonamide Derivatives Nausea And Vomiting   Social History   Socioeconomic History  . Marital status: Married    Spouse name: Not on file  . Number of children: Not on file  . Years of education: Not on file  . Highest education level: Not on file   Occupational History  . Occupation: Pensions consultant: RETIRED    Comment: Full time  Tobacco Use  . Smoking status: Former Smoker    Years: 34.00    Types: Cigarettes    Quit date: 01/02/1994    Years since quitting: 26.3  . Smokeless tobacco: Never Used  Vaping Use  . Vaping Use: Never used  Substance and Sexual Activity  . Alcohol use: No  . Drug use: No  . Sexual activity: Not on file  Other Topics Concern  . Not on file  Social History Narrative   Lives with spouse in [place]   Social Determinants of Health   Financial Resource Strain:   . Difficulty of Paying Living Expenses: Not on file  Food Insecurity:   . Worried About Charity fundraiser in the Last Year: Not on file  . Ran Out of Food  in the Last Year: Not on file  Transportation Needs:   . Lack of Transportation (Medical): Not on file  . Lack of Transportation (Non-Medical): Not on file  Physical Activity:   . Days of Exercise per Week: Not on file  . Minutes of Exercise per Session: Not on file  Stress:   . Feeling of Stress : Not on file  Social Connections:   . Frequency of Communication with Friends and Family: Not on file  . Frequency of Social Gatherings with Friends and Family: Not on file  . Attends Religious Services: Not on file  . Active Member of Clubs or Organizations: Not on file  . Attends Archivist Meetings: Not on file  . Marital Status: Not on file  Intimate Partner Violence:   . Fear of Current or Ex-Partner: Not on file  . Emotionally Abused: Not on file  . Physically Abused: Not on file  . Sexually Abused: Not on file     Review of Systems  All other systems reviewed and are negative.      Objective:   Physical Exam Vitals reviewed.  Constitutional:      General: She is not in acute distress.    Appearance: Normal appearance. She is not ill-appearing or toxic-appearing.  Eyes:     Extraocular Movements: Extraocular movements intact.      Conjunctiva/sclera: Conjunctivae normal.     Pupils: Pupils are equal, round, and reactive to light.  Cardiovascular:     Rate and Rhythm: Normal rate and regular rhythm.     Pulses: Normal pulses.     Heart sounds: Normal heart sounds. No murmur heard.  No friction rub. No gallop.   Pulmonary:     Effort: Pulmonary effort is normal. No respiratory distress.     Breath sounds: Normal breath sounds. No stridor. No wheezing, rhonchi or rales.  Abdominal:     General: Abdomen is flat. Bowel sounds are normal. There is no distension.     Palpations: Abdomen is soft.     Tenderness: There is no abdominal tenderness. There is no guarding.    Musculoskeletal:     Right shoulder: Tenderness present. Decreased range of motion.     Left shoulder: Decreased range of motion.     Cervical back: Neck supple.     Lumbar back: Tenderness present. Decreased range of motion.     Right lower leg: Edema present.     Left lower leg: Edema present.  Skin:    General: Skin is warm.     Coloration: Skin is not jaundiced.     Findings: No bruising, erythema, lesion or rash.  Neurological:     General: No focal deficit present.     Mental Status: She is alert and oriented to person, place, and time.     Cranial Nerves: No cranial nerve deficit.     Sensory: No sensory deficit.     Motor: No weakness.     Coordination: Coordination normal.     Gait: Gait normal.     Deep Tendon Reflexes: Reflexes normal.  Psychiatric:        Mood and Affect: Mood normal.        Thought Content: Thought content normal.           Assessment & Plan:  Controlled type 2 diabetes mellitus with complication, without long-term current use of insulin ([place]) - Plan: Hemoglobin A1c, CBC with Differential/Platelet, COMPLETE METABOLIC PANEL WITH GFR, Lipid panel, Microalbumin, urine  Essential hypertension, benign  Hyperlipidemia, unspecified hyperlipidemia type  Paroxysmal atrial fibrillation (HCC)  CAD in native  artery  Repeat hemoglobin A1c today.  Goal hemoglobin A1c is less than 7.  Blood pressure today is well controlled.  Check CMP to monitor potassium and renal function and also check LDL cholesterol.  Goal LDL cholesterol is less than 70.  Given her history of atrial fibrillation I will also check a CBC to rule out any anemia on her Eliquis and monitor her renal function.  Patient received her flu shot today.

## 2020-04-21 LAB — CBC WITH DIFFERENTIAL/PLATELET
Absolute Monocytes: 638 cells/uL (ref 200–950)
Basophils Absolute: 50 cells/uL (ref 0–200)
Basophils Relative: 0.9 %
Eosinophils Absolute: 269 cells/uL (ref 15–500)
Eosinophils Relative: 4.8 %
HCT: 39.8 % (ref 35.0–45.0)
Hemoglobin: 13.4 g/dL (ref 11.7–15.5)
Lymphs Abs: 2274 cells/uL (ref 850–3900)
MCH: 30 pg (ref 27.0–33.0)
MCHC: 33.7 g/dL (ref 32.0–36.0)
MCV: 89.2 fL (ref 80.0–100.0)
MPV: 10.1 fL (ref 7.5–12.5)
Monocytes Relative: 11.4 %
Neutro Abs: 2369 cells/uL (ref 1500–7800)
Neutrophils Relative %: 42.3 %
Platelets: 206 10*3/uL (ref 140–400)
RBC: 4.46 10*6/uL (ref 3.80–5.10)
RDW: 12.2 % (ref 11.0–15.0)
Total Lymphocyte: 40.6 %
WBC: 5.6 10*3/uL (ref 3.8–10.8)

## 2020-04-21 LAB — COMPLETE METABOLIC PANEL WITH GFR
AG Ratio: 2.1 (calc) (ref 1.0–2.5)
ALT: 10 U/L (ref 6–29)
AST: 12 U/L (ref 10–35)
Albumin: 4.2 g/dL (ref 3.6–5.1)
Alkaline phosphatase (APISO): 68 U/L (ref 37–153)
BUN: 9 mg/dL (ref 7–25)
CO2: 28 mmol/L (ref 20–32)
Calcium: 9.7 mg/dL (ref 8.6–10.4)
Chloride: 99 mmol/L (ref 98–110)
Creat: 0.79 mg/dL (ref 0.60–0.93)
GFR, Est African American: 84 mL/min/{1.73_m2} (ref >=60)
GFR, Est Non African American: 73 mL/min/{1.73_m2} (ref >=60)
Globulin: 2 g/dL (calc) (ref 1.9–3.7)
Glucose, Bld: 120 mg/dL — ABNORMAL HIGH (ref 65–99)
Potassium: 3.9 mmol/L (ref 3.5–5.3)
Sodium: 133 mmol/L — ABNORMAL LOW (ref 135–146)
Total Bilirubin: 0.5 mg/dL (ref 0.2–1.2)
Total Protein: 6.2 g/dL (ref 6.1–8.1)

## 2020-04-21 LAB — HEMOGLOBIN A1C
Hgb A1c MFr Bld: 6.2 % of total Hgb — ABNORMAL HIGH (ref <5.7)
Mean Plasma Glucose: 131 (calc)
eAG (mmol/L): 7.3 (calc)

## 2020-04-21 LAB — MICROALBUMIN, URINE: Microalb, Ur: 0.6 mg/dL

## 2020-04-21 LAB — LIPID PANEL
Cholesterol: 121 mg/dL (ref <200)
HDL: 51 mg/dL (ref >=50)
LDL Cholesterol (Calc): 47 mg/dL (calc)
Non-HDL Cholesterol (Calc): 70 mg/dL (calc) (ref <130)
Total CHOL/HDL Ratio: 2.4 (calc) (ref <5.0)
Triglycerides: 142 mg/dL (ref <150)

## 2020-04-26 NOTE — Telephone Encounter (Signed)
**Note De-Identified  Obfuscation** Letter received from [employer] Pt Asst stating that they have approved the pt for asst with her Eliquis. Approval is valid until 88/67/7373 Application Case#: [id number]  The letter states that they have notified the pt of this approval as well.

## 2020-04-28 ENCOUNTER — Telehealth: Payer: Self-pay | Admitting: Pharmacist

## 2020-04-28 NOTE — Progress Notes (Signed)
Verified Adherence Gap Information. Per insurance data, the patient is 90-99% compliant with the Simvastatin 40 mg medication. Last fill date 10-19-2019 for a 90 day supply. 90-99% compliant with Lisinopril 40 mg medication. Last fill date 02-02-2020 for a 90 day supply. 50-59% compliant with the Metformin-HCTZ 500 mg medication. Last fill date 03-26-2020 for a 60 day supply.  Fanny Skates, [place] Pharmacist Assistant [phone]

## 2020-04-29 LAB — CUP PACEART REMOTE DEVICE CHECK
Date Time Interrogation Session: 20211026000124
Implantable Pulse Generator Implant Date: 20190419

## 2020-05-01 ENCOUNTER — Ambulatory Visit (INDEPENDENT_AMBULATORY_CARE_PROVIDER_SITE_OTHER): Payer: PPO

## 2020-05-01 DIAGNOSIS — I480 Paroxysmal atrial fibrillation: Secondary | ICD-10-CM | POA: Diagnosis not present

## 2020-05-03 NOTE — Progress Notes (Signed)
Carelink Summary Report / Loop Recorder 

## 2020-05-15 ENCOUNTER — Telehealth: Payer: Self-pay | Admitting: Family Medicine

## 2020-05-15 ENCOUNTER — Telehealth: Payer: Self-pay

## 2020-05-15 ENCOUNTER — Other Ambulatory Visit: Payer: PPO

## 2020-05-15 ENCOUNTER — Other Ambulatory Visit: Payer: Self-pay

## 2020-05-15 ENCOUNTER — Other Ambulatory Visit: Payer: Self-pay | Admitting: Family Medicine

## 2020-05-15 DIAGNOSIS — N390 Urinary tract infection, site not specified: Secondary | ICD-10-CM

## 2020-05-15 MED ORDER — NITROFURANTOIN MONOHYD MACRO 100 MG PO CAPS: 100.0000 mg | ORAL_CAPSULE | Freq: Two times a day (BID) | ORAL | 0 refills | Status: DC

## 2020-05-15 MED ORDER — METFORMIN HCL 500 MG PO TABS
500.0000 mg | ORAL_TABLET | Freq: Every day | ORAL | 3 refills | Status: DC
Start: 2020-05-15 — End: 2021-04-30

## 2020-05-15 NOTE — Telephone Encounter (Signed)
Called Pt informed her Provider sent out abx

## 2020-05-15 NOTE — Telephone Encounter (Signed)
has bladder infection would like to be seen no appts until Friday would it be okay to drop off a urine sample

## 2020-05-15 NOTE — Telephone Encounter (Signed)
I'll just call out abx.

## 2020-05-15 NOTE — Telephone Encounter (Signed)
Refill Metformin.

## 2020-05-15 NOTE — Telephone Encounter (Signed)
Pt rx sent to pharmacy

## 2020-05-15 NOTE — Telephone Encounter (Signed)
Pt called  with UTI symptoms, asked if she could come in for urine sample. Burning when urinating, discomfort at bottom of stomach and when she urinates. Ok to come for urine sample ?

## 2020-05-15 NOTE — Telephone Encounter (Signed)
Pt coming in on lab visit to give urine specimen.

## 2020-05-16 ENCOUNTER — Other Ambulatory Visit: Payer: Self-pay

## 2020-05-16 ENCOUNTER — Other Ambulatory Visit: Payer: Self-pay | Admitting: Family Medicine

## 2020-05-16 DIAGNOSIS — N390 Urinary tract infection, site not specified: Secondary | ICD-10-CM

## 2020-05-16 MED ORDER — CIPROFLOXACIN HCL 250 MG PO TABS: 250.0000 mg | ORAL_TABLET | Freq: Two times a day (BID) | ORAL | 0 refills | Status: DC

## 2020-05-17 LAB — URINALYSIS, ROUTINE W REFLEX MICROSCOPIC
Bilirubin Urine: NEGATIVE
Glucose, UA: NEGATIVE
Hyaline Cast: NONE SEEN /LPF
Ketones, ur: NEGATIVE
Nitrite: POSITIVE — AB
Specific Gravity, Urine: 1.015 (ref 1.001–1.03)
WBC, UA: >=60 /HPF — AB (ref 0–5)
pH: 6 (ref 5.0–8.0)

## 2020-05-17 LAB — URINE CULTURE
MICRO NUMBER:: 11203380
SPECIMEN QUALITY:: ADEQUATE

## 2020-05-22 ENCOUNTER — Other Ambulatory Visit: Payer: Self-pay | Admitting: Family Medicine

## 2020-05-22 ENCOUNTER — Ambulatory Visit: Payer: Self-pay | Admitting: Pharmacist

## 2020-05-22 MED ORDER — HYDROCODONE-ACETAMINOPHEN 10-325 MG PO TABS
1.0000 | ORAL_TABLET | Freq: Three times a day (TID) | ORAL | 0 refills | Status: DC | PRN
Start: 2020-05-22 — End: 2020-06-22

## 2020-05-22 NOTE — Telephone Encounter (Signed)
Refill- Hydrocodone

## 2020-05-22 NOTE — Chronic Care Management (AMB) (Addendum)
Chronic Care Management   Follow Up Note   05/22/2020 Name: Erika Lee MRN: [id number] DOB: 03/30/1946  Referred by: [name], MD Reason for referral : No chief complaint on file.   Erika Lee is a 74 y.o. year old female who is a primary care patient of Pickard, Cammie Mcgee, MD. The CCM team was consulted for assistance with chronic disease management and care coordination needs.    Review of patient status, including review of consultants reports, relevant laboratory and other test results, and collaboration with appropriate care team members and the patient's provider was performed as part of comprehensive patient evaluation and provision of chronic care management services.    SDOH (Social Determinants of Health) assessments performed: No See Care Plan activities for detailed interventions related to [facility])      Outpatient Encounter Medications as of 05/22/2020  Medication Sig   amLODipine (NORVASC) 5 MG tablet Take 0.5 tablets (2.5 mg total) by mouth daily.   ciprofloxacin (CIPRO) 250 MG tablet Take 1 tablet (250 mg total) by mouth 2 (two) times daily.   clotrimazole (LOTRIMIN AF) 1 % cream Apply 1 application topically 2 (two) times daily. (Patient not taking: Reported on 04/20/2020)   ELIQUIS 5 MG TABS tablet TAKE 1 TABLET BY MOUTH TWICE DAILY   esomeprazole (NEXIUM) 20 MG capsule Take 20 mg by mouth daily.    hydrochlorothiazide (HYDRODIURIL) 25 MG tablet Take 1 tablet (25 mg total) by mouth daily.   Lancets (FREESTYLE) lancets USE TO CHECK BLOOD SUGAR DAILY   lisinopril (ZESTRIL) 40 MG tablet Take 1 tablet (40 mg total) by mouth daily.   metFORMIN (GLUCOPHAGE) 500 MG tablet Take 1 tablet (500 mg total) by mouth daily.   metoprolol succinate (TOPROL-XL) 25 MG 24 hr tablet TAKE ONE-HALF TABLET BY MOUTH DAILY   NEOMYCIN-POLYMYXIN-HYDROCORTISONE (CORTISPORIN) 1 % SOLN OTIC solution Apply 1-2 drops to toe BID after soaking (Patient not taking: Reported on 04/20/2020)    nitrofurantoin, macrocrystal-monohydrate, (MACROBID) 100 MG capsule Take 1 capsule (100 mg total) by mouth 2 (two) times daily.   nitroGLYCERIN (NITROSTAT) 0.4 MG SL tablet PLACE 1 TABLET UNDER THE TONGUE EVERY 5 MINUTES AS NEEDED FOR CHEST PAIN   Potassium Gluconate 550 MG TABS Take 550 mg by mouth at bedtime.    promethazine (PHENERGAN) 12.5 MG tablet TAKE 1 TABLET(12.5 MG) BY MOUTH EVERY 8 HOURS AS NEEDED FOR NAUSEA OR VOMITING   simvastatin (ZOCOR) 40 MG tablet TAKE 1 TABLET(40 MG) BY MOUTH AT BEDTIME   spironolactone (ALDACTONE) 25 MG tablet Take 1 tablet (25 mg total) by mouth daily.   triamcinolone cream (KENALOG) 0.1 % Apply 1 application topically 2 (two) times daily.   No facility-administered encounter medications on file as of 05/22/2020.     Objective:  CCM Adherence review for medication Metformin 500mg . Goals Addressed   None     There are no care plans to display for this patient.  Medication sent in for refill on 11/15. Confirmed with pharmacy it was picked up that day for 90 days supply, which carries patient past the end of the year.  [place] year to date 72.5% increasing over the last few months of the year.  Plan:   The patient has been provided with contact information for the care management team and has been advised to call with any health related questions or concerns.    Beverly Milch, PharmD Clinical Pharmacist [place] [phone]  I have collaborated with the care  management provider regarding care management and care coordination activities outlined in this encounter and have reviewed this encounter including documentation in the note and care plan. I am certifying that I agree with the content of this note and encounter as supervising physician.

## 2020-05-22 NOTE — Telephone Encounter (Signed)
Reviewed chart, on norco 10-325 QID prn, filled last in Oct

## 2020-05-23 ENCOUNTER — Other Ambulatory Visit: Payer: Self-pay

## 2020-05-23 ENCOUNTER — Other Ambulatory Visit: Payer: PPO

## 2020-05-23 ENCOUNTER — Telehealth: Payer: Self-pay | Admitting: *Deleted

## 2020-05-23 DIAGNOSIS — R319 Hematuria, unspecified: Secondary | ICD-10-CM | POA: Diagnosis not present

## 2020-05-23 DIAGNOSIS — R300 Dysuria: Secondary | ICD-10-CM

## 2020-05-23 DIAGNOSIS — N390 Urinary tract infection, site not specified: Secondary | ICD-10-CM

## 2020-05-23 MED ORDER — CIPROFLOXACIN HCL 250 MG PO TABS: 250.0000 mg | ORAL_TABLET | Freq: Two times a day (BID) | ORAL | 0 refills | Status: AC

## 2020-05-23 NOTE — Telephone Encounter (Signed)
Pt given Cipro for 3 days Recommend extending antibiotics due to her other allergies, she had E coli pansensitive   Send in Cipro 250mg  BID x 5 days

## 2020-05-23 NOTE — Telephone Encounter (Signed)
Patient in office to discuss UTI.   States that she has been treated with Bactrim, but was unable to tolerate. Reports that she was given Cipro, but Sx have not resolved.   Advised to leave another sample for UA C&S. Orders placed.

## 2020-05-23 NOTE — Telephone Encounter (Signed)
Of note, patient had issues with Macrobid.   Call placed to patient and patient made aware.   Prescription sent to pharmacy.

## 2020-05-23 NOTE — Telephone Encounter (Signed)
Rx refill sent to provider 

## 2020-05-24 LAB — URINE CULTURE
MICRO NUMBER:: 11238638
Result:: NO GROWTH
SPECIMEN QUALITY:: ADEQUATE

## 2020-05-24 LAB — URINALYSIS, ROUTINE W REFLEX MICROSCOPIC
Bacteria, UA: NONE SEEN /HPF
Bilirubin Urine: NEGATIVE
Glucose, UA: NEGATIVE
Hgb urine dipstick: NEGATIVE
Hyaline Cast: NONE SEEN /LPF
Ketones, ur: NEGATIVE
Nitrite: NEGATIVE
Protein, ur: NEGATIVE
RBC / HPF: NONE SEEN /HPF (ref 0–2)
Specific Gravity, Urine: 1.007 (ref 1.001–1.03)
Squamous Epithelial / HPF: NONE SEEN /HPF (ref <=5)
WBC, UA: NONE SEEN /HPF (ref 0–5)
pH: 6.5 (ref 5.0–8.0)

## 2020-06-02 LAB — CUP PACEART REMOTE DEVICE CHECK
Date Time Interrogation Session: 20211127231239
Implantable Pulse Generator Implant Date: 20190419

## 2020-06-05 ENCOUNTER — Ambulatory Visit (INDEPENDENT_AMBULATORY_CARE_PROVIDER_SITE_OTHER): Payer: PPO

## 2020-06-05 DIAGNOSIS — I480 Paroxysmal atrial fibrillation: Secondary | ICD-10-CM | POA: Diagnosis not present

## 2020-06-14 NOTE — Progress Notes (Signed)
Carelink Summary Report / Loop Recorder 

## 2020-06-22 ENCOUNTER — Telehealth: Payer: Self-pay | Admitting: Family Medicine

## 2020-06-22 ENCOUNTER — Other Ambulatory Visit: Payer: Self-pay | Admitting: Family Medicine

## 2020-06-22 MED ORDER — HYDROCODONE-ACETAMINOPHEN 10-325 MG PO TABS: 1.0000 | ORAL_TABLET | Freq: Three times a day (TID) | ORAL | 0 refills | Status: DC | PRN

## 2020-06-22 NOTE — Telephone Encounter (Signed)
Refill hydrocodone  

## 2020-06-22 NOTE — Telephone Encounter (Signed)
Patient requesting a refill on her hydrocodone called into [facility].  CB# [phone]

## 2020-06-22 NOTE — Telephone Encounter (Signed)
To PCP

## 2020-06-22 NOTE — Telephone Encounter (Signed)
PCP refilled 

## 2020-06-23 ENCOUNTER — Other Ambulatory Visit: Payer: Self-pay | Admitting: Family Medicine

## 2020-07-09 LAB — CUP PACEART REMOTE DEVICE CHECK
Date Time Interrogation Session: 20220108231034
Implantable Pulse Generator Implant Date: 20190419

## 2020-07-10 ENCOUNTER — Ambulatory Visit (INDEPENDENT_AMBULATORY_CARE_PROVIDER_SITE_OTHER): Payer: PPO

## 2020-07-10 DIAGNOSIS — I480 Paroxysmal atrial fibrillation: Secondary | ICD-10-CM

## 2020-07-23 NOTE — Progress Notes (Signed)
Carelink Summary Report / Loop Recorder 

## 2020-07-24 ENCOUNTER — Encounter: Payer: Self-pay | Admitting: Family Medicine

## 2020-07-24 ENCOUNTER — Telehealth: Payer: Self-pay

## 2020-07-24 ENCOUNTER — Ambulatory Visit (INDEPENDENT_AMBULATORY_CARE_PROVIDER_SITE_OTHER): Payer: PPO | Admitting: Family Medicine

## 2020-07-24 ENCOUNTER — Other Ambulatory Visit: Payer: Self-pay

## 2020-07-24 VITALS — BP 114/60 | Temp 98.8°F | Ht 65.0 in | Wt 160.0 lb

## 2020-07-24 DIAGNOSIS — E118 Type 2 diabetes mellitus with unspecified complications: Secondary | ICD-10-CM

## 2020-07-24 DIAGNOSIS — E785 Hyperlipidemia, unspecified: Secondary | ICD-10-CM | POA: Diagnosis not present

## 2020-07-24 DIAGNOSIS — I480 Paroxysmal atrial fibrillation: Secondary | ICD-10-CM

## 2020-07-24 DIAGNOSIS — I10 Essential (primary) hypertension: Secondary | ICD-10-CM

## 2020-07-24 DIAGNOSIS — I2510 Atherosclerotic heart disease of native coronary artery without angina pectoris: Secondary | ICD-10-CM

## 2020-07-24 MED ORDER — BLOOD GLUCOSE TEST VI STRP: ORAL_STRIP | 11 refills | Status: DC

## 2020-07-24 MED ORDER — PROMETHAZINE HCL 12.5 MG PO TABS: 12.5000 mg | ORAL_TABLET | Freq: Four times a day (QID) | ORAL | 3 refills | Status: DC | PRN

## 2020-07-24 MED ORDER — LISINOPRIL 40 MG PO TABS: 40.0000 mg | ORAL_TABLET | Freq: Every day | ORAL | 3 refills | Status: DC

## 2020-07-24 MED ORDER — HYDROCODONE-ACETAMINOPHEN 10-325 MG PO TABS
1.0000 | ORAL_TABLET | Freq: Four times a day (QID) | ORAL | 0 refills | Status: DC | PRN
Start: 2020-07-24 — End: 2020-08-25

## 2020-07-24 NOTE — Progress Notes (Signed)
Subjective:    Patient ID: Erika Lee, female    DOB: 1946/02/08, 75 y.o.   MRN: [id number]  Patient is a very pleasant 75 year old Caucasian female who presents today for a checkup.  Past medical history significant for coronary artery disease status post remote myocardial infarction.  She also has a history of atrial fibrillation status post ablation but stays on Eliquis for secondary prevention of stroke.  She also has a history of type 2 diabetes mellitus currently managed with Metformin 500 mg daily.  She also has a history of congestive heart failure with an ejection fraction of 50% on echocardiogram in 2020.  She is on a combination of spironolactone, metoprolol, and lisinopril for congestive heart failure. Patient request a refill on her pain medication. She is taking the pain medication every 6 hours. Therefore she needs 120 tablets a month. She denies any constipation. She denies any falls or dizziness. She continues to have severe low back pain as well as bilateral knee pain. Blood pressure today is outstanding. She is due for fasting lab work. Past Medical History:  Diagnosis Date  . Anticoagulant long-term use    eliquis  . Chronic sinusitis   . GERD (gastroesophageal reflux disease)   . Hemorrhoids   . History of colitis 10/2016   campylobacter  . Hyperlipidemia   . Hypertension   . Multinodular goiter    per pt has had 2 biopsy's both benign  . NSTEMI (non-ST elevated myocardial infarction) ([place])    12/2018- normal coronary arteries on cath, poss coronary vasospasm.   . Osteoarthritis    "all over"  and AC joint left shoulder  . Paroxysmal atrial fibrillation [facility]) EP cardiologist--  [name]   s/p PVI by [name] 07/16/2013;   pt first dx 2008,  recurrence 2014 afib/flutter and tachy-brady  . Rotator cuff tear, left   . Shoulder impingement, left   . Status post placement of implantable loop recorder    original placement 07-26-2014;  removal / replacement 10-17-2017   by dr allred  . Type 2 diabetes mellitus ([place])    followed by pcp   Past Surgical History:  Procedure Laterality Date  . ATRIAL FIBRILLATION ABLATION N/A 07/16/2013   PVI and CTI ablation by [name]  . CARDIAC CATHETERIZATION     01/11/2019- Normal coronary arteries.   . COLONOSCOPY  09/10/2011   Dr. Arnoldo Morale: normal, 10 year follow up.  Marland Kitchen LEFT HEART CATH AND CORONARY ANGIOGRAPHY N/A 01/11/2019   Procedure: LEFT HEART CATH AND CORONARY ANGIOGRAPHY;  Surgeon: Lorretta Harp, MD;  Location: [place] CV LAB;  Service: Cardiovascular;  Laterality: N/A;  . LESION REMOVAL N/A 05/03/2013   Procedure: EXCISION CYST, BACK;  Surgeon: Jamesetta So, MD;  Location: [facility];  Service: General;  Laterality: N/A;  . LOOP RECORDER IMPLANT N/A 07/26/2014   Procedure: LOOP RECORDER IMPLANT;  Surgeon: Thompson Grayer, MD;  Location: [facility] CATH LAB;  Service: Cardiovascular;  Laterality: N/A;  . LOOP RECORDER INSERTION N/A 10/17/2017   MDT previously implanted ILR for afib management at [facility].  old device removed and new device placed by [name]  . LOOP RECORDER REMOVAL N/A 10/17/2017   MDT ILR removed with new device subsequently replaced  . SHOULDER ARTHROSCOPY WITH ROTATOR CUFF REPAIR AND SUBACROMIAL DECOMPRESSION Left 01/07/2019   Procedure: Left shoulder subacromial decompression, distal clavicle resection, extensive debridement,;  Surgeon: Nicholes Stairs, MD;  Location: [facility];  Service: Orthopedics;  Laterality: Left;  .  TEE WITHOUT CARDIOVERSION N/A 07/15/2013   Procedure: TRANSESOPHAGEAL ECHOCARDIOGRAM (TEE);  Surgeon: Lelon Perla, MD;  Location: [facility] ENDOSCOPY;  Service: Cardiovascular;  Laterality: N/A;  . TONSILLECTOMY  age 75  . TRANSANAL HEMORRHOIDAL DEARTERIALIZATION N/A 09/22/2015   Procedure: TRANSANAL HEMORRHOIDAL LIGATION/PEXY EUA POSSIBLE HEMORRHOIDECTOMY ;  Surgeon: Michael Boston, MD;  Location: [facility];  Service: General;  Laterality: N/A;   Current Outpatient  Medications on File Prior to Visit  Medication Sig Dispense Refill  . amLODipine (NORVASC) 5 MG tablet Take 0.5 tablets (2.5 mg total) by mouth daily. 90 tablet 3  . clotrimazole (LOTRIMIN AF) 1 % cream Apply 1 application topically 2 (two) times daily. (Patient not taking: Reported on 04/20/2020) 30 g 0  . ELIQUIS 5 MG TABS tablet TAKE 1 TABLET BY MOUTH TWICE DAILY 60 tablet 3  . esomeprazole (NEXIUM) 20 MG capsule Take 20 mg by mouth daily.     . hydrochlorothiazide (HYDRODIURIL) 25 MG tablet TAKE 1 TABLET BY MOUTH EVERY MORNING FOR FLUID RETENTION 90 tablet 3  . HYDROcodone-acetaminophen (NORCO) 10-325 MG tablet Take 1 tablet by mouth every 8 (eight) hours as needed. 120 tablet 0  . Lancets (FREESTYLE) lancets USE TO CHECK BLOOD SUGAR DAILY 100 each 2  . lisinopril (ZESTRIL) 40 MG tablet Take 1 tablet (40 mg total) by mouth daily. 90 tablet 3  . metFORMIN (GLUCOPHAGE) 500 MG tablet Take 1 tablet (500 mg total) by mouth daily. 90 tablet 3  . metoprolol succinate (TOPROL-XL) 25 MG 24 hr tablet TAKE ONE-HALF TABLET BY MOUTH DAILY 45 tablet 3  . NEOMYCIN-POLYMYXIN-HYDROCORTISONE (CORTISPORIN) 1 % SOLN OTIC solution Apply 1-2 drops to toe BID after soaking (Patient not taking: Reported on 04/20/2020) 10 mL 1  . nitroGLYCERIN (NITROSTAT) 0.4 MG SL tablet PLACE 1 TABLET UNDER THE TONGUE EVERY 5 MINUTES AS NEEDED FOR CHEST PAIN 25 tablet 3  . Potassium Gluconate 550 MG TABS Take 550 mg by mouth at bedtime.     . promethazine (PHENERGAN) 12.5 MG tablet TAKE 1 TABLET(12.5 MG) BY MOUTH EVERY 8 HOURS AS NEEDED FOR NAUSEA OR VOMITING 30 tablet 3  . simvastatin (ZOCOR) 40 MG tablet TAKE 1 TABLET(40 MG) BY MOUTH AT BEDTIME 90 tablet 1  . spironolactone (ALDACTONE) 25 MG tablet Take 1 tablet (25 mg total) by mouth daily. 90 tablet 3  . triamcinolone cream (KENALOG) 0.1 % Apply 1 application topically 2 (two) times daily. 45 g 0   No current facility-administered medications on file prior to visit.    Allergies  Allergen Reactions  . Clindamycin/Lincomycin Rash  . Doxycycline Other (See Comments)    Makes heart race  . Keflex [Cephalexin] Nausea And Vomiting  . Adhesive [Tape] Rash  . Latex Rash  . Sulfonamide Derivatives Nausea And Vomiting   Social History   Socioeconomic History  . Marital status: Married    Spouse name: Not on file  . Number of children: Not on file  . Years of education: Not on file  . Highest education level: Not on file  Occupational History  . Occupation: Pensions consultant: RETIRED    Comment: Full time  Tobacco Use  . Smoking status: Former Smoker    Years: 34.00    Types: Cigarettes    Quit date: 01/02/1994    Years since quitting: 26.5  . Smokeless tobacco: Never Used  Vaping Use  . Vaping Use: Never used  Substance and Sexual Activity  . Alcohol use: No  . Drug use:  No  . Sexual activity: Not on file  Other Topics Concern  . Not on file  Social History Narrative   Lives with spouse in [place]   Social Determinants of Health   Financial Resource Strain: Not on file  Food Insecurity: Not on file  Transportation Needs: Not on file  Physical Activity: Not on file  Stress: Not on file  Social Connections: Not on file  Intimate Partner Violence: Not on file     Review of Systems  All other systems reviewed and are negative.      Objective:   Physical Exam Vitals reviewed.  Constitutional:      General: She is not in acute distress.    Appearance: Normal appearance. She is not ill-appearing or toxic-appearing.  Eyes:     Extraocular Movements: Extraocular movements intact.     Conjunctiva/sclera: Conjunctivae normal.     Pupils: Pupils are equal, round, and reactive to light.  Cardiovascular:     Rate and Rhythm: Normal rate and regular rhythm.     Pulses: Normal pulses.     Heart sounds: Normal heart sounds. No murmur heard. No friction rub. No gallop.   Pulmonary:     Effort: Pulmonary effort is  normal. No respiratory distress.     Breath sounds: Normal breath sounds. No stridor. No wheezing, rhonchi or rales.  Abdominal:     General: Abdomen is flat. Bowel sounds are normal. There is no distension.     Palpations: Abdomen is soft.     Tenderness: There is no abdominal tenderness. There is no guarding.  Musculoskeletal:     Right shoulder: Tenderness present. Decreased range of motion.     Left shoulder: Decreased range of motion.     Cervical back: Neck supple.     Lumbar back: Tenderness present. Decreased range of motion.     Right lower leg: Edema present.     Left lower leg: Edema present.  Skin:    General: Skin is warm.     Coloration: Skin is not jaundiced.     Findings: No bruising, erythema, lesion or rash.  Neurological:     General: No focal deficit present.     Mental Status: She is alert and oriented to person, place, and time.     Cranial Nerves: No cranial nerve deficit.     Sensory: No sensory deficit.     Motor: No weakness.     Coordination: Coordination normal.     Gait: Gait normal.     Deep Tendon Reflexes: Reflexes normal.  Psychiatric:        Mood and Affect: Mood normal.        Thought Content: Thought content normal.           Assessment & Plan:  Controlled type 2 diabetes mellitus with complication, without long-term current use of insulin (HCC) - Plan: Hemoglobin A1c, CBC with Differential/Platelet, COMPLETE METABOLIC PANEL WITH GFR, Lipid panel, Microalbumin, urine  CAD in native artery  Essential hypertension, benign  Paroxysmal atrial fibrillation (HCC)  Hyperlipidemia, unspecified hyperlipidemia type  I refilled the patient's pain medication 1 tablet every 6 hours, 120 tablets/month. I have no concern about abuse or diversion. Her blood pressure today is well controlled. Regarding her diabetes I will check an A1c. Goal A1c is less than 6.5. I will also check a urine microalbumin. Goal urine albumin to creatinine ratio is less  than 30. I will check a fasting lipid panel. Goal LDL cholesterol is less  than 70 given her history of coronary artery disease. Today she is in normal sinus rhythm however she is appropriately anticoagulated on Eliquis.

## 2020-07-24 NOTE — Addendum Note (Signed)
Addended by: Sheral Flow on: 07/24/2020 01:13 PM   Modules accepted: Orders

## 2020-07-24 NOTE — Telephone Encounter (Signed)
Erika Lee has come back to the office to make sure her BP cuff is working properly. Her manual readings in the office today were good an her home reading are about 15-20 points off. Pt last manual reading in the office is 118/60 and her reader was 143/85. Pt also forgot to get her labs drawn this am so she just got them drawn when she came back to the office this time around.

## 2020-07-25 LAB — COMPLETE METABOLIC PANEL WITH GFR
AG Ratio: 2.2 (calc) (ref 1.0–2.5)
ALT: 11 U/L (ref 6–29)
AST: 17 U/L (ref 10–35)
Albumin: 4.3 g/dL (ref 3.6–5.1)
Alkaline phosphatase (APISO): 67 U/L (ref 37–153)
BUN: 8 mg/dL (ref 7–25)
CO2: 27 mmol/L (ref 20–32)
Calcium: 10.1 mg/dL (ref 8.6–10.4)
Chloride: 99 mmol/L (ref 98–110)
Creat: 0.77 mg/dL (ref 0.60–0.93)
GFR, Est African American: 87 mL/min/{1.73_m2} (ref >=60)
GFR, Est Non African American: 75 mL/min/{1.73_m2} (ref >=60)
Globulin: 2 g/dL (calc) (ref 1.9–3.7)
Glucose, Bld: 155 mg/dL — ABNORMAL HIGH (ref 65–99)
Potassium: 3.8 mmol/L (ref 3.5–5.3)
Sodium: 134 mmol/L — ABNORMAL LOW (ref 135–146)
Total Bilirubin: 0.5 mg/dL (ref 0.2–1.2)
Total Protein: 6.3 g/dL (ref 6.1–8.1)

## 2020-07-25 LAB — CBC WITH DIFFERENTIAL/PLATELET
Absolute Monocytes: 747 cells/uL (ref 200–950)
Basophils Absolute: 39 cells/uL (ref 0–200)
Basophils Relative: 0.5 %
Eosinophils Absolute: 300 cells/uL (ref 15–500)
Eosinophils Relative: 3.9 %
HCT: 41.1 % (ref 35.0–45.0)
Hemoglobin: 14.1 g/dL (ref 11.7–15.5)
Lymphs Abs: 2795 cells/uL (ref 850–3900)
MCH: 29.7 pg (ref 27.0–33.0)
MCHC: 34.3 g/dL (ref 32.0–36.0)
MCV: 86.7 fL (ref 80.0–100.0)
MPV: 10.1 fL (ref 7.5–12.5)
Monocytes Relative: 9.7 %
Neutro Abs: 3819 cells/uL (ref 1500–7800)
Neutrophils Relative %: 49.6 %
Platelets: 207 10*3/uL (ref 140–400)
RBC: 4.74 10*6/uL (ref 3.80–5.10)
RDW: 12.4 % (ref 11.0–15.0)
Total Lymphocyte: 36.3 %
WBC: 7.7 10*3/uL (ref 3.8–10.8)

## 2020-07-25 LAB — LIPID PANEL
Cholesterol: 124 mg/dL (ref <200)
HDL: 48 mg/dL — ABNORMAL LOW (ref >=50)
LDL Cholesterol (Calc): 55 mg/dL (calc)
Non-HDL Cholesterol (Calc): 76 mg/dL (calc) (ref <130)
Total CHOL/HDL Ratio: 2.6 (calc) (ref <5.0)
Triglycerides: 120 mg/dL (ref <150)

## 2020-07-25 LAB — MICROALBUMIN, URINE: Microalb, Ur: 0.3 mg/dL

## 2020-07-25 LAB — HEMOGLOBIN A1C
Hgb A1c MFr Bld: 6.6 % of total Hgb — ABNORMAL HIGH (ref <5.7)
Mean Plasma Glucose: 143 mg/dL
eAG (mmol/L): 7.9 mmol/L

## 2020-07-26 ENCOUNTER — Encounter: Payer: Self-pay | Admitting: *Deleted

## 2020-08-11 NOTE — Progress Notes (Signed)
  ERROR - visit cancelled by patient

## 2020-08-12 LAB — CUP PACEART REMOTE DEVICE CHECK
Date Time Interrogation Session: 20220210232919
Implantable Pulse Generator Implant Date: 20190419

## 2020-08-14 ENCOUNTER — Ambulatory Visit (INDEPENDENT_AMBULATORY_CARE_PROVIDER_SITE_OTHER): Payer: PPO

## 2020-08-14 ENCOUNTER — Ambulatory Visit: Payer: Self-pay

## 2020-08-14 DIAGNOSIS — I480 Paroxysmal atrial fibrillation: Secondary | ICD-10-CM | POA: Diagnosis not present

## 2020-08-16 ENCOUNTER — Encounter (HOSPITAL_COMMUNITY): Payer: Self-pay | Admitting: Nurse Practitioner

## 2020-08-16 ENCOUNTER — Ambulatory Visit (HOSPITAL_COMMUNITY)
Admission: RE | Admit: 2020-08-16 | Discharge: 2020-08-16 | Disposition: A | Discharge status: Home / Self Care | Payer: PPO | Source: Ambulatory Visit | Attending: Nurse Practitioner | Admitting: Nurse Practitioner

## 2020-08-16 ENCOUNTER — Other Ambulatory Visit: Payer: Self-pay

## 2020-08-16 VITALS — BP 138/68 | HR 48 | Ht 65.0 in | Wt 158.4 lb

## 2020-08-16 DIAGNOSIS — Z7984 Long term (current) use of oral hypoglycemic drugs: Secondary | ICD-10-CM | POA: Diagnosis not present

## 2020-08-16 DIAGNOSIS — I480 Paroxysmal atrial fibrillation: Secondary | ICD-10-CM

## 2020-08-16 DIAGNOSIS — Z87891 Personal history of nicotine dependence: Secondary | ICD-10-CM | POA: Insufficient documentation

## 2020-08-16 DIAGNOSIS — Z79899 Other long term (current) drug therapy: Secondary | ICD-10-CM | POA: Diagnosis not present

## 2020-08-16 DIAGNOSIS — I252 Old myocardial infarction: Secondary | ICD-10-CM | POA: Diagnosis not present

## 2020-08-16 DIAGNOSIS — D6869 Other thrombophilia: Secondary | ICD-10-CM

## 2020-08-16 DIAGNOSIS — E119 Type 2 diabetes mellitus without complications: Secondary | ICD-10-CM | POA: Insufficient documentation

## 2020-08-16 DIAGNOSIS — I4891 Unspecified atrial fibrillation: Secondary | ICD-10-CM | POA: Insufficient documentation

## 2020-08-16 DIAGNOSIS — Z7901 Long term (current) use of anticoagulants: Secondary | ICD-10-CM | POA: Insufficient documentation

## 2020-08-16 DIAGNOSIS — Z95818 Presence of other cardiac implants and grafts: Secondary | ICD-10-CM | POA: Insufficient documentation

## 2020-08-16 MED ORDER — SPIRONOLACTONE 25 MG PO TABS: 12.5000 mg | ORAL_TABLET | Freq: Every day | ORAL | Status: DC

## 2020-08-16 NOTE — Progress Notes (Signed)
Patient ID: Erika Lee, female   DOB: 02-12-1946, 75 y.o.   MRN: [id number]      Primary Care Physician: [name], MD Referring Physician: Dr. Talbot Grumbling Erika Lee is a 75 y.o. female with a h/o afib s/p ablation, that is in afib clinic for f/u. She reports that she is doing well without any symptoms of afib. No bleeding issues with apixaban. Overall, she is feeling well. Heart rate at home upper 40's to mid 50's. Not symptomatic with low heart rate.Had left shoulder surgery in [place] and 2 days later was in the ER with dyspnea, elevated troponins.  She had a LHC and had normal arteries. It was  thought chest pain may have been  from coronary spasm. The pt is concerned that it may have been from  the nerve block at time of shoulder surgery. She  Has not had any further chest pain.  Continues  on eliquis with a CHA2DS2VASc score of at least 5.  Today, she denies symptoms of palpitations, chest pain, shortness of breath, orthopnea, PND, lower extremity edema, dizziness, presyncope, syncope, or neurologic sequela. The patient is tolerating medications without difficulties and is otherwise without complaint today.   Past Medical History:  Diagnosis Date  . Anticoagulant long-term use    eliquis  . Chronic sinusitis   . GERD (gastroesophageal reflux disease)   . Hemorrhoids   . History of colitis 10/2016   campylobacter  . Hyperlipidemia   . Hypertension   . Multinodular goiter    per pt has had 2 biopsy's both benign  . NSTEMI (non-ST elevated myocardial infarction) ([place])    12/2018- normal coronary arteries on cath, poss coronary vasospasm.   . Osteoarthritis    "all over"  and AC joint left shoulder  . Paroxysmal atrial fibrillation [facility]) EP cardiologist--  [name]   s/p PVI by [name] 07/16/2013;   pt first dx 2008,  recurrence 2014 afib/flutter and tachy-brady  . Rotator cuff tear, left   . Shoulder impingement, left   . Status post placement of implantable loop  recorder    original placement 07-26-2014;  removal / replacement 10-17-2017  by dr allred  . Type 2 diabetes mellitus ([place])    followed by pcp   Past Surgical History:  Procedure Laterality Date  . ATRIAL FIBRILLATION ABLATION N/A 07/16/2013   PVI and CTI ablation by [name]  . CARDIAC CATHETERIZATION     01/11/2019- Normal coronary arteries.   . COLONOSCOPY  09/10/2011   Dr. Arnoldo Morale: normal, 10 year follow up.  Marland Kitchen LEFT HEART CATH AND CORONARY ANGIOGRAPHY N/A 01/11/2019   Procedure: LEFT HEART CATH AND CORONARY ANGIOGRAPHY;  Surgeon: Lorretta Harp, MD;  Location: [place] CV LAB;  Service: Cardiovascular;  Laterality: N/A;  . LESION REMOVAL N/A 05/03/2013   Procedure: EXCISION CYST, BACK;  Surgeon: Jamesetta So, MD;  Location: [facility];  Service: General;  Laterality: N/A;  . LOOP RECORDER IMPLANT N/A 07/26/2014   Procedure: LOOP RECORDER IMPLANT;  Surgeon: Thompson Grayer, MD;  Location: [facility] CATH LAB;  Service: Cardiovascular;  Laterality: N/A;  . LOOP RECORDER INSERTION N/A 10/17/2017   MDT previously implanted ILR for afib management at [facility].  old device removed and new device placed by [name]  . LOOP RECORDER REMOVAL N/A 10/17/2017   MDT ILR removed with new device subsequently replaced  . SHOULDER ARTHROSCOPY WITH ROTATOR CUFF REPAIR AND SUBACROMIAL DECOMPRESSION Left 01/07/2019   Procedure: Left  shoulder subacromial decompression, distal clavicle resection, extensive debridement,;  Surgeon: Nicholes Stairs, MD;  Location: [facility];  Service: Orthopedics;  Laterality: Left;  . TEE WITHOUT CARDIOVERSION N/A 07/15/2013   Procedure: TRANSESOPHAGEAL ECHOCARDIOGRAM (TEE);  Surgeon: Lelon Perla, MD;  Location: [facility] ENDOSCOPY;  Service: Cardiovascular;  Laterality: N/A;  . TONSILLECTOMY  age 78  . TRANSANAL HEMORRHOIDAL DEARTERIALIZATION N/A 09/22/2015   Procedure: TRANSANAL HEMORRHOIDAL LIGATION/PEXY EUA POSSIBLE HEMORRHOIDECTOMY ;  Surgeon: Michael Boston, MD;   Location: [facility];  Service: General;  Laterality: N/A;    Current Outpatient Medications  Medication Sig Dispense Refill  . amLODipine (NORVASC) 5 MG tablet Take 0.5 tablets (2.5 mg total) by mouth daily. 90 tablet 3  . ELIQUIS 5 MG TABS tablet TAKE 1 TABLET BY MOUTH TWICE DAILY 60 tablet 3  . esomeprazole (NEXIUM) 20 MG capsule Take 20 mg by mouth daily.     . Glucose Blood (BLOOD GLUCOSE TEST STRIPS) STRP Use as directed to monitor FSBS 1x daily. Dx: E11.9. please dispense as Freestyle Precision Neo 50 strip 11  . hydrochlorothiazide (HYDRODIURIL) 25 MG tablet TAKE 1 TABLET BY MOUTH EVERY MORNING FOR FLUID RETENTION 90 tablet 3  . HYDROcodone-acetaminophen (NORCO) 10-325 MG tablet Take 1 tablet by mouth every 6 (six) hours as needed. 120 tablet 0  . Influenza vac split quadrivalent PF (FLUZONE HIGH-DOSE) 0.5 ML injection Fluzone High-Dose 2019-20 (PF) 180 mcg/0.5 mL intramuscular syringe  ADM 0.5ML IM UTD    . Lancets (FREESTYLE) lancets USE TO CHECK BLOOD SUGAR DAILY 100 each 2  . lisinopril (ZESTRIL) 40 MG tablet Take 1 tablet (40 mg total) by mouth daily. 90 tablet 3  . metFORMIN (GLUCOPHAGE) 500 MG tablet Take 1 tablet (500 mg total) by mouth daily. 90 tablet 3  . metoprolol succinate (TOPROL-XL) 25 MG 24 hr tablet TAKE ONE-HALF TABLET BY MOUTH DAILY 45 tablet 3  . nitroGLYCERIN (NITROSTAT) 0.4 MG SL tablet PLACE 1 TABLET UNDER THE TONGUE EVERY 5 MINUTES AS NEEDED FOR CHEST PAIN 25 tablet 3  . Potassium Gluconate 550 MG TABS Take 550 mg by mouth at bedtime.     . promethazine (PHENERGAN) 12.5 MG tablet Take 1 tablet (12.5 mg total) by mouth every 6 (six) hours as needed for nausea or vomiting. 30 tablet 3  . promethazine (PHENERGAN) 12.5 MG tablet Take 1 tablet (12.5 mg total) by mouth every 6 (six) hours as needed for nausea or vomiting. 30 tablet 3  . simvastatin (ZOCOR) 40 MG tablet TAKE 1 TABLET(40 MG) BY MOUTH AT BEDTIME 90 tablet 1  . triamcinolone cream (KENALOG) 0.1 % Apply 1  application topically 2 (two) times daily. 45 g 0  . clotrimazole (LOTRIMIN AF) 1 % cream Apply 1 application topically 2 (two) times daily. (Patient not taking: No sig reported) 30 g 0  . spironolactone (ALDACTONE) 25 MG tablet Take 0.5 tablets (12.5 mg total) by mouth daily.     No current facility-administered medications for this encounter.    Allergies  Allergen Reactions  . Clindamycin/Lincomycin Rash  . Doxycycline Other (See Comments)    Makes heart race  . Keflex [Cephalexin] Nausea And Vomiting  . Other   . Sulfonic Acid (3,5-Dibromo-4-H-Ox-Benz)   . Adhesive [Tape] Rash  . Latex Rash  . Sulfonamide Derivatives Nausea And Vomiting    Social History   Socioeconomic History  . Marital status: Married    Spouse name: Not on file  . Number of children: Not on file  .  Years of education: Not on file  . Highest education level: Not on file  Occupational History  . Occupation: Pensions consultant: RETIRED    Comment: Full time  Tobacco Use  . Smoking status: Former Smoker    Years: 34.00    Types: Cigarettes    Quit date: 01/02/1994    Years since quitting: 26.6  . Smokeless tobacco: Never Used  Vaping Use  . Vaping Use: Never used  Substance and Sexual Activity  . Alcohol use: No  . Drug use: No  . Sexual activity: Not on file  Other Topics Concern  . Not on file  Social History Narrative   Lives with spouse in [place]   Social Determinants of Health   Financial Resource Strain: Not on file  Food Insecurity: Not on file  Transportation Needs: Not on file  Physical Activity: Not on file  Stress: Not on file  Social Connections: Not on file  Intimate Partner Violence: Not on file    Family History  Problem Relation Age of Onset  . Stroke Mother 81  . Stroke Maternal Grandmother        stroke  . Other Maternal Grandfather        "heart dropsy" kidney issues  . Kidney disease Maternal Grandfather   . Colon cancer Neg Hx     ROS- All  systems are reviewed and negative except as per the HPI above  Physical Exam: Vitals:   08/16/20 1132  BP: 138/68  Pulse: (!) 48  Weight: 71.8 kg  Height: 5\' 5"  (1.651 m)    GEN- The patient is well appearing, alert and oriented x 3 today.   Head- normocephalic, atraumatic Eyes-  Sclera clear, conjunctiva pink Ears- hearing intact Oropharynx- clear Neck- supple, no JVP Lymph- no cervical lymphadenopathy Lungs- Clear to ausculation bilaterally, normal work of breathing Heart- Regular rate and rhythm, no murmurs, rubs or gallops, PMI not laterally displaced GI- soft, NT, ND, + BS Extremities- no clubbing, cyanosis, or edema MS- no significant deformity or atrophy Skin- no rash or lesion Psych- euthymic mood, full affect Neuro- strength and sensation are intact  EKG- Sinus brady at 48 bpm,  Pr int 196 ms, qtrs 94 ms, qtc 414 ms.  Assessment and Plan: 1. S/p afib ablation 07/15/14 Maitaining SR s/p abaltion Doing well  Continue low dose  metoprolol watching for symptomatic brady Continue apixaban 5 mg bid for a CHA2DS2VASc score of at least 5.  Recheck in 6 months with Dr. Lawrence Marseilles C. , [facility] [address] [place], [place] [address] [phone]

## 2020-08-17 NOTE — Progress Notes (Signed)
Carelink Summary Report / Loop Recorder 

## 2020-08-25 ENCOUNTER — Other Ambulatory Visit: Payer: Self-pay | Admitting: Family Medicine

## 2020-08-25 ENCOUNTER — Telehealth: Payer: Self-pay | Admitting: Pharmacist

## 2020-08-25 MED ORDER — HYDROCODONE-ACETAMINOPHEN 10-325 MG PO TABS: 1.0000 | ORAL_TABLET | Freq: Four times a day (QID) | ORAL | 0 refills | Status: DC | PRN

## 2020-08-25 NOTE — Progress Notes (Signed)
    Chronic Care Management Pharmacy Assistant   Name: Erika Lee  MRN: [id number] DOB: Dec 29, 1945  Reason for Encounter: Adherence Review  PCP : [name], MD  Verified Adherence Gap Information. Per insurance data, the patient is 90-99% compliant with HTN and CHOL medication, 60-69% compliant with DM. Per insurance data patient has not met their wellness bundle or their annual wellness visit screening. Their most recent A1C was 6.2 on 04/20/20 and their most recent blood pressure is 122/78 on 04/20/20.  Follow-Up:  Pharmacist Review   Charlann Lange, [place] Pharmacist Assistant [phone]

## 2020-08-25 NOTE — Telephone Encounter (Signed)
Hydrocodone --[id number] - [place], [place] [address]

## 2020-08-25 NOTE — Telephone Encounter (Signed)
Ok to refill??  Last office visit/  refill 07/24/2020.

## 2020-08-29 ENCOUNTER — Other Ambulatory Visit: Payer: Self-pay | Admitting: Internal Medicine

## 2020-09-07 ENCOUNTER — Telehealth: Payer: Self-pay | Admitting: Pharmacist
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
## 2020-09-14 LAB — CUP PACEART REMOTE DEVICE CHECK
Date Time Interrogation Session: 20220316003107
Implantable Pulse Generator Implant Date: 20190419

## 2020-09-18 ENCOUNTER — Ambulatory Visit (INDEPENDENT_AMBULATORY_CARE_PROVIDER_SITE_OTHER): Payer: PPO

## 2020-09-18 DIAGNOSIS — I495 Sick sinus syndrome: Secondary | ICD-10-CM

## 2020-09-25 NOTE — Progress Notes (Signed)
Carelink Summary Report / Loop Recorder 

## 2020-09-26 ENCOUNTER — Other Ambulatory Visit: Payer: Self-pay | Admitting: Family Medicine

## 2020-09-26 MED ORDER — HYDROCODONE-ACETAMINOPHEN 10-325 MG PO TABS: 1.0000 | ORAL_TABLET | Freq: Four times a day (QID) | ORAL | 0 refills | Status: DC | PRN

## 2020-09-26 NOTE — Telephone Encounter (Signed)
Pt came office needing refills of  HYDROcodone-acetaminophen (NORCO) 10-325 MG tablet   Cb#: [phone]

## 2020-10-16 ENCOUNTER — Ambulatory Visit (INDEPENDENT_AMBULATORY_CARE_PROVIDER_SITE_OTHER): Payer: PPO

## 2020-10-16 DIAGNOSIS — I495 Sick sinus syndrome: Secondary | ICD-10-CM | POA: Diagnosis not present

## 2020-10-18 LAB — CUP PACEART REMOTE DEVICE CHECK
Date Time Interrogation Session: 20220418010211
Implantable Pulse Generator Implant Date: 20190419

## 2020-10-23 ENCOUNTER — Other Ambulatory Visit: Payer: Self-pay | Admitting: Family Medicine

## 2020-10-23 ENCOUNTER — Ambulatory Visit (INDEPENDENT_AMBULATORY_CARE_PROVIDER_SITE_OTHER): Payer: PPO | Admitting: Family Medicine

## 2020-10-23 ENCOUNTER — Encounter: Payer: Self-pay | Admitting: Family Medicine

## 2020-10-23 ENCOUNTER — Other Ambulatory Visit: Payer: Self-pay

## 2020-10-23 VITALS — BP 128/86 | HR 48 | Temp 98.2°F | Resp 14 | Ht 65.0 in | Wt 157.0 lb

## 2020-10-23 DIAGNOSIS — E118 Type 2 diabetes mellitus with unspecified complications: Secondary | ICD-10-CM | POA: Diagnosis not present

## 2020-10-23 DIAGNOSIS — E785 Hyperlipidemia, unspecified: Secondary | ICD-10-CM | POA: Diagnosis not present

## 2020-10-23 DIAGNOSIS — I2510 Atherosclerotic heart disease of native coronary artery without angina pectoris: Secondary | ICD-10-CM

## 2020-10-23 DIAGNOSIS — I10 Essential (primary) hypertension: Secondary | ICD-10-CM | POA: Diagnosis not present

## 2020-10-23 DIAGNOSIS — I480 Paroxysmal atrial fibrillation: Secondary | ICD-10-CM | POA: Diagnosis not present

## 2020-10-23 MED ORDER — PREDNISONE 20 MG PO TABS: ORAL_TABLET | ORAL | 0 refills | Status: DC

## 2020-10-23 NOTE — Progress Notes (Signed)
Subjective:    Patient ID: Erika Lee, female    DOB: 02-Oct-1945, 75 y.o.   MRN: [id number]  Patient is a very pleasant 75 year old Caucasian female here today for a checkup.  She has a history of paroxysmal atrial fibrillation.  Today she is in normal sinus rhythm.  She has bradycardia with a heart rate of 48 bpm.  She is on 12.5 mg of metoprolol daily.  She denies any lightheadedness or syncope.  She is on a beta-blocker for rate control as well as her history of coronary artery disease status postmyocardial infarction.  She denies any shortness of breath.  She denies any chest pain.  She denies any dyspnea on exertion.  She denies any polyuria.  She denies any polydipsia.  She does report severe seasonal allergies despite taking Benadryl every 6 hours and Flonase.  She is also reporting coughing and occasionally wheezing due to the allergies Past Medical History:  Diagnosis Date  . Anticoagulant long-term use    eliquis  . Chronic sinusitis   . GERD (gastroesophageal reflux disease)   . Hemorrhoids   . History of colitis 10/2016   campylobacter  . Hyperlipidemia   . Hypertension   . Multinodular goiter    per pt has had 2 biopsy's both benign  . NSTEMI (non-ST elevated myocardial infarction) ([place])    12/2018- normal coronary arteries on cath, poss coronary vasospasm.   . Osteoarthritis    "all over"  and AC joint left shoulder  . Paroxysmal atrial fibrillation [facility]) EP cardiologist--  [name]   s/p PVI by [name] 07/16/2013;   pt first dx 2008,  recurrence 2014 afib/flutter and tachy-brady  . Rotator cuff tear, left   . Shoulder impingement, left   . Status post placement of implantable loop recorder    original placement 07-26-2014;  removal / replacement 10-17-2017  by dr allred  . Type 2 diabetes mellitus ([place])    followed by pcp   Past Surgical History:  Procedure Laterality Date  . ATRIAL FIBRILLATION ABLATION N/A 07/16/2013   PVI and CTI ablation by [name]  .  CARDIAC CATHETERIZATION     01/11/2019- Normal coronary arteries.   . COLONOSCOPY  09/10/2011   Dr. Arnoldo Morale: normal, 10 year follow up.  Marland Kitchen LEFT HEART CATH AND CORONARY ANGIOGRAPHY N/A 01/11/2019   Procedure: LEFT HEART CATH AND CORONARY ANGIOGRAPHY;  Surgeon: Lorretta Harp, MD;  Location: [place] CV LAB;  Service: Cardiovascular;  Laterality: N/A;  . LESION REMOVAL N/A 05/03/2013   Procedure: EXCISION CYST, BACK;  Surgeon: Jamesetta So, MD;  Location: [facility];  Service: General;  Laterality: N/A;  . LOOP RECORDER IMPLANT N/A 07/26/2014   Procedure: LOOP RECORDER IMPLANT;  Surgeon: Thompson Grayer, MD;  Location: [facility] CATH LAB;  Service: Cardiovascular;  Laterality: N/A;  . LOOP RECORDER INSERTION N/A 10/17/2017   MDT previously implanted ILR for afib management at [facility].  old device removed and new device placed by [name]  . LOOP RECORDER REMOVAL N/A 10/17/2017   MDT ILR removed with new device subsequently replaced  . SHOULDER ARTHROSCOPY WITH ROTATOR CUFF REPAIR AND SUBACROMIAL DECOMPRESSION Left 01/07/2019   Procedure: Left shoulder subacromial decompression, distal clavicle resection, extensive debridement,;  Surgeon: Nicholes Stairs, MD;  Location: [facility];  Service: Orthopedics;  Laterality: Left;  . TEE WITHOUT CARDIOVERSION N/A 07/15/2013   Procedure: TRANSESOPHAGEAL ECHOCARDIOGRAM (TEE);  Surgeon: Lelon Perla, MD;  Location: [place];  Service: Cardiovascular;  Laterality: N/A;  . TONSILLECTOMY  age 39  . TRANSANAL HEMORRHOIDAL DEARTERIALIZATION N/A 09/22/2015   Procedure: TRANSANAL HEMORRHOIDAL LIGATION/PEXY EUA POSSIBLE HEMORRHOIDECTOMY ;  Surgeon: Michael Boston, MD;  Location: [facility];  Service: General;  Laterality: N/A;   Current Outpatient Medications on File Prior to Visit  Medication Sig Dispense Refill  . amLODipine (NORVASC) 5 MG tablet Take 0.5 tablets (2.5 mg total) by mouth daily. 90 tablet 3  . clotrimazole (LOTRIMIN AF) 1 % cream Apply 1  application topically 2 (two) times daily. (Patient not taking: No sig reported) 30 g 0  . ELIQUIS 5 MG TABS tablet TAKE 1 TABLET BY MOUTH TWICE DAILY 60 tablet 3  . esomeprazole (NEXIUM) 20 MG capsule Take 20 mg by mouth daily.     . Glucose Blood (BLOOD GLUCOSE TEST STRIPS) STRP Use as directed to monitor FSBS 1x daily. Dx: E11.9. please dispense as Freestyle Precision Neo 50 strip 11  . hydrochlorothiazide (HYDRODIURIL) 25 MG tablet TAKE 1 TABLET BY MOUTH EVERY MORNING FOR FLUID RETENTION 90 tablet 3  . HYDROcodone-acetaminophen (NORCO) 10-325 MG tablet Take 1 tablet by mouth every 6 (six) hours as needed. 120 tablet 0  . Influenza vac split quadrivalent PF (FLUZONE HIGH-DOSE) 0.5 ML injection Fluzone High-Dose 2019-20 (PF) 180 mcg/0.5 mL intramuscular syringe  ADM 0.5ML IM UTD    . Lancets (FREESTYLE) lancets USE TO CHECK BLOOD SUGAR DAILY 100 each 2  . lisinopril (ZESTRIL) 40 MG tablet Take 1 tablet (40 mg total) by mouth daily. 90 tablet 3  . metFORMIN (GLUCOPHAGE) 500 MG tablet Take 1 tablet (500 mg total) by mouth daily. 90 tablet 3  . metoprolol succinate (TOPROL-XL) 25 MG 24 hr tablet TAKE ONE-HALF TABLET BY MOUTH DAILY 45 tablet 3  . nitroGLYCERIN (NITROSTAT) 0.4 MG SL tablet PLACE 1 TABLET UNDER THE TONGUE EVERY 5 MINUTES AS NEEDED FOR CHEST PAIN 25 tablet 3  . Potassium Gluconate 550 MG TABS Take 550 mg by mouth at bedtime.     . promethazine (PHENERGAN) 12.5 MG tablet Take 1 tablet (12.5 mg total) by mouth every 6 (six) hours as needed for nausea or vomiting. 30 tablet 3  . promethazine (PHENERGAN) 12.5 MG tablet Take 1 tablet (12.5 mg total) by mouth every 6 (six) hours as needed for nausea or vomiting. 30 tablet 3  . simvastatin (ZOCOR) 40 MG tablet TAKE 1 TABLET(40 MG) BY MOUTH AT BEDTIME 90 tablet 1  . spironolactone (ALDACTONE) 25 MG tablet Take 0.5 tablets (12.5 mg total) by mouth daily.    Marland Kitchen triamcinolone cream (KENALOG) 0.1 % Apply 1 application topically 2 (two) times daily.  45 g 0   No current facility-administered medications on file prior to visit.   Allergies  Allergen Reactions  . Clindamycin/Lincomycin Rash  . Doxycycline Other (See Comments)    Makes heart race  . Keflex [Cephalexin] Nausea And Vomiting  . Other   . Sulfonic Acid (3,5-Dibromo-4-H-Ox-Benz)   . Adhesive [Tape] Rash  . Latex Rash  . Sulfonamide Derivatives Nausea And Vomiting   Social History   Socioeconomic History  . Marital status: Married    Spouse name: Not on file  . Number of children: Not on file  . Years of education: Not on file  . Highest education level: Not on file  Occupational History  . Occupation: Pensions consultant: RETIRED    Comment: Full time  Tobacco Use  . Smoking status: Former Smoker    Years: 34.00  Types: Cigarettes    Quit date: 01/02/1994    Years since quitting: 26.8  . Smokeless tobacco: Never Used  Vaping Use  . Vaping Use: Never used  Substance and Sexual Activity  . Alcohol use: No  . Drug use: No  . Sexual activity: Not on file  Other Topics Concern  . Not on file  Social History Narrative   Lives with spouse in [place]   Social Determinants of Health   Financial Resource Strain: Not on file  Food Insecurity: Not on file  Transportation Needs: Not on file  Physical Activity: Not on file  Stress: Not on file  Social Connections: Not on file  Intimate Partner Violence: Not on file     Review of Systems  All other systems reviewed and are negative.      Objective:   Physical Exam Vitals reviewed.  Constitutional:      General: She is not in acute distress.    Appearance: Normal appearance. She is not ill-appearing or toxic-appearing.  Eyes:     Extraocular Movements: Extraocular movements intact.     Conjunctiva/sclera: Conjunctivae normal.     Pupils: Pupils are equal, round, and reactive to light.  Cardiovascular:     Rate and Rhythm: Normal rate and regular rhythm.     Pulses: Normal pulses.      Heart sounds: Normal heart sounds. No murmur heard. No friction rub. No gallop.   Pulmonary:     Effort: Pulmonary effort is normal. No respiratory distress.     Breath sounds: Normal breath sounds. No stridor. No wheezing, rhonchi or rales.  Abdominal:     General: Abdomen is flat. Bowel sounds are normal. There is no distension.     Palpations: Abdomen is soft.     Tenderness: There is no abdominal tenderness. There is no guarding.  Musculoskeletal:     Cervical back: Neck supple.     Lumbar back: Tenderness present. Decreased range of motion.     Right lower leg: No edema.     Left lower leg: No edema.  Skin:    General: Skin is warm.     Coloration: Skin is not jaundiced.     Findings: No bruising, erythema, lesion or rash.  Neurological:     General: No focal deficit present.     Mental Status: She is alert and oriented to person, place, and time.     Cranial Nerves: No cranial nerve deficit.     Sensory: No sensory deficit.     Motor: No weakness.     Coordination: Coordination normal.     Gait: Gait normal.     Deep Tendon Reflexes: Reflexes normal.  Psychiatric:        Mood and Affect: Mood normal.        Thought Content: Thought content normal.           Assessment & Plan:  Controlled type 2 diabetes mellitus with complication, without long-term current use of insulin ([place]) - Plan: CBC with Differential/Platelet, COMPLETE METABOLIC PANEL WITH GFR, Lipid panel, Hemoglobin A1c  CAD in native artery  Essential hypertension, benign  Hyperlipidemia, unspecified hyperlipidemia type  Paroxysmal atrial fibrillation (HCC)  We discussed her options and the patient would like a prednisone taper pack to control her allergies.  She will then use Flonase after she discontinues the prednisone taper pack because they have become so severe that she is having a difficult time breathing.  Blood pressure today is excellent however heart  rate is bradycardic.  I have asked the  patient to check her heart rate every day and to hold her beta-blocker if her heart rate is less than 50.  Check CBC, CMP, lipid panel, and an A1c.  Otherwise the patient seems to be doing well.

## 2020-10-24 LAB — CBC WITH DIFFERENTIAL/PLATELET
Absolute Monocytes: 640 cells/uL (ref 200–950)
Basophils Absolute: 58 cells/uL (ref 0–200)
Basophils Relative: 0.9 %
Eosinophils Absolute: 326 cells/uL (ref 15–500)
Eosinophils Relative: 5.1 %
HCT: 41.8 % (ref 35.0–45.0)
Hemoglobin: 13.8 g/dL (ref 11.7–15.5)
Lymphs Abs: 3008 cells/uL (ref 850–3900)
MCH: 29.1 pg (ref 27.0–33.0)
MCHC: 33 g/dL (ref 32.0–36.0)
MCV: 88.2 fL (ref 80.0–100.0)
MPV: 10.3 fL (ref 7.5–12.5)
Monocytes Relative: 10 %
Neutro Abs: 2368 cells/uL (ref 1500–7800)
Neutrophils Relative %: 37 %
Platelets: 207 10*3/uL (ref 140–400)
RBC: 4.74 10*6/uL (ref 3.80–5.10)
RDW: 12.4 % (ref 11.0–15.0)
Total Lymphocyte: 47 %
WBC: 6.4 10*3/uL (ref 3.8–10.8)

## 2020-10-24 LAB — COMPLETE METABOLIC PANEL WITH GFR
AG Ratio: 2 (calc) (ref 1.0–2.5)
ALT: 13 U/L (ref 6–29)
AST: 15 U/L (ref 10–35)
Albumin: 4.4 g/dL (ref 3.6–5.1)
Alkaline phosphatase (APISO): 80 U/L (ref 37–153)
BUN: 10 mg/dL (ref 7–25)
CO2: 28 mmol/L (ref 20–32)
Calcium: 9.6 mg/dL (ref 8.6–10.4)
Chloride: 98 mmol/L (ref 98–110)
Creat: 0.84 mg/dL (ref 0.60–0.93)
GFR, Est African American: 78 mL/min/{1.73_m2} (ref >=60)
GFR, Est Non African American: 68 mL/min/{1.73_m2} (ref >=60)
Globulin: 2.2 g/dL (calc) (ref 1.9–3.7)
Glucose, Bld: 105 mg/dL — ABNORMAL HIGH (ref 65–99)
Potassium: 3.9 mmol/L (ref 3.5–5.3)
Sodium: 135 mmol/L (ref 135–146)
Total Bilirubin: 0.5 mg/dL (ref 0.2–1.2)
Total Protein: 6.6 g/dL (ref 6.1–8.1)

## 2020-10-24 LAB — HEMOGLOBIN A1C
Hgb A1c MFr Bld: 6.4 % of total Hgb — ABNORMAL HIGH (ref <5.7)
Mean Plasma Glucose: 137 mg/dL
eAG (mmol/L): 7.6 mmol/L

## 2020-10-24 LAB — LIPID PANEL
Cholesterol: 123 mg/dL (ref <200)
HDL: 50 mg/dL (ref >=50)
LDL Cholesterol (Calc): 54 mg/dL (calc)
Non-HDL Cholesterol (Calc): 73 mg/dL (calc) (ref <130)
Total CHOL/HDL Ratio: 2.5 (calc) (ref <5.0)
Triglycerides: 103 mg/dL (ref <150)

## 2020-10-30 ENCOUNTER — Telehealth: Payer: Self-pay | Admitting: Family Medicine

## 2020-10-30 ENCOUNTER — Other Ambulatory Visit: Payer: Self-pay | Admitting: Family Medicine

## 2020-10-30 NOTE — Telephone Encounter (Signed)
Pt came in needing a refill of  metoprolol succinate (TOPROL-XL) 25 MG 24 hr tablet HYDROcodone-acetaminophen ([place]) 10-325    Cb#: [phone]

## 2020-10-30 NOTE — Telephone Encounter (Signed)
Pt came in wanting to know the results of her lab work. Please give a call back if they are ready to be discussed.  Cb#: [phone]

## 2020-10-31 ENCOUNTER — Telehealth: Payer: Self-pay | Admitting: Pharmacist

## 2020-10-31 MED ORDER — HYDROCODONE-ACETAMINOPHEN 10-325 MG PO TABS: 1.0000 | ORAL_TABLET | Freq: Four times a day (QID) | ORAL | 0 refills | Status: DC | PRN

## 2020-10-31 MED ORDER — METOPROLOL SUCCINATE ER 25 MG PO TB24: 0.5000 | ORAL_TABLET | Freq: Every day | ORAL | 3 refills | Status: DC

## 2020-10-31 NOTE — Telephone Encounter (Signed)
Call placed to patient and patient made aware.  

## 2020-10-31 NOTE — Telephone Encounter (Signed)
Erika Frizzle, MD  10/24/2020  7:05 AM EDT      Labs look outstanding.  Sugar test is perfect.  Cholesterol is great.  Liver and kidney test are normal.  Keep up the good work

## 2020-10-31 NOTE — Progress Notes (Signed)
Carelink Summary Report / Loop Recorder 

## 2020-10-31 NOTE — Progress Notes (Addendum)
    Chronic Care Management Pharmacy Assistant   Name: Erika Lee  MRN: [id number] DOB: 03-07-1946  Reason for Encounter: Medication Adherence Review   Conditions to be addressed/monitored:  Atrial Fibrillation, HTN, HLD and DMII  Recent office visits:  10/23/20 [name]. For Follow-Up. Labs drawn. Per note: After prednisone is complete patient was advised to start Flonase. STARTED Prednisone 20 mg pack.  Recent consult visits:  None since 09/07/20  Hospital visits:  None in previous 6 months  Medications: Outpatient Encounter Medications as of 10/31/2020  Medication Sig   amLODipine (NORVASC) 5 MG tablet Take 0.5 tablets (2.5 mg total) by mouth daily.   ELIQUIS 5 MG TABS tablet TAKE 1 TABLET BY MOUTH TWICE DAILY   esomeprazole (NEXIUM) 20 MG capsule Take 20 mg by mouth daily.    Glucose Blood (BLOOD GLUCOSE TEST STRIPS) STRP Use as directed to monitor FSBS 1x daily. Dx: E11.9. please dispense as Freestyle Precision Neo   hydrochlorothiazide (HYDRODIURIL) 25 MG tablet TAKE 1 TABLET BY MOUTH EVERY MORNING FOR FLUID RETENTION   HYDROcodone-acetaminophen (NORCO) 10-325 MG tablet Take 1 tablet by mouth every 6 (six) hours as needed.   Lancets (FREESTYLE) lancets USE TO CHECK BLOOD SUGAR DAILY   lisinopril (ZESTRIL) 40 MG tablet Take 1 tablet (40 mg total) by mouth daily.   metFORMIN (GLUCOPHAGE) 500 MG tablet Take 1 tablet (500 mg total) by mouth daily.   metoprolol succinate (TOPROL-XL) 25 MG 24 hr tablet Take 0.5 tablets (12.5 mg total) by mouth daily.   nitroGLYCERIN (NITROSTAT) 0.4 MG SL tablet PLACE 1 TABLET UNDER THE TONGUE EVERY 5 MINUTES AS NEEDED FOR CHEST PAIN   Potassium Gluconate 550 MG TABS Take 550 mg by mouth at bedtime.    predniSONE (DELTASONE) 20 MG tablet 3 tabs poqday 1-2, 2 tabs poqday 3-4, 1 tab poqday 5-6   promethazine (PHENERGAN) 12.5 MG tablet Take 1 tablet (12.5 mg total) by mouth every 6 (six) hours as needed for nausea or vomiting.   simvastatin (ZOCOR)  40 MG tablet TAKE 1 TABLET(40 MG) BY MOUTH AT BEDTIME   spironolactone (ALDACTONE) 25 MG tablet Take 0.5 tablets (12.5 mg total) by mouth daily. (Patient not taking: Reported on 10/23/2020)   No facility-administered encounter medications on file as of 10/31/2020.    Adherence Review: Simvastatin  Third unsuccessful telephone outreach was attempted today. The patient was referred to the pharmacist for assistance with care management and care coordination.    Star Rating Drugs: Lisinopril 40 mg 90 DS 10/21/20 metformin 500 mg 90 DS 08/08/20 simvastatin 40 mg 90 DS 10/24/20   Follow-Up:Pharmacist Review  Charlann Lange, RMA Clinical Pharmacist Assistant [phone]  3 minutes spent in review, coordination, and documentation.  Reviewed by: Beverly Milch, PharmD Clinical Pharmacist [facility] [phone]

## 2020-11-20 ENCOUNTER — Ambulatory Visit (INDEPENDENT_AMBULATORY_CARE_PROVIDER_SITE_OTHER): Payer: PPO

## 2020-11-20 DIAGNOSIS — I495 Sick sinus syndrome: Secondary | ICD-10-CM

## 2020-11-21 LAB — CUP PACEART REMOTE DEVICE CHECK
Date Time Interrogation Session: 20220521010111
Implantable Pulse Generator Implant Date: 20190419

## 2020-11-22 ENCOUNTER — Other Ambulatory Visit: Payer: Self-pay | Admitting: Internal Medicine

## 2020-11-28 ENCOUNTER — Telehealth: Payer: Self-pay | Admitting: Pharmacist

## 2020-11-28 NOTE — Progress Notes (Addendum)
    Chronic Care Management Pharmacy Assistant   Name: Erika Lee  MRN: [id number] DOB: [birth year]  Reason for Encounter: Adherence Review  Medications: Outpatient Encounter Medications as of 11/28/2020  Medication Sig   amLODipine (NORVASC) 5 MG tablet TAKE 1 TABLET(5 MG) BY MOUTH DAILY   ELIQUIS 5 MG TABS tablet TAKE 1 TABLET BY MOUTH TWICE DAILY   esomeprazole (NEXIUM) 20 MG capsule Take 20 mg by mouth daily.    Glucose Blood (BLOOD GLUCOSE TEST STRIPS) STRP Use as directed to monitor FSBS 1x daily. Dx: E11.9. please dispense as Freestyle Precision Neo   hydrochlorothiazide (HYDRODIURIL) 25 MG tablet TAKE 1 TABLET BY MOUTH EVERY MORNING FOR FLUID RETENTION   HYDROcodone-acetaminophen (NORCO) 10-325 MG tablet Take 1 tablet by mouth every 6 (six) hours as needed.   Lancets (FREESTYLE) lancets USE TO CHECK BLOOD SUGAR DAILY   lisinopril (ZESTRIL) 40 MG tablet Take 1 tablet (40 mg total) by mouth daily.   metFORMIN (GLUCOPHAGE) 500 MG tablet Take 1 tablet (500 mg total) by mouth daily.   metoprolol succinate (TOPROL-XL) 25 MG 24 hr tablet Take 0.5 tablets (12.5 mg total) by mouth daily.   nitroGLYCERIN (NITROSTAT) 0.4 MG SL tablet PLACE 1 TABLET UNDER THE TONGUE EVERY 5 MINUTES AS NEEDED FOR CHEST PAIN   Potassium Gluconate 550 MG TABS Take 550 mg by mouth at bedtime.    predniSONE (DELTASONE) 20 MG tablet 3 tabs poqday 1-2, 2 tabs poqday 3-4, 1 tab poqday 5-6   promethazine (PHENERGAN) 12.5 MG tablet Take 1 tablet (12.5 mg total) by mouth every 6 (six) hours as needed for nausea or vomiting.   simvastatin (ZOCOR) 40 MG tablet TAKE 1 TABLET(40 MG) BY MOUTH AT BEDTIME   spironolactone (ALDACTONE) 25 MG tablet Take 0.5 tablets (12.5 mg total) by mouth daily. (Patient not taking: Reported on 10/23/2020)   No facility-administered encounter medications on file as of 11/28/2020.    Reviewed the patients chart for any medical/health and/or medication changes there were not any at this  time.  Follow-Up:Pharmacist Review   Charlann Lange, [place] Pharmacist Assistant [phone]

## 2020-11-30 ENCOUNTER — Telehealth: Payer: Self-pay | Admitting: Pharmacist

## 2020-11-30 NOTE — Progress Notes (Addendum)
    Chronic Care Management Pharmacy Assistant   Name: Erika Lee  MRN: [id number] DOB: 27-May-1946  Reason for Encounter: General Disease State Call/ Medication Adherence    Conditions to be addressed/monitored: Atrial Fibrillation, HTN, HLD and DMII  Recent office visits:  None since 11/28/20  Recent consult visits:  None since 11/28/20  Hospital visits:  None since 11/28/20  Medications: Outpatient Encounter Medications as of 11/30/2020  Medication Sig   amLODipine (NORVASC) 5 MG tablet TAKE 1 TABLET(5 MG) BY MOUTH DAILY   ELIQUIS 5 MG TABS tablet TAKE 1 TABLET BY MOUTH TWICE DAILY   esomeprazole (NEXIUM) 20 MG capsule Take 20 mg by mouth daily.    Glucose Blood (BLOOD GLUCOSE TEST STRIPS) STRP Use as directed to monitor FSBS 1x daily. Dx: E11.9. please dispense as Freestyle Precision Neo   hydrochlorothiazide (HYDRODIURIL) 25 MG tablet TAKE 1 TABLET BY MOUTH EVERY MORNING FOR FLUID RETENTION   HYDROcodone-acetaminophen (NORCO) 10-325 MG tablet Take 1 tablet by mouth every 6 (six) hours as needed.   Lancets (FREESTYLE) lancets USE TO CHECK BLOOD SUGAR DAILY   lisinopril (ZESTRIL) 40 MG tablet Take 1 tablet (40 mg total) by mouth daily.   metFORMIN (GLUCOPHAGE) 500 MG tablet Take 1 tablet (500 mg total) by mouth daily.   metoprolol succinate (TOPROL-XL) 25 MG 24 hr tablet Take 0.5 tablets (12.5 mg total) by mouth daily.   nitroGLYCERIN (NITROSTAT) 0.4 MG SL tablet PLACE 1 TABLET UNDER THE TONGUE EVERY 5 MINUTES AS NEEDED FOR CHEST PAIN   Potassium Gluconate 550 MG TABS Take 550 mg by mouth at bedtime.    predniSONE (DELTASONE) 20 MG tablet 3 tabs poqday 1-2, 2 tabs poqday 3-4, 1 tab poqday 5-6   promethazine (PHENERGAN) 12.5 MG tablet Take 1 tablet (12.5 mg total) by mouth every 6 (six) hours as needed for nausea or vomiting.   simvastatin (ZOCOR) 40 MG tablet TAKE 1 TABLET(40 MG) BY MOUTH AT BEDTIME   spironolactone (ALDACTONE) 25 MG tablet Take 0.5 tablets (12.5 mg total) by  mouth daily. (Patient not taking: Reported on 10/23/2020)   No facility-administered encounter medications on file as of 11/30/2020.   GEN CALL: Patient stated she needs a refill on Eliquis she stated around June- July she goes into the donut hole. We agreed on starting a PAP for her. Patient stated her knees and back pain still hurts but she is going to go see someone soon to see if there is anything the doctor can do to help her.  We went over her medications and she state those are all of her active medications and she stated she did not have any questions or concerns about her medications at this time.   Star Rating Drugs:Lisinopril 40 mg 90 DS 10/21/20 metformin 500 mg 90 DS 08/08/20 simvastatin 40 mg 90 DS 10/24/20  Follow-Up:Pharmacist Review  Charlann Lange, RMA Clinical Pharmacist Assistant [phone]  10 minutes spent in review, coordination, and documentation.  Reviewed by: Beverly Milch, PharmD Clinical Pharmacist [facility] [phone]

## 2020-12-05 ENCOUNTER — Telehealth: Payer: Self-pay

## 2020-12-05 NOTE — Progress Notes (Addendum)
    Chronic Care Management Pharmacy Assistant   Name: ZARRIA TOWELL  MRN: [id number] DOB: 09-27-1945  Reason for Encounter: PAP  Medications: Outpatient Encounter Medications as of 12/05/2020  Medication Sig   amLODipine (NORVASC) 5 MG tablet TAKE 1 TABLET(5 MG) BY MOUTH DAILY   ELIQUIS 5 MG TABS tablet TAKE 1 TABLET BY MOUTH TWICE DAILY   esomeprazole (NEXIUM) 20 MG capsule Take 20 mg by mouth daily.    Glucose Blood (BLOOD GLUCOSE TEST STRIPS) STRP Use as directed to monitor FSBS 1x daily. Dx: E11.9. please dispense as Freestyle Precision Neo   hydrochlorothiazide (HYDRODIURIL) 25 MG tablet TAKE 1 TABLET BY MOUTH EVERY MORNING FOR FLUID RETENTION   HYDROcodone-acetaminophen (NORCO) 10-325 MG tablet Take 1 tablet by mouth every 6 (six) hours as needed.   Lancets (FREESTYLE) lancets USE TO CHECK BLOOD SUGAR DAILY   lisinopril (ZESTRIL) 40 MG tablet Take 1 tablet (40 mg total) by mouth daily.   metFORMIN (GLUCOPHAGE) 500 MG tablet Take 1 tablet (500 mg total) by mouth daily.   metoprolol succinate (TOPROL-XL) 25 MG 24 hr tablet Take 0.5 tablets (12.5 mg total) by mouth daily.   nitroGLYCERIN (NITROSTAT) 0.4 MG SL tablet PLACE 1 TABLET UNDER THE TONGUE EVERY 5 MINUTES AS NEEDED FOR CHEST PAIN   Potassium Gluconate 550 MG TABS Take 550 mg by mouth at bedtime.    predniSONE (DELTASONE) 20 MG tablet 3 tabs poqday 1-2, 2 tabs poqday 3-4, 1 tab poqday 5-6   promethazine (PHENERGAN) 12.5 MG tablet Take 1 tablet (12.5 mg total) by mouth every 6 (six) hours as needed for nausea or vomiting.   simvastatin (ZOCOR) 40 MG tablet TAKE 1 TABLET(40 MG) BY MOUTH AT BEDTIME   spironolactone (ALDACTONE) 25 MG tablet Take 0.5 tablets (12.5 mg total) by mouth daily. (Patient not taking: Reported on 10/23/2020)   No facility-administered encounter medications on file as of 12/05/2020.    New patient assistance application form filled out to [employer] for [employer]. Waiting for patient and provider to  complete and sign documentation. Called patient to inquire if they wanted the application mailed to them or if they wanted to come into the office. Patient is required to sign application and to bring/have proof of income. She stated she would be willing to come into office to bring proof of income and sign application once the paperwork comes in the mail she would like it mailed to their residence address [address] [address] [place] [address].  Follow-Up:Pharmacist Review   Charlann Lange, RMA Clinical Pharmacist Assistant [phone]  5 minutes spent in review, coordination, and documentation.  Reviewed by: Beverly Milch, PharmD Clinical Pharmacist [facility] [phone]

## 2020-12-08 NOTE — Progress Notes (Signed)
Carelink Summary Report / Loop Recorder 

## 2020-12-12 ENCOUNTER — Other Ambulatory Visit: Payer: Self-pay | Admitting: Family Medicine

## 2020-12-12 MED ORDER — PROMETHAZINE HCL 12.5 MG PO TABS: 12.5000 mg | ORAL_TABLET | Freq: Four times a day (QID) | ORAL | 3 refills | Status: DC | PRN

## 2020-12-12 MED ORDER — HYDROCODONE-ACETAMINOPHEN 10-325 MG PO TABS: 1.0000 | ORAL_TABLET | Freq: Four times a day (QID) | ORAL | 0 refills | Status: DC | PRN

## 2020-12-18 ENCOUNTER — Telehealth: Payer: Self-pay

## 2020-12-18 ENCOUNTER — Ambulatory Visit (HOSPITAL_COMMUNITY)
Admission: RE | Admit: 2020-12-18 | Discharge: 2020-12-18 | Disposition: A | Discharge status: Home / Self Care | Payer: PPO | Source: Ambulatory Visit | Attending: Family Medicine | Admitting: Family Medicine

## 2020-12-18 ENCOUNTER — Other Ambulatory Visit: Payer: Self-pay

## 2020-12-18 ENCOUNTER — Telehealth: Payer: Self-pay | Admitting: Family Medicine

## 2020-12-18 DIAGNOSIS — R0781 Pleurodynia: Secondary | ICD-10-CM | POA: Diagnosis not present

## 2020-12-18 DIAGNOSIS — S2241XA Multiple fractures of ribs, right side, initial encounter for closed fracture: Secondary | ICD-10-CM | POA: Diagnosis not present

## 2020-12-18 NOTE — Telephone Encounter (Signed)
Received call from patient in AM that was triaged by another nurse.   Patient states that she tripped over box and fell on R side. Reported that she has pain in upper chest and under axilla when she tries to take a deep breath. Patient was advised to go to ER for evaluation.   Patient called back reporting that order for X-ray had not been placed. Advised that patient was told to go to ER for evaluation, not x-ray. The ER MD will order x-ray if they feel it is pertinent.   Patient states that she is not going to ER to wait all day for an x-ray. States that she has F/U appt with PCP on Thursday.   Order placed for x-ray. Advised if SOB, chest pain noted, or pain in ribs worsens, go to ER for evaluation. Patient agreeable to ER evaluation if Sx worsen.

## 2020-12-18 NOTE — Telephone Encounter (Signed)
Patient called from hospital; she's waiting for the xray order to be called in. Patient stated order hasn't been received yet. Please advise at [phone].

## 2020-12-19 ENCOUNTER — Telehealth: Payer: Self-pay

## 2020-12-19 NOTE — Telephone Encounter (Signed)
error 

## 2020-12-19 NOTE — Telephone Encounter (Signed)
Noted  

## 2020-12-19 NOTE — Telephone Encounter (Signed)
Spoke with pt regarding xray report. Pt denies dyspnea, coughing up blood or fever.  Taking pain pills for pain and will purchase the "ri belt" suggested.

## 2020-12-21 ENCOUNTER — Ambulatory Visit: Payer: Self-pay | Admitting: Family Medicine

## 2020-12-21 LAB — CUP PACEART REMOTE DEVICE CHECK
Date Time Interrogation Session: 20220623010911
Implantable Pulse Generator Implant Date: 20190419

## 2020-12-22 ENCOUNTER — Encounter: Payer: Self-pay | Admitting: Family Medicine

## 2020-12-22 ENCOUNTER — Ambulatory Visit (INDEPENDENT_AMBULATORY_CARE_PROVIDER_SITE_OTHER): Payer: PPO | Admitting: Family Medicine

## 2020-12-22 ENCOUNTER — Other Ambulatory Visit: Payer: Self-pay

## 2020-12-22 VITALS — BP 128/64 | HR 62 | Temp 98.1°F | Resp 14 | Ht 65.0 in | Wt 149.0 lb

## 2020-12-22 DIAGNOSIS — R0781 Pleurodynia: Secondary | ICD-10-CM

## 2020-12-22 DIAGNOSIS — M170 Bilateral primary osteoarthritis of knee: Secondary | ICD-10-CM | POA: Diagnosis not present

## 2020-12-22 DIAGNOSIS — M25561 Pain in right knee: Secondary | ICD-10-CM | POA: Diagnosis not present

## 2020-12-22 NOTE — Progress Notes (Signed)
Subjective:    Patient ID: Erika Lee, female    DOB: [birth year], 75 y.o.   MRN: [id number]  HPI Friday 1 week ago, the patient fell when she tripped in the dark in her home.  She landed on her right wrist and suffered a contusion to her right anterior chest wall.  She developed pain in the right anterior chest wall.  We performed an x-ray that showed a mildly displaced right anterior fourth rib fracture.  She denies any cough.  She denies any fever.  She denies any chills.  She denies any hemoptysis.  She does have some mild pleurisy.  Its uncomfortable at night when she tries to sleep but overall she is doing well. Past Medical History:  Diagnosis Date   Anticoagulant long-term use    eliquis   Chronic sinusitis    GERD (gastroesophageal reflux disease)    Hemorrhoids    History of colitis 10/2016   campylobacter   Hyperlipidemia    Hypertension    Multinodular goiter    per pt has had 2 biopsy's both benign   NSTEMI (non-ST elevated myocardial infarction) ([place])    12/2018- normal coronary arteries on cath, poss coronary vasospasm.    Osteoarthritis    "all over"  and [facility] joint left shoulder   Paroxysmal atrial fibrillation [facility]) EP cardiologist--  [name]   s/p PVI by [name] 07/16/2013;   pt first dx 2008,  recurrence 2014 afib/flutter and tachy-brady   Rotator cuff tear, left    Shoulder impingement, left    Status post placement of implantable loop recorder    original placement 07-26-2014;  removal / replacement 10-17-2017  by dr allred   Type 2 diabetes mellitus ([place])    followed by pcp   Past Surgical History:  Procedure Laterality Date   ATRIAL FIBRILLATION ABLATION N/A 07/16/2013   PVI and CTI ablation by [name]   CARDIAC CATHETERIZATION     01/11/2019- Normal coronary arteries.    COLONOSCOPY  09/10/2011   Dr. Arnoldo Morale: normal, 10 year follow up.   LEFT HEART CATH AND CORONARY ANGIOGRAPHY N/A 01/11/2019   Procedure: LEFT HEART CATH AND CORONARY ANGIOGRAPHY;   Surgeon: Lorretta Harp, MD;  Location: [place] CV LAB;  Service: Cardiovascular;  Laterality: N/A;   LESION REMOVAL N/A 05/03/2013   Procedure: EXCISION CYST, BACK;  Surgeon: Jamesetta So, MD;  Location: [facility];  Service: General;  Laterality: N/A;   LOOP RECORDER IMPLANT N/A 07/26/2014   Procedure: LOOP RECORDER IMPLANT;  Surgeon: Thompson Grayer, MD;  Location: [facility] CATH LAB;  Service: Cardiovascular;  Laterality: N/A;   LOOP RECORDER INSERTION N/A 10/17/2017   MDT previously implanted ILR for afib management at [facility].  old device removed and new device placed by [name]   LOOP RECORDER REMOVAL N/A 10/17/2017   MDT ILR removed with new device subsequently replaced   SHOULDER ARTHROSCOPY WITH ROTATOR CUFF REPAIR AND SUBACROMIAL DECOMPRESSION Left 01/07/2019   Procedure: Left shoulder subacromial decompression, distal clavicle resection, extensive debridement,;  Surgeon: Nicholes Stairs, MD;  Location: [facility];  Service: Orthopedics;  Laterality: Left;   TEE WITHOUT CARDIOVERSION N/A 07/15/2013   Procedure: TRANSESOPHAGEAL ECHOCARDIOGRAM (TEE);  Surgeon: Lelon Perla, MD;  Location: [facility] ENDOSCOPY;  Service: Cardiovascular;  Laterality: N/A;   TONSILLECTOMY  age 67   TRANSANAL HEMORRHOIDAL DEARTERIALIZATION N/A 09/22/2015   Procedure: TRANSANAL HEMORRHOIDAL LIGATION/PEXY EUA POSSIBLE HEMORRHOIDECTOMY ;  Surgeon: Michael Boston, MD;  Location: [facility];  Service: General;  Laterality: N/A;   Current Outpatient Medications on File Prior to Visit  Medication Sig Dispense Refill   amLODipine (NORVASC) 5 MG tablet TAKE 1 TABLET(5 MG) BY MOUTH DAILY 90 tablet 3   ELIQUIS 5 MG TABS tablet TAKE 1 TABLET BY MOUTH TWICE DAILY 60 tablet 3   esomeprazole (NEXIUM) 20 MG capsule Take 20 mg by mouth daily.      Glucose Blood (BLOOD GLUCOSE TEST STRIPS) STRP Use as directed to monitor FSBS 1x daily. Dx: E11.9. please dispense as Freestyle Precision Neo 50 strip 11   hydrochlorothiazide  (HYDRODIURIL) 25 MG tablet TAKE 1 TABLET BY MOUTH EVERY MORNING FOR FLUID RETENTION 90 tablet 3   HYDROcodone-acetaminophen (NORCO) 10-325 MG tablet Take 1 tablet by mouth every 6 (six) hours as needed. 120 tablet 0   Lancets (FREESTYLE) lancets USE TO CHECK BLOOD SUGAR DAILY 100 each 2   lisinopril (ZESTRIL) 40 MG tablet Take 1 tablet (40 mg total) by mouth daily. 90 tablet 3   metFORMIN (GLUCOPHAGE) 500 MG tablet Take 1 tablet (500 mg total) by mouth daily. 90 tablet 3   metoprolol succinate (TOPROL-XL) 25 MG 24 hr tablet Take 0.5 tablets (12.5 mg total) by mouth daily. 45 tablet 3   nitroGLYCERIN (NITROSTAT) 0.4 MG SL tablet PLACE 1 TABLET UNDER THE TONGUE EVERY 5 MINUTES AS NEEDED FOR CHEST PAIN 25 tablet 3   Potassium Gluconate 550 MG TABS Take 550 mg by mouth at bedtime.      predniSONE (DELTASONE) 20 MG tablet 3 tabs poqday 1-2, 2 tabs poqday 3-4, 1 tab poqday 5-6 12 tablet 0   promethazine (PHENERGAN) 12.5 MG tablet Take 1 tablet (12.5 mg total) by mouth every 6 (six) hours as needed for nausea or vomiting. 30 tablet 3   simvastatin (ZOCOR) 40 MG tablet TAKE 1 TABLET(40 MG) BY MOUTH AT BEDTIME 90 tablet 1   spironolactone (ALDACTONE) 25 MG tablet Take 0.5 tablets (12.5 mg total) by mouth daily. (Patient not taking: Reported on 10/23/2020)     No current facility-administered medications on file prior to visit.   Marland Kitchenall Social History   Socioeconomic History   Marital status: Married    Spouse name: Not on file   Number of children: Not on file   Years of education: Not on file   Highest education level: Not on file  Occupational History   Occupation: Pensions consultant: RETIRED    Comment: Full time  Tobacco Use   Smoking status: Former    Years: 34.00    Pack years: 0.00    Types: Cigarettes    Quit date: 01/02/1994    Years since quitting: 26.9   Smokeless tobacco: Never  Vaping Use   Vaping Use: Never used  Substance and Sexual Activity   Alcohol use: No    Drug use: No   Sexual activity: Not on file  Other Topics Concern   Not on file  Social History Narrative   Lives with spouse in [place] Determinants of Health   Financial Resource Strain: Not on file  Food Insecurity: Not on file  Transportation Needs: Not on file  Physical Activity: Not on file  Stress: Not on file  Social Connections: Not on file  Intimate Partner Violence: Not on file      Review of Systems  All other systems reviewed and are negative.     Objective:   Physical Exam Vitals reviewed.  Constitutional:  Appearance: Normal appearance.  Cardiovascular:     Rate and Rhythm: Normal rate. Rhythm irregular.     Heart sounds: Normal heart sounds. No murmur heard.   No friction rub. No gallop.  Pulmonary:     Effort: Pulmonary effort is normal. No respiratory distress.     Breath sounds: Normal breath sounds. No stridor. No wheezing or rales.  Chest:     Chest wall: Tenderness present.  Musculoskeletal:     Right lower leg: No edema.     Left lower leg: No edema.  Neurological:     Mental Status: She is alert.          Assessment & Plan:   Rib pain on right side Patient has an anterior right fourth rib fracture.  Symptomatic care only.  Recommended no heavy lifting.  There is no evidence of pneumothorax or pneumonia today on exam.  She is moving good air and she is not short of breath.  There is no respiratory distress.  Therefore tincture of time.  Anticipate gradual improvement over the next 4 to 6 weeks.

## 2020-12-23 DIAGNOSIS — M1712 Unilateral primary osteoarthritis, left knee: Secondary | ICD-10-CM | POA: Insufficient documentation

## 2020-12-23 DIAGNOSIS — M1711 Unilateral primary osteoarthritis, right knee: Secondary | ICD-10-CM | POA: Insufficient documentation

## 2020-12-25 ENCOUNTER — Ambulatory Visit (INDEPENDENT_AMBULATORY_CARE_PROVIDER_SITE_OTHER): Payer: PPO

## 2020-12-25 DIAGNOSIS — I495 Sick sinus syndrome: Secondary | ICD-10-CM

## 2020-12-28 ENCOUNTER — Other Ambulatory Visit: Payer: Self-pay

## 2020-12-28 ENCOUNTER — Ambulatory Visit (INDEPENDENT_AMBULATORY_CARE_PROVIDER_SITE_OTHER): Payer: PPO

## 2020-12-28 DIAGNOSIS — Z0000 Encounter for general adult medical examination without abnormal findings: Secondary | ICD-10-CM | POA: Diagnosis not present

## 2020-12-28 DIAGNOSIS — Z780 Asymptomatic menopausal state: Secondary | ICD-10-CM

## 2020-12-28 NOTE — Progress Notes (Signed)
Subjective:   Erika Lee is a 75 y.o. female who presents for an Initial Medicare Annual Wellness Visit.  I connected with Raye Sorrow today by telephone and verified that I am speaking with the correct person using two identifiers. Location patient: home Location provider: work Persons participating in the virtual visit: patient, provider.   I discussed the limitations, risks, security and privacy concerns of performing an evaluation and management service by telephone and the availability of in person appointments. I also discussed with the patient that there may be a patient responsible charge related to this service. The patient expressed understanding and verbally consented to this telephonic visit.    Interactive audio and video telecommunications were attempted between this provider and patient, however failed, due to patient having technical difficulties OR patient did not have access to video capability.  We continued and completed visit with audio only.     Review of Systems    N/A  Cardiac Risk Factors include: advanced age (>52men, >77 women);diabetes mellitus;hypertension;dyslipidemia     Objective:    Today's Vitals   There is no height or weight on file to calculate BMI.  Advanced Directives 12/28/2020 01/09/2019 08/25/2018 10/17/2017 11/21/2016 11/20/2016 02/21/2016  Does Patient Have a Medical Advance Directive? No No No No No No No  Would patient like information on creating a medical advance directive? No - Patient declined No - Patient declined No - Patient declined Yes (Inpatient - patient defers creating a medical advance directive at this time) No - Patient declined - -  Pre-existing out of facility DNR order (yellow form or pink MOST form) - - - - - - -    Current Medications (verified) Outpatient Encounter Medications as of 12/28/2020  Medication Sig   amLODipine (NORVASC) 5 MG tablet TAKE 1 TABLET(5 MG) BY MOUTH DAILY   ELIQUIS 5 MG TABS tablet TAKE 1  TABLET BY MOUTH TWICE DAILY   esomeprazole (NEXIUM) 20 MG capsule Take 20 mg by mouth daily.    Glucose Blood (BLOOD GLUCOSE TEST STRIPS) STRP Use as directed to monitor FSBS 1x daily. Dx: E11.9. please dispense as Freestyle Precision Neo   hydrochlorothiazide (HYDRODIURIL) 25 MG tablet TAKE 1 TABLET BY MOUTH EVERY MORNING FOR FLUID RETENTION   HYDROcodone-acetaminophen (NORCO) 10-325 MG tablet Take 1 tablet by mouth every 6 (six) hours as needed.   Lancets (FREESTYLE) lancets USE TO CHECK BLOOD SUGAR DAILY   lisinopril (ZESTRIL) 40 MG tablet Take 1 tablet (40 mg total) by mouth daily.   metFORMIN (GLUCOPHAGE) 500 MG tablet Take 1 tablet (500 mg total) by mouth daily.   metoprolol succinate (TOPROL-XL) 25 MG 24 hr tablet Take 0.5 tablets (12.5 mg total) by mouth daily.   Potassium Gluconate 550 MG TABS Take 550 mg by mouth at bedtime.    promethazine (PHENERGAN) 12.5 MG tablet Take 1 tablet (12.5 mg total) by mouth every 6 (six) hours as needed for nausea or vomiting.   simvastatin (ZOCOR) 40 MG tablet TAKE 1 TABLET(40 MG) BY MOUTH AT BEDTIME   [DISCONTINUED] predniSONE (DELTASONE) 20 MG tablet 3 tabs poqday 1-2, 2 tabs poqday 3-4, 1 tab poqday 5-6   nitroGLYCERIN (NITROSTAT) 0.4 MG SL tablet PLACE 1 TABLET UNDER THE TONGUE EVERY 5 MINUTES AS NEEDED FOR CHEST PAIN (Patient not taking: Reported on 12/28/2020)   spironolactone (ALDACTONE) 25 MG tablet Take 0.5 tablets (12.5 mg total) by mouth daily.   No facility-administered encounter medications on file as of 12/28/2020.    Allergies (  verified) Clindamycin/lincomycin; Doxycycline; Keflex [cephalexin]; Other; Sulfonic acid (3,5-dibromo-4-h-ox-benz); Adhesive [tape]; Latex; and Sulfonamide derivatives   History: Past Medical History:  Diagnosis Date   Anticoagulant long-term use    eliquis   Chronic sinusitis    GERD (gastroesophageal reflux disease)    Hemorrhoids    History of colitis 10/2016   campylobacter   Hyperlipidemia     Hypertension    Multinodular goiter    per pt has had 2 biopsy's both benign   NSTEMI (non-ST elevated myocardial infarction) ([place])    12/2018- normal coronary arteries on cath, poss coronary vasospasm.    Osteoarthritis    "all over"  and [facility] joint left shoulder   Paroxysmal atrial fibrillation [facility]) EP cardiologist--  [name]   s/p PVI by [name] 07/16/2013;   pt first dx 2008,  recurrence 2014 afib/flutter and tachy-brady   Rotator cuff tear, left    Shoulder impingement, left    Status post placement of implantable loop recorder    original placement 07-26-2014;  removal / replacement 10-17-2017  by dr allred   Type 2 diabetes mellitus ([place])    followed by pcp   Past Surgical History:  Procedure Laterality Date   ATRIAL FIBRILLATION ABLATION N/A 07/16/2013   PVI and CTI ablation by [name]   CARDIAC CATHETERIZATION     01/11/2019- Normal coronary arteries.    COLONOSCOPY  09/10/2011   Dr. Arnoldo Morale: normal, 10 year follow up.   LEFT HEART CATH AND CORONARY ANGIOGRAPHY N/A 01/11/2019   Procedure: LEFT HEART CATH AND CORONARY ANGIOGRAPHY;  Surgeon: Lorretta Harp, MD;  Location: [place] CV LAB;  Service: Cardiovascular;  Laterality: N/A;   LESION REMOVAL N/A 05/03/2013   Procedure: EXCISION CYST, BACK;  Surgeon: Jamesetta So, MD;  Location: [facility];  Service: General;  Laterality: N/A;   LOOP RECORDER IMPLANT N/A 07/26/2014   Procedure: LOOP RECORDER IMPLANT;  Surgeon: Thompson Grayer, MD;  Location: [facility] CATH LAB;  Service: Cardiovascular;  Laterality: N/A;   LOOP RECORDER INSERTION N/A 10/17/2017   MDT previously implanted ILR for afib management at [facility].  old device removed and new device placed by [name]   LOOP RECORDER REMOVAL N/A 10/17/2017   MDT ILR removed with new device subsequently replaced   SHOULDER ARTHROSCOPY WITH ROTATOR CUFF REPAIR AND SUBACROMIAL DECOMPRESSION Left 01/07/2019   Procedure: Left shoulder subacromial decompression, distal clavicle resection, extensive  debridement,;  Surgeon: Nicholes Stairs, MD;  Location[facility];  Service: Orthopedics;  Laterality: Left;   TEE WITHOUT CARDIOVERSION N/A 07/15/2013   Procedure: TRANSESOPHAGEAL ECHOCARDIOGRAM (TEE);  Surgeon: Lelon Perla, MD;  Location[facility] ENDOSCOPY;  Service: Cardiovascular;  Laterality: N/A;   TONSILLECTOMY  age 33   TRANSANAL HEMORRHOIDAL DEARTERIALIZATION N/A 09/22/2015   Procedure: TRANSANAL HEMORRHOIDAL LIGATION/PEXY EUA POSSIBLE HEMORRHOIDECTOMY ;  Surgeon: Michael Boston, MD;  Location: [facility] ORS;  Service: General;  Laterality: N/A;   Family History  Problem Relation Age of Onset   Stroke Mother 31   Stroke Maternal Grandmother        stroke   Other Maternal Grandfather        "heart dropsy" kidney issues   Kidney disease Maternal Grandfather    Colon cancer Neg Hx    Social History   Socioeconomic History   Marital status: Married    Spouse name: Not on file   Number of children: Not on file   Years of education: Not on file   Highest education  level: Not on file  Occupational History   Occupation: Pensions consultant: RETIRED    Comment: Full time  Tobacco Use   Smoking status: Former    Years: 34.00    Pack years: 0.00    Types: Cigarettes    Quit date: 01/02/1994    Years since quitting: 27.0   Smokeless tobacco: Never  Vaping Use   Vaping Use: Never used  Substance and Sexual Activity   Alcohol use: No   Drug use: No   Sexual activity: Not on file  Other Topics Concern   Not on file  Social History Narrative   Lives with spouse in [place] Determinants of Health   Financial Resource Strain: Low Risk    Difficulty of Paying Living Expenses: Not hard at all  Food Insecurity: No Food Insecurity   Worried About Charity fundraiser in the Last Year: Never true   [place] in the Last Year: Never true  Transportation Needs: No Transportation Needs   Lack of Transportation (Medical): No   Lack of  Transportation (Non-Medical): No  Physical Activity: Inactive   Days of Exercise per Week: 0 days   Minutes of Exercise per Session: 0 min  Stress: No Stress Concern Present   Feeling of Stress : Not at all  Social Connections: Moderately Integrated   Frequency of Communication with Friends and Family: Three times a week   Frequency of Social Gatherings with Friends and Family: Once a week   Attends Religious Services: More than 4 times per year   Active Member of [employer] or Organizations: No   Attends Music therapist: Never   Marital Status: Married    Tobacco Counseling Counseling given: Not Answered   Clinical Intake:  Pre-visit preparation completed: Yes  Pain : No/denies pain     Nutritional Risks: None Diabetes: Yes CBG done?: No Did pt. bring in CBG monitor from home?: No  How often do you need to have someone help you when you read instructions, pamphlets, or other written materials from your doctor or pharmacy?: 1 - Never  Diabetic?Yes Nutrition Risk Assessment:  Has the patient had any N/V/D within the last 2 months?  No  Does the patient have any non-healing wounds?  No  Has the patient had any unintentional weight loss or weight gain?  No   Diabetes:  Is the patient diabetic?  Yes  If diabetic, was a CBG obtained today?  No  Did the patient bring in their glucometer from home?  No  How often do you monitor your CBG's? Patient states checks glucose daily.   Financial Strains and Diabetes Management:  Are you having any financial strains with the device, your supplies or your medication? No .  Does the patient want to be seen by Chronic Care Management for management of their diabetes?  No  Would the patient like to be referred to a Nutritionist or for Diabetic Management?  No   Diabetic Exams:  Diabetic Eye Exam: Overdue for diabetic eye exam. Pt has been advised about the importance in completing this exam. Patient advised to call and  schedule an eye exam. Diabetic Foot Exam: Completed 04/20/2020   Interpreter Needed?: No  Information entered by :: Craig of Daily Living In your present state of health, do you have any difficulty performing the following activities: 12/28/2020  Hearing? N  Vision? N  Difficulty concentrating or making decisions? N  Walking or climbing stairs? N  Dressing or bathing? N  Doing errands, shopping? N  Preparing Food and eating ? N  Using the Toilet? N  In the past six months, have you accidently leaked urine? N  Do you have problems with loss of bowel control? N  Managing your Medications? N  Managing your Finances? N  Housekeeping or managing your Housekeeping? N  Some recent data might be hidden    Patient Care Team: [name], MD as PCP - General (Family Medicine) Thompson Grayer, MD as PCP - Cardiology (Cardiology) Michael Boston, MD as Consulting Physician (General Surgery) Thompson Grayer, MD as Consulting Physician (Cardiology) Aviva Signs, MD as Consulting Physician (Gastroenterology) Gala Romney Cristopher Estimable, MD as Consulting Physician (Gastroenterology) Edythe Clarity, [facility] as Pharmacist (Pharmacist)  Indicate any recent Medical Services you may have received from other than [facility] providers in the past year (date may be approximate).     Assessment:   This is a routine wellness examination for Erika Lee.  Hearing/Vision screen Vision Screening - Comments:: Patient stated that she gets her eye examined once per year. Currently wears reading glasses   Dietary issues and exercise activities discussed: Current Exercise Habits: The patient does not participate in regular exercise at present, Exercise limited by: orthopedic condition(s)   Goals Addressed   None    Depression Screen PHQ 2/9 Scores 12/28/2020 07/24/2020 10/19/2019  PHQ - 2 Score 0 0 0    Fall Risk Fall Risk  12/28/2020 07/24/2020 10/19/2019 02/15/2016  Falls in the past year? 1 0 0 No   Number falls in past yr: 0 0 - -  Injury with Fall? 1 0 0 -  Risk for fall due to : No Fall Risks - - -  Follow up Falls evaluation completed;Falls prevention discussed;Education provided Falls evaluation completed Falls evaluation completed -    FALL RISK PREVENTION PERTAINING TO THE HOME:  Any stairs in or around the home? No  If so, are there any without handrails? No  Home free of loose throw rugs in walkways, pet beds, electrical cords, etc? Yes  Adequate lighting in your home to reduce risk of falls? Yes   ASSISTIVE DEVICES UTILIZED TO PREVENT FALLS:  Life alert? No  Use of a cane, walker or w/c? No  Grab bars in the bathroom? No  Shower chair or bench in shower? No  Elevated toilet seat or a handicapped toilet? No     Cognitive Function:  Normal cognitive status assessed by direct observation by this Nurse Health Advisor. No abnormalities found.        Immunizations Immunization History  Administered Date(s) Administered   Fluad Quad(high Dose 65+) 04/20/2020   Influenza Split 04/16/2017, 04/23/2018   Influenza, High Dose Seasonal PF 04/23/2018, 03/02/2019   PFIZER(Purple Top)SARS-COV-2 Vaccination 08/04/2019, 08/28/2019   Pneumococcal Conjugate-13 06/02/2018   Pneumococcal Polysaccharide-23 07/16/2016    TDAP status: Due, Education has been provided regarding the importance of this vaccine. Advised may receive this vaccine at local pharmacy or Health Dept. Aware to provide a copy of the vaccination record if obtained from local pharmacy or Health Dept. Verbalized acceptance and understanding.  Flu Vaccine status: Up to date  Pneumococcal vaccine status: Up to date  Covid-19 vaccine status: Completed vaccines  Qualifies for Shingles Vaccine? Yes   Zostavax completed No   Shingrix Completed?: No.    Education has been provided regarding the importance of this vaccine. Patient has been advised to call insurance company to determine out  of pocket expense if they  have not yet received this vaccine. Advised may also receive vaccine at local pharmacy or Health Dept. Verbalized acceptance and understanding.  Screening Tests Health Maintenance  Topic Date Due   OPHTHALMOLOGY EXAM  Never done   Hepatitis C Screening  Never done   TETANUS/TDAP  Never done   Zoster Vaccines- Shingrix (1 of 2) Never done   COVID-19 Vaccine (3 - Pfizer risk series) 09/25/2019   INFLUENZA VACCINE  01/29/2021   FOOT EXAM  04/20/2021   HEMOGLOBIN A1C  04/24/2021   DEXA SCAN  Completed   PNA vac Low Risk Adult  Completed   HPV VACCINES  Aged Out    Health Maintenance  Health Maintenance Due  Topic Date Due   OPHTHALMOLOGY EXAM  Never done   Hepatitis C Screening  Never done   TETANUS/TDAP  Never done   Zoster Vaccines- Shingrix (1 of 2) Never done   COVID-19 Vaccine (3 - Pfizer risk series) 09/25/2019    Colorectal cancer screening: Type of screening: Colonoscopy. Completed 09/10/2011. Repeat every 10 years  Mammogram status: Ordered 12/28/2020. Pt provided with contact info and advised to call to schedule appt.   Bone Density status: Ordered 12/28/2020. Pt provided with contact info and advised to call to schedule appt.  Lung Cancer Screening: (Low Dose CT Chest recommended if Age 75-80 years, 30 pack-year currently smoking OR have quit w/in 15years.) does not qualify.   Lung Cancer Screening Referral: N/A   Additional Screening:  Hepatitis C Screening: does qualify;   Vision Screening: Recommended annual ophthalmology exams for early detection of glaucoma and other disorders of the eye. Is the patient up to date with their annual eye exam?  Yes  Who is the provider or what is the name of the office in which the patient attends annual eye exams? [facility]  If pt is not established with a provider, would they like to be referred to a provider to establish care? No .   Dental Screening: Recommended annual dental exams for proper oral  hygiene  Community Resource Referral / Chronic Care Management: CRR required this visit?  No   CCM required this visit?  No      Plan:     I have personally reviewed and noted the following in the patient's chart:   Medical and social history Use of alcohol, tobacco or illicit drugs  Current medications and supplements including opioid prescriptions. Patient is not currently taking opioid prescriptions. Functional ability and status Nutritional status Physical activity Advanced directives List of other physicians Hospitalizations, surgeries, and ER visits in previous 12 months Vitals Screenings to include cognitive, depression, and falls Referrals and appointments  In addition, I have reviewed and discussed with patient certain preventive protocols, quality metrics, and best practice recommendations. A written personalized care plan for preventive services as well as general preventive health recommendations were provided to patient.     Ofilia Neas, LPN   [id number]   Nurse Notes: None

## 2020-12-28 NOTE — Patient Instructions (Signed)
[name] , Thank you for taking time to come for your Medicare Wellness Visit. I appreciate your ongoing commitment to your health goals. Please review the following plan we discussed and let me know if I can assist you in the future.   Screening recommendations/referrals: Colonoscopy: Up to date, next due 09/09/2021 Mammogram: Currently due, you have declined this on today. Breast cancer screenings are recommended until age 77 Bone Density: Currently due, orders placed this visit  Recommended yearly ophthalmology/optometry visit for glaucoma screening and checkup Recommended yearly dental visit for hygiene and checkup  Vaccinations: Influenza vaccine: Up to date, next due fall 2022  Pneumococcal vaccine: Completed series  Tdap vaccine: Currently due, you may await and injury to receive  Shingles vaccine: Currently due for Shingrix, if you would like to receive we recommend that you do so at your local pharmacy    Advanced directives: Advance directive discussed with you today. Even though you declined this today please call our office should you change your mind and we can give you the proper paperwork for you to fill out.   Conditions/risks identified: None   Next appointment: None    Preventive Care 65 Years and Older, Female Preventive care refers to lifestyle choices and visits with your health care provider that can promote health and wellness. What does preventive care include? A yearly physical exam. This is also called an annual well check. Dental exams once or twice a year. Routine eye exams. Ask your health care provider how often you should have your eyes checked. Personal lifestyle choices, including: Daily care of your teeth and gums. Regular physical activity. Eating a healthy diet. Avoiding tobacco and drug use. Limiting alcohol use. Practicing safe sex. Taking low-dose aspirin every day. Taking vitamin and mineral supplements as recommended by your health  care provider. What happens during an annual well check? The services and screenings done by your health care provider during your annual well check will depend on your age, overall health, lifestyle risk factors, and family history of disease. Counseling  Your health care provider may ask you questions about your: Alcohol use. Tobacco use. Drug use. Emotional well-being. Home and relationship well-being. Sexual activity. Eating habits. History of falls. Memory and ability to understand (cognition). Work and work Statistician. Reproductive health. Screening  You may have the following tests or measurements: Height, weight, and BMI. Blood pressure. Lipid and cholesterol levels. These may be checked every 5 years, or more frequently if you are over 60 years old. Skin check. Lung cancer screening. You may have this screening every year starting at age 19 if you have a 30-pack-year history of smoking and currently smoke or have quit within the past 15 years. Fecal occult blood test (FOBT) of the stool. You may have this test every year starting at age 55. Flexible sigmoidoscopy or colonoscopy. You may have a sigmoidoscopy every 5 years or a colonoscopy every 10 years starting at age 56. Hepatitis C blood test. Hepatitis B blood test. Sexually transmitted disease (STD) testing. Diabetes screening. This is done by checking your blood sugar (glucose) after you have not eaten for a while (fasting). You may have this done every 1-3 years. Bone density scan. This is done to screen for osteoporosis. You may have this done starting at age 47. Mammogram. This may be done every 1-2 years. Talk to your health care provider about how often you should have regular mammograms. Talk with your health care provider about your test results, treatment options,  and if necessary, the need for more tests. Vaccines  Your health care provider may recommend certain vaccines, such as: Influenza vaccine. This is  recommended every year. Tetanus, diphtheria, and acellular pertussis (Tdap, Td) vaccine. You may need a Td booster every 10 years. Zoster vaccine. You may need this after age 50. Pneumococcal 13-valent conjugate (PCV13) vaccine. One dose is recommended after age 95. Pneumococcal polysaccharide (PPSV23) vaccine. One dose is recommended after age 72. Talk to your health care provider about which screenings and vaccines you need and how often you need them. This information is not intended to replace advice given to you by your health care provider. Make sure you discuss any questions you have with your health care provider. Document Released: 07/14/2015 Document Revised: 03/06/2016 Document Reviewed: 04/18/2015 Elsevier Interactive Patient Education  2017 [employer] Prevention in the Home Falls can cause injuries. They can happen to people of all ages. There are many things you can do to make your home safe and to help prevent falls. What can I do on the outside of my home? Regularly fix the edges of walkways and driveways and fix any cracks. Remove anything that might make you trip as you walk through a door, such as a raised step or threshold. Trim any bushes or trees on the path to your home. Use bright outdoor lighting. Clear any walking paths of anything that might make someone trip, such as rocks or tools. Regularly check to see if handrails are loose or broken. Make sure that both sides of any steps have handrails. Any raised decks and porches should have guardrails on the edges. Have any leaves, snow, or ice cleared regularly. Use sand or salt on walking paths during winter. Clean up any spills in your garage right away. This includes oil or grease spills. What can I do in the bathroom? Use night lights. Install grab bars by the toilet and in the tub and shower. Do not use towel bars as grab bars. Use non-skid mats or decals in the tub or shower. If you need to sit down in  the shower, use a plastic, non-slip stool. Keep the floor dry. Clean up any water that spills on the floor as soon as it happens. Remove soap buildup in the tub or shower regularly. Attach bath mats securely with double-sided non-slip rug tape. Do not have throw rugs and other things on the floor that can make you trip. What can I do in the bedroom? Use night lights. Make sure that you have a light by your bed that is easy to reach. Do not use any sheets or blankets that are too big for your bed. They should not hang down onto the floor. Have a firm chair that has side arms. You can use this for support while you get dressed. Do not have throw rugs and other things on the floor that can make you trip. What can I do in the kitchen? Clean up any spills right away. Avoid walking on wet floors. Keep items that you use a lot in easy-to-reach places. If you need to reach something above you, use a strong step stool that has a grab bar. Keep electrical cords out of the way. Do not use floor polish or wax that makes floors slippery. If you must use wax, use non-skid floor wax. Do not have throw rugs and other things on the floor that can make you trip. What can I do with my stairs? Do not leave  any items on the stairs. Make sure that there are handrails on both sides of the stairs and use them. Fix handrails that are broken or loose. Make sure that handrails are as long as the stairways. Check any carpeting to make sure that it is firmly attached to the stairs. Fix any carpet that is loose or worn. Avoid having throw rugs at the top or bottom of the stairs. If you do have throw rugs, attach them to the floor with carpet tape. Make sure that you have a light switch at the top of the stairs and the bottom of the stairs. If you do not have them, ask someone to add them for you. What else can I do to help prevent falls? Wear shoes that: Do not have high heels. Have rubber bottoms. Are comfortable  and fit you well. Are closed at the toe. Do not wear sandals. If you use a stepladder: Make sure that it is fully opened. Do not climb a closed stepladder. Make sure that both sides of the stepladder are locked into place. Ask someone to hold it for you, if possible. Clearly mark and make sure that you can see: Any grab bars or handrails. First and last steps. Where the edge of each step is. Use tools that help you move around (mobility aids) if they are needed. These include: Canes. Walkers. Scooters. Crutches. Turn on the lights when you go into a dark area. Replace any light bulbs as soon as they burn out. Set up your furniture so you have a clear path. Avoid moving your furniture around. If any of your floors are uneven, fix them. If there are any pets around you, be aware of where they are. Review your medicines with your doctor. Some medicines can make you feel dizzy. This can increase your chance of falling. Ask your doctor what other things that you can do to help prevent falls. This information is not intended to replace advice given to you by your health care provider. Make sure you discuss any questions you have with your health care provider. Document Released: 04/13/2009 Document Revised: 11/23/2015 Document Reviewed: 07/22/2014 Elsevier Interactive Patient Education  2017 [employer].

## 2021-01-09 ENCOUNTER — Other Ambulatory Visit: Payer: Self-pay | Admitting: Family Medicine

## 2021-01-09 MED ORDER — HYDROCODONE-ACETAMINOPHEN 10-325 MG PO TABS: 1.0000 | ORAL_TABLET | Freq: Four times a day (QID) | ORAL | 0 refills | Status: DC | PRN

## 2021-01-09 NOTE — Telephone Encounter (Signed)
Patient called to follow up on refill request for  HYDROcodone-acetaminophen (NORCO) 10-325 MG tablet [[id number]]   Pharmacy confirmed as  [facility], [address] [place], [place] [address]  Phone:  [phone]  Fax:  [phone]  DEA #:  [id number]  Please advise at [phone]

## 2021-01-09 NOTE — Telephone Encounter (Signed)
Request sent to pcp.

## 2021-01-10 NOTE — Progress Notes (Signed)
Carelink Summary Report / Loop Recorder 

## 2021-01-16 ENCOUNTER — Other Ambulatory Visit: Payer: Self-pay | Admitting: Family Medicine

## 2021-01-22 ENCOUNTER — Other Ambulatory Visit: Payer: Self-pay

## 2021-01-22 ENCOUNTER — Encounter: Payer: Self-pay | Admitting: Family Medicine

## 2021-01-22 ENCOUNTER — Ambulatory Visit (INDEPENDENT_AMBULATORY_CARE_PROVIDER_SITE_OTHER): Payer: PPO | Admitting: Family Medicine

## 2021-01-22 VITALS — BP 122/66 | HR 56 | Temp 98.1°F | Resp 14 | Ht 65.0 in | Wt 149.0 lb

## 2021-01-22 DIAGNOSIS — I10 Essential (primary) hypertension: Secondary | ICD-10-CM | POA: Diagnosis not present

## 2021-01-22 DIAGNOSIS — E118 Type 2 diabetes mellitus with unspecified complications: Secondary | ICD-10-CM

## 2021-01-22 DIAGNOSIS — E785 Hyperlipidemia, unspecified: Secondary | ICD-10-CM | POA: Diagnosis not present

## 2021-01-22 DIAGNOSIS — I480 Paroxysmal atrial fibrillation: Secondary | ICD-10-CM

## 2021-01-22 DIAGNOSIS — I2510 Atherosclerotic heart disease of native coronary artery without angina pectoris: Secondary | ICD-10-CM

## 2021-01-22 MED ORDER — DICLOFENAC SODIUM 1 % EX GEL: 2.0000 g | Freq: Four times a day (QID) | CUTANEOUS | 2 refills | Status: DC

## 2021-01-22 NOTE — Progress Notes (Signed)
Subjective:    Patient ID: Erika Lee, female    DOB: 1945-10-08, 75 y.o.   MRN: [id number]  Patient is a very pleasant 75 year old female here today for a checkup.  She has a history of paroxysmal atrial fibrillation.  Today she is in normal sinus rhythm however her heart rate is well controlled.  She denies any bradycardia or lightheadedness or syncope or near syncope.  Her blood pressure is well controlled today as well.  She does have a history of a non-STEMI x2.  She denies any chest pain, shortness of breath, or dyspnea on exertion.  She also has a history of type 2 diabetes.  Her last A1c in April was 6.4.  She is due to recheck that again today.  She denies any polyuria polydipsia or blurry vision.  She continues to complain of chronic low back pain for which she takes hydrocodone.  However she denies any weakness in her legs or numbness in her legs.  She denies any saddle anesthesias or bowel or bladder incontinence. Past Medical History:  Diagnosis Date   Anticoagulant long-term use    eliquis   Chronic sinusitis    GERD (gastroesophageal reflux disease)    Hemorrhoids    History of colitis 10/2016   campylobacter   Hyperlipidemia    Hypertension    Multinodular goiter    per pt has had 2 biopsy's both benign   NSTEMI (non-ST elevated myocardial infarction) ([place])    12/2018- normal coronary arteries on cath, poss coronary vasospasm.    Osteoarthritis    "all over"  and [facility] joint left shoulder   Paroxysmal atrial fibrillation [facility]) EP cardiologist--  [name]   s/p PVI by [name] 07/16/2013;   pt first dx 2008,  recurrence 2014 afib/flutter and tachy-brady   Rotator cuff tear, left    Shoulder impingement, left    Status post placement of implantable loop recorder    original placement 07-26-2014;  removal / replacement 10-17-2017  by dr allred   Type 2 diabetes mellitus ([place])    followed by pcp   Past Surgical History:  Procedure Laterality Date   ATRIAL FIBRILLATION  ABLATION N/A 07/16/2013   PVI and CTI ablation by [name]   CARDIAC CATHETERIZATION     01/11/2019- Normal coronary arteries.    COLONOSCOPY  09/10/2011   Dr. Arnoldo Morale: normal, 10 year follow up.   LEFT HEART CATH AND CORONARY ANGIOGRAPHY N/A 01/11/2019   Procedure: LEFT HEART CATH AND CORONARY ANGIOGRAPHY;  Surgeon: Lorretta Harp, MD;  Location: [facility];  Service: Cardiovascular;  Laterality: N/A;   LESION REMOVAL N/A 05/03/2013   Procedure: EXCISION CYST, BACK;  Surgeon: Jamesetta So, MD;  Location: [facility];  Service: General;  Laterality: N/A;   LOOP RECORDER IMPLANT N/A 07/26/2014   Procedure: LOOP RECORDER IMPLANT;  Surgeon: Thompson Grayer, MD;  Location: [facility] CATH LAB;  Service: Cardiovascular;  Laterality: N/A;   LOOP RECORDER INSERTION N/A 10/17/2017   MDT previously implanted ILR for afib management at [facility].  old device removed and new device placed by [name]   LOOP RECORDER REMOVAL N/A 10/17/2017   MDT ILR removed with new device subsequently replaced   SHOULDER ARTHROSCOPY WITH ROTATOR CUFF REPAIR AND SUBACROMIAL DECOMPRESSION Left 01/07/2019   Procedure: Left shoulder subacromial decompression, distal clavicle resection, extensive debridement,;  Surgeon: Nicholes Stairs, MD;  Location[facility];  Service: Orthopedics;  Laterality: Left;   TEE WITHOUT CARDIOVERSION  N/A 07/15/2013   Procedure: TRANSESOPHAGEAL ECHOCARDIOGRAM (TEE);  Surgeon: Lelon Perla, MD;  Location: [facility] ENDOSCOPY;  Service: Cardiovascular;  Laterality: N/A;   TONSILLECTOMY  age 19   TRANSANAL HEMORRHOIDAL DEARTERIALIZATION N/A 09/22/2015   Procedure: TRANSANAL HEMORRHOIDAL LIGATION/PEXY EUA POSSIBLE HEMORRHOIDECTOMY ;  Surgeon: Michael Boston, MD;  Location: [facility];  Service: General;  Laterality: N/A;   Current Outpatient Medications on File Prior to Visit  Medication Sig Dispense Refill   amLODipine (NORVASC) 5 MG tablet TAKE 1 TABLET(5 MG) BY MOUTH DAILY 90 tablet 3   ELIQUIS 5  MG TABS tablet TAKE 1 TABLET BY MOUTH TWICE DAILY 60 tablet 3   esomeprazole (NEXIUM) 20 MG capsule Take 20 mg by mouth daily.      Glucose Blood (BLOOD GLUCOSE TEST STRIPS) STRP Use as directed to monitor FSBS 1x daily. Dx: E11.9. please dispense as Freestyle Precision Neo 50 strip 11   hydrochlorothiazide (HYDRODIURIL) 25 MG tablet TAKE 1 TABLET BY MOUTH EVERY MORNING FOR FLUID RETENTION 90 tablet 3   HYDROcodone-acetaminophen (NORCO) 10-325 MG tablet Take 1 tablet by mouth every 6 (six) hours as needed. 120 tablet 0   Lancets (FREESTYLE) lancets USE TO CHECK BLOOD SUGAR DAILY 100 each 2   lisinopril (ZESTRIL) 40 MG tablet Take 1 tablet (40 mg total) by mouth daily. 90 tablet 3   metFORMIN (GLUCOPHAGE) 500 MG tablet Take 1 tablet (500 mg total) by mouth daily. 90 tablet 3   metoprolol succinate (TOPROL-XL) 25 MG 24 hr tablet Take 0.5 tablets (12.5 mg total) by mouth daily. 45 tablet 3   nitroGLYCERIN (NITROSTAT) 0.4 MG SL tablet PLACE 1 TABLET UNDER THE TONGUE EVERY 5 MINUTES AS NEEDED FOR CHEST PAIN 25 tablet 3   Potassium Gluconate 550 MG TABS Take 550 mg by mouth at bedtime.      promethazine (PHENERGAN) 12.5 MG tablet Take 1 tablet (12.5 mg total) by mouth every 6 (six) hours as needed for nausea or vomiting. 30 tablet 3   simvastatin (ZOCOR) 40 MG tablet TAKE 1 TABLET(40 MG) BY MOUTH AT BEDTIME 90 tablet 1   spironolactone (ALDACTONE) 25 MG tablet Take 0.5 tablets (12.5 mg total) by mouth daily.     No current facility-administered medications on file prior to visit.   Allergies  Allergen Reactions   Clindamycin/Lincomycin Rash   Doxycycline Other (See Comments)    Makes heart race   Keflex [Cephalexin] Nausea And Vomiting   Other    Sulfonic Acid (3,5-Dibromo-4-H-Ox-Benz)    Adhesive [Tape] Rash   Latex Rash   Sulfonamide Derivatives Nausea And Vomiting   Social History   Socioeconomic History   Marital status: Married    Spouse name: Not on file   Number of children: Not on  file   Years of education: Not on file   Highest education level: Not on file  Occupational History   Occupation: Pensions consultant: RETIRED    Comment: Full time  Tobacco Use   Smoking status: Former    Years: 34.00    Types: Cigarettes    Quit date: 01/02/1994    Years since quitting: 27.0   Smokeless tobacco: Never  Vaping Use   Vaping Use: Never used  Substance and Sexual Activity   Alcohol use: No   Drug use: No   Sexual activity: Not on file  Other Topics Concern   Not on file  Social History Narrative   Lives with spouse in [place] Determinants of  Health   Financial Resource Strain: Low Risk    Difficulty of Paying Living Expenses: Not hard at all  Food Insecurity: No Food Insecurity   Worried About [place] in the Last Year: Never true   Ran Out of Food in the Last Year: Never true  Transportation Needs: No Transportation Needs   Lack of Transportation (Medical): No   Lack of Transportation (Non-Medical): No  Physical Activity: Inactive   Days of Exercise per Week: 0 days   Minutes of Exercise per Session: 0 min  Stress: No Stress Concern Present   Feeling of Stress : Not at all  Social Connections: Moderately Integrated   Frequency of Communication with Friends and Family: Three times a week   Frequency of Social Gatherings with Friends and Family: Once a week   Attends Religious Services: More than 4 times per year   Active Member of [employer] or Organizations: No   Attends Archivist Meetings: Never   Marital Status: Married  Human resources officer Violence: Not At Risk   Fear of Current or Ex-Partner: No   Emotionally Abused: No   Physically Abused: No   Sexually Abused: No     Review of Systems  All other systems reviewed and are negative.     Objective:   Physical Exam Vitals reviewed.  Constitutional:      General: She is not in acute distress.    Appearance: Normal appearance. She is not ill-appearing or  toxic-appearing.  Eyes:     Extraocular Movements: Extraocular movements intact.     Conjunctiva/sclera: Conjunctivae normal.     Pupils: Pupils are equal, round, and reactive to light.  Cardiovascular:     Rate and Rhythm: Normal rate and regular rhythm.     Pulses: Normal pulses.     Heart sounds: Normal heart sounds. No murmur heard.   No friction rub. No gallop.  Pulmonary:     Effort: Pulmonary effort is normal. No respiratory distress.     Breath sounds: Normal breath sounds. No stridor. No wheezing, rhonchi or rales.  Abdominal:     General: Abdomen is flat. Bowel sounds are normal. There is no distension.     Palpations: Abdomen is soft.     Tenderness: There is no abdominal tenderness. There is no guarding.  Musculoskeletal:     Cervical back: Neck supple.     Lumbar back: Tenderness present. Decreased range of motion.     Right lower leg: No edema.     Left lower leg: No edema.  Skin:    General: Skin is warm.     Coloration: Skin is not jaundiced.     Findings: No bruising, erythema, lesion or rash.  Neurological:     General: No focal deficit present.     Mental Status: She is alert and oriented to person, place, and time.     Cranial Nerves: No cranial nerve deficit.     Sensory: No sensory deficit.     Motor: No weakness.     Coordination: Coordination normal.     Gait: Gait normal.     Deep Tendon Reflexes: Reflexes normal.  Psychiatric:        Mood and Affect: Mood normal.        Thought Content: Thought content normal.          Assessment & Plan:  Controlled type 2 diabetes mellitus with complication, without long-term current use of insulin ([place]) - Plan: CBC with Differential/Platelet, COMPLETE  METABOLIC PANEL WITH GFR, Hemoglobin A1c  CAD in native artery  Essential hypertension, benign  Paroxysmal atrial fibrillation (HCC)  Hyperlipidemia, unspecified hyperlipidemia type Regarding her diabetes, I will check an A1c.  If her A1c is elevated,  will consider adding Jardiance due to cardiovascular benefits.  Blood pressure today is well controlled.  Check a fasting lipid panel.  Goal LDL cholesterol is less than 70 given her history of coronary artery disease.  Blood pressure today is well controlled.

## 2021-01-23 ENCOUNTER — Encounter: Payer: Self-pay | Admitting: *Deleted

## 2021-01-23 LAB — HEMOGLOBIN A1C
Hgb A1c MFr Bld: 6.4 % of total Hgb — ABNORMAL HIGH (ref <5.7)
Mean Plasma Glucose: 137 mg/dL
eAG (mmol/L): 7.6 mmol/L

## 2021-01-23 LAB — COMPLETE METABOLIC PANEL WITH GFR
AG Ratio: 2 (calc) (ref 1.0–2.5)
ALT: 11 U/L (ref 6–29)
AST: 16 U/L (ref 10–35)
Albumin: 4.4 g/dL (ref 3.6–5.1)
Alkaline phosphatase (APISO): 79 U/L (ref 37–153)
BUN: 14 mg/dL (ref 7–25)
CO2: 27 mmol/L (ref 20–32)
Calcium: 9.9 mg/dL (ref 8.6–10.4)
Chloride: 96 mmol/L — ABNORMAL LOW (ref 98–110)
Creat: 0.78 mg/dL (ref 0.60–1.00)
Globulin: 2.2 g/dL (calc) (ref 1.9–3.7)
Glucose, Bld: 96 mg/dL (ref 65–99)
Potassium: 4 mmol/L (ref 3.5–5.3)
Sodium: 132 mmol/L — ABNORMAL LOW (ref 135–146)
Total Bilirubin: 0.4 mg/dL (ref 0.2–1.2)
Total Protein: 6.6 g/dL (ref 6.1–8.1)
eGFR: 78 mL/min/{1.73_m2} (ref >=60)

## 2021-01-23 LAB — CBC WITH DIFFERENTIAL/PLATELET
Absolute Monocytes: 790 cells/uL (ref 200–950)
Basophils Absolute: 30 cells/uL (ref 0–200)
Basophils Relative: 0.4 %
Eosinophils Absolute: 357 cells/uL (ref 15–500)
Eosinophils Relative: 4.7 %
HCT: 41.6 % (ref 35.0–45.0)
Hemoglobin: 13.6 g/dL (ref 11.7–15.5)
Lymphs Abs: 2500 cells/uL (ref 850–3900)
MCH: 29 pg (ref 27.0–33.0)
MCHC: 32.7 g/dL (ref 32.0–36.0)
MCV: 88.7 fL (ref 80.0–100.0)
MPV: 9.9 fL (ref 7.5–12.5)
Monocytes Relative: 10.4 %
Neutro Abs: 3922 cells/uL (ref 1500–7800)
Neutrophils Relative %: 51.6 %
Platelets: 211 10*3/uL (ref 140–400)
RBC: 4.69 10*6/uL (ref 3.80–5.10)
RDW: 12.9 % (ref 11.0–15.0)
Total Lymphocyte: 32.9 %
WBC: 7.6 10*3/uL (ref 3.8–10.8)

## 2021-01-25 ENCOUNTER — Ambulatory Visit (INDEPENDENT_AMBULATORY_CARE_PROVIDER_SITE_OTHER): Payer: PPO

## 2021-01-25 DIAGNOSIS — I495 Sick sinus syndrome: Secondary | ICD-10-CM

## 2021-01-25 LAB — CUP PACEART REMOTE DEVICE CHECK
Date Time Interrogation Session: 20220726010929
Implantable Pulse Generator Implant Date: 20190419

## 2021-02-12 ENCOUNTER — Other Ambulatory Visit: Payer: Self-pay

## 2021-02-12 ENCOUNTER — Ambulatory Visit: Payer: PPO | Admitting: Internal Medicine

## 2021-02-12 VITALS — BP 136/78 | HR 50 | Ht 64.0 in | Wt 151.6 lb

## 2021-02-12 DIAGNOSIS — I4819 Other persistent atrial fibrillation: Secondary | ICD-10-CM | POA: Diagnosis not present

## 2021-02-12 DIAGNOSIS — I10 Essential (primary) hypertension: Secondary | ICD-10-CM | POA: Diagnosis not present

## 2021-02-12 DIAGNOSIS — I4892 Unspecified atrial flutter: Secondary | ICD-10-CM

## 2021-02-12 DIAGNOSIS — E785 Hyperlipidemia, unspecified: Secondary | ICD-10-CM

## 2021-02-12 MED ORDER — METOPROLOL SUCCINATE ER 25 MG PO TB24: 12.5000 mg | ORAL_TABLET | ORAL | 0 refills | Status: DC

## 2021-02-12 NOTE — Patient Instructions (Addendum)
Medication Instructions:  Take 12.5 mg every other day for 4 weeks, then stop.  Your physician recommends that you continue on your current medications as directed. Please refer to the Current Medication list given to you today.  Labwork: None ordered.  Testing/Procedures: None ordered.  Follow-Up: Your physician wants you to follow-up in: 02/18/22 at 2 pm with Thompson Grayer, MD   Any Other Special Instructions Will Be Listed Below (If Applicable).  If you need a refill on your cardiac medications before your next appointment, please call your pharmacy.

## 2021-02-12 NOTE — Progress Notes (Signed)
PCP: [name], MD   Primary EP: Dr Talbot Grumbling Erika Lee is a 75 y.o. female who presents today for routine electrophysiology followup.  Since last being seen in our clinic, the patient reports doing reasonably well.  Her husband is elderly and frail.  She is spending a lot of time providing care to him.Today, she denies symptoms of palpitations, chest pain, shortness of breath,  lower extremity edema, dizziness, presyncope, or syncope.  The patient is otherwise without complaint today.   Past Medical History:  Diagnosis Date   Anticoagulant long-term use    eliquis   Chronic sinusitis    GERD (gastroesophageal reflux disease)    Hemorrhoids    History of colitis 10/2016   campylobacter   Hyperlipidemia    Hypertension    Multinodular goiter    per pt has had 2 biopsy's both benign   NSTEMI (non-ST elevated myocardial infarction) ([place])    12/2018- normal coronary arteries on cath, poss coronary vasospasm.    Osteoarthritis    "all over"  and [facility] joint left shoulder   Paroxysmal atrial fibrillation [facility]) EP cardiologist--  [name]   s/p PVI by [name] 07/16/2013;   pt first dx 2008,  recurrence 2014 afib/flutter and tachy-brady   Rotator cuff tear, left    Shoulder impingement, left    Status post placement of implantable loop recorder    original placement 07-26-2014;  removal / replacement 10-17-2017  by dr    Type 2 diabetes mellitus ([place])    followed by pcp   Past Surgical History:  Procedure Laterality Date   ATRIAL FIBRILLATION ABLATION N/A 07/16/2013   PVI and CTI ablation by [name]   CARDIAC CATHETERIZATION     01/11/2019- Normal coronary arteries.    COLONOSCOPY  09/10/2011   Dr. Arnoldo Morale: normal, 10 year follow up.   LEFT HEART CATH AND CORONARY ANGIOGRAPHY N/A 01/11/2019   Procedure: LEFT HEART CATH AND CORONARY ANGIOGRAPHY;  Surgeon: Lorretta Harp, MD;  Location: [place] CV [facility];  Service: Cardiovascular;  Laterality: N/A;   LESION REMOVAL  N/A 05/03/2013   Procedure: EXCISION CYST, BACK;  Surgeon: Jamesetta So, MD;  Location: [facility];  Service: General;  Laterality: N/A;   LOOP RECORDER IMPLANT N/A 07/26/2014   Procedure: LOOP RECORDER IMPLANT;  Surgeon: Thompson Grayer, MD;  Location: [facility] CATH LAB;  Service: Cardiovascular;  Laterality: N/A;   LOOP RECORDER INSERTION N/A 10/17/2017   MDT previously implanted ILR for afib management at [facility].  old device removed and new device placed by [name]   LOOP RECORDER REMOVAL N/A 10/17/2017   MDT ILR removed with new device subsequently replaced   SHOULDER ARTHROSCOPY WITH ROTATOR CUFF REPAIR AND SUBACROMIAL DECOMPRESSION Left 01/07/2019   Procedure: Left shoulder subacromial decompression, distal clavicle resection, extensive debridement,;  Surgeon: Nicholes Stairs, MD;  Location: [facility];  Service: Orthopedics;  Laterality: Left;   TEE WITHOUT CARDIOVERSION N/A 07/15/2013   Procedure: TRANSESOPHAGEAL ECHOCARDIOGRAM (TEE);  Surgeon: Lelon Perla, MD;  Location: [facility] ENDOSCOPY;  Service: Cardiovascular;  Laterality: N/A;   TONSILLECTOMY  age 10   TRANSANAL HEMORRHOIDAL DEARTERIALIZATION N/A 09/22/2015   Procedure: TRANSANAL HEMORRHOIDAL LIGATION/PEXY EUA POSSIBLE HEMORRHOIDECTOMY ;  Surgeon: Michael Boston, MD;  Location: [facility];  Service: General;  Laterality: N/A;    ROS- all systems are reviewed and negatives except as per HPI above  Current Outpatient Medications  Medication Sig Dispense Refill   amLODipine (NORVASC) 5  MG tablet TAKE 1 TABLET(5 MG) BY MOUTH DAILY 90 tablet 3   diclofenac Sodium (VOLTAREN) 1 % GEL Apply 2 g topically 4 (four) times daily. 100 g 2   ELIQUIS 5 MG TABS tablet TAKE 1 TABLET BY MOUTH TWICE DAILY 60 tablet 3   esomeprazole (NEXIUM) 20 MG capsule Take 20 mg by mouth daily.      Glucose Blood (BLOOD GLUCOSE TEST STRIPS) STRP Use as directed to monitor FSBS 1x daily. Dx: E11.9. please dispense as Freestyle Precision Neo 50 strip 11    hydrochlorothiazide (HYDRODIURIL) 25 MG tablet TAKE 1 TABLET BY MOUTH EVERY MORNING FOR FLUID RETENTION 90 tablet 3   HYDROcodone-acetaminophen (NORCO) 10-325 MG tablet Take 1 tablet by mouth every 6 (six) hours as needed. 120 tablet 0   Lancets (FREESTYLE) lancets USE TO CHECK BLOOD SUGAR DAILY 100 each 2   lisinopril (ZESTRIL) 40 MG tablet Take 1 tablet (40 mg total) by mouth daily. 90 tablet 3   metFORMIN (GLUCOPHAGE) 500 MG tablet Take 1 tablet (500 mg total) by mouth daily. 90 tablet 3   metoprolol succinate (TOPROL-XL) 25 MG 24 hr tablet Take 0.5 tablets (12.5 mg total) by mouth daily. 45 tablet 3   nitroGLYCERIN (NITROSTAT) 0.4 MG SL tablet PLACE 1 TABLET UNDER THE TONGUE EVERY 5 MINUTES AS NEEDED FOR CHEST PAIN 25 tablet 3   Potassium Gluconate 550 MG TABS Take 550 mg by mouth at bedtime.      promethazine (PHENERGAN) 12.5 MG tablet Take 1 tablet (12.5 mg total) by mouth every 6 (six) hours as needed for nausea or vomiting. 30 tablet 3   simvastatin (ZOCOR) 40 MG tablet TAKE 1 TABLET(40 MG) BY MOUTH AT BEDTIME 90 tablet 1   spironolactone (ALDACTONE) 25 MG tablet Take 0.5 tablets (12.5 mg total) by mouth daily.     No current facility-administered medications for this visit.    Physical Exam: Vitals:   02/12/21 1347  BP: 136/78  Pulse: (!) 50  SpO2: 97%  Weight: 151 lb 9.6 oz (68.8 kg)  Height: '5\' 4"'$  (1.626 m)    GEN- The patient is well appearing, alert and oriented x 3 today.   Head- normocephalic, atraumatic Eyes-  Sclera clear, conjunctiva pink Ears- hearing intact Oropharynx- clear Lungs- Clear to ausculation bilaterally, normal work of breathing Heart- Regular rate and rhythm, no murmurs, rubs or gallops, PMI not laterally displaced GI- soft, NT, ND, + BS Extremities- no clubbing, cyanosis, or edema  Wt Readings from Last 3 Encounters:  02/12/21 151 lb 9.6 oz (68.8 kg)  01/22/21 149 lb (67.6 kg)  12/22/20 149 lb (67.6 kg)    EKG tracing ordered today is  personally reviewed and shows sinus bradycardia 50 bpm  Assessment and Plan:  Persistent afib/ atrial flutter Burden 0% by ILR Chads2vasc score is 4.  She is on eliquis Wean toprol to off.  2. HTN Stable No change required today  3. HL Continue zocor '40mg'$  daily  Risks, benefits and potential toxicities for medications prescribed and/or refilled reviewed with patient today.   Return in a year  Thompson Grayer MD, [facility] 02/12/2021 1:57 PM

## 2021-02-14 ENCOUNTER — Other Ambulatory Visit: Payer: Self-pay | Admitting: Internal Medicine

## 2021-02-14 NOTE — Telephone Encounter (Signed)
metoprolol succinate (TOPROL XL) 25 MG 24 hr tablet Medication Date: 02/12/2021 Department: [address] Office Ordering/Authorizing: Thompson Grayer, MD   Order Providers  Prescribing Provider Encounter Provider  Thompson Grayer, MD Thompson Grayer, MD   Outpatient Medication Detail   Disp Refills Start End   metoprolol succinate (TOPROL XL) 25 MG 24 hr tablet 7 tablet 0 02/12/2021 03/12/2021   Sig - Route: Take 0.5 tablets (12.5 mg total) by mouth every other day for 28 days. - Oral   Sent to pharmacy as: metoprolol succinate (TOPROL XL) 25 MG 24 hr tablet   E-Prescribing Status: Receipt confirmed by pharmacy (02/12/2021  2:11 PM EDT)    Pharmacy  Between [phone] - [place], [place]. [address]   Additional Information  Associated Chief Technology Officer and Order Details

## 2021-02-19 ENCOUNTER — Other Ambulatory Visit: Payer: Self-pay | Admitting: Family Medicine

## 2021-02-19 MED ORDER — HYDROCODONE-ACETAMINOPHEN 10-325 MG PO TABS: 1.0000 | ORAL_TABLET | Freq: Four times a day (QID) | ORAL | 0 refills | Status: DC | PRN

## 2021-02-20 NOTE — Progress Notes (Signed)
Carelink Summary Report / Loop Recorder 

## 2021-02-26 ENCOUNTER — Telehealth: Payer: Self-pay | Admitting: Family Medicine

## 2021-02-26 MED ORDER — APIXABAN 5 MG PO TABS: 5.0000 mg | ORAL_TABLET | Freq: Two times a day (BID) | ORAL | 3 refills | Status: DC

## 2021-02-26 NOTE — Telephone Encounter (Signed)
Prescription sent to pharmacy.

## 2021-02-26 NOTE — Telephone Encounter (Signed)
Patient called to follow up on refill request for ELIQUIS 5 MG TABS tablet [id number]  Has two days worth left.   Pharmacy confirmed as [id number] - [place], [address] [place], [place] [address]  Phone:  [phone]  Fax:  [phone]  DEA #:  [id number]   Please advise at [phone]

## 2021-02-27 ENCOUNTER — Ambulatory Visit (INDEPENDENT_AMBULATORY_CARE_PROVIDER_SITE_OTHER): Payer: PPO

## 2021-02-27 DIAGNOSIS — I495 Sick sinus syndrome: Secondary | ICD-10-CM

## 2021-02-27 LAB — CUP PACEART REMOTE DEVICE CHECK
Date Time Interrogation Session: 20220828011356
Implantable Pulse Generator Implant Date: 20190419

## 2021-03-12 NOTE — Progress Notes (Signed)
Carelink Summary Report / Loop Recorder 

## 2021-03-16 ENCOUNTER — Encounter: Payer: Self-pay | Admitting: Family Medicine

## 2021-03-16 ENCOUNTER — Ambulatory Visit (INDEPENDENT_AMBULATORY_CARE_PROVIDER_SITE_OTHER): Payer: PPO | Admitting: Family Medicine

## 2021-03-16 ENCOUNTER — Other Ambulatory Visit: Payer: Self-pay

## 2021-03-16 VITALS — BP 134/80 | HR 58 | Temp 98.1°F | Resp 16 | Ht 64.0 in | Wt 155.0 lb

## 2021-03-16 DIAGNOSIS — J069 Acute upper respiratory infection, unspecified: Secondary | ICD-10-CM

## 2021-03-16 MED ORDER — BENZONATATE 200 MG PO CAPS: 200.0000 mg | ORAL_CAPSULE | Freq: Three times a day (TID) | ORAL | 0 refills | Status: DC | PRN

## 2021-03-16 NOTE — Progress Notes (Signed)
Subjective:    Patient ID: Erika Lee, female    DOB: 1946-02-24, 75 y.o.   MRN: [id number]  Patient reports a 3-day history of runny nose.  She also has a cough that is nonproductive.  She denies any fever.  She denies any sinus pain.  She denies any headaches.  She did have a little bit of a scratchy throat.  She denies any nausea or vomiting or diarrhea.  She has not been taking any medication over-the-counter for this.  She has not performed a COVID test.  She denies any sick contacts. Past Medical History:  Diagnosis Date   Anticoagulant long-term use    eliquis   Chronic sinusitis    GERD (gastroesophageal reflux disease)    Hemorrhoids    History of colitis 10/2016   campylobacter   Hyperlipidemia    Hypertension    Multinodular goiter    per pt has had 2 biopsy's both benign   NSTEMI (non-ST elevated myocardial infarction) ([place])    12/2018- normal coronary arteries on cath, poss coronary vasospasm.    Osteoarthritis    "all over"  and [facility] joint left shoulder   Paroxysmal atrial fibrillation [facility]) EP cardiologist--  [name]   s/p PVI by [name] 07/16/2013;   pt first dx 2008,  recurrence 2014 afib/flutter and tachy-brady   Rotator cuff tear, left    Shoulder impingement, left    Status post placement of implantable loop recorder    original placement 07-26-2014;  removal / replacement 10-17-2017  by dr allred   Type 2 diabetes mellitus ([place])    followed by pcp   Past Surgical History:  Procedure Laterality Date   ATRIAL FIBRILLATION ABLATION N/A 07/16/2013   PVI and CTI ablation by [name]   CARDIAC CATHETERIZATION     01/11/2019- Normal coronary arteries.    COLONOSCOPY  09/10/2011   Dr. Arnoldo Morale: normal, 10 year follow up.   LEFT HEART CATH AND CORONARY ANGIOGRAPHY N/A 01/11/2019   Procedure: LEFT HEART CATH AND CORONARY ANGIOGRAPHY;  Surgeon: Lorretta Harp, MD;  Location: [place] [facility];  Service: Cardiovascular;  Laterality: N/A;   LESION REMOVAL N/A  05/03/2013   Procedure: EXCISION CYST, BACK;  Surgeon: Jamesetta So, MD;  Location: [facility];  Service: General;  Laterality: N/A;   LOOP RECORDER IMPLANT N/A 07/26/2014   Procedure: LOOP RECORDER IMPLANT;  Surgeon: Thompson Grayer, MD;  Location: [facility] CATH LAB;  Service: Cardiovascular;  Laterality: N/A;   LOOP RECORDER INSERTION N/A 10/17/2017   MDT previously implanted ILR for afib management at [facility].  old device removed and new device placed by [name]   LOOP RECORDER REMOVAL N/A 10/17/2017   MDT ILR removed with new device subsequently replaced   SHOULDER ARTHROSCOPY WITH ROTATOR CUFF REPAIR AND SUBACROMIAL DECOMPRESSION Left 01/07/2019   Procedure: Left shoulder subacromial decompression, distal clavicle resection, extensive debridement,;  Surgeon: Nicholes Stairs, MD;  Location: [facility];  Service: Orthopedics;  Laterality: Left;   TEE WITHOUT CARDIOVERSION N/A 07/15/2013   Procedure: TRANSESOPHAGEAL ECHOCARDIOGRAM (TEE);  Surgeon: Lelon Perla, MD;  Location: [facility] ENDOSCOPY;  Service: Cardiovascular;  Laterality: N/A;   TONSILLECTOMY  age 32   TRANSANAL HEMORRHOIDAL DEARTERIALIZATION N/A 09/22/2015   Procedure: TRANSANAL HEMORRHOIDAL LIGATION/PEXY EUA POSSIBLE HEMORRHOIDECTOMY ;  Surgeon: Michael Boston, MD;  Location: [facility];  Service: General;  Laterality: N/A;   Current Outpatient Medications on File Prior to Visit  Medication Sig Dispense Refill  amLODipine (NORVASC) 5 MG tablet TAKE 1 TABLET(5 MG) BY MOUTH DAILY 90 tablet 3   apixaban (ELIQUIS) 5 MG TABS tablet Take 1 tablet (5 mg total) by mouth 2 (two) times daily. 60 tablet 3   diclofenac Sodium (VOLTAREN) 1 % GEL Apply 2 g topically 4 (four) times daily. 100 g 2   esomeprazole (NEXIUM) 20 MG capsule Take 20 mg by mouth daily.      Glucose Blood (BLOOD GLUCOSE TEST STRIPS) STRP Use as directed to monitor FSBS 1x daily. Dx: E11.9. please dispense as Freestyle Precision Neo 50 strip 11   hydrochlorothiazide  (HYDRODIURIL) 25 MG tablet TAKE 1 TABLET BY MOUTH EVERY MORNING FOR FLUID RETENTION 90 tablet 3   HYDROcodone-acetaminophen (NORCO) 10-325 MG tablet Take 1 tablet by mouth every 6 (six) hours as needed. 120 tablet 0   Lancets (FREESTYLE) lancets USE TO CHECK BLOOD SUGAR DAILY 100 each 2   lisinopril (ZESTRIL) 40 MG tablet Take 1 tablet (40 mg total) by mouth daily. 90 tablet 3   metFORMIN (GLUCOPHAGE) 500 MG tablet Take 1 tablet (500 mg total) by mouth daily. 90 tablet 3   metoprolol succinate (TOPROL XL) 25 MG 24 hr tablet Take 0.5 tablets (12.5 mg total) by mouth every other day for 28 days. 7 tablet 0   nitroGLYCERIN (NITROSTAT) 0.4 MG SL tablet PLACE 1 TABLET UNDER THE TONGUE EVERY 5 MINUTES AS NEEDED FOR CHEST PAIN 25 tablet 3   Potassium Gluconate 550 MG TABS Take 550 mg by mouth at bedtime.      promethazine (PHENERGAN) 12.5 MG tablet Take 1 tablet (12.5 mg total) by mouth every 6 (six) hours as needed for nausea or vomiting. 30 tablet 3   simvastatin (ZOCOR) 40 MG tablet TAKE 1 TABLET(40 MG) BY MOUTH AT BEDTIME 90 tablet 1   spironolactone (ALDACTONE) 25 MG tablet Take 0.5 tablets (12.5 mg total) by mouth daily.     No current facility-administered medications on file prior to visit.   Allergies  Allergen Reactions   Clindamycin/Lincomycin Rash   Doxycycline Other (See Comments)    Makes heart race   Keflex [Cephalexin] Nausea And Vomiting   Other    Sulfonic Acid (3,5-Dibromo-4-H-Ox-Benz)    Adhesive [Tape] Rash   Latex Rash   Sulfonamide Derivatives Nausea And Vomiting   Social History   Socioeconomic History   Marital status: Married    Spouse name: Not on file   Number of children: Not on file   Years of education: Not on file   Highest education level: Not on file  Occupational History   Occupation: Pensions consultant: RETIRED    Comment: Full time  Tobacco Use   Smoking status: Former    Years: 34.00    Types: Cigarettes    Quit date: 01/02/1994     Years since quitting: 27.2   Smokeless tobacco: Never  Vaping Use   Vaping Use: Never used  Substance and Sexual Activity   Alcohol use: No   Drug use: No   Sexual activity: Not on file  Other Topics Concern   Not on file  Social History Narrative   Lives with spouse in [place] Determinants of Health   Financial Resource Strain: Low Risk    Difficulty of Paying Living Expenses: Not hard at all  Food Insecurity: No Food Insecurity   Worried About [place] in the Last Year: Never true   [place] in the  Last Year: Never true  Transportation Needs: No Transportation Needs   Lack of Transportation (Medical): No   Lack of Transportation (Non-Medical): No  Physical Activity: Inactive   Days of Exercise per Week: 0 days   Minutes of Exercise per Session: 0 min  Stress: No Stress Concern Present   Feeling of Stress : Not at all  Social Connections: Moderately Integrated   Frequency of Communication with Friends and Family: Three times a week   Frequency of Social Gatherings with Friends and Family: Once a week   Attends Religious Services: More than 4 times per year   Active Member of [employer] or Organizations: No   Attends Archivist Meetings: Never   Marital Status: Married  Human resources officer Violence: Not At Risk   Fear of Current or Ex-Partner: No   Emotionally Abused: No   Physically Abused: No   Sexually Abused: No     Review of Systems  All other systems reviewed and are negative.     Objective:   Physical Exam Vitals reviewed.  Constitutional:      General: She is not in acute distress.    Appearance: Normal appearance. She is not ill-appearing or toxic-appearing.  Eyes:     Extraocular Movements: Extraocular movements intact.     Conjunctiva/sclera: Conjunctivae normal.     Pupils: Pupils are equal, round, and reactive to light.  Cardiovascular:     Rate and Rhythm: Normal rate and regular rhythm.     Pulses: Normal  pulses.     Heart sounds: Normal heart sounds. No murmur heard.   No friction rub. No gallop.  Pulmonary:     Effort: Pulmonary effort is normal. No respiratory distress.     Breath sounds: Normal breath sounds. No stridor. No wheezing, rhonchi or rales.  Abdominal:     General: Abdomen is flat. Bowel sounds are normal. There is no distension.     Palpations: Abdomen is soft.     Tenderness: There is no abdominal tenderness. There is no guarding.  Musculoskeletal:     Cervical back: Neck supple.     Right lower leg: No edema.     Left lower leg: No edema.  Skin:    General: Skin is warm.     Coloration: Skin is not jaundiced.     Findings: No bruising, erythema, lesion or rash.  Neurological:     General: No focal deficit present.     Mental Status: She is alert and oriented to person, place, and time.     Cranial Nerves: No cranial nerve deficit.     Sensory: No sensory deficit.     Motor: No weakness.     Coordination: Coordination normal.     Gait: Gait normal.     Deep Tendon Reflexes: Reflexes normal.  Psychiatric:        Mood and Affect: Mood normal.        Thought Content: Thought content normal.          Assessment & Plan:  Viral upper respiratory tract infection - Plan: SARS-COV-2 RNA,(COVID-19) QUAL NAAT Aside from some clear rhinorrhea, her exam today is unremarkable.  I believe that the patient most likely has a viral upper respiratory infection.  Given the current situation I did screen her for COVID.  I would recommend symptomatic treatment.  I recommended Coricidin HBP for head congestion and rhinorrhea.  I recommended Tessalon Perles 200 mg every 8 hours as needed for cough.  Recheck if symptoms  worsen.

## 2021-03-17 LAB — SARS-COV-2 RNA,(COVID-19) QUALITATIVE NAAT: SARS CoV2 RNA: DETECTED — AB

## 2021-03-23 ENCOUNTER — Other Ambulatory Visit: Payer: Self-pay | Admitting: Family Medicine

## 2021-03-23 ENCOUNTER — Ambulatory Visit (INDEPENDENT_AMBULATORY_CARE_PROVIDER_SITE_OTHER): Payer: PPO | Admitting: *Deleted

## 2021-03-23 DIAGNOSIS — Z23 Encounter for immunization: Secondary | ICD-10-CM | POA: Diagnosis not present

## 2021-03-23 MED ORDER — HYDROCODONE-ACETAMINOPHEN 10-325 MG PO TABS: 1.0000 | ORAL_TABLET | Freq: Four times a day (QID) | ORAL | 0 refills | Status: DC | PRN

## 2021-03-27 ENCOUNTER — Telehealth: Payer: Self-pay

## 2021-03-27 NOTE — Telephone Encounter (Signed)
LINQ alert received.  Device has reached [facility] 9/26.  Patient called and made aware. Would like to have device explanted. Advised a scheduler will call to set up apt. Also, patient only has P[address]O. Box (no mailbox at her home), she will bring the monitor back in when she comes for explant.

## 2021-04-17 ENCOUNTER — Other Ambulatory Visit: Payer: Self-pay | Admitting: Family Medicine

## 2021-04-24 ENCOUNTER — Ambulatory Visit (INDEPENDENT_AMBULATORY_CARE_PROVIDER_SITE_OTHER): Payer: PPO | Admitting: Family Medicine

## 2021-04-24 ENCOUNTER — Other Ambulatory Visit: Payer: Self-pay

## 2021-04-24 VITALS — BP 120/60 | HR 52 | Temp 98.3°F | Wt 155.6 lb

## 2021-04-24 DIAGNOSIS — I2510 Atherosclerotic heart disease of native coronary artery without angina pectoris: Secondary | ICD-10-CM

## 2021-04-24 DIAGNOSIS — I480 Paroxysmal atrial fibrillation: Secondary | ICD-10-CM

## 2021-04-24 DIAGNOSIS — I10 Essential (primary) hypertension: Secondary | ICD-10-CM

## 2021-04-24 DIAGNOSIS — M25562 Pain in left knee: Secondary | ICD-10-CM | POA: Diagnosis not present

## 2021-04-24 DIAGNOSIS — E118 Type 2 diabetes mellitus with unspecified complications: Secondary | ICD-10-CM

## 2021-04-24 MED ORDER — HYDROCODONE-ACETAMINOPHEN 10-325 MG PO TABS: 1.0000 | ORAL_TABLET | Freq: Four times a day (QID) | ORAL | 0 refills | Status: DC | PRN

## 2021-04-24 NOTE — Progress Notes (Signed)
Subjective:    Patient ID: Erika Lee, female    DOB: 27-Jul-1945, 75 y.o.   MRN: [id number]  Patient is a very pleasant 75 year old female here today for a checkup.  She has a history of paroxysmal atrial fibrillation.  Today she is in normal sinus rhythm however her heart rate is well controlled.  She denies any bradycardia or lightheadedness or syncope or near syncope.  Her blood pressure is well controlled today as well.  She does have a history of a non-STEMI x2.  She denies any chest pain, shortness of breath, or dyspnea on exertion.  She also has a history of type 2 diabetes.  She is due today to recheck her A1c.  She does complain of pain in her left knee.  She was standing up from a seated position when she felt a pop and a sudden sharp stabbing pain in her left knee directly below her patella.  She now reports pain under the patella with standing and walking.  She has no tenderness to palpation around the joint line.  There is no laxity to varus or valgus stress.  She denies any locking or instability however she does complain of pain with walking Past Medical History:  Diagnosis Date   Anticoagulant long-term use    eliquis   Chronic sinusitis    GERD (gastroesophageal reflux disease)    Hemorrhoids    History of colitis 10/2016   campylobacter   Hyperlipidemia    Hypertension    Multinodular goiter    per pt has had 2 biopsy's both benign   NSTEMI (non-ST elevated myocardial infarction) ([place])    12/2018- normal coronary arteries on cath, poss coronary vasospasm.    Osteoarthritis    "all over"  and [facility] joint left shoulder   Paroxysmal atrial fibrillation [facility]) EP cardiologist--  [name]   s/p PVI by [name] 07/16/2013;   pt first dx 2008,  recurrence 2014 afib/flutter and tachy-brady   Rotator cuff tear, left    Shoulder impingement, left    Status post placement of implantable loop recorder    original placement 07-26-2014;  removal / replacement 10-17-2017  by dr allred    Type 2 diabetes mellitus ([name])    followed by pcp   Past Surgical History:  Procedure Laterality Date   ATRIAL FIBRILLATION ABLATION N/A 07/16/2013   PVI and CTI ablation by [name]   CARDIAC CATHETERIZATION     01/11/2019- Normal coronary arteries.    COLONOSCOPY  09/10/2011   Dr. Arnoldo Morale: normal, 10 year follow up.   LEFT HEART CATH AND CORONARY ANGIOGRAPHY N/A 01/11/2019   Procedure: LEFT HEART CATH AND CORONARY ANGIOGRAPHY;  Surgeon: Lorretta Harp, MD;  Location: [place] CV LAB;  Service: Cardiovascular;  Laterality: N/A;   LESION REMOVAL N/A 05/03/2013   Procedure: EXCISION CYST, BACK;  Surgeon: Jamesetta So, MD;  Location: [facility];  Service: General;  Laterality: N/A;   LOOP RECORDER IMPLANT N/A 07/26/2014   Procedure: LOOP RECORDER IMPLANT;  Surgeon: Thompson Grayer, MD;  Location: [facility] CATH LAB;  Service: Cardiovascular;  Laterality: N/A;   LOOP RECORDER INSERTION N/A 10/17/2017   MDT previously implanted ILR for afib management at [facility].  old device removed and new device placed by [name]   LOOP RECORDER REMOVAL N/A 10/17/2017   MDT ILR removed with new device subsequently replaced   SHOULDER ARTHROSCOPY WITH ROTATOR CUFF REPAIR AND SUBACROMIAL DECOMPRESSION Left 01/07/2019   Procedure: Left shoulder subacromial  decompression, distal clavicle resection, extensive debridement,;  Surgeon: Nicholes Stairs, MD;  Location: [facility];  Service: Orthopedics;  Laterality: Left;   TEE WITHOUT CARDIOVERSION N/A 07/15/2013   Procedure: TRANSESOPHAGEAL ECHOCARDIOGRAM (TEE);  Surgeon: Lelon Perla, MD;  Location: [facility] ENDOSCOPY;  Service: Cardiovascular;  Laterality: N/A;   TONSILLECTOMY  age 68   TRANSANAL HEMORRHOIDAL DEARTERIALIZATION N/A 09/22/2015   Procedure: TRANSANAL HEMORRHOIDAL LIGATION/PEXY EUA POSSIBLE HEMORRHOIDECTOMY ;  Surgeon: Michael Boston, MD;  Location: [facility] ORS;  Service: General;  Laterality: N/A;   Current Outpatient Medications on File Prior to  Visit  Medication Sig Dispense Refill   amLODipine (NORVASC) 5 MG tablet TAKE 1 TABLET(5 MG) BY MOUTH DAILY 90 tablet 3   apixaban (ELIQUIS) 5 MG TABS tablet Take 1 tablet (5 mg total) by mouth 2 (two) times daily. 60 tablet 3   benzonatate (TESSALON) 200 MG capsule Take 1 capsule (200 mg total) by mouth 3 (three) times daily as needed for cough. 20 capsule 0   diclofenac Sodium (VOLTAREN) 1 % GEL Apply 2 g topically 4 (four) times daily. 100 g 2   esomeprazole (NEXIUM) 20 MG capsule Take 20 mg by mouth daily.      Glucose Blood (BLOOD GLUCOSE TEST STRIPS) STRP Use as directed to monitor FSBS 1x daily. Dx: E11.9. please dispense as Freestyle Precision Neo 50 strip 11   hydrochlorothiazide (HYDRODIURIL) 25 MG tablet TAKE 1 TABLET BY MOUTH EVERY MORNING FOR FLUID RETENTION 90 tablet 3   HYDROcodone-acetaminophen (NORCO) 10-325 MG tablet Take 1 tablet by mouth every 6 (six) hours as needed. 120 tablet 0   Lancets (FREESTYLE) lancets USE TO CHECK BLOOD SUGAR DAILY 100 each 2   lisinopril (ZESTRIL) 40 MG tablet Take 1 tablet (40 mg total) by mouth daily. 90 tablet 3   metFORMIN (GLUCOPHAGE) 500 MG tablet Take 1 tablet (500 mg total) by mouth daily. 90 tablet 3   nitroGLYCERIN (NITROSTAT) 0.4 MG SL tablet PLACE 1 TABLET UNDER THE TONGUE EVERY 5 MINUTES AS NEEDED FOR CHEST PAIN 25 tablet 3   Potassium Gluconate 550 MG TABS Take 550 mg by mouth at bedtime.      promethazine (PHENERGAN) 12.5 MG tablet Take 1 tablet (12.5 mg total) by mouth every 6 (six) hours as needed for nausea or vomiting. 30 tablet 3   simvastatin (ZOCOR) 40 MG tablet TAKE 1 TABLET(40 MG) BY MOUTH AT BEDTIME 90 tablet 1   metoprolol succinate (TOPROL XL) 25 MG 24 hr tablet Take 0.5 tablets (12.5 mg total) by mouth every other day for 28 days. 7 tablet 0   spironolactone (ALDACTONE) 25 MG tablet Take 0.5 tablets (12.5 mg total) by mouth daily.     No current facility-administered medications on file prior to visit.   Allergies   Allergen Reactions   Clindamycin/Lincomycin Rash   Doxycycline Other (See Comments)    Makes heart race   Keflex [Cephalexin] Nausea And Vomiting   Other    Sulfonic Acid (3,5-Dibromo-4-H-Ox-Benz)    Adhesive [Tape] Rash   Latex Rash   Sulfonamide Derivatives Nausea And Vomiting   Social History   Socioeconomic History   Marital status: Married    Spouse name: Not on file   Number of children: Not on file   Years of education: Not on file   Highest education level: Not on file  Occupational History   Occupation: Pensions consultant: RETIRED    Comment: Full time  Tobacco Use   Smoking  status: Former    Years: 34.00    Types: Cigarettes    Quit date: 01/02/1994    Years since quitting: 27.3   Smokeless tobacco: Never  Vaping Use   Vaping Use: Never used  Substance and Sexual Activity   Alcohol use: No   Drug use: No   Sexual activity: Not on file  Other Topics Concern   Not on file  Social History Narrative   Lives with spouse in [place]   Social Determinants of Health   Financial Resource Strain: Low Risk    Difficulty of Paying Living Expenses: Not hard at all  Food Insecurity: No Food Insecurity   Worried About Charity fundraiser in the Last Year: Never true   [place] in the Last Year: Never true  Transportation Needs: No Transportation Needs   Lack of Transportation (Medical): No   Lack of Transportation (Non-Medical): No  Physical Activity: Inactive   Days of Exercise per Week: 0 days   Minutes of Exercise per Session: 0 min  Stress: No Stress Concern Present   Feeling of Stress : Not at all  Social Connections: Moderately Integrated   Frequency of Communication with Friends and Family: Three times a week   Frequency of Social Gatherings with Friends and Family: Once a week   Attends Religious Services: More than 4 times per year   Active Member of [employer] or Organizations: No   Attends Archivist Meetings: Never    Marital Status: Married  Human resources officer Violence: Not At Risk   Fear of Current or Ex-Partner: No   Emotionally Abused: No   Physically Abused: No   Sexually Abused: No     Review of Systems  All other systems reviewed and are negative.     Objective:   Physical Exam Vitals reviewed.  Constitutional:      General: She is not in acute distress.    Appearance: Normal appearance. She is not ill-appearing or toxic-appearing.  Eyes:     Extraocular Movements: Extraocular movements intact.     Conjunctiva/sclera: Conjunctivae normal.     Pupils: Pupils are equal, round, and reactive to light.  Cardiovascular:     Rate and Rhythm: Normal rate and regular rhythm.     Pulses: Normal pulses.     Heart sounds: Normal heart sounds. No murmur heard.   No friction rub. No gallop.  Pulmonary:     Effort: Pulmonary effort is normal. No respiratory distress.     Breath sounds: Normal breath sounds. No stridor. No wheezing, rhonchi or rales.  Abdominal:     General: Abdomen is flat. Bowel sounds are normal. There is no distension.     Palpations: Abdomen is soft.     Tenderness: There is no abdominal tenderness. There is no guarding.  Musculoskeletal:     Cervical back: Neck supple.     Lumbar back: Tenderness present. Decreased range of motion.     Left knee: No effusion. Decreased range of motion. Tenderness present.     Right lower leg: No edema.     Left lower leg: No edema.       Legs:  Skin:    General: Skin is warm.     Coloration: Skin is not jaundiced.     Findings: No bruising, erythema, lesion or rash.  Neurological:     General: No focal deficit present.     Mental Status: She is alert and oriented to person, place, and time.  Cranial Nerves: No cranial nerve deficit.     Sensory: No sensory deficit.     Motor: No weakness.     Coordination: Coordination normal.     Gait: Gait normal.     Deep Tendon Reflexes: Reflexes normal.  Psychiatric:        Mood and  Affect: Mood normal.        Thought Content: Thought content normal.          Assessment & Plan:  Controlled type 2 diabetes mellitus with complication, without long-term current use of insulin ([place]) - Plan: CBC with Differential/Platelet, COMPLETE METABOLIC PANEL WITH GFR, Lipid panel, Hemoglobin A1c  CAD in native artery  Essential hypertension, benign  Paroxysmal atrial fibrillation (HCC)  Acute pain of left knee blood pressure today is excellent.  Check CBC CMP lipid panel and A1c.  Given her history of coronary artery disease, her goal LDL cholesterol is less than 70.  Check a hemoglobin A1c.  Goal hemoglobin A1c is less than 6.5.  Patient is currently in normal sinus rhythm.  She is still appropriately anticoagulated with Eliquis.  Regarding her preventative care, she is due for mammogram as well as a bone density test.  She is scheduled to see her cardiologist today to determine if they are going to remove her loop recorder.  She states that she will call me back.  If they remove her loop recorder, she will then asked me to schedule her for mammogram and bone density test but she wants to touch base with her cardiologist first.  Therefore I will await her call.  I believe that she likely has some inflammation on the undersurface of the patella.  She is unable to take NSAIDs due to her chronic anticoagulation for paroxysmal atrial fibrillation.  Therefore I will give the patient a cortisone shot in the knee today.  Using sterile technique, I injected the left knee with 2 cc lidocaine, 2 cc of Marcaine, and 2 cc of 40 mg/mL Kenalog.

## 2021-04-25 ENCOUNTER — Ambulatory Visit: Payer: PPO | Admitting: Internal Medicine

## 2021-04-25 ENCOUNTER — Encounter: Payer: Self-pay | Admitting: Internal Medicine

## 2021-04-25 VITALS — BP 136/60 | HR 60 | Ht 65.0 in | Wt 156.4 lb

## 2021-04-25 DIAGNOSIS — I10 Essential (primary) hypertension: Secondary | ICD-10-CM

## 2021-04-25 DIAGNOSIS — I4819 Other persistent atrial fibrillation: Secondary | ICD-10-CM | POA: Diagnosis not present

## 2021-04-25 DIAGNOSIS — I4892 Unspecified atrial flutter: Secondary | ICD-10-CM

## 2021-04-25 DIAGNOSIS — E785 Hyperlipidemia, unspecified: Secondary | ICD-10-CM | POA: Diagnosis not present

## 2021-04-25 HISTORY — PX: OTHER SURGICAL HISTORY: SHX169

## 2021-04-25 LAB — LIPID PANEL
Cholesterol: 132 mg/dL (ref <200)
HDL: 55 mg/dL (ref >=50)
LDL Cholesterol (Calc): 60 mg/dL (calc)
Non-HDL Cholesterol (Calc): 77 mg/dL (calc) (ref <130)
Total CHOL/HDL Ratio: 2.4 (calc) (ref <5.0)
Triglycerides: 90 mg/dL (ref <150)

## 2021-04-25 LAB — CBC WITH DIFFERENTIAL/PLATELET
Absolute Monocytes: 623 cells/uL (ref 200–950)
Basophils Absolute: 47 cells/uL (ref 0–200)
Basophils Relative: 0.7 %
Eosinophils Absolute: 308 cells/uL (ref 15–500)
Eosinophils Relative: 4.6 %
HCT: 41.4 % (ref 35.0–45.0)
Hemoglobin: 13.6 g/dL (ref 11.7–15.5)
Lymphs Abs: 2258 cells/uL (ref 850–3900)
MCH: 29 pg (ref 27.0–33.0)
MCHC: 32.9 g/dL (ref 32.0–36.0)
MCV: 88.3 fL (ref 80.0–100.0)
MPV: 9.9 fL (ref 7.5–12.5)
Monocytes Relative: 9.3 %
Neutro Abs: 3464 cells/uL (ref 1500–7800)
Neutrophils Relative %: 51.7 %
Platelets: 227 10*3/uL (ref 140–400)
RBC: 4.69 10*6/uL (ref 3.80–5.10)
RDW: 12.2 % (ref 11.0–15.0)
Total Lymphocyte: 33.7 %
WBC: 6.7 10*3/uL (ref 3.8–10.8)

## 2021-04-25 LAB — COMPLETE METABOLIC PANEL WITH GFR
AG Ratio: 1.9 (calc) (ref 1.0–2.5)
ALT: 10 U/L (ref 6–29)
AST: 14 U/L (ref 10–35)
Albumin: 4.3 g/dL (ref 3.6–5.1)
Alkaline phosphatase (APISO): 79 U/L (ref 37–153)
BUN: 9 mg/dL (ref 7–25)
CO2: 26 mmol/L (ref 20–32)
Calcium: 10.1 mg/dL (ref 8.6–10.4)
Chloride: 98 mmol/L (ref 98–110)
Creat: 0.68 mg/dL (ref 0.60–1.00)
Globulin: 2.3 g/dL (calc) (ref 1.9–3.7)
Glucose, Bld: 114 mg/dL — ABNORMAL HIGH (ref 65–99)
Potassium: 4 mmol/L (ref 3.5–5.3)
Sodium: 134 mmol/L — ABNORMAL LOW (ref 135–146)
Total Bilirubin: 0.4 mg/dL (ref 0.2–1.2)
Total Protein: 6.6 g/dL (ref 6.1–8.1)
eGFR: 90 mL/min/{1.73_m2} (ref >=60)

## 2021-04-25 LAB — HEMOGLOBIN A1C
Hgb A1c MFr Bld: 6.1 % of total Hgb — ABNORMAL HIGH (ref <5.7)
Mean Plasma Glucose: 128 mg/dL
eAG (mmol/L): 7.1 mmol/L

## 2021-04-25 MED ORDER — NITROGLYCERIN 0.4 MG SL SUBL: 0.4000 mg | SUBLINGUAL_TABLET | SUBLINGUAL | 3 refills | Status: DC | PRN

## 2021-04-25 NOTE — Progress Notes (Signed)
PCP: [name], MD   Primary EP: Dr Talbot Grumbling Erika Lee is a 75 y.o. female who presents today for routine electrophysiology followup.  Since last being seen in our clinic, the patient reports doing very well.  Today, she denies symptoms of palpitations, chest pain, shortness of breath,  lower extremity edema, dizziness, presyncope, or syncope.  The patient is otherwise without complaint today.   Past Medical History:  Diagnosis Date   Anticoagulant long-term use    eliquis   Chronic sinusitis    GERD (gastroesophageal reflux disease)    Hemorrhoids    History of colitis 10/2016   campylobacter   Hyperlipidemia    Hypertension    Multinodular goiter    per pt has had 2 biopsy's both benign   NSTEMI (non-ST elevated myocardial infarction) ([place])    12/2018- normal coronary arteries on cath, poss coronary vasospasm.    Osteoarthritis    "all over"  and [facility] joint left shoulder   Paroxysmal atrial fibrillation [facility]) EP cardiologist--  [name]   s/p PVI by [name] 07/16/2013;   pt first dx 2008,  recurrence 2014 afib/flutter and tachy-brady   Rotator cuff tear, left    Shoulder impingement, left    Status post placement of implantable loop recorder    original placement 07-26-2014;  removal / replacement 10-17-2017  by dr    Type 2 diabetes mellitus ([place])    followed by pcp   Past Surgical History:  Procedure Laterality Date   ATRIAL FIBRILLATION ABLATION N/A 07/16/2013   PVI and CTI ablation by [name]   CARDIAC CATHETERIZATION     01/11/2019- Normal coronary arteries.    COLONOSCOPY  09/10/2011   Dr. Arnoldo Morale: normal, 10 year follow up.   LEFT HEART CATH AND CORONARY ANGIOGRAPHY N/A 01/11/2019   Procedure: LEFT HEART CATH AND CORONARY ANGIOGRAPHY;  Surgeon: Lorretta Harp, MD;  Location: [place] CV LAB;  Service: Cardiovascular;  Laterality: N/A;   LESION REMOVAL N/A 05/03/2013   Procedure: EXCISION CYST, BACK;  Surgeon: Jamesetta So, MD;  Location: [facility]  ORS;  Service: General;  Laterality: N/A;   LOOP RECORDER IMPLANT N/A 07/26/2014   Procedure: LOOP RECORDER IMPLANT;  Surgeon: Thompson Grayer, MD;  Location: [facility] CATH LAB;  Service: Cardiovascular;  Laterality: N/A;   LOOP RECORDER INSERTION N/A 10/17/2017   MDT previously implanted ILR for afib management at [facility].  old device removed and new device placed by [name]   LOOP RECORDER REMOVAL N/A 10/17/2017   MDT ILR removed with new device subsequently replaced   SHOULDER ARTHROSCOPY WITH ROTATOR CUFF REPAIR AND SUBACROMIAL DECOMPRESSION Left 01/07/2019   Procedure: Left shoulder subacromial decompression, distal clavicle resection, extensive debridement,;  Surgeon: Nicholes Stairs, MD;  Location: [facility];  Service: Orthopedics;  Laterality: Left;   TEE WITHOUT CARDIOVERSION N/A 07/15/2013   Procedure: TRANSESOPHAGEAL ECHOCARDIOGRAM (TEE);  Surgeon: Lelon Perla, MD;  Location: [facility] ENDOSCOPY;  Service: Cardiovascular;  Laterality: N/A;   TONSILLECTOMY  age 79   TRANSANAL HEMORRHOIDAL DEARTERIALIZATION N/A 09/22/2015   Procedure: TRANSANAL HEMORRHOIDAL LIGATION/PEXY EUA POSSIBLE HEMORRHOIDECTOMY ;  Surgeon: Michael Boston, MD;  Location: [facility] ORS;  Service: General;  Laterality: N/A;    ROS- all systems are reviewed and negatives except as per HPI above  Current Outpatient Medications  Medication Sig Dispense Refill   amLODipine (NORVASC) 5 MG tablet TAKE 1 TABLET(5 MG) BY MOUTH DAILY 90 tablet 3   apixaban (ELIQUIS) 5  MG TABS tablet Take 1 tablet (5 mg total) by mouth 2 (two) times daily. 60 tablet 3   benzonatate (TESSALON) 200 MG capsule Take 1 capsule (200 mg total) by mouth 3 (three) times daily as needed for cough. 20 capsule 0   diclofenac Sodium (VOLTAREN) 1 % GEL Apply 2 g topically 4 (four) times daily. 100 g 2   esomeprazole (NEXIUM) 20 MG capsule Take 20 mg by mouth daily.      Glucose Blood (BLOOD GLUCOSE TEST STRIPS) STRP Use as directed to monitor FSBS 1x daily.  Dx: E11.9. please dispense as Freestyle Precision Neo 50 strip 11   hydrochlorothiazide (HYDRODIURIL) 25 MG tablet TAKE 1 TABLET BY MOUTH EVERY MORNING FOR FLUID RETENTION 90 tablet 3   HYDROcodone-acetaminophen (NORCO) 10-325 MG tablet Take 1 tablet by mouth every 6 (six) hours as needed. 120 tablet 0   Lancets (FREESTYLE) lancets USE TO CHECK BLOOD SUGAR DAILY 100 each 2   lisinopril (ZESTRIL) 40 MG tablet Take 1 tablet (40 mg total) by mouth daily. 90 tablet 3   metFORMIN (GLUCOPHAGE) 500 MG tablet Take 1 tablet (500 mg total) by mouth daily. 90 tablet 3   nitroGLYCERIN (NITROSTAT) 0.4 MG SL tablet PLACE 1 TABLET UNDER THE TONGUE EVERY 5 MINUTES AS NEEDED FOR CHEST PAIN 25 tablet 3   Potassium Gluconate 550 MG TABS Take 550 mg by mouth at bedtime.      promethazine (PHENERGAN) 12.5 MG tablet Take 1 tablet (12.5 mg total) by mouth every 6 (six) hours as needed for nausea or vomiting. 30 tablet 3   simvastatin (ZOCOR) 40 MG tablet TAKE 1 TABLET(40 MG) BY MOUTH AT BEDTIME 90 tablet 1   metoprolol succinate (TOPROL XL) 25 MG 24 hr tablet Take 0.5 tablets (12.5 mg total) by mouth every other day for 28 days. 7 tablet 0   spironolactone (ALDACTONE) 25 MG tablet Take 0.5 tablets (12.5 mg total) by mouth daily.     No current facility-administered medications for this visit.    Physical Exam: Vitals:   04/25/21 0834  BP: 136/60  Pulse: 60  SpO2: 97%  Weight: 156 lb 6.4 oz (70.9 kg)  Height: 5\' 5"  (1.651 m)    GEN- The patient is well appearing, alert and oriented x 3 today.   Head- normocephalic, atraumatic Eyes-  Sclera clear, conjunctiva pink Ears- hearing intact Oropharynx- clear Lungs- Clear to ausculation bilaterally, normal work of breathing Heart- Regular rate and rhythm, no murmurs, rubs or gallops, PMI not laterally displaced GI- soft, NT, ND, + BS Extremities- no clubbing, cyanosis, or edema  Wt Readings from Last 3 Encounters:  04/25/21 156 lb 6.4 oz (70.9 kg)  04/24/21  155 lb 9.6 oz (70.6 kg)  03/16/21 155 lb (70.3 kg)    EKG tracing ordered today is personally reviewed and shows sinus  Assessment and Plan:  Persistent atrial fibrillation/ atrial flutter Well controlled 0% by ILR Chads2vasc score is 4. She is on eliquis Her ILR has reached [facility]. She wishes to have this removed. Risks and benefits to ILR removal were discussed at length with the patient who wishes to proceed.  2. HTN Stable No change required today [place] labs reviewed  3. HL Stable Yesterdays lipids reviewed No change required today  Return to see EP APP in 6 months  Thompson Grayer MD, [facility] 04/25/2021 8:48 AM   PROCEDURES:   1. Implantable loop recorder explantation       DESCRIPTION OF PROCEDURE:  Informed written consent was  obtained.  The patient required no sedation for the procedure today.   The patients left chest was therefore prepped and draped in the usual sterile fashion.  The skin overlying the ILR monitor was infiltrated with lidocaine for local analgesia.  A 0.5-cm incision was made over the site.  The previously implanted ILR was exposed and removed using a combination of sharp and blunt dissection.  Steri- Strips and a sterile dressing were then applied. EBL<30ml.  There were no early apparent complications.     CONCLUSIONS:   1. Successful explantation of a [employer]onic Reveal LINQ implantable loop recorder   2. No early apparent complications.        Thompson Grayer MD, [facility] 04/25/2021 8:50 AM

## 2021-04-25 NOTE — Patient Instructions (Addendum)
Medication Instructions:  Your physician recommends that you continue on your current medications as directed. Please refer to the Current Medication list given to you today.  Labwork: None ordered.  Testing/Procedures: None ordered.  Follow-Up:  Your physician wants you to follow-up in:12 months.  You will receive a reminder letter in the mail two months in advance. If you don't receive a letter, please call our office to schedule the follow-up appointment.    Implantable Loop Recorder Removal, Care After This sheet gives you information about how to care for yourself after your procedure. Your health care provider may also give you more specific instructions. If you have problems or questions, contact your health care provider. What can I expect after the procedure? After the procedure, it is common to have: Soreness or discomfort near the incision. Some swelling or bruising near the incision.  Follow these instructions at home: Incision care   Leave your outer dressing on for 24 hours.  After 24 hours you can remove your outer dressing and shower. Leave adhesive strips in place. These skin closures may need to stay in place for 1-2 weeks. If adhesive strip edges start to loosen and curl up, you may trim the loose edges.  You may remove the strips if they have not fallen off after 2 weeks. Check your incision area every day for signs of infection. Check for: Redness, swelling, or pain. Fluid or blood. Warmth. Pus or a bad smell. Do not take baths, swim, or use a hot tub until your incision is completely healed. If your wound site starts to bleed apply pressure.      If you have any questions/concerns please call the device clinic at [phone].  Activity  Return to your normal activities.  Contact a health care provider if: You have redness, swelling, or pain around your incision. You have a fever.

## 2021-04-26 ENCOUNTER — Encounter: Payer: Self-pay | Admitting: *Deleted

## 2021-04-30 ENCOUNTER — Other Ambulatory Visit: Payer: Self-pay | Admitting: Family Medicine

## 2021-06-01 ENCOUNTER — Other Ambulatory Visit: Payer: Self-pay

## 2021-06-01 ENCOUNTER — Ambulatory Visit (INDEPENDENT_AMBULATORY_CARE_PROVIDER_SITE_OTHER): Payer: PPO | Admitting: Family Medicine

## 2021-06-01 ENCOUNTER — Encounter: Payer: Self-pay | Admitting: Family Medicine

## 2021-06-01 VITALS — BP 142/78 | HR 53 | Resp 18 | Ht 65.0 in | Wt 153.0 lb

## 2021-06-01 DIAGNOSIS — R110 Nausea: Secondary | ICD-10-CM

## 2021-06-01 MED ORDER — AZELASTINE HCL 0.1 % NA SOLN: 2.0000 | Freq: Two times a day (BID) | NASAL | 12 refills | Status: DC

## 2021-06-01 MED ORDER — ONDANSETRON HCL 4 MG PO TABS: 4.0000 mg | ORAL_TABLET | Freq: Three times a day (TID) | ORAL | 0 refills | Status: DC | PRN

## 2021-06-01 NOTE — Progress Notes (Signed)
Subjective:    Patient ID: Erika Lee, female    DOB: [birth year], 75 y.o.   MRN: [id number]  Patient reports 3 days of nausea.  She states it began the beginning of the week.  She had a low-grade temperature.  She has been feeling extremely nauseated and tired ever since.  She states that she has a history of stomach irritability.  Her previous PCP had checked an ultrasound to rule out gallstones.  He also did a gastric emptying study which was normal.  She is currently on Nexium and she denies any indigestion.  She denies any melena or hematochezia.  She does report some epigastric discomfort made worse by eating.  She has no history of pancreatitis and she denies any alcohol use.  She denies any fevers or chills or constipation.  She denies any change in her bowel habits Past Medical History:  Diagnosis Date   Anticoagulant long-term use    eliquis   Chronic sinusitis    GERD (gastroesophageal reflux disease)    Hemorrhoids    History of colitis 10/2016   campylobacter   Hyperlipidemia    Hypertension    Multinodular goiter    per pt has had 2 biopsy's both benign   NSTEMI (non-ST elevated myocardial infarction) ([place])    12/2018- normal coronary arteries on cath, poss coronary vasospasm.    Osteoarthritis    "all over"  and [facility] joint left shoulder   Paroxysmal atrial fibrillation [facility]) EP cardiologist--  [name]   s/p PVI by [name] 07/16/2013;   pt first dx 2008,  recurrence 2014 afib/flutter and tachy-brady   Rotator cuff tear, left    Shoulder impingement, left    Status post placement of implantable loop recorder    original placement 07-26-2014;  removal / replacement 10-17-2017  by dr allred   Type 2 diabetes mellitus ([place])    followed by pcp   Past Surgical History:  Procedure Laterality Date   ATRIAL FIBRILLATION ABLATION N/A 07/16/2013   PVI and CTI ablation by [name]   CARDIAC CATHETERIZATION     01/11/2019- Normal coronary arteries.    COLONOSCOPY  09/10/2011    Dr. Arnoldo Morale: normal, 10 year follow up.   implantable loop recorder removal  04/25/2021   MDT LINQ removed by [name]   LEFT HEART CATH AND CORONARY ANGIOGRAPHY N/A 01/11/2019   Procedure: LEFT HEART CATH AND CORONARY ANGIOGRAPHY;  Surgeon: Lorretta Harp, MD;  Location: [place] [facility];  Service: Cardiovascular;  Laterality: N/A;   LESION REMOVAL N/A 05/03/2013   Procedure: EXCISION CYST, BACK;  Surgeon: Jamesetta So, MD;  Location: [facility];  Service: General;  Laterality: N/A;   LOOP RECORDER IMPLANT N/A 07/26/2014   Procedure: LOOP RECORDER IMPLANT;  Surgeon: Thompson Grayer, MD;  Location: [facility] CATH LAB;  Service: Cardiovascular;  Laterality: N/A;   LOOP RECORDER INSERTION N/A 10/17/2017   MDT previously implanted ILR for afib management at [facility].  old device removed and new device placed by [name]   LOOP RECORDER REMOVAL N/A 10/17/2017   MDT ILR removed with new device subsequently replaced   SHOULDER ARTHROSCOPY WITH ROTATOR CUFF REPAIR AND SUBACROMIAL DECOMPRESSION Left 01/07/2019   Procedure: Left shoulder subacromial decompression, distal clavicle resection, extensive debridement,;  Surgeon: Nicholes Stairs, MD;  Location: [facility];  Service: Orthopedics;  Laterality: Left;   TEE WITHOUT CARDIOVERSION N/A 07/15/2013   Procedure: TRANSESOPHAGEAL ECHOCARDIOGRAM (TEE);  Surgeon: Lelon Perla, MD;  [facility]: [facility];  Service: Cardiovascular;  Laterality: N/A;   TONSILLECTOMY  age 35   TRANSANAL HEMORRHOIDAL DEARTERIALIZATION N/A 09/22/2015   Procedure: TRANSANAL HEMORRHOIDAL LIGATION/PEXY EUA POSSIBLE HEMORRHOIDECTOMY ;  Surgeon: Michael Boston, MD;  Location: [facility];  Service: General;  Laterality: N/A;   Current Outpatient Medications on File Prior to Visit  Medication Sig Dispense Refill   amLODipine (NORVASC) 5 MG tablet TAKE 1 TABLET(5 MG) BY MOUTH DAILY 90 tablet 3   apixaban (ELIQUIS) 5 MG TABS tablet Take 1 tablet (5 mg total) by mouth 2  (two) times daily. 60 tablet 3   diclofenac Sodium (VOLTAREN) 1 % GEL Apply 2 g topically 4 (four) times daily. 100 g 2   esomeprazole (NEXIUM) 20 MG capsule Take 20 mg by mouth daily.      Glucose Blood (BLOOD GLUCOSE TEST STRIPS) STRP Use as directed to monitor FSBS 1x daily. Dx: E11.9. please dispense as Freestyle Precision Neo 50 strip 11   hydrochlorothiazide (HYDRODIURIL) 25 MG tablet TAKE 1 TABLET BY MOUTH EVERY MORNING FOR FLUID RETENTION 90 tablet 3   HYDROcodone-acetaminophen (NORCO) 10-325 MG tablet Take 1 tablet by mouth every 6 (six) hours as needed. 120 tablet 0   Lancets (FREESTYLE) lancets USE TO CHECK BLOOD SUGAR DAILY 100 each 2   lisinopril (ZESTRIL) 40 MG tablet Take 1 tablet (40 mg total) by mouth daily. 90 tablet 3   metFORMIN (GLUCOPHAGE) 500 MG tablet TAKE 1 TABLET(500 MG) BY MOUTH DAILY 90 tablet 3   nitroGLYCERIN (NITROSTAT) 0.4 MG SL tablet Place 1 tablet (0.4 mg total) under the tongue every 5 (five) minutes as needed for chest pain (up to 3 tablets, if you are taking the 3rd tablet call 911). 25 tablet 3   Potassium Gluconate 550 MG TABS Take 550 mg by mouth at bedtime.      promethazine (PHENERGAN) 12.5 MG tablet Take 1 tablet (12.5 mg total) by mouth every 6 (six) hours as needed for nausea or vomiting. 30 tablet 3   simvastatin (ZOCOR) 40 MG tablet TAKE 1 TABLET(40 MG) BY MOUTH AT BEDTIME 90 tablet 1   metoprolol succinate (TOPROL XL) 25 MG 24 hr tablet Take 0.5 tablets (12.5 mg total) by mouth every other day for 28 days. 7 tablet 0   No current facility-administered medications on file prior to visit.   Allergies  Allergen Reactions   Clindamycin/Lincomycin Rash   Doxycycline Other (See Comments)    Makes heart race   Keflex [Cephalexin] Nausea And Vomiting   Other    Sulfonic Acid (3,5-Dibromo-4-H-Ox-Benz)    Adhesive [Tape] Rash   Latex Rash   Sulfonamide Derivatives Nausea And Vomiting   Social History   Socioeconomic History   Marital status:  Married    Spouse name: Not on file   Number of children: Not on file   Years of education: Not on file   Highest education level: Not on file  Occupational History   Occupation: Pensions consultant: RETIRED    Comment: Full time  Tobacco Use   Smoking status: Former    Years: 34.00    Types: Cigarettes    Quit date: 01/02/1994    Years since quitting: 27.4   Smokeless tobacco: Never  Vaping Use   Vaping Use: Never used  Substance and Sexual Activity   Alcohol use: No   Drug use: No   Sexual activity: Not on file  Other Topics Concern   Not on file  Social History  Narrative   Lives with spouse in [place]   Social Determinants of Health   Financial Resource Strain: Low Risk    Difficulty of Paying Living Expenses: Not hard at all  Food Insecurity: No Food Insecurity   Worried About Charity fundraiser in the Last Year: Never true   Arboriculturist in the Last Year: Never true  Transportation Needs: No Transportation Needs   Lack of Transportation (Medical): No   Lack of Transportation (Non-Medical): No  Physical Activity: Inactive   Days of Exercise per Week: 0 days   Minutes of Exercise per Session: 0 min  Stress: No Stress Concern Present   Feeling of Stress : Not at all  Social Connections: Moderately Integrated   Frequency of Communication with Friends and Family: Three times a week   Frequency of Social Gatherings with Friends and Family: Once a week   Attends Religious Services: More than 4 times per year   Active Member of [employer] or Organizations: No   Attends Archivist Meetings: Never   Marital Status: Married  Human resources officer Violence: Not At Risk   Fear of Current or Ex-Partner: No   Emotionally Abused: No   Physically Abused: No   Sexually Abused: No     Review of Systems  All other systems reviewed and are negative.     Objective:   Physical Exam Vitals reviewed.  Constitutional:      General: She is not in acute  distress.    Appearance: Normal appearance. She is not ill-appearing or toxic-appearing.  Eyes:     Extraocular Movements: Extraocular movements intact.     Conjunctiva/sclera: Conjunctivae normal.     Pupils: Pupils are equal, round, and reactive to light.  Cardiovascular:     Rate and Rhythm: Normal rate and regular rhythm.     Pulses: Normal pulses.     Heart sounds: Normal heart sounds. No murmur heard.   No friction rub. No gallop.  Pulmonary:     Effort: Pulmonary effort is normal. No respiratory distress.     Breath sounds: Normal breath sounds. No stridor. No wheezing, rhonchi or rales.  Abdominal:     General: Abdomen is flat. Bowel sounds are normal. There is no distension.     Palpations: Abdomen is soft.     Tenderness: There is no abdominal tenderness. There is no guarding.  Musculoskeletal:     Cervical back: Neck supple.     Right lower leg: No edema.     Left lower leg: No edema.  Skin:    General: Skin is warm.     Coloration: Skin is not jaundiced.     Findings: No bruising, erythema, lesion or rash.  Neurological:     General: No focal deficit present.     Mental Status: She is alert and oriented to person, place, and time.     Cranial Nerves: No cranial nerve deficit.     Sensory: No sensory deficit.     Motor: No weakness.     Coordination: Coordination normal.     Gait: Gait normal.     Deep Tendon Reflexes: Reflexes normal.  Psychiatric:        Mood and Affect: Mood normal.        Thought Content: Thought content normal.          Assessment & Plan:  Nausea - Plan: CBC with Differential/Platelet, COMPLETE METABOLIC PANEL WITH GFR, Lipase Exam shows no evidence of an acute  abdomen.  Differential diagnosis includes gastroenteritis from a stomach virus, pancreatitis, gastritis, biliary dyskinesia, gastroparesis, or IBS.  Given her previous history I favor IBS.  I will check a CBC CMP and a lipase to evaluate for any abnormalities in her liver function  test and or evidence of pancreatitis.  I have asked the patient to stop Phenergan which she takes once a night due to her history of chronic nausea and replace it with Zofran 4 mg every 8 hours as needed.  We will reassess next week.  If not improved,, I would try the patient on Reglan.  Await the results of the patient's lab work and response to Zofran

## 2021-06-02 LAB — CBC WITH DIFFERENTIAL/PLATELET
Absolute Monocytes: 932 cells/uL (ref 200–950)
Basophils Absolute: 50 cells/uL (ref 0–200)
Basophils Relative: 0.6 %
Eosinophils Absolute: 92 cells/uL (ref 15–500)
Eosinophils Relative: 1.1 %
HCT: 39.6 % (ref 35.0–45.0)
Hemoglobin: 13.9 g/dL (ref 11.7–15.5)
Lymphs Abs: 2906 cells/uL (ref 850–3900)
MCH: 30.5 pg (ref 27.0–33.0)
MCHC: 35.1 g/dL (ref 32.0–36.0)
MCV: 87 fL (ref 80.0–100.0)
MPV: 9.9 fL (ref 7.5–12.5)
Monocytes Relative: 11.1 %
Neutro Abs: 4418 cells/uL (ref 1500–7800)
Neutrophils Relative %: 52.6 %
Platelets: 239 10*3/uL (ref 140–400)
RBC: 4.55 10*6/uL (ref 3.80–5.10)
RDW: 12.6 % (ref 11.0–15.0)
Total Lymphocyte: 34.6 %
WBC: 8.4 10*3/uL (ref 3.8–10.8)

## 2021-06-02 LAB — COMPLETE METABOLIC PANEL WITH GFR
AG Ratio: 2.1 (calc) (ref 1.0–2.5)
ALT: 15 U/L (ref 6–29)
AST: 13 U/L (ref 10–35)
Albumin: 4.2 g/dL (ref 3.6–5.1)
Alkaline phosphatase (APISO): 69 U/L (ref 37–153)
BUN: 13 mg/dL (ref 7–25)
CO2: 26 mmol/L (ref 20–32)
Calcium: 9.9 mg/dL (ref 8.6–10.4)
Chloride: 93 mmol/L — ABNORMAL LOW (ref 98–110)
Creat: 0.71 mg/dL (ref 0.60–1.00)
Globulin: 2 g/dL (calc) (ref 1.9–3.7)
Glucose, Bld: 108 mg/dL — ABNORMAL HIGH (ref 65–99)
Potassium: 4.2 mmol/L (ref 3.5–5.3)
Sodium: 128 mmol/L — ABNORMAL LOW (ref 135–146)
Total Bilirubin: 0.4 mg/dL (ref 0.2–1.2)
Total Protein: 6.2 g/dL (ref 6.1–8.1)
eGFR: 88 mL/min/{1.73_m2} (ref >=60)

## 2021-06-02 LAB — LIPASE: Lipase: 53 U/L (ref 7–60)

## 2021-06-07 ENCOUNTER — Other Ambulatory Visit: Payer: Self-pay

## 2021-06-07 ENCOUNTER — Other Ambulatory Visit: Payer: PPO

## 2021-06-07 DIAGNOSIS — E871 Hypo-osmolality and hyponatremia: Secondary | ICD-10-CM

## 2021-06-07 LAB — BASIC METABOLIC PANEL
BUN: 12 mg/dL (ref 7–25)
CO2: 28 mmol/L (ref 20–32)
Calcium: 9.7 mg/dL (ref 8.6–10.4)
Chloride: 97 mmol/L — ABNORMAL LOW (ref 98–110)
Creat: 0.62 mg/dL (ref 0.60–1.00)
Glucose, Bld: 94 mg/dL (ref 65–99)
Potassium: 4.5 mmol/L (ref 3.5–5.3)
Sodium: 132 mmol/L — ABNORMAL LOW (ref 135–146)

## 2021-06-14 ENCOUNTER — Other Ambulatory Visit: Payer: Self-pay | Admitting: Family Medicine

## 2021-06-15 ENCOUNTER — Other Ambulatory Visit: Payer: Self-pay | Admitting: Family Medicine

## 2021-06-15 ENCOUNTER — Telehealth: Payer: Self-pay | Admitting: Family Medicine

## 2021-06-15 MED ORDER — HYDROCODONE-ACETAMINOPHEN 10-325 MG PO TABS: 1.0000 | ORAL_TABLET | Freq: Four times a day (QID) | ORAL | 0 refills | Status: DC | PRN

## 2021-06-15 NOTE — Telephone Encounter (Signed)
Patient called to request refill of HYDROcodone-acetaminophen ([place]) 10-325 MG tablet [[id number]]   Pharmacy confirmed as  [facility], [address]. [place], [place] [address]  Phone:  [phone]  Fax:  [phone]  DEA #:  [id number]  Please advise at [phone].

## 2021-06-29 ENCOUNTER — Encounter: Payer: Self-pay | Admitting: Internal Medicine

## 2021-06-29 ENCOUNTER — Ambulatory Visit: Payer: PPO | Admitting: Internal Medicine

## 2021-06-29 ENCOUNTER — Other Ambulatory Visit: Payer: Self-pay

## 2021-06-29 VITALS — BP 120/70 | HR 59 | Ht 65.0 in | Wt 156.0 lb

## 2021-06-29 DIAGNOSIS — I480 Paroxysmal atrial fibrillation: Secondary | ICD-10-CM

## 2021-06-29 MED ORDER — METOPROLOL SUCCINATE ER 25 MG PO TB24: 12.5000 mg | ORAL_TABLET | Freq: Every day | ORAL | 3 refills | Status: DC

## 2021-06-29 MED ORDER — AMLODIPINE BESYLATE 5 MG PO TABS: 2.5000 mg | ORAL_TABLET | Freq: Every day | ORAL | 3 refills | Status: DC

## 2021-06-29 NOTE — Progress Notes (Signed)
Patient Care Team: [name], MD as PCP - General (Family Medicine) Thompson Grayer, MD as PCP - Cardiology (Cardiology) Michael Boston, MD as Consulting Physician (General Surgery) Thompson Grayer, MD as Consulting Physician (Cardiology) Aviva Signs, MD as Consulting Physician (Gastroenterology) Gala Romney Cristopher Estimable, MD as Consulting Physician (Gastroenterology) Edythe Clarity, [facility] as Pharmacist (Pharmacist)   HPI  Erika Lee is a 75 y.o. female patient of Dr. Jackalyn Lombard followed by him for some years for atrial fibrillation and flutter which first presented 2008 and for which she underwent CTI and PVI 2015 in the context of diabetes hypertension and a vasospastic non-STEMI 7/20.  Loop recorder implanted 1/16 and replaced 4/19   DATE TEST EF   7/20 Echo  50-55 %   7/20 Cath  >65 % No obstructive coronary disease         Date Cr K Na Hgb  10/22 0.68 4.0  13.6  12/22 0.71 >> 0.62 4.2 >> 4.5 132<,128  13.9    She reports that she was given a cryo nerve block during left shoulder surgery (2020). This numbed several nerves to her heart. Her surgery was on a Thursday, and her heart attack occurred on the following Saturday morning. Since then there have been no recurrent cardiovascular events.  At home, her blood pressure has been as high as 140, but is mostly 130s/80s on average.  She only has mild shortness of breath when walking uphill, but this is baseline for her within the past few years.  She endorses intermittent LE edema that tends to last 2-3 days before resolving spontaneously. Last week her HCTZ was discontinued due to low sodium levels. At recheck her sodium improved, but she was instructed to remain off of HCTZ until her follow-up with her PCP in January.   The patient denies chest pain, nocturnal dyspnea, orthopnea or peripheral edema.  There have been no lightheadedness or syncope.  Thromboembolic risk factors ( age -98, HTN-1, DM-1 , Gender-1) for a  CHADSVASc Score of >=5   Records and Results Reviewed   Past Medical History:  Diagnosis Date   Anticoagulant long-term use    eliquis   Chronic sinusitis    GERD (gastroesophageal reflux disease)    Hemorrhoids    History of colitis 10/2016   campylobacter   Hyperlipidemia    Hypertension    Multinodular goiter    per pt has had 2 biopsy's both benign   NSTEMI (non-ST elevated myocardial infarction) ([place])    12/2018- normal coronary arteries on cath, poss coronary vasospasm.    Osteoarthritis    "all over"  and [facility] joint left shoulder   Paroxysmal atrial fibrillation [facility]) EP cardiologist--  [name]   s/p PVI by [name] 07/16/2013;   pt first dx 2008,  recurrence 2014 afib/flutter and tachy-brady   Rotator cuff tear, left    Shoulder impingement, left    Status post placement of implantable loop recorder    original placement 07-26-2014;  removal / replacement 10-17-2017  by dr allred   Type 2 diabetes mellitus ([place])    followed by pcp    Past Surgical History:  Procedure Laterality Date   ATRIAL FIBRILLATION ABLATION N/A 07/16/2013   PVI and CTI ablation by [name]   CARDIAC CATHETERIZATION     01/11/2019- Normal coronary arteries.    COLONOSCOPY  09/10/2011   Dr. Arnoldo Morale: normal, 10 year follow up.   implantable loop recorder removal  04/25/2021   MDT  LINQ removed by [name]   LEFT HEART CATH AND CORONARY ANGIOGRAPHY N/A 01/11/2019   Procedure: LEFT HEART CATH AND CORONARY ANGIOGRAPHY;  Surgeon: Lorretta Harp, MD;  Location: [place] [facility];  Service: Cardiovascular;  Laterality: N/A;   LESION REMOVAL N/A 05/03/2013   Procedure: EXCISION CYST, BACK;  Surgeon: Jamesetta So, MD;  Location: [facility];  Service: General;  Laterality: N/A;   LOOP RECORDER IMPLANT N/A 07/26/2014   Procedure: LOOP RECORDER IMPLANT;  Surgeon: Thompson Grayer, MD;  Location: [facility] CATH LAB;  Service: Cardiovascular;  Laterality: N/A;   LOOP RECORDER INSERTION N/A 10/17/2017   MDT  previously implanted ILR for afib management at [facility].  old device removed and new device placed by [name]   LOOP RECORDER REMOVAL N/A 10/17/2017   MDT ILR removed with new device subsequently replaced   SHOULDER ARTHROSCOPY WITH ROTATOR CUFF REPAIR AND SUBACROMIAL DECOMPRESSION Left 01/07/2019   Procedure: Left shoulder subacromial decompression, distal clavicle resection, extensive debridement,;  Surgeon: Nicholes Stairs, MD;  Location: [facility];  Service: Orthopedics;  Laterality: Left;   TEE WITHOUT CARDIOVERSION N/A 07/15/2013   Procedure: TRANSESOPHAGEAL ECHOCARDIOGRAM (TEE);  Surgeon: Lelon Perla, MD;  Location: [facility] ENDOSCOPY;  Service: Cardiovascular;  Laterality: N/A;   TONSILLECTOMY  age 63   TRANSANAL HEMORRHOIDAL DEARTERIALIZATION N/A 09/22/2015   Procedure: TRANSANAL HEMORRHOIDAL LIGATION/PEXY EUA POSSIBLE HEMORRHOIDECTOMY ;  Surgeon: Michael Boston, MD;  Location[facility];  Service: General;  Laterality: N/A;    Current Meds  Medication Sig   amLODipine (NORVASC) 5 MG tablet TAKE 1 TABLET(5 MG) BY MOUTH DAILY   apixaban (ELIQUIS) 5 MG TABS tablet Take 1 tablet (5 mg total) by mouth 2 (two) times daily.   azelastine (ASTELIN) 0.1 % nasal spray Place 2 sprays into both nostrils 2 (two) times daily. Use in each nostril as directed   diclofenac Sodium (VOLTAREN) 1 % GEL Apply 2 g topically 4 (four) times daily.   esomeprazole (NEXIUM) 20 MG capsule Take 20 mg by mouth daily.    Glucose Blood (BLOOD GLUCOSE TEST STRIPS) STRP Use as directed to monitor FSBS 1x daily. Dx: E11.9. please dispense as Freestyle Precision Neo   hydrochlorothiazide (HYDRODIURIL) 25 MG tablet TAKE 1 TABLET BY MOUTH EVERY MORNING FOR FLUID RETENTION   HYDROcodone-acetaminophen (NORCO) 10-325 MG tablet Take 1 tablet by mouth every 6 (six) hours as needed.   Lancets (FREESTYLE) lancets USE TO CHECK BLOOD SUGAR DAILY   lisinopril (ZESTRIL) 40 MG tablet Take 1 tablet (40 mg total) by mouth  daily.   metFORMIN (GLUCOPHAGE) 500 MG tablet TAKE 1 TABLET(500 MG) BY MOUTH DAILY   nitroGLYCERIN (NITROSTAT) 0.4 MG SL tablet Place 1 tablet (0.4 mg total) under the tongue every 5 (five) minutes as needed for chest pain (up to 3 tablets, if you are taking the 3rd tablet call 911).   ondansetron (ZOFRAN) 4 MG tablet Take 1 tablet (4 mg total) by mouth every 8 (eight) hours as needed for nausea or vomiting.   Potassium Gluconate 550 MG TABS Take 550 mg by mouth at bedtime.    promethazine (PHENERGAN) 12.5 MG tablet Take 1 tablet (12.5 mg total) by mouth every 6 (six) hours as needed for nausea or vomiting.   simvastatin (ZOCOR) 40 MG tablet TAKE 1 TABLET(40 MG) BY MOUTH AT BEDTIME    Allergies  Allergen Reactions   Clindamycin/Lincomycin Rash   Doxycycline Other (See Comments)    Makes heart race   Keflex [Cephalexin]  Nausea And Vomiting   Other    Sulfonic Acid (3,5-Dibromo-4-H-Ox-Benz)    Adhesive [Tape] Rash   Latex Rash   Sulfonamide Derivatives Nausea And Vomiting    Review of Systems negative except from HPI and PMH  Physical Exam BP 120/70    Pulse (!) 59    Ht 5\' 5"  (1.651 m)    Wt 156 lb (70.8 kg)    LMP  (LMP Unknown)    SpO2 97%    BMI 25.96 kg/m  Well developed and well nourished in no acute distress HENT normal E scleral and icterus clear Neck Supple JVP flat; carotids brisk and full Clear to ausculation Regular rate and rhythm, no murmurs gallops or rub Soft with active bowel sounds No clubbing cyanosis 1+  Edema Alert and oriented, grossly normal motor and sensory function Skin Warm and Dry  ECG sinus @ 59 18/10/42  CrCl cannot be calculated (Patient's most recent lab result is older than the maximum 21 days allowed.). Device interrogation 8/22 false positive A. fib PVCs but with discernible P wave  Assessment and  Plan  Atrial fibrillation/flutter  Hypertension  Vasospastic non-STEMI  Implantable loop recorder  Hyponatremia  Peripheral  edema   Blood pressure is well controlled.  We will decrease the amlodipine from 5--2.5 because it may be contributing to the edema.  We will also have her resume her hydrochlorothiazide at 25 mg but only every other day between now when she sees [name] in January.  No bleeding.  Continue her on the Eliquis at 5 mg twice daily.  Discussion regarding her vasospastic non-STEMI  Current medicines are reviewed at length with the patient today .  The patient does not  have concerns regarding medicines.   I,Mathew Stumpf,acting as a scribe for Virl Axe, MD.,have documented all relevant documentation on the behalf of Virl Axe, MD,as directed by  Virl Axe, MD while in the presence of Virl Axe, MD.  I, Virl Axe, MD, have reviewed all documentation for this visit. The documentation on 06/29/21 for the exam, diagnosis, procedures, and orders are all accurate and complete.

## 2021-06-29 NOTE — Patient Instructions (Signed)
Medication Instructions:  Your physician has recommended you make the following change in your medication:   ** Take Amlodipine 5mg  - 1/2 tablet (2.5mg ) by mouth daily  ** Resume HCTZ 25mg  - 1 tablet by mouth daily  ** Take Metoprolol Succinate 25mg  - 1 tablet by mouth daily  *If you need a refill on your cardiac medications before your next appointment, please call your pharmacy*   Lab Work: None ordered.  If you have labs (blood work) drawn today and your tests are completely normal, you will receive your results only by: [place] (if you have MyChart) OR A paper copy in the mail If you have any lab test that is abnormal or we need to change your treatment, we will call you to review the results.   Testing/Procedures: None ordered.    Follow-Up: At [facility], you and your health needs are our priority.  As part of our continuing mission to provide you with exceptional heart care, we have created designated Provider Care Teams.  These Care Teams include your primary Cardiologist (physician) and Advanced Practice Providers (APPs -  Physician Assistants and Nurse Practitioners) who all work together to provide you with the care you need, when you need it.  We recommend signing up for the patient portal called "MyChart".  Sign up information is provided on this After Visit Summary.  MyChart is used to connect with patients for Virtual Visits (Telemedicine).  Patients are able to view lab/test results, encounter notes, upcoming appointments, etc.  Non-urgent messages can be sent to your provider as well.   To learn more about what you can do with MyChart, go to [email or web address].    Your next appointment:   6 month(s)  The format for your next appointment:   In Person  Provider:   Virl Axe, MD{

## 2021-07-12 ENCOUNTER — Other Ambulatory Visit: Payer: Self-pay | Admitting: Family Medicine

## 2021-07-23 ENCOUNTER — Other Ambulatory Visit: Payer: Self-pay

## 2021-07-23 ENCOUNTER — Emergency Department (HOSPITAL_COMMUNITY)
Admission: EM | Admit: 2021-07-23 | Discharge: 2021-07-23 | Disposition: A | Discharge status: Home / Self Care | Payer: PPO | Attending: Emergency Medicine | Admitting: Emergency Medicine

## 2021-07-23 ENCOUNTER — Emergency Department (HOSPITAL_COMMUNITY): Payer: PPO

## 2021-07-23 DIAGNOSIS — Z7984 Long term (current) use of oral hypoglycemic drugs: Secondary | ICD-10-CM | POA: Diagnosis not present

## 2021-07-23 DIAGNOSIS — Z91040 Latex allergy status: Secondary | ICD-10-CM | POA: Diagnosis not present

## 2021-07-23 DIAGNOSIS — Z79899 Other long term (current) drug therapy: Secondary | ICD-10-CM | POA: Insufficient documentation

## 2021-07-23 DIAGNOSIS — I2510 Atherosclerotic heart disease of native coronary artery without angina pectoris: Secondary | ICD-10-CM | POA: Diagnosis not present

## 2021-07-23 DIAGNOSIS — R079 Chest pain, unspecified: Secondary | ICD-10-CM | POA: Diagnosis not present

## 2021-07-23 DIAGNOSIS — R072 Precordial pain: Secondary | ICD-10-CM | POA: Diagnosis not present

## 2021-07-23 DIAGNOSIS — I10 Essential (primary) hypertension: Secondary | ICD-10-CM | POA: Insufficient documentation

## 2021-07-23 DIAGNOSIS — I517 Cardiomegaly: Secondary | ICD-10-CM | POA: Diagnosis not present

## 2021-07-23 DIAGNOSIS — I4891 Unspecified atrial fibrillation: Secondary | ICD-10-CM | POA: Diagnosis not present

## 2021-07-23 DIAGNOSIS — R0789 Other chest pain: Secondary | ICD-10-CM | POA: Diagnosis present

## 2021-07-23 DIAGNOSIS — E871 Hypo-osmolality and hyponatremia: Secondary | ICD-10-CM | POA: Diagnosis not present

## 2021-07-23 DIAGNOSIS — Z7901 Long term (current) use of anticoagulants: Secondary | ICD-10-CM | POA: Diagnosis not present

## 2021-07-23 DIAGNOSIS — E119 Type 2 diabetes mellitus without complications: Secondary | ICD-10-CM | POA: Insufficient documentation

## 2021-07-23 LAB — CBC
HCT: 41.3 % (ref 36.0–46.0)
Hemoglobin: 14 g/dL (ref 12.0–15.0)
MCH: 30.3 pg (ref 26.0–34.0)
MCHC: 33.9 g/dL (ref 30.0–36.0)
MCV: 89.4 fL (ref 80.0–100.0)
Platelets: 196 10*3/uL (ref 150–400)
RBC: 4.62 MIL/uL (ref 3.87–5.11)
RDW: 12.4 % (ref 11.5–15.5)
WBC: 7.4 10*3/uL (ref 4.0–10.5)
nRBC: 0 % (ref 0.0–0.2)

## 2021-07-23 LAB — TROPONIN I (HIGH SENSITIVITY)
Troponin I (High Sensitivity): 8 ng/L (ref <18)
Troponin I (High Sensitivity): 9 ng/L (ref <18)

## 2021-07-23 LAB — BASIC METABOLIC PANEL
Anion gap: 6 (ref 5–15)
BUN: 9 mg/dL (ref 8–23)
CO2: 25 mmol/L (ref 22–32)
Calcium: 9.4 mg/dL (ref 8.9–10.3)
Chloride: 103 mmol/L (ref 98–111)
Creatinine, Ser: 0.59 mg/dL (ref 0.44–1.00)
GFR, Estimated: >60 mL/min (ref >60)
Glucose, Bld: 128 mg/dL — ABNORMAL HIGH (ref 70–99)
Potassium: 3.5 mmol/L (ref 3.5–5.1)
Sodium: 134 mmol/L — ABNORMAL LOW (ref 135–145)

## 2021-07-23 MED ORDER — SODIUM CHLORIDE 0.9 % IV BOLUS: 1000.0000 mL | Freq: Once | INTRAVENOUS | Status: DC

## 2021-07-23 NOTE — ED Notes (Signed)
Pt upset because she has not been seen by the provider yet. Explained to patient that the providers are making round they will be in as soon as they can.

## 2021-07-23 NOTE — ED Triage Notes (Signed)
Patient states CP started today and has now eased off due to 324 aspirin with one dose of 0.4mg  nitro. Patient alert & oriented X3 with hx of A-fib.

## 2021-07-23 NOTE — ED Provider Notes (Addendum)
[facility] EMERGENCY DEPARTMENT Provider Note   CSN: [id number] Arrival date & time: 07/23/21  1557     History  Chief Complaint  Patient presents with   Chest Pain    Erika Lee is a 76 y.o. female.  Patient brought in by EMS.  Patient with the onset of substernal chest pain that tended to radiate to the back at 2 PM.  Completely resolved by 5 PM.  Patient was given 324 mg of aspirin and 1 dose of nitroglycerin by EMS.  Patient states that it was more of a pressure not a pain.  Not associated with shortness of breath not associated with nausea.  Patient has a history of atrial fibrillation.  Patient has a history of gastroesophageal reflux disease.  Patient has a history of diabetes.  Patient has a history of hypertension and history of non-ST segment elevated MI in 2020. But had normal cardiac coronary artery arteries on cath possible coronary vasospasm.      Home Medications Prior to Admission medications   Medication Sig Start Date End Date Taking? Authorizing Provider  amLODipine (NORVASC) 5 MG tablet Take 0.5 tablets (2.5 mg total) by mouth daily. 06/29/21  Yes Deboraha Sprang, MD  apixaban (ELIQUIS) 5 MG TABS tablet Take 1 tablet (5 mg total) by mouth 2 (two) times daily. 02/26/21  Yes [name], MD  diclofenac Sodium (VOLTAREN) 1 % GEL Apply 2 g topically 4 (four) times daily. 01/22/21  Yes [name], MD  esomeprazole (NEXIUM) 20 MG capsule Take 20 mg by mouth daily.    Yes [provider]  hydrochlorothiazide (HYDRODIURIL) 25 MG tablet TAKE 1 TABLET BY MOUTH EVERY MORNING FOR FLUID RETENTION Patient taking differently: Take 25 mg by mouth daily. 06/14/21  Yes [name], MD  HYDROcodone-acetaminophen (NORCO) 10-325 MG tablet Take 1 tablet by mouth every 6 (six) hours as needed. 06/15/21  Yes [name], MD  lisinopril (ZESTRIL) 40 MG tablet TAKE 1 TABLET(40 MG) BY MOUTH DAILY Patient taking differently: Take 40 mg by mouth daily.  07/12/21  Yes [name], MD  metFORMIN (GLUCOPHAGE) 500 MG tablet TAKE 1 TABLET(500 MG) BY MOUTH DAILY Patient taking differently: Take 500 mg by mouth daily. 04/30/21  Yes [name], MD  metoprolol succinate (TOPROL XL) 25 MG 24 hr tablet Take 0.5 tablets (12.5 mg total) by mouth daily. 06/29/21  Yes Deboraha Sprang, MD  nitroGLYCERIN (NITROSTAT) 0.4 MG SL tablet Place 1 tablet (0.4 mg total) under the tongue every 5 (five) minutes as needed for chest pain (up to 3 tablets, if you are taking the 3rd tablet call 911). 04/25/21  Yes Allred, Jeneen Rinks, MD  ondansetron (ZOFRAN) 4 MG tablet Take 1 tablet (4 mg total) by mouth every 8 (eight) hours as needed for nausea or vomiting. 06/01/21  Yes [name], MD  Potassium Gluconate 550 MG TABS Take 550 mg by mouth at bedtime.    Yes [provider]  promethazine (PHENERGAN) 12.5 MG tablet Take 1 tablet (12.5 mg total) by mouth every 6 (six) hours as needed for nausea or vomiting. 12/12/20  Yes [name], MD  simvastatin (ZOCOR) 40 MG tablet TAKE 1 TABLET(40 MG) BY MOUTH AT BEDTIME Patient taking differently: Take 40 mg by mouth at bedtime. 04/17/21  Yes [name], MD  azelastine (ASTELIN) 0.1 % nasal spray Place 2 sprays into both nostrils 2 (two) times daily. Use in each nostril as directed Patient not  taking: Reported on 07/23/2021 06/01/21   [name], MD  Glucose Blood (BLOOD GLUCOSE TEST STRIPS) STRP Use as directed to monitor FSBS 1x daily. Dx: E11.9. please dispense as Freestyle Precision Neo 07/24/20   [name], MD  Lancets (FREESTYLE) lancets USE TO CHECK BLOOD SUGAR DAILY 03/20/20   [name], MD      Allergies    Clindamycin/lincomycin; Doxycycline; Keflex [cephalexin]; Other; Sulfonic acid (3,5-dibromo-4-h-ox-benz); Adhesive [tape]; Latex; and Sulfonamide derivatives    Review of Systems   Review of Systems  Constitutional:  Negative for chills and fever.  HENT:  Negative for  ear pain and sore throat.   Eyes:  Negative for pain and visual disturbance.  Respiratory:  Negative for cough and shortness of breath.   Cardiovascular:  Positive for chest pain. Negative for palpitations.  Gastrointestinal:  Negative for abdominal pain and vomiting.  Genitourinary:  Negative for dysuria and hematuria.  Musculoskeletal:  Negative for arthralgias and back pain.  Skin:  Negative for color change and rash.  Neurological:  Negative for seizures and syncope.  All other systems reviewed and are negative.  Physical Exam Updated Vital Signs BP (!) 152/76    Pulse 68    Temp 97.7 F (36.5 C) (Oral)    Resp 16    Ht 1.651 m (5\' 5" )    Wt 68 kg    LMP  (LMP Unknown)    SpO2 96%    BMI 24.96 kg/m  Physical Exam Vitals and nursing note reviewed.  Constitutional:      General: She is not in acute distress.    Appearance: Normal appearance. She is well-developed.  HENT:     Head: Normocephalic and atraumatic.  Eyes:     Extraocular Movements: Extraocular movements intact.     Conjunctiva/sclera: Conjunctivae normal.     Pupils: Pupils are equal, round, and reactive to light.  Cardiovascular:     Rate and Rhythm: Normal rate and regular rhythm.     Heart sounds: No murmur heard. Pulmonary:     Effort: Pulmonary effort is normal. No respiratory distress.     Breath sounds: Normal breath sounds. No wheezing or rales.  Chest:     Chest wall: No tenderness.  Abdominal:     Palpations: Abdomen is soft.     Tenderness: There is no abdominal tenderness.  Musculoskeletal:        General: No swelling.     Cervical back: Normal range of motion and neck supple.  Skin:    General: Skin is warm and dry.     Capillary Refill: Capillary refill takes less than 2 seconds.  Neurological:     General: No focal deficit present.     Mental Status: She is alert and oriented to person, place, and time.     Cranial Nerves: No cranial nerve deficit.     Sensory: No sensory deficit.      Motor: No weakness.  Psychiatric:        Mood and Affect: Mood normal.    ED Results / Procedures / Treatments   Labs (all labs ordered are listed, but only abnormal results are displayed) Labs Reviewed  BASIC METABOLIC PANEL - Abnormal; Notable for the following components:      Result Value   Sodium 134 (*)    Glucose, Bld 128 (*)    All other components within normal limits  CBC  TROPONIN I (HIGH SENSITIVITY)  TROPONIN I (HIGH SENSITIVITY)  EKG EKG Interpretation  Date/Time:  Monday July 23 2021 18:31:07 EST Ventricular Rate:  64 PR Interval:  175 QRS Duration: 106 QT Interval:  446 QTC Calculation: 461 R Axis:   67 Text Interpretation: Sinus rhythm Confirmed by Fredia Sorrow [phone]) on 07/23/2021 6:38:50 PM  Radiology DG Chest 2 View  Result Date: 07/23/2021 CLINICAL DATA:  Chest pain EXAM: CHEST - 2 VIEW COMPARISON:  12/18/2020 FINDINGS: Heart is borderline in size. Lungs clear. No effusions or edema. No acute bony abnormality. IMPRESSION: Borderline cardiomegaly.  No active disease. Electronically Signed   By: Rolm Baptise M.D.   On: 07/23/2021 18:32    Procedures Procedures  Patient's cardiac monitor here sinus rhythm.  No evidence of any atrial fibrillation.  EKG showed sinus rhythm.  No significant changes from previously.  Medications Ordered in ED Medications - No data to display  ED Course/ Medical Decision Making/ A&P                           Medical Decision Making Amount and/or Complexity of Data Reviewed Labs: ordered. Radiology: ordered.   Patient currently chest pain-free.  Patient does have some significant cardiac risk factors.  Onset of chest pain at 1400 and resolved completely by 1700.  Patient's initial troponin very reassuring with a troponin of 8.  Delta troponins pending.  Chest x-ray had no acute process.  CBC is normal no leukocytosis basic metabolic panel only significant for blood sugar of 128.  Mild hyponatremia with a  sodium of 134 renal function normal.  Patient is remained chest pain-free since I have seen her.  Delta troponin is pending.  There is no significant change in the delta troponin patient will be stable for discharge home and follow-up with cardiology.  Delta troponin was 9.  No significant change.  Patient stable for discharge home.   Final Clinical Impression(s) / ED Diagnoses Final diagnoses:  Precordial pain    Rx / DC Orders ED Discharge Orders     None         Fredia Sorrow, MD 07/23/21 2027    Fredia Sorrow, MD 07/23/21 2107

## 2021-07-23 NOTE — Discharge Instructions (Addendum)
Call cardiology to follow-up.  Return for any recurrent chest pain lasting 20 minutes or longer.  The cardiac enzymes here tonight were normal.

## 2021-07-26 ENCOUNTER — Ambulatory Visit (INDEPENDENT_AMBULATORY_CARE_PROVIDER_SITE_OTHER): Payer: PPO | Admitting: Family Medicine

## 2021-07-26 ENCOUNTER — Other Ambulatory Visit: Payer: Self-pay

## 2021-07-26 ENCOUNTER — Encounter: Payer: Self-pay | Admitting: Family Medicine

## 2021-07-26 VITALS — BP 138/68 | HR 70 | Temp 97.6°F | Resp 18 | Ht 65.0 in | Wt 156.0 lb

## 2021-07-26 DIAGNOSIS — R5383 Other fatigue: Secondary | ICD-10-CM

## 2021-07-26 DIAGNOSIS — I10 Essential (primary) hypertension: Secondary | ICD-10-CM | POA: Diagnosis not present

## 2021-07-26 DIAGNOSIS — E118 Type 2 diabetes mellitus with unspecified complications: Secondary | ICD-10-CM

## 2021-07-26 DIAGNOSIS — R0600 Dyspnea, unspecified: Secondary | ICD-10-CM

## 2021-07-26 DIAGNOSIS — Z79899 Other long term (current) drug therapy: Secondary | ICD-10-CM | POA: Diagnosis not present

## 2021-07-26 DIAGNOSIS — I480 Paroxysmal atrial fibrillation: Secondary | ICD-10-CM

## 2021-07-26 NOTE — Progress Notes (Signed)
Subjective:    Patient ID: Erika Lee, female    DOB: [birth year], 76 y.o.   MRN: [id number]  Patient is a 76 year old Caucasian female with a history of paroxysmal atrial fibrillation currently in normal sinus rhythm.  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  However per her report catheterizations were clear from any coronary artery disease.  Last echocardiogram of the heart was in 2020 which did show akinesis but normal ejection fraction 50%.  She states that over the last 2-week she has been extremely tired and weak.  She reports shortness of breath and dyspnea on exertion.  She also reports tightness in her chest that radiates into her left shoulder.  She went to the emergency room where troponins were negative x2, chest x-ray was normal, EKG showed normal sinus rhythm with no abnormalities.  She was referred back here.  She has not had a stress test in quite some time.  She does have pitting edema on extremities today.  Her lungs however are clear to auscultation bilaterally   Past Medical History:  Diagnosis Date   Anticoagulant long-term use    eliquis   Chronic sinusitis    GERD (gastroesophageal reflux disease)    Hemorrhoids    History of colitis 10/2016   campylobacter   Hyperlipidemia    Hypertension    Multinodular goiter    per pt has had 2 biopsy's both benign   NSTEMI (non-ST elevated myocardial infarction) ([place])    12/2018- normal coronary arteries on cath, poss coronary vasospasm.    Osteoarthritis    "all over"  and [facility] joint left shoulder   Paroxysmal atrial fibrillation [facility]) EP cardiologist--  [name]   s/p PVI by [name] 07/16/2013;   pt first dx 2008,  recurrence 2014 afib/flutter and tachy-brady   Rotator cuff tear, left    Shoulder impingement, left    Status post placement of implantable loop recorder    original placement 07-26-2014;  removal / replacement 10-17-2017  by dr allred   Type 2 diabetes mellitus ([place])     followed by pcp   Past Surgical History:  Procedure Laterality Date   ATRIAL FIBRILLATION ABLATION N/A 07/16/2013   PVI and CTI ablation by [name]   CARDIAC CATHETERIZATION     01/11/2019- Normal coronary arteries.    COLONOSCOPY  09/10/2011   Dr. Arnoldo Morale: normal, 10 year follow up.   implantable loop recorder removal  04/25/2021   MDT LINQ removed by [name]   LEFT HEART CATH AND CORONARY ANGIOGRAPHY N/A 01/11/2019   Procedure: LEFT HEART CATH AND CORONARY ANGIOGRAPHY;  Surgeon: Lorretta Harp, MD;  Location: [facility];  Service: Cardiovascular;  Laterality: N/A;   LESION REMOVAL N/A 05/03/2013   Procedure: EXCISION CYST, BACK;  Surgeon: Jamesetta So, MD;  Location: [facility];  Service: General;  Laterality: N/A;   LOOP RECORDER IMPLANT N/A 07/26/2014   Procedure: LOOP RECORDER IMPLANT;  Surgeon: Thompson Grayer, MD;  Location: [facility] CATH LAB;  Service: Cardiovascular;  Laterality: N/A;   LOOP RECORDER INSERTION N/A 10/17/2017   MDT previously implanted ILR for afib management at [facility].  old device removed and new device placed by [name]   LOOP RECORDER REMOVAL N/A 10/17/2017   MDT ILR removed with new device subsequently replaced   SHOULDER ARTHROSCOPY WITH ROTATOR CUFF REPAIR AND SUBACROMIAL DECOMPRESSION Left 01/07/2019   Procedure: Left shoulder subacromial decompression, distal clavicle resection, extensive debridement,;  Surgeon: Nicholes Stairs, MD;  Location: [facility];  Service: Orthopedics;  Laterality: Left;   TEE WITHOUT CARDIOVERSION N/A 07/15/2013   Procedure: TRANSESOPHAGEAL ECHOCARDIOGRAM (TEE);  Surgeon: Lelon Perla, MD;  Location: [facility] ENDOSCOPY;  Service: Cardiovascular;  Laterality: N/A;   TONSILLECTOMY  age 1   TRANSANAL HEMORRHOIDAL DEARTERIALIZATION N/A 09/22/2015   Procedure: TRANSANAL HEMORRHOIDAL LIGATION/PEXY EUA POSSIBLE HEMORRHOIDECTOMY ;  Surgeon: Michael Boston, MD;  Location: [facility];  Service: General;  Laterality: N/A;    Current Outpatient Medications on File Prior to Visit  Medication Sig Dispense Refill   amLODipine (NORVASC) 5 MG tablet Take 0.5 tablets (2.5 mg total) by mouth daily. 45 tablet 3   apixaban (ELIQUIS) 5 MG TABS tablet Take 1 tablet (5 mg total) by mouth 2 (two) times daily. 60 tablet 3   azelastine (ASTELIN) 0.1 % nasal spray Place 2 sprays into both nostrils 2 (two) times daily. Use in each nostril as directed (Patient not taking: Reported on 07/23/2021) 30 mL 12   diclofenac Sodium (VOLTAREN) 1 % GEL Apply 2 g topically 4 (four) times daily. 100 g 2   esomeprazole (NEXIUM) 20 MG capsule Take 20 mg by mouth daily.      Glucose Blood (BLOOD GLUCOSE TEST STRIPS) STRP Use as directed to monitor FSBS 1x daily. Dx: E11.9. please dispense as Freestyle Precision Neo 50 strip 11   hydrochlorothiazide (HYDRODIURIL) 25 MG tablet TAKE 1 TABLET BY MOUTH EVERY MORNING FOR FLUID RETENTION (Patient taking differently: Take 25 mg by mouth daily.) 90 tablet 0   HYDROcodone-acetaminophen (NORCO) 10-325 MG tablet Take 1 tablet by mouth every 6 (six) hours as needed. 120 tablet 0   Lancets (FREESTYLE) lancets USE TO CHECK BLOOD SUGAR DAILY 100 each 2   lisinopril (ZESTRIL) 40 MG tablet TAKE 1 TABLET(40 MG) BY MOUTH DAILY (Patient taking differently: Take 40 mg by mouth daily.) 90 tablet 3   metFORMIN (GLUCOPHAGE) 500 MG tablet TAKE 1 TABLET(500 MG) BY MOUTH DAILY (Patient taking differently: Take 500 mg by mouth daily.) 90 tablet 3   metoprolol succinate (TOPROL XL) 25 MG 24 hr tablet Take 0.5 tablets (12.5 mg total) by mouth daily. 45 tablet 3   nitroGLYCERIN (NITROSTAT) 0.4 MG SL tablet Place 1 tablet (0.4 mg total) under the tongue every 5 (five) minutes as needed for chest pain (up to 3 tablets, if you are taking the 3rd tablet call 911). 25 tablet 3   ondansetron (ZOFRAN) 4 MG tablet Take 1 tablet (4 mg total) by mouth every 8 (eight) hours as needed for nausea or vomiting. 20 tablet 0   Potassium Gluconate  550 MG TABS Take 550 mg by mouth at bedtime.      promethazine (PHENERGAN) 12.5 MG tablet Take 1 tablet (12.5 mg total) by mouth every 6 (six) hours as needed for nausea or vomiting. 30 tablet 3   simvastatin (ZOCOR) 40 MG tablet TAKE 1 TABLET(40 MG) BY MOUTH AT BEDTIME (Patient taking differently: Take 40 mg by mouth at bedtime.) 90 tablet 1   No current facility-administered medications on file prior to visit.   Allergies  Allergen Reactions   Clindamycin/Lincomycin Rash   Doxycycline Other (See Comments)    Makes heart race   Keflex [Cephalexin] Nausea And Vomiting   Other    Sulfonic Acid (3,5-Dibromo-4-H-Ox-Benz)    Adhesive [Tape] Rash   Latex Rash   Sulfonamide Derivatives Nausea And Vomiting   Social History   Socioeconomic History   Marital status: Married  Spouse name: Not on file   Number of children: Not on file   Years of education: Not on file   Highest education level: Not on file  Occupational History   Occupation: Pensions consultant: RETIRED    Comment: Full time  Tobacco Use   Smoking status: Former    Years: 34.00    Types: Cigarettes    Quit date: 01/02/1994    Years since quitting: 27.5   Smokeless tobacco: Never  Vaping Use   Vaping Use: Never used  Substance and Sexual Activity   Alcohol use: No   Drug use: No   Sexual activity: Not on file  Other Topics Concern   Not on file  Social History Narrative   Lives with spouse in [place] Resource Strain: Low Risk    Difficulty of Paying Living Expenses: Not hard at all  Food Insecurity: No Food Insecurity   Worried About Charity fundraiser in the Last Year: Never true   [place] in the Last Year: Never true  Transportation Needs: No Transportation Needs   Lack of Transportation (Medical): No   Lack of Transportation (Non-Medical): No  Physical Activity: Inactive   Days of Exercise per Week: 0 days   Minutes of Exercise per  Session: 0 min  Stress: No Stress Concern Present   Feeling of Stress : Not at all  Social Connections: Moderately Integrated   Frequency of Communication with Friends and Family: Three times a week   Frequency of Social Gatherings with Friends and Family: Once a week   Attends Religious Services: More than 4 times per year   Active Member of [employer] or Organizations: No   Attends Archivist Meetings: Never   Marital Status: Married  Human resources officer Violence: Not At Risk   Fear of Current or Ex-Partner: No   Emotionally Abused: No   Physically Abused: No   Sexually Abused: No     Review of Systems  All other systems reviewed and are negative.     Objective:   Physical Exam Vitals reviewed.  Constitutional:      General: She is not in acute distress.    Appearance: Normal appearance. She is not ill-appearing or toxic-appearing.  Eyes:     Extraocular Movements: Extraocular movements intact.     Conjunctiva/sclera: Conjunctivae normal.     Pupils: Pupils are equal, round, and reactive to light.  Cardiovascular:     Rate and Rhythm: Normal rate and regular rhythm.     Pulses: Normal pulses.     Heart sounds: Normal heart sounds. No murmur heard.   No friction rub. No gallop.  Pulmonary:     Effort: Pulmonary effort is normal. No respiratory distress.     Breath sounds: Normal breath sounds. No stridor. No wheezing, rhonchi or rales.  Abdominal:     General: Abdomen is flat. Bowel sounds are normal. There is no distension.     Palpations: Abdomen is soft.     Tenderness: There is no abdominal tenderness. There is no guarding.  Musculoskeletal:     Cervical back: Neck supple.     Lumbar back: Tenderness present. Decreased range of motion.     Right lower leg: No edema.     Left lower leg: No edema.  Skin:    General: Skin is warm.     Coloration: Skin is not jaundiced.     Findings:  No bruising, erythema, lesion or rash.  Neurological:     General: No focal  deficit present.     Mental Status: She is alert and oriented to person, place, and time.     Cranial Nerves: No cranial nerve deficit.     Sensory: No sensory deficit.     Motor: No weakness.     Coordination: Coordination normal.     Gait: Gait normal.     Deep Tendon Reflexes: Reflexes normal.  Psychiatric:        Mood and Affect: Mood normal.        Thought Content: Thought content normal.          Assessment & Plan:  Dyspnea, unspecified type - Plan: D-dimer, quantitative, Brain natriuretic peptide, BASIC METABOLIC PANEL WITH GFR  Controlled type 2 diabetes mellitus with complication, without long-term current use of insulin (HCC)  Paroxysmal atrial fibrillation (HCC)  Essential hypertension, benign  Fatigue, unspecified type - Plan: Vitamin B12, TSH Patient reports feeling tired and weak and shortness of breath with atypical chest pain.  She denies any angina.  My biggest concern is a cardiac source.  I will schedule patient for an echocardiogram soon as possible.  Check a BNP along with a D-dimer.  We will add Lasix if BNP is elevated.  If D-dimer is positive, will obtain CT scan of the chest.  Do not feel that the chest pressure is GI in origin.  We will consult cardiology for stress test.  Also check B12 and TSH given her fatigue.

## 2021-07-27 ENCOUNTER — Other Ambulatory Visit: Payer: Self-pay

## 2021-07-27 ENCOUNTER — Telehealth: Payer: Self-pay

## 2021-07-27 ENCOUNTER — Telehealth: Payer: Self-pay | Admitting: Family Medicine

## 2021-07-27 LAB — TEST AUTHORIZATION

## 2021-07-27 LAB — TSH: TSH: 2.3 mIU/L (ref 0.40–4.50)

## 2021-07-27 LAB — BASIC METABOLIC PANEL WITH GFR
BUN: 11 mg/dL (ref 7–25)
CO2: 29 mmol/L (ref 20–32)
Calcium: 9.8 mg/dL (ref 8.6–10.4)
Chloride: 100 mmol/L (ref 98–110)
Creat: 0.73 mg/dL (ref 0.60–1.00)
Glucose, Bld: 115 mg/dL — ABNORMAL HIGH (ref 65–99)
Potassium: 4.1 mmol/L (ref 3.5–5.3)
Sodium: 137 mmol/L (ref 135–146)
eGFR: 85 mL/min/{1.73_m2} (ref >=60)

## 2021-07-27 LAB — BRAIN NATRIURETIC PEPTIDE: Brain Natriuretic Peptide: 58 pg/mL (ref <100)

## 2021-07-27 LAB — VITAMIN B12: Vitamin B-12: 270 pg/mL (ref 200–1100)

## 2021-07-27 LAB — D-DIMER, QUANTITATIVE: D-Dimer, Quant: 0.43 mcg/mL FEU (ref <0.50)

## 2021-07-27 MED ORDER — HYDROCODONE-ACETAMINOPHEN 10-325 MG PO TABS: 1.0000 | ORAL_TABLET | Freq: Four times a day (QID) | ORAL | 0 refills | Status: DC | PRN

## 2021-07-27 NOTE — Telephone Encounter (Signed)
Pt came in to office requesting a refill of HYDROcodone-acetaminophen (NORCO) 10-325 MG tablet  Bc#: [phone]

## 2021-07-27 NOTE — Telephone Encounter (Signed)
Lab results have not been reviewed yet, some are not complete. Echocardiogram has been ordered and someone will contact pt to schedule.  [place] to advise

## 2021-07-27 NOTE — Telephone Encounter (Signed)
Patient called complaining of foot pain last night.  Advised her to take ibuprofen/tylenol and use heat/Ice.  She also asked about results, advised her they had not come back yet and we would contact her.

## 2021-07-27 NOTE — Telephone Encounter (Signed)
Pt aware of results.  Nothing further needed at this time.  

## 2021-07-27 NOTE — Telephone Encounter (Signed)
Hydrocodone last filled 06/15/2021.

## 2021-07-27 NOTE — Telephone Encounter (Signed)
Patient following up on conversation with provider yesterday about lab results and possible follow up with an ultrasound or other exam. Please advise at [phone].

## 2021-08-02 ENCOUNTER — Ambulatory Visit (HOSPITAL_COMMUNITY)
Admission: RE | Admit: 2021-08-02 | Discharge: 2021-08-02 | Disposition: A | Discharge status: Home / Self Care | Payer: PPO | Source: Ambulatory Visit | Attending: Family Medicine | Admitting: Family Medicine

## 2021-08-02 ENCOUNTER — Other Ambulatory Visit: Payer: Self-pay

## 2021-08-02 DIAGNOSIS — I480 Paroxysmal atrial fibrillation: Secondary | ICD-10-CM | POA: Diagnosis not present

## 2021-08-02 DIAGNOSIS — R0609 Other forms of dyspnea: Secondary | ICD-10-CM | POA: Diagnosis not present

## 2021-08-02 DIAGNOSIS — R0600 Dyspnea, unspecified: Secondary | ICD-10-CM

## 2021-08-02 DIAGNOSIS — R5383 Other fatigue: Secondary | ICD-10-CM

## 2021-08-02 LAB — ECHOCARDIOGRAM COMPLETE
Area-P 1/2: 2.56 cm2
S' Lateral: 2.9 cm

## 2021-08-07 ENCOUNTER — Encounter: Payer: Self-pay | Admitting: Family Medicine

## 2021-08-07 ENCOUNTER — Other Ambulatory Visit: Payer: Self-pay

## 2021-08-07 ENCOUNTER — Ambulatory Visit (INDEPENDENT_AMBULATORY_CARE_PROVIDER_SITE_OTHER): Payer: PPO | Admitting: Family Medicine

## 2021-08-07 VITALS — BP 128/72 | HR 48 | Temp 97.9°F | Resp 18 | Ht 65.0 in | Wt 156.0 lb

## 2021-08-07 DIAGNOSIS — R5382 Chronic fatigue, unspecified: Secondary | ICD-10-CM | POA: Diagnosis not present

## 2021-08-07 NOTE — Progress Notes (Signed)
Subjective:    Patient ID: Erika Lee, female    DOB: [birth year], 76 y.o.   MRN: [id number] 07/26/21 Patient is a 76 year old Caucasian female with a history of paroxysmal atrial fibrillation currently in normal sinus rhythm.  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  However per her report catheterizations were clear from any coronary artery disease.  Last echocardiogram of the heart was in 2020 which did show akinesis but normal ejection fraction 50%.  She states that over the last 2-week she has been extremely tired and weak.  She reports shortness of breath and dyspnea on exertion.  She also reports tightness in her chest that radiates into her left shoulder.  She went to the emergency room where troponins were negative x2, chest x-ray was normal, EKG showed normal sinus rhythm with no abnormalities.  She was referred back here.  She has not had a stress test in quite some time.  She does have pitting edema on extremities today.  Her lungs however are clear to auscultation bilaterally.  At that time, my plan was:  Patient reports feeling tired and weak and shortness of breath with atypical chest pain.  She denies any angina.  My biggest concern is a cardiac source.  I will schedule patient for an echocardiogram soon as possible.  Check a BNP along with a D-dimer.  We will add Lasix if BNP is elevated.  If D-dimer is positive, will obtain CT scan of the chest.  Do not feel that the chest pressure is GI in origin.  We will consult cardiology for stress test.  Also check B12 and TSH given her fatigue.  08/07/21 Lab work showed a normal CBC, normal BMP, normal BNP, normal D-dimer.  Vitamin B12 is borderline low at 270.  We ordered an echocardiogram.  Ejection fraction was 60 to 65%.  There was grade 1 diastolic dysfunction and left atrial enlargement but overall the echocardiogram is reassuring.  The patient is here today to discuss her lab work and echocardiogram.  She  states that she is feeling better.  She believes a lot of her fatigue could be caregiver today secondary to having to care for this been who is becoming increasingly dependent for her assistant performing ADLs.  She states that she used to have a husband but at times she feels like she has a baby that she has to care for due to his medical issues.  She denies any depression.  She denies any ongoing chest pain.  She denies any weight loss.  She denies any melena or hematochezia or cough or pleurisy   Past Medical History:  Diagnosis Date   Anticoagulant long-term use    eliquis   Chronic sinusitis    GERD (gastroesophageal reflux disease)    Hemorrhoids    History of colitis 10/2016   campylobacter   Hyperlipidemia    Hypertension    Multinodular goiter    per pt has had 2 biopsy's both benign   NSTEMI (non-ST elevated myocardial infarction) ([place])    12/2018- normal coronary arteries on cath, poss coronary vasospasm.    Osteoarthritis    "all over"  and [facility] joint left shoulder   Paroxysmal atrial fibrillation [facility]) EP cardiologist--  [name]   s/p PVI by [name] 07/16/2013;   pt first dx 2008,  recurrence 2014 afib/flutter and tachy-brady   Rotator cuff tear, left    Shoulder impingement, left    Status post  placement of implantable loop recorder    original placement 07-26-2014;  removal / replacement 10-17-2017  by dr allred   Type 2 diabetes mellitus ([place])    followed by pcp   Past Surgical History:  Procedure Laterality Date   ATRIAL FIBRILLATION ABLATION N/A 07/16/2013   PVI and CTI ablation by [name]   CARDIAC CATHETERIZATION     01/11/2019- Normal coronary arteries.    COLONOSCOPY  09/10/2011   Dr. Arnoldo Morale: normal, 10 year follow up.   implantable loop recorder removal  04/25/2021   MDT LINQ removed by [name]   LEFT HEART CATH AND CORONARY ANGIOGRAPHY N/A 01/11/2019   Procedure: LEFT HEART CATH AND CORONARY ANGIOGRAPHY;  Surgeon: Lorretta Harp, MD;  Location:  [place] [facility] LAB;  Service: Cardiovascular;  Laterality: N/A;   LESION REMOVAL N/A 05/03/2013   Procedure: EXCISION CYST, BACK;  Surgeon: Jamesetta So, MD;  Location: [facility];  Service: General;  Laterality: N/A;   LOOP RECORDER IMPLANT N/A 07/26/2014   Procedure: LOOP RECORDER IMPLANT;  Surgeon: Thompson Grayer, MD;  Location: [facility] CATH LAB;  Service: Cardiovascular;  Laterality: N/A;   LOOP RECORDER INSERTION N/A 10/17/2017   MDT previously implanted ILR for afib management at [facility].  old device removed and new device placed by [name]   LOOP RECORDER REMOVAL N/A 10/17/2017   MDT ILR removed with new device subsequently replaced   SHOULDER ARTHROSCOPY WITH ROTATOR CUFF REPAIR AND SUBACROMIAL DECOMPRESSION Left 01/07/2019   Procedure: Left shoulder subacromial decompression, distal clavicle resection, extensive debridement,;  Surgeon: Nicholes Stairs, MD;  Location: [facility];  Service: Orthopedics;  Laterality: Left;   TEE WITHOUT CARDIOVERSION N/A 07/15/2013   Procedure: TRANSESOPHAGEAL ECHOCARDIOGRAM (TEE);  Surgeon: Lelon Perla, MD;  Location: [facility] ENDOSCOPY;  Service: Cardiovascular;  Laterality: N/A;   TONSILLECTOMY  age 65   TRANSANAL HEMORRHOIDAL DEARTERIALIZATION N/A 09/22/2015   Procedure: TRANSANAL HEMORRHOIDAL LIGATION/PEXY EUA POSSIBLE HEMORRHOIDECTOMY ;  Surgeon: Michael Boston, MD;  Location: [facility];  Service: General;  Laterality: N/A;   Current Outpatient Medications on File Prior to Visit  Medication Sig Dispense Refill   amLODipine (NORVASC) 5 MG tablet Take 0.5 tablets (2.5 mg total) by mouth daily. 45 tablet 3   apixaban (ELIQUIS) 5 MG TABS tablet Take 1 tablet (5 mg total) by mouth 2 (two) times daily. 60 tablet 3   diclofenac Sodium (VOLTAREN) 1 % GEL Apply 2 g topically 4 (four) times daily. 100 g 2   esomeprazole (NEXIUM) 20 MG capsule Take 20 mg by mouth daily.      Glucose Blood (BLOOD GLUCOSE TEST STRIPS) STRP Use as directed to monitor FSBS  1x daily. Dx: E11.9. please dispense as Freestyle Precision Neo 50 strip 11   hydrochlorothiazide (HYDRODIURIL) 25 MG tablet TAKE 1 TABLET BY MOUTH EVERY MORNING FOR FLUID RETENTION 90 tablet 0   HYDROcodone-acetaminophen (NORCO) 10-325 MG tablet Take 1 tablet by mouth every 6 (six) hours as needed. 120 tablet 0   Lancets (FREESTYLE) lancets USE TO CHECK BLOOD SUGAR DAILY 100 each 2   lisinopril (ZESTRIL) 40 MG tablet TAKE 1 TABLET(40 MG) BY MOUTH DAILY 90 tablet 3   metFORMIN (GLUCOPHAGE) 500 MG tablet TAKE 1 TABLET(500 MG) BY MOUTH DAILY 90 tablet 3   metoprolol succinate (TOPROL XL) 25 MG 24 hr tablet Take 0.5 tablets (12.5 mg total) by mouth daily. 45 tablet 3   nitroGLYCERIN (NITROSTAT) 0.4 MG SL tablet Place 1 tablet (0.4 mg total) under  the tongue every 5 (five) minutes as needed for chest pain (up to 3 tablets, if you are taking the 3rd tablet call 911). 25 tablet 3   ondansetron (ZOFRAN) 4 MG tablet Take 1 tablet (4 mg total) by mouth every 8 (eight) hours as needed for nausea or vomiting. 20 tablet 0   Potassium Gluconate 550 MG TABS Take 550 mg by mouth at bedtime.      promethazine (PHENERGAN) 12.5 MG tablet Take 1 tablet (12.5 mg total) by mouth every 6 (six) hours as needed for nausea or vomiting. 30 tablet 3   simvastatin (ZOCOR) 40 MG tablet TAKE 1 TABLET(40 MG) BY MOUTH AT BEDTIME 90 tablet 1   azelastine (ASTELIN) 0.1 % nasal spray Place 2 sprays into both nostrils 2 (two) times daily. Use in each nostril as directed (Patient not taking: Reported on 08/07/2021) 30 mL 12   No current facility-administered medications on file prior to visit.   Allergies  Allergen Reactions   Clindamycin/Lincomycin Rash   Doxycycline Other (See Comments)    Makes heart race   Keflex [Cephalexin] Nausea And Vomiting   Other    Sulfonic Acid (3,5-Dibromo-4-H-Ox-Benz)    Adhesive [Tape] Rash   Latex Rash   Sulfonamide Derivatives Nausea And Vomiting   Social History   Socioeconomic History    Marital status: Married    Spouse name: Not on file   Number of children: Not on file   Years of education: Not on file   Highest education level: Not on file  Occupational History   Occupation: Pensions consultant: RETIRED    Comment: Full time  Tobacco Use   Smoking status: Former    Years: 34.00    Types: Cigarettes    Quit date: 01/02/1994    Years since quitting: 27.6   Smokeless tobacco: Never  Vaping Use   Vaping Use: Never used  Substance and Sexual Activity   Alcohol use: No   Drug use: No   Sexual activity: Not on file  Other Topics Concern   Not on file  Social History Narrative   Lives with spouse in [place] Resource Strain: Low Risk    Difficulty of Paying Living Expenses: Not hard at all  Food Insecurity: No Food Insecurity   Worried About Charity fundraiser in the Last Year: Never true   [place] in the Last Year: Never true  Transportation Needs: No Transportation Needs   Lack of Transportation (Medical): No   Lack of Transportation (Non-Medical): No  Physical Activity: Inactive   Days of Exercise per Week: 0 days   Minutes of Exercise per Session: 0 min  Stress: No Stress Concern Present   Feeling of Stress : Not at all  Social Connections: Moderately Integrated   Frequency of Communication with Friends and Family: Three times a week   Frequency of Social Gatherings with Friends and Family: Once a week   Attends Religious Services: More than 4 times per year   Active Member of [employer] or Organizations: No   Attends Archivist Meetings: Never   Marital Status: Married  Human resources officer Violence: Not At Risk   Fear of Current or Ex-Partner: No   Emotionally Abused: No   Physically Abused: No   Sexually Abused: No     Review of Systems  All other systems reviewed and are negative.     Objective:   Physical  Exam Vitals reviewed.  Constitutional:      General: She is not  in acute distress.    Appearance: Normal appearance. She is not ill-appearing or toxic-appearing.  Eyes:     Extraocular Movements: Extraocular movements intact.     Conjunctiva/sclera: Conjunctivae normal.     Pupils: Pupils are equal, round, and reactive to light.  Cardiovascular:     Rate and Rhythm: Normal rate and regular rhythm.     Pulses: Normal pulses.     Heart sounds: Normal heart sounds. No murmur heard.   No friction rub. No gallop.  Pulmonary:     Effort: Pulmonary effort is normal. No respiratory distress.     Breath sounds: Normal breath sounds. No stridor. No wheezing, rhonchi or rales.  Abdominal:     General: Abdomen is flat. Bowel sounds are normal. There is no distension.     Palpations: Abdomen is soft.     Tenderness: There is no abdominal tenderness. There is no guarding.  Musculoskeletal:     Cervical back: Neck supple.     Right lower leg: No edema.     Left lower leg: No edema.  Skin:    General: Skin is warm.     Coloration: Skin is not jaundiced.     Findings: No bruising, erythema, lesion or rash.  Neurological:     General: No focal deficit present.     Mental Status: She is alert and oriented to person, place, and time.     Cranial Nerves: No cranial nerve deficit.     Sensory: No sensory deficit.     Motor: No weakness.     Coordination: Coordination normal.     Gait: Gait normal.     Deep Tendon Reflexes: Reflexes normal.  Psychiatric:        Mood and Affect: Mood normal.        Thought Content: Thought content normal.          Assessment & Plan:  Chronic fatigue Lab work is reassuring.  Echocardiogram is reassuring.  At the present time no abnormalities were identified.  We discussed instituting work-up for occult malignancy.  However patient feels that she is doing better at the present time she defers further work-up.  I feel the majority of her fatigue is likely caregiver fatigue.  Today she denies any dyspnea.  Therefore we will  simply clinically monitor the patient moving forward for any change.

## 2021-08-11 ENCOUNTER — Other Ambulatory Visit: Payer: Self-pay | Admitting: Family Medicine

## 2021-08-24 ENCOUNTER — Other Ambulatory Visit: Payer: Self-pay

## 2021-08-24 ENCOUNTER — Other Ambulatory Visit: Payer: PPO

## 2021-08-24 DIAGNOSIS — R300 Dysuria: Secondary | ICD-10-CM | POA: Diagnosis not present

## 2021-08-24 LAB — URINALYSIS, ROUTINE W REFLEX MICROSCOPIC
Bilirubin Urine: NEGATIVE
Glucose, UA: NEGATIVE
Ketones, ur: NEGATIVE
Leukocytes,Ua: NEGATIVE
Nitrite: NEGATIVE
Protein, ur: NEGATIVE
Specific Gravity, Urine: 1.01 (ref 1.001–1.035)
pH: 5.5 (ref 5.0–8.0)

## 2021-08-24 LAB — MICROSCOPIC MESSAGE

## 2021-08-27 ENCOUNTER — Ambulatory Visit (INDEPENDENT_AMBULATORY_CARE_PROVIDER_SITE_OTHER): Payer: PPO | Admitting: Family Medicine

## 2021-08-27 ENCOUNTER — Other Ambulatory Visit: Payer: Self-pay

## 2021-08-27 ENCOUNTER — Encounter: Payer: Self-pay | Admitting: Family Medicine

## 2021-08-27 VITALS — BP 132/68 | HR 62 | Temp 97.3°F | Resp 18 | Ht 65.0 in | Wt 169.0 lb

## 2021-08-27 DIAGNOSIS — R300 Dysuria: Secondary | ICD-10-CM | POA: Diagnosis not present

## 2021-08-27 LAB — MICROSCOPIC MESSAGE

## 2021-08-27 LAB — URINALYSIS, ROUTINE W REFLEX MICROSCOPIC
Bilirubin Urine: NEGATIVE
Glucose, UA: NEGATIVE
Hgb urine dipstick: NEGATIVE
Ketones, ur: NEGATIVE
Nitrite: NEGATIVE
Protein, ur: NEGATIVE
Specific Gravity, Urine: 1.015 (ref 1.001–1.035)
pH: 7 (ref 5.0–8.0)

## 2021-08-27 MED ORDER — HYDROCODONE-ACETAMINOPHEN 10-325 MG PO TABS: 1.0000 | ORAL_TABLET | Freq: Four times a day (QID) | ORAL | 0 refills | Status: DC | PRN

## 2021-08-27 MED ORDER — SULFAMETHOXAZOLE-TRIMETHOPRIM 800-160 MG PO TABS: 1.0000 | ORAL_TABLET | Freq: Two times a day (BID) | ORAL | 0 refills | Status: DC

## 2021-08-27 NOTE — Progress Notes (Signed)
Subjective:    Patient ID: Erika Lee, female    DOB: 04/09/1946, 77 y.o.   MRN: [id number] Patient came by Friday and left a urine sample due to dysuria.  Urinalysis showed a trace amount of blood but was otherwise unremarkable.  Quantity was not sufficient for microscopy and culture.  Over the weekend symptoms got worse.  She reports progressive dysuria.  She states that she saw some pus come out of her urethra today.  She reports increased frequency and urgency.  Urinalysis today shows no blood, negative nitrates, but +2 leukocyte Estrace.  With a chaperone present I performed a pelvic exam.  There is no abscess or cyst surrounding the urethra.  There is no evidence of cellulitis.  There is no evidence of a yeast infection.  The labia majora and labia minora appear normal with no swelling or gross.  The clitoral hood is normal in appearance.  There is atrophic vaginitis there is no evidence of any skin cancer. Past Medical History:  Diagnosis Date   Anticoagulant long-term use    eliquis   Chronic sinusitis    GERD (gastroesophageal reflux disease)    Hemorrhoids    History of colitis 10/2016   campylobacter   Hyperlipidemia    Hypertension    Multinodular goiter    per pt has had 2 biopsy's both benign   NSTEMI (non-ST elevated myocardial infarction) (Erika Lee)    12/2018- normal coronary arteries on cath, poss coronary vasospasm.    Osteoarthritis    "all over"  and Erika Lee   Paroxysmal atrial fibrillation Erika Lee) EP cardiologist--  Erika Lee   s/p PVI by Erika Lee 07/16/2013;   pt first dx 2008,  recurrence 2014 afib/flutter and tachy-brady   Rotator cuff tear, left    Lee impingement, left    Status post placement of implantable loop recorder    original placement 07-26-2014;  removal / replacement 10-17-2017  by Erika Lee   Type 2 diabetes mellitus ([place])    followed by pcp   Past Surgical History:  Procedure Laterality Date   ATRIAL FIBRILLATION ABLATION N/A  07/16/2013   PVI and CTI ablation by Erika Lee   CARDIAC CATHETERIZATION     01/11/2019- Normal coronary arteries.    COLONOSCOPY  09/10/2011   Erika. Arnoldo Lee: normal, 10 year follow up.   implantable loop recorder removal  04/25/2021   MDT LINQ removed by Erika Lee   LEFT HEART CATH AND CORONARY ANGIOGRAPHY N/A 01/11/2019   Procedure: LEFT HEART CATH AND CORONARY ANGIOGRAPHY;  Surgeon: Erika Harp, Lee;  Location: [place] [facility] LAB;  Service: Cardiovascular;  Laterality: N/A;   LESION REMOVAL N/A 05/03/2013   Procedure: EXCISION CYST, BACK;  Surgeon: Erika Lee;  Location: [facility];  Service: General;  Laterality: N/A;   LOOP RECORDER IMPLANT N/A 07/26/2014   Procedure: LOOP RECORDER IMPLANT;  Surgeon: Erika Grayer, Lee;  Location: [facility] CATH LAB;  Service: Cardiovascular;  Laterality: N/A;   LOOP RECORDER INSERTION N/A 10/17/2017   MDT previously implanted ILR for afib management at [facility].  old device removed and new device placed by Erika Lee   LOOP RECORDER REMOVAL N/A 10/17/2017   MDT ILR removed with new device subsequently replaced   Lee ARTHROSCOPY WITH ROTATOR CUFF REPAIR AND SUBACROMIAL DECOMPRESSION Left 01/07/2019   Procedure: Left Lee subacromial decompression, distal clavicle resection, extensive debridement,;  Surgeon: Erika Stairs, Lee;  Location: [facility];  Service: Orthopedics;  Laterality: Left;   TEE WITHOUT CARDIOVERSION N/A 07/15/2013   Procedure: TRANSESOPHAGEAL ECHOCARDIOGRAM (TEE);  Surgeon: Erika Perla, Lee;  Location: [facility];  Service: Cardiovascular;  Laterality: N/A;   TONSILLECTOMY  age 43   TRANSANAL HEMORRHOIDAL DEARTERIALIZATION N/A 09/22/2015   Procedure: TRANSANAL HEMORRHOIDAL LIGATION/PEXY EUA POSSIBLE HEMORRHOIDECTOMY ;  Surgeon: Erika Boston, Lee;  Location: [facility];  Service: General;  Laterality: N/A;   Current Outpatient Medications on File Prior to Visit  Medication Sig Dispense Refill   amLODipine  (NORVASC) 5 MG tablet Take 0.5 tablets (2.5 mg total) by mouth daily. 45 tablet 3   apixaban (ELIQUIS) 5 MG TABS tablet Take 1 tablet (5 mg total) by mouth 2 (two) times daily. 60 tablet 3   diclofenac Sodium (VOLTAREN) 1 % GEL Apply 2 g topically 4 (four) times daily. 100 g 2   esomeprazole (NEXIUM) 20 MG capsule Take 20 mg by mouth daily.      Glucose Blood (BLOOD GLUCOSE TEST STRIPS) STRP Use as directed to monitor FSBS 1x daily. Dx: E11.9. please dispense as Freestyle Precision Neo 50 strip 11   hydrochlorothiazide (HYDRODIURIL) 25 MG tablet TAKE 1 TABLET BY MOUTH EVERY MORNING FOR FLUID RETENTION 90 tablet 0   Lancets (FREESTYLE) lancets USE TO CHECK BLOOD SUGAR DAILY 100 each 2   lisinopril (ZESTRIL) 40 MG tablet TAKE 1 TABLET(40 MG) BY MOUTH DAILY 90 tablet 3   metFORMIN (GLUCOPHAGE) 500 MG tablet TAKE 1 TABLET(500 MG) BY MOUTH DAILY 90 tablet 3   metoprolol succinate (TOPROL XL) 25 MG 24 hr tablet Take 0.5 tablets (12.5 mg total) by mouth daily. 45 tablet 3   nitroGLYCERIN (NITROSTAT) 0.4 MG SL tablet Place 1 tablet (0.4 mg total) under the tongue every 5 (five) minutes as needed for chest pain (up to 3 tablets, if you are taking the 3rd tablet call 911). 25 tablet 3   ondansetron (ZOFRAN) 4 MG tablet Take 1 tablet (4 mg total) by mouth every 8 (eight) hours as needed for nausea or vomiting. 20 tablet 0   Potassium Gluconate 550 MG TABS Take 550 mg by mouth at bedtime.      promethazine (PHENERGAN) 12.5 MG tablet TAKE 1 TABLET(12.5 MG) BY MOUTH EVERY 6 HOURS AS NEEDED FOR NAUSEA OR VOMITING 30 tablet 3   simvastatin (ZOCOR) 40 MG tablet TAKE 1 TABLET(40 MG) BY MOUTH AT BEDTIME 90 tablet 1   azelastine (ASTELIN) 0.1 % nasal spray Place 2 sprays into both nostrils 2 (two) times daily. Use in each nostril as directed (Patient not taking: Reported on 08/07/2021) 30 mL 12   No current facility-administered medications on file prior to visit.   Allergies  Allergen Reactions    Clindamycin/Lincomycin Rash   Doxycycline Other (See Comments)    Makes heart race   Keflex [Cephalexin] Nausea And Vomiting   Other    Sulfonic Acid (3,5-Dibromo-4-H-Ox-Benz)    Adhesive [Tape] Rash   Latex Rash   Sulfonamide Derivatives Nausea And Vomiting   Social History   Socioeconomic History   Marital status: Married    Spouse name: Not on file   Number of children: Not on file   Years of education: Not on file   Highest education level: Not on file  Occupational History   Occupation: Pensions consultant: RETIRED    Comment: Full time  Tobacco Use   Smoking status: Former    Years: 34.00    Types: Cigarettes    Quit date: 01/02/1994  Years since quitting: 27.6   Smokeless tobacco: Never  Vaping Use   Vaping Use: Never used  Substance and Sexual Activity   Alcohol use: No   Drug use: No   Sexual activity: Not on file  Other Topics Concern   Not on file  Social History Narrative   Lives with spouse in [place] Determinants of Health   Financial Resource Strain: Low Risk    Difficulty of Paying Living Expenses: Not hard at all  Food Insecurity: No Food Insecurity   Worried About Charity fundraiser in the Last Year: Never true   [place] in the Last Year: Never true  Transportation Needs: No Transportation Needs   Lack of Transportation (Medical): No   Lack of Transportation (Non-Medical): No  Physical Activity: Inactive   Days of Exercise per Week: 0 days   Minutes of Exercise per Session: 0 min  Stress: No Stress Concern Present   Feeling of Stress : Not at all  Social Connections: Moderately Integrated   Frequency of Communication with Friends and Family: Three times a week   Frequency of Social Gatherings with Friends and Family: Once a week   Attends Religious Services: More than 4 times per year   Active Member of [employer] or Organizations: No   Attends Archivist Meetings: Never   Marital Status: Married   Human resources officer Violence: Not At Risk   Fear of Current or Ex-Partner: No   Emotionally Abused: No   Physically Abused: No   Sexually Abused: No     Review of Systems  All other systems reviewed and are negative.     Objective:   Physical Exam Vitals reviewed. Exam conducted with a chaperone present.  Constitutional:      General: She is not in acute distress.    Appearance: Normal appearance. She is not ill-appearing or toxic-appearing.  Eyes:     Extraocular Movements: Extraocular movements intact.     Conjunctiva/sclera: Conjunctivae normal.     Pupils: Pupils are equal, round, and reactive to light.  Cardiovascular:     Rate and Rhythm: Normal rate and regular rhythm.     Pulses: Normal pulses.     Heart sounds: Normal heart sounds. No murmur heard.   No friction rub. No gallop.  Pulmonary:     Effort: Pulmonary effort is normal. No respiratory distress.     Breath sounds: Normal breath sounds. No stridor. No wheezing, rhonchi or rales.  Abdominal:     General: Abdomen is flat. Bowel sounds are normal. There is no distension.     Palpations: Abdomen is soft.     Tenderness: There is no abdominal tenderness. There is no guarding.  Genitourinary:    General: Normal vulva.     Exam position: Lithotomy position.     Pubic Area: No rash.   Musculoskeletal:     Cervical back: Neck supple.     Right lower leg: No edema.     Left lower leg: No edema.  Skin:    General: Skin is warm.     Coloration: Skin is not jaundiced.     Findings: No bruising, erythema, lesion or rash.  Neurological:     General: No focal deficit present.     Mental Status: She is alert and oriented to person, place, and time.     Cranial Nerves: No cranial nerve deficit.     Sensory: No sensory deficit.     Motor:  No weakness.     Coordination: Coordination normal.     Gait: Gait normal.     Deep Tendon Reflexes: Reflexes normal.  Psychiatric:        Mood and Affect: Mood normal.         Thought Content: Thought content normal.          Assessment & Plan:  Dysuria - Plan: Urinalysis, Routine w reflex microscopic Begin Bactrim double strength tablets twice daily for 7 days and then reassess if no better in 1 week.

## 2021-08-29 DIAGNOSIS — M1712 Unilateral primary osteoarthritis, left knee: Secondary | ICD-10-CM | POA: Diagnosis not present

## 2021-08-29 DIAGNOSIS — M170 Bilateral primary osteoarthritis of knee: Secondary | ICD-10-CM | POA: Diagnosis not present

## 2021-08-29 DIAGNOSIS — M1711 Unilateral primary osteoarthritis, right knee: Secondary | ICD-10-CM | POA: Diagnosis not present

## 2021-09-06 ENCOUNTER — Other Ambulatory Visit: Payer: Self-pay

## 2021-09-06 ENCOUNTER — Encounter: Payer: Self-pay | Admitting: Family Medicine

## 2021-09-06 ENCOUNTER — Ambulatory Visit (INDEPENDENT_AMBULATORY_CARE_PROVIDER_SITE_OTHER): Payer: PPO | Admitting: Family Medicine

## 2021-09-06 VITALS — BP 124/78 | HR 56 | Temp 97.5°F | Resp 18 | Ht 65.0 in | Wt 169.0 lb

## 2021-09-06 DIAGNOSIS — R300 Dysuria: Secondary | ICD-10-CM | POA: Diagnosis not present

## 2021-09-06 DIAGNOSIS — R110 Nausea: Secondary | ICD-10-CM | POA: Diagnosis not present

## 2021-09-06 LAB — MICROSCOPIC MESSAGE

## 2021-09-06 LAB — URINALYSIS, ROUTINE W REFLEX MICROSCOPIC
Bilirubin Urine: NEGATIVE
Glucose, UA: NEGATIVE
Hyaline Cast: NONE SEEN /LPF
Ketones, ur: NEGATIVE
Nitrite: NEGATIVE
Specific Gravity, Urine: 1.01 (ref 1.001–1.035)
pH: 6.5 (ref 5.0–8.0)

## 2021-09-06 MED ORDER — ONDANSETRON HCL 4 MG PO TABS: 4.0000 mg | ORAL_TABLET | Freq: Three times a day (TID) | ORAL | 0 refills | Status: DC | PRN

## 2021-09-06 MED ORDER — CIPROFLOXACIN HCL 500 MG PO TABS
500.0000 mg | ORAL_TABLET | Freq: Two times a day (BID) | ORAL | 0 refills | Status: AC
Start: 2021-09-06 — End: 2021-09-11

## 2021-09-06 MED ORDER — CHLORHEXIDINE GLUCONATE 4 % EX LIQD: Freq: Every day | CUTANEOUS | 1 refills | Status: DC | PRN

## 2021-09-06 NOTE — Progress Notes (Signed)
Subjective:    Patient ID: Erika Lee, female    DOB: Jul 06, 1945, 76 y.o.   MRN: [id number] 08/27/21 Patient came by Friday and left a urine sample due to dysuria.  Urinalysis showed a trace amount of blood but was otherwise unremarkable.  Quantity was not sufficient for microscopy and culture.  Over the weekend symptoms got worse.  She reports progressive dysuria.  She states that she saw some pus come out of her urethra today.  She reports increased frequency and urgency.  Urinalysis today shows no blood, negative nitrates, but +2 leukocyte [place].  With a chaperone present I performed a pelvic exam.  There is no abscess or cyst surrounding the urethra.  There is no evidence of cellulitis.  There is no evidence of a yeast infection.  The labia majora and labia minora appear normal with no swelling or gross.  The clitoral hood is normal in appearance.  There is atrophic vaginitis there is no evidence of any skin cancer.  At that time, my plan was:  Begin Bactrim double strength tablets twice daily for 7 days and then reassess if no better in 1 week.  09/06/21 Patient was only able to take 3 days of the Bactrim.  She started developing severe nausea.  She is not vomiting however she does not feel like she can tolerate any more of antibiotics.  She denies any further dysuria or urgency or frequency.  She denies any diarrhea.  She denies any hematochezia or melena.  She denies any constipation.  She denies any fevers or chills or abdominal pain.  She denies any emesis. Past Medical History:  Diagnosis Date   Anticoagulant long-term use    eliquis   Chronic sinusitis    GERD (gastroesophageal reflux disease)    Hemorrhoids    History of colitis 10/2016   campylobacter   Hyperlipidemia    Hypertension    Multinodular goiter    per pt has had 2 biopsy's both benign   NSTEMI (non-ST elevated myocardial infarction) ([place])    12/2018- normal coronary arteries on cath, poss coronary vasospasm.     Osteoarthritis    "all over"  and [facility] joint left shoulder   Paroxysmal atrial fibrillation [facility]) EP cardiologist--  [name]   s/p PVI by [name] 07/16/2013;   pt first dx 2008,  recurrence 2014 afib/flutter and tachy-brady   Rotator cuff tear, left    Shoulder impingement, left    Status post placement of implantable loop recorder    original placement 07-26-2014;  removal / replacement 10-17-2017  by dr allred   Type 2 diabetes mellitus ([place])    followed by pcp   Past Surgical History:  Procedure Laterality Date   ATRIAL FIBRILLATION ABLATION N/A 07/16/2013   PVI and CTI ablation by [name]   CARDIAC CATHETERIZATION     01/11/2019- Normal coronary arteries.    COLONOSCOPY  09/10/2011   Dr. Arnoldo Morale: normal, 10 year follow up.   implantable loop recorder removal  04/25/2021   MDT LINQ removed by [name]   LEFT HEART CATH AND CORONARY ANGIOGRAPHY N/A 01/11/2019   Procedure: LEFT HEART CATH AND CORONARY ANGIOGRAPHY;  Surgeon: Lorretta Harp, MD;  Location: [place] [facility];  Service: Cardiovascular;  Laterality: N/A;   LESION REMOVAL N/A 05/03/2013   Procedure: EXCISION CYST, BACK;  Surgeon: Jamesetta So, MD;  Location: [facility];  Service: General;  Laterality: N/A;   LOOP RECORDER IMPLANT N/A 07/26/2014  Procedure: LOOP RECORDER IMPLANT;  Surgeon: Thompson Grayer, MD;  Location: [facility] CATH LAB;  Service: Cardiovascular;  Laterality: N/A;   LOOP RECORDER INSERTION N/A 10/17/2017   MDT previously implanted ILR for afib management at [facility].  old device removed and new device placed by [name]   LOOP RECORDER REMOVAL N/A 10/17/2017   MDT ILR removed with new device subsequently replaced   SHOULDER ARTHROSCOPY WITH ROTATOR CUFF REPAIR AND SUBACROMIAL DECOMPRESSION Left 01/07/2019   Procedure: Left shoulder subacromial decompression, distal clavicle resection, extensive debridement,;  Surgeon: Nicholes Stairs, MD;  Location: [facility];  Service: Orthopedics;   Laterality: Left;   TEE WITHOUT CARDIOVERSION N/A 07/15/2013   Procedure: TRANSESOPHAGEAL ECHOCARDIOGRAM (TEE);  Surgeon: Lelon Perla, MD;  Location: [facility] ENDOSCOPY;  Service: Cardiovascular;  Laterality: N/A;   TONSILLECTOMY  age 27   TRANSANAL HEMORRHOIDAL DEARTERIALIZATION N/A 09/22/2015   Procedure: TRANSANAL HEMORRHOIDAL LIGATION/PEXY EUA POSSIBLE HEMORRHOIDECTOMY ;  Surgeon: Michael Boston, MD;  Location: [facility];  Service: General;  Laterality: N/A;   Current Outpatient Medications on File Prior to Visit  Medication Sig Dispense Refill   amLODipine (NORVASC) 5 MG tablet Take 0.5 tablets (2.5 mg total) by mouth daily. 45 tablet 3   apixaban (ELIQUIS) 5 MG TABS tablet Take 1 tablet (5 mg total) by mouth 2 (two) times daily. 60 tablet 3   azelastine (ASTELIN) 0.1 % nasal spray Place 2 sprays into both nostrils 2 (two) times daily. Use in each nostril as directed 30 mL 12   diclofenac Sodium (VOLTAREN) 1 % GEL Apply 2 g topically 4 (four) times daily. 100 g 2   esomeprazole (NEXIUM) 20 MG capsule Take 20 mg by mouth daily.      Glucose Blood (BLOOD GLUCOSE TEST STRIPS) STRP Use as directed to monitor FSBS 1x daily. Dx: E11.9. please dispense as Freestyle Precision Neo 50 strip 11   hydrochlorothiazide (HYDRODIURIL) 25 MG tablet TAKE 1 TABLET BY MOUTH EVERY MORNING FOR FLUID RETENTION 90 tablet 0   HYDROcodone-acetaminophen (NORCO) 10-325 MG tablet Take 1 tablet by mouth every 6 (six) hours as needed. 120 tablet 0   Lancets (FREESTYLE) lancets USE TO CHECK BLOOD SUGAR DAILY 100 each 2   lisinopril (ZESTRIL) 40 MG tablet TAKE 1 TABLET(40 MG) BY MOUTH DAILY 90 tablet 3   metFORMIN (GLUCOPHAGE) 500 MG tablet TAKE 1 TABLET(500 MG) BY MOUTH DAILY 90 tablet 3   metoprolol succinate (TOPROL XL) 25 MG 24 hr tablet Take 0.5 tablets (12.5 mg total) by mouth daily. 45 tablet 3   nitroGLYCERIN (NITROSTAT) 0.4 MG SL tablet Place 1 tablet (0.4 mg total) under the tongue every 5 (five) minutes as needed for  chest pain (up to 3 tablets, if you are taking the 3rd tablet call 911). 25 tablet 3   ondansetron (ZOFRAN) 4 MG tablet Take 1 tablet (4 mg total) by mouth every 8 (eight) hours as needed for nausea or vomiting. 20 tablet 0   Potassium Gluconate 550 MG TABS Take 550 mg by mouth at bedtime.      promethazine (PHENERGAN) 12.5 MG tablet TAKE 1 TABLET(12.5 MG) BY MOUTH EVERY 6 HOURS AS NEEDED FOR NAUSEA OR VOMITING 30 tablet 3   simvastatin (ZOCOR) 40 MG tablet TAKE 1 TABLET(40 MG) BY MOUTH AT BEDTIME 90 tablet 1   sulfamethoxazole-trimethoprim (BACTRIM DS) 800-160 MG tablet Take 1 tablet by mouth 2 (two) times daily. (Patient not taking: Reported on 09/06/2021) 14 tablet 0   No current facility-administered medications on  file prior to visit.   Allergies  Allergen Reactions   Clindamycin/Lincomycin Rash   Doxycycline Other (See Comments)    Makes heart race   Keflex [Cephalexin] Nausea And Vomiting   Other    Sulfonic Acid (3,5-Dibromo-4-H-Ox-Benz)    Adhesive [Tape] Rash   Latex Rash   Sulfonamide Derivatives Nausea And Vomiting   Social History   Socioeconomic History   Marital status: Married    Spouse name: Not on file   Number of children: Not on file   Years of education: Not on file   Highest education level: Not on file  Occupational History   Occupation: Pensions consultant: RETIRED    Comment: Full time  Tobacco Use   Smoking status: Former    Years: 34.00    Types: Cigarettes    Quit date: 01/02/1994    Years since quitting: 27.6   Smokeless tobacco: Never  Vaping Use   Vaping Use: Never used  Substance and Sexual Activity   Alcohol use: No   Drug use: No   Sexual activity: Not on file  Other Topics Concern   Not on file  Social History Narrative   Lives with spouse in [place] Resource Strain: Low Risk    Difficulty of Paying Living Expenses: Not hard at all  Food Insecurity: No Food Insecurity    Worried About Charity fundraiser in the Last Year: Never true   [place] in the Last Year: Never true  Transportation Needs: No Transportation Needs   Lack of Transportation (Medical): No   Lack of Transportation (Non-Medical): No  Physical Activity: Inactive   Days of Exercise per Week: 0 days   Minutes of Exercise per Session: 0 min  Stress: No Stress Concern Present   Feeling of Stress : Not at all  Social Connections: Moderately Integrated   Frequency of Communication with Friends and Family: Three times a week   Frequency of Social Gatherings with Friends and Family: Once a week   Attends Religious Services: More than 4 times per year   Active Member of [employer] or Organizations: No   Attends Archivist Meetings: Never   Marital Status: Married  Human resources officer Violence: Not At Risk   Fear of Current or Ex-Partner: No   Emotionally Abused: No   Physically Abused: No   Sexually Abused: No     Review of Systems  All other systems reviewed and are negative.     Objective:   Physical Exam Vitals reviewed.  Constitutional:      General: She is not in acute distress.    Appearance: Normal appearance. She is not ill-appearing or toxic-appearing.  Eyes:     Extraocular Movements: Extraocular movements intact.     Conjunctiva/sclera: Conjunctivae normal.     Pupils: Pupils are equal, round, and reactive to light.  Cardiovascular:     Rate and Rhythm: Normal rate and regular rhythm.     Pulses: Normal pulses.     Heart sounds: Normal heart sounds. No murmur heard.   No friction rub. No gallop.  Pulmonary:     Effort: Pulmonary effort is normal. No respiratory distress.     Breath sounds: Normal breath sounds. No stridor. No wheezing, rhonchi or rales.  Abdominal:     General: Abdomen is flat. Bowel sounds are normal. There is no distension.     Palpations: Abdomen is soft.  Tenderness: There is no abdominal tenderness. There is no guarding.   Musculoskeletal:     Cervical back: Neck supple.     Right lower leg: No edema.     Left lower leg: No edema.  Skin:    General: Skin is warm.     Coloration: Skin is not jaundiced.     Findings: No bruising, erythema, lesion or rash.  Neurological:     General: No focal deficit present.     Mental Status: She is alert and oriented to person, place, and time.     Cranial Nerves: No cranial nerve deficit.     Sensory: No sensory deficit.     Motor: No weakness.     Coordination: Coordination normal.     Gait: Gait normal.     Deep Tendon Reflexes: Reflexes normal.  Psychiatric:        Mood and Affect: Mood normal.        Thought Content: Thought content normal.          Assessment & Plan:  Dysuria - Plan: Urinalysis, Routine w reflex microscopic  Nausea Urinalysis shows persistent pyuria with 2+ leukocyte esterase and trace blood.  Therefore I will switch her antibiotic from Bactrim which seems to be upsetting her stomach to Cipro 500 mg twice daily for 5 days.  The patient states that she cannot tolerate that medication.  Keflex also upsets her stomach which is forcing me to use Cipro.  I will also give the patient Zofran 4 mg every 8 hours as needed for nausea.

## 2021-09-07 ENCOUNTER — Telehealth: Payer: Self-pay

## 2021-09-07 ENCOUNTER — Other Ambulatory Visit: Payer: Self-pay

## 2021-09-07 NOTE — Telephone Encounter (Signed)
Pt came into office stating that she needs a prior authorization sent to insurance for these meds: ? ?ondansetron (ZOFRAN) 4 MG tablet  ?chlorhexidine (HIBICLENS) 4 %  ? ? ?Cb#: [phone] ?

## 2021-09-07 NOTE — Telephone Encounter (Signed)
PA for Zofran submitted by fax via CoverMyMeds ? ?Raye Sorrow ([employer]) ? ?No PA request for Hibiclens received.  ?

## 2021-09-07 NOTE — Telephone Encounter (Signed)
PA for Zofran has been denied with reason of not medically necessary as patient is not undergoing cancer treatment. No alternative provided.  ? ?Per pharmacy patient will have to purchase this and the Hibiclens over the counter.  ? ?Patient is aware. She will use GoodRX coupon pharmacy has offered her for the Zofran. She will purchase Hibiclens OTC. Nothing further needed at this time.  ? ?

## 2021-09-11 ENCOUNTER — Other Ambulatory Visit: Payer: Self-pay | Admitting: Family Medicine

## 2021-09-13 ENCOUNTER — Other Ambulatory Visit: Payer: Self-pay | Admitting: Family Medicine

## 2021-09-14 ENCOUNTER — Other Ambulatory Visit: Payer: Self-pay | Admitting: Family Medicine

## 2021-09-14 ENCOUNTER — Other Ambulatory Visit: Payer: PPO

## 2021-09-14 DIAGNOSIS — R300 Dysuria: Secondary | ICD-10-CM

## 2021-09-14 LAB — URINALYSIS, ROUTINE W REFLEX MICROSCOPIC
Bilirubin Urine: NEGATIVE
Casts: NONE SEEN /LPF
Crystals: NONE SEEN /HPF
Glucose, UA: NEGATIVE
Hgb urine dipstick: NEGATIVE
Hyaline Cast: NONE SEEN /LPF
Ketones, ur: NEGATIVE
Nitrite: NEGATIVE
Protein, ur: NEGATIVE
RBC / HPF: NONE SEEN /HPF (ref 0–2)
Specific Gravity, Urine: 1.01 (ref 1.001–1.035)
Yeast: NONE SEEN /HPF
pH: 7 (ref 5.0–8.0)

## 2021-09-14 LAB — MICROSCOPIC MESSAGE

## 2021-09-14 MED ORDER — NITROFURANTOIN MONOHYD MACRO 100 MG PO CAPS: 100.0000 mg | ORAL_CAPSULE | Freq: Two times a day (BID) | ORAL | 0 refills | Status: DC

## 2021-09-17 ENCOUNTER — Ambulatory Visit: Payer: PPO | Admitting: Family Medicine

## 2021-09-20 ENCOUNTER — Ambulatory Visit: Payer: PPO | Admitting: Family Medicine

## 2021-10-03 ENCOUNTER — Other Ambulatory Visit: Payer: Self-pay | Admitting: Family Medicine

## 2021-10-04 DIAGNOSIS — M9903 Segmental and somatic dysfunction of lumbar region: Secondary | ICD-10-CM | POA: Diagnosis not present

## 2021-10-04 DIAGNOSIS — M546 Pain in thoracic spine: Secondary | ICD-10-CM | POA: Diagnosis not present

## 2021-10-04 DIAGNOSIS — M62830 Muscle spasm of back: Secondary | ICD-10-CM | POA: Diagnosis not present

## 2021-10-04 DIAGNOSIS — M9901 Segmental and somatic dysfunction of cervical region: Secondary | ICD-10-CM | POA: Diagnosis not present

## 2021-10-04 DIAGNOSIS — M542 Cervicalgia: Secondary | ICD-10-CM | POA: Diagnosis not present

## 2021-10-04 DIAGNOSIS — M9902 Segmental and somatic dysfunction of thoracic region: Secondary | ICD-10-CM | POA: Diagnosis not present

## 2021-10-12 DIAGNOSIS — M9903 Segmental and somatic dysfunction of lumbar region: Secondary | ICD-10-CM | POA: Diagnosis not present

## 2021-10-12 DIAGNOSIS — M9901 Segmental and somatic dysfunction of cervical region: Secondary | ICD-10-CM | POA: Diagnosis not present

## 2021-10-12 DIAGNOSIS — M542 Cervicalgia: Secondary | ICD-10-CM | POA: Diagnosis not present

## 2021-10-12 DIAGNOSIS — M62830 Muscle spasm of back: Secondary | ICD-10-CM | POA: Diagnosis not present

## 2021-10-12 DIAGNOSIS — M546 Pain in thoracic spine: Secondary | ICD-10-CM | POA: Diagnosis not present

## 2021-10-12 DIAGNOSIS — M9902 Segmental and somatic dysfunction of thoracic region: Secondary | ICD-10-CM | POA: Diagnosis not present

## 2021-10-17 DIAGNOSIS — M9903 Segmental and somatic dysfunction of lumbar region: Secondary | ICD-10-CM | POA: Diagnosis not present

## 2021-10-17 DIAGNOSIS — M542 Cervicalgia: Secondary | ICD-10-CM | POA: Diagnosis not present

## 2021-10-17 DIAGNOSIS — M546 Pain in thoracic spine: Secondary | ICD-10-CM | POA: Diagnosis not present

## 2021-10-17 DIAGNOSIS — M62830 Muscle spasm of back: Secondary | ICD-10-CM | POA: Diagnosis not present

## 2021-10-17 DIAGNOSIS — M9901 Segmental and somatic dysfunction of cervical region: Secondary | ICD-10-CM | POA: Diagnosis not present

## 2021-10-17 DIAGNOSIS — M9902 Segmental and somatic dysfunction of thoracic region: Secondary | ICD-10-CM | POA: Diagnosis not present

## 2021-10-19 DIAGNOSIS — M9902 Segmental and somatic dysfunction of thoracic region: Secondary | ICD-10-CM | POA: Diagnosis not present

## 2021-10-19 DIAGNOSIS — M9903 Segmental and somatic dysfunction of lumbar region: Secondary | ICD-10-CM | POA: Diagnosis not present

## 2021-10-19 DIAGNOSIS — M546 Pain in thoracic spine: Secondary | ICD-10-CM | POA: Diagnosis not present

## 2021-10-19 DIAGNOSIS — M542 Cervicalgia: Secondary | ICD-10-CM | POA: Diagnosis not present

## 2021-10-19 DIAGNOSIS — M62830 Muscle spasm of back: Secondary | ICD-10-CM | POA: Diagnosis not present

## 2021-10-19 DIAGNOSIS — M9901 Segmental and somatic dysfunction of cervical region: Secondary | ICD-10-CM | POA: Diagnosis not present

## 2021-10-21 ENCOUNTER — Other Ambulatory Visit: Payer: Self-pay | Admitting: Family Medicine

## 2021-10-24 DIAGNOSIS — M9902 Segmental and somatic dysfunction of thoracic region: Secondary | ICD-10-CM | POA: Diagnosis not present

## 2021-10-24 DIAGNOSIS — M9903 Segmental and somatic dysfunction of lumbar region: Secondary | ICD-10-CM | POA: Diagnosis not present

## 2021-10-24 DIAGNOSIS — M546 Pain in thoracic spine: Secondary | ICD-10-CM | POA: Diagnosis not present

## 2021-10-24 DIAGNOSIS — M62830 Muscle spasm of back: Secondary | ICD-10-CM | POA: Diagnosis not present

## 2021-10-24 DIAGNOSIS — M542 Cervicalgia: Secondary | ICD-10-CM | POA: Diagnosis not present

## 2021-10-24 DIAGNOSIS — M9901 Segmental and somatic dysfunction of cervical region: Secondary | ICD-10-CM | POA: Diagnosis not present

## 2021-10-25 ENCOUNTER — Ambulatory Visit: Payer: PPO | Admitting: Family Medicine

## 2021-10-25 ENCOUNTER — Ambulatory Visit (INDEPENDENT_AMBULATORY_CARE_PROVIDER_SITE_OTHER): Payer: PPO | Admitting: Family Medicine

## 2021-10-25 VITALS — BP 140/78 | HR 77 | Temp 98.4°F | Ht 65.0 in | Wt 158.0 lb

## 2021-10-25 DIAGNOSIS — I2510 Atherosclerotic heart disease of native coronary artery without angina pectoris: Secondary | ICD-10-CM

## 2021-10-25 DIAGNOSIS — I10 Essential (primary) hypertension: Secondary | ICD-10-CM | POA: Diagnosis not present

## 2021-10-25 DIAGNOSIS — E118 Type 2 diabetes mellitus with unspecified complications: Secondary | ICD-10-CM

## 2021-10-25 DIAGNOSIS — M7989 Other specified soft tissue disorders: Secondary | ICD-10-CM

## 2021-10-25 DIAGNOSIS — I480 Paroxysmal atrial fibrillation: Secondary | ICD-10-CM

## 2021-10-25 MED ORDER — HYDROCODONE-ACETAMINOPHEN 10-325 MG PO TABS: 1.0000 | ORAL_TABLET | Freq: Four times a day (QID) | ORAL | 0 refills | Status: DC | PRN

## 2021-10-25 NOTE — Progress Notes (Signed)
Wt Readings from Last 3 Encounters:  ?10/25/21 158 lb (71.7 kg)  ?09/06/21 169 lb (76.7 kg)  ?08/27/21 169 lb (76.7 kg)  ? ? ? ? ? ?Subjective:  ? ? Patient ID: Erika Lee, female    DOB: [birth year], 76 y.o.   MRN: [id number] ? ?Patient is a very sweet 76 year old Caucasian female who presents today for checkup.  Unfortunately her husband recently had a stroke and is now in a nursing home.  Per her report he seems to be decompensating quickly and has given up.  She is very tearful today.  Despite this, her blood pressure is good.  However she has +1 pitting edema in both legs today.  She also has faint right basilar crackles.  However her weight has dropped 10 pounds since I last saw her.  She states that she has not been eating well but she does appear fluid overloaded on exam.  I performed an echocardiogram in February that showed a normal ejection fraction but did have grade 1 diastolic dysfunction.  She denies any chest pain. ?Past Medical History:  ?Diagnosis Date  ? Anticoagulant long-term use   ? eliquis  ? Chronic sinusitis   ? GERD (gastroesophageal reflux disease)   ? Hemorrhoids   ? History of colitis 10/2016  ? campylobacter  ? Hyperlipidemia   ? Hypertension   ? Multinodular goiter   ? per pt has had 2 biopsy's both benign  ? NSTEMI (non-ST elevated myocardial infarction) ([place])   ? 12/2018- normal coronary arteries on cath, poss coronary vasospasm.   ? Osteoarthritis   ? "all over"  and AC joint left shoulder  ? Paroxysmal atrial fibrillation [facility]) EP cardiologist--  [name]  ? s/p PVI by [name] 07/16/2013;   pt first dx 2008,  recurrence 2014 afib/flutter and tachy-brady  ? Rotator cuff tear, left   ? Shoulder impingement, left   ? Status post placement of implantable loop recorder   ? original placement 07-26-2014;  removal / replacement 10-17-2017  by dr allred  ? Type 2 diabetes mellitus ([place])   ? followed by pcp  ? ?Past Surgical History:  ?Procedure Laterality Date  ? ATRIAL FIBRILLATION  ABLATION N/A 07/16/2013  ? PVI and CTI ablation by [name]  ? CARDIAC CATHETERIZATION    ? 01/11/2019- Normal coronary arteries.   ? COLONOSCOPY  09/10/2011  ? Dr. Arnoldo Morale: normal, 10 year follow up.  ? implantable loop recorder removal  04/25/2021  ? MDT LINQ removed by [name]  ? LEFT HEART CATH AND CORONARY ANGIOGRAPHY N/A 01/11/2019  ? Procedure: LEFT HEART CATH AND CORONARY ANGIOGRAPHY;  Surgeon: Lorretta Harp, MD;  Location: [place] [facility];  Service: Cardiovascular;  Laterality: N/A;  ? LESION REMOVAL N/A 05/03/2013  ? Procedure: EXCISION CYST, BACK;  Surgeon: Jamesetta So, MD;  Location: [facility];  Service: General;  Laterality: N/A;  ? LOOP RECORDER IMPLANT N/A 07/26/2014  ? Procedure: LOOP RECORDER IMPLANT;  Surgeon: Thompson Grayer, MD;  Location: [facility] CATH LAB;  Service: Cardiovascular;  Laterality: N/A;  ? LOOP RECORDER INSERTION N/A 10/17/2017  ? MDT previously implanted ILR for afib management at [facility].  old device removed and new device placed by [name]  ? LOOP RECORDER REMOVAL N/A 10/17/2017  ? MDT ILR removed with new device subsequently replaced  ? SHOULDER ARTHROSCOPY WITH ROTATOR CUFF REPAIR AND SUBACROMIAL DECOMPRESSION Left 01/07/2019  ? Procedure: Left shoulder subacromial decompression, distal clavicle resection, extensive debridement,;  Surgeon: Stann Mainland,  Elly Modena, MD;  Location: [facility];  Service: Orthopedics;  Laterality: Left;  ? TEE WITHOUT CARDIOVERSION N/A 07/15/2013  ? Procedure: TRANSESOPHAGEAL ECHOCARDIOGRAM (TEE);  Surgeon: Lelon Perla, MD;  Location: [facility] ENDOSCOPY;  Service: Cardiovascular;  Laterality: N/A;  ? TONSILLECTOMY  age 57  ? TRANSANAL HEMORRHOIDAL DEARTERIALIZATION N/A 09/22/2015  ? Procedure: TRANSANAL HEMORRHOIDAL LIGATION/PEXY EUA POSSIBLE HEMORRHOIDECTOMY ;  Surgeon: Michael Boston, MD;  Location: [facility];  Service: General;  Laterality: N/A;  ? ?Current Outpatient Medications on File Prior to Visit  ?Medication Sig Dispense Refill  ?  amLODipine (NORVASC) 5 MG tablet Take 0.5 tablets (2.5 mg total) by mouth daily. 45 tablet 3  ? azelastine (ASTELIN) 0.1 % nasal spray Place 2 sprays into both nostrils 2 (two) times daily. Use in each nostril as directed 30 mL 12  ? chlorhexidine (HIBICLENS) 4 % external liquid Apply topically daily as needed. 120 mL 1  ? diclofenac Sodium (VOLTAREN) 1 % GEL Apply 2 g topically 4 (four) times daily. 100 g 2  ? ELIQUIS 5 MG TABS tablet TAKE 1 TABLET(5 MG) BY MOUTH TWICE DAILY 60 tablet 3  ? esomeprazole (NEXIUM) 20 MG capsule Take 20 mg by mouth daily.     ? glucose blood (FREESTYLE PRECISION NEO TEST) test strip DX: E11.9 Use to check blood sugar daily 50 strip 11  ? hydrochlorothiazide (HYDRODIURIL) 25 MG tablet TAKE 1 TABLET BY MOUTH EVERY MORNING FOR FLUID RETENTION 90 tablet 3  ? HYDROcodone-acetaminophen (NORCO) 10-325 MG tablet Take 1 tablet by mouth every 6 (six) hours as needed. 120 tablet 0  ? Lancets (FREESTYLE) lancets USE TO CHECK BLOOD SUGAR DAILY 100 each 2  ? lisinopril (ZESTRIL) 40 MG tablet TAKE 1 TABLET(40 MG) BY MOUTH DAILY 90 tablet 3  ? metFORMIN (GLUCOPHAGE) 500 MG tablet TAKE 1 TABLET(500 MG) BY MOUTH DAILY 90 tablet 3  ? metoprolol succinate (TOPROL XL) 25 MG 24 hr tablet Take 0.5 tablets (12.5 mg total) by mouth daily. 45 tablet 3  ? nitroGLYCERIN (NITROSTAT) 0.4 MG SL tablet Place 1 tablet (0.4 mg total) under the tongue every 5 (five) minutes as needed for chest pain (up to 3 tablets, if you are taking the 3rd tablet call 911). 25 tablet 3  ? ondansetron (ZOFRAN) 4 MG tablet Take 1 tablet (4 mg total) by mouth every 8 (eight) hours as needed for nausea or vomiting. 20 tablet 0  ? Potassium Gluconate 550 MG TABS Take 550 mg by mouth at bedtime.     ? promethazine (PHENERGAN) 12.5 MG tablet TAKE 1 TABLET(12.5 MG) BY MOUTH EVERY 6 HOURS AS NEEDED FOR NAUSEA OR VOMITING 30 tablet 3  ? simvastatin (ZOCOR) 40 MG tablet TAKE 1 TABLET(40 MG) BY MOUTH AT BEDTIME 90 tablet 1  ? ?No current  facility-administered medications on file prior to visit.  ? ? ? ?Allergies  ?Allergen Reactions  ? Clindamycin/Lincomycin Rash  ? Doxycycline Other (See Comments)  ?  Makes heart race  ? Keflex [Cephalexin] Nausea And Vomiting  ? Other   ? Sulfonic Acid (3,5-Dibromo-4-H-Ox-Benz)   ? Adhesive [Tape] Rash  ? Latex Rash  ? Sulfonamide Derivatives Nausea And Vomiting  ? ?Social History  ? ?Socioeconomic History  ? Marital status: Married  ?  Spouse name: Not on file  ? Number of children: Not on file  ? Years of education: Not on file  ? Highest education level: Not on file  ?Occupational History  ? Occupation: Clinical biochemist  ?  Employer: RETIRED  ?  Comment: Full time  ?Tobacco Use  ? Smoking status: Former  ?  Years: 34.00  ?  Types: Cigarettes  ?  Quit date: 01/02/1994  ?  Years since quitting: 27.8  ? Smokeless tobacco: Never  ?Vaping Use  ? Vaping Use: Never used  ?Substance and Sexual Activity  ? Alcohol use: No  ? Drug use: No  ? Sexual activity: Not on file  ?Other Topics Concern  ? Not on file  ?Social History Narrative  ? Lives with spouse in [place]  ? ?Social Determinants of Health  ? ?Financial Resource Strain: Low Risk   ? Difficulty of Paying Living Expenses: Not hard at all  ?Food Insecurity: No Food Insecurity  ? Worried About Charity fundraiser in the Last Year: Never true  ? Ran Out of Food in the Last Year: Never true  ?Transportation Needs: No Transportation Needs  ? Lack of Transportation (Medical): No  ? Lack of Transportation (Non-Medical): No  ?Physical Activity: Inactive  ? Days of Exercise per Week: 0 days  ? Minutes of Exercise per Session: 0 min  ?Stress: No Stress Concern Present  ? Feeling of Stress : Not at all  ?Social Connections: Moderately Integrated  ? Frequency of Communication with Friends and Family: Three times a week  ? Frequency of Social Gatherings with Friends and Family: Once a week  ? Attends Religious Services: More than 4 times per year  ? Active Member of Clubs or  Organizations: No  ? Attends Archivist Meetings: Never  ? Marital Status: Married  ?Intimate Partner Violence: Not At Risk  ? Fear of Current or Ex-Partner: No  ? Emotionally Abused: No  ? Physically Ab

## 2021-10-26 LAB — HEMOGLOBIN A1C
Hgb A1c MFr Bld: 6.2 % of total Hgb — ABNORMAL HIGH (ref <5.7)
Mean Plasma Glucose: 131 mg/dL
eAG (mmol/L): 7.3 mmol/L

## 2021-10-26 LAB — COMPLETE METABOLIC PANEL WITH GFR
AG Ratio: 2 (calc) (ref 1.0–2.5)
ALT: 10 U/L (ref 6–29)
AST: 13 U/L (ref 10–35)
Albumin: 4.4 g/dL (ref 3.6–5.1)
Alkaline phosphatase (APISO): 73 U/L (ref 37–153)
BUN: 9 mg/dL (ref 7–25)
CO2: 27 mmol/L (ref 20–32)
Calcium: 10 mg/dL (ref 8.6–10.4)
Chloride: 94 mmol/L — ABNORMAL LOW (ref 98–110)
Creat: 0.75 mg/dL (ref 0.60–1.00)
Globulin: 2.2 g/dL (calc) (ref 1.9–3.7)
Glucose, Bld: 114 mg/dL — ABNORMAL HIGH (ref 65–99)
Potassium: 4.2 mmol/L (ref 3.5–5.3)
Sodium: 131 mmol/L — ABNORMAL LOW (ref 135–146)
Total Bilirubin: 0.5 mg/dL (ref 0.2–1.2)
Total Protein: 6.6 g/dL (ref 6.1–8.1)
eGFR: 82 mL/min/{1.73_m2} (ref >=60)

## 2021-10-26 LAB — CBC WITH DIFFERENTIAL/PLATELET
Absolute Monocytes: 666 cells/uL (ref 200–950)
Basophils Absolute: 42 cells/uL (ref 0–200)
Basophils Relative: 0.7 %
Eosinophils Absolute: 90 cells/uL (ref 15–500)
Eosinophils Relative: 1.5 %
HCT: 41.1 % (ref 35.0–45.0)
Hemoglobin: 13.7 g/dL (ref 11.7–15.5)
Lymphs Abs: 1848 cells/uL (ref 850–3900)
MCH: 29.5 pg (ref 27.0–33.0)
MCHC: 33.3 g/dL (ref 32.0–36.0)
MCV: 88.6 fL (ref 80.0–100.0)
MPV: 9.9 fL (ref 7.5–12.5)
Monocytes Relative: 11.1 %
Neutro Abs: 3354 cells/uL (ref 1500–7800)
Neutrophils Relative %: 55.9 %
Platelets: 207 10*3/uL (ref 140–400)
RBC: 4.64 10*6/uL (ref 3.80–5.10)
RDW: 12.5 % (ref 11.0–15.0)
Total Lymphocyte: 30.8 %
WBC: 6 10*3/uL (ref 3.8–10.8)

## 2021-10-26 LAB — LIPID PANEL
Cholesterol: 132 mg/dL (ref <200)
HDL: 53 mg/dL (ref >=50)
LDL Cholesterol (Calc): 59 mg/dL (calc)
Non-HDL Cholesterol (Calc): 79 mg/dL (calc) (ref <130)
Total CHOL/HDL Ratio: 2.5 (calc) (ref <5.0)
Triglycerides: 116 mg/dL (ref <150)

## 2021-10-26 LAB — BRAIN NATRIURETIC PEPTIDE: Brain Natriuretic Peptide: 63 pg/mL (ref <100)

## 2021-10-29 DIAGNOSIS — M546 Pain in thoracic spine: Secondary | ICD-10-CM | POA: Diagnosis not present

## 2021-10-29 DIAGNOSIS — M62830 Muscle spasm of back: Secondary | ICD-10-CM | POA: Diagnosis not present

## 2021-10-29 DIAGNOSIS — M9903 Segmental and somatic dysfunction of lumbar region: Secondary | ICD-10-CM | POA: Diagnosis not present

## 2021-10-29 DIAGNOSIS — M542 Cervicalgia: Secondary | ICD-10-CM | POA: Diagnosis not present

## 2021-10-29 DIAGNOSIS — M9901 Segmental and somatic dysfunction of cervical region: Secondary | ICD-10-CM | POA: Diagnosis not present

## 2021-10-29 DIAGNOSIS — M9902 Segmental and somatic dysfunction of thoracic region: Secondary | ICD-10-CM | POA: Diagnosis not present

## 2021-10-30 ENCOUNTER — Other Ambulatory Visit: Payer: Self-pay

## 2021-10-30 MED ORDER — FUROSEMIDE 20 MG PO TABS
20.0000 mg | ORAL_TABLET | Freq: Every day | ORAL | 1 refills | Status: DC
Start: 2021-10-30 — End: 2022-04-30

## 2021-11-13 ENCOUNTER — Ambulatory Visit (HOSPITAL_COMMUNITY)
Admission: RE | Admit: 2021-11-13 | Discharge: 2021-11-13 | Disposition: A | Discharge status: Home / Self Care | Payer: PPO | Source: Ambulatory Visit | Attending: Nurse Practitioner | Admitting: Nurse Practitioner

## 2021-11-13 ENCOUNTER — Other Ambulatory Visit: Payer: Self-pay | Admitting: Internal Medicine

## 2021-11-13 ENCOUNTER — Encounter (HOSPITAL_COMMUNITY): Payer: Self-pay | Admitting: Nurse Practitioner

## 2021-11-13 VITALS — BP 148/70 | HR 50 | Ht 65.0 in | Wt 157.8 lb

## 2021-11-13 DIAGNOSIS — I480 Paroxysmal atrial fibrillation: Secondary | ICD-10-CM

## 2021-11-13 DIAGNOSIS — I4891 Unspecified atrial fibrillation: Secondary | ICD-10-CM | POA: Diagnosis not present

## 2021-11-13 DIAGNOSIS — D6869 Other thrombophilia: Secondary | ICD-10-CM

## 2021-11-13 DIAGNOSIS — Z7901 Long term (current) use of anticoagulants: Secondary | ICD-10-CM | POA: Insufficient documentation

## 2021-11-13 NOTE — Progress Notes (Signed)
Patient ID: Erika Lee, female   DOB: Jan 03, 1946, 76 y.o.   MRN: [id number] ? ? ? ? ? ?Primary Care Physician: [name], MD ?Referring Physician: Dr. Rayann Heman  ? ? ?Erika Lee is a 76 y.o. female with a h/o afib s/p ablation,2015, that is in afib clinic for f/u. She reports that she is doing well without any symptoms of afib. No bleeding issues with apixaban. Overall, she is feeling well. Heart rate at home upper 40's to mid 50's. Not symptomatic with low heart rate.Had left shoulder surgery in [place] and 2 days later was in the ER with dyspnea, elevated troponins.  She had a LHC and had normal arteries. It was  thought chest pain may have been  from coronary spasm. The pt is concerned that it may have been from  the nerve block at time of shoulder surgery. She  Has not had any further chest pain.  Continues  on eliquis with a CHA2DS2VASc score of at least 5. ? ?F/u in afib clinic, 11/13/21. She remains in SR. No afib to report. Her husband had a stroke early April and remains in a SNF.  ? ?Today, she denies symptoms of palpitations, chest pain, shortness of breath, orthopnea, PND, lower extremity edema, dizziness, presyncope, syncope, or neurologic sequela. The patient is tolerating medications without difficulties and is otherwise without complaint today.  ? ?Past Medical History:  ?Diagnosis Date  ? Anticoagulant long-term use   ? eliquis  ? Chronic sinusitis   ? GERD (gastroesophageal reflux disease)   ? Hemorrhoids   ? History of colitis 10/2016  ? campylobacter  ? Hyperlipidemia   ? Hypertension   ? Multinodular goiter   ? per pt has had 2 biopsy's both benign  ? NSTEMI (non-ST elevated myocardial infarction) ([place])   ? 12/2018- normal coronary arteries on cath, poss coronary vasospasm.   ? Osteoarthritis   ? "all over"  and AC joint left shoulder  ? Paroxysmal atrial fibrillation [facility]) EP cardiologist--  [name]  ? s/p PVI by [name] 07/16/2013;   pt first dx 2008,  recurrence 2014 afib/flutter and  tachy-brady  ? Rotator cuff tear, left   ? Shoulder impingement, left   ? Status post placement of implantable loop recorder   ? original placement 07-26-2014;  removal / replacement 10-17-2017  by dr allred  ? Type 2 diabetes mellitus ([place])   ? followed by pcp  ? ?Past Surgical History:  ?Procedure Laterality Date  ? ATRIAL FIBRILLATION ABLATION N/A 07/16/2013  ? PVI and CTI ablation by [name]  ? CARDIAC CATHETERIZATION    ? 01/11/2019- Normal coronary arteries.   ? COLONOSCOPY  09/10/2011  ? Dr. Arnoldo Morale: normal, 10 year follow up.  ? implantable loop recorder removal  04/25/2021  ? MDT LINQ removed by [name]  ? LEFT HEART CATH AND CORONARY ANGIOGRAPHY N/A 01/11/2019  ? Procedure: LEFT HEART CATH AND CORONARY ANGIOGRAPHY;  Surgeon: Lorretta Harp, MD;  Location: [facility];  Service: Cardiovascular;  Laterality: N/A;  ? LESION REMOVAL N/A 05/03/2013  ? Procedure: EXCISION CYST, BACK;  Surgeon: Jamesetta So, MD;  Location: [facility];  Service: General;  Laterality: N/A;  ? LOOP RECORDER IMPLANT N/A 07/26/2014  ? Procedure: LOOP RECORDER IMPLANT;  Surgeon: Thompson Grayer, MD;  Location: [facility] CATH LAB;  Service: Cardiovascular;  Laterality: N/A;  ? LOOP RECORDER INSERTION N/A 10/17/2017  ? MDT previously implanted ILR for afib management at [facility].  old  device removed and new device placed by [name]  ? LOOP RECORDER REMOVAL N/A 10/17/2017  ? MDT ILR removed with new device subsequently replaced  ? SHOULDER ARTHROSCOPY WITH ROTATOR CUFF REPAIR AND SUBACROMIAL DECOMPRESSION Left 01/07/2019  ? Procedure: Left shoulder subacromial decompression, distal clavicle resection, extensive debridement,;  Surgeon: Nicholes Stairs, MD;  Location: [facility];  Service: Orthopedics;  Laterality: Left;  ? TEE WITHOUT CARDIOVERSION N/A 07/15/2013  ? Procedure: TRANSESOPHAGEAL ECHOCARDIOGRAM (TEE);  Surgeon: Lelon Perla, MD;  Location: [facility] ENDOSCOPY;  Service: Cardiovascular;  Laterality: N/A;  ?  TONSILLECTOMY  age 73  ? TRANSANAL HEMORRHOIDAL DEARTERIALIZATION N/A 09/22/2015  ? Procedure: TRANSANAL HEMORRHOIDAL LIGATION/PEXY EUA POSSIBLE HEMORRHOIDECTOMY ;  Surgeon: Michael Boston, MD;  Location: [facility];  Service: General;  Laterality: N/A;  ? ? ?Current Outpatient Medications  ?Medication Sig Dispense Refill  ? amLODipine (NORVASC) 5 MG tablet Take 0.5 tablets (2.5 mg total) by mouth daily. 45 tablet 3  ? azelastine (ASTELIN) 0.1 % nasal spray Place 2 sprays into both nostrils 2 (two) times daily. Use in each nostril as directed 30 mL 12  ? diclofenac Sodium (VOLTAREN) 1 % GEL Apply 2 g topically 4 (four) times daily. 100 g 2  ? ELIQUIS 5 MG TABS tablet TAKE 1 TABLET(5 MG) BY MOUTH TWICE DAILY 60 tablet 3  ? esomeprazole (NEXIUM) 20 MG capsule Take 20 mg by mouth daily.     ? furosemide (LASIX) 20 MG tablet Take 1 tablet (20 mg total) by mouth daily. 30 tablet 1  ? glucose blood (FREESTYLE PRECISION NEO TEST) test strip DX: E11.9 Use to check blood sugar daily 50 strip 11  ? HYDROcodone-acetaminophen (NORCO) 10-325 MG tablet Take 1 tablet by mouth every 6 (six) hours as needed. 120 tablet 0  ? Lancets (FREESTYLE) lancets USE TO CHECK BLOOD SUGAR DAILY 100 each 2  ? lisinopril (ZESTRIL) 40 MG tablet TAKE 1 TABLET(40 MG) BY MOUTH DAILY 90 tablet 3  ? metFORMIN (GLUCOPHAGE) 500 MG tablet TAKE 1 TABLET(500 MG) BY MOUTH DAILY 90 tablet 3  ? metoprolol succinate (TOPROL XL) 25 MG 24 hr tablet Take 0.5 tablets (12.5 mg total) by mouth daily. 45 tablet 3  ? ondansetron (ZOFRAN) 4 MG tablet Take 1 tablet (4 mg total) by mouth every 8 (eight) hours as needed for nausea or vomiting. 20 tablet 0  ? Potassium Gluconate 550 MG TABS Take 550 mg by mouth at bedtime.     ? promethazine (PHENERGAN) 12.5 MG tablet TAKE 1 TABLET(12.5 MG) BY MOUTH EVERY 6 HOURS AS NEEDED FOR NAUSEA OR VOMITING 30 tablet 3  ? simvastatin (ZOCOR) 40 MG tablet TAKE 1 TABLET(40 MG) BY MOUTH AT BEDTIME 90 tablet 1  ? ?No current facility-administered  medications for this encounter.  ? ? ?Allergies  ?Allergen Reactions  ? Clindamycin/Lincomycin Rash  ? Doxycycline Other (See Comments)  ?  Makes heart race  ? Keflex [Cephalexin] Nausea And Vomiting  ? Other   ? Sulfonic Acid (3,5-Dibromo-4-H-Ox-Benz)   ? Adhesive [Tape] Rash  ? Latex Rash  ? Sulfonamide Derivatives Nausea And Vomiting  ? ? ?Social History  ? ?Socioeconomic History  ? Marital status: Married  ?  Spouse name: Not on file  ? Number of children: Not on file  ? Years of education: Not on file  ? Highest education level: Not on file  ?Occupational History  ? Occupation: Clinical biochemist  ?  Employer: RETIRED  ?  Comment: Full time  ?Tobacco  Use  ? Smoking status: Former  ?  Years: 34.00  ?  Types: Cigarettes  ?  Quit date: 01/02/1994  ?  Years since quitting: 27.8  ? Smokeless tobacco: Never  ?Vaping Use  ? Vaping Use: Never used  ?Substance and Sexual Activity  ? Alcohol use: No  ? Drug use: No  ? Sexual activity: Not on file  ?Other Topics Concern  ? Not on file  ?Social History Narrative  ? Lives with spouse in [place]  ? ?Social Determinants of Health  ? ?Financial Resource Strain: Low Risk   ? Difficulty of Paying Living Expenses: Not hard at all  ?Food Insecurity: No Food Insecurity  ? Worried About Charity fundraiser in the Last Year: Never true  ? Ran Out of Food in the Last Year: Never true  ?Transportation Needs: No Transportation Needs  ? Lack of Transportation (Medical): No  ? Lack of Transportation (Non-Medical): No  ?Physical Activity: Inactive  ? Days of Exercise per Week: 0 days  ? Minutes of Exercise per Session: 0 min  ?Stress: No Stress Concern Present  ? Feeling of Stress : Not at all  ?Social Connections: Moderately Integrated  ? Frequency of Communication with Friends and Family: Three times a week  ? Frequency of Social Gatherings with Friends and Family: Once a week  ? Attends Religious Services: More than 4 times per year  ? Active Member of Clubs or Organizations: No  ?  Attends Archivist Meetings: Never  ? Marital Status: Married  ?Intimate Partner Violence: Not At Risk  ? Fear of Current or Ex-Partner: No  ? Emotionally Abused: No  ? Physically Abused: No  ? Sex

## 2021-11-14 ENCOUNTER — Telehealth: Payer: Self-pay | Admitting: Pharmacist

## 2021-11-14 NOTE — Progress Notes (Signed)
Chronic Care Management Pharmacy Assistant   Name: Erika Lee  MRN: [id number] DOB: [birth year]   Reason for Encounter: Disease State - General Adherence Call     Recent office visits:  10/25/21 Erika Luo MD - Family Medicine - Diabetes - Labs were ordered. Consider treating with Lasix 20 mg daily  for CHF and possible Jardiance depending on results. Follow up as scheduled.   09/06/21 Erika Luo MD - Family Medicine - Dysuria - Labs were ordered. chlorhexidine (HIBICLENS) 4 % external liquid, ciprofloxacin (CIPRO) 500 MG tablet, and ondansetron (ZOFRAN) 4 MG tablet ordered. Follow up if no improvement.  08/27/21 Erika Luo MD - Family Medicine - Dysuria - Labs were ordered.sulfamethoxazole-trimethoprim (BACTRIM DS) 800-160 MG tablet prescribed. Follow up if no improvement.   08/07/21 Erika Luo MD - Family Medicine - Chronic fatigue - No medication changes. Monitor. Follow up as scheduled.   07/26/21 Erika Luo MD - Family Medicine - Dyspnea - Labs were ordered. ECHO ordered. Consider Cardiology consult for stress test. Follow up as scheduled.   06/01/21 Erika Luo MD - Family Medicine - Nausea - Labs were ordered. Azelastine (ASTELIN) 0.1 % nasal spray and Ondansetron (ZOFRAN) 4 MG tablet prescribed. Stop Phenergan which she takes once a night due to her history of chronic nausea and replace it with Zofran 4 mg every 8 hours as needed. Follow up as scheduled.    Recent consult visits:  06/29/21 Erika Axe MD - Cardiology - Afib - EKG performed. amLODipine (NORVASC) 5 MG tablet and metoprolol succinate (TOPROL XL) 25 MG 24 hr tablet prescribed. Resume her hydrochlorothiazide at 25 mg but only every other day. Follow up with PCP as scheduled in January.   Hospital visits: 07/23/21 - 07/24/21 Medication Reconciliation was completed by comparing discharge summary, patient's EMR and Pharmacy list, and upon discussion with patient.  Admitted to the hospital on 07/23/21  due to Precordial Pain. Discharge date was 07/24/21. Discharged from [place]?Medications Started at [facility] Discharge:?? None noted.  Medication Changes at Hospital Discharge: None noted.   Medications Discontinued at Hospital Discharge: None noted.  Medications that remain the same after Hospital Discharge:??  All other medications will remain the same.    Medications: Outpatient Encounter Medications as of 11/14/2021  Medication Sig   amLODipine (NORVASC) 5 MG tablet Take 0.5 tablets (2.5 mg total) by mouth daily.   azelastine (ASTELIN) 0.1 % nasal spray Place 2 sprays into both nostrils 2 (two) times daily. Use in each nostril as directed   diclofenac Sodium (VOLTAREN) 1 % GEL Apply 2 g topically 4 (four) times daily.   ELIQUIS 5 MG TABS tablet TAKE 1 TABLET(5 MG) BY MOUTH TWICE DAILY   esomeprazole (NEXIUM) 20 MG capsule Take 20 mg by mouth daily.    furosemide (LASIX) 20 MG tablet Take 1 tablet (20 mg total) by mouth daily.   glucose blood (FREESTYLE PRECISION NEO TEST) test strip DX: E11.9 Use to check blood sugar daily   HYDROcodone-acetaminophen (NORCO) 10-325 MG tablet Take 1 tablet by mouth every 6 (six) hours as needed.   Lancets (FREESTYLE) lancets USE TO CHECK BLOOD SUGAR DAILY   lisinopril (ZESTRIL) 40 MG tablet TAKE 1 TABLET(40 MG) BY MOUTH DAILY   metFORMIN (GLUCOPHAGE) 500 MG tablet TAKE 1 TABLET(500 MG) BY MOUTH DAILY   metoprolol succinate (TOPROL XL) 25 MG 24 hr tablet Take 0.5 tablets (12.5 mg total) by mouth daily.   ondansetron (ZOFRAN) 4 MG tablet Take  1 tablet (4 mg total) by mouth every 8 (eight) hours as needed for nausea or vomiting.   Potassium Gluconate 550 MG TABS Take 550 mg by mouth at bedtime.    promethazine (PHENERGAN) 12.5 MG tablet TAKE 1 TABLET(12.5 MG) BY MOUTH EVERY 6 HOURS AS NEEDED FOR NAUSEA OR VOMITING   simvastatin (ZOCOR) 40 MG tablet TAKE 1 TABLET(40 MG) BY MOUTH AT BEDTIME   No facility-administered encounter  medications on file as of 11/14/2021.    Contacted Erika Lee for General Review Call   Chart Review:  Have there been any documented new, changed, or discontinued medications since last visit? Yes (If yes, include name, dose, frequency, date) see above Has there been any documented recent hospitalizations or ED visits since last visit with Clinical Pharmacist? Yes Brief Summary (including medication and/or Diagnosis changes): Erika Lee 07/23/21-07/24/21 Precordial pain.    Adherence Review:  Does the Clinical Pharmacist Assistant have access to adherence rates? Yes Adherence rates for STAR metric medications (List medication(s)/day supply/ last 2 fill dates). Adherence rates for medications indicated for disease state being reviewed (List medication(s)/day supply/ last 2 fill dates). Does the patient have >5 day gap between last estimated fill dates for any of the above medications or other medication gaps? No Reason for medication gaps.   Disease State Questions:  Able to connect with Patient? No Did patient have any problems with their health recently?  Note problems and Concerns: Have you had any admissions or emergency room visits or worsening of your condition(s) since last visit?  Details of ED visit, hospital visit and/or worsening condition(s): Have you had any visits with new specialists or providers since your last visit? Yes Explain: Cardiology consult 06/29/22 Have you had any new health care problem(s) since your last visit?  New problem(s) reported: Have you run out of any of your medications since you last spoke with clinical pharmacist?  What caused you to run out of your medications? Are there any medications you are not taking as prescribed?  What kept you from taking your medications as prescribed? Are you having any issues or side effects with your medications?  Note of issues or side effects: Do you have any other health concerns or questions you want to  discuss with your Clinical Pharmacist before your next visit?  Note additional concerns and questions from Patient. Are there any health concerns that you feel we can do a better job addressing?  Note Patient's response. Are you having any problems with any of the following since the last visit: (select all that apply)    Details: 12. Any falls since last visit?   Details: 13. Any increased or uncontrolled pain since last visit?   Details: 14. Next visit Type: office       Visit with: [facility] PCP         Date: 11/16/21        Time: 11:30 am  15. Additional Details?     Care Gaps  AWV: due  Colonoscopy: done 09/10/11 DM Eye Exam: unknown  DM Foot Exam: due 01/22/22 Microalbumin: done 07/24/20 HbgAIC: done 10/25/21 (6.2) DEXA: done 11/30/12 ordered Mammogram: done 06/14/13   Star Rating Drugs: simvastatin (ZOCOR) 40 MG tablet - last filled 10/22/21 90 days  metFORMIN (GLUCOPHAGE) 500 MG tablet - last filled 10/30/21 90 days  lisinopril (ZESTRIL) 40 MG tablet - last filled 10/10/21 90 days   Future Appointments  Date Time Provider [place]  11/16/2021 11:30 AM [name],  MD [facility]  02/18/2022  2:00 PM Allred, Jeneen Rinks, MD [facility]   Multiple attempts were made to contact patient. Attempts were unsuccessful. / ls,CMA   Jobe Gibbon, [place] Pharmacist Assistant  [phone]

## 2021-11-15 ENCOUNTER — Other Ambulatory Visit: Payer: Self-pay | Admitting: Family Medicine

## 2021-11-15 NOTE — Telephone Encounter (Signed)
Requested Prescriptions  Pending Prescriptions Disp Refills  . metoprolol succinate (TOPROL-XL) 25 MG 24 hr tablet [Pharmacy Med Name: METOPROLOL ER SUCCINATE '25MG'$  TABS] 45 tablet 0    Sig: TAKE 1/2 TABLET(12.5 MG) BY MOUTH DAILY     Cardiovascular:  Beta Blockers Failed - 11/15/2021  3:34 AM      Failed - Last BP in normal range    BP Readings from Last 1 Encounters:  11/13/21 (!) 148/70         Passed - Last Heart Rate in normal range    Pulse Readings from Last 1 Encounters:  11/13/21 (!) 50         Passed - Valid encounter within last 6 months    Recent Outpatient Visits          3 weeks ago Controlled type 2 diabetes mellitus with complication, without long-term current use of insulin ([place])   [place] Pickard, Cammie Mcgee, MD   2 months ago [address] [place] [address] [name], MD   2 months ago [place] [name], MD   3 months ago Chronic fatigue   [place] Schaumann, Cammie Mcgee, MD   3 months ago Dyspnea, unspecified type   [place] Pickard, Cammie Mcgee, MD      Future Appointments            Tomorrow Pickard, Cammie Mcgee, MD [place], [place]

## 2021-11-16 ENCOUNTER — Ambulatory Visit (INDEPENDENT_AMBULATORY_CARE_PROVIDER_SITE_OTHER): Payer: PPO | Admitting: Family Medicine

## 2021-11-19 NOTE — Progress Notes (Signed)
error 

## 2021-11-20 ENCOUNTER — Telehealth: Payer: Self-pay

## 2021-11-20 ENCOUNTER — Other Ambulatory Visit: Payer: Self-pay | Admitting: Family Medicine

## 2021-11-20 MED ORDER — AZITHROMYCIN 250 MG PO TABS: ORAL_TABLET | ORAL | 0 refills | Status: DC

## 2021-11-20 NOTE — Telephone Encounter (Signed)
Message Received: Today [name], MD sent to Colman Cater, CMA Please call patient and verify that Rolan Lipa has come home.  If he has, please schedule him for ov and block out from 2-3 on Friday so I can drive to his home and see him.     5/23 pt call back and pickard already taken care off. Nothing at this time. Per pt

## 2021-12-08 ENCOUNTER — Other Ambulatory Visit: Payer: Self-pay | Admitting: Family Medicine

## 2021-12-10 NOTE — Telephone Encounter (Signed)
Requested medication (s) are due for refill today: Yes  Requested medication (s) are on the active medication list: Yes  Last refill:  08/13/21  Future visit scheduled: No  Notes to clinic:  Not delegated.    Requested Prescriptions  Pending Prescriptions Disp Refills   promethazine (PHENERGAN) 12.5 MG tablet [Pharmacy Med Name: PROMETHAZINE 12.'5MG'$  TABLETS] 30 tablet 3    Sig: TAKE 1 TABLET(12.5 MG) BY MOUTH EVERY 6 HOURS AS NEEDED FOR NAUSEA OR VOMITING     Not Delegated - Gastroenterology: Antiemetics Failed - 12/10/2021  9:06 AM      Failed - This refill cannot be delegated      Passed - Valid encounter within last 6 months    Recent Outpatient Visits           3 weeks ago Erroneous encounter - disregard   [place] [name], MD   1 month ago Controlled type 2 diabetes mellitus with complication, without long-term current use of insulin ([place])   [place] Pickard, Cammie Mcgee, MD   3 months ago [place] [name], MD   3 months ago [place] Schaumann, Cammie Mcgee, MD   4 months ago Chronic fatigue   [place] Pickard, Cammie Mcgee, MD

## 2021-12-11 ENCOUNTER — Other Ambulatory Visit: Payer: Self-pay | Admitting: Family Medicine

## 2021-12-11 ENCOUNTER — Telehealth: Payer: Self-pay

## 2021-12-11 MED ORDER — HYDROCODONE-ACETAMINOPHEN 10-325 MG PO TABS: 1.0000 | ORAL_TABLET | Freq: Four times a day (QID) | ORAL | 0 refills | Status: DC | PRN

## 2021-12-11 NOTE — Telephone Encounter (Signed)
Pt called in to request for a refill of HYDROcodone-acetaminophen (NORCO) 10-325 MG tablet [[id number]] to be sent to [facility] on [facility]. Please advise.  Cb#: [phone]

## 2021-12-11 NOTE — Telephone Encounter (Signed)
Pt is requesting a refill. 

## 2021-12-12 NOTE — Telephone Encounter (Signed)
LOV 11/16/21 Last refill 08/13/21, #30, 3 refills  Please review, thanks!

## 2021-12-19 ENCOUNTER — Telehealth: Payer: Self-pay | Admitting: Pharmacist

## 2021-12-19 NOTE — Progress Notes (Signed)
Chronic Care Management Pharmacy Assistant   Name: Erika Lee  MRN: [id number] DOB: 01-30-1946   Reason for Encounter: Disease State - General Adherence Call     Recent office visits:  10/25/21 [name] MD - Family Medicine - Diabetes - Labs were ordered. Consider treating with Lasix 20 mg daily  for CHF and possible Jardiance depending on results. Follow up as scheduled.    09/06/21 [name] MD - Family Medicine - Dysuria - Labs were ordered. chlorhexidine (HIBICLENS) 4 % external liquid, ciprofloxacin (CIPRO) 500 MG tablet, and ondansetron (ZOFRAN) 4 MG tablet ordered. Follow up if no improvement.   08/27/21 [name] MD - Family Medicine - Dysuria - Labs were ordered.sulfamethoxazole-trimethoprim (BACTRIM DS) 800-160 MG tablet prescribed. Follow up if no improvement.    08/07/21 [name] MD - Family Medicine - Chronic fatigue - No medication changes. Monitor. Follow up as scheduled.    07/26/21 [name] MD - Family Medicine - Dyspnea - Labs were ordered. ECHO ordered. Consider Cardiology consult for stress test. Follow up as scheduled.    06/01/21 [name] MD - Family Medicine - Nausea - Labs were ordered. Azelastine (ASTELIN) 0.1 % nasal spray and Ondansetron (ZOFRAN) 4 MG tablet prescribed. Stop Phenergan which she takes once a night due to her history of chronic nausea and replace it with Zofran 4 mg every 8 hours as needed. Follow up as scheduled.     Recent consult visits:  06/29/21 Virl Axe MD - Cardiology - Afib - EKG performed. amLODipine (NORVASC) 5 MG tablet and metoprolol succinate (TOPROL XL) 25 MG 24 hr tablet prescribed. Resume her hydrochlorothiazide at 25 mg but only every other day. Follow up with PCP as scheduled in January.  Hospital visits: 07/23/21 - 07/24/21 Medication Reconciliation was completed by comparing discharge summary, patient's EMR and Pharmacy list, and upon discussion with patient.   Admitted to the hospital on  07/23/21 due to Precordial Pain. Discharge date was 07/24/21. Discharged from [place]?Medications Started at [facility] Discharge:?? None noted.   Medication Changes at Hospital Discharge: None noted.    Medications Discontinued at Hospital Discharge: None noted.   Medications that remain the same after Hospital Discharge:??  All other medications will remain the same.    Medications: Outpatient Encounter Medications as of 12/19/2021  Medication Sig   amLODipine (NORVASC) 5 MG tablet Take 0.5 tablets (2.5 mg total) by mouth daily.   azelastine (ASTELIN) 0.1 % nasal spray Place 2 sprays into both nostrils 2 (two) times daily. Use in each nostril as directed   azithromycin (ZITHROMAX) 250 MG tablet 2 tabs poqday1, 1 tab poqday 2-5   diclofenac Sodium (VOLTAREN) 1 % GEL Apply 2 g topically 4 (four) times daily.   ELIQUIS 5 MG TABS tablet TAKE 1 TABLET(5 MG) BY MOUTH TWICE DAILY   esomeprazole (NEXIUM) 20 MG capsule Take 20 mg by mouth daily.    furosemide (LASIX) 20 MG tablet Take 1 tablet (20 mg total) by mouth daily.   glucose blood (FREESTYLE PRECISION NEO TEST) test strip DX: E11.9 Use to check blood sugar daily   HYDROcodone-acetaminophen (NORCO) 10-325 MG tablet Take 1 tablet by mouth every 6 (six) hours as needed.   Lancets (FREESTYLE) lancets USE TO CHECK BLOOD SUGAR DAILY   lisinopril (ZESTRIL) 40 MG tablet TAKE 1 TABLET(40 MG) BY MOUTH DAILY   metFORMIN (GLUCOPHAGE) 500 MG tablet TAKE 1 TABLET(500 MG) BY MOUTH DAILY   metoprolol succinate (  TOPROL-XL) 25 MG 24 hr tablet TAKE 1/2 TABLET(12.5 MG) BY MOUTH DAILY   ondansetron (ZOFRAN) 4 MG tablet Take 1 tablet (4 mg total) by mouth every 8 (eight) hours as needed for nausea or vomiting.   Potassium Gluconate 550 MG TABS Take 550 mg by mouth at bedtime.    promethazine (PHENERGAN) 12.5 MG tablet TAKE 1 TABLET(12.5 MG) BY MOUTH EVERY 6 HOURS AS NEEDED FOR NAUSEA OR VOMITING   simvastatin (ZOCOR) 40 MG tablet TAKE 1  TABLET(40 MG) BY MOUTH AT BEDTIME   No facility-administered encounter medications on file as of 12/19/2021.   Contacted [name] for General Review Call     Chart Review:   Have there been any documented new, changed, or discontinued medications since last visit? Yes (If yes, include name, dose, frequency, date) see above Has there been any documented recent hospitalizations or ED visits since last visit with Clinical Pharmacist? Yes Brief Summary (including medication and/or Diagnosis changes): [name] 07/23/21-07/24/21 Precordial pain.      Adherence Review:   Does the Clinical Pharmacist Assistant have access to adherence rates? Yes Adherence rates for STAR metric medications (List medication(s)/day supply/ last 2 fill dates). Adherence rates for medications indicated for disease state being reviewed (List medication(s)/day supply/ last 2 fill dates). Does the patient have >5 day gap between last estimated fill dates for any of the above medications or other medication gaps? No Reason for medication gaps.     Disease State Questions:   Able to connect with Patient? Yes Did patient have any problems with their health recently?  No Note problems and Concerns: None Have you had any admissions or emergency room visits or worsening of your condition(s) since last visit? No Details of ED visit, hospital visit and/or worsening condition(s): see above Have you had any visits with new specialists or providers since your last visit? Yes Explain: Cardiology consult 06/29/22 Have you had any new health care problem(s) since your last visit? No New problem(s) reported: Have you run out of any of your medications since you last spoke with clinical pharmacist? No What caused you to run out of your medications? Are there any medications you are not taking as prescribed?  What kept you from taking your medications as prescribed? No Are you having any issues or side effects with your  medications?  No Note of issues or side effects: Do you have any other health concerns or questions you want to discuss with your Clinical Pharmacist before your next visit?  No Note additional concerns and questions from Patient. Are there any health concerns that you feel we can do a better job addressing?  No Note Patient's response. Are you having any problems with any of the following since the last visit: (select all that apply) None               Details: 12. Any falls since last visit? No             Details: 13. Any increased or uncontrolled pain since last visit? No             Details:  14. Next visit Type: office       Visit with: Thompson Grayer, MD - Cardiology          Date: 02/18/22        Time: 2:00 pm   15. Additional Details? No          Care Gaps   AWV: due  Colonoscopy: done 09/10/11 DM Eye Exam: unknown  DM Foot Exam: due 01/22/22 Microalbumin: done 07/24/20 HbgAIC: done 10/25/21 (6.2) DEXA: done 11/30/12 ordered Mammogram: done 06/14/13     Star Rating Drugs: Simvastatin (ZOCOR) 40 MG tablet - last filled 10/22/21 90 days  Metformin (GLUCOPHAGE) 500 MG tablet - last filled 10/30/21 90 days  Lisinopril (ZESTRIL) 40 MG tablet - last filled 10/10/21 90 days    Future Appointments  Date Time Provider [place]  02/18/2022  2:00 PM Allred, Jeneen Rinks, MD [facility]     Jobe Gibbon, [facility] Clinical Pharmacist Assistant  [phone]

## 2022-01-07 DIAGNOSIS — M9902 Segmental and somatic dysfunction of thoracic region: Secondary | ICD-10-CM | POA: Diagnosis not present

## 2022-01-07 DIAGNOSIS — M62830 Muscle spasm of back: Secondary | ICD-10-CM | POA: Diagnosis not present

## 2022-01-07 DIAGNOSIS — M9903 Segmental and somatic dysfunction of lumbar region: Secondary | ICD-10-CM | POA: Diagnosis not present

## 2022-01-07 DIAGNOSIS — M9901 Segmental and somatic dysfunction of cervical region: Secondary | ICD-10-CM | POA: Diagnosis not present

## 2022-01-07 DIAGNOSIS — M542 Cervicalgia: Secondary | ICD-10-CM | POA: Diagnosis not present

## 2022-01-07 DIAGNOSIS — M546 Pain in thoracic spine: Secondary | ICD-10-CM | POA: Diagnosis not present

## 2022-01-11 ENCOUNTER — Other Ambulatory Visit: Payer: Self-pay | Admitting: Family Medicine

## 2022-01-11 ENCOUNTER — Telehealth: Payer: Self-pay

## 2022-01-11 ENCOUNTER — Other Ambulatory Visit: Payer: PPO

## 2022-01-11 ENCOUNTER — Other Ambulatory Visit: Payer: Self-pay

## 2022-01-11 DIAGNOSIS — N390 Urinary tract infection, site not specified: Secondary | ICD-10-CM

## 2022-01-11 LAB — URINALYSIS, ROUTINE W REFLEX MICROSCOPIC
Bacteria, UA: NONE SEEN /HPF
Bilirubin Urine: NEGATIVE
Glucose, UA: NEGATIVE
Hgb urine dipstick: NEGATIVE
Hyaline Cast: NONE SEEN /LPF
Ketones, ur: NEGATIVE
Nitrite: NEGATIVE
Protein, ur: NEGATIVE
RBC / HPF: NONE SEEN /HPF (ref 0–2)
Specific Gravity, Urine: 1.01 (ref 1.001–1.035)
pH: 7 (ref 5.0–8.0)

## 2022-01-11 LAB — MICROSCOPIC MESSAGE

## 2022-01-11 MED ORDER — HYDROCODONE-ACETAMINOPHEN 10-325 MG PO TABS: 1.0000 | ORAL_TABLET | Freq: Four times a day (QID) | ORAL | 0 refills | Status: DC | PRN

## 2022-01-11 NOTE — Telephone Encounter (Signed)
Pt came in to leave urine sample, will call as soon as we get results.

## 2022-01-11 NOTE — Telephone Encounter (Signed)
Pt came in to office to request refill of HYDROcodone-acetaminophen (NORCO) 10-325 MG tablet    LOV: 11/16/21

## 2022-01-11 NOTE — Telephone Encounter (Signed)
Pt came in stating she may have a possible UTI. Pt was able to leave a urine sample to be tested. Please advise.  Cb#: [phone]

## 2022-01-13 ENCOUNTER — Other Ambulatory Visit: Payer: Self-pay | Admitting: Family Medicine

## 2022-01-15 NOTE — Telephone Encounter (Signed)
last RF 10/04/21 #60 3 RF (should have enough until August 6)  Requested Prescriptions  Refused Prescriptions Disp Refills  . ELIQUIS 5 MG TABS tablet [Pharmacy Med Name: ELIQUIS '5MG'$  TABLETS] 60 tablet 3    Sig: TAKE 1 TABLET(5 MG) BY MOUTH TWICE DAILY     Hematology:  Anticoagulants - apixaban Passed - 01/13/2022  8:04 AM      Passed - PLT in normal range and within 360 days    Platelets  Date Value Ref Range Status  10/25/2021 207 140 - 400 Thousand/uL Final         Passed - HGB in normal range and within 360 days    Hemoglobin  Date Value Ref Range Status  10/25/2021 13.7 11.7 - 15.5 g/dL Final         Passed - HCT in normal range and within 360 days    HCT  Date Value Ref Range Status  10/25/2021 41.1 35.0 - 45.0 % Final         Passed - Cr in normal range and within 360 days    Creat  Date Value Ref Range Status  10/25/2021 0.75 0.60 - 1.00 mg/dL Final         Passed - AST in normal range and within 360 days    AST  Date Value Ref Range Status  10/25/2021 13 10 - 35 U/L Final         Passed - ALT in normal range and within 360 days    ALT  Date Value Ref Range Status  10/25/2021 10 6 - 29 U/L Final         Passed - Valid encounter within last 12 months    Recent Outpatient Visits          2 months ago Erroneous encounter - disregard   [place] [name], MD   2 months ago Controlled type 2 diabetes mellitus with complication, without long-term current use of insulin ([place])   [place] Pickard, Cammie Mcgee, MD   4 months ago [place] Denn[place] Schaumann, Cammie Mcgee, MD   4 months ago [place], Cammie Mcgee, MD   5 months ago Chronic fatigue   [place] Pickard, Cammie Mcgee, MD

## 2022-01-17 ENCOUNTER — Ambulatory Visit (HOSPITAL_BASED_OUTPATIENT_CLINIC_OR_DEPARTMENT_OTHER): Payer: PPO | Admitting: Internal Medicine

## 2022-01-17 ENCOUNTER — Encounter (HOSPITAL_BASED_OUTPATIENT_CLINIC_OR_DEPARTMENT_OTHER): Payer: Self-pay | Admitting: Internal Medicine

## 2022-01-17 VITALS — BP 140/60 | HR 58 | Ht 65.0 in | Wt 157.0 lb

## 2022-01-17 DIAGNOSIS — I10 Essential (primary) hypertension: Secondary | ICD-10-CM | POA: Diagnosis not present

## 2022-01-17 DIAGNOSIS — I4819 Other persistent atrial fibrillation: Secondary | ICD-10-CM | POA: Insufficient documentation

## 2022-01-17 DIAGNOSIS — I4892 Unspecified atrial flutter: Secondary | ICD-10-CM | POA: Diagnosis not present

## 2022-01-17 NOTE — Progress Notes (Signed)
PCP: Erika Frizzle, MD   Primary EP: Dr Erika Lee (most recently has seen Dr Erika Lee)  Erika [place] Erika Lee is a 76 y.o. female who presents today for routine electrophysiology followup.  Since last being seen in our clinic, the patient reports doing very well.  She has rare fatigue and SOB.  Prior workup has been unrevealing.  She is spending a lot of time caring for her husband who had a stroke in April and has been mostly in a nursing home since that time.  He recently was able to go home.  Today, she denies symptoms of palpitations, chest pain,  lower extremity edema, dizziness, presyncope, or syncope.  The patient is otherwise without complaint today.   Past Medical History:  Diagnosis Date   Anticoagulant long-term use    eliquis   Chronic sinusitis    GERD (gastroesophageal reflux disease)    Hemorrhoids    History of colitis 10/2016   campylobacter   Hyperlipidemia    Hypertension    Multinodular goiter    per pt has had 2 biopsy's both benign   NSTEMI (non-ST elevated myocardial infarction) ([place])    12/2018- normal coronary arteries on cath, poss coronary vasospasm.    Osteoarthritis    "all over"  and [facility] joint left shoulder   Paroxysmal atrial fibrillation [facility]) EP cardiologist--  dr Erika Lee   s/p PVI by Dr Erika Lee 07/16/2013;   pt first dx 2008,  recurrence 2014 afib/flutter and tachy-brady   Rotator cuff tear, left    Shoulder impingement, left    Status post placement of implantable loop recorder    original placement 07-26-2014;  removal / replacement 10-17-2017  by dr    Type 2 diabetes mellitus ([place])    followed by pcp   Past Surgical History:  Procedure Laterality Date   ATRIAL FIBRILLATION ABLATION N/A 07/16/2013   PVI and CTI ablation by Dr Erika Lee   CARDIAC CATHETERIZATION     01/11/2019- Normal coronary arteries.    COLONOSCOPY  09/10/2011   Dr. Arnoldo Lee: normal, 10 year follow up.   implantable loop recorder removal  04/25/2021   MDT LINQ removed by Dr  Erika Lee   LEFT HEART CATH AND CORONARY ANGIOGRAPHY N/A 01/11/2019   Procedure: LEFT HEART CATH AND CORONARY ANGIOGRAPHY;  Surgeon: Lorretta Harp, MD;  Location: [facility] CV LAB;  Service: Cardiovascular;  Laterality: N/A;   LESION REMOVAL N/A 05/03/2013   Procedure: EXCISION CYST, BACK;  Surgeon: Erika So, MD;  Location: [facility];  Service: General;  Laterality: N/A;   LOOP RECORDER IMPLANT N/A 07/26/2014   Procedure: LOOP RECORDER IMPLANT;  Surgeon: Erika Grayer, MD;  Location: [facility] CATH LAB;  Service: Cardiovascular;  Laterality: N/A;   LOOP RECORDER INSERTION N/A 10/17/2017   MDT previously implanted ILR for afib management at [facility].  old device removed and new device placed by Dr Erika Lee   LOOP RECORDER REMOVAL N/A 10/17/2017   MDT ILR removed with new device subsequently replaced   SHOULDER ARTHROSCOPY WITH ROTATOR CUFF REPAIR AND SUBACROMIAL DECOMPRESSION Left 01/07/2019   Procedure: Left shoulder subacromial decompression, distal clavicle resection, extensive debridement,;  Surgeon: Nicholes Stairs, MD;  Location: [facility];  Service: Orthopedics;  Laterality: Left;   TEE WITHOUT CARDIOVERSION N/A 07/15/2013   Procedure: TRANSESOPHAGEAL ECHOCARDIOGRAM (TEE);  Surgeon: Erika Perla, MD;  [facility] ENDOSCOPY;  Service: Cardiovascular;  Laterality: N/A;   TONSILLECTOMY  age 35   TRANSANAL HEMORRHOIDAL DEARTERIALIZATION N/A 09/22/2015  Procedure: TRANSANAL HEMORRHOIDAL LIGATION/PEXY EUA POSSIBLE HEMORRHOIDECTOMY ;  Surgeon: Erika Boston, MD;  Location: [facility];  Service: General;  Laterality: N/A;    ROS- all systems are reviewed and negatives except as per HPI above  Current Outpatient Medications  Medication Sig Dispense Refill   amLODipine (NORVASC) 5 MG tablet Take 0.5 tablets (2.5 mg total) by mouth daily. 45 tablet 2   diclofenac Sodium (VOLTAREN) 1 % GEL Apply 2 g topically 4 (four) times daily. 100 g 2   ELIQUIS 5 MG TABS tablet TAKE 1 TABLET(5  MG) BY MOUTH TWICE DAILY 60 tablet 3   esomeprazole (NEXIUM) 20 MG capsule Take 20 mg by mouth daily.      furosemide (LASIX) 20 MG tablet Take 1 tablet (20 mg total) by mouth daily. 30 tablet 1   glucose blood (FREESTYLE PRECISION NEO TEST) test strip DX: E11.9 Use to check blood sugar daily 50 strip 11   HYDROcodone-acetaminophen (NORCO) 10-325 MG tablet Take 1 tablet by mouth every 6 (six) hours as needed. 120 tablet 0   Lancets (FREESTYLE) lancets USE TO CHECK BLOOD SUGAR DAILY 100 each 2   lisinopril (ZESTRIL) 40 MG tablet TAKE 1 TABLET(40 MG) BY MOUTH DAILY 90 tablet 3   metFORMIN (GLUCOPHAGE) 500 MG tablet TAKE 1 TABLET(500 MG) BY MOUTH DAILY 90 tablet 3   metoprolol succinate (TOPROL-XL) 25 MG 24 hr tablet TAKE 1/2 TABLET(12.5 MG) BY MOUTH DAILY 45 tablet 1   ondansetron (ZOFRAN) 4 MG tablet Take 1 tablet (4 mg total) by mouth every 8 (eight) hours as needed for nausea or vomiting. 20 tablet 0   Potassium Gluconate 550 MG TABS Take 550 mg by mouth at bedtime.      promethazine (PHENERGAN) 12.5 MG tablet TAKE 1 TABLET(12.5 MG) BY MOUTH EVERY 6 HOURS AS NEEDED FOR NAUSEA OR VOMITING 30 tablet 3   simvastatin (ZOCOR) 40 MG tablet TAKE 1 TABLET(40 MG) BY MOUTH AT BEDTIME 90 tablet 1   No current facility-administered medications for this visit.    Physical Exam: Vitals:   01/17/22 1354  BP: 140/60  Pulse: (!) 58  SpO2: 96%  Weight: 157 lb (71.2 kg)  Height: '5\' 5"'$  (1.651 m)    GEN- The patient is well appearing, alert and oriented x 3 today.   Head- normocephalic, atraumatic Eyes-  Sclera clear, conjunctiva pink Ears- hearing intact Oropharynx- clear Lungs- Clear to ausculation bilaterally, normal work of breathing Heart- Regular rate and rhythm, no murmurs, rubs or gallops, PMI not laterally displaced GI- soft, NT, ND, + BS Extremities- no clubbing, cyanosis, or edema  Wt Readings from Last 3 Encounters:  01/17/22 157 lb (71.2 kg)  11/13/21 157 lb 12.8 oz (71.6 kg)   10/25/21 158 lb (71.7 kg)   Echo 08/02/21- EF 60%  EKG tracing ordered today is personally reviewed and shows sinus bradycardia 58 bpm, PACs, incomplete rbbb  Assessment and Plan:  Persistent afib/ atrial flutter Well controlled She is on eliquis for chads2vasc score of 5 Stop toprol due to occasional fatigue and SOB  2. HTN Stable No change required today  3. HL Stable No change required today  Prior cath and echo reviewed  Return to see EP APP in 6 months  Erika Grayer MD, [facility] 01/17/2022 2:10 PM

## 2022-01-17 NOTE — Patient Instructions (Addendum)
Medication Instructions:  Your physician has recommended you make the following change in your medication:   1.   STOP TAKING YOUR TOPROL-XL 25 mg tablet.    Lab Work: None ordered. If you have labs (blood work) drawn today and your tests are completely normal, you will receive your results only by: [place] (if you have MyChart) OR A paper copy in the mail If you have any lab test that is abnormal or we need to change your treatment, we will call you to review the results.  Testing/Procedures: None ordered.  Follow-Up: At [facility], you and your health needs are our priority.  As part of our continuing mission to provide you with exceptional heart care, we have created designated Provider Care Teams.  These Care Teams include your primary Cardiologist (physician) and Advanced Practice Providers (APPs -  Physician Assistants and Nurse Practitioners) who all work together to provide you with the care you need, when you need it.  Your next appointment:   Your physician wants you to follow-up in: 6 Months follow up Tommye Standard, PA-C  You will receive a reminder letter in the mail two months in advance. If you don't receive a letter, please call our office to schedule the follow-up appointment.   Important Information About [place]

## 2022-02-02 ENCOUNTER — Other Ambulatory Visit: Payer: Self-pay | Admitting: Family Medicine

## 2022-02-04 NOTE — Telephone Encounter (Signed)
Requested Prescriptions  Pending Prescriptions Disp Refills  . metFORMIN (GLUCOPHAGE) 500 MG tablet [Pharmacy Med Name: METFORMIN 500MG TABLETS] 90 tablet 0    Sig: TAKE 1 TABLET(500 MG) BY MOUTH DAILY     Endocrinology:  Diabetes - Biguanides Passed - 02/02/2022  8:05 AM      Passed - Cr in normal range and within 360 days    Creat  Date Value Ref Range Status  10/25/2021 0.75 0.60 - 1.00 mg/dL Final         Passed - HBA1C is between 0 and 7.9 and within 180 days    Hgb A1c MFr Bld  Date Value Ref Range Status  10/25/2021 6.2 (H) <5.7 % of total Hgb Final    Comment:    For someone without known diabetes, a hemoglobin  A1c value between 5.7% and 6.4% is consistent with prediabetes and should be confirmed with a  follow-up test. . For someone with known diabetes, a value <7% indicates that their diabetes is well controlled. A1c targets should be individualized based on duration of diabetes, age, comorbid conditions, and other considerations. . This assay result is consistent with an increased risk of diabetes. . Currently, no consensus exists regarding use of hemoglobin A1c for diagnosis of diabetes for children. .          Passed - eGFR in normal range and within 360 days    GFR, Est African American  Date Value Ref Range Status  10/23/2020 78 > OR = 60 mL/min/1.102m Final   GFR, Est Non African American  Date Value Ref Range Status  10/23/2020 68 > OR = 60 mL/min/1.738mFinal   GFR, Estimated  Date Value Ref Range Status  07/23/2021 >60 >60 mL/min Final    Comment:    (NOTE) Calculated using the CKD-EPI Creatinine Equation (2021)    GFR  Date Value Ref Range Status  07/22/2013 72.30 >60.00 mL/min Final   eGFR  Date Value Ref Range Status  10/25/2021 82 > OR = 60 mL/min/1.7334minal    Comment:    The eGFR is based on the CKD-EPI 2021 equation. To calculate  the new eGFR from a previous Creatinine or Cystatin C result, go to  https://www.kidney.org/professionals/ kdoqi/gfr%5Fcalculator          Passed - B12 Level in normal range and within 720 days    Vitamin B-12  Date Value Ref Range Status  07/26/2021 270 200 - 1,100 pg/mL Final    Comment:    . Please Note: Although the reference range for vitamin B12 is [id number] pg/mL, it has been reported that between 5 and 10% of patients with values between 200 and 400 pg/mL may experience neuropsychiatric and hematologic abnormalities due to occult B12 deficiency; less than 1% of patients with values above 400 pg/mL will have symptoms. .  Renella CunasValid encounter within last 6 months    Recent Outpatient Visits          2 months ago Erroneous encounter - disregard   [place] FrizzleD   3 months ago Controlled type 2 diabetes mellitus with complication, without long-term current use of insulin ([place] Mcgee[place]   5 months ago Dys[place]ammie McgeeD   5 months ago Dys[place]arCammie McgeeD   6 months ago  Chronic fatigue   [place] Pickard, Cammie Mcgee, MD             Passed - CBC within normal limits and completed in the last 12 months    WBC  Date Value Ref Range Status  10/25/2021 6.0 3.8 - 10.8 Thousand/uL Final   RBC  Date Value Ref Range Status  10/25/2021 4.64 3.80 - 5.10 Million/uL Final   Hemoglobin  Date Value Ref Range Status  10/25/2021 13.7 11.7 - 15.5 g/dL Final   HCT  Date Value Ref Range Status  10/25/2021 41.1 35.0 - 45.0 % Final   MCHC  Date Value Ref Range Status  10/25/2021 33.3 32.0 - 36.0 g/dL Final   [facility]  Date Value Ref Range Status  10/25/2021 29.5 27.0 - 33.0 pg Final   MCV  Date Value Ref Range Status  10/25/2021 88.6 80.0 - 100.0 fL Final   No results found for: "PLTCOUNTKUC", "LABPLAT", "POCPLA" RDW  Date Value Ref Range Status   10/25/2021 12.5 11.0 - 15.0 % Final         . simvastatin (ZOCOR) 40 MG tablet [Pharmacy Med Name: SIMVASTATIN 40MG TABLETS] 90 tablet 0    Sig: TAKE 1 TABLET(40 MG) BY MOUTH AT BEDTIME     Cardiovascular:  Antilipid - Statins Failed - 02/02/2022  8:05 AM      Failed - Lipid Panel in normal range within the last 12 months    Cholesterol  Date Value Ref Range Status  10/25/2021 132 <200 mg/dL Final   LDL Cholesterol (Calc)  Date Value Ref Range Status  10/25/2021 59 mg/dL (calc) Final    Comment:    Reference range: <100 . Desirable range <100 mg/dL for primary prevention;   <70 mg/dL for patients with CHD or diabetic patients  with > or = 2 CHD risk factors. [place] Kitchen LDL-C is now calculated using the Martin-Hopkins  calculation, which is a validated novel method providing  better accuracy than the Friedewald equation in the  estimation of LDL-C.  Cresenciano Genre et al. Annamaria Helling. 8882;800(34): 2061-2068  (http://education.[employer].com/faq/FAQ164)    HDL  Date Value Ref Range Status  10/25/2021 53 > OR = 50 mg/dL Final   Triglycerides  Date Value Ref Range Status  10/25/2021 116 <150 mg/dL Final         Passed - Patient is not pregnant      Passed - Valid encounter within last 12 months    Recent Outpatient Visits          2 months ago Erroneous encounter - disregard   [place] [name], MD   3 months ago Controlled type 2 diabetes mellitus with complication, without long-term current use of insulin ([place])   [place] Pickard, Cammie Mcgee, MD   5 months ago [place] Dennard Schaumann, Cammie Mcgee, MD   5 months ago [place], Cammie Mcgee, MD   6 months ago Chronic fatigue   [place] Pickard, Cammie Mcgee, MD

## 2022-02-06 ENCOUNTER — Other Ambulatory Visit: Payer: Self-pay | Admitting: *Deleted

## 2022-02-06 NOTE — Patient Outreach (Signed)
  Care Coordination   02/06/2022 Name: Erika Lee MRN: [id number] DOB: 09-17-1945   Care Coordination Outreach Attempts:  An unsuccessful telephone outreach was attempted today to offer the patient information about available care coordination services as a benefit of their health plan.   Follow Up Plan:  Additional outreach attempts will be made to offer the patient care coordination information and services.   Encounter Outcome:  No Answer  Care Coordination Interventions Activated:  Yes   Care Coordination Interventions:  No, not indicated    [place] Management [phone]

## 2022-02-14 ENCOUNTER — Other Ambulatory Visit: Payer: Self-pay

## 2022-02-14 ENCOUNTER — Other Ambulatory Visit: Payer: Self-pay | Admitting: Family Medicine

## 2022-02-14 MED ORDER — HYDROCODONE-ACETAMINOPHEN 10-325 MG PO TABS: 1.0000 | ORAL_TABLET | Freq: Four times a day (QID) | ORAL | 0 refills | Status: DC | PRN

## 2022-02-14 NOTE — Telephone Encounter (Signed)
Pt called in requesting a refill of HYDROcodone-acetaminophen (NORCO) 10-325 MG tablet [[id number]], to be sent to the [facility] on [address]   Cb#: [phone]

## 2022-02-18 ENCOUNTER — Encounter: Payer: PPO | Admitting: Internal Medicine

## 2022-02-22 DIAGNOSIS — M62830 Muscle spasm of back: Secondary | ICD-10-CM | POA: Diagnosis not present

## 2022-02-22 DIAGNOSIS — M9903 Segmental and somatic dysfunction of lumbar region: Secondary | ICD-10-CM | POA: Diagnosis not present

## 2022-02-22 DIAGNOSIS — M9901 Segmental and somatic dysfunction of cervical region: Secondary | ICD-10-CM | POA: Diagnosis not present

## 2022-02-22 DIAGNOSIS — M546 Pain in thoracic spine: Secondary | ICD-10-CM | POA: Diagnosis not present

## 2022-02-22 DIAGNOSIS — M9902 Segmental and somatic dysfunction of thoracic region: Secondary | ICD-10-CM | POA: Diagnosis not present

## 2022-02-22 DIAGNOSIS — M542 Cervicalgia: Secondary | ICD-10-CM | POA: Diagnosis not present

## 2022-02-26 ENCOUNTER — Other Ambulatory Visit: Payer: Self-pay | Admitting: Neurosurgery

## 2022-02-26 DIAGNOSIS — I671 Cerebral aneurysm, nonruptured: Secondary | ICD-10-CM

## 2022-03-05 ENCOUNTER — Ambulatory Visit
Admission: RE | Admit: 2022-03-05 | Discharge: 2022-03-05 | Disposition: A | Discharge status: Home / Self Care | Payer: PPO | Source: Ambulatory Visit | Attending: Neurosurgery | Admitting: Neurosurgery

## 2022-03-05 DIAGNOSIS — I671 Cerebral aneurysm, nonruptured: Secondary | ICD-10-CM | POA: Diagnosis not present

## 2022-03-08 ENCOUNTER — Other Ambulatory Visit: Payer: Self-pay | Admitting: Family Medicine

## 2022-03-08 MED ORDER — DIAZEPAM 5 MG PO TABS: 5.0000 mg | ORAL_TABLET | Freq: Every evening | ORAL | 0 refills | Status: DC | PRN

## 2022-03-14 ENCOUNTER — Encounter: Payer: Self-pay | Admitting: *Deleted

## 2022-03-14 ENCOUNTER — Ambulatory Visit: Payer: Self-pay | Admitting: *Deleted

## 2022-03-14 NOTE — Patient Instructions (Signed)
Visit Information  Thank you for taking time to visit with me today. Please don't hesitate to contact me if I can be of assistance to you.   Please call the care guide team at [phone] if you need to cancel or reschedule your appointment.   If you are experiencing a Mental Health or Behavioral Health Crisis or need someone to talk to, please call the Suicide and Crisis Lifeline: 988 call the [place] National Suicide Prevention Lifeline: 1-800-273-8255 or TTY: 1-800-799-4 TTY (1-800-799-4889) to talk to a trained counselor call 1-800-273-TALK (toll free, 24 hour hotline) go to [facility] [address], [place] ([phone]) call the [place] Crisis Line: 800-939-9988 call 911  Patient verbalizes understanding of instructions and care plan provided today and agrees to view in MyChart. Active MyChart status and patient understanding of how to access instructions and care plan via MyChart confirmed with patient.     No further follow up required.   , BSW, MSW, LCSW  Licensed Clinical Social Worker  [facility]  Mailing Address-[address], [place], [place] [address] Physical Address-[address], , [place] [address] Toll Free Main # [phone] Fax # [phone] Cell # [phone] [email or web address]            

## 2022-03-14 NOTE — Patient Outreach (Signed)
  Care Coordination   Initial Visit Note   03/14/2022  Name: Erika Lee MRN: [id number] DOB: 30-Jun-1946  Erika Lee is a 76 y.o. year old female who sees Pickard, Cammie Mcgee, MD for primary care. I spoke with Erika Lee by phone today.  What matters to the patients health and wellness today?  No Interventions Identified.   SDOH assessments and interventions completed:  Yes.   SDOH Interventions Today    Flowsheet Row Most Recent Value  SDOH Interventions   Food Insecurity Interventions Intervention Not Indicated  Housing Interventions Intervention Not Indicated  Transportation Interventions Intervention Not Indicated  Utilities Interventions Intervention Not Indicated  Alcohol Usage Interventions Intervention Not Indicated (Score <7)  Financial Strain Interventions Intervention Not Indicated  Physical Activity Interventions Patient Refused  Stress Interventions Intervention Not Indicated  Social Connections Interventions Intervention Not Indicated        Care Coordination Interventions Activated:  Yes.   Care Coordination Interventions:  Yes, provided.   Follow up plan: No further intervention required.   Encounter Outcome:  Pt. Visit Completed.   Nat Christen, BSW, MSW, LCSW  Licensed Education officer, [facility]  Mailing [place] [address], [place], [place] [address] Physical Address-[address] [place], [place] [address] Toll Free Main # [phone] Fax # [phone] Cell # [phone] [email or web address]

## 2022-03-22 DIAGNOSIS — M9903 Segmental and somatic dysfunction of lumbar region: Secondary | ICD-10-CM | POA: Diagnosis not present

## 2022-03-22 DIAGNOSIS — M542 Cervicalgia: Secondary | ICD-10-CM | POA: Diagnosis not present

## 2022-03-22 DIAGNOSIS — M9901 Segmental and somatic dysfunction of cervical region: Secondary | ICD-10-CM | POA: Diagnosis not present

## 2022-03-22 DIAGNOSIS — M546 Pain in thoracic spine: Secondary | ICD-10-CM | POA: Diagnosis not present

## 2022-03-22 DIAGNOSIS — M62830 Muscle spasm of back: Secondary | ICD-10-CM | POA: Diagnosis not present

## 2022-03-22 DIAGNOSIS — M9902 Segmental and somatic dysfunction of thoracic region: Secondary | ICD-10-CM | POA: Diagnosis not present

## 2022-03-25 DIAGNOSIS — I671 Cerebral aneurysm, nonruptured: Secondary | ICD-10-CM | POA: Diagnosis not present

## 2022-03-25 DIAGNOSIS — M549 Dorsalgia, unspecified: Secondary | ICD-10-CM | POA: Diagnosis not present

## 2022-04-04 DIAGNOSIS — M549 Dorsalgia, unspecified: Secondary | ICD-10-CM | POA: Diagnosis not present

## 2022-04-10 DIAGNOSIS — M549 Dorsalgia, unspecified: Secondary | ICD-10-CM | POA: Diagnosis not present

## 2022-04-11 ENCOUNTER — Ambulatory Visit (INDEPENDENT_AMBULATORY_CARE_PROVIDER_SITE_OTHER): Payer: PPO | Admitting: Family Medicine

## 2022-04-11 ENCOUNTER — Ambulatory Visit: Payer: PPO | Admitting: Family Medicine

## 2022-04-11 ENCOUNTER — Other Ambulatory Visit: Payer: Self-pay | Admitting: Family Medicine

## 2022-04-11 VITALS — BP 132/76 | HR 56 | Ht 65.0 in | Wt 157.0 lb

## 2022-04-11 DIAGNOSIS — I480 Paroxysmal atrial fibrillation: Secondary | ICD-10-CM

## 2022-04-11 DIAGNOSIS — I2510 Atherosclerotic heart disease of native coronary artery without angina pectoris: Secondary | ICD-10-CM | POA: Diagnosis not present

## 2022-04-11 DIAGNOSIS — I130 Hypertensive heart and chronic kidney disease with heart failure and stage 1 through stage 4 chronic kidney disease, or unspecified chronic kidney disease: Secondary | ICD-10-CM | POA: Diagnosis not present

## 2022-04-11 DIAGNOSIS — E118 Type 2 diabetes mellitus with unspecified complications: Secondary | ICD-10-CM | POA: Diagnosis not present

## 2022-04-11 MED ORDER — BUMETANIDE 2 MG PO TABS: 2.0000 mg | ORAL_TABLET | Freq: Every day | ORAL | 1 refills | Status: DC

## 2022-04-11 MED ORDER — TRIAMCINOLONE ACETONIDE 0.1 % EX CREA: 1.0000 | TOPICAL_CREAM | Freq: Two times a day (BID) | CUTANEOUS | 0 refills | Status: DC

## 2022-04-11 NOTE — Progress Notes (Signed)
Wt Readings from Last 3 Encounters:  04/11/22 157 lb (71.2 kg)  01/17/22 157 lb (71.2 kg)  11/13/21 157 lb 12.8 oz (71.6 kg)       Subjective:    Patient ID: Erika Lee, female    DOB: 1946-03-17, 76 y.o.   MRN: [id number]  Patient is a 76 year old Caucasian female who presents today with swelling in both legs.  She has +1 to +2 edema in both legs distal to the knee bilaterally.  It is pitting.  The legs are starting become erythematous with erythematous pink papules on the dorsal surface of the leg.  They are also itching.  She is developing venous stasis dermatitis.  She has been taking Lasix to 40 mg daily and has been doing so for several weeks without any improvement.  This prompted her to make the appointment today.  Her weight is staying stable Wt Readings from Last 3 Encounters:  04/11/22 157 lb (71.2 kg)  01/17/22 157 lb (71.2 kg)  11/13/21 157 lb 12.8 oz (71.6 kg)   She denies any chest pain shortness of breath or dyspnea on exertion.  Her lungs are clear to auscultation bilaterally. Past Medical History:  Diagnosis Date   Anticoagulant long-term use    eliquis   Chronic sinusitis    GERD (gastroesophageal reflux disease)    Hemorrhoids    History of colitis 10/2016   campylobacter   Hyperlipidemia    Hypertension    Multinodular goiter    per pt has had 2 biopsy's both benign   NSTEMI (non-ST elevated myocardial infarction) ([place])    12/2018- normal coronary arteries on cath, poss coronary vasospasm.    Osteoarthritis    "all over"  and [facility] joint left shoulder   Paroxysmal atrial fibrillation [facility]) EP cardiologist--  [name]   s/p PVI by [name] 07/16/2013;   pt first dx 2008,  recurrence 2014 afib/flutter and tachy-brady   Rotator cuff tear, left    Shoulder impingement, left    Status post placement of implantable loop recorder    original placement 07-26-2014;  removal / replacement 10-17-2017  by dr allred   Type 2 diabetes mellitus ([place])    followed by pcp    Past Surgical History:  Procedure Laterality Date   ATRIAL FIBRILLATION ABLATION N/A 07/16/2013   PVI and CTI ablation by [name]   CARDIAC CATHETERIZATION     01/11/2019- Normal coronary arteries.    COLONOSCOPY  09/10/2011   Dr. Arnoldo Morale: normal, 10 year follow up.   implantable loop recorder removal  04/25/2021   MDT LINQ removed by [name]   LEFT HEART CATH AND CORONARY ANGIOGRAPHY N/A 01/11/2019   Procedure: LEFT HEART CATH AND CORONARY ANGIOGRAPHY;  Surgeon: Lorretta Harp, MD;  Location: [facility];  Service: Cardiovascular;  Laterality: N/A;   LESION REMOVAL N/A 05/03/2013   Procedure: EXCISION CYST, BACK;  Surgeon: Jamesetta So, MD;  Location: [facility];  Service: General;  Laterality: N/A;   LOOP RECORDER IMPLANT N/A 07/26/2014   Procedure: LOOP RECORDER IMPLANT;  Surgeon: Thompson Grayer, MD;  Location: [facility] CATH LAB;  Service: Cardiovascular;  Laterality: N/A;   LOOP RECORDER INSERTION N/A 10/17/2017   MDT previously implanted ILR for afib management at [facility].  old device removed and new device placed by [name]   LOOP RECORDER REMOVAL N/A 10/17/2017   MDT ILR removed with new device subsequently replaced   SHOULDER ARTHROSCOPY WITH ROTATOR CUFF REPAIR AND SUBACROMIAL DECOMPRESSION  Left 01/07/2019   Procedure: Left shoulder subacromial decompression, distal clavicle resection, extensive debridement,;  Surgeon: Nicholes Stairs, MD;  Location: [facility];  Service: Orthopedics;  Laterality: Left;   TEE WITHOUT CARDIOVERSION N/A 07/15/2013   Procedure: TRANSESOPHAGEAL ECHOCARDIOGRAM (TEE);  Surgeon: Lelon Perla, MD;  Location: [facility] ENDOSCOPY;  Service: Cardiovascular;  Laterality: N/A;   TONSILLECTOMY  age 38   TRANSANAL HEMORRHOIDAL DEARTERIALIZATION N/A 09/22/2015   Procedure: TRANSANAL HEMORRHOIDAL LIGATION/PEXY EUA POSSIBLE HEMORRHOIDECTOMY ;  Surgeon: Michael Boston, MD;  Location: [facility]S;  Service: General;  Laterality: N/A;   Current  Outpatient Medications on File Prior to Visit  Medication Sig Dispense Refill   amLODipine (NORVASC) 5 MG tablet Take 0.5 tablets (2.5 mg total) by mouth daily. 45 tablet 2   diazepam (VALIUM) 5 MG tablet Take 1 tablet (5 mg total) by mouth at bedtime as needed (sleep). 30 tablet 0   diclofenac Sodium (VOLTAREN) 1 % GEL Apply 2 g topically 4 (four) times daily. 100 g 2   ELIQUIS 5 MG TABS tablet TAKE 1 TABLET(5 MG) BY MOUTH TWICE DAILY 60 tablet 3   esomeprazole (NEXIUM) 20 MG capsule Take 20 mg by mouth daily.      furosemide (LASIX) 20 MG tablet Take 1 tablet (20 mg total) by mouth daily. 30 tablet 1   glucose blood (FREESTYLE PRECISION NEO TEST) test strip DX: E11.9 Use to check blood sugar daily 50 strip 11   HYDROcodone-acetaminophen (NORCO) 10-325 MG tablet Take 1 tablet by mouth every 6 (six) hours as needed. 120 tablet 0   Lancets (FREESTYLE) lancets USE TO CHECK BLOOD SUGAR DAILY 100 each 2   lisinopril (ZESTRIL) 40 MG tablet TAKE 1 TABLET(40 MG) BY MOUTH DAILY 90 tablet 3   metFORMIN (GLUCOPHAGE) 500 MG tablet TAKE 1 TABLET(500 MG) BY MOUTH DAILY 90 tablet 0   ondansetron (ZOFRAN) 4 MG tablet Take 1 tablet (4 mg total) by mouth every 8 (eight) hours as needed for nausea or vomiting. 20 tablet 0   Potassium Gluconate 550 MG TABS Take 550 mg by mouth at bedtime.      promethazine (PHENERGAN) 12.5 MG tablet TAKE 1 TABLET(12.5 MG) BY MOUTH EVERY 6 HOURS AS NEEDED FOR NAUSEA OR VOMITING 30 tablet 3   simvastatin (ZOCOR) 40 MG tablet TAKE 1 TABLET(40 MG) BY MOUTH AT BEDTIME 90 tablet 0   No current facility-administered medications on file prior to visit.     Allergies  Allergen Reactions   Clindamycin/Lincomycin Rash   Doxycycline Other (See Comments)    Makes heart race   Keflex [Cephalexin] Nausea And Vomiting   Other    Sulfonic Acid (3,5-Dibromo-4-H-Ox-Benz)    Adhesive [Tape] Rash   Latex Rash   Sulfonamide Derivatives Nausea And Vomiting   Social History   Socioeconomic  History   Marital status: Married    Spouse name: Lawyer   Number of children: Not on file   Years of education: 12   Highest education level: 12th grade  Occupational History   Occupation: Pensions consultant: RETIRED    Comment: Full time  Tobacco Use   Smoking status: Former    Years: 34.00    Types: Cigarettes    Quit date: 01/02/1994    Years since quitting: 28.2    Passive exposure: Past   Smokeless tobacco: Never  Vaping Use   Vaping Use: Never used  Substance and Sexual Activity   Alcohol use: No   Drug  use: No   Sexual activity: Not Currently    Partners: Male  Other Topics Concern   Not on file  Social History Narrative   Lives with spouse in [place]   Social Determinants of Health   Financial Resource Strain: Low Risk  (03/14/2022)   Overall Financial Resource Strain (CARDIA)    Difficulty of Paying Living Expenses: Not hard at all  Food Insecurity: No Food Insecurity (03/14/2022)   Hunger Vital Sign    Worried About Running Out of Food in the Last Year: Never true    Ran Out of Food in the Last Year: Never true  Transportation Needs: No Transportation Needs (03/14/2022)   PRAPARE - Hydrologist (Medical): No    Lack of Transportation (Non-Medical): No  Physical Activity: Inactive (03/14/2022)   Exercise Vital Sign    Days of Exercise per Week: 0 days    Minutes of Exercise per Session: 0 min  Stress: No Stress Concern Present (03/14/2022)   [place]    Feeling of Stress : Only a little  Social Connections: Moderately Integrated (03/14/2022)   Social Connection and Isolation Panel [NHANES]    Frequency of Communication with Friends and Family: More than three times a week    Frequency of Social Gatherings with Friends and Family: More than three times a week    Attends Religious Services: More than 4 times per year    Active Member of [employer] or  Organizations: No    Attends Archivist Meetings: Never    Marital Status: Married  Human resources officer Violence: Not At Risk (03/14/2022)   Humiliation, Afraid, Rape, and Kick questionnaire    Fear of Current or Ex-Partner: No    Emotionally Abused: No    Physically Abused: No    Sexually Abused: No     Review of Systems  All other systems reviewed and are negative.      Objective:   Physical Exam Vitals reviewed.  Constitutional:      General: She is not in acute distress.    Appearance: Normal appearance. She is not ill-appearing or toxic-appearing.  Eyes:     Extraocular Movements: Extraocular movements intact.     Conjunctiva/sclera: Conjunctivae normal.     Pupils: Pupils are equal, round, and reactive to light.  Cardiovascular:     Rate and Rhythm: Normal rate and regular rhythm.     Pulses: Normal pulses.     Heart sounds: Normal heart sounds. No murmur heard.    No friction rub. No gallop.  Pulmonary:     Effort: Pulmonary effort is normal. No respiratory distress.     Breath sounds: No stridor. Rales present. No wheezing or rhonchi.  Abdominal:     General: Abdomen is flat. Bowel sounds are normal. There is no distension.     Palpations: Abdomen is soft.     Tenderness: There is no abdominal tenderness. There is no guarding.  Musculoskeletal:     Cervical back: Neck supple.     Right lower leg: Edema present.     Left lower leg: Edema present.  Skin:    General: Skin is warm.     Coloration: Skin is not jaundiced.     Findings: Rash present. No bruising, erythema or lesion.  Neurological:     General: No focal deficit present.     Mental Status: She is alert and oriented to person, place, and time.  Cranial Nerves: No cranial nerve deficit.     Sensory: No sensory deficit.     Motor: No weakness.     Coordination: Coordination normal.     Gait: Gait normal.     Deep Tendon Reflexes: Reflexes normal.  Psychiatric:        Mood and Affect: Mood  normal.        Thought Content: Thought content normal.           Assessment & Plan:  Paroxysmal atrial fibrillation ([place]) - Plan: CBC with Differential/Platelet, COMPLETE METABOLIC PANEL WITH GFR, Brain natriuretic peptide  Controlled type 2 diabetes mellitus with complication, without long-term current use of insulin (HCC) - Plan: Hemoglobin A1c, CBC with Differential/Platelet, COMPLETE METABOLIC PANEL WITH GFR, Brain natriuretic peptide  CAD in native artery - Plan: CBC with Differential/Platelet, COMPLETE METABOLIC PANEL WITH GFR, Brain natriuretic peptide Patient is in normal sinus rhythm today.  She had an echocardiogram in February which revealed ejection fraction of 65% with grade 1 diastolic dysfunction.  I will check a BNP today.  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  Discontinue Lasix and replace with Bumex 2 mg daily and recheck on Monday.  If BNP is elevated suggesting preserved ejection fraction congestive heart failure, she will be a good candidate for Jardiance.  Recheck on Monday.  Monitor BMP to determine if potassium supplementation is necessary.  Check A1c while checking lab work.  Use triamcinolone cream twice daily as needed for itching.  I believe the itching and rash will resolve once the leg swelling has resolved

## 2022-04-12 LAB — CBC WITH DIFFERENTIAL/PLATELET
Absolute Monocytes: 611 cells/uL (ref 200–950)
Basophils Absolute: 39 cells/uL (ref 0–200)
Basophils Relative: 0.6 %
Eosinophils Absolute: 338 cells/uL (ref 15–500)
Eosinophils Relative: 5.2 %
HCT: 37.9 % (ref 35.0–45.0)
Hemoglobin: 12.8 g/dL (ref 11.7–15.5)
Lymphs Abs: 2802 cells/uL (ref 850–3900)
MCH: 29.7 pg (ref 27.0–33.0)
MCHC: 33.8 g/dL (ref 32.0–36.0)
MCV: 87.9 fL (ref 80.0–100.0)
MPV: 10.7 fL (ref 7.5–12.5)
Monocytes Relative: 9.4 %
Neutro Abs: 2711 cells/uL (ref 1500–7800)
Neutrophils Relative %: 41.7 %
Platelets: 208 10*3/uL (ref 140–400)
RBC: 4.31 10*6/uL (ref 3.80–5.10)
RDW: 12.2 % (ref 11.0–15.0)
Total Lymphocyte: 43.1 %
WBC: 6.5 10*3/uL (ref 3.8–10.8)

## 2022-04-12 LAB — COMPLETE METABOLIC PANEL WITH GFR
AG Ratio: 1.8 (calc) (ref 1.0–2.5)
ALT: 10 U/L (ref 6–29)
AST: 14 U/L (ref 10–35)
Albumin: 4.3 g/dL (ref 3.6–5.1)
Alkaline phosphatase (APISO): 82 U/L (ref 37–153)
BUN: 14 mg/dL (ref 7–25)
CO2: 29 mmol/L (ref 20–32)
Calcium: 10.3 mg/dL (ref 8.6–10.4)
Chloride: 101 mmol/L (ref 98–110)
Creat: 0.69 mg/dL (ref 0.60–1.00)
Globulin: 2.4 g/dL (calc) (ref 1.9–3.7)
Glucose, Bld: 101 mg/dL — ABNORMAL HIGH (ref 65–99)
Potassium: 4.2 mmol/L (ref 3.5–5.3)
Sodium: 138 mmol/L (ref 135–146)
Total Bilirubin: 0.3 mg/dL (ref 0.2–1.2)
Total Protein: 6.7 g/dL (ref 6.1–8.1)
eGFR: 89 mL/min/{1.73_m2} (ref >=60)

## 2022-04-12 LAB — HEMOGLOBIN A1C
Hgb A1c MFr Bld: 6.5 % of total Hgb — ABNORMAL HIGH (ref <5.7)
Mean Plasma Glucose: 140 mg/dL
eAG (mmol/L): 7.7 mmol/L

## 2022-04-12 LAB — BRAIN NATRIURETIC PEPTIDE: Brain Natriuretic Peptide: 131 pg/mL — ABNORMAL HIGH (ref <100)

## 2022-04-14 ENCOUNTER — Other Ambulatory Visit: Payer: Self-pay | Admitting: Family Medicine

## 2022-04-15 ENCOUNTER — Other Ambulatory Visit: Payer: Self-pay

## 2022-04-15 ENCOUNTER — Ambulatory Visit (INDEPENDENT_AMBULATORY_CARE_PROVIDER_SITE_OTHER): Payer: PPO | Admitting: Family Medicine

## 2022-04-15 ENCOUNTER — Telehealth: Payer: Self-pay | Admitting: Family Medicine

## 2022-04-15 VITALS — BP 128/64 | HR 58 | Wt 156.0 lb

## 2022-04-15 DIAGNOSIS — I5031 Acute diastolic (congestive) heart failure: Secondary | ICD-10-CM | POA: Diagnosis not present

## 2022-04-15 DIAGNOSIS — M7989 Other specified soft tissue disorders: Secondary | ICD-10-CM

## 2022-04-15 DIAGNOSIS — I10 Essential (primary) hypertension: Secondary | ICD-10-CM

## 2022-04-15 DIAGNOSIS — K838 Other specified diseases of biliary tract: Secondary | ICD-10-CM

## 2022-04-15 DIAGNOSIS — Z23 Encounter for immunization: Secondary | ICD-10-CM | POA: Diagnosis not present

## 2022-04-15 DIAGNOSIS — R112 Nausea with vomiting, unspecified: Secondary | ICD-10-CM

## 2022-04-15 MED ORDER — HYDROCODONE-ACETAMINOPHEN 10-325 MG PO TABS: 1.0000 | ORAL_TABLET | Freq: Four times a day (QID) | ORAL | 0 refills | Status: DC | PRN

## 2022-04-15 MED ORDER — AMLODIPINE BESYLATE 5 MG PO TABS: 2.5000 mg | ORAL_TABLET | Freq: Every day | ORAL | 2 refills | Status: DC

## 2022-04-15 MED ORDER — PROMETHAZINE HCL 12.5 MG PO TABS: ORAL_TABLET | ORAL | 3 refills | Status: DC

## 2022-04-15 NOTE — Telephone Encounter (Signed)
Patient stated she called in refills to her pharmacy for  amLODipine (NORVASC) 5 MG tablet   promethazine (PHENERGAN) 12.5 MG tablet    Patient checking on status of refills.  Pharmacy confirmed as:   [facility] [place], [address] AT [address], [place] [address]  Phone:  [phone]  Fax:  [phone]  DEA #:  [id number]  [place] 04/15/22  Please advise at [phone]

## 2022-04-15 NOTE — Addendum Note (Signed)
Addended by: [name] T on: 04/15/2022 03:59 PM   Modules accepted: Orders

## 2022-04-15 NOTE — Addendum Note (Signed)
Addended by: Randal Buba K on: 04/15/2022 11:46 AM   Modules accepted: Orders

## 2022-04-15 NOTE — Progress Notes (Signed)
Wt Readings from Last 3 Encounters:  04/15/22 156 lb (70.8 kg)  04/11/22 157 lb (71.2 kg)  01/17/22 157 lb (71.2 kg)       Subjective:    Patient ID: Erika Lee, female    DOB: Jan 21, 1946, 76 y.o.   MRN: [id number] Please see my office visit from last week.  Patient has only lost 1 additional pound despite aggressive diuresis with Bumex.  She continues to have swelling in both legs.  BNP was slightly elevated at 130.  Recent echocardiogram revealed normal ejection fraction.  Therefore I believe the patient has congestive heart failure with preserved ejection fraction. Past Medical History:  Diagnosis Date   Anticoagulant long-term use    eliquis   Chronic sinusitis    GERD (gastroesophageal reflux disease)    Hemorrhoids    History of colitis 10/2016   campylobacter   Hyperlipidemia    Hypertension    Multinodular goiter    per pt has had 2 biopsy's both benign   NSTEMI (non-ST elevated myocardial infarction) ([place])    12/2018- normal coronary arteries on cath, poss coronary vasospasm.    Osteoarthritis    "all over"  and [facility] joint left shoulder   Paroxysmal atrial fibrillation [facility]) EP cardiologist--  [name]   s/p PVI by [name] 07/16/2013;   pt first dx 2008,  recurrence 2014 afib/flutter and tachy-brady   Rotator cuff tear, left    Shoulder impingement, left    Status post placement of implantable loop recorder    original placement 07-26-2014;  removal / replacement 10-17-2017  by dr allred   Type 2 diabetes mellitus ([place])    followed by pcp   Past Surgical History:  Procedure Laterality Date   ATRIAL FIBRILLATION ABLATION N/A 07/16/2013   PVI and CTI ablation by [name]   CARDIAC CATHETERIZATION     01/11/2019-  Normal coronary arteries.    COLONOSCOPY  09/10/2011   Dr. Arnoldo Morale: normal, 10 year follow up.   implantable loop recorder removal  04/25/2021   MDT LINQ removed by [name]   LEFT HEART CATH AND CORONARY ANGIOGRAPHY N/A 01/11/2019   Procedure: LEFT HEART CATH AND CORONARY ANGIOGRAPHY;  Surgeon: Lorretta Harp, MD;  Location: [place] CV LAB;  Service: Cardiovascular;  Laterality: N/A;   LESION REMOVAL N/A 05/03/2013   Procedure: EXCISION CYST, BACK;  Surgeon: Jamesetta So, MD;  Location: [facility];  Service: General;  Laterality: N/A;   LOOP RECORDER IMPLANT N/A 07/26/2014   Procedure: LOOP RECORDER IMPLANT;  Surgeon: Thompson Grayer, MD;  Location: [facility] CATH LAB;  Service: Cardiovascular;  Laterality: N/A;   LOOP RECORDER INSERTION N/A 10/17/2017   MDT previously implanted ILR for afib management at [facility].  old device removed and  new device placed by [name]   LOOP RECORDER REMOVAL N/A 10/17/2017   MDT ILR removed with new device subsequently replaced   SHOULDER ARTHROSCOPY WITH ROTATOR CUFF REPAIR AND SUBACROMIAL DECOMPRESSION Left 01/07/2019   Procedure: Left shoulder subacromial decompression, distal clavicle resection, extensive debridement,;  Surgeon: Nicholes Stairs, MD;  Location: [facility];  Service: Orthopedics;  Laterality: Left;   TEE WITHOUT CARDIOVERSION N/A 07/15/2013   Procedure: TRANSESOPHAGEAL ECHOCARDIOGRAM (TEE);  Surgeon: Lelon Perla, MD;  Location: [facility] ENDOSCOPY;  Service: Cardiovascular;  Laterality: N/A;   TONSILLECTOMY  age 2   TRANSANAL HEMORRHOIDAL DEARTERIALIZATION N/A 09/22/2015   Procedure: TRANSANAL HEMORRHOIDAL LIGATION/PEXY EUA POSSIBLE HEMORRHOIDECTOMY ;  Surgeon: Michael Boston, MD;  Location: [facility];  Service: General;  Laterality: N/A;   Current Outpatient Medications on File Prior to Visit  Medication Sig Dispense Refill   amLODipine (NORVASC) 5 MG tablet Take 0.5 tablets (2.5 mg total) by mouth daily. 45 tablet 2    bumetanide (BUMEX) 2 MG tablet Take 1 tablet (2 mg total) by mouth daily. Stop furosemide 30 tablet 1   diazepam (VALIUM) 5 MG tablet Take 1 tablet (5 mg total) by mouth at bedtime as needed (sleep). 30 tablet 0   diclofenac Sodium (VOLTAREN) 1 % GEL Apply 2 g topically 4 (four) times daily. 100 g 2   ELIQUIS 5 MG TABS tablet TAKE 1 TABLET(5 MG) BY MOUTH TWICE DAILY 60 tablet 3   esomeprazole (NEXIUM) 20 MG capsule Take 20 mg by mouth daily.      furosemide (LASIX) 20 MG tablet Take 1 tablet (20 mg total) by mouth daily. 30 tablet 1   glucose blood (FREESTYLE PRECISION NEO TEST) test strip DX: E11.9 Use to check blood sugar daily 50 strip 11   HYDROcodone-acetaminophen (NORCO) 10-325 MG tablet Take 1 tablet by mouth every 6 (six) hours as needed. 120 tablet 0   Lancets (FREESTYLE) lancets USE TO CHECK BLOOD SUGAR DAILY 100 each 2   lisinopril (ZESTRIL) 40 MG tablet TAKE 1 TABLET(40 MG) BY MOUTH DAILY 90 tablet 3   metFORMIN (GLUCOPHAGE) 500 MG tablet TAKE 1 TABLET(500 MG) BY MOUTH DAILY 90 tablet 0   ondansetron (ZOFRAN) 4 MG tablet Take 1 tablet (4 mg total) by mouth every 8 (eight) hours as needed for nausea or vomiting. 20 tablet 0   Potassium Gluconate 550 MG TABS Take 550 mg by mouth at bedtime.      promethazine (PHENERGAN) 12.5 MG tablet TAKE 1 TABLET(12.5 MG) BY MOUTH EVERY 6 HOURS AS NEEDED FOR NAUSEA OR VOMITING 30 tablet 3   simvastatin (ZOCOR) 40 MG tablet TAKE 1 TABLET(40 MG) BY MOUTH AT BEDTIME 90 tablet 0   triamcinolone cream (KENALOG) 0.1 % Apply 1 Application topically 2 (two) times daily. 30 g 0   No current facility-administered medications on file prior to visit.     Allergies  Allergen Reactions   Clindamycin/Lincomycin Rash   Doxycycline Other (See Comments)    Makes heart race   Keflex [Cephalexin] Nausea And Vomiting   Other    Sulfonic Acid (3,5-Dibromo-4-H-Ox-Benz)    Adhesive [Tape] Rash   Latex Rash   Sulfonamide Derivatives Nausea And Vomiting   Social  History   Socioeconomic History   Marital status: Married    Spouse name: Wendall Stade   Number of children: Not on file   Years of education: 12   Highest education level: 12th grade  Occupational History   Occupation: Clinical biochemist  Employer: RETIRED    Comment: Full time  Tobacco Use   Smoking status: Former    Years: 34.00    Types: Cigarettes    Quit date: 01/02/1994    Years since quitting: 28.3    Passive exposure: Past   Smokeless tobacco: Never  Vaping Use   Vaping Use: Never used  Substance and Sexual Activity   Alcohol use: No   Drug use: No   Sexual activity: Not Currently    Partners: Male  Other Topics Concern   Not on file  Social History Narrative   Lives with spouse in [place] Determinants of Health   Financial Resource Strain: Low Risk  (03/14/2022)   Overall Financial Resource Strain (CARDIA)    Difficulty of Paying Living Expenses: Not hard at all  Food Insecurity: No Food Insecurity (03/14/2022)   Hunger Vital Sign    Worried About Running Out of Food in the Last Year: Never true    Ran Out of Food in the Last Year: Never true  Transportation Needs: No Transportation Needs (03/14/2022)   PRAPARE - Hydrologist (Medical): No    Lack of Transportation (Non-Medical): No  Physical Activity: Inactive (03/14/2022)   Exercise Vital Sign    Days of Exercise per Week: 0 days    Minutes of Exercise per Session: 0 min  Stress: No Stress Concern Present (03/14/2022)   [place]    Feeling of Stress : Only a little  Social Connections: Moderately Integrated (03/14/2022)   Social Connection and Isolation Panel [NHANES]    Frequency of Communication with Friends and Family: More than three times a week    Frequency of Social Gatherings with Friends and Family: More than three times a week    Attends Religious Services: More than 4 times per year     Active Member of [employer] or Organizations: No    Attends Archivist Meetings: Never    Marital Status: Married  Human resources officer Violence: Not At Risk (03/14/2022)   Humiliation, Afraid, Rape, and Kick questionnaire    Fear of Current or Ex-Partner: No    Emotionally Abused: No    Physically Abused: No    Sexually Abused: No     Review of Systems  All other systems reviewed and are negative.      Objective:   Physical Exam Vitals reviewed.  Constitutional:      General: She is not in acute distress.    Appearance: Normal appearance. She is not ill-appearing or toxic-appearing.  Eyes:     Extraocular Movements: Extraocular movements intact.     Conjunctiva/sclera: Conjunctivae normal.     Pupils: Pupils are equal, round, and reactive to light.  Cardiovascular:     Rate and Rhythm: Normal rate and regular rhythm.     Pulses: Normal pulses.     Heart sounds: Normal heart sounds. No murmur heard.    No friction rub. No gallop.  Pulmonary:     Effort: Pulmonary effort is normal. No respiratory distress.     Breath sounds: No stridor. Rales present. No wheezing or rhonchi.  Abdominal:     General: Abdomen is flat. Bowel sounds are normal. There is no distension.     Palpations: Abdomen is soft.     Tenderness: There is no abdominal tenderness. There is no guarding.  Musculoskeletal:     Cervical back: Neck supple.  Right lower leg: Edema present.     Left lower leg: Edema present.  Skin:    General: Skin is warm.     Coloration: Skin is not jaundiced.     Findings: Rash present. No bruising, erythema or lesion.  Neurological:     General: No focal deficit present.     Mental Status: She is alert and oriented to person, place, and time.     Cranial Nerves: No cranial nerve deficit.     Sensory: No sensory deficit.     Motor: No weakness.     Coordination: Coordination normal.     Gait: Gait normal.     Deep Tendon Reflexes: Reflexes normal.  Psychiatric:         Mood and Affect: Mood normal.        Thought Content: Thought content normal.           Assessment & Plan:   Leg swelling  Acute diastolic congestive heart failure (HCC) Continue Bumex.  I wrapped both legs and put them in Unna boots today.  Recheck on Thursday.  Recommended switching metformin to Jardiance due to heart failure with preserved ejection fraction.  Patient will check on the price.  Recheck Thursday

## 2022-04-18 ENCOUNTER — Ambulatory Visit (INDEPENDENT_AMBULATORY_CARE_PROVIDER_SITE_OTHER): Payer: PPO | Admitting: Family Medicine

## 2022-04-18 VITALS — BP 112/56 | HR 61 | Ht 65.0 in | Wt 154.0 lb

## 2022-04-18 DIAGNOSIS — I5031 Acute diastolic (congestive) heart failure: Secondary | ICD-10-CM

## 2022-04-18 NOTE — Progress Notes (Signed)
Subjective:    Patient ID: Erika Lee, female    DOB: 01/30/1946, 76 y.o.   MRN: [id number] 04/15/22 Please see my office visit from last week.  Patient has only lost 1 additional pound despite aggressive diuresis with Bumex.  She continues to have swelling in both legs.  BNP was slightly elevated at 130.  Recent echocardiogram revealed normal ejection fraction.  Therefore I believe the patient has congestive heart failure with preserved ejection fraction.  At that time, my plan was: Continue Bumex.  I wrapped both legs and put them in Unna boots today.  Recheck on Thursday.  Recommended switching metformin to Jardiance due to heart failure with preserved ejection fraction.  Patient will check on the price.  Recheck Thursday  04/18/22 Wt Readings from Last 3 Encounters:  04/18/22 154 lb (69.9 kg)  04/15/22 156 lb (70.8 kg)  04/11/22 157 lb (71.2 kg)   Patient has lost 3 pounds over the last week.  The swelling in her legs has improved.  I removed the Unna boots today.  There is no evidence of secondary cellulitis or weeping ulcers.  She denies any shortness of breath.  She is started the Jardiance 10 mg a day.  Past Medical History:  Diagnosis Date   Anticoagulant long-term use    eliquis   Chronic sinusitis    GERD (gastroesophageal reflux disease)    Hemorrhoids    History of colitis 10/2016   campylobacter   Hyperlipidemia    Hypertension    Multinodular goiter    per pt has had 2 biopsy's both benign   NSTEMI (non-ST elevated myocardial infarction) ([place])    12/2018- normal coronary arteries on cath, poss coronary vasospasm.    Osteoarthritis    "all over"  and [facility] joint left shoulder   Paroxysmal atrial fibrillation [facility]) EP cardiologist--  [name]   s/p PVI by [name] 07/16/2013;   pt first dx 2008,  recurrence 2014 afib/flutter and tachy-brady   Rotator cuff tear, left    Shoulder impingement, left    Status post placement of implantable loop recorder    original  placement 07-26-2014;  removal / replacement 10-17-2017  by dr allred   Type 2 diabetes mellitus ([place])    followed by pcp   Past Surgical History:  Procedure Laterality Date   ATRIAL FIBRILLATION ABLATION N/A 07/16/2013   PVI and CTI ablation by [name]   CARDIAC CATHETERIZATION     01/11/2019- Normal coronary arteries.    COLONOSCOPY  09/10/2011   Dr. Arnoldo Morale: normal, 10 year follow up.   implantable loop recorder removal  04/25/2021   MDT LINQ removed by [name]   LEFT HEART CATH AND CORONARY ANGIOGRAPHY N/A 01/11/2019   Procedure: LEFT HEART CATH AND CORONARY ANGIOGRAPHY;  Surgeon: Lorretta Harp, MD;  Location: [place] CV LAB;  Service: Cardiovascular;  Laterality: N/A;   LESION REMOVAL N/A 05/03/2013   Procedure: EXCISION CYST, BACK;  Surgeon: Jamesetta So, MD;  Location: [facility];  Service: General;  Laterality: N/A;   LOOP RECORDER IMPLANT N/A 07/26/2014   Procedure: LOOP RECORDER IMPLANT;  Surgeon: Thompson Grayer, MD;  [facility] CATH LAB;  Service: Cardiovascular;  Laterality: N/A;   LOOP RECORDER INSERTION N/A 10/17/2017   MDT previously implanted ILR for afib management at [facility].  old device removed and new device placed by [name]   LOOP RECORDER REMOVAL N/A 10/17/2017   MDT ILR removed with new device subsequently  replaced   SHOULDER ARTHROSCOPY WITH ROTATOR CUFF REPAIR AND SUBACROMIAL DECOMPRESSION Left 01/07/2019   Procedure: Left shoulder subacromial decompression, distal clavicle resection, extensive debridement,;  Surgeon: Nicholes Stairs, MD;  Location: [facility];  Service: Orthopedics;  Laterality: Left;   TEE WITHOUT CARDIOVERSION N/A 07/15/2013   Procedure: TRANSESOPHAGEAL ECHOCARDIOGRAM (TEE);  Surgeon: Lelon Perla, MD;  Location: [facility] ENDOSCOPY;  Service: Cardiovascular;  Laterality: N/A;   TONSILLECTOMY  age 68   TRANSANAL HEMORRHOIDAL DEARTERIALIZATION N/A 09/22/2015   Procedure: TRANSANAL HEMORRHOIDAL LIGATION/PEXY EUA  POSSIBLE HEMORRHOIDECTOMY ;  Surgeon: Michael Boston, MD;  Location: [facility];  Service: General;  Laterality: N/A;   Current Outpatient Medications on File Prior to Visit  Medication Sig Dispense Refill   amLODipine (NORVASC) 5 MG tablet Take 0.5 tablets (2.5 mg total) by mouth daily. 45 tablet 2   bumetanide (BUMEX) 2 MG tablet Take 1 tablet (2 mg total) by mouth daily. Stop furosemide 30 tablet 1   diazepam (VALIUM) 5 MG tablet Take 1 tablet (5 mg total) by mouth at bedtime as needed (sleep). 30 tablet 0   diclofenac Sodium (VOLTAREN) 1 % GEL Apply 2 g topically 4 (four) times daily. 100 g 2   ELIQUIS 5 MG TABS tablet TAKE 1 TABLET(5 MG) BY MOUTH TWICE DAILY 60 tablet 3   esomeprazole (NEXIUM) 20 MG capsule Take 20 mg by mouth daily.      furosemide (LASIX) 20 MG tablet Take 1 tablet (20 mg total) by mouth daily. 30 tablet 1   glucose blood (FREESTYLE PRECISION NEO TEST) test strip DX: E11.9 Use to check blood sugar daily 50 strip 11   HYDROcodone-acetaminophen (NORCO) 10-325 MG tablet Take 1 tablet by mouth every 6 (six) hours as needed. 120 tablet 0   Lancets (FREESTYLE) lancets USE TO CHECK BLOOD SUGAR DAILY 100 each 2   lisinopril (ZESTRIL) 40 MG tablet TAKE 1 TABLET(40 MG) BY MOUTH DAILY 90 tablet 3   metFORMIN (GLUCOPHAGE) 500 MG tablet TAKE 1 TABLET(500 MG) BY MOUTH DAILY 90 tablet 0   ondansetron (ZOFRAN) 4 MG tablet Take 1 tablet (4 mg total) by mouth every 8 (eight) hours as needed for nausea or vomiting. 20 tablet 0   Potassium Gluconate 550 MG TABS Take 550 mg by mouth at bedtime.      promethazine (PHENERGAN) 12.5 MG tablet TAKE 1 TABLET(12.5 MG) BY MOUTH EVERY 6 HOURS AS NEEDED FOR NAUSEA OR VOMITING 30 tablet 3   simvastatin (ZOCOR) 40 MG tablet TAKE 1 TABLET(40 MG) BY MOUTH AT BEDTIME 90 tablet 0   triamcinolone cream (KENALOG) 0.1 % Apply 1 Application topically 2 (two) times daily. 30 g 0   No current facility-administered medications on file prior to visit.     Allergies   Allergen Reactions   Clindamycin/Lincomycin Rash   Doxycycline Other (See Comments)    Makes heart race   Keflex [Cephalexin] Nausea And Vomiting   Other    Sulfonic Acid (3,5-Dibromo-4-H-Ox-Benz)    Adhesive [Tape] Rash   Latex Rash   Sulfonamide Derivatives Nausea And Vomiting   Social History   Socioeconomic History   Marital status: Married    Spouse name: Wendall Stade   Number of children: Not on file   Years of education: 12   Highest education level: 12th grade  Occupational History   Occupation: Pensions consultant: RETIRED    Comment: Full time  Tobacco Use   Smoking status: Former    Years: 34.00  Types: Cigarettes    Quit date: 01/02/1994    Years since quitting: 28.3    Passive exposure: Past   Smokeless tobacco: Never  Vaping Use   Vaping Use: Never used  Substance and Sexual Activity   Alcohol use: No   Drug use: No   Sexual activity: Not Currently    Partners: Male  Other Topics Concern   Not on file  Social History Narrative   Lives with spouse in [place]   Social Determinants of Health   Financial Resource Strain: Low Risk  (03/14/2022)   Overall Financial Resource Strain (CARDIA)    Difficulty of Paying Living Expenses: Not hard at all  Food Insecurity: No Food Insecurity (03/14/2022)   Hunger Vital Sign    Worried About Running Out of Food in the Last Year: Never true    Ran Out of Food in the Last Year: Never true  Transportation Needs: No Transportation Needs (03/14/2022)   PRAPARE - Hydrologist (Medical): No    Lack of Transportation (Non-Medical): No  Physical Activity: Inactive (03/14/2022)   Exercise Vital Sign    Days of Exercise per Week: 0 days    Minutes of Exercise per Session: 0 min  Stress: No Stress Concern Present (03/14/2022)   [place]    Feeling of Stress : Only a little  Social Connections: Moderately  Integrated (03/14/2022)   Social Connection and Isolation Panel [NHANES]    Frequency of Communication with Friends and Family: More than three times a week    Frequency of Social Gatherings with Friends and Family: More than three times a week    Attends Religious Services: More than 4 times per year    Active Member of [employer] or Organizations: No    Attends Archivist Meetings: Never    Marital Status: Married  Human resources officer Violence: Not At Risk (03/14/2022)   Humiliation, Afraid, Rape, and Kick questionnaire    Fear of Current or Ex-Partner: No    Emotionally Abused: No    Physically Abused: No    Sexually Abused: No     Review of Systems  All other systems reviewed and are negative.      Objective:   Physical Exam Vitals reviewed.  Constitutional:      General: She is not in acute distress.    Appearance: Normal appearance. She is not ill-appearing or toxic-appearing.  Eyes:     Extraocular Movements: Extraocular movements intact.     Conjunctiva/sclera: Conjunctivae normal.     Pupils: Pupils are equal, round, and reactive to light.  Cardiovascular:     Rate and Rhythm: Normal rate and regular rhythm.     Pulses: Normal pulses.     Heart sounds: Normal heart sounds. No murmur heard.    No friction rub. No gallop.  Pulmonary:     Effort: Pulmonary effort is normal. No respiratory distress.     Breath sounds: No stridor. No wheezing, rhonchi or rales.  Abdominal:     General: Abdomen is flat. Bowel sounds are normal. There is no distension.     Palpations: Abdomen is soft.     Tenderness: There is no abdominal tenderness. There is no guarding.  Musculoskeletal:     Cervical back: Neck supple.     Right lower leg: Edema present.     Left lower leg: Edema present.  Skin:    General: Skin is warm.  Coloration: Skin is not jaundiced.     Findings: No bruising, erythema, lesion or rash.  Neurological:     General: No focal deficit present.     Mental  Status: She is alert and oriented to person, place, and time.     Cranial Nerves: No cranial nerve deficit.     Sensory: No sensory deficit.     Motor: No weakness.     Coordination: Coordination normal.     Gait: Gait normal.     Deep Tendon Reflexes: Reflexes normal.  Psychiatric:        Mood and Affect: Mood normal.        Thought Content: Thought content normal.           Assessment & Plan:   Acute diastolic congestive heart failure (HCC) - Plan: BASIC METABOLIC PANEL WITH GFR The swelling in her legs is better.  I want her to start wearing knee-high compression hose 10 to 20 mmHg to help with third spacing and peripheral edema.  Continue Bumex at the current dose 2 mg a day.  Continue Jardiance 10 mg a day and check a BMP to monitor kidney function and potassium.  If the swelling worsens, I will add spironolactone next as a diuretic and optimize therapy for heart failure with preserved ejection fraction

## 2022-04-19 LAB — BASIC METABOLIC PANEL WITH GFR
BUN: 16 mg/dL (ref 7–25)
CO2: 32 mmol/L (ref 20–32)
Calcium: 10.1 mg/dL (ref 8.6–10.4)
Chloride: 97 mmol/L — ABNORMAL LOW (ref 98–110)
Creat: 0.87 mg/dL (ref 0.60–1.00)
Glucose, Bld: 154 mg/dL — ABNORMAL HIGH (ref 65–99)
Potassium: 3.8 mmol/L (ref 3.5–5.3)
Sodium: 136 mmol/L (ref 135–146)
eGFR: 68 mL/min/{1.73_m2} (ref >=60)

## 2022-04-23 DIAGNOSIS — M549 Dorsalgia, unspecified: Secondary | ICD-10-CM | POA: Diagnosis not present

## 2022-04-26 ENCOUNTER — Telehealth: Payer: Self-pay

## 2022-04-26 ENCOUNTER — Other Ambulatory Visit: Payer: Self-pay | Admitting: Family Medicine

## 2022-04-26 MED ORDER — DIAZEPAM 5 MG PO TABS: 5.0000 mg | ORAL_TABLET | Freq: Every evening | ORAL | 0 refills | Status: DC | PRN

## 2022-04-26 NOTE — Telephone Encounter (Signed)
Pt  called in to request a refill of:  diazepam (VALIUM) 5 MG tablet [[id number]]    Order Details Dose: 5 mg Route: Oral Frequency: At bedtime PRN for sleep  Dispense Quantity: 30 tablet Refills: 0        Sig: Take 1 tablet (5 mg total) by mouth at bedtime as needed (sleep).   LOV: 04/15/22   PHARMACY: [id number] - [place], [place]

## 2022-04-28 NOTE — Progress Notes (Unsigned)
Cardiology Office Note Date:  04/30/2022  Patient ID:  Erika Lee, Erika Lee Aug 12, 1945, MRN [id number] PCP:  [name], MD  Electrophysiologist:  Dr. Rayann Heman   Chief Complaint:   overdue, CHF  History of Present Illness: Erika Lee is a 76 y.o. female with history of DM, GERD, HTN, AFlutter/AFib (s/p CTI and PVI ablation 2015), July 2020 suffered NSTEMI > echo noted preserved LVEF with new WMA > cath with normal coronaries, possibly had spasm.  She saw Dr. 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  Planned for 6 mo visit.  She has seen her PMD a few times in October, progressive edema, her lasix changed to bumex, , planned for labs, early follow up BNP only slightly elevated at 130, most recent echo noted preserved LVEF, felt to have HFpEF. UNNA boots were applied, metformin changed to Jardiance Early follow up with him again with improved edema, UNNA boots removed, no ongoing rash, no evidence of cellulitis/wounds Advised to wear support stockings.  Planned for Aldactone at her next visit if needed.  10/19 K+ 3.8 BUN/Creat 16/0.87  TODAY She is primary caregiver for her husband who unfortunately has suffered a catastrophic stroke, requiring full care.  She reports that she has always had just a little swelling at her ankles, but about 3 mo or so ago, started to notice more and more. Since the unna boots were removed she says they did look better, have been about the same since then, not better or worse. She has lost a few pounds though. No CP, denies any unusual SOB. She mentioned some SOB when she saw Dr. Rayann Heman in July, stopping the Toprol did not change it, but it isnt worse either. Not so SOB that she has to stop or rest. No palpitations or Afib No near syncope or syncope.  AFib/AAD hx Diagnosed  2008 Flecainide failed to maintain SR (2015) PVI ablation 07/16/2013  Device information: MDT ILR implanted 10/17/17, AFib surveillance  RRT Removed 04/25/21   Past Medical History:  Diagnosis Date   Anticoagulant long-term use    eliquis   Chronic sinusitis    GERD (gastroesophageal reflux disease)    Hemorrhoids    History of colitis 10/2016   campylobacter   Hyperlipidemia    Hypertension    Multinodular goiter    per pt has had 2 biopsy's both benign   NSTEMI (non-ST elevated myocardial infarction) ([place])    12/2018- normal coronary arteries on cath, poss coronary vasospasm.    Osteoarthritis    "all over"  and [facility] joint left shoulder   Paroxysmal atrial fibrillation [facility]) EP cardiologist--  [name]   s/p PVI by [name] 07/16/2013;   pt first dx 2008,  recurrence 2014 afib/flutter and tachy-brady   Rotator cuff tear, left    Shoulder impingement, left    Status post placement of implantable loop recorder    original placement 07-26-2014;  removal / replacement 10-17-2017  by dr allred   Type 2 diabetes mellitus ([place])    followed by pcp    Past Surgical History:  Procedure Laterality Date   ATRIAL FIBRILLATION ABLATION N/A 07/16/2013   PVI and CTI ablation by [name]   CARDIAC CATHETERIZATION     01/11/2019- Normal coronary arteries.    COLONOSCOPY  09/10/2011   Dr. Arnoldo Morale:  normal, 10 year follow up.   implantable loop recorder removal  04/25/2021   MDT LINQ removed by [name]   LEFT HEART CATH AND CORONARY ANGIOGRAPHY N/A 01/11/2019   Procedure: LEFT HEART CATH AND CORONARY ANGIOGRAPHY;  Surgeon: Lorretta Harp, MD;  Location: [place] CV LAB;  Service: Cardiovascular;  Laterality: N/A;   LESION REMOVAL N/A 05/03/2013   Procedure: EXCISION CYST, BACK;  Surgeon: Jamesetta So, MD;  Location: [facility];  Service: General;  Laterality: N/A;   LOOP RECORDER IMPLANT N/A 07/26/2014   Procedure: LOOP RECORDER IMPLANT;  Surgeon: Thompson Grayer, MD;  Location: [facility]  CATH LAB;  Service: Cardiovascular;  Laterality: N/A;   LOOP RECORDER INSERTION N/A 10/17/2017   MDT previously implanted ILR for afib management at RRT.  old device removed and new device placed by [name]   LOOP RECORDER REMOVAL N/A 10/17/2017   MDT ILR removed with new device subsequently replaced   SHOULDER ARTHROSCOPY WITH ROTATOR CUFF REPAIR AND SUBACROMIAL DECOMPRESSION Left 01/07/2019   Procedure: Left shoulder subacromial decompression, distal clavicle resection, extensive debridement,;  Surgeon: Nicholes Stairs, MD;  [facility];  Service: Orthopedics;  Laterality: Left;   TEE WITHOUT CARDIOVERSION N/A 07/15/2013   Procedure: TRANSESOPHAGEAL ECHOCARDIOGRAM (TEE);  Surgeon: Lelon Perla, MD;  Location: [facility] ENDOSCOPY;  Service: Cardiovascular;  Laterality: N/A;   TONSILLECTOMY  age 30   TRANSANAL HEMORRHOIDAL DEARTERIALIZATION N/A 09/22/2015   Procedure: TRANSANAL HEMORRHOIDAL LIGATION/PEXY EUA POSSIBLE HEMORRHOIDECTOMY ;  Surgeon: Michael Boston, MD;  Location: [facility] ORS;  Service: General;  Laterality: N/A;    Current Outpatient Medications  Medication Sig Dispense Refill   amLODipine (NORVASC) 5 MG tablet Take 0.5 tablets (2.5 mg total) by mouth daily. 45 tablet 2   bumetanide (BUMEX) 2 MG tablet Take 1 tablet (2 mg total) by mouth daily. Stop furosemide 30 tablet 1   diazepam (VALIUM) 5 MG tablet Take 1 tablet (5 mg total) by mouth at bedtime as needed (sleep). 30 tablet 0   diclofenac Sodium (VOLTAREN) 1 % GEL Apply 2 g topically 4 (four) times daily. 100 g 2   ELIQUIS 5 MG TABS tablet TAKE 1 TABLET(5 MG) BY MOUTH TWICE DAILY 60 tablet 3   esomeprazole (NEXIUM) 20 MG capsule Take 20 mg by mouth daily.      glucose blood (FREESTYLE PRECISION NEO TEST) test strip DX: E11.9 Use to check blood sugar daily 50 strip 11   HYDROcodone-acetaminophen (NORCO) 10-325 MG tablet Take 1 tablet by mouth every 6 (six) hours as needed. 120 tablet 0   Lancets  (FREESTYLE) lancets USE TO CHECK BLOOD SUGAR DAILY 100 each 2   lisinopril (ZESTRIL) 40 MG tablet TAKE 1 TABLET(40 MG) BY MOUTH DAILY 90 tablet 3   ondansetron (ZOFRAN) 4 MG tablet Take 1 tablet (4 mg total) by mouth every 8 (eight) hours as needed for nausea or vomiting. 20 tablet 0   Potassium Gluconate 550 MG TABS Take 550 mg by mouth at bedtime.      promethazine (PHENERGAN) 12.5 MG tablet TAKE 1 TABLET(12.5 MG) BY MOUTH EVERY 6 HOURS AS NEEDED FOR NAUSEA OR VOMITING 30 tablet 3   simvastatin (ZOCOR) 40 MG tablet TAKE 1 TABLET(40 MG) BY MOUTH AT BEDTIME 90 tablet 0   triamcinolone cream (KENALOG) 0.1 % Apply 1 Application topically 2 (two) times daily. 30 g 0   No current facility-administered medications for this visit.    Allergies:   Clindamycin/lincomycin; Doxycycline; Keflex [cephalexin]; Other; Sulfonic  acid (3,5-dibromo-4-h-ox-benz); Adhesive [tape]; Latex; and Sulfonamide derivatives   Social History:  The patient  reports that she quit smoking about 28 years ago. Her smoking use included cigarettes. She has been exposed to tobacco smoke. She has never used smokeless tobacco. She reports that she does not drink alcohol and does not use drugs.   Family History:  The patient's family history includes Kidney disease in her maternal grandfather; Other in her maternal grandfather; Stroke in her maternal grandmother; Stroke (age of onset: 46) in her mother.  ROS:  Please see the history of present illness.    All other systems are reviewed and otherwise negative.   PHYSICAL EXAM:  VS:  BP 130/70   Pulse (!) 52   Ht '5\' 5"'$  (1.651 m)   Wt 153 lb (69.4 kg)   LMP  (LMP Unknown)   SpO2 97%   BMI 25.46 kg/m  BMI: Body mass index is 25.46 kg/m. Well nourished, well developed, in no acute distress  HEENT: normocephalic, atraumatic  Neck: no JVD, carotid bruits or masses Cardiac:RRR; no significant murmurs, no rubs, or gallops Lungs:   CTA b/l, no wheezing, rhonchi or rales  Abd: soft,  nontender MS: no deformity or atrophy Ext:  she has compression hose, + edema, to about mid shin b/l Skin: warm and dry, no rash Neuro:  No gross deficits appreciated Psych: euthymic mood, full affect   EKG:  done today and reviewed by myself SB 52bpm    08/02/21: TTE  1. Left ventricular ejection fraction, by estimation, is 60 to 65%. The  left ventricle has normal function. The left ventricle has no regional  wall motion abnormalities. There is mild asymmetric left ventricular  hypertrophy of the basal-septal segment.  Left ventricular diastolic parameters are consistent with Grade I  diastolic dysfunction (impaired relaxation). The average left ventricular  global longitudinal strain is -21.8 %. The global longitudinal strain is  normal.   2. Right ventricular systolic function is normal. The right ventricular  size is normal. There is normal pulmonary artery systolic pressure.   3. Left atrial size was severely dilated.   4. The mitral valve is normal in structure. Trivial mitral valve  regurgitation. No evidence of mitral stenosis.   5. The aortic valve is tricuspid. Aortic valve regurgitation is trivial.  No aortic stenosis is present.   6. The inferior vena cava is normal in size with greater than 50%  respiratory variability, suggesting right atrial pressure of 3 mmHg.    01/11/2019: LHC IMPRESSION: Ms. Fults has normal coronary arteries and normal LV function.  The etiology of her chest pain, elevated troponins, EKG changes and wall motion and imagine 2D echo are still unclear.  She has normal LV function by ventriculography.  She may have had coronary spasm.  The sheath was removed and a TR band was placed on the right wrist to achieve patent hemostasis.  The patient left lab in stable condition.  She can be discharged home later today on her home dose of Eliquis oral anticoagulation.   01/10/2019: TTE IMPRESSIONS  1. Severe akinesis of the left ventricular, mid-apical  anteroseptal wall.   2. The left ventricle has low normal systolic function, with an ejection  fraction of 50-55%. The cavity size was normal. There is moderately  increased left ventricular wall thickness. Left ventricular diastolic  Doppler parameters are consistent with  impaired relaxation.   3. The right ventricle has normal systolic function. The cavity was  normal. There is  no increase in right ventricular wall thickness.  FINDINGS   Left Ventricle: The left ventricle has low normal systolic function, with  an ejection fraction of 50-55%. The cavity size was normal. There is  moderately increased left ventricular wall thickness. Left ventricular  diastolic Doppler parameters are  consistent with impaired relaxation. Severe akinesis of the left  ventricular, mid-apical anteroseptal wall.   Right Ventricle: The right ventricle has normal systolic function. The  cavity was normal. There is no increase in right ventricular wall  thickness.   Left Atrium: Left atrial size was normal in size.   Right Atrium: Right atrial size was normal in size. Right atrial pressure  is estimated at 10 mmHg.   Interatrial Septum: No atrial level shunt detected by color flow Doppler.   Pericardium: There is no evidence of pericardial effusion.   Mitral Valve: The mitral valve is normal in structure. Mitral valve  regurgitation is trivial by color flow Doppler.   Tricuspid Valve: The tricuspid valve is normal in structure. Tricuspid  valve regurgitation was not visualized by color flow Doppler.   Aortic Valve: The aortic valve is normal in structure. Aortic valve  regurgitation was not visualized by color flow Doppler.   Pulmonic Valve: The pulmonic valve was normal in structure. Pulmonic valve  regurgitation is not visualized by color flow Doppler.   Venous: The inferior vena cava is normal in size with greater than 50%  respiratory variability.    07/15/17: TTE Study Conclusions - Left  ventricle: Systolic function was normal. The   estimated ejection fraction was in the range of 55% to   60%. Wall motion was normal; there were no regional wall   motion abnormalities. - Aortic valve: No evidence of vegetation. - Mitral valve: No evidence of vegetation. Mild   regurgitation. - Left atrium: The atrium was mildly dilated. No evidence of   thrombus in the atrial cavity or appendage. - Atrial septum: No defect or patent foramen ovale was   identified. There was redundancy of the septum, with   borderline criteria for aneurysm. - Tricuspid valve: No evidence of vegetation.   Recent Labs: 07/26/2021: TSH 2.30 04/11/2022: ALT 10; Brain Natriuretic Peptide 131; Hemoglobin 12.8; Platelets 208 04/18/2022: BUN 16; Creat 0.87; Potassium 3.8; Sodium 136  10/25/2021: Cholesterol 132; HDL 53; LDL Cholesterol (Calc) 59; Total CHOL/HDL Ratio 2.5; Triglycerides 116   Estimated Creatinine Clearance: 52.2 mL/min (by C-G formula based on SCr of 0.87 mg/dL).   Wt Readings from Last 3 Encounters:  04/30/22 153 lb (69.4 kg)  04/18/22 154 lb (69.9 kg)  04/15/22 156 lb (70.8 kg)     Other studies reviewed: Additional studies/records reviewed today include: summarized above  ASSESSMENT AND PLAN:  1. Persistent AFib, Aflutter     S/p CTI and PVI ablation 2015      CHA2DS2Vasc is 4 , on Eliquis,  appropriately dosed  2. CHF ?HFpEF Will go ahead and update her echo [name] has plans in place to advance her therapies, she sees him soon  Await echo for any further recommendations  3. HTN     Looks ok    We discussed need for new EP MD,  will transition to Dr. Quentin Ore    Disposition: 3 mo, sooner if needed, pending her echo    Current medicines are reviewed at length with the patient today.  The patient did not have any concerns regarding medicines.  Venetia Night, PA-C 04/30/2022 1:12 PM  [address] [place]  [phone] (office)  [phone] (fax)

## 2022-04-30 ENCOUNTER — Telehealth: Payer: Self-pay | Admitting: Family Medicine

## 2022-04-30 ENCOUNTER — Encounter: Payer: Self-pay | Admitting: Physician Assistant

## 2022-04-30 ENCOUNTER — Ambulatory Visit: Payer: PPO | Attending: Physician Assistant | Admitting: Physician Assistant

## 2022-04-30 VITALS — BP 130/70 | HR 52 | Ht 65.0 in | Wt 153.0 lb

## 2022-04-30 DIAGNOSIS — I4892 Unspecified atrial flutter: Secondary | ICD-10-CM | POA: Diagnosis not present

## 2022-04-30 DIAGNOSIS — I10 Essential (primary) hypertension: Secondary | ICD-10-CM | POA: Diagnosis not present

## 2022-04-30 DIAGNOSIS — I509 Heart failure, unspecified: Secondary | ICD-10-CM | POA: Diagnosis not present

## 2022-04-30 DIAGNOSIS — I4819 Other persistent atrial fibrillation: Secondary | ICD-10-CM | POA: Diagnosis not present

## 2022-04-30 DIAGNOSIS — M549 Dorsalgia, unspecified: Secondary | ICD-10-CM | POA: Diagnosis not present

## 2022-04-30 NOTE — Patient Instructions (Signed)
Medication Instructions:   Your physician recommends that you continue on your current medications as directed. Please refer to the Current Medication list given to you today.   *If you need a refill on your cardiac medications before your next appointment, please call your pharmacy*   Lab Work: [place]    If you have labs (blood work) drawn today and your tests are completely normal, you will receive your results only by: [place] (if you have MyChart) OR A paper copy in the mail If you have any lab test that is abnormal or we need to change your treatment, we will call you to review the results.   Testing/Procedures:Your physician has requested that you have an echocardiogram. Echocardiography is a painless test that uses sound waves to create images of your heart. It provides your doctor with information about the size and shape of your heart and how well your heart's chambers and valves are working. This procedure takes approximately one hour. There are no restrictions for this procedure. Please do NOT wear cologne, perfume, aftershave, or lotions (deodorant is allowed). Please arrive 15 minutes prior to your appointment time.     Follow-Up: At [facility], you and your health needs are our priority.  As part of our continuing mission to provide you with exceptional heart care, we have created designated Provider Care Teams.  These Care Teams include your primary Cardiologist (physician) and Advanced Practice Providers (APPs -  Physician Assistants and Nurse Practitioners) who all work together to provide you with the care you need, when you need it.  We recommend signing up for the patient portal called "MyChart".  Sign up information is provided on this After Visit Summary.  MyChart is used to connect with patients for Virtual Visits (Telemedicine).  Patients are able to view lab/test results, encounter notes, upcoming appointments, etc.  Non-urgent  messages can be sent to your provider as well.   To learn more about what you can do with MyChart, go to [email or web address].    Your next appointment:   3 month(s)  The format for your next appointment:   In Person  Provider:   Lars Mage, MD   Other Instructions   Important Information About Sugar

## 2022-04-30 NOTE — Telephone Encounter (Signed)
Spoke with patient about rescheduling upcoming CPE; [name] on vacation week of 11/20. Patient will call back.

## 2022-05-09 ENCOUNTER — Ambulatory Visit (HOSPITAL_COMMUNITY): Payer: PPO | Attending: Physician Assistant

## 2022-05-09 DIAGNOSIS — I509 Heart failure, unspecified: Secondary | ICD-10-CM | POA: Insufficient documentation

## 2022-05-09 LAB — ECHOCARDIOGRAM COMPLETE
Area-P 1/2: 2.76 cm2
S' Lateral: 2.5 cm

## 2022-05-11 ENCOUNTER — Other Ambulatory Visit: Payer: Self-pay | Admitting: Family Medicine

## 2022-05-13 NOTE — Telephone Encounter (Signed)
Refilled 04/11/2022 #30 1 rf. Requested Prescriptions  Pending Prescriptions Disp Refills   bumetanide (BUMEX) 2 MG tablet [Pharmacy Med Name: BUMETANIDE '2MG'$  TABLETS] 30 tablet 1    Sig: TAKE 1 TABLET(2 MG) BY MOUTH DAILY. STOP FUROSEMIDE     Cardiovascular:  Diuretics - Loop Failed - 05/11/2022  8:01 AM      Failed - Cl in normal range and within 180 days    Chloride  Date Value Ref Range Status  04/18/2022 97 (L) 98 - 110 mmol/L Final         Failed - Mg Level in normal range and within 180 days    Magnesium  Date Value Ref Range Status  11/22/2016 1.4 (L) 1.7 - 2.4 mg/dL Final         Failed - Valid encounter within last 6 months    Recent Outpatient Visits           5 months ago Erroneous encounter - disregard   [place] [name], MD   6 months ago Controlled type 2 diabetes mellitus with complication, without long-term current use of insulin ([place])   [place] Pickard, Cammie Mcgee, MD   8 months ago [place] Dennard Schaumann, Cammie Mcgee, MD   8 months ago [place] Dennard Schaumann, Cammie Mcgee, MD   9 months ago Chronic fatigue   [place] Pickard, Cammie Mcgee, MD       Future Appointments             In 4 weeks Pickard, Cammie Mcgee, MD [place], [place]   In 2 months Vickie Epley, MD [facility], [facility]            Passed - K in normal range and within 180 days    Potassium  Date Value Ref Range Status  04/18/2022 3.8 3.5 - 5.3 mmol/L Final         Passed - Ca in normal range and within 180 days    Calcium  Date Value Ref Range Status  04/18/2022 10.1 8.6 - 10.4 mg/dL Final   Calcium, Ion  Date Value Ref Range Status  01/07/2019 1.28 1.15 - 1.40 mmol/L Final         Passed - Na in normal range and within 180 days    Sodium  Date Value Ref Range Status  04/18/2022 136  135 - 146 mmol/L Final         Passed - Cr in normal range and within 180 days    Creat  Date Value Ref Range Status  04/18/2022 0.87 0.60 - 1.00 mg/dL Final         Passed - Last BP in normal range    BP Readings from Last 1 Encounters:  04/30/22 130/70

## 2022-05-16 ENCOUNTER — Telehealth: Payer: Self-pay

## 2022-05-16 ENCOUNTER — Other Ambulatory Visit: Payer: PPO

## 2022-05-16 ENCOUNTER — Other Ambulatory Visit: Payer: Self-pay | Admitting: Family Medicine

## 2022-05-16 DIAGNOSIS — R300 Dysuria: Secondary | ICD-10-CM

## 2022-05-16 DIAGNOSIS — M549 Dorsalgia, unspecified: Secondary | ICD-10-CM | POA: Diagnosis not present

## 2022-05-16 LAB — URINALYSIS, ROUTINE W REFLEX MICROSCOPIC
Bilirubin Urine: NEGATIVE
Hgb urine dipstick: NEGATIVE
Ketones, ur: NEGATIVE
Nitrite: NEGATIVE
Protein, ur: NEGATIVE
Specific Gravity, Urine: 1.015 (ref 1.001–1.035)
pH: 6 (ref 5.0–8.0)

## 2022-05-16 LAB — MICROSCOPIC MESSAGE

## 2022-05-16 MED ORDER — CEPHALEXIN 500 MG PO CAPS: 500.0000 mg | ORAL_CAPSULE | Freq: Three times a day (TID) | ORAL | 0 refills | Status: DC

## 2022-05-16 NOTE — Telephone Encounter (Signed)
Pt came into office wanting to leave a urine sample. Pt thinks she may have possible UTI. Please advise  Cb#: [phone]

## 2022-05-18 ENCOUNTER — Other Ambulatory Visit: Payer: Self-pay | Admitting: Family Medicine

## 2022-05-20 NOTE — Telephone Encounter (Signed)
Requested Prescriptions  Pending Prescriptions Disp Refills   simvastatin (ZOCOR) 40 MG tablet [Pharmacy Med Name: SIMVASTATIN '40MG'$  TABLETS] 90 tablet 1    Sig: TAKE 1 TABLET(40 MG) BY MOUTH AT BEDTIME     Cardiovascular:  Antilipid - Statins Failed - 05/18/2022  8:05 AM      Failed - Lipid Panel in normal range within the last 12 months    Cholesterol  Date Value Ref Range Status  10/25/2021 132 <200 mg/dL Final   LDL Cholesterol (Calc)  Date Value Ref Range Status  10/25/2021 59 mg/dL (calc) Final    Comment:    Reference range: <100 . Desirable range <100 mg/dL for primary prevention;   <70 mg/dL for patients with CHD or diabetic patients  with > or = 2 CHD risk factors. Marland Kitchen LDL-C is now calculated using the Martin-Hopkins  calculation, which is a validated novel method providing  better accuracy than the Friedewald equation in the  estimation of LDL-C.  Cresenciano Genre et al. Annamaria Helling. 5852;778(24): 2061-2068  (http://education.[employer].com/faq/FAQ164)    HDL  Date Value Ref Range Status  10/25/2021 53 > OR = 50 mg/dL Final   Triglycerides  Date Value Ref Range Status  10/25/2021 116 <150 mg/dL Final         Passed - Patient is not pregnant      Passed - Valid encounter within last 12 months    Recent Outpatient Visits           6 months ago Erroneous encounter - disregard   [place] [name], MD   6 months ago Controlled type 2 diabetes mellitus with complication, without long-term current use of insulin ([place])   [place] Pickard, Cammie Mcgee, MD   8 months ago [place] Dennard Schaumann, Cammie Mcgee, MD   8 months ago [place] [name], MD   9 months ago Chronic fatigue   [place] Pickard, Cammie Mcgee, MD       Future Appointments             In 3 weeks Pickard, Cammie Mcgee, MD [place], [place]   In 2 months Vickie Epley, MD [facility] Of [place]. [facility], [facility]

## 2022-05-21 ENCOUNTER — Encounter: Payer: PPO | Admitting: Family Medicine

## 2022-05-21 ENCOUNTER — Ambulatory Visit (INDEPENDENT_AMBULATORY_CARE_PROVIDER_SITE_OTHER): Payer: PPO

## 2022-05-21 VITALS — Ht 65.0 in | Wt 154.0 lb

## 2022-05-21 DIAGNOSIS — M9901 Segmental and somatic dysfunction of cervical region: Secondary | ICD-10-CM | POA: Diagnosis not present

## 2022-05-21 DIAGNOSIS — M9902 Segmental and somatic dysfunction of thoracic region: Secondary | ICD-10-CM | POA: Diagnosis not present

## 2022-05-21 DIAGNOSIS — M546 Pain in thoracic spine: Secondary | ICD-10-CM | POA: Diagnosis not present

## 2022-05-21 DIAGNOSIS — Z0000 Encounter for general adult medical examination without abnormal findings: Secondary | ICD-10-CM

## 2022-05-21 DIAGNOSIS — M542 Cervicalgia: Secondary | ICD-10-CM | POA: Diagnosis not present

## 2022-05-21 DIAGNOSIS — M9903 Segmental and somatic dysfunction of lumbar region: Secondary | ICD-10-CM | POA: Diagnosis not present

## 2022-05-21 DIAGNOSIS — M62830 Muscle spasm of back: Secondary | ICD-10-CM | POA: Diagnosis not present

## 2022-05-21 NOTE — Progress Notes (Signed)
Subjective:   Erika Lee is a 76 y.o. female who presents for Medicare Annual (Subsequent) preventive examination.  I connected with  Erika Lee on 05/21/22 by a audio enabled telemedicine application and verified that I am speaking with the correct person using two identifiers.  Patient Location: Home  Provider Location: Office/Clinic  I discussed the limitations of evaluation and management by telemedicine. The patient expressed understanding and agreed to proceed.   Review of Systems     Cardiac Risk Factors include: advanced age (>90mn, >>58women);diabetes mellitus;dyslipidemia;hypertension;smoking/ tobacco exposure;sedentary lifestyle     Objective:    Today's Vitals   05/21/22 1033  Weight: 154 lb (69.9 kg)  Height: '5\' 5"'$  (1.651 m)   Body mass index is 25.63 kg/m.     05/21/2022   11:13 AM 03/14/2022    4:43 PM 12/28/2020   12:02 PM 01/09/2019   12:52 PM 08/25/2018    2:47 PM 10/17/2017    6:32 AM 11/21/2016    6:00 AM  Advanced Directives  Does Patient Have a Medical Advance Directive? No No No No No No No  Does patient want to make changes to medical advance directive?  No - Patient declined       Would patient like information on creating a medical advance directive? Yes (MAU/Ambulatory/Procedural Areas - Information given)  No - Patient declined No - Patient declined No - Patient declined Yes (Inpatient - patient defers creating a medical advance directive at this time) No - Patient declined    Current Medications (verified) Outpatient Encounter Medications as of 05/21/2022  Medication Sig   amLODipine (NORVASC) 5 MG tablet Take 0.5 tablets (2.5 mg total) by mouth daily.   bumetanide (BUMEX) 2 MG tablet Take 1 tablet (2 mg total) by mouth daily. Stop furosemide   cephALEXin (KEFLEX) 500 MG capsule Take 1 capsule (500 mg total) by mouth 3 (three) times daily.   diazepam (VALIUM) 5 MG tablet Take 1 tablet (5 mg total) by mouth at bedtime as needed (sleep).    diclofenac Sodium (VOLTAREN) 1 % GEL Apply 2 g topically 4 (four) times daily.   ELIQUIS 5 MG TABS tablet TAKE 1 TABLET(5 MG) BY MOUTH TWICE DAILY   empagliflozin (JARDIANCE) 10 MG TABS tablet Take by mouth daily.   esomeprazole (NEXIUM) 20 MG capsule Take 20 mg by mouth daily.    HYDROcodone-acetaminophen (NORCO) 10-325 MG tablet Take 1 tablet by mouth every 6 (six) hours as needed.   lisinopril (ZESTRIL) 40 MG tablet TAKE 1 TABLET(40 MG) BY MOUTH DAILY   ondansetron (ZOFRAN) 4 MG tablet Take 1 tablet (4 mg total) by mouth every 8 (eight) hours as needed for nausea or vomiting.   Potassium Gluconate 550 MG TABS Take 550 mg by mouth at bedtime.    promethazine (PHENERGAN) 12.5 MG tablet TAKE 1 TABLET(12.5 MG) BY MOUTH EVERY 6 HOURS AS NEEDED FOR NAUSEA OR VOMITING   simvastatin (ZOCOR) 40 MG tablet TAKE 1 TABLET(40 MG) BY MOUTH AT BEDTIME   triamcinolone cream (KENALOG) 0.1 % Apply 1 Application topically 2 (two) times daily.   glucose blood (FREESTYLE PRECISION NEO TEST) test strip DX: E11.9 Use to check blood sugar daily (Patient not taking: Reported on 05/21/2022)   Lancets (FREESTYLE) lancets USE TO CHECK BLOOD SUGAR DAILY (Patient not taking: Reported on 05/21/2022)   No facility-administered encounter medications on file as of 05/21/2022.    Allergies (verified) Clindamycin/lincomycin; Doxycycline; Keflex [cephalexin]; Other; Sulfonic acid (3,5-dibromo-4-h-ox-benz); Adhesive [tape]; Latex; and  Sulfonamide derivatives   History: Past Medical History:  Diagnosis Date   Anticoagulant long-term use    eliquis   Chronic sinusitis    GERD (gastroesophageal reflux disease)    Hemorrhoids    History of colitis 10/2016   campylobacter   Hyperlipidemia    Hypertension    Multinodular goiter    per pt has had 2 biopsy's both benign   NSTEMI (non-ST elevated myocardial infarction) ([place])    12/2018- normal coronary arteries on cath, poss coronary vasospasm.    Osteoarthritis    "all  over"  and [facility] joint left shoulder   Paroxysmal atrial fibrillation [facility]) EP cardiologist--  [name]   s/p PVI by [name] 07/16/2013;   pt first dx 2008,  recurrence 2014 afib/flutter and tachy-brady   Rotator cuff tear, left    Shoulder impingement, left    Status post placement of implantable loop recorder    original placement 07-26-2014;  removal / replacement 10-17-2017  by dr allred   Type 2 diabetes mellitus ([place])    followed by pcp   Past Surgical History:  Procedure Laterality Date   ATRIAL FIBRILLATION ABLATION N/A 07/16/2013   PVI and CTI ablation by [name]   CARDIAC CATHETERIZATION     01/11/2019- Normal coronary arteries.    COLONOSCOPY  09/10/2011   Dr. Arnoldo Morale: normal, 10 year follow up.   implantable loop recorder removal  04/25/2021   MDT LINQ removed by [name]   LEFT HEART CATH AND CORONARY ANGIOGRAPHY N/A 01/11/2019   Procedure: LEFT HEART CATH AND CORONARY ANGIOGRAPHY;  Surgeon: Lorretta Harp, MD;  Location: [place];  Service: Cardiovascular;  Laterality: N/A;   LESION REMOVAL N/A 05/03/2013   Procedure: EXCISION CYST, BACK;  Surgeon: Jamesetta So, MD;  Location: [facility];  Service: General;  Laterality: N/A;   LOOP RECORDER IMPLANT N/A 07/26/2014   Procedure: LOOP RECORDER IMPLANT;  Surgeon: Thompson Grayer, MD;  Location: [facility] CATH LAB;  Service: Cardiovascular;  Laterality: N/A;   LOOP RECORDER INSERTION N/A 10/17/2017   MDT previously implanted ILR for afib management at [facility].  old device removed and new device placed by [name]   LOOP RECORDER REMOVAL N/A 10/17/2017   MDT ILR removed with new device subsequently replaced   SHOULDER ARTHROSCOPY WITH ROTATOR CUFF REPAIR AND SUBACROMIAL DECOMPRESSION Left 01/07/2019   Procedure: Left shoulder subacromial decompression, distal clavicle resection, extensive debridement,;  Surgeon: Nicholes Stairs, MD;  Location: [facility];  Service: Orthopedics;  Laterality: Left;   TEE  WITHOUT CARDIOVERSION N/A 07/15/2013   Procedure: TRANSESOPHAGEAL ECHOCARDIOGRAM (TEE);  Surgeon: Lelon Perla, MD;  Location: [facility] ENDOSCOPY;  Service: Cardiovascular;  Laterality: N/A;   TONSILLECTOMY  age 29   TRANSANAL HEMORRHOIDAL DEARTERIALIZATION N/A 09/22/2015   Procedure: TRANSANAL HEMORRHOIDAL LIGATION/PEXY EUA POSSIBLE HEMORRHOIDECTOMY ;  Surgeon: Michael Boston, MD;  Location: [facility];  Service: General;  Laterality: N/A;   Family History  Problem Relation Age of Onset   Stroke Mother 12   Stroke Maternal Grandmother        stroke   Other Maternal Grandfather        "heart dropsy" kidney issues   Kidney disease Maternal Grandfather    Colon cancer Neg Hx    Social History   Socioeconomic History   Marital status: Married    Spouse name: Wendall Stade   Number of children: 2   Years of education: 12   Highest education level: 12th  grade  Occupational History   Occupation: Pensions consultant: RETIRED    Comment: Full time  Tobacco Use   Smoking status: Former    Years: 34.00    Types: Cigarettes    Quit date: 01/02/1994    Years since quitting: 28.4    Passive exposure: Past   Smokeless tobacco: Never  Vaping Use   Vaping Use: Never used  Substance and Sexual Activity   Alcohol use: No   Drug use: No   Sexual activity: Not Currently    Partners: Male  Other Topics Concern   Not on file  Social History Narrative   Lives with spouse in [place]   One child deceased from homicide at age 49    Social Determinants of Health   Financial Resource Strain: [place]  (05/21/2022)   Overall Financial Resource Strain (CARDIA)    Difficulty of Paying Living Expenses: Not hard at all  Food Insecurity: No Food Insecurity (05/21/2022)   Hunger Vital Sign    Worried About Running Out of Food in the Last Year: Never true    Ran Out of Food in the Last Year: Never true  Transportation Needs: No Transportation Needs (05/21/2022)   PRAPARE - Armed forces logistics/support/administrative officer (Medical): No    Lack of Transportation (Non-Medical): No  Physical Activity: Inactive (05/21/2022)   Exercise Vital Sign    Days of Exercise per Week: 0 days    Minutes of Exercise per Session: 0 min  Stress: Stress Concern Present (05/21/2022)   Fernando Salinas    Feeling of Stress : To some extent  Social Connections: Moderately Integrated (05/21/2022)   Social Connection and Isolation Panel [NHANES]    Frequency of Communication with Friends and Family: More than three times a week    Frequency of Social Gatherings with Friends and Family: Once a week    Attends Religious Services: 1 to 4 times per year    Active Member of [employer] or Organizations: No    Attends Music therapist: Never    Marital Status: Married    Tobacco Counseling Counseling given: Not Answered   Clinical Intake:  Pre-visit preparation completed: Yes  Pain : No/denies pain  Diabetes: Yes CBG done?: No Did pt. bring in CBG monitor from home?: No  Diabetic?Yes Nutrition Risk Assessment:  Has the patient had any N/V/D within the last 2 months?  No  Does the patient have any non-healing wounds?  No  Has the patient had any unintentional weight loss or weight gain?  No   Diabetes:  Is the patient diabetic?  Yes  If diabetic, was a CBG obtained today?  No  Did the patient bring in their glucometer from home?  No  How often do you monitor your CBG's? Does not currently monitor.   Financial Strains and Diabetes Management:  Are you having any financial strains with the device, your supplies or your medication? No .  Does the patient want to be seen by Chronic Care Management for management of their diabetes?  No  Would the patient like to be referred to a Nutritionist or for Diabetic Management?  No   Diabetic Exams:  Diabetic Eye Exam: Overdue for diabetic eye exam. Pt has been advised about the  importance in completing this exam. Patient advised to call and schedule an eye exam. Diabetic Foot Exam: Overdue, Pt has been advised about the importance in  completing this exam. Pt is scheduled for diabetic foot exam on at next office visit .  Interpreter Needed?: No  Information entered by :: Denman George LPN   Activities of Daily Living    05/21/2022   11:13 AM 03/14/2022    4:42 PM  In your present state of health, do you have any difficulty performing the following activities:  Hearing? 0 0  Vision? 0 0  Difficulty concentrating or making decisions? 0 0  Walking or climbing stairs? 0 0  Dressing or bathing? 0 0  Doing errands, shopping? 0 0  Preparing Food and eating ? N N  Using the Toilet? N N  In the past six months, have you accidently leaked urine? N N  Do you have problems with loss of bowel control? N N  Managing your Medications? N N  Managing your Finances? N N  Housekeeping or managing your Housekeeping? N N    Patient Care Team: [name], MD as PCP - General (Family Medicine) Thompson Grayer, MD as PCP - Cardiology (Cardiology) Michael Boston, MD as Consulting Physician (General Surgery) Thompson Grayer, MD as Consulting Physician (Cardiology) Aviva Signs, MD as Consulting Physician (Gastroenterology) Gala Romney Cristopher Estimable, MD as Consulting Physician (Gastroenterology) Edythe Clarity, [facility] as Pharmacist (Pharmacist)  Indicate any recent Medical Services you may have received from other than [facility] providers in the past year (date may be approximate).     Assessment:   This is a routine wellness examination for [place].  Hearing/Vision screen Hearing Screening - Comments:: No concerns  Vision Screening - Comments:: Wears rx glasses - up to date with routine eye exams with   Reading glasses only   Dietary issues and exercise activities discussed: Current Exercise Habits: The patient does not participate in regular exercise at present   Goals Addressed              This Visit's Progress    High Blood Pressure - < 130/80   On track    [place] (see longitudinal plan of care for additional care plan information)  Current Barriers:  Uncontrolled hypertension, complicated by diabetes, hyperlipidemia, and afib. Current antihypertensive regimen: lisinopril '40mg'$  daily, Toprol XL '25mg'$  one-half tablet daily, Spironolactone '25mg'$  daily, Amlodipine 2.'5mg'$  daily Previous antihypertensives tried: none noted Last practice recorded BP readings:  BP Readings from Last 3 Encounters:  01/18/20 124/68  01/06/20 120/66  12/13/19 132/75  Current home BP readings: 120-130/60-70  Pharmacist Clinical Goal(s):  Over the next 180 days, patient will work with PharmD and providers to optimize antihypertensive regimen  Interventions: Inter-disciplinary care team collaboration (see longitudinal plan of care) Comprehensive medication review performed; medication list updated in the electronic medical record.   Patient Self Care Activities:  Patient will continue to check BP twice daily , document, and provide at future appointments Patient will focus on medication adherence by using a pill box. Over the next 180 days patient will follow up with providers on home BP readings.  Please see past updates related to this goal by clicking on the "Past Updates" button in the selected goal         Depression Screen    05/21/2022   11:06 AM 03/14/2022    4:40 PM 12/28/2020   12:04 PM 07/24/2020   12:12 PM 10/19/2019    2:27 PM  PHQ 2/9 Scores  PHQ - 2 Score 0 0 0 0 0    Fall Risk    05/21/2022   10:36 AM 03/14/2022  4:42 PM 12/28/2020   12:03 PM 07/24/2020   12:11 PM 10/19/2019    2:28 PM  Fall Risk   Falls in the past year? 0 0 1 0 0  Number falls in past yr: 0 0 0 0   Injury with Fall? 0 0 1 0 0  Risk for fall due to : No Fall Risks No Fall Risks No Fall Risks    Follow up Falls evaluation completed;Education provided;Falls prevention  discussed Falls evaluation completed;Education provided;Falls prevention discussed Falls evaluation completed;Falls prevention discussed;Education provided Falls evaluation completed Falls evaluation completed    FALL RISK PREVENTION PERTAINING TO THE HOME:  Any stairs in or around the home? Yes  If so, are there any without handrails? Yes  Home free of loose throw rugs in walkways, pet beds, electrical cords, etc? Yes  Adequate lighting in your home to reduce risk of falls? Yes   ASSISTIVE DEVICES UTILIZED TO PREVENT FALLS:  Life alert? No  Use of a cane, walker or w/c? No  Grab bars in the bathroom? Yes  Shower chair or bench in shower? Yes  Elevated toilet seat or a handicapped toilet? Yes   TIMED UP AND GO:  Was the test performed? No .  Length of time to ambulate 10 feet: telephonic visit   Cognitive Function:        05/21/2022   11:13 AM  6CIT Screen  What Year? 0 points  What month? 0 points  What time? 0 points  Count back from 20 0 points  Months in reverse 0 points  Repeat phrase 0 points  Total Score 0 points    Immunizations Immunization History  Administered Date(s) Administered   Fluad Quad(high Dose 65+) 04/20/2020, 03/23/2021, 04/15/2022   Influenza Split 04/16/2017, 04/23/2018   Influenza, High Dose Seasonal PF 04/23/2018, 03/02/2019   PFIZER(Purple Top)SARS-COV-2 Vaccination 08/04/2019, 08/28/2019   Pneumococcal Conjugate-13 06/02/2018   Pneumococcal Polysaccharide-23 07/16/2016    TDAP status: Up to date  Flu Vaccine status: Up to date  Pneumococcal vaccine status: Up to date  Covid-19 vaccine status: Information provided on how to obtain vaccines.   Qualifies for Shingles Vaccine? Yes   Zostavax completed No   Shingrix Completed?: Yes  Screening Tests Health Maintenance  Topic Date Due   COVID-19 Vaccine (3 - Pfizer risk series) 06/30/2022 (Originally 09/25/2019)   FOOT EXAM  05/01/2023 (Originally 01/22/2022)   OPHTHALMOLOGY EXAM   05/01/2023 (Originally 11/09/1955)   Diabetic kidney evaluation - Urine ACR  06/30/2023 (Originally 07/20/2020)   Hepatitis C Screening  06/30/2023 (Originally 11/09/1963)   Zoster Vaccines- Shingrix (1 of 2) 07/01/2023 (Originally 11/10/1964)   HEMOGLOBIN A1C  10/11/2022   Diabetic kidney evaluation - GFR measurement  04/19/2023   Medicare Annual Wellness (AWV)  05/22/2023   Pneumonia Vaccine 15+ Years old  Completed   INFLUENZA VACCINE  Completed   DEXA SCAN  Completed   HPV VACCINES  Aged Out   COLONOSCOPY (Pts 45-20yr Insurance coverage will need to be confirmed)  Discontinued    Health Maintenance  There are no preventive care reminders to display for this patient.   Colorectal cancer screening: No longer required.   Mammogram status: No longer required due to age.  Bone Density status: Completed 11/30/12. Results reflect: Bone density results: NORMAL. Repeat every 10 years.  Lung Cancer Screening: (Low Dose CT Chest recommended if Age 78-80years, 30 pack-year currently smoking OR have quit w/in 15years.) does not qualify.   Lung Cancer Screening Referral: n/a  Additional Screening:  Hepatitis C Screening: does qualify; Completed postponed  Vision Screening: Recommended annual ophthalmology exams for early detection of glaucoma and other disorders of the eye. Is the patient up to date with their annual eye exam?  No  Who is the provider or what is the name of the office in which the patient attends annual eye exams? [facility]  If pt is not established with a provider, would they like to be referred to a provider to establish care? No .   Dental Screening: Recommended annual dental exams for proper oral hygiene  Community Resource Referral / Chronic Care Management: CRR required this visit?  No   CCM required this visit?  No      Plan:     I have personally reviewed and noted the following in the patient's chart:   Medical and social history Use of alcohol,  tobacco or illicit drugs  Current medications and supplements including opioid prescriptions. Patient is currently taking opioid prescriptions. Information provided to patient regarding non-opioid alternatives. Patient advised to discuss non-opioid treatment plan with their provider. Functional ability and status Nutritional status Physical activity Advanced directives List of other physicians Hospitalizations, surgeries, and ER visits in previous 12 months Vitals Screenings to include cognitive, depression, and falls Referrals and appointments  In addition, I have reviewed and discussed with patient certain preventive protocols, quality metrics, and best practice recommendations. A written personalized care plan for preventive services as well as general preventive health recommendations were provided to patient.     Vanetta Mulders, [place]   75/03/[id number]   Due to this being a virtual visit, the after visit summary with patients personalized plan was offered to patient via mail or my-chart. Per request, patient was mailed a copy of AVS  Nurse Notes: No concerns

## 2022-05-21 NOTE — Patient Instructions (Addendum)
Erika Lee , Thank you for taking time to come for your Medicare Wellness Visit. I appreciate your ongoing commitment to your health goals. Please review the following plan we discussed and let me know if I can assist you in the future.   These are the goals we discussed:  Goals      Diabetes - A1c < 7     CARE PLAN ENTRY (see longitudinal plan of care for additional care plan information)  Current Barriers:  Diabetes: controlled; complicated by chronic medical conditions including hypertension and hyperlipidemia. Lab Results  Component Value Date/Time   HGBA1C 6.8 (H) 01/18/2020 02:34 PM   HGBA1C 6.3 (H) 10/19/2019 03:02 PM   GFR 72.30 07/22/2013 01:07 PM   GFR 85.20 07/07/2013 09:39 AM   MICROALBUR 0.5 07/21/2019 10:55 AM   Lab Results  Component Value Date   CREATININE 0.74 10/19/2019   CREATININE 0.75 07/21/2019   CREATININE 0.70 04/26/2019  Current antihyperglycemic regimen: metformin '500mg'$  daily Denies hypoglycemic symptoms, including dizziness, lightheadedness, shaking, sweating Denies hyperglycemic symptoms, including polyuria, polydipsia, polyphagia, nocturia, blurred vision, neuropathy Current exercise: minimal Current blood glucose readings: reported AM blood sugard of 111-114  Pharmacist Clinical Goal(s):  Over the next 180 days, patient will work with PharmD and primary care provider to optimize medication related to hyperglycemia.  Interventions: Comprehensive medication review performed, medication list updated in electronic medical record Inter-disciplinary care team collaboration (see longitudinal plan of care) Continue current therapy.  Patient Self Care Activities:  Patient will check blood glucose daily, document, and provide at future appointments Over the next 180 days: Work on diet limiting carbohydrates and sugars Patient will focus on medication adherence by pill box Patient will take medications as prescribed Patient will contact provider with  any episodes of hypoglycemia Patient will report any questions or concerns to provider   Initial goal documentation       High Blood Pressure - < 130/80     CARE PLAN ENTRY (see longitudinal plan of care for additional care plan information)  Current Barriers:  Uncontrolled hypertension, complicated by diabetes, hyperlipidemia, and afib. Current antihypertensive regimen: lisinopril '40mg'$  daily, Toprol XL '25mg'$  one-half tablet daily, Spironolactone '25mg'$  daily, Amlodipine 2.'5mg'$  daily Previous antihypertensives tried: none noted Last practice recorded BP readings:  BP Readings from Last 3 Encounters:  01/18/20 124/68  01/06/20 120/66  12/13/19 132/75  Current home BP readings: 120-130/60-70  Pharmacist Clinical Goal(s):  Over the next 180 days, patient will work with PharmD and providers to optimize antihypertensive regimen  Interventions: Inter-disciplinary care team collaboration (see longitudinal plan of care) Comprehensive medication review performed; medication list updated in the electronic medical record.   Patient Self Care Activities:  Patient will continue to check BP twice daily , document, and provide at future appointments Patient will focus on medication adherence by using a pill box. Over the next 180 days patient will follow up with providers on home BP readings.  Please see past updates related to this goal by clicking on the "Past Updates" button in the selected goal       High Cholesterol - LDL < 70     CARE PLAN ENTRY (see longitudinal plan of care for additional care plan information)  Current Barriers:  Uncontrolled hyperlipidemia, complicated by hypertension, diabetes. Current antihyperlipidemic regimen: simvastatin '40mg'$  daily Previous antihyperlipidemic medications tried none noted Most recent lipid panel:     Component Value Date/Time   CHOL 146 07/21/2019 1055   TRIG 123 07/21/2019 1055   HDL 50  07/21/2019 1055   CHOLHDL 2.9 07/21/2019 1055    VLDL 16 01/10/2019 0308   LDLCALC 76 07/21/2019 1055   Pharmacist Clinical Goal(s):  Over the next 90 days, patient will work with PharmD and providers towards optimized antihyperlipidemic therapy  Interventions: Comprehensive medication review performed; medication list updated in electronic medical record.  Inter-disciplinary care team collaboration (see longitudinal plan of care) Continue current therapy. Diet modifications including sweets at night.  Patient Self Care Activities:  Patient will focus on medication adherence by using a pill box. Over the next 90 days patient will focus on her night time snacks.  She will try to limit ice cream consumption to only a few nights per week.  Initial goal documentation         This is a list of the screening recommended for you and due dates:  Health Maintenance  Topic Date Due   COVID-19 Vaccine (3 - Pfizer risk series) 06/30/2022*   Complete foot exam   05/01/2023*   Eye exam for diabetics  05/01/2023*   Yearly kidney health urinalysis for diabetes  06/30/2023*   Hepatitis C Screening: USPSTF Recommendation to screen - Ages 18-79 yo.  06/30/2023*   Zoster (Shingles) Vaccine (1 of 2) 07/01/2023*   Hemoglobin A1C  10/11/2022   Yearly kidney function blood test for diabetes  04/19/2023   Medicare Annual Wellness Visit  05/22/2023   Pneumonia Vaccine  Completed   Flu Shot  Completed   DEXA scan (bone density measurement)  Completed   HPV Vaccine  Aged Out   Colon Cancer Screening  Discontinued  *Topic was postponed. The date shown is not the original due date.    Advanced directives: Advance directive discussed with you today. I have provided a copy for you to complete at home and have notarized. Once this is complete please bring a copy in to our office so we can scan it into your chart.   Conditions/risks identified: Aim for 30 minutes of exercise or brisk walking, 6-8 glasses of water, and 5 servings of fruits and vegetables  each day.   Next appointment: Follow up in one year for your annual wellness visit    Preventive Care 65 Years and Older, Female Preventive care refers to lifestyle choices and visits with your health care provider that can promote health and wellness. What does preventive care include? A yearly physical exam. This is also called an annual well check. Dental exams once or twice a year. Routine eye exams. Ask your health care provider how often you should have your eyes checked. Personal lifestyle choices, including: Daily care of your teeth and gums. Regular physical activity. Eating a healthy diet. Avoiding tobacco and drug use. Limiting alcohol use. Practicing safe sex. Taking low-dose aspirin every day. Taking vitamin and mineral supplements as recommended by your health care provider. What happens during an annual well check? The services and screenings done by your health care provider during your annual well check will depend on your age, overall health, lifestyle risk factors, and family history of disease. Counseling  Your health care provider may ask you questions about your: Alcohol use. Tobacco use. Drug use. Emotional well-being. Home and relationship well-being. Sexual activity. Eating habits. History of falls. Memory and ability to understand (cognition). Work and work Statistician. Reproductive health. Screening  You may have the following tests or measurements: Height, weight, and BMI. Blood pressure. Lipid and cholesterol levels. These may be checked every 5 years, or more frequently if you are  over 7 years old. Skin check. Lung cancer screening. You may have this screening every year starting at age 12 if you have a 30-pack-year history of smoking and currently smoke or have quit within the past 15 years. Fecal occult blood test (FOBT) of the stool. You may have this test every year starting at age 33. Flexible sigmoidoscopy or colonoscopy. You may have a  sigmoidoscopy every 5 years or a colonoscopy every 10 years starting at age 5. Hepatitis C blood test. Hepatitis B blood test. Sexually transmitted disease (STD) testing. Diabetes screening. This is done by checking your blood sugar (glucose) after you have not eaten for a while (fasting). You may have this done every 1-3 years. Bone density scan. This is done to screen for osteoporosis. You may have this done starting at age 56. Mammogram. This may be done every 1-2 years. Talk to your health care provider about how often you should have regular mammograms. Talk with your health care provider about your test results, treatment options, and if necessary, the need for more tests. Vaccines  Your health care provider may recommend certain vaccines, such as: Influenza vaccine. This is recommended every year. Tetanus, diphtheria, and acellular pertussis (Tdap, Td) vaccine. You may need a Td booster every 10 years. Zoster vaccine. You may need this after age 36. Pneumococcal 13-valent conjugate (PCV13) vaccine. One dose is recommended after age 60. Pneumococcal polysaccharide (PPSV23) vaccine. One dose is recommended after age 17. Talk to your health care provider about which screenings and vaccines you need and how often you need them. This information is not intended to replace advice given to you by your health care provider. Make sure you discuss any questions you have with your health care provider. Document Released: 07/14/2015 Document Revised: 03/06/2016 Document Reviewed: 04/18/2015 Elsevier Interactive Patient Education  2017 [employer] Prevention in the Home Falls can cause injuries. They can happen to people of all ages. There are many things you can do to make your home safe and to help prevent falls. What can I do on the outside of my home? Regularly fix the edges of walkways and driveways and fix any cracks. Remove anything that might make you trip as you walk through a  door, such as a raised step or threshold. Trim any bushes or trees on the path to your home. Use bright outdoor lighting. Clear any walking paths of anything that might make someone trip, such as rocks or tools. Regularly check to see if handrails are loose or broken. Make sure that both sides of any steps have handrails. Any raised decks and porches should have guardrails on the edges. Have any leaves, snow, or ice cleared regularly. Use sand or salt on walking paths during winter. Clean up any spills in your garage right away. This includes oil or grease spills. What can I do in the bathroom? Use night lights. Install grab bars by the toilet and in the tub and shower. Do not use towel bars as grab bars. Use non-skid mats or decals in the tub or shower. If you need to sit down in the shower, use a plastic, non-slip stool. Keep the floor dry. Clean up any water that spills on the floor as soon as it happens. Remove soap buildup in the tub or shower regularly. Attach bath mats securely with double-sided non-slip rug tape. Do not have throw rugs and other things on the floor that can make you trip. What can I do in the  bedroom? Use night lights. Make sure that you have a light by your bed that is easy to reach. Do not use any sheets or blankets that are too big for your bed. They should not hang down onto the floor. Have a firm chair that has side arms. You can use this for support while you get dressed. Do not have throw rugs and other things on the floor that can make you trip. What can I do in the kitchen? Clean up any spills right away. Avoid walking on wet floors. Keep items that you use a lot in easy-to-reach places. If you need to reach something above you, use a strong step stool that has a grab bar. Keep electrical cords out of the way. Do not use floor polish or wax that makes floors slippery. If you must use wax, use non-skid floor wax. Do not have throw rugs and other things  on the floor that can make you trip. What can I do with my stairs? Do not leave any items on the stairs. Make sure that there are handrails on both sides of the stairs and use them. Fix handrails that are broken or loose. Make sure that handrails are as long as the stairways. Check any carpeting to make sure that it is firmly attached to the stairs. Fix any carpet that is loose or worn. Avoid having throw rugs at the top or bottom of the stairs. If you do have throw rugs, attach them to the floor with carpet tape. Make sure that you have a light switch at the top of the stairs and the bottom of the stairs. If you do not have them, ask someone to add them for you. What else can I do to help prevent falls? Wear shoes that: Do not have high heels. Have rubber bottoms. Are comfortable and fit you well. Are closed at the toe. Do not wear sandals. If you use a stepladder: Make sure that it is fully opened. Do not climb a closed stepladder. Make sure that both sides of the stepladder are locked into place. Ask someone to hold it for you, if possible. Clearly mark and make sure that you can see: Any grab bars or handrails. First and last steps. Where the edge of each step is. Use tools that help you move around (mobility aids) if they are needed. These include: Canes. Walkers. Scooters. Crutches. Turn on the lights when you go into a dark area. Replace any light bulbs as soon as they burn out. Set up your furniture so you have a clear path. Avoid moving your furniture around. If any of your floors are uneven, fix them. If there are any pets around you, be aware of where they are. Review your medicines with your doctor. Some medicines can make you feel dizzy. This can increase your chance of falling. Ask your doctor what other things that you can do to help prevent falls. This information is not intended to replace advice given to you by your health care provider. Make sure you discuss any  questions you have with your health care provider. Document Released: 04/13/2009 Document Revised: 11/23/2015 Document Reviewed: 07/22/2014 Elsevier Interactive Patient Education  2017 [employer].

## 2022-05-30 ENCOUNTER — Telehealth: Payer: Self-pay

## 2022-05-30 NOTE — Telephone Encounter (Signed)
Pt called in to request refills of:   Prescription Request  05/30/2022  Is this a "Controlled Substance" medicine? Yes  LOV: 05/21/2022   What is the name of the medication or equipment? HYDROcodone-acetaminophen (NORCO) 10-325 MG tablet [[id number]]   Have you contacted your pharmacy to request a refill? No   Which pharmacy would you like this sent to?  [id number] - [place], [place] [address]   Patient notified that their request is being sent to the clinical staff for review and that they should receive a response within 2 business days.   Please advise at [phone]    Prescription Request  05/30/2022  Is this a "Controlled Substance" medicine? Yes  LOV: 05/21/2022   What is the name of the medication or equipment? diazepam (VALIUM) 5 MG tablet [[id number]]   Have you contacted your pharmacy to request a refill? No   Which pharmacy would you like this sent to?  [id number] - [place], [place] [address]   Patient notified that their request is being sent to the clinical staff for review and that they should receive a response within 2 business days.   Please advise at [phone]

## 2022-05-31 ENCOUNTER — Other Ambulatory Visit: Payer: Self-pay | Admitting: Family Medicine

## 2022-05-31 DIAGNOSIS — M546 Pain in thoracic spine: Secondary | ICD-10-CM | POA: Diagnosis not present

## 2022-05-31 DIAGNOSIS — M9902 Segmental and somatic dysfunction of thoracic region: Secondary | ICD-10-CM | POA: Diagnosis not present

## 2022-05-31 DIAGNOSIS — M9901 Segmental and somatic dysfunction of cervical region: Secondary | ICD-10-CM | POA: Diagnosis not present

## 2022-05-31 DIAGNOSIS — M62830 Muscle spasm of back: Secondary | ICD-10-CM | POA: Diagnosis not present

## 2022-05-31 DIAGNOSIS — M9903 Segmental and somatic dysfunction of lumbar region: Secondary | ICD-10-CM | POA: Diagnosis not present

## 2022-05-31 DIAGNOSIS — M542 Cervicalgia: Secondary | ICD-10-CM | POA: Diagnosis not present

## 2022-05-31 MED ORDER — HYDROCODONE-ACETAMINOPHEN 10-325 MG PO TABS: 1.0000 | ORAL_TABLET | Freq: Four times a day (QID) | ORAL | 0 refills | Status: DC | PRN

## 2022-05-31 MED ORDER — DIAZEPAM 5 MG PO TABS: 5.0000 mg | ORAL_TABLET | Freq: Every evening | ORAL | 0 refills | Status: DC | PRN

## 2022-06-07 DIAGNOSIS — M62830 Muscle spasm of back: Secondary | ICD-10-CM | POA: Diagnosis not present

## 2022-06-07 DIAGNOSIS — M546 Pain in thoracic spine: Secondary | ICD-10-CM | POA: Diagnosis not present

## 2022-06-07 DIAGNOSIS — M9901 Segmental and somatic dysfunction of cervical region: Secondary | ICD-10-CM | POA: Diagnosis not present

## 2022-06-07 DIAGNOSIS — M542 Cervicalgia: Secondary | ICD-10-CM | POA: Diagnosis not present

## 2022-06-07 DIAGNOSIS — M9903 Segmental and somatic dysfunction of lumbar region: Secondary | ICD-10-CM | POA: Diagnosis not present

## 2022-06-07 DIAGNOSIS — M9902 Segmental and somatic dysfunction of thoracic region: Secondary | ICD-10-CM | POA: Diagnosis not present

## 2022-06-11 ENCOUNTER — Ambulatory Visit (INDEPENDENT_AMBULATORY_CARE_PROVIDER_SITE_OTHER): Payer: PPO | Admitting: Family Medicine

## 2022-06-11 ENCOUNTER — Encounter: Payer: Self-pay | Admitting: Family Medicine

## 2022-06-11 VITALS — BP 128/68 | HR 59 | Ht 65.0 in | Wt 151.8 lb

## 2022-06-11 DIAGNOSIS — I2510 Atherosclerotic heart disease of native coronary artery without angina pectoris: Secondary | ICD-10-CM

## 2022-06-11 DIAGNOSIS — Z0000 Encounter for general adult medical examination without abnormal findings: Secondary | ICD-10-CM

## 2022-06-11 DIAGNOSIS — E118 Type 2 diabetes mellitus with unspecified complications: Secondary | ICD-10-CM | POA: Diagnosis not present

## 2022-06-11 DIAGNOSIS — I5030 Unspecified diastolic (congestive) heart failure: Secondary | ICD-10-CM | POA: Diagnosis not present

## 2022-06-11 DIAGNOSIS — Z1231 Encounter for screening mammogram for malignant neoplasm of breast: Secondary | ICD-10-CM | POA: Diagnosis not present

## 2022-06-11 DIAGNOSIS — I10 Essential (primary) hypertension: Secondary | ICD-10-CM

## 2022-06-11 MED ORDER — SPIRONOLACTONE 25 MG PO TABS: 12.5000 mg | ORAL_TABLET | Freq: Every day | ORAL | 3 refills | Status: DC

## 2022-06-11 NOTE — Progress Notes (Signed)
Subjective:    Patient ID: Erika Lee, female    DOB: [birth year], 76 y.o.   MRN: [id number] 04/15/22 Please see my office visit from last week.  Patient has only lost 1 additional pound despite aggressive diuresis with Bumex.  She continues to have swelling in both legs.  BNP was slightly elevated at 130.  Recent echocardiogram revealed normal ejection fraction.  Therefore I believe the patient has congestive heart failure with preserved ejection fraction.  At that time, my plan was: Continue Bumex.  I wrapped both legs and put them in Unna boots today.  Recheck on Thursday.  Recommended switching metformin to Jardiance due to heart failure with preserved ejection fraction.  Patient will check on the price.  Recheck Thursday 04/18/22 Patient has lost 3 pounds over the last week.  The swelling in her legs has improved.  I removed the Unna boots today.  There is no evidence of secondary cellulitis or weeping ulcers.  She denies any shortness of breath.  She is started the Jardiance 10 mg a day.  At that time, my plan was: The swelling in her legs is better.  I want her to start wearing knee-high compression hose 10 to 20 mmHg to help with third spacing and peripheral edema.  Continue Bumex at the current dose 2 mg a day.  Continue Jardiance 10 mg a day and check a BMP to monitor kidney function and potassium.  If the swelling worsens, I will add spironolactone next as a diuretic and optimize therapy for heart failure with preserved ejection fraction  06/11/22 Here for CPE.  Has CHF with preserved EF.  Recently started Jardiance.  Due to recheck labs today.  Overdue for mammogram and DEXA.  Patient is under stress caring for her husband on hospice.  Denies depression, falls or memory loss Immunization History  Administered Date(s) Administered   Fluad Quad(high Dose 65+) 04/20/2020, 03/23/2021, 04/15/2022   Influenza Split 04/16/2017, 04/23/2018   Influenza, High Dose Seasonal PF 04/23/2018,  03/02/2019   PFIZER(Purple Top)SARS-COV-2 Vaccination 08/04/2019, 08/28/2019   Pneumococcal Conjugate-13 06/02/2018   Pneumococcal Polysaccharide-23 07/16/2016     Past Medical History:  Diagnosis Date   Anticoagulant long-term use    eliquis   Chronic sinusitis    GERD (gastroesophageal reflux disease)    Hemorrhoids    History of colitis 10/2016   campylobacter   Hyperlipidemia    Hypertension    Multinodular goiter    per pt has had 2 biopsy's both benign   NSTEMI (non-ST elevated myocardial infarction) ([place])    12/2018- normal coronary arteries on cath, poss coronary vasospasm.    Osteoarthritis    "all over"  and [facility] joint left shoulder   Paroxysmal atrial fibrillation [facility]) EP cardiologist--  [name]   s/p PVI by [name] 07/16/2013;   pt first dx 2008,  recurrence 2014 afib/flutter and tachy-brady   Rotator cuff tear, left    Shoulder impingement, left    Status post placement of implantable loop recorder    original placement 07-26-2014;  removal / replacement 10-17-2017  by dr allred   Type 2 diabetes mellitus ([place])    followed by pcp   Past Surgical History:  Procedure Laterality Date   ATRIAL FIBRILLATION ABLATION N/A 07/16/2013   PVI and CTI ablation by [name]   CARDIAC CATHETERIZATION     01/11/2019- Normal coronary arteries.    COLONOSCOPY  09/10/2011   Dr. Arnoldo Morale: normal, 10 year follow up.  implantable loop recorder removal  04/25/2021   MDT LINQ removed by [name]   LEFT HEART CATH AND CORONARY ANGIOGRAPHY N/A 01/11/2019   Procedure: LEFT HEART CATH AND CORONARY ANGIOGRAPHY;  Surgeon: Lorretta Harp, MD;  Location: [place] [facility];  Service: Cardiovascular;  Laterality: N/A;   LESION REMOVAL N/A 05/03/2013   Procedure: EXCISION CYST, BACK;  Surgeon: Jamesetta So, MD;  Location: [facility];  Service: General;  Laterality: N/A;   LOOP RECORDER IMPLANT N/A 07/26/2014   Procedure: LOOP RECORDER IMPLANT;  Surgeon: Thompson Grayer, MD;  Location: [facility]  CATH LAB;  Service: Cardiovascular;  Laterality: N/A;   LOOP RECORDER INSERTION N/A 10/17/2017   MDT previously implanted ILR for afib management at [facility].  old device removed and new device placed by [name]   LOOP RECORDER REMOVAL N/A 10/17/2017   MDT ILR removed with new device subsequently replaced   SHOULDER ARTHROSCOPY WITH ROTATOR CUFF REPAIR AND SUBACROMIAL DECOMPRESSION Left 01/07/2019   Procedure: Left shoulder subacromial decompression, distal clavicle resection, extensive debridement,;  Surgeon: Nicholes Stairs, MD;  Location: [facility];  Service: Orthopedics;  Laterality: Left;   TEE WITHOUT CARDIOVERSION N/A 07/15/2013   Procedure: TRANSESOPHAGEAL ECHOCARDIOGRAM (TEE);  Surgeon: Lelon Perla, MD;  Location: [facility] ENDOSCOPY;  Service: Cardiovascular;  Laterality: N/A;   TONSILLECTOMY  age 68   TRANSANAL HEMORRHOIDAL DEARTERIALIZATION N/A 09/22/2015   Procedure: TRANSANAL HEMORRHOIDAL LIGATION/PEXY EUA POSSIBLE HEMORRHOIDECTOMY ;  Surgeon: Michael Boston, MD;  Location: [facility];  Service: General;  Laterality: N/A;   Current Outpatient Medications on File Prior to Visit  Medication Sig Dispense Refill   amLODipine (NORVASC) 5 MG tablet Take 0.5 tablets (2.5 mg total) by mouth daily. 45 tablet 2   bumetanide (BUMEX) 2 MG tablet Take 1 tablet (2 mg total) by mouth daily. Stop furosemide 30 tablet 1   cephALEXin (KEFLEX) 500 MG capsule Take 1 capsule (500 mg total) by mouth 3 (three) times daily. 15 capsule 0   diazepam (VALIUM) 5 MG tablet Take 1 tablet (5 mg total) by mouth at bedtime as needed (sleep). 30 tablet 0   diclofenac Sodium (VOLTAREN) 1 % GEL Apply 2 g topically 4 (four) times daily. 100 g 2   ELIQUIS 5 MG TABS tablet TAKE 1 TABLET(5 MG) BY MOUTH TWICE DAILY 60 tablet 3   empagliflozin (JARDIANCE) 10 MG TABS tablet Take by mouth daily.     esomeprazole (NEXIUM) 20 MG capsule Take 20 mg by mouth daily.      glucose blood (FREESTYLE PRECISION NEO TEST)  test strip DX: E11.9 Use to check blood sugar daily (Patient not taking: Reported on 05/21/2022) 50 strip 11   HYDROcodone-acetaminophen (NORCO) 10-325 MG tablet Take 1 tablet by mouth every 6 (six) hours as needed. 120 tablet 0   Lancets (FREESTYLE) lancets USE TO CHECK BLOOD SUGAR DAILY (Patient not taking: Reported on 05/21/2022) 100 each 2   lisinopril (ZESTRIL) 40 MG tablet TAKE 1 TABLET(40 MG) BY MOUTH DAILY 90 tablet 3   ondansetron (ZOFRAN) 4 MG tablet Take 1 tablet (4 mg total) by mouth every 8 (eight) hours as needed for nausea or vomiting. 20 tablet 0   Potassium Gluconate 550 MG TABS Take 550 mg by mouth at bedtime.      promethazine (PHENERGAN) 12.5 MG tablet TAKE 1 TABLET(12.5 MG) BY MOUTH EVERY 6 HOURS AS NEEDED FOR NAUSEA OR VOMITING 30 tablet 3   simvastatin (ZOCOR) 40 MG tablet TAKE 1 TABLET(40 MG) BY  MOUTH AT BEDTIME 90 tablet 1   triamcinolone cream (KENALOG) 0.1 % Apply 1 Application topically 2 (two) times daily. 30 g 0   No current facility-administered medications on file prior to visit.     Allergies  Allergen Reactions   Clindamycin/Lincomycin Rash   Doxycycline Other (See Comments)    Makes heart race   Keflex [Cephalexin] Nausea And Vomiting   Other    Sulfonic Acid (3,5-Dibromo-4-H-Ox-Benz)    Adhesive [Tape] Rash   Latex Rash   Sulfonamide Derivatives Nausea And Vomiting   Social History   Socioeconomic History   Marital status: Married    Spouse name: Lawyer   Number of children: 2   Years of education: 12   Highest education level: 12th grade  Occupational History   Occupation: Pensions consultant: RETIRED    Comment: Full time  Tobacco Use   Smoking status: Former    Years: 34.00    Types: Cigarettes    Quit date: 01/02/1994    Years since quitting: 28.4    Passive exposure: Past   Smokeless tobacco: Never  Vaping Use   Vaping Use: Never used  Substance and Sexual Activity   Alcohol use: No   Drug use: No   Sexual  activity: Not Currently    Partners: Male  Other Topics Concern   Not on file  Social History Narrative   Lives with spouse in [place]   One child deceased from homicide at age 15    Social Determinants of Health   Financial Resource Strain: [place]  (05/21/2022)   Overall Financial Resource Strain (CARDIA)    Difficulty of Paying Living Expenses: Not hard at all  Food Insecurity: No Food Insecurity (05/21/2022)   Hunger Vital Sign    Worried About Running Out of Food in the Last Year: Never true    Ran Out of Food in the Last Year: Never true  Transportation Needs: No Transportation Needs (05/21/2022)   PRAPARE - Hydrologist (Medical): No    Lack of Transportation (Non-Medical): No  Physical Activity: Inactive (05/21/2022)   Exercise Vital Sign    Days of Exercise per Week: 0 days    Minutes of Exercise per Session: 0 min  Stress: Stress Concern Present (05/21/2022)   [place]    Feeling of Stress : To some extent  Social Connections: Moderately Integrated (05/21/2022)   Social Connection and Isolation Panel [NHANES]    Frequency of Communication with Friends and Family: More than three times a week    Frequency of Social Gatherings with Friends and Family: Once a week    Attends Religious Services: 1 to 4 times per year    Active Member of [employer] or Organizations: No    Attends Archivist Meetings: Never    Marital Status: Married  Human resources officer Violence: Not At Risk (05/21/2022)   Humiliation, Afraid, Rape, and Kick questionnaire    Fear of Current or Ex-Partner: No    Emotionally Abused: No    Physically Abused: No    Sexually Abused: No     Review of Systems  All other systems reviewed and are negative.      Objective:   Physical Exam Vitals reviewed.  Constitutional:      General: She is not in acute distress.    Appearance: Normal appearance.  She is not ill-appearing or toxic-appearing.  Eyes:  Extraocular Movements: Extraocular movements intact.     Conjunctiva/sclera: Conjunctivae normal.     Pupils: Pupils are equal, round, and reactive to light.  Cardiovascular:     Rate and Rhythm: Normal rate and regular rhythm.     Pulses: Normal pulses.     Heart sounds: Normal heart sounds. No murmur heard.    No friction rub. No gallop.  Pulmonary:     Effort: Pulmonary effort is normal. No respiratory distress.     Breath sounds: No stridor. No wheezing, rhonchi or rales.  Abdominal:     General: Abdomen is flat. Bowel sounds are normal. There is no distension.     Palpations: Abdomen is soft.     Tenderness: There is no abdominal tenderness. There is no guarding.  Musculoskeletal:     Cervical back: Neck supple.     Right lower leg: Edema present.     Left lower leg: Edema present.  Skin:    General: Skin is warm.     Coloration: Skin is not jaundiced.     Findings: No bruising, erythema, lesion or rash.  Neurological:     General: No focal deficit present.     Mental Status: She is alert and oriented to person, place, and time.     Cranial Nerves: No cranial nerve deficit.     Sensory: No sensory deficit.     Motor: No weakness.     Coordination: Coordination normal.     Gait: Gait normal.     Deep Tendon Reflexes: Reflexes normal.  Psychiatric:        Mood and Affect: Mood normal.        Thought Content: Thought content normal.           Assessment & Plan:   Encounter for Medicare annual wellness exam - Plan: CBC with Differential/Platelet, COMPLETE METABOLIC PANEL WITH GFR, Lipid panel, Protein / Creatinine Ratio, Urine  Controlled type 2 diabetes mellitus with complication, without long-term current use of insulin ([place]) - Plan: CBC with Differential/Platelet, COMPLETE METABOLIC PANEL WITH GFR, Lipid panel, Protein / Creatinine Ratio, Urine  CAD in native artery - Plan: CBC with Differential/Platelet,  COMPLETE METABOLIC PANEL WITH GFR, Lipid panel, Protein / Creatinine Ratio, Urine  Essential hypertension, benign - Plan: CBC with Differential/Platelet, COMPLETE METABOLIC PANEL WITH GFR, Lipid panel, Protein / Creatinine Ratio, Urine  Heart failure with preserved ejection fraction, unspecified HF chronicity (HCC) - Plan: CBC with Differential/Platelet, COMPLETE METABOLIC PANEL WITH GFR, Lipid panel, Protein / Creatinine Ratio, Urine  Encounter for screening mammogram for malignant neoplasm of breast - Plan: MM Digital Screening Add spironolactone 12.5 mg poqday.  Recheck bmp in 2 weeks.  Recommend COVID booster.  Schedule mammogram.  Check cbc, cmp, flp, urine protein/cr.

## 2022-06-12 LAB — COMPLETE METABOLIC PANEL WITH GFR
AG Ratio: 2.3 (calc) (ref 1.0–2.5)
ALT: 9 U/L (ref 6–29)
AST: 11 U/L (ref 10–35)
Albumin: 4.5 g/dL (ref 3.6–5.1)
Alkaline phosphatase (APISO): 89 U/L (ref 37–153)
BUN: 16 mg/dL (ref 7–25)
CO2: 28 mmol/L (ref 20–32)
Calcium: 10.3 mg/dL (ref 8.6–10.4)
Chloride: 103 mmol/L (ref 98–110)
Creat: 0.95 mg/dL (ref 0.60–1.00)
Globulin: 2 g/dL (calc) (ref 1.9–3.7)
Glucose, Bld: 110 mg/dL — ABNORMAL HIGH (ref 65–99)
Potassium: 4.2 mmol/L (ref 3.5–5.3)
Sodium: 139 mmol/L (ref 135–146)
Total Bilirubin: 0.5 mg/dL (ref 0.2–1.2)
Total Protein: 6.5 g/dL (ref 6.1–8.1)
eGFR: 61 mL/min/{1.73_m2} (ref >=60)

## 2022-06-12 LAB — CBC WITH DIFFERENTIAL/PLATELET
Absolute Monocytes: 555 cells/uL (ref 200–950)
Basophils Absolute: 31 cells/uL (ref 0–200)
Basophils Relative: 0.5 %
Eosinophils Absolute: 201 cells/uL (ref 15–500)
Eosinophils Relative: 3.3 %
HCT: 39.9 % (ref 35.0–45.0)
Hemoglobin: 13.5 g/dL (ref 11.7–15.5)
Lymphs Abs: 2031 cells/uL (ref 850–3900)
MCH: 29.6 pg (ref 27.0–33.0)
MCHC: 33.8 g/dL (ref 32.0–36.0)
MCV: 87.5 fL (ref 80.0–100.0)
MPV: 10.1 fL (ref 7.5–12.5)
Monocytes Relative: 9.1 %
Neutro Abs: 3282 cells/uL (ref 1500–7800)
Neutrophils Relative %: 53.8 %
Platelets: 199 10*3/uL (ref 140–400)
RBC: 4.56 10*6/uL (ref 3.80–5.10)
RDW: 12.4 % (ref 11.0–15.0)
Total Lymphocyte: 33.3 %
WBC: 6.1 10*3/uL (ref 3.8–10.8)

## 2022-06-12 LAB — LIPID PANEL
Cholesterol: 126 mg/dL (ref <200)
HDL: 48 mg/dL — ABNORMAL LOW (ref >=50)
LDL Cholesterol (Calc): 60 mg/dL (calc)
Non-HDL Cholesterol (Calc): 78 mg/dL (calc) (ref <130)
Total CHOL/HDL Ratio: 2.6 (calc) (ref <5.0)
Triglycerides: 96 mg/dL (ref <150)

## 2022-06-12 LAB — PROTEIN / CREATININE RATIO, URINE
Creatinine, Urine: 71 mg/dL (ref 20–275)
Protein/Creat Ratio: 85 mg/g creat (ref 24–184)
Protein/Creatinine Ratio: 0.085 mg/mg creat (ref 0.024–0.184)
Total Protein, Urine: 6 mg/dL (ref 5–24)

## 2022-06-14 DIAGNOSIS — M9903 Segmental and somatic dysfunction of lumbar region: Secondary | ICD-10-CM | POA: Diagnosis not present

## 2022-06-14 DIAGNOSIS — M9902 Segmental and somatic dysfunction of thoracic region: Secondary | ICD-10-CM | POA: Diagnosis not present

## 2022-06-14 DIAGNOSIS — M9901 Segmental and somatic dysfunction of cervical region: Secondary | ICD-10-CM | POA: Diagnosis not present

## 2022-06-14 DIAGNOSIS — M62830 Muscle spasm of back: Secondary | ICD-10-CM | POA: Diagnosis not present

## 2022-06-14 DIAGNOSIS — M546 Pain in thoracic spine: Secondary | ICD-10-CM | POA: Diagnosis not present

## 2022-06-14 DIAGNOSIS — M542 Cervicalgia: Secondary | ICD-10-CM | POA: Diagnosis not present

## 2022-06-15 ENCOUNTER — Other Ambulatory Visit: Payer: Self-pay | Admitting: Family Medicine

## 2022-06-19 ENCOUNTER — Telehealth: Payer: Self-pay

## 2022-06-19 NOTE — Telephone Encounter (Signed)
Pt called in asking if she could get some cough medicine sent in for a hacking cough that she has developed. Pt stated that she has taken this med benzonatate (TESSALON) 200 MG capsule [[id number]]  DISCONTINUED before and would like to know if pcp would send this in for her cough. Please advise.  Cb#: [phone]

## 2022-06-20 ENCOUNTER — Other Ambulatory Visit: Payer: Self-pay | Admitting: Family Medicine

## 2022-06-20 MED ORDER — BENZONATATE 200 MG PO CAPS: 200.0000 mg | ORAL_CAPSULE | Freq: Three times a day (TID) | ORAL | 0 refills | Status: DC | PRN

## 2022-06-20 NOTE — Telephone Encounter (Signed)
Patient called to report she now has a sore throat. Nurse advised for patient to try Chloraseptic spray, Tylenol, warm salt water gargles. Patient verbalized understanding but expressed a concern about sx worsening and wants to know whether or not an antibiotic should be called in.   Please advise at [phone].

## 2022-06-21 DIAGNOSIS — M542 Cervicalgia: Secondary | ICD-10-CM | POA: Diagnosis not present

## 2022-06-21 DIAGNOSIS — M62830 Muscle spasm of back: Secondary | ICD-10-CM | POA: Diagnosis not present

## 2022-06-21 DIAGNOSIS — M9903 Segmental and somatic dysfunction of lumbar region: Secondary | ICD-10-CM | POA: Diagnosis not present

## 2022-06-21 DIAGNOSIS — M546 Pain in thoracic spine: Secondary | ICD-10-CM | POA: Diagnosis not present

## 2022-06-21 DIAGNOSIS — M9902 Segmental and somatic dysfunction of thoracic region: Secondary | ICD-10-CM | POA: Diagnosis not present

## 2022-06-21 DIAGNOSIS — M9901 Segmental and somatic dysfunction of cervical region: Secondary | ICD-10-CM | POA: Diagnosis not present

## 2022-06-26 ENCOUNTER — Ambulatory Visit (INDEPENDENT_AMBULATORY_CARE_PROVIDER_SITE_OTHER): Payer: PPO | Admitting: Family Medicine

## 2022-06-26 ENCOUNTER — Encounter: Payer: Self-pay | Admitting: Family Medicine

## 2022-06-26 VITALS — BP 115/80 | HR 56 | Temp 97.6°F | Ht 65.0 in | Wt 146.0 lb

## 2022-06-26 DIAGNOSIS — B9689 Other specified bacterial agents as the cause of diseases classified elsewhere: Secondary | ICD-10-CM | POA: Diagnosis not present

## 2022-06-26 DIAGNOSIS — J019 Acute sinusitis, unspecified: Secondary | ICD-10-CM | POA: Diagnosis not present

## 2022-06-26 MED ORDER — LEVOFLOXACIN 500 MG PO TABS: 500.0000 mg | ORAL_TABLET | Freq: Every day | ORAL | 0 refills | Status: AC

## 2022-06-26 NOTE — Patient Instructions (Signed)
Your symptoms and exam findings are most consistent with a bacterial sinus infection. These usually run their course in 5-7 days. If your symptoms start feeling significantly worse, please call us right away to be further evaluated.  Some things that can make you feel better are: - Increased rest - Increasing fluid with water/sugar free electrolytes - Acetaminophen as needed for fever/pain.  - Salt water gargling, chloraseptic spray and throat lozenges - OTC guaifenesin (Mucinex).  - Saline sinus flushes or a neti pot.  - Humidifying the air.

## 2022-06-26 NOTE — Progress Notes (Signed)
Acute Office Visit  Subjective:     Patient ID: Erika Lee, female    DOB: Oct 03, 1945, 76 y.o.   MRN: [id number]  Chief Complaint  Patient presents with   Acute Visit    stuffy nose/sore throat/coughing    HPI Patient is in today for at least 7 days of mucopurulent cough and rhinorrhea, facial pressure, congestion, malaise, sore throat Denies fever, chills, body aches, chest pain, shortness of breath, wheezing. Has tried Mining engineer.  Review of Systems  All other systems reviewed and are negative.   Past Medical History:  Diagnosis Date   Anticoagulant long-term use    eliquis   Chronic sinusitis    GERD (gastroesophageal reflux disease)    Heart failure with preserved ejection fraction ([place])    Hemorrhoids    History of colitis 10/2016   campylobacter   Hyperlipidemia    Hypertension    Multinodular goiter    per pt has had 2 biopsy's both benign   NSTEMI (non-ST elevated myocardial infarction) ([place])    12/2018- normal coronary arteries on cath, poss coronary vasospasm.    Osteoarthritis    "all over"  and [facility] joint left shoulder   Paroxysmal atrial fibrillation [facility]) EP cardiologist--  [name]   s/p PVI by [name] 07/16/2013;   pt first dx 2008,  recurrence 2014 afib/flutter and tachy-brady   Rotator cuff tear, left    Shoulder impingement, left    Status post placement of implantable loop recorder    original placement 07-26-2014;  removal / replacement 10-17-2017  by dr allred   Type 2 diabetes mellitus ([place])    followed by pcp   Past Surgical History:  Procedure Laterality Date   ATRIAL FIBRILLATION ABLATION N/A 07/16/2013   PVI and CTI ablation by [name]   CARDIAC CATHETERIZATION     01/11/2019- Normal coronary arteries.    COLONOSCOPY  09/10/2011   Dr. Arnoldo Morale: normal, 10 year follow up.   implantable loop recorder removal  04/25/2021   MDT LINQ removed by [name]   LEFT HEART CATH AND CORONARY ANGIOGRAPHY N/A 01/11/2019   Procedure: LEFT  HEART CATH AND CORONARY ANGIOGRAPHY;  Surgeon: Lorretta Harp, MD;  Location: [facility];  Service: Cardiovascular;  Laterality: N/A;   LESION REMOVAL N/A 05/03/2013   Procedure: EXCISION CYST, BACK;  Surgeon: Jamesetta So, MD;  Location: [facility];  Service: General;  Laterality: N/A;   LOOP RECORDER IMPLANT N/A 07/26/2014   Procedure: LOOP RECORDER IMPLANT;  Surgeon: Thompson Grayer, MD;  Location: [facility] CATH LAB;  Service: Cardiovascular;  Laterality: N/A;   LOOP RECORDER INSERTION N/A 10/17/2017   MDT previously implanted ILR for afib management at [facility].  old device removed and new device placed by [name]   LOOP RECORDER REMOVAL N/A 10/17/2017   MDT ILR removed with new device subsequently replaced   SHOULDER ARTHROSCOPY WITH ROTATOR CUFF REPAIR AND SUBACROMIAL DECOMPRESSION Left 01/07/2019   Procedure: Left shoulder subacromial decompression, distal clavicle resection, extensive debridement,;  Surgeon: Nicholes Stairs, MD;  Location: [facility];  Service: Orthopedics;  Laterality: Left;   TEE WITHOUT CARDIOVERSION N/A 07/15/2013   Procedure: TRANSESOPHAGEAL ECHOCARDIOGRAM (TEE);  Surgeon: Lelon Perla, MD;  Location: [facility] ENDOSCOPY;  Service: Cardiovascular;  Laterality: N/A;   TONSILLECTOMY  age 28   TRANSANAL HEMORRHOIDAL DEARTERIALIZATION N/A 09/22/2015   Procedure: TRANSANAL HEMORRHOIDAL LIGATION/PEXY EUA POSSIBLE HEMORRHOIDECTOMY ;  Surgeon: Michael Boston, MD;  Location: [facility];  Service:  General;  Laterality: N/A;   Current Outpatient Medications on File Prior to Visit  Medication Sig Dispense Refill   benzonatate (TESSALON) 200 MG capsule Take 200 mg by mouth 3 (three) times daily as needed for cough.     bumetanide (BUMEX) 2 MG tablet Take 1 tablet (2 mg total) by mouth daily. Stop furosemide 30 tablet 1   diazepam (VALIUM) 5 MG tablet Take 1 tablet (5 mg total) by mouth at bedtime as needed (sleep). 30 tablet 0   diclofenac Sodium (VOLTAREN) 1 % GEL  Apply 2 g topically 4 (four) times daily. 100 g 2   ELIQUIS 5 MG TABS tablet TAKE 1 TABLET(5 MG) BY MOUTH TWICE DAILY 60 tablet 3   empagliflozin (JARDIANCE) 10 MG TABS tablet Take by mouth daily.     esomeprazole (NEXIUM) 20 MG capsule Take 20 mg by mouth daily.      glucose blood (FREESTYLE PRECISION NEO TEST) test strip DX: E11.9 Use to check blood sugar daily 50 strip 11   HYDROcodone-acetaminophen (NORCO) 10-325 MG tablet Take 1 tablet by mouth every 6 (six) hours as needed. 120 tablet 0   Lancets (FREESTYLE) lancets USE TO CHECK BLOOD SUGAR DAILY 100 each 2   lisinopril (ZESTRIL) 40 MG tablet TAKE 1 TABLET(40 MG) BY MOUTH DAILY 90 tablet 3   ondansetron (ZOFRAN) 4 MG tablet Take 1 tablet (4 mg total) by mouth every 8 (eight) hours as needed for nausea or vomiting. 20 tablet 0   promethazine (PHENERGAN) 12.5 MG tablet TAKE 1 TABLET(12.5 MG) BY MOUTH EVERY 6 HOURS AS NEEDED FOR NAUSEA OR VOMITING 30 tablet 3   simvastatin (ZOCOR) 40 MG tablet TAKE 1 TABLET(40 MG) BY MOUTH AT BEDTIME 90 tablet 1   spironolactone (ALDACTONE) 25 MG tablet Take 0.5 tablets (12.5 mg total) by mouth daily. 90 tablet 3   triamcinolone cream (KENALOG) 0.1 % Apply 1 Application topically 2 (two) times daily. 30 g 0   No current facility-administered medications on file prior to visit.   Allergies  Allergen Reactions   Clindamycin/Lincomycin Rash   Doxycycline Other (See Comments)    Makes heart race   Keflex [Cephalexin] Nausea And Vomiting   Other    Sulfonic Acid (3,5-Dibromo-4-H-Ox-Benz)    Adhesive [Tape] Rash   Latex Rash   Sulfonamide Derivatives Nausea And Vomiting       Objective:    BP 115/80   Pulse (!) 56   Temp 97.6 F (36.4 C) (Oral)   Ht '5\' 5"'$  (1.651 m)   Wt 146 lb (66.2 kg)   LMP  (LMP Unknown)   SpO2 96%   BMI 24.30 kg/m    Physical Exam  No results found for any visits on 06/26/22.      Assessment & Plan:   Problem List Items Addressed This Visit       Respiratory    Acute bacterial rhinosinusitis - Primary    Symptoms today consistent with bacterial sinusitis with mucopurulent cough and rhinorrhea, sinus pressure for greater than 7 days. Will treat with Levofloxacin '500mg'$  daily for 7 days given her allergies. Discussed the risks vs benefits and she would like to proceed with antibiotic treatment. May continue supportive treatment at home with tylenol for aches and pains, mucinex for congestion, and tessalon as prescribed for cough. Return to office if symptoms persist or worsen.      Relevant Medications   benzonatate (TESSALON) 200 MG capsule   levofloxacin (LEVAQUIN) 500 MG tablet    Meds  ordered this encounter  Medications   levofloxacin (LEVAQUIN) 500 MG tablet    Sig: Take 1 tablet (500 mg total) by mouth daily for 7 days.    Dispense:  7 tablet    Refill:  0    Order Specific Question:   Supervising Provider    Answer:   [name] T [[id number]]    Return if symptoms worsen or fail to improve.  Rubie Maid, FNP

## 2022-06-26 NOTE — Assessment & Plan Note (Signed)
Symptoms today consistent with bacterial sinusitis with mucopurulent cough and rhinorrhea, sinus pressure for greater than 7 days. Will treat with Levofloxacin '500mg'$  daily for 7 days given her allergies. Discussed the risks vs benefits and she would like to proceed with antibiotic treatment. May continue supportive treatment at home with tylenol for aches and pains, mucinex for congestion, and tessalon as prescribed for cough. Return to office if symptoms persist or worsen.

## 2022-06-28 ENCOUNTER — Encounter: Payer: Self-pay | Admitting: Family Medicine

## 2022-06-28 ENCOUNTER — Ambulatory Visit (INDEPENDENT_AMBULATORY_CARE_PROVIDER_SITE_OTHER): Payer: PPO | Admitting: Family Medicine

## 2022-06-28 VITALS — BP 110/54 | HR 53 | Temp 97.9°F | Ht 65.0 in | Wt 146.0 lb

## 2022-06-28 DIAGNOSIS — J019 Acute sinusitis, unspecified: Secondary | ICD-10-CM | POA: Diagnosis not present

## 2022-06-28 DIAGNOSIS — B9689 Other specified bacterial agents as the cause of diseases classified elsewhere: Secondary | ICD-10-CM

## 2022-06-28 MED ORDER — AMOXICILLIN-POT CLAVULANATE 875-125 MG PO TABS: 1.0000 | ORAL_TABLET | Freq: Two times a day (BID) | ORAL | 0 refills | Status: DC

## 2022-06-28 NOTE — Progress Notes (Signed)
Subjective:    Patient ID: Erika Lee, female    DOB: 04-09-1946, 76 y.o.   MRN: [id number] Patient is a very pleasant 76 year old Caucasian female who presents today complaining of facial pain, pain in her sinuses, postnasal drip, rhinorrhea, and a persistent cough.  She has had the symptoms now for more than a week.  She saw my partner earlier this week who gave her Levaquin however she is hesitant to take the medication due to the black box warnings.  She does have a listed allergy to Keflex however she has taken penicillin numerous times in the past without allergic reaction.  He denies any shortness of breath or chest pain or pleurisy  Past Medical History:  Diagnosis Date   Anticoagulant long-term use    eliquis   Chronic sinusitis    GERD (gastroesophageal reflux disease)    Heart failure with preserved ejection fraction ([place])    Hemorrhoids    History of colitis 10/2016   campylobacter   Hyperlipidemia    Hypertension    Multinodular goiter    per pt has had 2 biopsy's both benign   NSTEMI (non-ST elevated myocardial infarction) ([place])    12/2018- normal coronary arteries on cath, poss coronary vasospasm.    Osteoarthritis    "all over"  and [facility] joint left shoulder   Paroxysmal atrial fibrillation [facility]) EP cardiologist--  [name]   s/p PVI by [name] 07/16/2013;   pt first dx 2008,  recurrence 2014 afib/flutter and tachy-brady   Rotator cuff tear, left    Shoulder impingement, left    Status post placement of implantable loop recorder    original placement 07-26-2014;  removal / replacement 10-17-2017  by dr allred   Type 2 diabetes mellitus ([place])    followed by pcp   Past Surgical History:  Procedure Laterality Date   ATRIAL FIBRILLATION ABLATION N/A 07/16/2013   PVI and CTI ablation by [name]   CARDIAC CATHETERIZATION     01/11/2019- Normal coronary arteries.    COLONOSCOPY  09/10/2011   Dr. Arnoldo Morale: normal, 10 year follow up.   implantable loop recorder  removal  04/25/2021   MDT LINQ removed by [name]   LEFT HEART CATH AND CORONARY ANGIOGRAPHY N/A 01/11/2019   Procedure: LEFT HEART CATH AND CORONARY ANGIOGRAPHY;  Surgeon: Lorretta Harp, MD;  Location: [place] CV LAB;  Service: Cardiovascular;  Laterality: N/A;   LESION REMOVAL N/A 05/03/2013   Procedure: EXCISION CYST, BACK;  Surgeon: Jamesetta So, MD;  Location: [facility];  Service: General;  Laterality: N/A;   LOOP RECORDER IMPLANT N/A 07/26/2014   Procedure: LOOP RECORDER IMPLANT;  Surgeon: Thompson Grayer, MD;  Location: [facility] CATH LAB;  Service: Cardiovascular;  Laterality: N/A;   LOOP RECORDER INSERTION N/A 10/17/2017   MDT previously implanted ILR for afib management at [facility].  old device removed and new device placed by [name]   LOOP RECORDER REMOVAL N/A 10/17/2017   MDT ILR removed with new device subsequently replaced   SHOULDER ARTHROSCOPY WITH ROTATOR CUFF REPAIR AND SUBACROMIAL DECOMPRESSION Left 01/07/2019   Procedure: Left shoulder subacromial decompression, distal clavicle resection, extensive debridement,;  Surgeon: Nicholes Stairs, MD;  Location: [facility];  Service: Orthopedics;  Laterality: Left;   TEE WITHOUT CARDIOVERSION N/A 07/15/2013   Procedure: TRANSESOPHAGEAL ECHOCARDIOGRAM (TEE);  Surgeon: Lelon Perla, MD;  Location: [facility] ENDOSCOPY;  Service: Cardiovascular;  Laterality: N/A;   TONSILLECTOMY  age 54  TRANSANAL HEMORRHOIDAL DEARTERIALIZATION N/A 09/22/2015   Procedure: TRANSANAL HEMORRHOIDAL LIGATION/PEXY EUA POSSIBLE HEMORRHOIDECTOMY ;  Surgeon: Michael Boston, MD;  [facility]: [facility];  Service: General;  Laterality: N/A;   Current Outpatient Medications on File Prior to Visit  Medication Sig Dispense Refill   benzonatate (TESSALON) 200 MG capsule Take 200 mg by mouth 3 (three) times daily as needed for cough.     bumetanide (BUMEX) 2 MG tablet Take 1 tablet (2 mg total) by mouth daily. Stop furosemide 30 tablet 1   diazepam (VALIUM) 5  MG tablet Take 1 tablet (5 mg total) by mouth at bedtime as needed (sleep). 30 tablet 0   diclofenac Sodium (VOLTAREN) 1 % GEL Apply 2 g topically 4 (four) times daily. 100 g 2   ELIQUIS 5 MG TABS tablet TAKE 1 TABLET(5 MG) BY MOUTH TWICE DAILY 60 tablet 3   empagliflozin (JARDIANCE) 10 MG TABS tablet Take by mouth daily.     esomeprazole (NEXIUM) 20 MG capsule Take 20 mg by mouth daily.      glucose blood (FREESTYLE PRECISION NEO TEST) test strip DX: E11.9 Use to check blood sugar daily 50 strip 11   HYDROcodone-acetaminophen (NORCO) 10-325 MG tablet Take 1 tablet by mouth every 6 (six) hours as needed. 120 tablet 0   Lancets (FREESTYLE) lancets USE TO CHECK BLOOD SUGAR DAILY 100 each 2   levofloxacin (LEVAQUIN) 500 MG tablet Take 1 tablet (500 mg total) by mouth daily for 7 days. 7 tablet 0   lisinopril (ZESTRIL) 40 MG tablet TAKE 1 TABLET(40 MG) BY MOUTH DAILY 90 tablet 3   ondansetron (ZOFRAN) 4 MG tablet Take 1 tablet (4 mg total) by mouth every 8 (eight) hours as needed for nausea or vomiting. 20 tablet 0   promethazine (PHENERGAN) 12.5 MG tablet TAKE 1 TABLET(12.5 MG) BY MOUTH EVERY 6 HOURS AS NEEDED FOR NAUSEA OR VOMITING 30 tablet 3   simvastatin (ZOCOR) 40 MG tablet TAKE 1 TABLET(40 MG) BY MOUTH AT BEDTIME 90 tablet 1   spironolactone (ALDACTONE) 25 MG tablet Take 0.5 tablets (12.5 mg total) by mouth daily. 90 tablet 3   triamcinolone cream (KENALOG) 0.1 % Apply 1 Application topically 2 (two) times daily. 30 g 0   No current facility-administered medications on file prior to visit.     Allergies  Allergen Reactions   Clindamycin/Lincomycin Rash   Doxycycline Other (See Comments)    Makes heart race   Keflex [Cephalexin] Nausea And Vomiting   Other    Sulfonic Acid (3,5-Dibromo-4-H-Ox-Benz)    Adhesive [Tape] Rash   Latex Rash   Sulfonamide Derivatives Nausea And Vomiting   Social History   Socioeconomic History   Marital status: Married    Spouse name: Lawyer    Number of children: 2   Years of education: 12   Highest education level: 12th grade  Occupational History   Occupation: Pensions consultant: RETIRED    Comment: Full time  Tobacco Use   Smoking status: Former    Years: 34.00    Types: Cigarettes    Quit date: 01/02/1994    Years since quitting: 28.5    Passive exposure: Past   Smokeless tobacco: Never  Vaping Use   Vaping Use: Never used  Substance and Sexual Activity   Alcohol use: No   Drug use: No   Sexual activity: Not Currently    Partners: Male  Other Topics Concern   Not on file  Social History Narrative  Lives with spouse in [place]   One child deceased from homicide at age 68    Social Determinants of Health   Financial Resource Strain: Low Risk  (05/21/2022)   Overall Financial Resource Strain (CARDIA)    Difficulty of Paying Living Expenses: Not hard at all  Food Insecurity: No Food Insecurity (05/21/2022)   Hunger Vital Sign    Worried About Running Out of Food in the Last Year: Never true    Ran Out of Food in the Last Year: Never true  Transportation Needs: No Transportation Needs (05/21/2022)   PRAPARE - Hydrologist (Medical): No    Lack of Transportation (Non-Medical): No  Physical Activity: Inactive (05/21/2022)   Exercise Vital Sign    Days of Exercise per Week: 0 days    Minutes of Exercise per Session: 0 min  Stress: Stress Concern Present (05/21/2022)   Ass[place]    Feeling of Stress : To some extent  Social Connections: Moderately Integrated (05/21/2022)   Social Connection and Isolation Panel [NHANES]    Frequency of Communication with Friends and Family: More than three times a week    Frequency of Social Gatherings with Friends and Family: Once a week    Attends Religious Services: 1 to 4 times per year    Active Member of [employer] or Organizations: No    Attends Archivist  Meetings: Never    Marital Status: Married  Human resources officer Violence: Not At Risk (05/21/2022)   Humiliation, Afraid, Rape, and Kick questionnaire    Fear of Current or Ex-Partner: No    Emotionally Abused: No    Physically Abused: No    Sexually Abused: No     Review of Systems  All other systems reviewed and are negative.      Objective:   Physical Exam Vitals reviewed.  Constitutional:      General: She is not in acute distress.    Appearance: Normal appearance. She is not ill-appearing or toxic-appearing.  HENT:     Right Ear: Tympanic membrane and ear canal normal.     Left Ear: Tympanic membrane and ear canal normal.     Nose:     Right Turbinates: Not enlarged or swollen.     Left Turbinates: Not enlarged or swollen.     Right Sinus: Maxillary sinus tenderness and frontal sinus tenderness present.     Left Sinus: Maxillary sinus tenderness and frontal sinus tenderness present.  Eyes:     Extraocular Movements: Extraocular movements intact.     Conjunctiva/sclera: Conjunctivae normal.     Pupils: Pupils are equal, round, and reactive to light.  Cardiovascular:     Rate and Rhythm: Normal rate and regular rhythm.     Pulses: Normal pulses.     Heart sounds: Normal heart sounds. No murmur heard.    No friction rub. No gallop.  Pulmonary:     Effort: Pulmonary effort is normal. No respiratory distress.     Breath sounds: No stridor. No wheezing, rhonchi or rales.  Abdominal:     General: Abdomen is flat. Bowel sounds are normal. There is no distension.     Palpations: Abdomen is soft.     Tenderness: There is no abdominal tenderness. There is no guarding.  Musculoskeletal:     Cervical back: Neck supple.  Skin:    General: Skin is warm.     Coloration: Skin is not jaundiced.  Findings: No bruising, erythema, lesion or rash.  Neurological:     General: No focal deficit present.     Mental Status: She is alert and oriented to person, place, and time.      Cranial Nerves: No cranial nerve deficit.     Sensory: No sensory deficit.     Motor: No weakness.     Coordination: Coordination normal.     Gait: Gait normal.     Deep Tendon Reflexes: Reflexes normal.  Psychiatric:        Mood and Affect: Mood normal.        Thought Content: Thought content normal.           Assessment & Plan:   Acute bacterial rhinosinusitis Discontinue Levaquin due to patient's concern.  Begin Augmentin 875 mg twice daily for 10 days for sinusitis.

## 2022-07-04 ENCOUNTER — Telehealth: Payer: Self-pay

## 2022-07-04 ENCOUNTER — Other Ambulatory Visit: Payer: Self-pay

## 2022-07-04 ENCOUNTER — Other Ambulatory Visit: Payer: PPO

## 2022-07-04 DIAGNOSIS — I5030 Unspecified diastolic (congestive) heart failure: Secondary | ICD-10-CM

## 2022-07-04 DIAGNOSIS — M7989 Other specified soft tissue disorders: Secondary | ICD-10-CM | POA: Diagnosis not present

## 2022-07-04 DIAGNOSIS — E118 Type 2 diabetes mellitus with unspecified complications: Secondary | ICD-10-CM

## 2022-07-04 MED ORDER — EMPAGLIFLOZIN 10 MG PO TABS: 10.0000 mg | ORAL_TABLET | Freq: Every day | ORAL | 3 refills | Status: DC

## 2022-07-04 NOTE — Telephone Encounter (Signed)
Prescription Request  07/04/2022  Is this a "Controlled Substance" medicine? No  LOV: 06/28/22  What is the name of the medication or equipment? empagliflozin (JARDIANCE) 10 MG TABS tablet [[id number]]   Have you contacted your pharmacy to request a refill? No   Which pharmacy would you like this sent to?  [facility], [place]    Patient notified that their request is being sent to the clinical staff for review and that they should receive a response within 2 business days.   Please advise at [facility] [phone]    Prescription Request  07/04/2022  Is this a "Controlled Substance" medicine? Yes  LOV: 06/28/22  What is the name of the medication or equipment? benzonatate (TESSALON) 200 MG capsule [[id number]]   Have you contacted your pharmacy to request a refill? No   Which pharmacy would you like this sent to?  [id number] - [place], [place]    Patient notified that their request is being sent to the clinical staff for review and that they should receive a response within 2 business days.   Please advise at [place]

## 2022-07-05 LAB — BASIC METABOLIC PANEL
BUN: 16 mg/dL (ref 7–25)
CO2: 29 mmol/L (ref 20–32)
Calcium: 9.9 mg/dL (ref 8.6–10.4)
Chloride: 97 mmol/L — ABNORMAL LOW (ref 98–110)
Creat: 0.94 mg/dL (ref 0.60–1.00)
Glucose, Bld: 100 mg/dL — ABNORMAL HIGH (ref 65–99)
Potassium: 4.4 mmol/L (ref 3.5–5.3)
Sodium: 135 mmol/L (ref 135–146)

## 2022-07-10 NOTE — Telephone Encounter (Signed)
Prescription Request  07/10/2022  Is this a "Controlled Substance" medicine? No  LOV: 06/28/22  What is the name of the medication or equipment? glucose blood (FREESTYLE PRECISION NEO TEST) test strip [[id number]]  Have you contacted your pharmacy to request a refill? No   Which pharmacy would you like this sent to?  [id number] - [place], [place] [address]    Patient notified that their request is being sent to the clinical staff for review and that they should receive a response within 2 business days.   Please advise at [place]

## 2022-07-11 ENCOUNTER — Other Ambulatory Visit: Payer: Self-pay

## 2022-07-11 DIAGNOSIS — E118 Type 2 diabetes mellitus with unspecified complications: Secondary | ICD-10-CM

## 2022-07-11 MED ORDER — FREESTYLE PRECISION NEO TEST VI STRP: ORAL_STRIP | 11 refills | Status: DC

## 2022-07-12 ENCOUNTER — Other Ambulatory Visit: Payer: Self-pay

## 2022-07-12 ENCOUNTER — Other Ambulatory Visit: Payer: Self-pay | Admitting: Family Medicine

## 2022-07-12 DIAGNOSIS — I10 Essential (primary) hypertension: Secondary | ICD-10-CM

## 2022-07-12 MED ORDER — LISINOPRIL 40 MG PO TABS: ORAL_TABLET | ORAL | 3 refills | Status: DC

## 2022-07-15 ENCOUNTER — Ambulatory Visit (INDEPENDENT_AMBULATORY_CARE_PROVIDER_SITE_OTHER): Payer: PPO | Admitting: Family Medicine

## 2022-07-15 ENCOUNTER — Encounter: Payer: Self-pay | Admitting: Family Medicine

## 2022-07-15 VITALS — BP 114/56 | HR 62 | Temp 98.6°F | Ht 65.0 in | Wt 154.0 lb

## 2022-07-15 DIAGNOSIS — L608 Other nail disorders: Secondary | ICD-10-CM

## 2022-07-15 MED ORDER — HYDROCODONE-ACETAMINOPHEN 10-325 MG PO TABS: 1.0000 | ORAL_TABLET | Freq: Four times a day (QID) | ORAL | 0 refills | Status: DC | PRN

## 2022-07-15 NOTE — Progress Notes (Signed)
Subjective:    Patient ID: Erika Lee, female    DOB: May 26, 1946, 77 y.o.   MRN: [id number]  Patient has thick dysmorphic toenails on both feet.  They are thick and yellow and she is unable to cut them with regular toenail clippers.  They are very long and causing her pain.  She is requesting that I cut her toenails for her today Past Medical History:  Diagnosis Date   Anticoagulant long-term use    eliquis   Chronic sinusitis    GERD (gastroesophageal reflux disease)    Heart failure with preserved ejection fraction ([place])    Hemorrhoids    History of colitis 10/2016   campylobacter   Hyperlipidemia    Hypertension    Multinodular goiter    per pt has had 2 biopsy's both benign   NSTEMI (non-ST elevated myocardial infarction) ([place])    12/2018- normal coronary arteries on cath, poss coronary vasospasm.    Osteoarthritis    "all over"  and [facility] joint left shoulder   Paroxysmal atrial fibrillation [facility]) EP cardiologist--  [name]   s/p PVI by [name] 07/16/2013;   pt first dx 2008,  recurrence 2014 afib/flutter and tachy-brady   Rotator cuff tear, left    Shoulder impingement, left    Status post placement of implantable loop recorder    original placement 07-26-2014;  removal / replacement 10-17-2017  by dr allred   Type 2 diabetes mellitus ([place])    followed by pcp   Past Surgical History:  Procedure Laterality Date   ATRIAL FIBRILLATION ABLATION N/A 07/16/2013   PVI and CTI ablation by [name]   CARDIAC CATHETERIZATION     01/11/2019- Normal coronary arteries.    COLONOSCOPY  09/10/2011   Dr. Arnoldo Morale: normal, 10 year follow up.   implantable loop recorder removal  04/25/2021   MDT LINQ removed by [name]   LEFT HEART CATH AND CORONARY ANGIOGRAPHY N/A 01/11/2019   Procedure: LEFT HEART CATH AND CORONARY ANGIOGRAPHY;  Surgeon: Lorretta Harp, MD;  Location: [place] CV LAB;  Service: Cardiovascular;  Laterality: N/A;   LESION REMOVAL N/A 05/03/2013    Procedure: EXCISION CYST, BACK;  Surgeon: Jamesetta So, MD;  Location: [facility];  Service: General;  Laterality: N/A;   LOOP RECORDER IMPLANT N/A 07/26/2014   Procedure: LOOP RECORDER IMPLANT;  Surgeon: Thompson Grayer, MD;  Location: [facility] CATH LAB;  Service: Cardiovascular;  Laterality: N/A;   LOOP RECORDER INSERTION N/A 10/17/2017   MDT previously implanted ILR for afib management at [facility].  old device removed and new device placed by [name]   LOOP RECORDER REMOVAL N/A 10/17/2017   MDT ILR removed with new device subsequently replaced   SHOULDER ARTHROSCOPY WITH ROTATOR CUFF REPAIR AND SUBACROMIAL DECOMPRESSION Left 01/07/2019   Procedure: Left shoulder subacromial decompression, distal clavicle resection, extensive debridement,;  Surgeon: Nicholes Stairs, MD;  Location: [facility];  Service: Orthopedics;  Laterality: Left;   TEE WITHOUT CARDIOVERSION N/A 07/15/2013   Procedure: TRANSESOPHAGEAL ECHOCARDIOGRAM (TEE);  Surgeon: Lelon Perla, MD;  Location: [facility] ENDOSCOPY;  Service: Cardiovascular;  Laterality: N/A;   TONSILLECTOMY  age 43   TRANSANAL HEMORRHOIDAL DEARTERIALIZATION N/A 09/22/2015   Procedure: TRANSANAL HEMORRHOIDAL LIGATION/PEXY EUA POSSIBLE HEMORRHOIDECTOMY ;  Surgeon: Michael Boston, MD;  Location: [facility];  Service: General;  Laterality: N/A;   Current Outpatient Medications on File Prior to Visit  Medication Sig Dispense Refill   benzonatate (TESSALON) 200 MG  capsule Take 200 mg by mouth 3 (three) times daily as needed for cough.     bumetanide (BUMEX) 2 MG tablet Take 1 tablet (2 mg total) by mouth daily. Stop furosemide 30 tablet 1   diazepam (VALIUM) 5 MG tablet Take 1 tablet (5 mg total) by mouth at bedtime as needed (sleep). 30 tablet 0   diclofenac Sodium (VOLTAREN) 1 % GEL Apply 2 g topically 4 (four) times daily. 100 g 2   ELIQUIS 5 MG TABS tablet TAKE 1 TABLET(5 MG) BY MOUTH TWICE DAILY 60 tablet 3   empagliflozin (JARDIANCE) 10 MG TABS tablet Take  1 tablet (10 mg total) by mouth daily. 30 tablet 3   esomeprazole (NEXIUM) 20 MG capsule Take 20 mg by mouth daily.      glucose blood (FREESTYLE PRECISION NEO TEST) test strip DX: E11.9 Use to check blood sugar daily 50 strip 11   HYDROcodone-acetaminophen (NORCO) 10-325 MG tablet Take 1 tablet by mouth every 6 (six) hours as needed. 120 tablet 0   Lancets (FREESTYLE) lancets USE TO CHECK BLOOD SUGAR DAILY 100 each 2   lisinopril (ZESTRIL) 40 MG tablet TAKE 1 TABLET(40 MG) BY MOUTH DAILY 90 tablet 3   lisinopril (ZESTRIL) 40 MG tablet TAKE 1 TABLET(40 MG) BY MOUTH DAILY 90 tablet 3   ondansetron (ZOFRAN) 4 MG tablet Take 1 tablet (4 mg total) by mouth every 8 (eight) hours as needed for nausea or vomiting. 20 tablet 0   promethazine (PHENERGAN) 12.5 MG tablet TAKE 1 TABLET(12.5 MG) BY MOUTH EVERY 6 HOURS AS NEEDED FOR NAUSEA OR VOMITING 30 tablet 3   simvastatin (ZOCOR) 40 MG tablet TAKE 1 TABLET(40 MG) BY MOUTH AT BEDTIME 90 tablet 1   spironolactone (ALDACTONE) 25 MG tablet Take 0.5 tablets (12.5 mg total) by mouth daily. 90 tablet 3   triamcinolone cream (KENALOG) 0.1 % Apply 1 Application topically 2 (two) times daily. 30 g 0   No current facility-administered medications on file prior to visit.     Allergies  Allergen Reactions   Clindamycin/Lincomycin Rash   Doxycycline Other (See Comments)    Makes heart race   Keflex [Cephalexin] Nausea And Vomiting   Other    Sulfonic Acid (3,5-Dibromo-4-H-Ox-Benz)    Adhesive [Tape] Rash   Latex Rash   Sulfonamide Derivatives Nausea And Vomiting   Social History   Socioeconomic History   Marital status: Married    Spouse name: Lawyer   Number of children: 2   Years of education: 12   Highest education level: 12th grade  Occupational History   Occupation: Pensions consultant: RETIRED    Comment: Full time  Tobacco Use   Smoking status: Former    Years: 34.00    Types: Cigarettes    Quit date: 01/02/1994    Years  since quitting: 28.5    Passive exposure: Past   Smokeless tobacco: Never  Vaping Use   Vaping Use: Never used  Substance and Sexual Activity   Alcohol use: No   Drug use: No   Sexual activity: Not Currently    Partners: Male  Other Topics Concern   Not on file  Social History Narrative   Lives with spouse in [place]   One child deceased from homicide at age 39    Social Determinants of Health   Financial Resource Strain: [place]  (05/21/2022)   Overall Financial Resource Strain (CARDIA)    Difficulty of Paying Living Expenses: Not hard at  all  Food Insecurity: No Food Insecurity (05/21/2022)   Hunger Vital Sign    Worried About Running Out of Food in the Last Year: Never true    Ran Out of Food in the Last Year: Never true  Transportation Needs: No Transportation Needs (05/21/2022)   PRAPARE - Hydrologist (Medical): No    Lack of Transportation (Non-Medical): No  Physical Activity: Inactive (05/21/2022)   Exercise Vital Sign    Days of Exercise per Week: 0 days    Minutes of Exercise per Session: 0 min  Stress: Stress Concern Present (05/21/2022)   [place]    Feeling of Stress : To some extent  Social Connections: Moderately Integrated (05/21/2022)   Social Connection and Isolation Panel [NHANES]    Frequency of Communication with Friends and Family: More than three times a week    Frequency of Social Gatherings with Friends and Family: Once a week    Attends Religious Services: 1 to 4 times per year    Active Member of [employer] or Organizations: No    Attends Archivist Meetings: Never    Marital Status: Married  Human resources officer Violence: Not At Risk (05/21/2022)   Humiliation, Afraid, Rape, and Kick questionnaire    Fear of Current or Ex-Partner: No    Emotionally Abused: No    Physically Abused: No    Sexually Abused: No     Review of Systems  All  other systems reviewed and are negative.      Objective:   Physical Exam Vitals reviewed.  Constitutional:      General: She is not in acute distress.    Appearance: Normal appearance. She is not ill-appearing or toxic-appearing.  Cardiovascular:     Rate and Rhythm: Normal rate and regular rhythm.     Pulses: Normal pulses.     Heart sounds: Normal heart sounds. No murmur heard.    No friction rub. No gallop.  Pulmonary:     Effort: Pulmonary effort is normal. No respiratory distress.     Breath sounds: No stridor. No wheezing, rhonchi or rales.  Musculoskeletal:     Cervical back: Neck supple.  Feet:     Right foot:     Toenail Condition: Right toenails are abnormally thick and long. Fungal disease present.    Left foot:     Toenail Condition: Left toenails are abnormally thick and long. Fungal disease present. Neurological:     Mental Status: She is alert.           Assessment & Plan:   Acquired dysmorphic toenail I was able to cut all 10 of her toenails using rongeurs.  Patient tolerated the procedure well without any complication.

## 2022-07-19 DIAGNOSIS — M546 Pain in thoracic spine: Secondary | ICD-10-CM | POA: Diagnosis not present

## 2022-07-19 DIAGNOSIS — M9902 Segmental and somatic dysfunction of thoracic region: Secondary | ICD-10-CM | POA: Diagnosis not present

## 2022-07-19 DIAGNOSIS — M62830 Muscle spasm of back: Secondary | ICD-10-CM | POA: Diagnosis not present

## 2022-07-19 DIAGNOSIS — M542 Cervicalgia: Secondary | ICD-10-CM | POA: Diagnosis not present

## 2022-07-19 DIAGNOSIS — M9901 Segmental and somatic dysfunction of cervical region: Secondary | ICD-10-CM | POA: Diagnosis not present

## 2022-07-19 DIAGNOSIS — M9903 Segmental and somatic dysfunction of lumbar region: Secondary | ICD-10-CM | POA: Diagnosis not present

## 2022-07-27 ENCOUNTER — Other Ambulatory Visit: Payer: Self-pay | Admitting: Family Medicine

## 2022-07-29 NOTE — Telephone Encounter (Signed)
Requested Prescriptions  Pending Prescriptions Disp Refills   ELIQUIS 5 MG TABS tablet [Pharmacy Med Name: ELIQUIS '5MG'$  TABLETS] 60 tablet 3    Sig: TAKE 1 TABLET(5 MG) BY MOUTH TWICE DAILY     Hematology:  Anticoagulants - apixaban Passed - 07/27/2022  3:44 PM      Passed - PLT in normal range and within 360 days    Platelets  Date Value Ref Range Status  06/11/2022 199 140 - 400 Thousand/uL Final         Passed - HGB in normal range and within 360 days    Hemoglobin  Date Value Ref Range Status  06/11/2022 13.5 11.7 - 15.5 g/dL Final         Passed - HCT in normal range and within 360 days    HCT  Date Value Ref Range Status  06/11/2022 39.9 35.0 - 45.0 % Final         Passed - Cr in normal range and within 360 days    Creat  Date Value Ref Range Status  07/04/2022 0.94 0.60 - 1.00 mg/dL Final   Creatinine, Urine  Date Value Ref Range Status  06/11/2022 71 20 - 275 mg/dL Final         Passed - AST in normal range and within 360 days    AST  Date Value Ref Range Status  06/11/2022 11 10 - 35 U/L Final         Passed - ALT in normal range and within 360 days    ALT  Date Value Ref Range Status  06/11/2022 9 6 - 29 U/L Final         Passed - Valid encounter within last 12 months    Recent Outpatient Visits           8 months ago Erroneous encounter - disregard   [place] [name], MD   9 months ago Controlled type 2 diabetes mellitus with complication, without long-term current use of insulin ([place])   [place] Pickard, Cammie Mcgee, MD   10 months ago [place] Dennard Schaumann, Cammie Mcgee, MD   11 months ago [place], Cammie Mcgee, MD   11 months ago Chronic fatigue   [place], Cammie Mcgee, MD       Future Appointments             In 1 week Vickie Epley, MD [place] [facility] [place]

## 2022-07-31 ENCOUNTER — Telehealth: Payer: Self-pay | Admitting: Family Medicine

## 2022-07-31 NOTE — Telephone Encounter (Signed)
Prescription Request  07/31/2022  Is this a "Controlled Substance" medicine? Yes  LOV: 07/15/2022  What is the name of the medication or equipment? diazepam (VALIUM) 5 MG tablet   Have you contacted your pharmacy to request a refill? No   Which pharmacy would you like this sent to? [facility] on [address]   Patient notified that their request is being sent to the clinical staff for review and that they should receive a response within 2 business days.   Please advise at [place]

## 2022-08-01 ENCOUNTER — Other Ambulatory Visit: Payer: Self-pay | Admitting: Family Medicine

## 2022-08-01 MED ORDER — DIAZEPAM 5 MG PO TABS: 5.0000 mg | ORAL_TABLET | Freq: Every evening | ORAL | 0 refills | Status: DC | PRN

## 2022-08-02 ENCOUNTER — Other Ambulatory Visit: Payer: Self-pay | Admitting: Internal Medicine

## 2022-08-06 NOTE — Progress Notes (Unsigned)
Electrophysiology Office Follow up Visit Note:    Date:  08/06/2022   ID:  Erika Lee, DOB 31-Jul-1945, MRN [id number]  PCP:  Erika Frizzle, MD  [facility] Cardiologist:  None  [facility] Electrophysiologist:  Vickie Epley, MD    Interval History:    Erika Lee is a 77 y.o. female who presents for a follow up visit.  Hx of AF/AFL s/p prior ablation of veins and CTI in 2015. Previously followed by [name]. On eliquis.  Also hx of DM, GERD, HTN, NSTEMI.  Saw Renee 04/30/2022.        Past Medical History:  Diagnosis Date   Anticoagulant long-term use    eliquis   Chronic sinusitis    GERD (gastroesophageal reflux disease)    Heart failure with preserved ejection fraction ([place])    Hemorrhoids    History of colitis 10/2016   campylobacter   Hyperlipidemia    Hypertension    Multinodular goiter    per pt has had 2 biopsy's both benign   NSTEMI (non-ST elevated myocardial infarction) ([place])    12/2018- normal coronary arteries on cath, poss coronary vasospasm.    Osteoarthritis    "all over"  and [facility] joint left shoulder   Paroxysmal atrial fibrillation [facility]) EP cardiologist--  [name]   s/p PVI by [name] 07/16/2013;   pt first dx 2008,  recurrence 2014 afib/flutter and tachy-brady   Rotator cuff tear, left    Shoulder impingement, left    Status post placement of implantable loop recorder    original placement 07-26-2014;  removal / replacement 10-17-2017  by dr allred   Type 2 diabetes mellitus ([place])    followed by pcp    Past Surgical History:  Procedure Laterality Date   ATRIAL FIBRILLATION ABLATION N/A 07/16/2013   PVI and CTI ablation by [name]   CARDIAC CATHETERIZATION     01/11/2019- Normal coronary arteries.    COLONOSCOPY  09/10/2011   Dr. Arnoldo Morale: normal, 10 year follow up.   implantable loop recorder removal  04/25/2021   MDT LINQ removed by [name]   LEFT HEART CATH AND CORONARY ANGIOGRAPHY N/A 01/11/2019   Procedure:  LEFT HEART CATH AND CORONARY ANGIOGRAPHY;  Surgeon: Lorretta Harp, MD;  Location: [facility];  Service: Cardiovascular;  Laterality: N/A;   LESION REMOVAL N/A 05/03/2013   Procedure: EXCISION CYST, BACK;  Surgeon: Jamesetta So, MD;  Location: [facility];  Service: General;  Laterality: N/A;   LOOP RECORDER IMPLANT N/A 07/26/2014   Procedure: LOOP RECORDER IMPLANT;  Surgeon: Thompson Grayer, MD;  Location: [facility] CATH LAB;  Service: Cardiovascular;  Laterality: N/A;   LOOP RECORDER INSERTION N/A 10/17/2017   MDT previously implanted ILR for afib management at [facility].  old device removed and new device placed by [name]   LOOP RECORDER REMOVAL N/A 10/17/2017   MDT ILR removed with new device subsequently replaced   SHOULDER ARTHROSCOPY WITH ROTATOR CUFF REPAIR AND SUBACROMIAL DECOMPRESSION Left 01/07/2019   Procedure: Left shoulder subacromial decompression, distal clavicle resection, extensive debridement,;  Surgeon: Nicholes Stairs, MD;  Location: [facility];  Service: Orthopedics;  Laterality: Left;   TEE WITHOUT CARDIOVERSION N/A 07/15/2013   Procedure: TRANSESOPHAGEAL ECHOCARDIOGRAM (TEE);  Surgeon: Lelon Perla, MD;  Location: [facility] ENDOSCOPY;  Service: Cardiovascular;  Laterality: N/A;   TONSILLECTOMY  age 27   TRANSANAL HEMORRHOIDAL DEARTERIALIZATION N/A 09/22/2015   Procedure: TRANSANAL HEMORRHOIDAL LIGATION/PEXY EUA POSSIBLE HEMORRHOIDECTOMY ;  Surgeon: Michael Boston, MD;  Location: [facility];  Service: General;  Laterality: N/A;    Current Medications: No outpatient medications have been marked as taking for the 08/07/22 encounter (Appointment) with Vickie Epley, MD.     Allergies:   Clindamycin/lincomycin; Doxycycline; Keflex [cephalexin]; Other; Sulfonic acid (3,5-dibromo-4-h-ox-benz); Adhesive [tape]; Latex; and Sulfonamide derivatives   Social History   Socioeconomic History   Marital status: Married    Spouse name: Erika Lee   Number of  children: 2   Years of education: 12   Highest education level: 12th grade  Occupational History   Occupation: Pensions consultant: RETIRED    Comment: Full time  Tobacco Use   Smoking status: Former    Years: 34.00    Types: Cigarettes    Quit date: 01/02/1994    Years since quitting: 28.6    Passive exposure: Past   Smokeless tobacco: Never  Vaping Use   Vaping Use: Never used  Substance and Sexual Activity   Alcohol use: No   Drug use: No   Sexual activity: Not Currently    Partners: Male  Other Topics Concern   Not on file  Social History Narrative   Lives with spouse in [place]   One child deceased from homicide at age 74    Social Determinants of Health   Financial Resource Strain: [place]  (05/21/2022)   Overall Financial Resource Strain (CARDIA)    Difficulty of Paying Living Expenses: Not hard at all  Food Insecurity: No Food Insecurity (05/21/2022)   Hunger Vital Sign    Worried About Running Out of Food in the Last Year: Never true    Ran Out of Food in the Last Year: Never true  Transportation Needs: No Transportation Needs (05/21/2022)   PRAPARE - Hydrologist (Medical): No    Lack of Transportation (Non-Medical): No  Physical Activity: Inactive (05/21/2022)   Exercise Vital Sign    Days of Exercise per Week: 0 days    Minutes of Exercise per Session: 0 min  Stress: Stress Concern Present (05/21/2022)   [place]    Feeling of Stress : To some extent  Social Connections: Moderately Integrated (05/21/2022)   Social Connection and Isolation Panel [NHANES]    Frequency of Communication with Friends and Family: More than three times a week    Frequency of Social Gatherings with Friends and Family: Once a week    Attends Religious Services: 1 to 4 times per year    Active Member of [employer] or Organizations: No    Attends Archivist Meetings:  Never    Marital Status: Married     Family History: The patient's family history includes Kidney disease in her maternal grandfather; Other in her maternal grandfather; Stroke in her maternal grandmother; Stroke (age of onset: 14) in her mother. There is no history of Colon cancer.  ROS:   Please see the history of present illness.    All other systems reviewed and are negative.  EKGs/Labs/Other Studies Reviewed:    The following studies were reviewed today:  05/2022 echo - EF 65, RV normal, dilated LA/RA     Recent Labs: 04/11/2022: Brain Natriuretic Peptide 131 06/11/2022: ALT 9; Hemoglobin 13.5; Platelets 199 07/04/2022: BUN 16; Creat 0.94; Potassium 4.4; Sodium 135  Recent Lipid Panel    Component Value Date/Time   CHOL 126 06/11/2022 1106   TRIG 96 06/11/2022  1106   HDL 48 (L) 06/11/2022 1106   CHOLHDL 2.6 06/11/2022 1106   VLDL 16 01/10/2019 0308   LDLCALC 60 06/11/2022 1106    Physical Exam:    VS:  LMP  (LMP Unknown)     Wt Readings from Last 3 Encounters:  07/15/22 154 lb (69.9 kg)  06/28/22 146 lb (66.2 kg)  06/26/22 146 lb (66.2 kg)     GEN: *** Well nourished, well developed in no acute distress CARDIAC: ***RRR, no murmurs, rubs, gallops RESPIRATORY:  Clear to auscultation without rales, wheezing or rhonchi         ASSESSMENT:    1. Persistent atrial fibrillation ([place])   2. Atrial flutter, unspecified type ([place])   3. Primary hypertension   4. Chronic heart failure with preserved ejection fraction (HCC)    PLAN:    In order of problems listed above:  #Persistent AF/AFL On eliquis for stroke ppx.  #Hypertension *** goal today.  Recommend checking blood pressures 1-2 times per week at home and recording the values.  Recommend bringing these recordings to the primary care physician.  #HFpEF NYHA II. Continue current medical therapy.       Medication Adjustments/Labs and Tests Ordered: Current medicines are reviewed at length with  the patient today.  Concerns regarding medicines are outlined above.  No orders of the defined types were placed in this encounter.  No orders of the defined types were placed in this encounter.    Signed, Lars Mage, MD, [facility], [facility] 08/06/2022 7:04 PM    Electrophysiology [facility]

## 2022-08-07 ENCOUNTER — Ambulatory Visit: Payer: PPO | Attending: Cardiology | Admitting: Cardiology

## 2022-08-07 ENCOUNTER — Encounter: Payer: Self-pay | Admitting: Cardiology

## 2022-08-07 VITALS — BP 148/80 | HR 60 | Ht 65.0 in | Wt 151.0 lb

## 2022-08-07 DIAGNOSIS — I4892 Unspecified atrial flutter: Secondary | ICD-10-CM | POA: Diagnosis not present

## 2022-08-07 DIAGNOSIS — I10 Essential (primary) hypertension: Secondary | ICD-10-CM | POA: Diagnosis not present

## 2022-08-07 DIAGNOSIS — I5032 Chronic diastolic (congestive) heart failure: Secondary | ICD-10-CM

## 2022-08-07 DIAGNOSIS — I4819 Other persistent atrial fibrillation: Secondary | ICD-10-CM

## 2022-08-07 NOTE — Patient Instructions (Signed)
Medication Instructions:  Your physician recommends that you continue on your current medications as directed. Please refer to the Current Medication list given to you today.  *If you need a refill on your cardiac medications before your next appointment, please call your pharmacy*  Follow-Up: At [facility], you and your health needs are our priority.  As part of our continuing mission to provide you with exceptional heart care, we have created designated Provider Care Teams.  These Care Teams include your primary Cardiologist (physician) and Advanced Practice Providers (APPs -  Physician Assistants and Nurse Practitioners) who all work together to provide you with the care you need, when you need it.  Your next appointment:   1 year(s)  Provider:   You will see one of the following Advanced Practice Providers on your designated Care Team:   Renee Ursuy, PA-C Michael "Andy" Tillery, PA-C Suzann Riddle, NP  

## 2022-08-07 NOTE — Progress Notes (Signed)
Electrophysiology Office Follow up Visit Note:    Date:  08/07/2022   ID:  Erika Lee, DOB [birth year], MRN [id number]  PCP:  [name], MD  [facility] Cardiologist:  None  [facility] Electrophysiologist:  Vickie Epley, MD    Interval History:    Erika Lee is a 77 y.o. female who presents for a follow up visit.  Hx of AF/AFL s/p prior ablation of veins and CTI in 2015. Previously followed by [name]. On eliquis.  Also hx of DM, GERD, HTN, NSTEMI.  Saw Renee 04/30/2022.   Today, she reports that her heart rhythm has been fluctuating. She does not feel when she is in Afib. She has previously been told in 2015 that she has had two previous episodes of Afib, one lasting 3 minutes and the other 12 minutes.   She compliantly takes her Eliquis. She was recently started on Jardiance, but Metoprolol has been discontinued.  She denies any chest pain, shortness of breath, or peripheral edema. No lightheadedness, headaches, syncope, orthopnea, or PND.   Past Medical History:  Diagnosis Date   Anticoagulant long-term use    eliquis   Chronic sinusitis    GERD (gastroesophageal reflux disease)    Heart failure with preserved ejection fraction ([place])    Hemorrhoids    History of colitis 10/2016   campylobacter   Hyperlipidemia    Hypertension    Multinodular goiter    per pt has had 2 biopsy's both benign   NSTEMI (non-ST elevated myocardial infarction) ([place])    12/2018- normal coronary arteries on cath, poss coronary vasospasm.    Osteoarthritis    "all over"  and [facility] joint left shoulder   Paroxysmal atrial fibrillation [facility]) EP cardiologist--  [name]   s/p PVI by [name] 07/16/2013;   pt first dx 2008,  recurrence 2014 afib/flutter and tachy-brady   Rotator cuff tear, left    Shoulder impingement, left    Status post placement of implantable loop recorder    original placement 07-26-2014;  removal / replacement 10-17-2017  by dr allred   Type 2  diabetes mellitus ([place])    followed by pcp    Past Surgical History:  Procedure Laterality Date   ATRIAL FIBRILLATION ABLATION N/A 07/16/2013   PVI and CTI ablation by [name]   CARDIAC CATHETERIZATION     01/11/2019- Normal coronary arteries.    COLONOSCOPY  09/10/2011   Dr. Arnoldo Morale: normal, 10 year follow up.   implantable loop recorder removal  04/25/2021   MDT LINQ removed by [name]   LEFT HEART CATH AND CORONARY ANGIOGRAPHY N/A 01/11/2019   Procedure: LEFT HEART CATH AND CORONARY ANGIOGRAPHY;  Surgeon: Lorretta Harp, MD;  Location: [facility] CV LAB;  Service: Cardiovascular;  Laterality: N/A;   LESION REMOVAL N/A 05/03/2013   Procedure: EXCISION CYST, BACK;  Surgeon: Jamesetta So, MD;  Location: [facility];  Service: General;  Laterality: N/A;   LOOP RECORDER IMPLANT N/A 07/26/2014   Procedure: LOOP RECORDER IMPLANT;  Surgeon: Thompson Grayer, MD;  Location: [facility] CATH LAB;  Service: Cardiovascular;  Laterality: N/A;   LOOP RECORDER INSERTION N/A 10/17/2017   MDT previously implanted ILR for afib management at [facility].  old device removed and new device placed by [name]   LOOP RECORDER REMOVAL N/A 10/17/2017   MDT ILR removed with new device subsequently replaced   SHOULDER ARTHROSCOPY WITH ROTATOR CUFF REPAIR AND SUBACROMIAL DECOMPRESSION Left 01/07/2019   Procedure:  Left shoulder subacromial decompression, distal clavicle resection, extensive debridement,;  Surgeon: Nicholes Stairs, MD;  Location: [facility];  Service: Orthopedics;  Laterality: Left;   TEE WITHOUT CARDIOVERSION N/A 07/15/2013   Procedure: TRANSESOPHAGEAL ECHOCARDIOGRAM (TEE);  Surgeon: Lelon Perla, MD;  Location: [facility] ENDOSCOPY;  Service: Cardiovascular;  Laterality: N/A;   TONSILLECTOMY  age 1   TRANSANAL HEMORRHOIDAL DEARTERIALIZATION N/A 09/22/2015   Procedure: TRANSANAL HEMORRHOIDAL LIGATION/PEXY EUA POSSIBLE HEMORRHOIDECTOMY ;  Surgeon: Michael Boston, MD;  Location[facility];   Service: General;  Laterality: N/A;    Current Medications: Current Meds  Medication Sig   benzonatate (TESSALON) 200 MG capsule Take 200 mg by mouth 3 (three) times daily as needed for cough.   bumetanide (BUMEX) 2 MG tablet Take 1 tablet (2 mg total) by mouth daily. Stop furosemide   diazepam (VALIUM) 5 MG tablet Take 1 tablet (5 mg total) by mouth at bedtime as needed (sleep).   diclofenac Sodium (VOLTAREN) 1 % GEL Apply 2 g topically 4 (four) times daily.   ELIQUIS 5 MG TABS tablet TAKE 1 TABLET(5 MG) BY MOUTH TWICE DAILY   empagliflozin (JARDIANCE) 10 MG TABS tablet Take 1 tablet (10 mg total) by mouth daily.   esomeprazole (NEXIUM) 20 MG capsule Take 20 mg by mouth daily.    glucose blood (FREESTYLE PRECISION NEO TEST) test strip DX: E11.9 Use to check blood sugar daily   HYDROcodone-acetaminophen (NORCO) 10-325 MG tablet Take 1 tablet by mouth every 6 (six) hours as needed.   Lancets (FREESTYLE) lancets USE TO CHECK BLOOD SUGAR DAILY   lisinopril (ZESTRIL) 40 MG tablet TAKE 1 TABLET(40 MG) BY MOUTH DAILY   lisinopril (ZESTRIL) 40 MG tablet TAKE 1 TABLET(40 MG) BY MOUTH DAILY   ondansetron (ZOFRAN) 4 MG tablet Take 1 tablet (4 mg total) by mouth every 8 (eight) hours as needed for nausea or vomiting.   promethazine (PHENERGAN) 12.5 MG tablet TAKE 1 TABLET(12.5 MG) BY MOUTH EVERY 6 HOURS AS NEEDED FOR NAUSEA OR VOMITING   simvastatin (ZOCOR) 40 MG tablet TAKE 1 TABLET(40 MG) BY MOUTH AT BEDTIME   spironolactone (ALDACTONE) 25 MG tablet Take 0.5 tablets (12.5 mg total) by mouth daily.   triamcinolone cream (KENALOG) 0.1 % Apply 1 Application topically 2 (two) times daily.     Allergies:   Clindamycin/lincomycin; Doxycycline; Keflex [cephalexin]; Other; Sulfonic acid (3,5-dibromo-4-h-ox-benz); Adhesive [tape]; Latex; and Sulfonamide derivatives   Social History   Socioeconomic History   Marital status: Married    Spouse name: Lawyer   Number of children: 2   Years of  education: 12   Highest education level: 12th grade  Occupational History   Occupation: Pensions consultant: RETIRED    Comment: Full time  Tobacco Use   Smoking status: Former    Years: 34.00    Types: Cigarettes    Quit date: 01/02/1994    Years since quitting: 28.6    Passive exposure: Past   Smokeless tobacco: Never  Vaping Use   Vaping Use: Never used  Substance and Sexual Activity   Alcohol use: No   Drug use: No   Sexual activity: Not Currently    Partners: Male  Other Topics Concern   Not on file  Social History Narrative   Lives with spouse in [place]   One child deceased from homicide at age 37    Social Determinants of Health   Financial Resource Strain: [place]  (05/21/2022)   Overall Financial  Resource Strain (CARDIA)    Difficulty of Paying Living Expenses: Not hard at all  Food Insecurity: No Food Insecurity (05/21/2022)   Hunger Vital Sign    Worried About Running Out of Food in the Last Year: Never true    Ran Out of Food in the Last Year: Never true  Transportation Needs: No Transportation Needs (05/21/2022)   PRAPARE - Hydrologist (Medical): No    Lack of Transportation (Non-Medical): No  Physical Activity: Inactive (05/21/2022)   Exercise Vital Sign    Days of Exercise per Week: 0 days    Minutes of Exercise per Session: 0 min  Stress: Stress Concern Present (05/21/2022)   [place]    Feeling of Stress : To some extent  Social Connections: Moderately Integrated (05/21/2022)   Social Connection and Isolation Panel [NHANES]    Frequency of Communication with Friends and Family: More than three times a week    Frequency of Social Gatherings with Friends and Family: Once a week    Attends Religious Services: 1 to 4 times per year    Active Member of [employer] or Organizations: No    Attends Archivist Meetings: Never    Marital  Status: Married     Family History: The patient's family history includes Kidney disease in her maternal grandfather; Other in her maternal grandfather; Stroke in her maternal grandmother; Stroke (age of onset: 71) in her mother. There is no history of Colon cancer.  ROS:   Please see the history of present illness.     All other systems reviewed and are negative.  EKGs/Labs/Other Studies Reviewed:    The following studies were reviewed today:  05/2022 echo - EF 65, RV normal, dilated LA/RA     Recent Labs: 04/11/2022: Brain Natriuretic Peptide 131 06/11/2022: ALT 9; Hemoglobin 13.5; Platelets 199 07/04/2022: BUN 16; Creat 0.94; Potassium 4.4; Sodium 135  Recent Lipid Panel    Component Value Date/Time   CHOL 126 06/11/2022 1106   TRIG 96 06/11/2022 1106   HDL 48 (L) 06/11/2022 1106   CHOLHDL 2.6 06/11/2022 1106   VLDL 16 01/10/2019 0308   LDLCALC 60 06/11/2022 1106    Physical Exam:    VS:  BP (!) 148/80   Pulse 60   Ht '5\' 5"'$  (1.651 m)   Wt 151 lb (68.5 kg)   LMP  (LMP Unknown)   SpO2 98%   BMI 25.13 kg/m     Wt Readings from Last 3 Encounters:  08/07/22 151 lb (68.5 kg)  07/15/22 154 lb (69.9 kg)  06/28/22 146 lb (66.2 kg)     GEN:  Well nourished, well developed in no acute distress CARDIAC: RRR, no murmurs, rubs, gallops RESPIRATORY:  Clear to auscultation without rales, wheezing or rhonchi        ASSESSMENT:    1. Persistent atrial fibrillation ([place])   2. Atrial flutter, unspecified type ([place])   3. Primary hypertension   4. Chronic heart failure with preserved ejection fraction (HCC)    PLAN:    In order of problems listed above:  #Persistent AF/AFL On eliquis for stroke ppx.  #Hypertension Slightly above goal today.  Recommend checking blood pressures 1-2 times per week at home and recording the values.  Recommend bringing these recordings to the primary care physician.  #HFpEF NYHA II. Continue current medical therapy.  Follow-up:  1 year w APP  Medication Adjustments/Labs and Tests Ordered:  Current medicines are reviewed at length with the patient today.  Concerns regarding medicines are outlined above.  No orders of the defined types were placed in this encounter.  No orders of the defined types were placed in this encounter.   I,Mitra Faeizi,acting as a Education administrator for Vickie Epley, MD.,have documented all relevant documentation on the behalf of Vickie Epley, MD,as directed by  Vickie Epley, MD while in the presence of Vickie Epley, MD.  I, Vickie Epley, MD, have reviewed all documentation for this visit. The documentation on 08/07/22 for the exam, diagnosis, procedures, and orders are all accurate and complete.   Signed, Lars Mage, MD, [facility], [facility] 08/07/2022 3:28 PM    Electrophysiology [facility]

## 2022-08-08 ENCOUNTER — Encounter (HOSPITAL_COMMUNITY): Payer: Self-pay | Admitting: *Deleted

## 2022-08-19 ENCOUNTER — Telehealth: Payer: Self-pay | Admitting: Family Medicine

## 2022-08-19 NOTE — Telephone Encounter (Signed)
Prescription Request  08/19/2022  Is this a "Controlled Substance" medicine? Yes  LOV: 07/15/2022  What is the name of the medication or equipment?   HYDROcodone-acetaminophen (NORCO) 10-325 MG tablet   Have you contacted your pharmacy to request a refill? Yes   Which pharmacy would you like this sent to?   [id number] - [place], [address] [place], [place] [address] Phone: [phone]  Fax: [phone] DEA #: [id number]   Patient notified that their request is being sent to the clinical staff for review and that they should receive a response within 2 business days.   Please advise patient when script sent at [phone].

## 2022-08-20 ENCOUNTER — Other Ambulatory Visit: Payer: Self-pay | Admitting: Family Medicine

## 2022-08-20 MED ORDER — HYDROCODONE-ACETAMINOPHEN 10-325 MG PO TABS: 1.0000 | ORAL_TABLET | Freq: Four times a day (QID) | ORAL | 0 refills | Status: DC | PRN

## 2022-08-22 ENCOUNTER — Telehealth: Payer: Self-pay | Admitting: Pharmacist

## 2022-08-22 NOTE — Progress Notes (Signed)
Care Management & Coordination Services Pharmacy Team   Reason for Encounter: Hypertension   Contacted patient to discuss hypertension disease state. Unsuccessful outreach. Left voicemail for patient to return call.    Current antihypertensive regimen:    Patient verbally confirms she is taking the above medications as directed.   How often are you checking your Blood Pressure?   she checks her blood pressure   taking her medication.  Current home BP readings:   DATE:             BP               PULSE   Wrist or arm cuff: Caffeine intake: Salt intake: OTC medications including pseudoephedrine or NSAIDs?  Any readings above 180/100?  If yes any symptoms of hypertensive emergency?   What recent interventions/DTPs have been made by any provider to improve Blood Pressure control since last CPP Visit:    Any recent hospitalizations or ED visits since last visit with CPP? No  What diet changes have been made to improve Blood Pressure Control?  Patient reported  What exercise is being done to improve your Blood Pressure Control?  Patient reported  Adherence Review: Is the patient currently on ACE/ARB medication? Yes Does the patient have >5 day gap between last estimated fill dates? No  Star Rating Drugs:  Medication:    Last Fill: Day Supply  simvastatin 40 MG tablet   08/05/22  90 lisinopril 40 MG tablet   07/12/22 90 empagliflozin 10 MG TABS tablet  07/28/22 30  Chart Updates: Recent office visits:  07/15/22 [name], MD - Family Medicine - Dysmorphic toenail - Cut all 10 toenails in office without complication. Follow up as scheduled.   06/28/22 [name], MD - Acute bacterial rhinosinusitis - amoxicillin-clavulanate (AUGMENTIN) 875-125 MG tablet prescribed. Discontinue Levaquin. Follow up as needed.   06/26/22 Mila Merry, NP - Family Medicine - Acute bacterial rhinosinusitis - levofloxacin (LEVAQUIN) 500 MG tablet and benzonatate (TESSALON) 200 MG  capsule prescribed. Follow up as needed.   06/11/22 Annual Medicare Wellness Completed.   04/18/22 [name], MD - Family Medicine - CHF - Labs were ordered. Spironolactone added to regimen. Follow up as scheduled.   04/15/22 [name], MD - Family Medicine - Leg swelling - Flu vaccination administered. Follow up as scheduled.   04/11/22 [name], MD - Family Medicine - Afib - Labs were ordered. bumetanide (BUMEX) 2 MG tablet and   triamcinolone cream (KENALOG) 0.1 %   Recent consult visits:  08/07/22 Lars Mage, MD - Cardiology - A fib - No medication changes. Follow up in 1 year.   04/30/22 Tommye Standard PA-C - Cardiology - CHF - ECHO and EKG ordered.   Hospital visits:  None in previous 6 months  Medications: Outpatient Encounter Medications as of 08/22/2022  Medication Sig   benzonatate (TESSALON) 200 MG capsule Take 200 mg by mouth 3 (three) times daily as needed for cough.   bumetanide (BUMEX) 2 MG tablet Take 1 tablet (2 mg total) by mouth daily. Stop furosemide   diazepam (VALIUM) 5 MG tablet Take 1 tablet (5 mg total) by mouth at bedtime as needed (sleep).   diclofenac Sodium (VOLTAREN) 1 % GEL Apply 2 g topically 4 (four) times daily.   ELIQUIS 5 MG TABS tablet TAKE 1 TABLET(5 MG) BY MOUTH TWICE DAILY   empagliflozin (JARDIANCE) 10 MG TABS tablet Take 1 tablet (10 mg total) by mouth daily.   esomeprazole (NEXIUM) 20 MG  capsule Take 20 mg by mouth daily.    glucose blood (FREESTYLE PRECISION NEO TEST) test strip DX: E11.9 Use to check blood sugar daily   HYDROcodone-acetaminophen (NORCO) 10-325 MG tablet Take 1 tablet by mouth every 6 (six) hours as needed.   Lancets (FREESTYLE) lancets USE TO CHECK BLOOD SUGAR DAILY   lisinopril (ZESTRIL) 40 MG tablet TAKE 1 TABLET(40 MG) BY MOUTH DAILY   lisinopril (ZESTRIL) 40 MG tablet TAKE 1 TABLET(40 MG) BY MOUTH DAILY   ondansetron (ZOFRAN) 4 MG tablet Take 1 tablet (4 mg total) by mouth every 8 (eight) hours as  needed for nausea or vomiting.   promethazine (PHENERGAN) 12.5 MG tablet TAKE 1 TABLET(12.5 MG) BY MOUTH EVERY 6 HOURS AS NEEDED FOR NAUSEA OR VOMITING   simvastatin (ZOCOR) 40 MG tablet TAKE 1 TABLET(40 MG) BY MOUTH AT BEDTIME   spironolactone (ALDACTONE) 25 MG tablet Take 0.5 tablets (12.5 mg total) by mouth daily.   triamcinolone cream (KENALOG) 0.1 % Apply 1 Application topically 2 (two) times daily.   No facility-administered encounter medications on file as of 08/22/2022.    Recent Office Vitals: BP Readings from Last 3 Encounters:  08/07/22 (!) 148/80  07/15/22 (!) 114/56  06/28/22 (!) 110/54   Pulse Readings from Last 3 Encounters:  08/07/22 60  07/15/22 62  06/28/22 (!) 53    Wt Readings from Last 3 Encounters:  08/07/22 151 lb (68.5 kg)  07/15/22 154 lb (69.9 kg)  06/28/22 146 lb (66.2 kg)     Kidney Function Lab Results  Component Value Date/Time   CREATININE 0.94 07/04/2022 02:41 PM   CREATININE 0.95 06/11/2022 11:06 AM   GFR 72.30 07/22/2013 01:07 PM   GFRNONAA >60 07/23/2021 05:56 PM   GFRNONAA 68 10/23/2020 12:32 PM   GFRAA 78 10/23/2020 12:32 PM       Latest Ref Rng & Units 07/04/2022    2:41 PM 06/11/2022   11:06 AM 04/18/2022   12:28 PM  BMP  Glucose 65 - 99 mg/dL 100  110  154   BUN 7 - 25 mg/dL '16  16  16   '$ Creatinine 0.60 - 1.00 mg/dL 0.94  0.95  0.87   BUN/Creat Ratio 6 - 22 (calc) SEE NOTE:  SEE NOTE:  SEE NOTE:   Sodium 135 - 146 mmol/L 135  139  136   Potassium 3.5 - 5.3 mmol/L 4.4  4.2  3.8   Chloride 98 - 110 mmol/L 97  103  97   CO2 20 - 32 mmol/L 29  28  32   Calcium 8.6 - 10.4 mg/dL 9.9  10.3  10.1     Future Appointments  Date Time Provider [place]  05/27/2023 10:30 AM [facility]-NURSE HEALTH ADVISOR [facility] [place] [place]

## 2022-08-30 ENCOUNTER — Telehealth: Payer: Self-pay | Admitting: Family Medicine

## 2022-08-30 ENCOUNTER — Other Ambulatory Visit: Payer: Self-pay | Admitting: Family Medicine

## 2022-08-30 MED ORDER — DIAZEPAM 5 MG PO TABS: 5.0000 mg | ORAL_TABLET | Freq: Every evening | ORAL | 0 refills | Status: DC | PRN

## 2022-08-30 NOTE — Telephone Encounter (Signed)
Prescription Request  08/30/2022   LOV: 07/15/2022  What is the name of the medication or equipment?   diazepam (VALIUM) 5 MG tablet  **PATIENT HAS ONLY ONE PILL LEFT**  Have you contacted your pharmacy to request a refill? Yes   Which pharmacy would you like this sent to?   [id number] - [place], [address], [place] [address] Phone: [phone]  Fax: [phone] DEA #: [id number]    Patient notified that their request is being sent to the clinical staff for review and that they should receive a response within 2 business days.   Please advise patient when refill sent in at [phone].

## 2022-09-03 DIAGNOSIS — M1712 Unilateral primary osteoarthritis, left knee: Secondary | ICD-10-CM | POA: Diagnosis not present

## 2022-09-03 DIAGNOSIS — M1711 Unilateral primary osteoarthritis, right knee: Secondary | ICD-10-CM | POA: Diagnosis not present

## 2022-09-03 DIAGNOSIS — M170 Bilateral primary osteoarthritis of knee: Secondary | ICD-10-CM | POA: Diagnosis not present

## 2022-09-09 ENCOUNTER — Other Ambulatory Visit: Payer: Self-pay

## 2022-09-09 MED ORDER — FREESTYLE LANCETS MISC: 5 refills | Status: DC

## 2022-09-13 ENCOUNTER — Telehealth: Payer: Self-pay | Admitting: Family Medicine

## 2022-09-13 ENCOUNTER — Other Ambulatory Visit: Payer: Self-pay

## 2022-09-13 DIAGNOSIS — E118 Type 2 diabetes mellitus with unspecified complications: Secondary | ICD-10-CM

## 2022-09-13 MED ORDER — FREESTYLE PRECISION NEO TEST VI STRP: ORAL_STRIP | 11 refills | Status: DC

## 2022-09-13 NOTE — Telephone Encounter (Signed)
Prescription Request  09/13/2022  LOV: 07/15/2022  What is the name of the medication or equipment?   glucose blood (FREESTYLE PRECISION NEO TEST) test strip   Have you contacted your pharmacy to request a refill? Yes   Which pharmacy would you like this sent to?   [id number] - [place], [place] [address] [place] [address] Phone: [phone] Fax: [phone]    Patient notified that their request is being sent to the clinical staff for review and that they should receive a response within 2 business days.   Please advise patient at [phone] when script sent in.

## 2022-09-27 ENCOUNTER — Other Ambulatory Visit: Payer: Self-pay | Admitting: Family Medicine

## 2022-09-27 DIAGNOSIS — E118 Type 2 diabetes mellitus with unspecified complications: Secondary | ICD-10-CM

## 2022-09-27 NOTE — Telephone Encounter (Signed)
Unable to refill per protocol, Rx request is too soon. Last refill 07/29/22 for 30 and 3 refills. Next refill due May.  Requested Prescriptions  Pending Prescriptions Disp Refills   JARDIANCE 10 MG TABS tablet [Pharmacy Med Name: JARDIANCE 10MG  TABLETS] 30 tablet 3    Sig: TAKE 1 TABLET(10 MG) BY MOUTH DAILY     Endocrinology:  Diabetes - SGLT2 Inhibitors Failed - 09/27/2022 12:04 PM      Failed - Valid encounter within last 6 months    Recent Outpatient Visits           10 months ago Erroneous encounter - disregard   [place] [name], MD   11 months ago Controlled type 2 diabetes mellitus with complication, without long-term current use of insulin ([place])   [place] Pickard, Cammie Mcgee, MD   1 year ago [place] [name], MD   1 year ago [place] [name], MD   1 year ago Chronic fatigue   [place] Pickard, Cammie Mcgee, MD              Passed - Cr in normal range and within 360 days    Creat  Date Value Ref Range Status  07/04/2022 0.94 0.60 - 1.00 mg/dL Final   Creatinine, Urine  Date Value Ref Range Status  06/11/2022 71 20 - 275 mg/dL Final         Passed - HBA1C is between 0 and 7.9 and within 180 days    Hgb A1c MFr Bld  Date Value Ref Range Status  04/11/2022 6.5 (H) <5.7 % of total Hgb Final    Comment:    For someone without known diabetes, a hemoglobin A1c value of 6.5% or greater indicates that they may have  diabetes and this should be confirmed with a follow-up  test. . For someone with known diabetes, a value <7% indicates  that their diabetes is well controlled and a value  greater than or equal to 7% indicates suboptimal  control. A1c targets should be individualized based on  duration of diabetes, age, comorbid conditions, and  other considerations. . Currently, no consensus exists regarding use  of hemoglobin A1c for diagnosis of diabetes for children. .          Passed - eGFR in normal range and within 360 days    GFR, Est African American  Date Value Ref Range Status  10/23/2020 78 > OR = 60 mL/min/1.12m2 Final   GFR, Est Non African American  Date Value Ref Range Status  10/23/2020 68 > OR = 60 mL/min/1.28m2 Final   GFR, Estimated  Date Value Ref Range Status  07/23/2021 >60 >60 mL/min Final    Comment:    (NOTE) Calculated using the CKD-EPI Creatinine Equation (2021)    GFR  Date Value Ref Range Status  07/22/2013 72.30 >60.00 mL/min Final   eGFR  Date Value Ref Range Status  06/11/2022 61 > OR = 60 mL/min/1.79m2 Final

## 2022-10-01 ENCOUNTER — Telehealth: Payer: Self-pay

## 2022-10-01 ENCOUNTER — Other Ambulatory Visit: Payer: Self-pay | Admitting: Family Medicine

## 2022-10-01 DIAGNOSIS — M170 Bilateral primary osteoarthritis of knee: Secondary | ICD-10-CM | POA: Diagnosis not present

## 2022-10-01 MED ORDER — HYDROCODONE-ACETAMINOPHEN 10-325 MG PO TABS: 1.0000 | ORAL_TABLET | Freq: Four times a day (QID) | ORAL | 0 refills | Status: DC | PRN

## 2022-10-01 NOTE — Telephone Encounter (Signed)
Prescription Request  10/01/2022  LOV: 07/15/22  What is the name of the medication or equipment? HYDROcodone-acetaminophen (NORCO) 10-325 MG tablet  Have you contacted your pharmacy to request a refill? No   Which pharmacy would you like this sent to?   [id number] - [place], [address] [place], [place] [address]    Patient notified that their request is being sent to the clinical staff for review and that they should receive a response within 2 business days.   Please advise at [facility] [phone]  Prescription Request  10/01/2022  LOV: Visit date not found  What is the name of the medication or equipment? diazepam (VALIUM) 5 MG tablet [id number]   Have you contacted your pharmacy to request a refill? No   Which pharmacy would you like this sent to?   [id number] - , [place], [place] [address] Phone: [phone]  Fax: [phone]    Patient notified that their request is being sent to the clinical staff for review and that they should receive a response within 2 business days.   Please advise at [facility]

## 2022-10-02 ENCOUNTER — Other Ambulatory Visit: Payer: Self-pay | Admitting: Family Medicine

## 2022-10-02 NOTE — Telephone Encounter (Signed)
Requested medication (s) are due for refill today: yes  Requested medication (s) are on the active medication list: yes  Last refill:  08/30/22 #30/0  Future visit scheduled: no  Notes to clinic:  Unable to refill per protocol, cannot delegate. Had CPE 05/2022    Requested Prescriptions  Pending Prescriptions Disp Refills   diazepam (VALIUM) 5 MG tablet [Pharmacy Med Name: DIAZEPAM 5MG  TABLETS] 30 tablet     Sig: TAKE 1 TABLET(5 MG) BY MOUTH AT BEDTIME AS NEEDED FOR SLEEP     Not Delegated - Psychiatry: Anxiolytics/Hypnotics 2 Failed - 10/02/2022  4:19 PM      Failed - This refill cannot be delegated      Failed - Urine Drug Screen completed in last 360 days      Failed - Valid encounter within last 6 months    Recent Outpatient Visits           10 months ago Erroneous encounter - disregard   [place] [name], MD   11 months ago Controlled type 2 diabetes mellitus with complication, without long-term current use of insulin ([place])   [place] Pickard, Cammie Mcgee, MD   1 year ago [place] [name], MD   1 year ago [place], Warren T, MD   1 year ago Chronic fatigue   [place], Cammie Mcgee, MD              Passed - Patient is not pregnant

## 2022-10-09 DIAGNOSIS — M170 Bilateral primary osteoarthritis of knee: Secondary | ICD-10-CM | POA: Diagnosis not present

## 2022-10-15 ENCOUNTER — Other Ambulatory Visit: Payer: Self-pay | Admitting: Family Medicine

## 2022-10-15 DIAGNOSIS — K838 Other specified diseases of biliary tract: Secondary | ICD-10-CM

## 2022-10-15 DIAGNOSIS — R112 Nausea with vomiting, unspecified: Secondary | ICD-10-CM

## 2022-10-15 DIAGNOSIS — M546 Pain in thoracic spine: Secondary | ICD-10-CM | POA: Diagnosis not present

## 2022-10-15 DIAGNOSIS — M170 Bilateral primary osteoarthritis of knee: Secondary | ICD-10-CM | POA: Diagnosis not present

## 2022-10-22 ENCOUNTER — Other Ambulatory Visit: Payer: Self-pay | Admitting: Family Medicine

## 2022-10-22 ENCOUNTER — Telehealth: Payer: Self-pay

## 2022-10-22 MED ORDER — BUMETANIDE 2 MG PO TABS: 2.0000 mg | ORAL_TABLET | Freq: Every day | ORAL | 3 refills | Status: DC

## 2022-10-22 NOTE — Telephone Encounter (Signed)
Pt called in to request a refill:  Prescription Request  10/22/2022  LOV: 07/15/22  What is the name of the medication or equipment? bumetanide (BUMEX) 2 MG tablet  Have you contacted your pharmacy to request a refill? No   Which pharmacy would you like this sent to? [id number] - [place], [place] - [address],  [place] [address] Phone: [phone]  Fax: [phone]      Patient notified that their request is being sent to the clinical staff for review and that they should receive a response within 2 business days.   Please advise at [facility] [phone]

## 2022-10-28 ENCOUNTER — Other Ambulatory Visit: Payer: Self-pay | Admitting: Family Medicine

## 2022-10-28 DIAGNOSIS — E118 Type 2 diabetes mellitus with unspecified complications: Secondary | ICD-10-CM

## 2022-10-29 NOTE — Telephone Encounter (Signed)
Requested Prescriptions  Pending Prescriptions Disp Refills   JARDIANCE 10 MG TABS tablet [Pharmacy Med Name: JARDIANCE 10MG  TABLETS] 30 tablet 3    Sig: TAKE 1 TABLET(10 MG) BY MOUTH DAILY     Endocrinology:  Diabetes - SGLT2 Inhibitors Failed - 10/28/2022  2:04 PM      Failed - HBA1C is between 0 and 7.9 and within 180 days    Hgb A1c MFr Bld  Date Value Ref Range Status  04/11/2022 6.5 (H) <5.7 % of total Hgb Final    Comment:    For someone without known diabetes, a hemoglobin A1c value of 6.5% or greater indicates that they may have  diabetes and this should be confirmed with a follow-up  test. . For someone with known diabetes, a value <7% indicates  that their diabetes is well controlled and a value  greater than or equal to 7% indicates suboptimal  control. A1c targets should be individualized based on  duration of diabetes, age, comorbid conditions, and  other considerations. . Currently, no consensus exists regarding use of hemoglobin A1c for diagnosis of diabetes for children. .          Failed - Valid encounter within last 6 months    Recent Outpatient Visits           11 months ago Erroneous encounter - disregard   [facility] Donita Brooks, MD   1 year ago Controlled type 2 diabetes mellitus with complication, without long-term current use of insulin (H[facility])   [facility] Pickard, Priscille Heidelberg, MD   1 year ago Dysuria   [facility] Pickard, Priscille Heidelberg, MD   1 year ago Dysuria   [facility] Tanya Nones, Priscille Heidelberg, MD   1 year ago Chronic fatigue   [facility] Tanya Nones, Priscille Heidelberg, MD              Passed - Cr in normal range and within 360 days    Creat  Date Value Ref Range Status  07/04/2022 0.94 0.60 - 1.00 mg/dL Final   Creatinine, Urine  Date Value Ref Range Status  06/11/2022 71 20 - 275 mg/dL Final         Passed - eGFR in normal range and within 360 days    GFR, Est  African American  Date Value Ref Range Status  10/23/2020 78 > OR = 60 mL/min/1.35m2 Final   GFR, Est Non African American  Date Value Ref Range Status  10/23/2020 68 > OR = 60 mL/min/1.9m2 Final   GFR, Estimated  Date Value Ref Range Status  07/23/2021 >60 >60 mL/min Final    Comment:    (NOTE) Calculated using the CKD-EPI Creatinine Equation (2021)    GFR  Date Value Ref Range Status  07/22/2013 72.30 >60.00 mL/min Final   eGFR  Date Value Ref Range Status  06/11/2022 61 > OR = 60 mL/min/1.54m2 Final

## 2022-11-01 ENCOUNTER — Encounter: Payer: Self-pay | Admitting: Family Medicine

## 2022-11-01 ENCOUNTER — Ambulatory Visit (INDEPENDENT_AMBULATORY_CARE_PROVIDER_SITE_OTHER): Payer: PPO | Admitting: Family Medicine

## 2022-11-01 VITALS — BP 118/72 | HR 54 | Temp 97.4°F | Ht 65.0 in | Wt 147.0 lb

## 2022-11-01 DIAGNOSIS — I5030 Unspecified diastolic (congestive) heart failure: Secondary | ICD-10-CM | POA: Diagnosis not present

## 2022-11-01 DIAGNOSIS — I10 Essential (primary) hypertension: Secondary | ICD-10-CM | POA: Diagnosis not present

## 2022-11-01 DIAGNOSIS — E118 Type 2 diabetes mellitus with unspecified complications: Secondary | ICD-10-CM | POA: Diagnosis not present

## 2022-11-01 DIAGNOSIS — I2510 Atherosclerotic heart disease of native coronary artery without angina pectoris: Secondary | ICD-10-CM

## 2022-11-01 DIAGNOSIS — Z7984 Long term (current) use of oral hypoglycemic drugs: Secondary | ICD-10-CM

## 2022-11-01 NOTE — Progress Notes (Signed)
Subjective:    Patient ID: Erika Lee, female    DOB: 1946/01/30, 77 y.o.   MRN: [id number] Patient has a history of coronary artery disease, type 2 diabetes, hypertension, hyperlipidemia, and diastolic heart failure.  She is currently doing quite well.  She denies any chest pain or shortness of breath.  She has trace edema in her extremities but no evidence of fluid overload.  She is tolerating Jardiance well without dysuria, frequency, or urgency.  Her husband is still on hospice and she is dealing with the stress of caring for her husband.  Final stages of his life.  Her blood pressure today is well-controlled.  It is 119/72. Past Medical History:  Diagnosis Date   Anticoagulant long-term use    eliquis   Chronic sinusitis    GERD (gastroesophageal reflux disease)    Heart failure with preserved ejection fraction (HCC)    Hemorrhoids    History of colitis 10/2016   campylobacter   Hyperlipidemia    Hypertension    Multinodular goiter    per pt has had 2 biopsy's both benign   NSTEMI (non-ST elevated myocardial infarction) (HCC)    12/2018- normal coronary arteries on cath, poss coronary vasospasm.    Osteoarthritis    "all over"  and [facility] joint left shoulder   Paroxysmal atrial fibrillation [facility]) EP cardiologist--  dr Johney Frame   s/p PVI by Dr Johney Frame 07/16/2013;   pt first dx 2008,  recurrence 2014 afib/flutter and tachy-brady   Rotator cuff tear, left    Shoulder impingement, left    Status post placement of implantable loop recorder    original placement 07-26-2014;  removal / replacement 10-17-2017  by dr allred   Type 2 diabetes mellitus (HCC)    followed by pcp   Past Surgical History:  Procedure Laterality Date   ATRIAL FIBRILLATION ABLATION N/A 07/16/2013   PVI and CTI ablation by Dr Johney Frame   CARDIAC CATHETERIZATION     01/11/2019- Normal coronary arteries.    COLONOSCOPY  09/10/2011   Dr. Lovell Sheehan: normal, 10 year follow up.   implantable loop recorder removal   04/25/2021   MDT LINQ removed by Dr Johney Frame   LEFT HEART CATH AND CORONARY ANGIOGRAPHY N/A 01/11/2019   Procedure: LEFT HEART CATH AND CORONARY ANGIOGRAPHY;  Surgeon: Runell Gess, MD;  [facility]: [facility];  Service: Cardiovascular;  Laterality: N/A;   LESION REMOVAL N/A 05/03/2013   Procedure: EXCISION CYST, BACK;  Surgeon: Dalia Heading, MD;  Location: [facility];  Service: General;  Laterality: N/A;   LOOP RECORDER IMPLANT N/A 07/26/2014   Procedure: LOOP RECORDER IMPLANT;  Surgeon: Hillis Range, MD;  Location: [facility] CATH LAB;  Service: Cardiovascular;  Laterality: N/A;   LOOP RECORDER INSERTION N/A 10/17/2017   MDT previously implanted ILR for afib management at [facility].  old device removed and new device placed by Dr Johney Frame   LOOP RECORDER REMOVAL N/A 10/17/2017   MDT ILR removed with new device subsequently replaced   SHOULDER ARTHROSCOPY WITH ROTATOR CUFF REPAIR AND SUBACROMIAL DECOMPRESSION Left 01/07/2019   Procedure: Left shoulder subacromial decompression, distal clavicle resection, extensive debridement,;  Surgeon: Yolonda Kida, MD;  Location: [facility];  Service: Orthopedics;  Laterality: Left;   TEE WITHOUT CARDIOVERSION N/A 07/15/2013   Procedure: TRANSESOPHAGEAL ECHOCARDIOGRAM (TEE);  Surgeon: Lewayne Bunting, MD;  Location: [facility] ENDOSCOPY;  Service: Cardiovascular;  Laterality: N/A;   TONSILLECTOMY  age 60   TRANSANAL HEMORRHOIDAL DEARTERIALIZATION  N/A 09/22/2015   Procedure: TRANSANAL HEMORRHOIDAL LIGATION/PEXY EUA POSSIBLE HEMORRHOIDECTOMY ;  Surgeon: Karie Soda, MD;  Location: [facility];  Service: General;  Laterality: N/A;   Current Outpatient Medications on File Prior to Visit  Medication Sig Dispense Refill   benzonatate (TESSALON) 200 MG capsule Take 200 mg by mouth 3 (three) times daily as needed for cough.     bumetanide (BUMEX) 2 MG tablet Take 1 tablet (2 mg total) by mouth daily. Stop furosemide 30 tablet 3   diazepam (VALIUM) 5 MG  tablet TAKE 1 TABLET(5 MG) BY MOUTH AT BEDTIME AS NEEDED FOR SLEEP 30 tablet 3   diclofenac Sodium (VOLTAREN) 1 % GEL Apply 2 g topically 4 (four) times daily. 100 g 2   ELIQUIS 5 MG TABS tablet TAKE 1 TABLET(5 MG) BY MOUTH TWICE DAILY 60 tablet 3   esomeprazole (NEXIUM) 20 MG capsule Take 20 mg by mouth daily.      glucose blood (FREESTYLE PRECISION NEO TEST) test strip DX: E11.9 Use to check blood sugar daily 100 strip 11   HYDROcodone-acetaminophen (NORCO) 10-325 MG tablet Take 1 tablet by mouth every 6 (six) hours as needed. 120 tablet 0   JARDIANCE 10 MG TABS tablet TAKE 1 TABLET(10 MG) BY MOUTH DAILY 30 tablet 3   Lancets (FREESTYLE) lancets Use as instructed 100 each 5   lisinopril (ZESTRIL) 40 MG tablet TAKE 1 TABLET(40 MG) BY MOUTH DAILY 90 tablet 3   lisinopril (ZESTRIL) 40 MG tablet TAKE 1 TABLET(40 MG) BY MOUTH DAILY 90 tablet 3   ondansetron (ZOFRAN) 4 MG tablet Take 1 tablet (4 mg total) by mouth every 8 (eight) hours as needed for nausea or vomiting. 20 tablet 0   promethazine (PHENERGAN) 12.5 MG tablet TAKE 1 TABLET(12.5 MG) BY MOUTH EVERY 6 HOURS AS NEEDED FOR NAUSEA OR VOMITING 30 tablet 3   simvastatin (ZOCOR) 40 MG tablet TAKE 1 TABLET(40 MG) BY MOUTH AT BEDTIME 90 tablet 1   spironolactone (ALDACTONE) 25 MG tablet Take 0.5 tablets (12.5 mg total) by mouth daily. 90 tablet 3   triamcinolone cream (KENALOG) 0.1 % Apply 1 Application topically 2 (two) times daily. 30 g 0   No current facility-administered medications on file prior to visit.     Allergies  Allergen Reactions   Clindamycin/Lincomycin Rash   Doxycycline Other (See Comments)    Makes heart race   Keflex [Cephalexin] Nausea And Vomiting   Other    Sulfonic Acid (3,5-Dibromo-4-H-Ox-Benz)    Adhesive [Tape] Rash   Latex Rash   Sulfonamide Derivatives Nausea And Vomiting   Social History   Socioeconomic History   Marital status: Married    Spouse name: Interior and spatial designer   Number of children: 2   Years of  education: 12   Highest education level: 12th grade  Occupational History   Occupation: Advertising copywriter: RETIRED    Comment: Full time  Tobacco Use   Smoking status: Former    Years: 34    Types: Cigarettes    Quit date: 01/02/1994    Years since quitting: 28.8    Passive exposure: Past   Smokeless tobacco: Never  Vaping Use   Vaping Use: Never used  Substance and Sexual Activity   Alcohol use: No   Drug use: No   Sexual activity: Not Currently    Partners: Male  Other Topics Concern   Not on file  Social History Narrative   Lives with spouse in [place]   One  child deceased from homicide at age 18    Social Determinants of Health   Financial Resource Strain: Low Risk  (05/21/2022)   Overall Financial Resource Strain (CARDIA)    Difficulty of Paying Living Expenses: Not hard at all  Food Insecurity: No Food Insecurity (05/21/2022)   Hunger Vital Sign    Worried About Running Out of Food in the Last Year: Never true    Ran Out of Food in the Last Year: Never true  Transportation Needs: No Transportation Needs (05/21/2022)   PRAPARE - Administrator, Civil Service (Medical): No    Lack of Transportation (Non-Medical): No  Physical Activity: Inactive (05/21/2022)   Exercise Vital Sign    Days of Exercise per Week: 0 days    Minutes of Exercise per Session: 0 min  Stress: Stress Concern Present (05/21/2022)   [employer] of Occupational Health - Occupational Stress Questionnaire    Feeling of Stress : To some extent  Social Connections: Moderately Integrated (05/21/2022)   Social Connection and Isolation Panel [NHANES]    Frequency of Communication with Friends and Family: More than three times a week    Frequency of Social Gatherings with Friends and Family: Once a week    Attends Religious Services: 1 to 4 times per year    Active Member of [employer] or Organizations: No    Attends Banker Meetings: Never    Marital Status:  Married  Catering manager Violence: Not At Risk (05/21/2022)   Humiliation, Afraid, Rape, and Kick questionnaire    Fear of Current or Ex-Partner: No    Emotionally Abused: No    Physically Abused: No    Sexually Abused: No     Review of Systems  All other systems reviewed and are negative.      Objective:   Physical Exam Vitals reviewed.  Constitutional:      General: She is not in acute distress.    Appearance: Normal appearance. She is not ill-appearing or toxic-appearing.  Eyes:     Extraocular Movements: Extraocular movements intact.     Conjunctiva/sclera: Conjunctivae normal.     Pupils: Pupils are equal, round, and reactive to light.  Cardiovascular:     Rate and Rhythm: Normal rate and regular rhythm.     Pulses: Normal pulses.     Heart sounds: Normal heart sounds. No murmur heard.    No friction rub. No gallop.  Pulmonary:     Effort: Pulmonary effort is normal. No respiratory distress.     Breath sounds: No stridor. No wheezing, rhonchi or rales.  Abdominal:     General: Abdomen is flat. Bowel sounds are normal. There is no distension.     Palpations: Abdomen is soft.     Tenderness: There is no abdominal tenderness. There is no guarding.  Musculoskeletal:     Cervical back: Neck supple.     Right lower leg: Edema present.     Left lower leg: Edema present.  Skin:    General: Skin is warm.     Coloration: Skin is not jaundiced.     Findings: No bruising, erythema, lesion or rash.  Neurological:     General: No focal deficit present.     Mental Status: She is alert and oriented to person, place, and time.     Cranial Nerves: No cranial nerve deficit.     Sensory: No sensory deficit.     Motor: No weakness.     Coordination: Coordination normal.  Gait: Gait normal.     Deep Tendon Reflexes: Reflexes normal.  Psychiatric:        Mood and Affect: Mood normal.        Thought Content: Thought content normal.           Assessment & Plan:    Controlled type 2 diabetes mellitus with complication, without long-term current use of insulin (HCC) - Plan: Lipid panel, Hemoglobin A1c, COMPLETE METABOLIC PANEL WITH GFR  Essential hypertension, benign  CAD in native artery  Heart failure with preserved ejection fraction, unspecified HF chronicity (HCC) She is having a hard time affording Jardiance as it is now over $100 so I gave her every sample of Jardiance that I had.  This will provide her with least 3 to 4 months of therapy.  She has a history of atrial fibrillation although today she is in normal sinus rhythm.  She is appropriately anticoagulated on Eliquis.  Her blood pressure is excellent.  I will check a CMP, lipid panel, and A1c.  Given her history of heart disease, I like to cholesterol less than 55.  I like to see her A1c less than 6.5

## 2022-11-02 LAB — COMPLETE METABOLIC PANEL WITH GFR
AG Ratio: 1.8 (calc) (ref 1.0–2.5)
ALT: 9 U/L (ref 6–29)
AST: 13 U/L (ref 10–35)
Albumin: 4.4 g/dL (ref 3.6–5.1)
Alkaline phosphatase (APISO): 86 U/L (ref 37–153)
BUN/Creatinine Ratio: 20 (calc) (ref 6–22)
BUN: 22 mg/dL (ref 7–25)
CO2: 29 mmol/L (ref 20–32)
Calcium: 10.3 mg/dL (ref 8.6–10.4)
Chloride: 94 mmol/L — ABNORMAL LOW (ref 98–110)
Creat: 1.12 mg/dL — ABNORMAL HIGH (ref 0.60–1.00)
Globulin: 2.5 g/dL (calc) (ref 1.9–3.7)
Glucose, Bld: 120 mg/dL — ABNORMAL HIGH (ref 65–99)
Potassium: 4.6 mmol/L (ref 3.5–5.3)
Sodium: 132 mmol/L — ABNORMAL LOW (ref 135–146)
Total Bilirubin: 0.4 mg/dL (ref 0.2–1.2)
Total Protein: 6.9 g/dL (ref 6.1–8.1)
eGFR: 50 mL/min/{1.73_m2} — ABNORMAL LOW (ref >=60)

## 2022-11-02 LAB — LIPID PANEL
Cholesterol: 124 mg/dL (ref <200)
HDL: 37 mg/dL — ABNORMAL LOW (ref >=50)
LDL Cholesterol (Calc): 60 mg/dL (calc)
Non-HDL Cholesterol (Calc): 87 mg/dL (calc) (ref <130)
Total CHOL/HDL Ratio: 3.4 (calc) (ref <5.0)
Triglycerides: 203 mg/dL — ABNORMAL HIGH (ref <150)

## 2022-11-02 LAB — HEMOGLOBIN A1C
Hgb A1c MFr Bld: 6.8 % of total Hgb — ABNORMAL HIGH (ref <5.7)
Mean Plasma Glucose: 148 mg/dL
eAG (mmol/L): 8.2 mmol/L

## 2022-11-07 DIAGNOSIS — M546 Pain in thoracic spine: Secondary | ICD-10-CM | POA: Diagnosis not present

## 2022-11-07 DIAGNOSIS — M791 Myalgia, unspecified site: Secondary | ICD-10-CM | POA: Diagnosis not present

## 2022-11-11 ENCOUNTER — Other Ambulatory Visit: Payer: Self-pay | Admitting: Family Medicine

## 2022-11-12 NOTE — Telephone Encounter (Signed)
Unable to refill per protocol, Rx expired. Discontinued 10/30/21.  Requested Prescriptions  Pending Prescriptions Disp Refills   hydrochlorothiazide (HYDRODIURIL) 25 MG tablet [Pharmacy Med Name: HYDROCHLOROTHIAZIDE 25MG  TABLETS] 90 tablet 3    Sig: TAKE 1 TABLET BY MOUTH EVERY MORNING FOR FLUID RETENTION     Cardiovascular: Diuretics - Thiazide Failed - 11/11/2022  2:47 PM      Failed - Cr in normal range and within 180 days    Creat  Date Value Ref Range Status  11/01/2022 1.12 (H) 0.60 - 1.00 mg/dL Final   Creatinine, Urine  Date Value Ref Range Status  06/11/2022 71 20 - 275 mg/dL Final         Failed - Na in normal range and within 180 days    Sodium  Date Value Ref Range Status  11/01/2022 132 (L) 135 - 146 mmol/L Final         Failed - Valid encounter within last 6 months    Recent Outpatient Visits           12 months ago Erroneous encounter - disregard   [facility] Donita Brooks, MD   1 year ago Controlled type 2 diabetes mellitus with complication, without long-term current use of insulin (HCC)   [facility] Pickard, Priscille Heidelberg, MD   1 year ago Dysuria   [facility] Pickard, Priscille Heidelberg, MD   1 year ago Dysuria   [facility] Tanya Nones, Priscille Heidelberg, MD   1 year ago Chronic fatigue   [facility] Donita Brooks, MD       Future Appointments             In 2 days Donita Brooks, MD [facility]            Passed - K in normal range and within 180 days    Potassium  Date Value Ref Range Status  11/01/2022 4.6 3.5 - 5.3 mmol/L Final         Passed - Last BP in normal range    BP Readings from Last 1 Encounters:  11/01/22 118/72

## 2022-11-13 ENCOUNTER — Other Ambulatory Visit: Payer: Self-pay | Admitting: Family Medicine

## 2022-11-14 ENCOUNTER — Ambulatory Visit (INDEPENDENT_AMBULATORY_CARE_PROVIDER_SITE_OTHER): Payer: PPO | Admitting: Family Medicine

## 2022-11-14 ENCOUNTER — Encounter: Payer: Self-pay | Admitting: Family Medicine

## 2022-11-14 VITALS — BP 122/56 | HR 54 | Temp 98.5°F | Ht 65.0 in | Wt 142.6 lb

## 2022-11-14 DIAGNOSIS — M2041 Other hammer toe(s) (acquired), right foot: Secondary | ICD-10-CM

## 2022-11-14 DIAGNOSIS — M2042 Other hammer toe(s) (acquired), left foot: Secondary | ICD-10-CM | POA: Diagnosis not present

## 2022-11-14 NOTE — Progress Notes (Signed)
Subjective:    Patient ID: Erika Lee, female    DOB: 30-Dec-1945, 77 y.o.   MRN: [id number]  Patient reports pain in the dorsum of her right foot.  The pain is located at the dorsal aspect of the fourth and fifth MTP joint.  The patient has acquired hammertoes in this area.  The tendon is visible underneath the skin drawing the interphalangeal joint into a chronically flexed position.  The tendon itself is tender to palpation.  There is no erythema.  There is no swelling.  There is no induration.  However passive flexion extension of the MTP joint elicit pain tenderness which has been trying topical Voltaren with no relief Past Medical History:  Diagnosis Date   Anticoagulant long-term use    eliquis   Chronic sinusitis    GERD (gastroesophageal reflux disease)    Heart failure with preserved ejection fraction (HCC)    Hemorrhoids    History of colitis 10/2016   campylobacter   Hyperlipidemia    Hypertension    Multinodular goiter    per pt has had 2 biopsy's both benign   NSTEMI (non-ST elevated myocardial infarction) (HCC)    12/2018- normal coronary arteries on cath, poss coronary vasospasm.    Osteoarthritis    "all over"  and [facility] joint left shoulder   Paroxysmal atrial fibrillation [facility]) EP cardiologist--  dr Johney Frame   s/p PVI by Dr Johney Frame 07/16/2013;   pt first dx 2008,  recurrence 2014 afib/flutter and tachy-brady   Rotator cuff tear, left    Shoulder impingement, left    Status post placement of implantable loop recorder    original placement 07-26-2014;  removal / replacement 10-17-2017  by dr allred   Type 2 diabetes mellitus (HCC)    followed by pcp   Past Surgical History:  Procedure Laterality Date   ATRIAL FIBRILLATION ABLATION N/A 07/16/2013   PVI and CTI ablation by Dr Johney Frame   CARDIAC CATHETERIZATION     01/11/2019- Normal coronary arteries.    COLONOSCOPY  09/10/2011   Dr. Lovell Sheehan: normal, 10 year follow up.   implantable loop recorder removal  04/25/2021    MDT LINQ removed by Dr Johney Frame   LEFT HEART CATH AND CORONARY ANGIOGRAPHY N/A 01/11/2019   Procedure: LEFT HEART CATH AND CORONARY ANGIOGRAPHY;  Surgeon: Runell Gess, MD;  [facility]: [facility];  Service: Cardiovascular;  Laterality: N/A;   LESION REMOVAL N/A 05/03/2013   Procedure: EXCISION CYST, BACK;  Surgeon: Dalia Heading, MD;  Location: [facility];  Service: General;  Laterality: N/A;   LOOP RECORDER IMPLANT N/A 07/26/2014   Procedure: LOOP RECORDER IMPLANT;  Surgeon: Hillis Range, MD;  Location: [facility] CATH LAB;  Service: Cardiovascular;  Laterality: N/A;   LOOP RECORDER INSERTION N/A 10/17/2017   MDT previously implanted ILR for afib management at [facility].  old device removed and new device placed by Dr Johney Frame   LOOP RECORDER REMOVAL N/A 10/17/2017   MDT ILR removed with new device subsequently replaced   SHOULDER ARTHROSCOPY WITH ROTATOR CUFF REPAIR AND SUBACROMIAL DECOMPRESSION Left 01/07/2019   Procedure: Left shoulder subacromial decompression, distal clavicle resection, extensive debridement,;  Surgeon: Yolonda Kida, MD;  Location: [facility];  Service: Orthopedics;  Laterality: Left;   TEE WITHOUT CARDIOVERSION N/A 07/15/2013   Procedure: TRANSESOPHAGEAL ECHOCARDIOGRAM (TEE);  Surgeon: Lewayne Bunting, MD;  Location: [facility] ENDOSCOPY;  Service: Cardiovascular;  Laterality: N/A;   TONSILLECTOMY  age 32   TRANSANAL  HEMORRHOIDAL DEARTERIALIZATION N/A 09/22/2015   Procedure: TRANSANAL HEMORRHOIDAL LIGATION/PEXY EUA POSSIBLE HEMORRHOIDECTOMY ;  Surgeon: Karie Soda, MD;  Location: [facility];  Service: General;  Laterality: N/A;   Current Outpatient Medications on File Prior to Visit  Medication Sig Dispense Refill   benzonatate (TESSALON) 200 MG capsule Take 200 mg by mouth 3 (three) times daily as needed for cough.     bumetanide (BUMEX) 2 MG tablet Take 1 tablet (2 mg total) by mouth daily. Stop furosemide 30 tablet 3   diazepam (VALIUM) 5 MG tablet TAKE 1  TABLET(5 MG) BY MOUTH AT BEDTIME AS NEEDED FOR SLEEP 30 tablet 3   diclofenac Sodium (VOLTAREN) 1 % GEL Apply 2 g topically 4 (four) times daily. 100 g 2   ELIQUIS 5 MG TABS tablet TAKE 1 TABLET(5 MG) BY MOUTH TWICE DAILY 60 tablet 3   esomeprazole (NEXIUM) 20 MG capsule Take 20 mg by mouth daily.      glucose blood (FREESTYLE PRECISION NEO TEST) test strip DX: E11.9 Use to check blood sugar daily 100 strip 11   HYDROcodone-acetaminophen (NORCO) 10-325 MG tablet Take 1 tablet by mouth every 6 (six) hours as needed. 120 tablet 0   JARDIANCE 10 MG TABS tablet TAKE 1 TABLET(10 MG) BY MOUTH DAILY 30 tablet 3   Lancets (FREESTYLE) lancets Use as instructed 100 each 5   lisinopril (ZESTRIL) 40 MG tablet TAKE 1 TABLET(40 MG) BY MOUTH DAILY 90 tablet 3   lisinopril (ZESTRIL) 40 MG tablet TAKE 1 TABLET(40 MG) BY MOUTH DAILY 90 tablet 3   ondansetron (ZOFRAN) 4 MG tablet Take 1 tablet (4 mg total) by mouth every 8 (eight) hours as needed for nausea or vomiting. 20 tablet 0   promethazine (PHENERGAN) 12.5 MG tablet TAKE 1 TABLET(12.5 MG) BY MOUTH EVERY 6 HOURS AS NEEDED FOR NAUSEA OR VOMITING 30 tablet 3   simvastatin (ZOCOR) 40 MG tablet TAKE 1 TABLET(40 MG) BY MOUTH AT BEDTIME 90 tablet 1   spironolactone (ALDACTONE) 25 MG tablet Take 0.5 tablets (12.5 mg total) by mouth daily. 90 tablet 3   triamcinolone cream (KENALOG) 0.1 % Apply 1 Application topically 2 (two) times daily. 30 g 0   No current facility-administered medications on file prior to visit.     Allergies  Allergen Reactions   Clindamycin/Lincomycin Rash   Doxycycline Other (See Comments)    Makes heart race   Keflex [Cephalexin] Nausea And Vomiting   Other    Sulfonic Acid (3,5-Dibromo-4-H-Ox-Benz)    Adhesive [Tape] Rash   Latex Rash   Sulfonamide Derivatives Nausea And Vomiting   Social History   Socioeconomic History   Marital status: Married    Spouse name: Interior and spatial designer   Number of children: 2   Years of education: 12    Highest education level: 12th grade  Occupational History   Occupation: Advertising copywriter: RETIRED    Comment: Full time  Tobacco Use   Smoking status: Former    Years: 34    Types: Cigarettes    Quit date: 01/02/1994    Years since quitting: 28.8    Passive exposure: Past   Smokeless tobacco: Never  Vaping Use   Vaping Use: Never used  Substance and Sexual Activity   Alcohol use: No   Drug use: No   Sexual activity: Not Currently    Partners: Male  Other Topics Concern   Not on file  Social History Narrative   Lives with spouse in [place]  One child deceased from homicide at age 66    Social Determinants of Health   Financial Resource Strain: Low Risk  (05/21/2022)   Overall Financial Resource Strain (CARDIA)    Difficulty of Paying Living Expenses: Not hard at all  Food Insecurity: No Food Insecurity (05/21/2022)   Hunger Vital Sign    Worried About Running Out of Food in the Last Year: Never true    Ran Out of Food in the Last Year: Never true  Transportation Needs: No Transportation Needs (05/21/2022)   PRAPARE - Administrator, Civil Service (Medical): No    Lack of Transportation (Non-Medical): No  Physical Activity: Inactive (05/21/2022)   Exercise Vital Sign    Days of Exercise per Week: 0 days    Minutes of Exercise per Session: 0 min  Stress: Stress Concern Present (05/21/2022)   [employer] of Occupational Health - Occupational Stress Questionnaire    Feeling of Stress : To some extent  Social Connections: Moderately Integrated (05/21/2022)   Social Connection and Isolation Panel [NHANES]    Frequency of Communication with Friends and Family: More than three times a week    Frequency of Social Gatherings with Friends and Family: Once a week    Attends Religious Services: 1 to 4 times per year    Active Member of [employer] or Organizations: No    Attends Banker Meetings: Never    Marital Status: Married   Catering manager Violence: Not At Risk (05/21/2022)   Humiliation, Afraid, Rape, and Kick questionnaire    Fear of Current or Ex-Partner: No    Emotionally Abused: No    Physically Abused: No    Sexually Abused: No     Review of Systems  All other systems reviewed and are negative.      Objective:   Physical Exam Vitals reviewed.  Constitutional:      General: She is not in acute distress.    Appearance: Normal appearance. She is not ill-appearing or toxic-appearing.  Cardiovascular:     Rate and Rhythm: Normal rate and regular rhythm.     Pulses: Normal pulses.          Dorsalis pedis pulses are 2+ on the right side.       Posterior tibial pulses are 2+ on the right side.     Heart sounds: Normal heart sounds. No murmur heard.    No friction rub. No gallop.  Pulmonary:     Effort: Pulmonary effort is normal. No respiratory distress.     Breath sounds: No stridor. No wheezing, rhonchi or rales.  Musculoskeletal:     Cervical back: Neck supple.     Right foot: Decreased range of motion. Prominent metatarsal heads present.       Feet:  Feet:     Right foot:     Skin integrity: No skin breakdown, erythema, warmth or callus.     Toenail Condition: Right toenails are abnormally thick and long. Fungal disease present.    Left foot:     Toenail Condition: Left toenails are abnormally thick and long. Fungal disease present. Neurological:     Mental Status: She is alert.           Assessment & Plan:   Hammertoes of both feet - Plan: Ambulatory referral to Podiatry Patient has acquired hammertoes of the fourth and fifth digits on the right foot.  These are causing her significant pain.  She has tried topical NSAIDs with no  relief.  I recommended referral to podiatry to discuss possible surgery.

## 2022-11-18 ENCOUNTER — Telehealth: Payer: Self-pay

## 2022-11-18 ENCOUNTER — Other Ambulatory Visit: Payer: Self-pay | Admitting: Family Medicine

## 2022-11-18 MED ORDER — HYDROCODONE-ACETAMINOPHEN 10-325 MG PO TABS: 1.0000 | ORAL_TABLET | Freq: Four times a day (QID) | ORAL | 0 refills | Status: DC | PRN

## 2022-11-18 NOTE — Telephone Encounter (Signed)
Pt called in to request a refill  Prescription Request  11/18/2022  LOV: 11/14/22  What is the name of the medication or equipment? HYDROcodone-acetaminophen (NORCO) 10-325 MG tablet [[id number]]  Have you contacted your pharmacy to request a refill? No   Which pharmacy would you like this sent to?  [id number] - [place], [place] - [address], [place] [address] Phone: [phone]  Fax: [phone] DEA #: [id number]     Patient notified that their request is being sent to the clinical staff for review and that they should receive a response within 2 business days.   Please advise at [facility] [phone]

## 2022-11-28 DIAGNOSIS — M1712 Unilateral primary osteoarthritis, left knee: Secondary | ICD-10-CM | POA: Diagnosis not present

## 2022-11-28 DIAGNOSIS — M1711 Unilateral primary osteoarthritis, right knee: Secondary | ICD-10-CM | POA: Diagnosis not present

## 2022-12-03 ENCOUNTER — Encounter: Payer: Self-pay | Admitting: Podiatry

## 2022-12-03 ENCOUNTER — Ambulatory Visit (INDEPENDENT_AMBULATORY_CARE_PROVIDER_SITE_OTHER): Payer: PPO | Admitting: Podiatry

## 2022-12-03 ENCOUNTER — Ambulatory Visit: Payer: PPO

## 2022-12-03 DIAGNOSIS — D2372 Other benign neoplasm of skin of left lower limb, including hip: Secondary | ICD-10-CM

## 2022-12-03 DIAGNOSIS — M2041 Other hammer toe(s) (acquired), right foot: Secondary | ICD-10-CM | POA: Diagnosis not present

## 2022-12-03 DIAGNOSIS — M2042 Other hammer toe(s) (acquired), left foot: Secondary | ICD-10-CM | POA: Diagnosis not present

## 2022-12-04 NOTE — Progress Notes (Signed)
She presents today chief complaint of hammertoes fourth and fifth and the painful lesion on the lateral aspect of the fourth toe would like to consider having the same procedure done that she had to the second toe which was a flexor tenotomy.  Objective: Vital signs stable alert oriented x 3.  Pulses are palpable.  There is no erythema edema salines drainage odor she does have moderate to severe hammertoe deformities and hallux valgus deformities of that right foot.  Hammertoe deformities are flexible and due to the juxtaposition of the fourth and fifth there is a reactive hyperkeratotic lesion lateral aspect of the PIPJ fourth digit right foot.  Assessment: Flexible hammertoe deformities #3 #4 #5 the right foot.  Plan: Discussed etiology pathology conservative surgical therapies at this point need to consider tenotomy's to these toes.  I did debride the reactive hyperkeratotic tissue today we will follow-up with her in the near future for surgical procedures.

## 2022-12-10 ENCOUNTER — Telehealth: Payer: Self-pay | Admitting: Family Medicine

## 2022-12-10 NOTE — Telephone Encounter (Signed)
Patient came to office to request samples of the following medications:  - ELIQUIS 5MG  BID -JARDIANCE 10MG  ONCE DAILY  As per provider's nurse, 1 month's supply of each dispensed to the patient.  Nothing further needed at this time.

## 2022-12-12 ENCOUNTER — Ambulatory Visit: Payer: PPO | Admitting: Podiatry

## 2022-12-13 DIAGNOSIS — M9905 Segmental and somatic dysfunction of pelvic region: Secondary | ICD-10-CM | POA: Diagnosis not present

## 2022-12-13 DIAGNOSIS — M9903 Segmental and somatic dysfunction of lumbar region: Secondary | ICD-10-CM | POA: Diagnosis not present

## 2022-12-13 DIAGNOSIS — M62830 Muscle spasm of back: Secondary | ICD-10-CM | POA: Diagnosis not present

## 2022-12-13 DIAGNOSIS — M9902 Segmental and somatic dysfunction of thoracic region: Secondary | ICD-10-CM | POA: Diagnosis not present

## 2022-12-13 DIAGNOSIS — M546 Pain in thoracic spine: Secondary | ICD-10-CM | POA: Diagnosis not present

## 2022-12-27 ENCOUNTER — Other Ambulatory Visit: Payer: Self-pay

## 2022-12-27 DIAGNOSIS — E118 Type 2 diabetes mellitus with unspecified complications: Secondary | ICD-10-CM

## 2022-12-27 MED ORDER — FREESTYLE LANCETS MISC
5 refills | Status: DC
Start: 2022-12-27 — End: 2022-12-31

## 2022-12-27 MED ORDER — FREESTYLE PRECISION NEO TEST VI STRP
ORAL_STRIP | 5 refills | Status: DC
Start: 2022-12-27 — End: 2023-09-23

## 2022-12-31 ENCOUNTER — Telehealth: Payer: Self-pay | Admitting: Family Medicine

## 2022-12-31 DIAGNOSIS — E118 Type 2 diabetes mellitus with unspecified complications: Secondary | ICD-10-CM

## 2022-12-31 MED ORDER — FREESTYLE LANCETS MISC
5 refills | Status: AC
Start: 2022-12-31 — End: ?

## 2022-12-31 NOTE — Addendum Note (Signed)
Addended by: Arta Silence on: 12/31/2022 12:57 PM   Modules accepted: Orders

## 2022-12-31 NOTE — Telephone Encounter (Signed)
Received Efax from [facility] regarding refill request for Lancets (FREESTYLE) lancets   Plan requires specific directions to process script. Requesting new script with frequency of how patient will use medication and days (supply limitations).  Pharmacy:   [id number] - [place], [place] - [address],  [place] [address] Phone: [phone]  Fax: [phone] DEA #: [id number]    Please advise pharmacist.

## 2023-01-06 ENCOUNTER — Telehealth: Payer: Self-pay

## 2023-01-06 NOTE — Telephone Encounter (Signed)
Pt asks for a refill of diazepam (VALIUM) 5 MG tablet [[id number]].   LOV: 11/14/22  PHARMACY: [id number] - [place], [place] - [address] AT [address], [place] [address] Phone: [phone]  Fax: [phone]   CB#: [phone]

## 2023-01-07 ENCOUNTER — Other Ambulatory Visit: Payer: Self-pay | Admitting: Family Medicine

## 2023-01-07 ENCOUNTER — Telehealth: Payer: Self-pay

## 2023-01-07 MED ORDER — DIAZEPAM 5 MG PO TABS: ORAL_TABLET | ORAL | 3 refills | Status: DC

## 2023-01-07 NOTE — Telephone Encounter (Signed)
Pt brought ppw to be completed by pcp to get assistance with getting this med ELIQUIS 5 MG TABS tablet [[id number]]. Pt would like for form to be faxed to personal fax number provided on intake form. Form and intake form placed in nurse's folder. Please advise.  Cb#: [phone]/

## 2023-01-09 ENCOUNTER — Telehealth: Payer: Self-pay | Admitting: Family Medicine

## 2023-01-09 ENCOUNTER — Other Ambulatory Visit: Payer: Self-pay | Admitting: Family Medicine

## 2023-01-09 MED ORDER — HYDROCODONE-ACETAMINOPHEN 10-325 MG PO TABS: 1.0000 | ORAL_TABLET | Freq: Four times a day (QID) | ORAL | 0 refills | Status: DC | PRN

## 2023-01-09 NOTE — Telephone Encounter (Signed)
Form successfully faxed to [employer] at [phone] with a confirmation time stamp of 01-09-2023 07:42.  Also faxed unsuccessfully to patient's personal fax number of [phone] at 7:52am. Patient aware. Fax resent to patient's personal fax number. Patient will advise if fax is received.

## 2023-01-09 NOTE — Telephone Encounter (Signed)
Prescription Request  01/09/2023  LOV: 11/14/2022  What is the name of the medication or equipment?   HYDROcodone-acetaminophen (NORCO) 10-325 MG tablet   Have you contacted your pharmacy to request a refill? Yes   Which pharmacy would you like this sent to?   [id number] - [place], [place] - [address] AT [address], [place] [address] Phone: [phone]  Fax: [phone] DEA #: [id number]    Patient notified that their request is being sent to the clinical staff for review and that they should receive a response within 2 business days.   Please advise patient when refill sent at [phone].

## 2023-01-14 NOTE — Telephone Encounter (Signed)
Patient called to notify provider section IV of page 4 was incomplete. Erika Lee needs to know whether to ship medication to the office or directly to the patient.   Outbound call placed to notify them to ship it directly to the patient; spoke with Dhera to relay this information.  Outbound call also placed to patient to notify her, and let her know Dhera said it takes 5-7 business days to process the application. After that time period, they will notify the patient. No answer; left this detailed info on patient's voicemail.   Nothing further needed at this time.

## 2023-01-15 ENCOUNTER — Other Ambulatory Visit: Payer: Self-pay

## 2023-01-15 ENCOUNTER — Telehealth: Payer: Self-pay

## 2023-01-15 DIAGNOSIS — I495 Sick sinus syndrome: Secondary | ICD-10-CM

## 2023-01-15 DIAGNOSIS — I480 Paroxysmal atrial fibrillation: Secondary | ICD-10-CM

## 2023-01-15 DIAGNOSIS — I5030 Unspecified diastolic (congestive) heart failure: Secondary | ICD-10-CM

## 2023-01-15 NOTE — Telephone Encounter (Signed)
[employer] has denied pt's assistance for Eliquis. I have sent a referral to Vanice Sarah, Pharm D to see if assistance for patient can be provided. Thanks.

## 2023-01-16 ENCOUNTER — Telehealth: Payer: Self-pay

## 2023-01-16 NOTE — Progress Notes (Signed)
   Care Guide Note  01/16/2023 Name: Erika Lee MRN: [id number] DOB: 1946/02/16  Referred by: Donita Brooks, MD Reason for referral : Care Coordination (Outreach to schedule with Pharm d )   Erika Lee is a 77 y.o. year old female who is a primary care patient of Donita Brooks, MD. Mylo Red was referred to the pharmacist for assistance related to HTN and DM.    Successful contact was made with the patient to discuss pharmacy services including being ready for the pharmacist to call at least 5 minutes before the scheduled appointment time, to have medication bottles and any blood sugar or blood pressure readings ready for review. The patient agreed to meet with the pharmacist via with the pharmacist via telephone visit on (date/time).  02/05/2023  Penne Lash, RMA Care Guide [facility]  [place], [place] [address] Direct Dial: [phone] [email or web address]

## 2023-01-17 ENCOUNTER — Telehealth: Payer: Self-pay

## 2023-01-17 NOTE — Telephone Encounter (Signed)
Pt came in to have patient assistance forms completed by pcp and then faxed to number provided on forms. Pt would like a cb once forms have been completed and faxed please. Pt's forms and intake form placed in nurse's folder.   Cb#: [phone]

## 2023-01-29 ENCOUNTER — Telehealth: Payer: Self-pay | Admitting: Family Medicine

## 2023-01-29 NOTE — Telephone Encounter (Signed)
Patient called to follow up on application sent  to [employer] for [employer]; provider's NPI # missing on paperwork.  Patient stated they're requesting for provider to call or fax them a script with his NPI number on it. Patient requesting for them to send script to her [address].   Their contact number is [phone].  Please fax to [phone]. When received, they will proceed with approval.    Patient also still waiting for [facility] approval from [employer]. Requesting call back with status update.   Please advise at [phone].

## 2023-01-29 NOTE — Telephone Encounter (Signed)
Patient called back to notify provider she spoke with the pharmacy regarding the [facility] script; pharmacist told her they have to receive $361.50 worth of refills before she'll qualify for free refills.   Patient needs Eliquis samples.   Two boxes dispensed.   Please see previous message in thread.

## 2023-02-05 ENCOUNTER — Other Ambulatory Visit: Payer: PPO | Admitting: Pharmacist

## 2023-02-10 ENCOUNTER — Other Ambulatory Visit: Payer: Self-pay | Admitting: Family Medicine

## 2023-02-11 ENCOUNTER — Other Ambulatory Visit: Payer: Self-pay

## 2023-02-11 ENCOUNTER — Telehealth: Payer: Self-pay | Admitting: Family Medicine

## 2023-02-11 ENCOUNTER — Other Ambulatory Visit: Payer: Self-pay | Admitting: Family Medicine

## 2023-02-11 MED ORDER — HYDROCODONE-ACETAMINOPHEN 10-325 MG PO TABS: 1.0000 | ORAL_TABLET | Freq: Four times a day (QID) | ORAL | 0 refills | Status: DC | PRN

## 2023-02-11 NOTE — Telephone Encounter (Signed)
Prescription Request  02/11/2023  LOV: 11/14/2022  What is the name of the medication or equipment? HYDROcodone-acetaminophen (NORCO) 10-325 MG tablet   Have you contacted your pharmacy to request a refill? Yes   Which pharmacy would you like this sent to? [facility] on [address] [place]  Patient notified that their request is being sent to the clinical staff for review and that they should receive a response within 2 business days.   Please advise at [facility] [phone]

## 2023-02-11 NOTE — Telephone Encounter (Signed)
Requested Prescriptions  Pending Prescriptions Disp Refills   simvastatin (ZOCOR) 40 MG tablet [Pharmacy Med Name: SIMVASTATIN 40MG  TABLETS] 90 tablet 2    Sig: TAKE 1 TABLET(40 MG) BY MOUTH AT BEDTIME     Cardiovascular:  Antilipid - Statins Failed - 02/10/2023 10:36 AM      Failed - Valid encounter within last 12 months    Recent Outpatient Visits           1 year ago Erroneous encounter - disregard   [facility] Donita Brooks, MD   1 year ago Controlled type 2 diabetes mellitus with complication, without long-term current use of insulin (HCC)   [facility] Pickard, Priscille Heidelberg, MD   1 year ago Dysuria   [facility] Pickard, Priscille Heidelberg, MD   1 year ago Dysuria   [facility] Tanya Nones, Priscille Heidelberg, MD   1 year ago Chronic fatigue   [facility] Donita Brooks, MD              Failed - Lipid Panel in normal range within the last 12 months    Cholesterol  Date Value Ref Range Status  11/01/2022 124 <200 mg/dL Final   LDL Cholesterol (Calc)  Date Value Ref Range Status  11/01/2022 60 mg/dL (calc) Final    Comment:    Reference range: <100 . Desirable range <100 mg/dL for primary prevention;   <70 mg/dL for patients with CHD or diabetic patients  with > or = 2 CHD risk factors. Marland Kitchen LDL-C is now calculated using the Martin-Hopkins  calculation, which is a validated novel method providing  better accuracy than the Friedewald equation in the  estimation of LDL-C.  Horald Pollen et al. Lenox Ahr. 3235;573(22): 2061-2068  (http://education.QuestDiagnostics.com/faq/FAQ164)    HDL  Date Value Ref Range Status  11/01/2022 37 (L) > OR = 50 mg/dL Final   Triglycerides  Date Value Ref Range Status  11/01/2022 203 (H) <150 mg/dL Final    Comment:    . If a non-fasting specimen was collected, consider repeat triglyceride testing on a fasting specimen if clinically indicated.  Perry Mount et al. J. of Clin.  Lipidol. 2015;9:129-169. Marland Kitchen          Passed - Patient is not pregnant

## 2023-02-25 ENCOUNTER — Other Ambulatory Visit: Payer: Self-pay | Admitting: Family Medicine

## 2023-02-25 ENCOUNTER — Telehealth: Payer: Self-pay | Admitting: Family Medicine

## 2023-02-25 DIAGNOSIS — K838 Other specified diseases of biliary tract: Secondary | ICD-10-CM

## 2023-02-25 DIAGNOSIS — R112 Nausea with vomiting, unspecified: Secondary | ICD-10-CM

## 2023-02-25 MED ORDER — PROMETHAZINE HCL 12.5 MG PO TABS
ORAL_TABLET | ORAL | 3 refills | Status: DC
Start: 2023-02-25 — End: 2023-07-08

## 2023-02-25 NOTE — Telephone Encounter (Signed)
Prescription Request  02/25/2023  LOV: 11/14/2022  What is the name of the medication or equipment?   promethazine (PHENERGAN) 12.5 MG tablet [[id number]]   Have you contacted your pharmacy to request a refill? Yes   Which pharmacy would you like this sent to?    [id number] - [place], [place] - [address] [place] [address] Phone: [phone] Fax: [phone]  Patient notified that their request is being sent to the clinical staff for review and that they should receive a response within 2 business days.   Please advise patient at [phone].

## 2023-02-27 ENCOUNTER — Other Ambulatory Visit: Payer: Self-pay | Admitting: Family Medicine

## 2023-02-27 DIAGNOSIS — E119 Type 2 diabetes mellitus without complications: Secondary | ICD-10-CM | POA: Diagnosis not present

## 2023-02-28 NOTE — Telephone Encounter (Signed)
Requested Prescriptions  Pending Prescriptions Disp Refills   ELIQUIS 5 MG TABS tablet [Pharmacy Med Name: ELIQUIS 5MG  TABLETS] 60 tablet 3    Sig: TAKE 1 TABLET(5 MG) BY MOUTH TWICE DAILY     Hematology:  Anticoagulants - apixaban Failed - 02/27/2023  3:46 PM      Failed - Cr in normal range and within 360 days    Creat  Date Value Ref Range Status  11/01/2022 1.12 (H) 0.60 - 1.00 mg/dL Final   Creatinine, Urine  Date Value Ref Range Status  06/11/2022 71 20 - 275 mg/dL Final         Failed - Valid encounter within last 12 months    Recent Outpatient Visits           1 year ago Erroneous encounter - disregard   [facility] Donita Brooks, MD   1 year ago Controlled type 2 diabetes mellitus with complication, without long-term current use of insulin (HCC)   [facility] Pickard, Priscille Heidelberg, MD   1 year ago Dysuria   [facility] Donita Brooks, MD   1 year ago Dysuria   [facility] Donita Brooks, MD   1 year ago Chronic fatigue   [facility] Pickard, Priscille Heidelberg, MD              Passed - PLT in normal range and within 360 days    Platelets  Date Value Ref Range Status  06/11/2022 199 140 - 400 Thousand/uL Final         Passed - HGB in normal range and within 360 days    Hemoglobin  Date Value Ref Range Status  06/11/2022 13.5 11.7 - 15.5 g/dL Final         Passed - HCT in normal range and within 360 days    HCT  Date Value Ref Range Status  06/11/2022 39.9 35.0 - 45.0 % Final         Passed - AST in normal range and within 360 days    AST  Date Value Ref Range Status  11/01/2022 13 10 - 35 U/L Final         Passed - ALT in normal range and within 360 days    ALT  Date Value Ref Range Status  11/01/2022 9 6 - 29 U/L Final

## 2023-03-12 ENCOUNTER — Other Ambulatory Visit: Payer: PPO | Admitting: Pharmacist

## 2023-03-14 DIAGNOSIS — M9903 Segmental and somatic dysfunction of lumbar region: Secondary | ICD-10-CM | POA: Diagnosis not present

## 2023-03-14 DIAGNOSIS — M9905 Segmental and somatic dysfunction of pelvic region: Secondary | ICD-10-CM | POA: Diagnosis not present

## 2023-03-14 DIAGNOSIS — M62830 Muscle spasm of back: Secondary | ICD-10-CM | POA: Diagnosis not present

## 2023-03-14 DIAGNOSIS — M546 Pain in thoracic spine: Secondary | ICD-10-CM | POA: Diagnosis not present

## 2023-03-14 DIAGNOSIS — M9902 Segmental and somatic dysfunction of thoracic region: Secondary | ICD-10-CM | POA: Diagnosis not present

## 2023-03-19 ENCOUNTER — Other Ambulatory Visit: Payer: PPO | Admitting: Pharmacist

## 2023-03-24 ENCOUNTER — Telehealth: Payer: Self-pay | Admitting: Family Medicine

## 2023-03-24 ENCOUNTER — Other Ambulatory Visit: Payer: Self-pay | Admitting: Family Medicine

## 2023-03-24 MED ORDER — HYDROCODONE-ACETAMINOPHEN 10-325 MG PO TABS: 1.0000 | ORAL_TABLET | Freq: Four times a day (QID) | ORAL | 0 refills | Status: DC | PRN

## 2023-03-24 NOTE — Telephone Encounter (Signed)
Prescription Request  03/24/2023  LOV: 11/14/2022  What is the name of the medication or equipment?   HYDROcodone-acetaminophen (NORCO) 10-325 MG tablet   Have you contacted your pharmacy to request a refill? Yes   Which pharmacy would you like this sent to?    [id number] - [place], [place] - [address] [place] [address] Phone: [phone] Fax: [phone]   Patient notified that their request is being sent to the clinical staff for review and that they should receive a response within 2 business days.   Please advise patient.

## 2023-03-31 DIAGNOSIS — H02831 Dermatochalasis of right upper eyelid: Secondary | ICD-10-CM | POA: Diagnosis not present

## 2023-03-31 DIAGNOSIS — H02834 Dermatochalasis of left upper eyelid: Secondary | ICD-10-CM | POA: Diagnosis not present

## 2023-03-31 DIAGNOSIS — H2513 Age-related nuclear cataract, bilateral: Secondary | ICD-10-CM | POA: Diagnosis not present

## 2023-04-25 ENCOUNTER — Telehealth: Payer: Self-pay

## 2023-04-25 ENCOUNTER — Other Ambulatory Visit: Payer: Self-pay | Admitting: Family Medicine

## 2023-04-25 MED ORDER — HYDROCODONE-ACETAMINOPHEN 10-325 MG PO TABS: 1.0000 | ORAL_TABLET | Freq: Four times a day (QID) | ORAL | 0 refills | Status: DC | PRN

## 2023-04-25 NOTE — Telephone Encounter (Signed)
Pt called in to request a refill of these meds:  HYDROcodone-acetaminophen (NORCO) 10-325 MG tablet [[id number]] promethazine (PHENERGAN) 12.5 MG tablet [[id number]]  LOV: 11/14/22  PHARMACY:  [id number] - [place], [place] - [address] AT [address], [place] [address] Phone: [phone]  Fax: [phone] DEA #: [id number]   CB#: [phone]

## 2023-05-05 ENCOUNTER — Ambulatory Visit (INDEPENDENT_AMBULATORY_CARE_PROVIDER_SITE_OTHER): Payer: PPO | Admitting: Family Medicine

## 2023-05-05 ENCOUNTER — Encounter: Payer: Self-pay | Admitting: Family Medicine

## 2023-05-05 VITALS — BP 130/72 | HR 46 | Temp 98.2°F | Ht 65.0 in | Wt 140.0 lb

## 2023-05-05 DIAGNOSIS — I480 Paroxysmal atrial fibrillation: Secondary | ICD-10-CM | POA: Diagnosis not present

## 2023-05-05 DIAGNOSIS — E118 Type 2 diabetes mellitus with unspecified complications: Secondary | ICD-10-CM

## 2023-05-05 DIAGNOSIS — Z23 Encounter for immunization: Secondary | ICD-10-CM | POA: Diagnosis not present

## 2023-05-05 DIAGNOSIS — Z7984 Long term (current) use of oral hypoglycemic drugs: Secondary | ICD-10-CM

## 2023-05-05 NOTE — Progress Notes (Signed)
Subjective:    Patient ID: Erika Lee, female    DOB: 1946/06/13, 77 y.o.   MRN: [id number] Patient has a history of coronary artery disease, type 2 diabetes, hypertension, hyperlipidemia, and diastolic heart failure.  Physically, she states that she is extremely tired.  She caring for her husband who is on hospice.  He barely talks.  He is confined to bed.  There has been some stress in their immediate family over caring for him.  This is weighing on her.  She also complains of bilateral shoulder pain and bilateral knee pain.  She has chronic low back pain.  Aside from this physically she is doing well.  She denies any chest pain.  She denies any shortness of breath.  She denies any dyspnea on exertion.  She does have some trace bipedal edema in both legs.  She denies any orthopnea or paroxysmal nocturnal dyspnea. Past Medical History:  Diagnosis Date   Anticoagulant long-term use    eliquis   Chronic sinusitis    GERD (gastroesophageal reflux disease)    Heart failure with preserved ejection fraction (HCC)    Hemorrhoids    History of colitis 10/2016   campylobacter   Hyperlipidemia    Hypertension    Multinodular goiter    per pt has had 2 biopsy's both benign   NSTEMI (non-ST elevated myocardial infarction) (HCC)    12/2018- normal coronary arteries on cath, poss coronary vasospasm.    Osteoarthritis    "all over"  and [facility] joint left shoulder   Paroxysmal atrial fibrillation [facility]) EP cardiologist--  dr Johney Frame   s/p PVI by Dr Johney Frame 07/16/2013;   pt first dx 2008,  recurrence 2014 afib/flutter and tachy-brady   Rotator cuff tear, left    Shoulder impingement, left    Status post placement of implantable loop recorder    original placement 07-26-2014;  removal / replacement 10-17-2017  by dr allred   Type 2 diabetes mellitus (HCC)    followed by pcp   Past Surgical History:  Procedure Laterality Date   ATRIAL FIBRILLATION ABLATION N/A 07/16/2013   PVI and CTI ablation by Dr  Johney Frame   CARDIAC CATHETERIZATION     01/11/2019- Normal coronary arteries.    COLONOSCOPY  09/10/2011   Dr. Lovell Sheehan: normal, 10 year follow up.   implantable loop recorder removal  04/25/2021   MDT LINQ removed by Dr Johney Frame   LEFT HEART CATH AND CORONARY ANGIOGRAPHY N/A 01/11/2019   Procedure: LEFT HEART CATH AND CORONARY ANGIOGRAPHY;  Surgeon: Runell Gess, MD;  Location: [facility];  Service: Cardiovascular;  Laterality: N/A;   LESION REMOVAL N/A 05/03/2013   Procedure: EXCISION CYST, BACK;  Surgeon: Dalia Heading, MD;  Location: [facility];  Service: General;  Laterality: N/A;   LOOP RECORDER IMPLANT N/A 07/26/2014   Procedure: LOOP RECORDER IMPLANT;  Surgeon: Hillis Range, MD;  Location: [facility] CATH LAB;  Service: Cardiovascular;  Laterality: N/A;   LOOP RECORDER INSERTION N/A 10/17/2017   MDT previously implanted ILR for afib management at [facility].  old device removed and new device placed by Dr Johney Frame   LOOP RECORDER REMOVAL N/A 10/17/2017   MDT ILR removed with new device subsequently replaced   SHOULDER ARTHROSCOPY WITH ROTATOR CUFF REPAIR AND SUBACROMIAL DECOMPRESSION Left 01/07/2019   Procedure: Left shoulder subacromial decompression, distal clavicle resection, extensive debridement,;  Surgeon: Yolonda Kida, MD;  Location: [facility];  Service: Orthopedics;  Laterality: Left;  TEE WITHOUT CARDIOVERSION N/A 07/15/2013   Procedure: TRANSESOPHAGEAL ECHOCARDIOGRAM (TEE);  Surgeon: Lewayne Bunting, MD;  Location[facility];  Service: Cardiovascular;  Laterality: N/A;   TONSILLECTOMY  age 51   TRANSANAL HEMORRHOIDAL DEARTERIALIZATION N/A 09/22/2015   Procedure: TRANSANAL HEMORRHOIDAL LIGATION/PEXY EUA POSSIBLE HEMORRHOIDECTOMY ;  Surgeon: Karie Soda, MD;  Location: [facility];  Service: General;  Laterality: N/A;   Current Outpatient Medications on File Prior to Visit  Medication Sig Dispense Refill   diazepam (VALIUM) 5 MG tablet TAKE 1 TABLET(5 MG) BY  MOUTH AT BEDTIME AS NEEDED FOR SLEEP 30 tablet 3   ELIQUIS 5 MG TABS tablet TAKE 1 TABLET(5 MG) BY MOUTH TWICE DAILY 60 tablet 3   glucose blood (FREESTYLE PRECISION NEO TEST) test strip DX: E11.9 Use to check blood sugar daily 100 strip 5   HYDROcodone-acetaminophen (NORCO) 10-325 MG tablet Take 1 tablet by mouth every 6 (six) hours as needed. 120 tablet 0   JARDIANCE 10 MG TABS tablet TAKE 1 TABLET(10 MG) BY MOUTH DAILY 30 tablet 3   Lancets (FREESTYLE) lancets Use to check blood sugar daily. 100 each 5   lisinopril (ZESTRIL) 40 MG tablet TAKE 1 TABLET(40 MG) BY MOUTH DAILY 90 tablet 3   ondansetron (ZOFRAN) 4 MG tablet Take 1 tablet (4 mg total) by mouth every 8 (eight) hours as needed for nausea or vomiting. 20 tablet 0   promethazine (PHENERGAN) 12.5 MG tablet TAKE 1 TABLET(12.5 MG) BY MOUTH EVERY 6 HOURS AS NEEDED FOR NAUSEA OR VOMITING 30 tablet 3   simvastatin (ZOCOR) 40 MG tablet TAKE 1 TABLET(40 MG) BY MOUTH AT BEDTIME 90 tablet 2   spironolactone (ALDACTONE) 25 MG tablet Take 0.5 tablets (12.5 mg total) by mouth daily. 90 tablet 3   No current facility-administered medications on file prior to visit.     Allergies  Allergen Reactions   Clindamycin/Lincomycin Rash   Doxycycline Other (See Comments)    Makes heart race   Keflex [Cephalexin] Nausea And Vomiting   Other    Sulfonic Acid (3,5-Dibromo-4-H-Ox-Benz)    Adhesive [Tape] Rash   Latex Rash   Sulfonamide Derivatives Nausea And Vomiting   Social History   Socioeconomic History   Marital status: Married    Spouse name: Interior and spatial designer   Number of children: 2   Years of education: 12   Highest education level: 12th grade  Occupational History   Occupation: Advertising copywriter: RETIRED    Comment: Full time  Tobacco Use   Smoking status: Former    Current packs/day: 0.00    Types: Cigarettes    Start date: 01/03/1960    Quit date: 01/02/1994    Years since quitting: 29.3    Passive exposure: Past    Smokeless tobacco: Never  Vaping Use   Vaping status: Never Used  Substance and Sexual Activity   Alcohol use: No   Drug use: No   Sexual activity: Not Currently    Partners: Male  Other Topics Concern   Not on file  Social History Narrative   Lives with spouse in [place]   One child deceased from homicide at age 33    Social Determinants of Health   Financial Resource Strain: Low Risk  (05/21/2022)   Overall Financial Resource Strain (CARDIA)    Difficulty of Paying Living Expenses: Not hard at all  Food Insecurity: No Food Insecurity (05/21/2022)   Hunger Vital Sign    Worried About Running Out of Food in the  Last Year: Never true    Ran Out of Food in the Last Year: Never true  Transportation Needs: No Transportation Needs (05/21/2022)   PRAPARE - Administrator, Civil Service (Medical): No    Lack of Transportation (Non-Medical): No  Physical Activity: Inactive (05/21/2022)   Exercise Vital Sign    Days of Exercise per Week: 0 days    Minutes of Exercise per Session: 0 min  Stress: Stress Concern Present (05/21/2022)   [employer] of Occupational Health - Occupational Stress Questionnaire    Feeling of Stress : To some extent  Social Connections: Moderately Integrated (05/21/2022)   Social Connection and Isolation Panel [NHANES]    Frequency of Communication with Friends and Family: More than three times a week    Frequency of Social Gatherings with Friends and Family: Once a week    Attends Religious Services: 1 to 4 times per year    Active Member of [employer] or Organizations: No    Attends Banker Meetings: Not on file    Marital Status: Married  Catering manager Violence: Not At Risk (05/21/2022)   Humiliation, Afraid, Rape, and Kick questionnaire    Fear of Current or Ex-Partner: No    Emotionally Abused: No    Physically Abused: No    Sexually Abused: No     Review of Systems  All other systems reviewed and are  negative.      Objective:   Physical Exam Vitals reviewed.  Constitutional:      General: She is not in acute distress.    Appearance: Normal appearance. She is not ill-appearing or toxic-appearing.  Eyes:     Extraocular Movements: Extraocular movements intact.     Conjunctiva/sclera: Conjunctivae normal.     Pupils: Pupils are equal, round, and reactive to light.  Cardiovascular:     Rate and Rhythm: Normal rate and regular rhythm.     Pulses: Normal pulses.     Heart sounds: Normal heart sounds. No murmur heard.    No friction rub. No gallop.  Pulmonary:     Effort: Pulmonary effort is normal. No respiratory distress.     Breath sounds: No stridor. No wheezing, rhonchi or rales.  Abdominal:     General: Abdomen is flat. Bowel sounds are normal. There is no distension.     Palpations: Abdomen is soft.     Tenderness: There is no abdominal tenderness. There is no guarding.  Musculoskeletal:     Cervical back: Neck supple.     Right lower leg: Edema present.     Left lower leg: Edema present.  Skin:    General: Skin is warm.     Coloration: Skin is not jaundiced.     Findings: No bruising, erythema, lesion or rash.  Neurological:     General: No focal deficit present.     Mental Status: She is alert and oriented to person, place, and time.     Cranial Nerves: No cranial nerve deficit.     Sensory: No sensory deficit.     Motor: No weakness.     Coordination: Coordination normal.     Gait: Gait normal.     Deep Tendon Reflexes: Reflexes normal.  Psychiatric:        Mood and Affect: Mood normal.        Thought Content: Thought content normal.           Assessment & Plan:   Controlled type 2 diabetes mellitus with  complication, without long-term current use of insulin (HCC) - Plan: CBC with Differential/Platelet, COMPLETE METABOLIC PANEL WITH GFR, Lipid panel, Hemoglobin A1c, TSH  Paroxysmal atrial fibrillation (HCC) - Plan: CBC with Differential/Platelet,  COMPLETE METABOLIC PANEL WITH GFR, Lipid panel, Hemoglobin A1c, TSH  Needs flu shot - Plan: Flu Vaccine Trivalent High Dose (Fluad) The listed heart rate is incorrect.  Patient's heart rate today is 56 bpm on exam.  She denies any lightheadedness or syncope or near syncope.  Her blood pressure is excellent.  I will check a CBC, CMP, and an A1c.  I would like her A1c to be less than 7.  I will check a fasting lipid panel.  I would like to see her LDL cholesterol less than 70.  Given her fatigue and tiredness I will check lites and a CBC.  However I suspect majority of her symptoms are due to stress

## 2023-05-06 LAB — CBC WITH DIFFERENTIAL/PLATELET
Absolute Lymphocytes: 2135 {cells}/uL (ref 850–3900)
Absolute Monocytes: 586 {cells}/uL (ref 200–950)
Basophils Absolute: 43 {cells}/uL (ref 0–200)
Basophils Relative: 0.7 %
Eosinophils Absolute: 140 {cells}/uL (ref 15–500)
Eosinophils Relative: 2.3 %
HCT: 46.1 % — ABNORMAL HIGH (ref 35.0–45.0)
Hemoglobin: 15 g/dL (ref 11.7–15.5)
MCH: 28.9 pg (ref 27.0–33.0)
MCHC: 32.5 g/dL (ref 32.0–36.0)
MCV: 88.8 fL (ref 80.0–100.0)
MPV: 10.4 fL (ref 7.5–12.5)
Monocytes Relative: 9.6 %
Neutro Abs: 3196 {cells}/uL (ref 1500–7800)
Neutrophils Relative %: 52.4 %
Platelets: 181 10*3/uL (ref 140–400)
RBC: 5.19 10*6/uL — ABNORMAL HIGH (ref 3.80–5.10)
RDW: 12.5 % (ref 11.0–15.0)
Total Lymphocyte: 35 %
WBC: 6.1 10*3/uL (ref 3.8–10.8)

## 2023-05-06 LAB — LIPID PANEL
Cholesterol: 122 mg/dL (ref <200)
HDL: 48 mg/dL — ABNORMAL LOW (ref >=50)
LDL Cholesterol (Calc): 54 mg/dL
Non-HDL Cholesterol (Calc): 74 mg/dL (ref <130)
Total CHOL/HDL Ratio: 2.5 (calc) (ref <5.0)
Triglycerides: 117 mg/dL (ref <150)

## 2023-05-06 LAB — COMPLETE METABOLIC PANEL WITH GFR
AG Ratio: 2 (calc) (ref 1.0–2.5)
ALT: 11 U/L (ref 6–29)
AST: 14 U/L (ref 10–35)
Albumin: 4.6 g/dL (ref 3.6–5.1)
Alkaline phosphatase (APISO): 96 U/L (ref 37–153)
BUN: 14 mg/dL (ref 7–25)
CO2: 29 mmol/L (ref 20–32)
Calcium: 10.4 mg/dL (ref 8.6–10.4)
Chloride: 103 mmol/L (ref 98–110)
Creat: 0.85 mg/dL (ref 0.60–1.00)
Globulin: 2.3 g/dL (ref 1.9–3.7)
Glucose, Bld: 112 mg/dL — ABNORMAL HIGH (ref 65–99)
Potassium: 4.3 mmol/L (ref 3.5–5.3)
Sodium: 141 mmol/L (ref 135–146)
Total Bilirubin: 0.5 mg/dL (ref 0.2–1.2)
Total Protein: 6.9 g/dL (ref 6.1–8.1)
eGFR: 70 mL/min/{1.73_m2} (ref >=60)

## 2023-05-06 LAB — HEMOGLOBIN A1C
Hgb A1c MFr Bld: 6.1 %{Hb} — ABNORMAL HIGH (ref <5.7)
Mean Plasma Glucose: 128 mg/dL
eAG (mmol/L): 7.1 mmol/L

## 2023-05-06 LAB — TSH: TSH: 1.82 m[IU]/L (ref 0.40–4.50)

## 2023-05-08 ENCOUNTER — Encounter (HOSPITAL_COMMUNITY): Payer: PPO

## 2023-05-14 ENCOUNTER — Telehealth: Payer: Self-pay

## 2023-05-14 NOTE — Telephone Encounter (Signed)
Copied from CRM [id number]. Topic: Clinical - Medication Refill >> May 14, 2023  1:05 PM Mosetta Putt H wrote: Reason for CRM: refill diazepam (VALIUM) 5 MG tablet

## 2023-05-15 ENCOUNTER — Other Ambulatory Visit: Payer: Self-pay | Admitting: Family Medicine

## 2023-05-15 MED ORDER — DIAZEPAM 5 MG PO TABS: ORAL_TABLET | ORAL | 3 refills | Status: DC

## 2023-05-16 DIAGNOSIS — M9905 Segmental and somatic dysfunction of pelvic region: Secondary | ICD-10-CM | POA: Diagnosis not present

## 2023-05-16 DIAGNOSIS — M9903 Segmental and somatic dysfunction of lumbar region: Secondary | ICD-10-CM | POA: Diagnosis not present

## 2023-05-16 DIAGNOSIS — M1712 Unilateral primary osteoarthritis, left knee: Secondary | ICD-10-CM | POA: Diagnosis not present

## 2023-05-16 DIAGNOSIS — M9902 Segmental and somatic dysfunction of thoracic region: Secondary | ICD-10-CM | POA: Diagnosis not present

## 2023-05-16 DIAGNOSIS — M62830 Muscle spasm of back: Secondary | ICD-10-CM | POA: Diagnosis not present

## 2023-05-21 ENCOUNTER — Encounter (HOSPITAL_COMMUNITY)
Admission: RE | Admit: 2023-05-21 | Discharge: 2023-05-21 | Disposition: A | Discharge status: Home / Self Care | Payer: PPO | Source: Ambulatory Visit | Attending: Optometry | Admitting: Optometry

## 2023-05-21 NOTE — H&P (Signed)
Surgical History & Physical  Patient Name: Anu Camilleri  DOB: 1945/08/08  Surgery: Cataract extraction with intraocular lens implant phacoemulsification; Left Eye Surgeon: Fabio Pierce MD Surgery Date: 05/26/2023 Pre-Op Date: 03/31/2023  HPI: A 14 Yr. old female patient 1. The patient complains of difficulty when reading fine print, books, newspaper, instructions etc., which began 1 year ago. Both eyes are affected. The episode is constant. The condition's severity is worsening. Symptoms occur when the patient is reading. This is negatively affecting the patient's quality of life and the patient is unable to function adequately in life with the current level of vision. HPI Completed by Dr. Fabio Pierce  Medical History:  Diabetes High Blood Pressure Hypercholesterolemia, Allergies and sinuses.  Review of Systems Allergic/Immunologic Seasonal Allergies Cardiovascular Hypertensive Endocrine Diabetes All recorded systems are negative except as noted above.  Social Never smoked  Medication hydrocodone-acetaminophen ,  promethazine ,  spironolactone ,  simvastatin ,  diazepam ,  lisinopril ,  Eliquis   Sx/Procedures Tonsilectomy  Drug Allergies  doxycycline ,  latex ,  adhesive tape ,  sulfonic acid ,  keflex   History & Physical: Heent: cataracts NECK: supple without bruits LUNGS: lungs clear to auscultation CV: regular rate and rhythm Abdomen: soft and non-tender  Impression & Plan: Assessment: 1.  NUCLEAR SCLEROSIS AGE RELATED; Both Eyes (H25.13) 2.  DERMATOCHALASIS, no surgery; Right Upper Lid, Left Upper Lid (H02.831, H02.834) 3.  BLEPHARITIS; Right Upper Lid, Right Lower Lid, Left Upper Lid, Left Lower Lid (H01.001, H01.002,H01.004,H01.005) 4.  Pinguecula; Both Eyes (H11.153)  Plan: 1.  Cataract accounts for the patient's decreased vision. This visual impairment is not correctable with a tolerable change in glasses or contact lenses. Cataract surgery with an  implantation of a new lens should significantly improve the visual and functional status of the patient. Discussed all risks, benefits, alternatives, and potential complications. Discussed the procedures and recovery. Patient desires to have surgery. A-scan ordered and performed today for intra-ocular lens calculations. The surgery will be performed in order to improve vision for driving, reading, and for eye examinations. Recommend phacoemulsification with intra-ocular lens. Recommend Dextenza for post-operative pain and inflammation. Left Eye non-dominant - first. Dilates poorly - shugarcaine by protocol. Malyugin Ring. Omidira. Consider Vivity IOL.  2.  Asymptomatic, recommend observation for now. Findings, prognosis and treatment options reviewed.  3.  Blepharitis is present - recommend regular lid cleaning.  4.  Observe; Artificial tears as needed for irritation.

## 2023-05-22 NOTE — Pre-Procedure Instructions (Signed)
Attempted pre-op phonecall. Left VM for her to call us back. 

## 2023-05-23 ENCOUNTER — Other Ambulatory Visit: Payer: Self-pay

## 2023-05-23 DIAGNOSIS — E118 Type 2 diabetes mellitus with unspecified complications: Secondary | ICD-10-CM

## 2023-05-23 DIAGNOSIS — M62830 Muscle spasm of back: Secondary | ICD-10-CM | POA: Diagnosis not present

## 2023-05-23 DIAGNOSIS — M9902 Segmental and somatic dysfunction of thoracic region: Secondary | ICD-10-CM | POA: Diagnosis not present

## 2023-05-23 DIAGNOSIS — M9905 Segmental and somatic dysfunction of pelvic region: Secondary | ICD-10-CM | POA: Diagnosis not present

## 2023-05-23 DIAGNOSIS — M9903 Segmental and somatic dysfunction of lumbar region: Secondary | ICD-10-CM | POA: Diagnosis not present

## 2023-05-23 MED ORDER — EMPAGLIFLOZIN 10 MG PO TABS: 10.0000 mg | ORAL_TABLET | Freq: Every day | ORAL | 3 refills | Status: DC

## 2023-05-23 NOTE — Telephone Encounter (Signed)
LVM w/pt to return call re pt's refill-Jardiance

## 2023-05-23 NOTE — Pre-Procedure Instructions (Signed)
Attempted pre-op phone call. Left VM on home and cell numbers asking her to call us back.

## 2023-05-26 ENCOUNTER — Ambulatory Visit (HOSPITAL_COMMUNITY): Admission: RE | Admit: 2023-05-26 | Payer: PPO | Source: Home / Self Care | Admitting: Ophthalmology

## 2023-05-26 ENCOUNTER — Encounter (HOSPITAL_COMMUNITY): Admission: RE | Payer: Self-pay | Source: Home / Self Care

## 2023-05-26 SURGERY — CATARACT EXTRACTION PHACO AND INTRAOCULAR LENS PLACEMENT (IOC) with placement of Corticosteroid
Anesthesia: Monitor Anesthesia Care | Laterality: Left

## 2023-05-27 NOTE — Progress Notes (Addendum)
Cardiology Office Note Date:  05/27/2023  Patient ID:  Erika Lee, Erika Lee 1945/08/03, MRN [id number] PCP:  Donita Brooks, MD  Electrophysiologist:  Dr. Johney Frame >> Dr. Lalla Brothers   Chief Complaint:    f/u on low HRs  History of Present Illness: Erika Lee is a 77 y.o. female with history of DM, GERD, HTN, AFlutter/AFib (s/p CTI and PVI ablation 2015), July 2020 suffered NSTEMI > echo noted preserved LVEF with new WMA > cath with normal coronaries, possibly had spasm.  She saw Dr. 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  Planned for 6 mo visit.  I saw her Oct 2023 She is primary caregiver for her husband who unfortunately has suffered a catastrophic stroke, requiring full care.  She reports that she has always had just a little swelling at her ankles, but about 3 mo or so ago, started to notice more and more. Since the unna boots were removed she says they did look better, have been about the same since then, not better or worse. She has lost a few pounds though. No CP, denies any unusual SOB. She mentioned some SOB when she saw Dr. Johney Frame in July, stopping the Toprol did not change it, but it isnt worse either. Not so SOB that she has to stop or rest. No palpitations or Afib No near syncope or syncope. PMD following her diuretics, planned to update her echo Planned to transition to Dr. Lalla Brothers  LVEF 65-70%, no WMA, RV OK, , no sign VHD  She saw Dr. Lalla Brothers 08/07/22, fluctuation in HR/rhythm, unable to tell if/when she is in AFib, compliant with Eliquis.  Mentioned on Jardiance, off metoprolol.  No changes were made.  Saw Dr. Tanya Nones 05/05/23, caring for her husband was weighing on her, tired. + edema, no SOB/nocturnal symptoms Planned for labs HR was 46 BP 130/72   TODAY She continues to be the caregiver for her  husband now over a year post stroke, requiring total care. She denies CP, palpitations or cardiac awareness No dizzy spells, no near syncope or syncope. She does get SOB with heavier activities, gives an example of bring the trash cans to the road, or carrying groceries , not with usual ADLs or care for her husband. No bleeding or signs of bleeding  Her observation of HRs at home 40's-50's when she checks  AFib/AAD hx Diagnosed 2008 Flecainide failed to maintain SR (2015) PVI ablation 07/16/2013  Device information: MDT ILR implanted 10/17/17, AFib surveillance  RRT Removed 04/25/21   Past Medical History:  Diagnosis Date   Anticoagulant long-term use    eliquis   Chronic sinusitis    GERD (gastroesophageal reflux disease)    Heart failure with preserved ejection fraction (HCC)    Hemorrhoids    History of colitis 10/2016   campylobacter   Hyperlipidemia    Hypertension    Multinodular goiter    per pt has had 2 biopsy's both benign   NSTEMI (non-ST elevated myocardial infarction) (HCC)    12/2018- normal coronary arteries on cath, poss coronary vasospasm.    Osteoarthritis    "all over"  and [facility] joint left shoulder   Paroxysmal atrial fibrillation [facility]) EP cardiologist--  dr Johney Frame   s/p PVI by Dr Johney Frame 07/16/2013;   pt first dx 2008,  recurrence 2014 afib/flutter and  tachy-brady   Rotator cuff tear, left    Shoulder impingement, left    Status post placement of implantable loop recorder    original placement 07-26-2014;  removal / replacement 10-17-2017  by dr allred   Type 2 diabetes mellitus (HCC)    followed by pcp    Past Surgical History:  Procedure Laterality Date   ATRIAL FIBRILLATION ABLATION N/A 07/16/2013   PVI and CTI ablation by Dr Johney Frame   CARDIAC CATHETERIZATION     01/11/2019- Normal coronary arteries.    COLONOSCOPY  09/10/2011   Dr. Lovell Sheehan: normal, 10 year follow up.   implantable loop recorder removal  04/25/2021   MDT LINQ removed by Dr Johney Frame    LEFT HEART CATH AND CORONARY ANGIOGRAPHY N/A 01/11/2019   Procedure: LEFT HEART CATH AND CORONARY ANGIOGRAPHY;  Surgeon: Runell Gess, MD;  Location: [facility] INVASIVE CV LAB;  Service: Cardiovascular;  Laterality: N/A;   LESION REMOVAL N/A 05/03/2013   Procedure: EXCISION CYST, BACK;  Surgeon: Dalia Heading, MD;  Location: [facility] ORS;  Service: General;  Laterality: N/A;   LOOP RECORDER IMPLANT N/A 07/26/2014   Procedure: LOOP RECORDER IMPLANT;  Surgeon: Hillis Range, MD;  Location: [facility] CATH LAB;  Service: Cardiovascular;  Laterality: N/A;   LOOP RECORDER INSERTION N/A 10/17/2017   MDT previously implanted ILR for afib management at RRT.  old device removed and new device placed by Dr Johney Frame   LOOP RECORDER REMOVAL N/A 10/17/2017   MDT ILR removed with new device subsequently replaced   SHOULDER ARTHROSCOPY WITH ROTATOR CUFF REPAIR AND SUBACROMIAL DECOMPRESSION Left 01/07/2019   Procedure: Left shoulder subacromial decompression, distal clavicle resection, extensive debridement,;  Surgeon: Yolonda Kida, MD;  Location: [facility];  Service: Orthopedics;  Laterality: Left;   TEE WITHOUT CARDIOVERSION N/A 07/15/2013   Procedure: TRANSESOPHAGEAL ECHOCARDIOGRAM (TEE);  Surgeon: Lewayne Bunting, MD;  Location: [facility] ENDOSCOPY;  Service: Cardiovascular;  Laterality: N/A;   TONSILLECTOMY  age 5   TRANSANAL HEMORRHOIDAL DEARTERIALIZATION N/A 09/22/2015   Procedure: TRANSANAL HEMORRHOIDAL LIGATION/PEXY EUA POSSIBLE HEMORRHOIDECTOMY ;  Surgeon: Karie Soda, MD;  Location: [facility] ORS;  Service: General;  Laterality: N/A;    Current Outpatient Medications  Medication Sig Dispense Refill   diazepam (VALIUM) 5 MG tablet TAKE 1 TABLET(5 MG) BY MOUTH AT BEDTIME AS NEEDED FOR SLEEP 30 tablet 3   ELIQUIS 5 MG TABS tablet TAKE 1 TABLET(5 MG) BY MOUTH TWICE DAILY 60 tablet 3   empagliflozin (JARDIANCE) 10 MG TABS tablet Take 1 tablet (10 mg total) by mouth daily. TAKE 1 TABLET(10 MG) BY MOUTH  DAILY 30 tablet 3   glucose blood (FREESTYLE PRECISION NEO TEST) test strip DX: E11.9 Use to check blood sugar daily 100 strip 5   HYDROcodone-acetaminophen (NORCO) 10-325 MG tablet Take 1 tablet by mouth every 6 (six) hours as needed. 120 tablet 0   Lancets (FREESTYLE) lancets Use to check blood sugar daily. 100 each 5   lisinopril (ZESTRIL) 40 MG tablet TAKE 1 TABLET(40 MG) BY MOUTH DAILY 90 tablet 3   ondansetron (ZOFRAN) 4 MG tablet Take 1 tablet (4 mg total) by mouth every 8 (eight) hours as needed for nausea or vomiting. 20 tablet 0   promethazine (PHENERGAN) 12.5 MG tablet TAKE 1 TABLET(12.5 MG) BY MOUTH EVERY 6 HOURS AS NEEDED FOR NAUSEA OR VOMITING 30 tablet 3   simvastatin (ZOCOR) 40 MG tablet TAKE 1 TABLET(40 MG) BY MOUTH AT BEDTIME 90 tablet 2   spironolactone (ALDACTONE) 25 MG  tablet Take 0.5 tablets (12.5 mg total) by mouth daily. 90 tablet 3   No current facility-administered medications for this visit.    Allergies:   Clindamycin/lincomycin; Doxycycline; Keflex [cephalexin]; Other; Sulfonic acid (3,5-dibromo-4-h-ox-benz); Adhesive [tape]; Latex; and Sulfonamide derivatives   Social History:  The patient  reports that she quit smoking about 29 years ago. Her smoking use included cigarettes. She started smoking about 63 years ago. She has been exposed to tobacco smoke. She has never used smokeless tobacco. She reports that she does not drink alcohol and does not use drugs.   Family History:  The patient's family history includes Kidney disease in her maternal grandfather; Other in her maternal grandfather; Stroke in her maternal grandmother; Stroke (age of onset: 12) in her mother.  ROS:  Please see the history of present illness.    All other systems are reviewed and otherwise negative.   PHYSICAL EXAM:  VS:  LMP  (LMP Unknown)  BMI: There is no height or weight on file to calculate BMI. Well nourished, well developed, in no acute distress  HEENT: normocephalic, atraumatic   Neck: no JVD, carotid bruits or masses Cardiac:  RRR; no significant murmurs, no rubs, or gallops Lungs:  CTA b/l, no wheezing, rhonchi or rales  Abd: soft, nontender MS: no deformity or atrophy Ext:  she has compression hose, no edema Skin: warm and dry, no rash Neuro:  No gross deficits appreciated Psych: euthymic mood, full affect   EKG:  done today and reviewed by myself SB 48bpm, RBBB   05/09/22: TTE 1. Left ventricular ejection fraction, by estimation, is 65 to 70%. Left  ventricular ejection fraction by 3D volume is 70 %. The left ventricle has  normal function. The left ventricle has no regional wall motion  abnormalities. Left ventricular diastolic   parameters are consistent with Grade I diastolic dysfunction (impaired  relaxation).   2. Right ventricular systolic function is normal. The right ventricular  size is normal. There is normal pulmonary artery systolic pressure. The  estimated right ventricular systolic pressure is 31.5 mmHg.   3. Left atrial size was moderately dilated.   4. Right atrial size was moderately dilated.   5. The mitral valve is normal in structure. Trivial mitral valve  regurgitation. No evidence of mitral stenosis.   6. The aortic valve is tricuspid. Aortic valve regurgitation is trivial.  No aortic stenosis is present.   7. The inferior vena cava is normal in size with greater than 50%  respiratory variability, suggesting right atrial pressure of 3 mmHg.   Comparison(s): No significant change from prior study. Prior images  reviewed side by side.   08/02/21: TTE  1. Left ventricular ejection fraction, by estimation, is 60 to 65%. The  left ventricle has normal function. The left ventricle has no regional  wall motion abnormalities. There is mild asymmetric left ventricular  hypertrophy of the basal-septal segment.  Left ventricular diastolic parameters are consistent with Grade I  diastolic dysfunction (impaired relaxation). The average  left ventricular  global longitudinal strain is -21.8 %. The global longitudinal strain is  normal.   2. Right ventricular systolic function is normal. The right ventricular  size is normal. There is normal pulmonary artery systolic pressure.   3. Left atrial size was severely dilated.   4. The mitral valve is normal in structure. Trivial mitral valve  regurgitation. No evidence of mitral stenosis.   5. The aortic valve is tricuspid. Aortic valve regurgitation is trivial.  No aortic  stenosis is present.   6. The inferior vena cava is normal in size with greater than 50%  respiratory variability, suggesting right atrial pressure of 3 mmHg.    01/11/2019: [facility] IMPRESSION: Erika Lee has normal coronary arteries and normal LV function.  The etiology of her chest pain, elevated troponins, EKG changes and wall motion and imagine 2D echo are still unclear.  She has normal LV function by ventriculography.  She may have had coronary spasm.  The sheath was removed and a TR band was placed on the right wrist to achieve patent hemostasis.  The patient left lab in stable condition.  She can be discharged home later today on her home dose of Eliquis oral anticoagulation.   01/10/2019: TTE IMPRESSIONS  1. Severe akinesis of the left ventricular, mid-apical anteroseptal wall.   2. The left ventricle has low normal systolic function, with an ejection  fraction of 50-55%. The cavity size was normal. There is moderately  increased left ventricular wall thickness. Left ventricular diastolic  Doppler parameters are consistent with  impaired relaxation.   3. The right ventricle has normal systolic function. The cavity was  normal. There is no increase in right ventricular wall thickness.  FINDINGS   Left Ventricle: The left ventricle has low normal systolic function, with  an ejection fraction of 50-55%. The cavity size was normal. There is  moderately increased left ventricular wall thickness. Left  ventricular  diastolic Doppler parameters are  consistent with impaired relaxation. Severe akinesis of the left  ventricular, mid-apical anteroseptal wall.   Right Ventricle: The right ventricle has normal systolic function. The  cavity was normal. There is no increase in right ventricular wall  thickness.   Left Atrium: Left atrial size was normal in size.   Right Atrium: Right atrial size was normal in size. Right atrial pressure  is estimated at 10 mmHg.   Interatrial Septum: No atrial level shunt detected by color flow Doppler.   Pericardium: There is no evidence of pericardial effusion.   Mitral Valve: The mitral valve is normal in structure. Mitral valve  regurgitation is trivial by color flow Doppler.   Tricuspid Valve: The tricuspid valve is normal in structure. Tricuspid  valve regurgitation was not visualized by color flow Doppler.   Aortic Valve: The aortic valve is normal in structure. Aortic valve  regurgitation was not visualized by color flow Doppler.   Pulmonic Valve: The pulmonic valve was normal in structure. Pulmonic valve  regurgitation is not visualized by color flow Doppler.   Venous: The inferior vena cava is normal in size with greater than 50%  respiratory variability.    07/15/17: TTE Study Conclusions - Left ventricle: Systolic function was normal. The   estimated ejection fraction was in the range of 55% to   60%. Wall motion was normal; there were no regional wall   motion abnormalities. - Aortic valve: No evidence of vegetation. - Mitral valve: No evidence of vegetation. Mild   regurgitation. - Left atrium: The atrium was mildly dilated. No evidence of   thrombus in the atrial cavity or appendage. - Atrial septum: No defect or patent foramen ovale was   identified. There was redundancy of the septum, with   borderline criteria for aneurysm. - Tricuspid valve: No evidence of vegetation.   Recent Labs: 05/05/2023: ALT 11; BUN 14; Creat  0.85; Hemoglobin 15.0; Platelets 181; Potassium 4.3; Sodium 141; TSH 1.82  05/05/2023: Cholesterol 122; HDL 48; LDL Cholesterol (Calc) 54; Total CHOL/HDL Ratio 2.5;  Triglycerides 117   CrCl cannot be calculated (Patient's most recent lab result is older than the maximum 21 days allowed.).   Wt Readings from Last 3 Encounters:  05/05/23 140 lb (63.5 kg)  11/14/22 142 lb 9.6 oz (64.7 kg)  11/01/22 147 lb (66.7 kg)     Other studies reviewed: Additional studies/records reviewed today include: summarized above  ASSESSMENT AND PLAN:  1. Persistent AFib, Aflutter     S/p CTI and PVI ablation 2015      CHA2DS2Vasc is 4 , on Eliquis, appropriately dosed  2. CHF ?HFpEF No exam findings to suggest volume OL  3. HTN     looks ok  4. Bradycardia 5. New RBBB Ambulated in the office with her today, a full lap, mentioned feeling a bit SOB about 1/2 way, did not need to stop )2 sat remained 95-97% HRs 68-73bpm (by pulse ox)  Prior few EKGs w/HR 50's  Will have her wear a 3 day monitor Discussed I do not suspect she needs a PPM at this time, though may in the future Avoid nodal blocking agents   6. Secondary hypercoagulable state      Disposition: back in 2 mo, sooner if needed    Current medicines are reviewed at length with the patient today.  The patient did not have any concerns regarding medicines.  Erika Fredrickson, PA-C 05/27/2023 7:31 AM     [facility] [address] [place] [address] [phone] (office)  [phone] (fax)

## 2023-05-28 ENCOUNTER — Ambulatory Visit: Payer: PPO | Admitting: Physician Assistant

## 2023-06-02 ENCOUNTER — Encounter: Payer: Self-pay | Admitting: Physician Assistant

## 2023-06-02 ENCOUNTER — Ambulatory Visit: Payer: PPO | Attending: Physician Assistant | Admitting: Physician Assistant

## 2023-06-02 VITALS — BP 140/62 | HR 48 | Ht 65.0 in | Wt 138.6 lb

## 2023-06-02 DIAGNOSIS — R001 Bradycardia, unspecified: Secondary | ICD-10-CM

## 2023-06-02 DIAGNOSIS — D6869 Other thrombophilia: Secondary | ICD-10-CM | POA: Diagnosis not present

## 2023-06-02 DIAGNOSIS — I10 Essential (primary) hypertension: Secondary | ICD-10-CM

## 2023-06-02 DIAGNOSIS — I4819 Other persistent atrial fibrillation: Secondary | ICD-10-CM | POA: Diagnosis not present

## 2023-06-02 DIAGNOSIS — I4510 Unspecified right bundle-branch block: Secondary | ICD-10-CM | POA: Diagnosis not present

## 2023-06-02 NOTE — Patient Instructions (Addendum)
Medication Instructions:   Your physician recommends that you continue on your current medications as directed. Please refer to the Current Medication list given to you today.  *If you need a refill on your cardiac medications before your next appointment, please call your pharmacy*   Lab Work: NONE ORDERED  TODAY    If you have labs (blood work) drawn today and your tests are completely normal, you will receive your results only by: MyChart Message (if you have MyChart) OR A paper copy in the mail If you have any lab test that is abnormal or we need to change your treatment, we will call you to review the results.   Testing/Procedures: Your physician has recommended that you wear an event monitor. Event monitors are medical devices that record the heart's electrical activity. Doctors most often [place] these monitors to diagnose arrhythmias. Arrhythmias are problems with the speed or rhythm of the heartbeat. The monitor is a small, portable device. You can wear one while you do your normal daily activities. This is usually used to diagnose what is causing palpitations/syncope (passing out).   Follow-Up: At [facility], you and your health needs are our priority.  As part of our continuing mission to provide you with exceptional heart care, we have created designated Provider Care Teams.  These Care Teams include your primary Cardiologist (physician) and Advanced Practice Providers (APPs -  Physician Assistants and Nurse Practitioners) who all work together to provide you with the care you need, when you need it.  We recommend signing up for the patient portal called "MyChart".  Sign up information is provided on this After Visit Summary.  MyChart is used to connect with patients for Virtual Visits (Telemedicine).  Patients are able to view lab/test results, encounter notes, upcoming appointments, etc.  Non-urgent messages can be sent to your provider as well.   To learn more about what  you can do with MyChart, go to [email or web address].    Your next appointment:   2 month(s)  Provider:   Francis Dowse, PA-C    Other Instructions  Erika Lee- Long Term Monitor Instructions  Your physician has requested you wear a ZIO patch monitor for 3 days.  This is a single patch monitor. [employer] supplies one patch monitor per enrollment. Additional stickers are not available. Please do not apply patch if you will be having a Nuclear Stress Test,  Echocardiogram, Cardiac CT, MRI, or Chest Xray during the period you would be wearing the  monitor. The patch cannot be worn during these tests. You cannot remove and re-apply the  ZIO XT patch monitor.  Your ZIO patch monitor will be mailed 3 day USPS to your address on file. It may take 3-5 days  to receive your monitor after you have been enrolled.  Once you have received your monitor, please review the enclosed instructions. Your monitor  has already been registered assigning a specific monitor serial # to you.  Billing and Patient Assistance Program Information  We have supplied [employer] with any of your insurance information on file for billing purposes. [employer] offers a sliding scale Patient Assistance Program for patients that do not have  insurance, or whose insurance does not completely cover the cost of the ZIO monitor.  You must apply for the Patient Assistance Program to qualify for this discounted rate.  To apply, please call [employer] at [phone], select option 4, select option 2, ask to apply for  Patient Assistance Program. [name] will ask your household  income, and how many people  are in your household. They will quote your out-of-pocket cost based on that information.  [employer] will also be able to set up a 83-month, interest-free payment plan if needed.  Applying the monitor   Shave hair from upper left chest.  Hold abrader disc by orange tab. Rub abrader in 40 strokes over the upper left chest as  indicated in  your monitor instructions.  Clean area with 4 enclosed alcohol pads. Let dry.  Apply patch as indicated in monitor instructions. Patch will be placed under collarbone on left  side of chest with arrow pointing upward.  Rub patch adhesive wings for 2 minutes. Remove white label marked "1". Remove the white  label marked "2". Rub patch adhesive wings for 2 additional minutes.  While looking in a mirror, press and release button in center of patch. A small green light will  flash 3-4 times. This will be your only indicator that the monitor has been turned on.  Do not shower for the first 24 hours. You may shower after the first 24 hours.  Press the button if you feel a symptom. You will hear a small click. Record Date, Time and  Symptom in the Patient Logbook.  When you are ready to remove the patch, follow instructions on the last 2 pages of Patient  Logbook. Stick patch monitor onto the last page of Patient Logbook.  Place Patient Logbook in the blue and white box. Use locking tab on box and tape box closed  securely. The blue and white box has prepaid postage on it. Please place it in the mailbox as  soon as possible. Your physician should have your test results approximately 7 days after the  monitor has been mailed back to [facility].  Call [facility] Customer Care at [phone] if you have questions regarding  your ZIO XT patch monitor. Call them immediately if you see an orange light blinking on your  monitor.  If your monitor falls off in less than 4 days, contact our Monitor department at [phone].  If your monitor becomes loose or falls off after 4 days call [employer] at [phone] for  suggestions on securing your monitor

## 2023-06-03 ENCOUNTER — Ambulatory Visit: Payer: PPO | Attending: Physician Assistant

## 2023-06-03 DIAGNOSIS — R001 Bradycardia, unspecified: Secondary | ICD-10-CM

## 2023-06-03 NOTE — Progress Notes (Unsigned)
Enrolled patient for a 3 day Zio XT monitor to be mailed to patients home   Erika Lee to read

## 2023-06-09 ENCOUNTER — Other Ambulatory Visit: Payer: Self-pay | Admitting: Family Medicine

## 2023-06-09 DIAGNOSIS — R001 Bradycardia, unspecified: Secondary | ICD-10-CM

## 2023-06-09 MED ORDER — HYDROCODONE-ACETAMINOPHEN 10-325 MG PO TABS: 1.0000 | ORAL_TABLET | Freq: Four times a day (QID) | ORAL | 0 refills | Status: DC | PRN

## 2023-06-09 NOTE — Telephone Encounter (Signed)
Copied from CRM [id number]. Topic: Clinical - Medication Refill >> Jun 09, 2023  2:26 PM Gaetano Hawthorne wrote: Most Recent Primary Care Visit:  Provider: Mickeal Needy  Department: [facility]  Visit Type: MEDICARE AWV, SEQUENTIAL  Date: 05/22/2023  Medication: ***  Has the patient contacted their pharmacy?  (Agent: If no, request that the patient contact the pharmacy for the refill. If patient does not wish to contact the pharmacy document the reason why and proceed with request.) (Agent: If yes, when and what did the pharmacy advise?)  Is this the correct pharmacy for this prescription?  If no, delete pharmacy and type the correct one.  This is the patient's preferred pharmacy:  [id number] - [place], [place] - [address] [place] [address] Phone: [phone] Fax: [phone]  [id number] - [place], [place] - [address] AT [facility] OF [address] [place] [address] Phone: [phone] Fax: [phone]  [facility] - [place], [place] - [address] [place] [address] Phone: [phone] Fax: [phone]   Has the prescription been filled recently?   Is the patient out of the medication?   Has the patient been seen for an appointment in the last year OR does the patient have an upcoming appointment?   Can we respond through MyChart?   Agent: Please be advised that Rx refills may take up to 3 business days. We ask that you follow-up with your pharmacy.

## 2023-06-12 ENCOUNTER — Ambulatory Visit: Payer: PPO | Admitting: Family Medicine

## 2023-06-12 ENCOUNTER — Encounter: Payer: Self-pay | Admitting: Family Medicine

## 2023-06-12 VITALS — BP 122/78 | HR 58 | Temp 98.2°F | Ht 65.0 in | Wt 142.2 lb

## 2023-06-12 DIAGNOSIS — Z7984 Long term (current) use of oral hypoglycemic drugs: Secondary | ICD-10-CM | POA: Diagnosis not present

## 2023-06-12 DIAGNOSIS — M549 Dorsalgia, unspecified: Secondary | ICD-10-CM

## 2023-06-12 DIAGNOSIS — E118 Type 2 diabetes mellitus with unspecified complications: Secondary | ICD-10-CM | POA: Diagnosis not present

## 2023-06-12 MED ORDER — EMPAGLIFLOZIN 10 MG PO TABS: 10.0000 mg | ORAL_TABLET | Freq: Every day | ORAL | 3 refills | Status: DC

## 2023-06-12 MED ORDER — SIMVASTATIN 40 MG PO TABS: 40.0000 mg | ORAL_TABLET | Freq: Every day | ORAL | 2 refills | Status: DC

## 2023-06-12 MED ORDER — SPIRONOLACTONE 25 MG PO TABS: 12.5000 mg | ORAL_TABLET | Freq: Every day | ORAL | 3 refills | Status: DC

## 2023-06-12 MED ORDER — DIAZEPAM 5 MG PO TABS: ORAL_TABLET | ORAL | 3 refills | Status: DC

## 2023-06-12 MED ORDER — APIXABAN 5 MG PO TABS: 5.0000 mg | ORAL_TABLET | Freq: Two times a day (BID) | ORAL | 3 refills | Status: DC

## 2023-06-12 MED ORDER — LISINOPRIL 40 MG PO TABS: 40.0000 mg | ORAL_TABLET | Freq: Every day | ORAL | 3 refills | Status: DC

## 2023-06-12 NOTE — Progress Notes (Signed)
Subjective:    Patient ID: Erika Lee, female    DOB: [birth year], 77 y.o.   MRN: [id number] Patient has a history of coronary artery disease, type 2 diabetes, hypertension, hyperlipidemia, and diastolic heart failure.  Patient presents today complaining of tightness of bilateral level of T7-T8.  The pain is just to the right of midline.  It is just below the angle of the scapula.  There is a point of severe pain in that area that hurts to palpate.  She states that she saw Dr. Jillyn Hidden who performed an injection in that area that helped for quite some time.  Patient denies any falls.  She denies any trauma.  Patient has a CT scan of the lungs/CT angiogram from 2023.  There is degenerative disease at multiple levels throughout the thoracic spine with visible osteophytes.  I believe this is likely the source of her pain. Past Medical History:  Diagnosis Date   Anticoagulant long-term use    eliquis   Chronic sinusitis    GERD (gastroesophageal reflux disease)    Heart failure with preserved ejection fraction (HCC)    Hemorrhoids    History of colitis 10/2016   campylobacter   Hyperlipidemia    Hypertension    Multinodular goiter    per pt has had 2 biopsy's both benign   NSTEMI (non-ST elevated myocardial infarction) (HCC)    12/2018- normal coronary arteries on cath, poss coronary vasospasm.    Osteoarthritis    "all over"  and [facility] joint left shoulder   Paroxysmal atrial fibrillation [facility]) EP cardiologist--  dr Johney Frame   s/p PVI by Dr Johney Frame 07/16/2013;   pt first dx 2008,  recurrence 2014 afib/flutter and tachy-brady   Rotator cuff tear, left    Shoulder impingement, left    Status post placement of implantable loop recorder    original placement 07-26-2014;  removal / replacement 10-17-2017  by dr allred   Type 2 diabetes mellitus (HCC)    followed by pcp   Past Surgical History:  Procedure Laterality Date   ATRIAL FIBRILLATION ABLATION N/A 07/16/2013   PVI and CTI ablation by Dr  Johney Frame   CARDIAC CATHETERIZATION     01/11/2019- Normal coronary arteries.    COLONOSCOPY  09/10/2011   Dr. Lovell Sheehan: normal, 10 year follow up.   implantable loop recorder removal  04/25/2021   MDT LINQ removed by Dr Johney Frame   LEFT HEART CATH AND CORONARY ANGIOGRAPHY N/A 01/11/2019   Procedure: LEFT HEART CATH AND CORONARY ANGIOGRAPHY;  Surgeon: Runell Gess, MD;  Location: [facility] IN[facility]AS[facility];  Service: Cardiovascular;  Laterality: N/A;   LESION REMOVAL N/A 05/03/2013   Procedure: EXCISION CYST, BACK;  Surgeon: Dalia Heading, MD;  Location: [facility];  Service: General;  Laterality: N/A;   LOOP RECORDER IMPLANT N/A 07/26/2014   Procedure: LOOP RECORDER IMPLANT;  Surgeon: Hillis Range, MD;  Location: [facility] CATH LAB;  Service: Cardiovascular;  Laterality: N/A;   LOOP RECORDER INSERTION N/A 10/17/2017   MDT previously implanted ILR for afib management at [facility].  old device removed and new device placed by Dr Johney Frame   LOOP RECORDER REMOVAL N/A 10/17/2017   MDT ILR removed with new device subsequently replaced   SHOULDER ARTHROSCOPY WITH ROTATOR CUFF REPAIR AND SUBACROMIAL DECOMPRESSION Left 01/07/2019   Procedure: Left shoulder subacromial decompression, distal clavicle resection, extensive debridement,;  Surgeon: Yolonda Kida, MD;  Location: [facility];  Service: Orthopedics;  Laterality: Left;  TEE WITHOUT CARDIOVERSION N/A 07/15/2013   Procedure: TRANSESOPHAGEAL ECHOCARDIOGRAM (TEE);  Surgeon: Lewayne Bunting, MD;  Location: [facility] ENDOSCOPY;  Service: Cardiovascular;  Laterality: N/A;   TONSILLECTOMY  age 68   TRANSANAL HEMORRHOIDAL DEARTERIALIZATION N/A 09/22/2015   Procedure: TRANSANAL HEMORRHOIDAL LIGATION/PEXY EUA POSSIBLE HEMORRHOIDECTOMY ;  Surgeon: Karie Soda, MD;  Location: [facility];  Service: General;  Laterality: N/A;   Current Outpatient Medications on File Prior to Visit  Medication Sig Dispense Refill   diazepam (VALIUM) 5 MG tablet TAKE 1 TABLET(5 MG) BY  MOUTH AT BEDTIME AS NEEDED FOR SLEEP 30 tablet 3   ELIQUIS 5 MG TABS tablet TAKE 1 TABLET(5 MG) BY MOUTH TWICE DAILY 60 tablet 3   empagliflozin (JARDIANCE) 10 MG TABS tablet Take 1 tablet (10 mg total) by mouth daily. TAKE 1 TABLET(10 MG) BY MOUTH DAILY 30 tablet 3   glucose blood (FREESTYLE PRECISION NEO TEST) test strip DX: E11.9 Use to check blood sugar daily 100 strip 5   HYDROcodone-acetaminophen (NORCO) 10-325 MG tablet Take 1 tablet by mouth every 6 (six) hours as needed. 120 tablet 0   Lancets (FREESTYLE) lancets Use to check blood sugar daily. 100 each 5   lisinopril (ZESTRIL) 40 MG tablet TAKE 1 TABLET(40 MG) BY MOUTH DAILY 90 tablet 3   ondansetron (ZOFRAN) 4 MG tablet Take 1 tablet (4 mg total) by mouth every 8 (eight) hours as needed for nausea or vomiting. 20 tablet 0   promethazine (PHENERGAN) 12.5 MG tablet TAKE 1 TABLET(12.5 MG) BY MOUTH EVERY 6 HOURS AS NEEDED FOR NAUSEA OR VOMITING 30 tablet 3   simvastatin (ZOCOR) 40 MG tablet TAKE 1 TABLET(40 MG) BY MOUTH AT BEDTIME 90 tablet 2   spironolactone (ALDACTONE) 25 MG tablet Take 0.5 tablets (12.5 mg total) by mouth daily. 90 tablet 3   No current facility-administered medications on file prior to visit.     Allergies  Allergen Reactions   Clindamycin/Lincomycin Rash   Doxycycline Other (See Comments)    Makes heart race   Keflex [Cephalexin] Nausea And Vomiting   Other    Sulfonic Acid (3,5-Dibromo-4-H-Ox-Benz)    Adhesive [Tape] Rash   Latex Rash   Sulfonamide Derivatives Nausea And Vomiting   Social History   Socioeconomic History   Marital status: Married    Spouse name: Interior and spatial designer   Number of children: 2   Years of education: 12   Highest education level: 12th grade  Occupational History   Occupation: Advertising copywriter: RETIRED    Comment: Full time  Tobacco Use   Smoking status: Former    Current packs/day: 0.00    Types: Cigarettes    Start date: 01/03/1960    Quit date: 01/02/1994     Years since quitting: 29.4    Passive exposure: Past   Smokeless tobacco: Never  Vaping Use   Vaping status: Never Used  Substance and Sexual Activity   Alcohol use: No   Drug use: No   Sexual activity: Not Currently    Partners: Male  Other Topics Concern   Not on file  Social History Narrative   Spouse is on hospice care   One child deceased from homicide at age 62    Social Drivers of Health   Financial Resource Strain: Low Risk  (05/21/2022)   Overall Financial Resource Strain (CARDIA)    Difficulty of Paying Living Expenses: Not hard at all  Food Insecurity: No Food Insecurity (05/21/2022)   Hunger Vital Sign  Worried About Programme researcher, broadcasting/film/video in the Last Year: Never true    Ran Out of Food in the Last Year: Never true  Transportation Needs: No Transportation Needs (05/21/2022)   PRAPARE - Administrator, Civil Service (Medical): No    Lack of Transportation (Non-Medical): No  Physical Activity: Inactive (05/21/2022)   Exercise Vital Sign    Days of Exercise per Week: 0 days    Minutes of Exercise per Session: 0 min  Stress: Stress Concern Present (05/21/2022)   [employer] of Occupational Health - Occupational Stress Questionnaire    Feeling of Stress : To some extent  Social Connections: Moderately Integrated (05/21/2022)   Social Connection and Isolation Panel [NHANES]    Frequency of Communication with Friends and Family: More than three times a week    Frequency of Social Gatherings with Friends and Family: Once a week    Attends Religious Services: 1 to 4 times per year    Active Member of [employer] or Organizations: No    Attends Banker Meetings: Not on file    Marital Status: Married  Catering manager Violence: Not At Risk (05/21/2022)   Humiliation, Afraid, Rape, and Kick questionnaire    Fear of Current or Ex-Partner: No    Emotionally Abused: No    Physically Abused: No    Sexually Abused: No     Review of Systems  All  other systems reviewed and are negative.      Objective:   Physical Exam Vitals reviewed.  Constitutional:      General: She is not in acute distress.    Appearance: Normal appearance. She is not ill-appearing or toxic-appearing.  Eyes:     Extraocular Movements: Extraocular movements intact.     Conjunctiva/sclera: Conjunctivae normal.     Pupils: Pupils are equal, round, and reactive to light.  Cardiovascular:     Rate and Rhythm: Normal rate and regular rhythm.     Pulses: Normal pulses.     Heart sounds: Normal heart sounds. No murmur heard.    No friction rub. No gallop.  Pulmonary:     Effort: Pulmonary effort is normal. No respiratory distress.     Breath sounds: No stridor. No wheezing, rhonchi or rales.  Abdominal:     General: Abdomen is flat. Bowel sounds are normal. There is no distension.     Palpations: Abdomen is soft.     Tenderness: There is no abdominal tenderness. There is no guarding.  Musculoskeletal:     Cervical back: Neck supple.     Thoracic back: Tenderness and bony tenderness present. Decreased range of motion.       Back:  Skin:    General: Skin is warm.     Coloration: Skin is not jaundiced.     Findings: No bruising, erythema, lesion or rash.  Neurological:     General: No focal deficit present.     Mental Status: She is alert and oriented to person, place, and time.     Cranial Nerves: No cranial nerve deficit.     Sensory: No sensory deficit.     Motor: No weakness.     Coordination: Coordination normal.     Gait: Gait normal.     Deep Tendon Reflexes: Reflexes normal.  Psychiatric:        Mood and Affect: Mood normal.        Thought Content: Thought content normal.  Assessment & Plan:  Mid back pain - Plan: DG Thoracic Spine W/Swimmers  Controlled type 2 diabetes mellitus with complication, without long-term current use of insulin (HCC) - Plan: empagliflozin (JARDIANCE) 10 MG TABS tablet I believe the patient's pain  in her back is due to degenerative disc disease.  Obtain an x-ray of the thoracic spine to evaluate further and rule out vertebral fractures or other pathologic lesions.  If x-ray confirms degenerative disc disease, I would recommend the patient follow-up with her orthopedist for the trigger point injections that she had before.  This seemed to help her immensely.

## 2023-06-13 ENCOUNTER — Other Ambulatory Visit: Payer: Self-pay

## 2023-06-13 DIAGNOSIS — K838 Other specified diseases of biliary tract: Secondary | ICD-10-CM

## 2023-06-13 DIAGNOSIS — R112 Nausea with vomiting, unspecified: Secondary | ICD-10-CM

## 2023-06-13 NOTE — Telephone Encounter (Deleted)
Copied from CRM [id number]. Topic: Clinical - Medication Refill >> Jun 13, 2023 10:40 AM Adelina Mings wrote: Most Recent Primary Care Visit:  Provider: Lynnea Ferrier T  Department: [facility]  Visit Type: OFFICE VISIT  Date: 06/12/2023   Medication:  lorazepam   Has the patient contacted their pharmacy? Yes (Agent: If no, request that the patient contact the pharmacy for the refill. If patient does not wish to contact the pharmacy document the reason why and proceed with request.) (Agent: If yes, when and what did the pharmacy advise?)   Is this the correct pharmacy for this prescription? Yes If no, delete pharmacy and type the correct one.  This is the patient's preferred pharmacy:  [facility] - [place], [place] - [address] [place] [address] Phone: [phone] Fax: [phone]     Has the prescription been filled recently? No   Is the patient out of the medication? Yes   Has the patient been seen for an appointment in the last year OR does the patient have an upcoming appointment? Yes   Can we respond through MyChart? No   Agent: Please be advised that Rx refills may take up to 3 business days. We ask that you follow-up with your pharmacy.

## 2023-06-13 NOTE — Telephone Encounter (Signed)
Copied from CRM [id number]. Topic: Clinical - Medication Refill >> Jun 13, 2023 10:40 AM Adelina Mings wrote: Most Recent Primary Care Visit:  Provider: Lynnea Ferrier T  Department: [facility]  Visit Type: OFFICE VISIT  Date: 06/12/2023   Medication:  lorazepam   Has the patient contacted their pharmacy? Yes (Agent: If no, request that the patient contact the pharmacy for the refill. If patient does not wish to contact the pharmacy document the reason why and proceed with request.) (Agent: If yes, when and what did the pharmacy advise?)   Is this the correct pharmacy for this prescription? Yes If no, delete pharmacy and type the correct one.  This is the patient's preferred pharmacy:  [facility] - [place], [place] - [address] [place] [address] Phone: [phone] Fax: [phone]     Has the prescription been filled recently? No   Is the patient out of the medication? Yes   Has the patient been seen for an appointment in the last year OR does the patient have an upcoming appointment? Yes   Can we respond through MyChart? No   Agent: Please be advised that Rx refills may take up to 3 business days. We ask that you follow-up with your pharmacy.

## 2023-06-13 NOTE — Telephone Encounter (Signed)
Patient called, left VM on cell/home phone numbers listed to return the call to the office regarding refill. She asked for lorazepam, however diazepam was sent in on 06/12/23. Will need to clarify which she wants.

## 2023-06-18 DIAGNOSIS — R001 Bradycardia, unspecified: Secondary | ICD-10-CM | POA: Diagnosis not present

## 2023-07-08 MED ORDER — PROMETHAZINE HCL 12.5 MG PO TABS: ORAL_TABLET | ORAL | 3 refills | Status: DC

## 2023-07-08 NOTE — Telephone Encounter (Signed)
 Copied from CRM [id number]. Topic: Clinical - Medication Refill >> Jul 08, 2023  2:29 PM Rodell CROME wrote: Most Recent Primary Care Visit:  Provider: DUANNE LOWERS T  Department: [facility]  Visit Type: OFFICE VISIT  Date: 06/12/2023  Medication: promethazine  (PHENERGAN ) 12.5 MG tablet  Has the patient contacted their pharmacy? YES (Agent: If no, request that the patient contact the pharmacy for the refill. If patient does not wish to contact the pharmacy document the reason why and proceed with request.) (Agent: If yes, when and what did the pharmacy advise?)  Is this the correct pharmacy for this prescription? yYES If no, delete pharmacy and type the correct one.  This is the patient's preferred pharmacy:   [facility] - [place], [place] - [address] [place] [address] Phone: [phone] Fax: [phone]   Has the prescription been filled recently?NO  Is the patient out of the medication? yes  Has the patient been seen for an appointment in the last year OR does the patient have an upcoming appointment? YES  Can we respond through MyChart? YES  Agent: Please be advised that Rx refills may take up to 3 business days. We ask that you follow-up with your pharmacy.

## 2023-07-14 ENCOUNTER — Other Ambulatory Visit: Payer: Self-pay | Admitting: Family Medicine

## 2023-07-14 NOTE — Telephone Encounter (Signed)
 Copied from CRM [id number]. Topic: Clinical - Medication Refill >> Jul 14, 2023 11:17 AM Zacyrah J wrote: Most Recent Primary Care Visit:  Provider: DUANNE LOWERS T  Department: [facility]  Visit Type: OFFICE VISIT  Date: 06/12/2023  Medication: HYDROcodone -acetaminophen  (NORCO) 10-325 MG tablet  Has the patient contacted their pharmacy? Yes (Agent: If no, request that the patient contact the pharmacy for the refill. If patient does not wish to contact the pharmacy document the reason why and proceed with request.) (Agent: If yes, when and what did the pharmacy advise?)  Is this the correct pharmacy for this prescription? Yes If no, delete pharmacy and type the correct one.  This is the patient's preferred pharmacy:  [facility] - [place], [place] - [address] [place] [address] Phone: [phone] Fax: [phone]   Has the prescription been filled recently?   Is the patient out of the medication?   Has the patient been seen for an appointment in the last year OR does the patient have an upcoming appointment?   Can we respond through MyChart?   Agent: Please be advised that Rx refills may take up to 3 business days. We ask that you follow-up with your pharmacy.

## 2023-07-15 MED ORDER — HYDROCODONE-ACETAMINOPHEN 10-325 MG PO TABS: 1.0000 | ORAL_TABLET | Freq: Four times a day (QID) | ORAL | 0 refills | Status: DC | PRN

## 2023-08-02 ENCOUNTER — Other Ambulatory Visit: Payer: Self-pay | Admitting: Family Medicine

## 2023-08-03 NOTE — Progress Notes (Unsigned)
Cardiology Office Note Date:  08/03/2023  Patient ID:  Erika Lee, Erika Lee 1945-12-01, MRN [id number] PCP:  Donita Brooks, MD  Electrophysiologist:  Dr. Johney Frame >> Dr. Lalla Brothers   Chief Complaint:    f/u on low HRs  History of Present Illness: Erika Lee is a 78 y.o. female with history of DM, GERD, HTN, AFlutter/AFib (s/p CTI and PVI ablation 2015), July 2020 suffered NSTEMI > echo noted preserved LVEF with new WMA > cath with normal coronaries, possibly had spasm.  She saw Dr. 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  Planned for 6 mo visit.  I saw her Oct 2023 She is primary caregiver for her husband who unfortunately has suffered a catastrophic stroke, requiring full care.  She reports that she has always had just a little swelling at her ankles, but about 3 mo or so ago, started to notice more and more. Since the unna boots were removed she says they did look better, have been about the same since then, not better or worse. She has lost a few pounds though. No CP, denies any unusual SOB. She mentioned some SOB when she saw Dr. Johney Frame in July, stopping the Toprol did not change it, but it isnt worse either. Not so SOB that she has to stop or rest. No palpitations or Afib No near syncope or syncope. PMD following her diuretics, planned to update her echo Planned to transition to Dr. Lalla Brothers  LVEF 65-70%, no WMA, RV OK, , no sign VHD  She saw Dr. Lalla Brothers 08/07/22, fluctuation in HR/rhythm, unable to tell if/when she is in AFib, compliant with Eliquis.  Mentioned on Jardiance, off metoprolol.  No changes were made.  Saw Dr. Tanya Nones 05/05/23, caring for her husband was weighing on her, tired. + edema, no SOB/nocturnal symptoms Planned for labs HR was 46 BP 130/72   I saw he 06/02/23 She continues to be the caregiver  for her husband now over a year post stroke, requiring total care. She denies CP, palpitations or cardiac awareness No dizzy spells, no near syncope or syncope. She does get SOB with heavier activities, gives an example of bring the trash cans to the road, or carrying groceries , not with usual ADLs or care for her husband. No bleeding or signs of bleeding Her observation of HRs at home 40's-50's when she checks EKG was SB 48, RBBB Ambulated in the office HR >> 68-73bpm, O2 sats 95-97%, she did report feeling a bit SOB, did not have to stop Planned for a monitor did not suspect she needed a pacer in the short term at least On no nodal blocking agents  Monitor looked OK.  HRs were OK with average rate 56bpm  No AFib  TODAY She reports that in d/w Hospice team, suspect he has started the dying process Not eating much, she continues to be the primary caregiver for him She hopes the process will be painless and peacful for him, very hard to watch. No CP, palpitations Her back bothers her, hoping to get an injection again soon. No near syncope or syncope She is tired The same DOE, not escalating, the same examples of bring the trash cans to the road, or carrying groceries get her winded.  No SOB DOE otherwsie  AFib/AAD hx Diagnosed 2008 Flecainide failed to maintain  SR (2015) PVI ablation 07/16/2013  Device information: MDT ILR implanted 10/17/17, AFib surveillance  RRT Removed 04/25/21   Past Medical History:  Diagnosis Date   Anticoagulant long-term use    eliquis   Chronic sinusitis    GERD (gastroesophageal reflux disease)    Heart failure with preserved ejection fraction (HCC)    Hemorrhoids    History of colitis 10/2016   campylobacter   Hyperlipidemia    Hypertension    Multinodular goiter    per pt has had 2 biopsy's both benign   NSTEMI (non-ST elevated myocardial infarction) (HCC)    12/2018- normal coronary arteries on cath, poss coronary vasospasm.     Osteoarthritis    "all over"  and [facility] left shoulder   Paroxysmal atrial fibrillation [facility]) EP cardiologist--  dr Johney Frame   s/p PVI by Dr Johney Frame 07/16/2013;   pt first dx 2008,  recurrence 2014 afib/flutter and tachy-brady   Rotator cuff tear, left    Shoulder impingement, left    Status post placement of implantable loop recorder    original placement 07-26-2014;  removal / replacement 10-17-2017  by dr allred   Type 2 diabetes mellitus (HCC)    followed by pcp    Past Surgical History:  Procedure Laterality Date   ATRIAL FIBRILLATION ABLATION N/A 07/16/2013   PVI and CTI ablation by Dr Johney Frame   CARDIAC CATHETERIZATION     01/11/2019- Normal coronary arteries.    COLONOSCOPY  09/10/2011   Dr. Lovell Sheehan: normal, 10 year follow up.   implantable loop recorder removal  04/25/2021   MDT LINQ removed by Dr Johney Frame   LEFT HEART CATH AND CORONARY ANGIOGRAPHY N/A 01/11/2019   Procedure: LEFT HEART CATH AND CORONARY ANGIOGRAPHY;  Surgeon: Runell Gess, MD;  Location: [facility] INVAS[facility];  Service: Cardiovascular;  Laterality: N/A;   LESION REMOVAL N/A 05/03/2013   Procedure: EXCISION CYST, BACK;  Surgeon: Dalia Heading, MD;  Location: [facility] ORS;  Service: General;  Laterality: N/A;   LOOP RECORDER IMPLANT N/A 07/26/2014   Procedure: LOOP RECORDER IMPLANT;  Surgeon: Hillis Range, MD;  Location: [facility];  Service: Cardiovascular;  Laterality: N/A;   LOOP RECORDER INSERTION N/A 10/17/2017   MDT previously implanted ILR for afib management at RRT.  old device removed and new device placed by Dr Johney Frame   LOOP RECORDER REMOVAL N/A 10/17/2017   MDT ILR removed with new device subsequently replaced   SHOULDER ARTHROSCOPY WITH ROTATOR CUFF REPAIR AND SUBACROMIAL DECOMPRESSION Left 01/07/2019   Procedure: Left shoulder subacromial decompression, distal clavicle resection, extensive debridement,;  Surgeon: Yolonda Kida, MD;  Location: [facility];  Service: Orthopedics;   Laterality: Left;   TEE WITHOUT CARDIOVERSION N/A 07/15/2013   Procedure: TRANSESOPHAGEAL ECHOCARDIOGRAM (TEE);  Surgeon: Lewayne Bunting, MD;  Location: [facility] ENDOSCOPY;  Service: Cardiovascular;  Laterality: N/A;   TONSILLECTOMY  age 71   TRANSANAL HEMORRHOIDAL DEARTERIALIZATION N/A 09/22/2015   Procedure: TRANSANAL HEMORRHOIDAL LIGATION/PEXY EUA POSSIBLE HEMORRHOIDECTOMY ;  Surgeon: Karie Soda, MD;  Location: [facility] ORS;  Service: General;  Laterality: N/A;    Current Outpatient Medications  Medication Sig Dispense Refill   apixaban (ELIQUIS) 5 MG TABS tablet Take 1 tablet (5 mg total) by mouth 2 (two) times daily. 60 tablet 3   diazepam (VALIUM) 5 MG tablet TAKE 1 TABLET(5 MG) BY MOUTH AT BEDTIME AS NEEDED FOR SLEEP 30 tablet 3   empagliflozin (JARDIANCE) 10 MG TABS tablet Take 1 tablet (10 mg total)  by mouth daily. TAKE 1 TABLET(10 MG) BY MOUTH DAILY 90 tablet 3   glucose blood (FREESTYLE PRECISION NEO TEST) test strip DX: E11.9 Use to check blood sugar daily 100 strip 5   HYDROcodone-acetaminophen (NORCO) 10-325 MG tablet Take 1 tablet by mouth every 6 (six) hours as needed. 120 tablet 0   Lancets (FREESTYLE) lancets Use to check blood sugar daily. 100 each 5   lisinopril (ZESTRIL) 40 MG tablet Take 1 tablet (40 mg total) by mouth daily. 90 tablet 3   ondansetron (ZOFRAN) 4 MG tablet Take 1 tablet (4 mg total) by mouth every 8 (eight) hours as needed for nausea or vomiting. 20 tablet 0   promethazine (PHENERGAN) 12.5 MG tablet TAKE 1 TABLET(12.5 MG) BY MOUTH EVERY 6 HOURS AS NEEDED FOR NAUSEA OR VOMITING 30 tablet 3   simvastatin (ZOCOR) 40 MG tablet Take 1 tablet (40 mg total) by mouth daily at 6 PM. 90 tablet 2   spironolactone (ALDACTONE) 25 MG tablet Take 0.5 tablets (12.5 mg total) by mouth daily. 90 tablet 3   No current facility-administered medications for this visit.    Allergies:   Clindamycin/lincomycin; Doxycycline; Keflex [cephalexin]; Other; Sulfonic acid  (3,5-dibromo-4-h-ox-benz); Adhesive [tape]; Latex; and Sulfonamide derivatives   Social History:  The patient  reports that she quit smoking about 29 years ago. Her smoking use included cigarettes. She started smoking about 63 years ago. She has been exposed to tobacco smoke. She has never used smokeless tobacco. She reports that she does not drink alcohol and does not use drugs.   Family History:  The patient's family history includes Kidney disease in her maternal grandfather; Other in her maternal grandfather; Stroke in her maternal grandmother; Stroke (age of onset: 66) in her mother.  ROS:  Please see the history of present illness.    All other systems are reviewed and otherwise negative.   PHYSICAL EXAM:  VS:  LMP  (LMP Unknown)  BMI: There is no height or weight on file to calculate BMI. Well nourished, well developed, in no acute distress  HEENT: normocephalic, atraumatic  Neck: no JVD, carotid bruits or masses Cardiac:  RRR; no significant murmurs, no rubs, or gallops Lungs: CTA b/l, no wheezing, rhonchi or rales  Abd: soft, nontender MS: no deformity or atrophy Ext: numerous spider veins, she has some ankle edema, not particularly pitting Skin: warm and dry, no rash Neuro:  No gross deficits appreciated Psych: euthymic mood, full affect   EKG:  not done today  Dec 2024: Monitor HR 40 - 169 bpm, average 56 bpm. 68 SVT, longest 37.9 seconds with an average rate of 116 bpm Occasional supraventricular ectopy, 1.6% Rare ventricular ectopy. No atrial fibrillation.  05/09/22: TTE 1. Left ventricular ejection fraction, by estimation, is 65 to 70%. Left  ventricular ejection fraction by 3D volume is 70 %. The left ventricle has  normal function. The left ventricle has no regional wall motion  abnormalities. Left ventricular diastolic   parameters are consistent with Grade I diastolic dysfunction (impaired  relaxation).   2. Right ventricular systolic function is normal. The  right ventricular  size is normal. There is normal pulmonary artery systolic pressure. The  estimated right ventricular systolic pressure is 31.5 mmHg.   3. Left atrial size was moderately dilated.   4. Right atrial size was moderately dilated.   5. The mitral valve is normal in structure. Trivial mitral valve  regurgitation. No evidence of mitral stenosis.   6. The aortic valve is  tricuspid. Aortic valve regurgitation is trivial.  No aortic stenosis is present.   7. The inferior vena cava is normal in size with greater than 50%  respiratory variability, suggesting right atrial pressure of 3 mmHg.   Comparison(s): No significant change from prior study. Prior images  reviewed side by side.   08/02/21: TTE  1. Left ventricular ejection fraction, by estimation, is 60 to 65%. The  left ventricle has normal function. The left ventricle has no regional  wall motion abnormalities. There is mild asymmetric left ventricular  hypertrophy of the basal-septal segment.  Left ventricular diastolic parameters are consistent with Grade I  diastolic dysfunction (impaired relaxation). The average left ventricular  global longitudinal strain is -21.8 %. The global longitudinal strain is  normal.   2. Right ventricular systolic function is normal. The right ventricular  size is normal. There is normal pulmonary artery systolic pressure.   3. Left atrial size was severely dilated.   4. The mitral valve is normal in structure. Trivial mitral valve  regurgitation. No evidence of mitral stenosis.   5. The aortic valve is tricuspid. Aortic valve regurgitation is trivial.  No aortic stenosis is present.   6. The inferior vena cava is normal in size with greater than 50%  respiratory variability, suggesting right atrial pressure of 3 mmHg.    01/11/2019: [facility] IMPRESSION: Ms. Dehaas has normal coronary arteries and normal LV function.  The etiology of her chest pain, elevated troponins, EKG changes and wall  motion and imagine 2D echo are still unclear.  She has normal LV function by ventriculography.  She may have had coronary spasm.  The sheath was removed and a TR band was placed on the right wrist to achieve patent hemostasis.  The patient left lab in stable condition.  She can be discharged home later today on her home dose of Eliquis oral anticoagulation.   01/10/2019: TTE IMPRESSIONS  1. Severe akinesis of the left ventricular, mid-apical anteroseptal wall.   2. The left ventricle has low normal systolic function, with an ejection  fraction of 50-55%. The cavity size was normal. There is moderately  increased left ventricular wall thickness. Left ventricular diastolic  Doppler parameters are consistent with  impaired relaxation.   3. The right ventricle has normal systolic function. The cavity was  normal. There is no increase in right ventricular wall thickness.  FINDINGS   Left Ventricle: The left ventricle has low normal systolic function, with  an ejection fraction of 50-55%. The cavity size was normal. There is  moderately increased left ventricular wall thickness. Left ventricular  diastolic Doppler parameters are  consistent with impaired relaxation. Severe akinesis of the left  ventricular, mid-apical anteroseptal wall.   Right Ventricle: The right ventricle has normal systolic function. The  cavity was normal. There is no increase in right ventricular wall  thickness.   Left Atrium: Left atrial size was normal in size.   Right Atrium: Right atrial size was normal in size. Right atrial pressure  is estimated at 10 mmHg.   Interatrial Septum: No atrial level shunt detected by color flow Doppler.   Pericardium: There is no evidence of pericardial effusion.   Mitral Valve: The mitral valve is normal in structure. Mitral valve  regurgitation is trivial by color flow Doppler.   Tricuspid Valve: The tricuspid valve is normal in structure. Tricuspid  valve regurgitation was  not visualized by color flow Doppler.   Aortic Valve: The aortic valve is normal in structure. Aortic  valve  regurgitation was not visualized by color flow Doppler.   Pulmonic Valve: The pulmonic valve was normal in structure. Pulmonic valve  regurgitation is not visualized by color flow Doppler.   Venous: The inferior vena cava is normal in size with greater than 50%  respiratory variability.    07/15/17: TTE Study Conclusions - Left ventricle: Systolic function was normal. The   estimated ejection fraction was in the range of 55% to   60%. Wall motion was normal; there were no regional wall   motion abnormalities. - Aortic valve: No evidence of vegetation. - Mitral valve: No evidence of vegetation. Mild   regurgitation. - Left atrium: The atrium was mildly dilated. No evidence of   thrombus in the atrial cavity or appendage. - Atrial septum: No defect or patent foramen ovale was   identified. There was redundancy of the septum, with   borderline criteria for aneurysm. - Tricuspid valve: No evidence of vegetation.   Recent Labs: 05/05/2023: ALT 11; BUN 14; Creat 0.85; Hemoglobin 15.0; Platelets 181; Potassium 4.3; Sodium 141; TSH 1.82  05/05/2023: Cholesterol 122; HDL 48; LDL Cholesterol (Calc) 54; Total CHOL/HDL Ratio 2.5; Triglycerides 117   CrCl cannot be calculated (Patient's most recent lab result is older than the maximum 21 days allowed.).   Wt Readings from Last 3 Encounters:  06/12/23 142 lb 4 oz (64.5 kg)  06/02/23 138 lb 9.6 oz (62.9 kg)  05/05/23 140 lb (63.5 kg)     Other studies reviewed: Additional studies/records reviewed today include: summarized above  ASSESSMENT AND PLAN:  1. Persistent AFib, Aflutter     S/p CTI and PVI ablation 2015     CHA2DS2Vasc is 4 , on Eliquis, appropriately dosed  2. CHF ?HFpEF She has some ankle edema (usually wearing support stockings) She says this wakes/wanes Advised continue support stocking and elevation when  seated  3. HTN     looks ok  4. Bradycardia 5. New RBBB No clear symptoms of bradycardia   6. Secondary hypercoagulable state      Disposition: back in 4 mo, sooner if needed    Current medicines are reviewed at length with the patient today.  The patient did not have any concerns regarding medicines.  Norma Fredrickson, PA-C 08/03/2023 10:51 AM     [facility] [address] [place] [address] [phone] (office)  [phone] (fax)

## 2023-08-04 ENCOUNTER — Ambulatory Visit: Payer: PPO | Attending: Physician Assistant | Admitting: Physician Assistant

## 2023-08-04 ENCOUNTER — Encounter: Payer: Self-pay | Admitting: Physician Assistant

## 2023-08-04 VITALS — BP 140/70 | HR 62 | Ht 65.0 in | Wt 138.0 lb

## 2023-08-04 DIAGNOSIS — I10 Essential (primary) hypertension: Secondary | ICD-10-CM | POA: Diagnosis not present

## 2023-08-04 DIAGNOSIS — I4510 Unspecified right bundle-branch block: Secondary | ICD-10-CM | POA: Diagnosis not present

## 2023-08-04 DIAGNOSIS — R001 Bradycardia, unspecified: Secondary | ICD-10-CM | POA: Diagnosis not present

## 2023-08-04 DIAGNOSIS — I4892 Unspecified atrial flutter: Secondary | ICD-10-CM | POA: Diagnosis not present

## 2023-08-04 DIAGNOSIS — I4819 Other persistent atrial fibrillation: Secondary | ICD-10-CM | POA: Diagnosis not present

## 2023-08-04 NOTE — Telephone Encounter (Signed)
Requested medication (s) are due for refill today:     Requested medication (s) are on the active medication list:     Future visit scheduled:      Last ordered: Forwarding to [employer]   Requested Prescriptions  Pending Prescriptions Disp Refills   lisinopril (ZESTRIL) 40 MG tablet [Pharmacy Med Name: LISINOPRIL 40MG  TABLETS] 90 tablet 3    Sig: TAKE 1 TABLET(40 MG) BY MOUTH DAILY     Cardiovascular:  ACE Inhibitors Failed - 08/04/2023  3:34 PM      Failed - Last BP in normal range    BP Readings from Last 1 Encounters:  08/04/23 (!) 140/70         Failed - Valid encounter within last 6 months    Recent Outpatient Visits           1 year ago Erroneous encounter - disregard   [facility] Donita Brooks, MD   1 year ago Controlled type 2 diabetes mellitus with complication, without long-term current use of insulin (HCC)   [facility] Pickard, Priscille Heidelberg, MD   1 year ago Dysuria   [facility] Pickard, Priscille Heidelberg, MD   1 year ago Dysuria   [facility] Tanya Nones, Priscille Heidelberg, MD   1 year ago Chronic fatigue   [facility] Donita Brooks, MD       Future Appointments             In 4 months Sheilah Pigeon, PA-C [facility]            Passed - Cr in normal range and within 180 days    Creat  Date Value Ref Range Status  05/05/2023 0.85 0.60 - 1.00 mg/dL Final   Creatinine, Urine  Date Value Ref Range Status  06/11/2022 71 20 - 275 mg/dL Final         Passed - K in normal range and within 180 days    Potassium  Date Value Ref Range Status  05/05/2023 4.3 3.5 - 5.3 mmol/L Final         Passed - Patient is not pregnant

## 2023-08-04 NOTE — Patient Instructions (Signed)
 Medication Instructions:    Your physician recommends that you continue on your current medications as directed. Please refer to the Current Medication list given to you today.   *If you need a refill on your cardiac medications before your next appointment, please call your pharmacy*   Lab Work: NONE ORDERED  TODAY    If you have labs (blood work) drawn today and your tests are completely normal, you will receive your results only by: MyChart Message (if you have MyChart) OR A paper copy in the mail If you have any lab test that is abnormal or we need to change your treatment, we will call you to review the results.   Testing/Procedures: NONE ORDERED  TODAY   Follow-Up: At [facility], you and your health needs are our priority.  As part of our continuing mission to provide you with exceptional heart care, we have created designated Provider Care Teams.  These Care Teams include your primary Cardiologist (physician) and Advanced Practice Providers (APPs -  Physician Assistants and Nurse Practitioners) who all work together to provide you with the care you need, when you need it.  We recommend signing up for the patient portal called "MyChart".  Sign up information is provided on this After Visit Summary.  MyChart is used to connect with patients for Virtual Visits (Telemedicine).  Patients are able to view lab/test results, encounter notes, upcoming appointments, etc.  Non-urgent messages can be sent to your provider as well.   To learn more about what you can do with MyChart, go to [email or web address].    Your next appointment:   4 month(s)  Provider:   You may see Lanier Prude, MD or one of the following Advanced Practice Providers on your designated Care Team:   Francis Dowse, [place]    Other Instructions

## 2023-08-04 NOTE — Telephone Encounter (Signed)
Requested medication (s) are due for refill today:   Not sure    The note is too small for me to read  Requested medication (s) are on the active medication list:     Future visit scheduled:      Last ordered: Came to [facility].     E2C2 to refill    Forwarding to E2C2   Requested Prescriptions  Pending Prescriptions Disp Refills   lisinopril (ZESTRIL) 40 MG tablet [Pharmacy Med Name: LISINOPRIL 40MG  TABLETS] 90 tablet 3    Sig: TAKE 1 TABLET(40 MG) BY MOUTH DAILY     Cardiovascular:  ACE Inhibitors Failed - 08/04/2023  3:28 PM      Failed - Last BP in normal range    BP Readings from Last 1 Encounters:  08/04/23 (!) 140/70         Failed - Valid encounter within last 6 months    Recent Outpatient Visits           1 year ago Erroneous encounter - disregard   [facility] Donita Brooks, MD   1 year ago Controlled type 2 diabetes mellitus with complication, without long-term current use of insulin (HCC)   [facility] Pickard, Priscille Heidelberg, MD   1 year ago Dysuria   [facility] Pickard, Priscille Heidelberg, MD   1 year ago Dysuria   [facility] Tanya Nones, Priscille Heidelberg, MD   1 year ago Chronic fatigue   [facility] Donita Brooks, MD       Future Appointments             In 4 months Sheilah Pigeon, PA-C [place] [facility]            Passed - Cr in normal range and within 180 days    Creat  Date Value Ref Range Status  05/05/2023 0.85 0.60 - 1.00 mg/dL Final   Creatinine, Urine  Date Value Ref Range Status  06/11/2022 71 20 - 275 mg/dL Final         Passed - K in normal range and within 180 days    Potassium  Date Value Ref Range Status  05/05/2023 4.3 3.5 - 5.3 mmol/L Final         Passed - Patient is not pregnant

## 2023-08-05 ENCOUNTER — Telehealth: Payer: Self-pay

## 2023-08-05 ENCOUNTER — Other Ambulatory Visit: Payer: Self-pay | Admitting: Family Medicine

## 2023-08-05 ENCOUNTER — Other Ambulatory Visit: Payer: PPO

## 2023-08-05 DIAGNOSIS — R300 Dysuria: Secondary | ICD-10-CM | POA: Diagnosis not present

## 2023-08-05 MED ORDER — NITROFURANTOIN MONOHYD MACRO 100 MG PO CAPS: 100.0000 mg | ORAL_CAPSULE | Freq: Two times a day (BID) | ORAL | 0 refills | Status: DC

## 2023-08-05 NOTE — Telephone Encounter (Signed)
Pt came by office with a urine sample due to burning with urination since last night. Orders placed for UA/UC. Pt asks if something can be sent in to treat? Thanks.

## 2023-08-05 NOTE — Telephone Encounter (Signed)
 Copied from CRM [id number]. Topic: Clinical - Medical Advice >> Aug 05, 2023  2:59 PM Graeme ORN wrote: Reason for CRM: Patient called wanted to see if provider had sent in anything for UTI. Per chart lab result for urine culture not yet finalized. Advised patient that nothing had been sent as of now. Patient would like to be updated if possible. Thank you  Spoke w/pt and is aware Rx sent to pharmacy

## 2023-08-06 LAB — URINE CULTURE
MICRO NUMBER:: 16038808
Result:: NO GROWTH
SPECIMEN QUALITY:: ADEQUATE

## 2023-08-06 LAB — URINALYSIS, ROUTINE W REFLEX MICROSCOPIC
Bacteria, UA: NONE SEEN /[HPF]
Bilirubin Urine: NEGATIVE
Hyaline Cast: NONE SEEN /[LPF]
Ketones, ur: NEGATIVE
Leukocytes,Ua: NEGATIVE
Nitrite: NEGATIVE
Protein, ur: NEGATIVE
Specific Gravity, Urine: 1.01 (ref 1.001–1.035)
Squamous Epithelial / HPF: NONE SEEN /[HPF] (ref <=5)
pH: 5.5 (ref 5.0–8.0)

## 2023-08-06 LAB — MICROSCOPIC MESSAGE

## 2023-08-07 ENCOUNTER — Encounter (HOSPITAL_COMMUNITY): Payer: PPO

## 2023-08-08 ENCOUNTER — Ambulatory Visit (INDEPENDENT_AMBULATORY_CARE_PROVIDER_SITE_OTHER): Payer: PPO | Admitting: Family Medicine

## 2023-08-08 ENCOUNTER — Encounter: Payer: Self-pay | Admitting: Family Medicine

## 2023-08-08 VITALS — BP 120/72 | HR 63 | Temp 98.0°F | Ht 65.0 in | Wt 137.8 lb

## 2023-08-08 DIAGNOSIS — R319 Hematuria, unspecified: Secondary | ICD-10-CM | POA: Diagnosis not present

## 2023-08-08 MED ORDER — ESTRADIOL 0.1 MG/GM VA CREA: 1.0000 | TOPICAL_CREAM | Freq: Every day | VAGINAL | 12 refills | Status: AC

## 2023-08-08 NOTE — Progress Notes (Signed)
 Subjective:    Patient ID: Erika Lee, female    DOB: Mar 24, 1946, 78 y.o.   MRN: [id number]  Patient recently called complaining that she had a urinary tract infection.  However urinalysis and culture do not support the diagnosis of a urinary tract infection.  Please see below Lab on 08/05/2023  Component Date Value Ref Range Status   Color, Urine 08/05/2023 YELLOW  YELLOW Final   APPearance 08/05/2023 CLOUDY (A)  CLEAR Final   Specific Gravity, Urine 08/05/2023 1.010  1.001 - 1.035 Final   pH 08/05/2023 5.5  5.0 - 8.0 Final   Glucose, UA 08/05/2023 2+ (A)  NEGATIVE Final   Bilirubin Urine 08/05/2023 NEGATIVE  NEGATIVE Final   Ketones, ur 08/05/2023 NEGATIVE  NEGATIVE Final   Hgb urine dipstick 08/05/2023 3+ (A)  NEGATIVE Final   Protein, ur 08/05/2023 NEGATIVE  NEGATIVE Final   Nitrite 08/05/2023 NEGATIVE  NEGATIVE Final   Leukocytes,Ua 08/05/2023 NEGATIVE  NEGATIVE Final   WBC, UA 08/05/2023 0-5  0 - 5 /HPF Final   RBC / HPF 08/05/2023 0-2  0 - 2 /HPF Final   Squamous Epithelial / HPF 08/05/2023 NONE SEEN  < OR = 5 /HPF Final   Bacteria, UA 08/05/2023 NONE SEEN  NONE SEEN /HPF Final   Hyaline Cast 08/05/2023 NONE SEEN  NONE SEEN /LPF Final   MICRO NUMBER: 08/05/2023 [id number]   Final   SPECIMEN QUALITY: 08/05/2023 Adequate   Final   Sample Source 08/05/2023 URINE   Final   STATUS: 08/05/2023 FINAL   Final   Result: 08/05/2023 No Growth   Final   Note 08/05/2023    Final   Comment: This urine was analyzed for the presence of WBC,  RBC, bacteria, casts, and other formed elements.  Only those elements seen were reported. . .    Patient did have microscopic hemoglobin.  She is on Jardiance  for congestive heart failure and prediabetes.  Recommended the patient be evaluated given the microscopic hemoglobin found on the urine sample with a negative urine culture.  Patient states that for 1 week, she has noticed that her urine is tinged red.  She states that sometimes it looks  like cranberry juice.  She has very mild occasional dysuria.  She denies any CVA tenderness.  She denies any fevers or chills.  She denies any pain radiating from her back to her pelvis.  She has no abdominal pain to palpation.  She denies any vaginal bleeding. Past Medical History:  Diagnosis Date   Anticoagulant long-term use    eliquis    Chronic sinusitis    GERD (gastroesophageal reflux disease)    Heart failure with preserved ejection fraction (HCC)    Hemorrhoids    History of colitis 10/2016   campylobacter   Hyperlipidemia    Hypertension    Multinodular goiter    per pt has had 2 biopsy's both benign   NSTEMI (non-ST elevated myocardial infarction) (HCC)    12/2018- normal coronary arteries on cath, poss coronary vasospasm.    Osteoarthritis    all over  and [facility] joint left shoulder   Paroxysmal atrial fibrillation [facility]) EP cardiologist--  dr kelsie   s/p PVI by Dr Kelsie 07/16/2013;   pt first dx 2008,  recurrence 2014 afib/flutter and tachy-brady   Rotator cuff tear, left    Shoulder impingement, left    Status post placement of implantable loop recorder    original placement 07-26-2014;  removal / replacement 10-17-2017  by dr allred   Type 2 diabetes mellitus (HCC)    followed by pcp   Past Surgical History:  Procedure Laterality Date   ATRIAL FIBRILLATION ABLATION N/A 07/16/2013   PVI and CTI ablation by Dr Kelsie   CARDIAC CATHETERIZATION     01/11/2019- Normal coronary arteries.    COLONOSCOPY  09/10/2011   Dr. Mavis: normal, 10 year follow up.   implantable loop recorder removal  04/25/2021   MDT LINQ removed by Dr Kelsie   LEFT HEART CATH AND CORONARY ANGIOGRAPHY N/A 01/11/2019   Procedure: LEFT HEART CATH AND CORONARY ANGIOGRAPHY;  Surgeon: Court Dorn PARAS, MD;  Location: [facility] INVASIVE CV LAB;  Service: Cardiovascular;  Laterality: N/A;   LESION REMOVAL N/A 05/03/2013   Procedure: EXCISION CYST, BACK;  Surgeon: Oneil DELENA Mavis, MD;  Location: [facility] ORS;   Service: General;  Laterality: N/A;   LOOP RECORDER IMPLANT N/A 07/26/2014   Procedure: LOOP RECORDER IMPLANT;  Surgeon: Lynwood Kelsie, MD;  Location: [facility] CATH LAB;  Service: Cardiovascular;  Laterality: N/A;   LOOP RECORDER INSERTION N/A 10/17/2017   MDT previously implanted ILR for afib management at [facility].  old device removed and new device placed by Dr Kelsie   LOOP RECORDER REMOVAL N/A 10/17/2017   MDT ILR removed with new device subsequently replaced   SHOULDER ARTHROSCOPY WITH ROTATOR CUFF REPAIR AND SUBACROMIAL DECOMPRESSION Left 01/07/2019   Procedure: Left shoulder subacromial decompression, distal clavicle resection, extensive debridement,;  Surgeon: Sharl Selinda Dover, MD;  Location: [facility];  Service: Orthopedics;  Laterality: Left;   TEE WITHOUT CARDIOVERSION N/A 07/15/2013   Procedure: TRANSESOPHAGEAL ECHOCARDIOGRAM (TEE);  Surgeon: Redell GORMAN Shallow, MD;  Location: [facility] ENDOSCOPY;  Service: Cardiovascular;  Laterality: N/A;   TONSILLECTOMY  age 14   TRANSANAL HEMORRHOIDAL DEARTERIALIZATION N/A 09/22/2015   Procedure: TRANSANAL HEMORRHOIDAL LIGATION/PEXY EUA POSSIBLE HEMORRHOIDECTOMY ;  Surgeon: Elspeth Schultze, MD;  Location: [facility] ORS;  Service: General;  Laterality: N/A;   Current Outpatient Medications on File Prior to Visit  Medication Sig Dispense Refill   apixaban  (ELIQUIS ) 5 MG TABS tablet Take 1 tablet (5 mg total) by mouth 2 (two) times daily. 60 tablet 3   diazepam  (VALIUM ) 5 MG tablet TAKE 1 TABLET(5 MG) BY MOUTH AT BEDTIME AS NEEDED FOR SLEEP 30 tablet 3   empagliflozin  (JARDIANCE ) 10 MG TABS tablet Take 1 tablet (10 mg total) by mouth daily. TAKE 1 TABLET(10 MG) BY MOUTH DAILY 90 tablet 3   glucose blood (FREESTYLE PRECISION NEO TEST) test strip DX: E11.9 Use to check blood sugar daily 100 strip 5   HYDROcodone -acetaminophen  (NORCO) 10-325 MG tablet Take 1 tablet by mouth every 6 (six) hours as needed. 120 tablet 0   Lancets (FREESTYLE) lancets Use to check  blood sugar daily. 100 each 5   lisinopril  (ZESTRIL ) 40 MG tablet TAKE 1 TABLET(40 MG) BY MOUTH DAILY 90 tablet 3   nitrofurantoin , macrocrystal-monohydrate, (MACROBID ) 100 MG capsule Take 1 capsule (100 mg total) by mouth 2 (two) times daily. 14 capsule 0   ondansetron  (ZOFRAN ) 4 MG tablet Take 1 tablet (4 mg total) by mouth every 8 (eight) hours as needed for nausea or vomiting. 20 tablet 0   promethazine  (PHENERGAN ) 12.5 MG tablet TAKE 1 TABLET(12.5 MG) BY MOUTH EVERY 6 HOURS AS NEEDED FOR NAUSEA OR VOMITING 30 tablet 3   simvastatin  (ZOCOR ) 40 MG tablet Take 1 tablet (40 mg total) by mouth daily at 6 PM. 90 tablet 2   spironolactone  (ALDACTONE ) 25 MG  tablet Take 0.5 tablets (12.5 mg total) by mouth daily. 90 tablet 3   No current facility-administered medications on file prior to visit.     Allergies  Allergen Reactions   Clindamycin /Lincomycin Rash   Doxycycline Other (See Comments)    Makes heart race   Keflex  [Cephalexin ] Nausea And Vomiting   Other    Sulfonic Acid (3,5-Dibromo-4-H-Ox-Benz)    Adhesive [Tape] Rash   Latex Rash   Sulfonamide Derivatives Nausea And Vomiting   Social History   Socioeconomic History   Marital status: Married    Spouse name: Interior And Spatial Designer   Number of children: 2   Years of education: 12   Highest education level: 12th grade  Occupational History   Occupation: Advertising Copywriter: RETIRED    Comment: Full time  Tobacco Use   Smoking status: Former    Current packs/day: 0.00    Types: Cigarettes    Start date: 01/03/1960    Quit date: 01/02/1994    Years since quitting: 29.6    Passive exposure: Past   Smokeless tobacco: Never  Vaping Use   Vaping status: Never Used  Substance and Sexual Activity   Alcohol use: No   Drug use: No   Sexual activity: Not Currently    Partners: Male  Other Topics Concern   Not on file  Social History Narrative   Spouse is on hospice care   One child deceased from homicide at age 20     Social Drivers of Health   Financial Resource Strain: Low Risk  (05/21/2022)   Overall Financial Resource Strain (CARDIA)    Difficulty of Paying Living Expenses: Not hard at all  Food Insecurity: No Food Insecurity (05/21/2022)   Hunger Vital Sign    Worried About Running Out of Food in the Last Year: Never true    Ran Out of Food in the Last Year: Never true  Transportation Needs: No Transportation Needs (05/21/2022)   PRAPARE - Administrator, Civil Service (Medical): No    Lack of Transportation (Non-Medical): No  Physical Activity: Inactive (05/21/2022)   Exercise Vital Sign    Days of Exercise per Week: 0 days    Minutes of Exercise per Session: 0 min  Stress: Stress Concern Present (05/21/2022)   [employer] of Occupational Health - Occupational Stress Questionnaire    Feeling of Stress : To some extent  Social Connections: Moderately Integrated (05/21/2022)   Social Connection and Isolation Panel [NHANES]    Frequency of Communication with Friends and Family: More than three times a week    Frequency of Social Gatherings with Friends and Family: Once a week    Attends Religious Services: 1 to 4 times per year    Active Member of [employer] or Organizations: No    Attends Banker Meetings: Not on file    Marital Status: Married  Catering Manager Violence: Not At Risk (05/21/2022)   Humiliation, Afraid, Rape, and Kick questionnaire    Fear of Current or Ex-Partner: No    Emotionally Abused: No    Physically Abused: No    Sexually Abused: No     Review of Systems  All other systems reviewed and are negative.      Objective:   Physical Exam Vitals reviewed. Exam conducted with a chaperone present.  Constitutional:      General: She is not in acute distress.    Appearance: Normal appearance. She is not ill-appearing or toxic-appearing.  Cardiovascular:     Rate and Rhythm: Normal rate and regular rhythm.     Pulses: Normal pulses.      Heart sounds: Normal heart sounds. No murmur heard.    No friction rub. No gallop.  Pulmonary:     Effort: Pulmonary effort is normal. No respiratory distress.     Breath sounds: No stridor. No wheezing, rhonchi or rales.  Abdominal:     General: Abdomen is flat. Bowel sounds are normal. There is no distension.     Palpations: Abdomen is soft.     Tenderness: There is no abdominal tenderness. There is no guarding.  Genitourinary:    General: Normal vulva.     Exam position: Lithotomy position.     Pubic Area: No rash.      Labia:        Right: No rash or lesion.        Left: No rash or lesion.      Urethra: No prolapse, urethral swelling or urethral lesion.     Vagina: Normal.     Cervix: Normal. No cervical motion tenderness, friability or erythema.    Musculoskeletal:     Cervical back: Neck supple.  Neurological:     General: No focal deficit present.     Mental Status: She is alert and oriented to person, place, and time.     Cranial Nerves: No cranial nerve deficit.     Sensory: No sensory deficit.     Motor: No weakness.     Coordination: Coordination normal.     Gait: Gait normal.     Deep Tendon Reflexes: Reflexes normal.  Psychiatric:        Mood and Affect: Mood normal.        Thought Content: Thought content normal.           Assessment & Plan:  Hematuria, unspecified type Patient has gross hematuria.  Urine culture is negative.  Pelvic exam reveals atrophic vaginitis with tenderness around the clitoral hood but no obvious abnormal lesion is seen on exam.  Therefore I have recommended urology consultation for cystoscopy.  Will start the patient on vaginal estrogen for atrophic vaginitis

## 2023-08-11 ENCOUNTER — Ambulatory Visit (HOSPITAL_COMMUNITY): Admit: 2023-08-11 | Payer: PPO | Admitting: Ophthalmology

## 2023-08-11 SURGERY — CATARACT EXTRACTION PHACO AND INTRAOCULAR LENS PLACEMENT (IOC)
Anesthesia: Monitor Anesthesia Care | Laterality: Left

## 2023-08-14 ENCOUNTER — Ambulatory Visit (HOSPITAL_COMMUNITY)
Admission: RE | Admit: 2023-08-14 | Discharge: 2023-08-14 | Disposition: A | Discharge status: Home / Self Care | Payer: HMO | Source: Ambulatory Visit | Attending: Family Medicine | Admitting: Family Medicine

## 2023-08-14 DIAGNOSIS — N134 Hydroureter: Secondary | ICD-10-CM | POA: Diagnosis not present

## 2023-08-14 DIAGNOSIS — R319 Hematuria, unspecified: Secondary | ICD-10-CM | POA: Insufficient documentation

## 2023-08-14 DIAGNOSIS — N1330 Unspecified hydronephrosis: Secondary | ICD-10-CM | POA: Diagnosis not present

## 2023-08-18 ENCOUNTER — Other Ambulatory Visit: Payer: Self-pay | Admitting: Family Medicine

## 2023-08-18 NOTE — Telephone Encounter (Signed)
Copied from CRM [id number]. Topic: Clinical - Medication Refill >> Aug 18, 2023 11:06 AM Eunice Blase wrote: Most Recent Primary Care Visit:  Provider: Lynnea Ferrier T  Department: [facility]  Visit Type: OFFICE VISIT  Date: 08/08/2023  Medication: lisinopril (ZESTRIL) 40 MG tablet and   Has the patient contacted their pharmacy? Yes (Agent: If no, request that the patient contact the pharmacy for the refill. If patient does not wish to contact the pharmacy document the reason why and proceed with request.) (Agent: If yes, when and what did the pharmacy advise?) Pharmacy needs refills  Is this the correct pharmacy for this prescription? Yes If no, delete pharmacy and type the correct one.  This is the patient's preferred pharmacy:  [facility] - [place], [place] - [address] [place] [address] Phone: [phone] Fax: [phone]     Has the prescription been filled recently? Yes  Is the patient out of the medication? Yes  Has the patient been seen for an appointment in the last year OR does the patient have an upcoming appointment? Yes  Can we respond through MyChart? Yes  Agent: Please be advised that Rx refills may take up to 3 business days. We ask that you follow-up with your pharmacy.

## 2023-08-27 ENCOUNTER — Telehealth: Payer: Self-pay

## 2023-08-27 NOTE — Telephone Encounter (Signed)
 Pt called to advise that she is still having a burning sensation in her private area. Pt states her husband passed away and is unable to come to the office. Pt asks if there is a cream that can be sent in for her? Thank you.

## 2023-09-01 ENCOUNTER — Other Ambulatory Visit: Payer: Self-pay | Admitting: Family Medicine

## 2023-09-01 NOTE — Telephone Encounter (Signed)
 Copied from CRM [id number]. Topic: Clinical - Medication Refill >> Sep 01, 2023 12:09 PM Shelah Lewandowsky wrote: Most Recent Primary Care Visit:  Provider: Lynnea Ferrier T  Department: [facility]  Visit Type: OFFICE VISIT  Date: 08/08/2023  Medication: HYDROcodone-acetaminophen (NORCO) 10-325 MG tablet  Has the patient contacted their pharmacy? No (Agent: If no, request that the patient contact the pharmacy for the refill. If patient does not wish to contact the pharmacy document the reason why and proceed with request.) (Agent: If yes, when and what did the pharmacy advise?)  Is this the correct pharmacy for this prescription? Yes If no, delete pharmacy and type the correct one.  This is the patient's preferred pharmacy:  [facility] - [place], [place] - [address] [place] [address] Phone: [phone] Fax: [phone]   Has the prescription been filled recently? Yes  Is the patient out of the medication? No  Has the patient been seen for an appointment in the last year OR does the patient have an upcoming appointment? Yes  Can we respond through MyChart? No  Agent: Please be advised that Rx refills may take up to 3 business days. We ask that you follow-up with your pharmacy.

## 2023-09-02 ENCOUNTER — Other Ambulatory Visit: Payer: Self-pay

## 2023-09-02 DIAGNOSIS — R319 Hematuria, unspecified: Secondary | ICD-10-CM

## 2023-09-02 DIAGNOSIS — M549 Dorsalgia, unspecified: Secondary | ICD-10-CM

## 2023-09-02 MED ORDER — HYDROCODONE-ACETAMINOPHEN 10-325 MG PO TABS: 1.0000 | ORAL_TABLET | Freq: Four times a day (QID) | ORAL | 0 refills | Status: DC | PRN

## 2023-09-03 ENCOUNTER — Encounter: Payer: Self-pay | Admitting: Urology

## 2023-09-03 ENCOUNTER — Ambulatory Visit: Payer: HMO | Admitting: Urology

## 2023-09-03 ENCOUNTER — Encounter: Payer: Self-pay | Admitting: Family Medicine

## 2023-09-03 ENCOUNTER — Ambulatory Visit: Admitting: Family Medicine

## 2023-09-03 VITALS — BP 112/60 | HR 78 | Temp 98.2°F | Ht 65.0 in | Wt 137.1 lb

## 2023-09-03 VITALS — BP 164/73 | HR 45 | Temp 97.5°F

## 2023-09-03 DIAGNOSIS — N952 Postmenopausal atrophic vaginitis: Secondary | ICD-10-CM

## 2023-09-03 DIAGNOSIS — J209 Acute bronchitis, unspecified: Secondary | ICD-10-CM

## 2023-09-03 DIAGNOSIS — N134 Hydroureter: Secondary | ICD-10-CM

## 2023-09-03 DIAGNOSIS — R310 Gross hematuria: Secondary | ICD-10-CM | POA: Diagnosis not present

## 2023-09-03 DIAGNOSIS — N1330 Unspecified hydronephrosis: Secondary | ICD-10-CM

## 2023-09-03 DIAGNOSIS — E119 Type 2 diabetes mellitus without complications: Secondary | ICD-10-CM | POA: Diagnosis not present

## 2023-09-03 DIAGNOSIS — R634 Abnormal weight loss: Secondary | ICD-10-CM

## 2023-09-03 DIAGNOSIS — Z87891 Personal history of nicotine dependence: Secondary | ICD-10-CM

## 2023-09-03 LAB — URINALYSIS, ROUTINE W REFLEX MICROSCOPIC
Bilirubin, UA: NEGATIVE
Ketones, UA: NEGATIVE
Nitrite, UA: NEGATIVE
Protein,UA: NEGATIVE
Specific Gravity, UA: 1.01 (ref 1.005–1.030)
Urobilinogen, Ur: 0.2 mg/dL (ref 0.2–1.0)
pH, UA: 6 (ref 5.0–7.5)

## 2023-09-03 LAB — MICROSCOPIC EXAMINATION

## 2023-09-03 LAB — BLADDER SCAN AMB NON-IMAGING: Scan Result: 241

## 2023-09-03 MED ORDER — AMOXICILLIN 875 MG PO TABS: 875.0000 mg | ORAL_TABLET | Freq: Two times a day (BID) | ORAL | 0 refills | Status: AC

## 2023-09-03 MED ORDER — AZELASTINE HCL 0.1 % NA SOLN: 1.0000 | Freq: Two times a day (BID) | NASAL | 1 refills | Status: AC

## 2023-09-03 MED ORDER — BENZONATATE 100 MG PO CAPS: 100.0000 mg | ORAL_CAPSULE | Freq: Three times a day (TID) | ORAL | 0 refills | Status: DC | PRN

## 2023-09-03 NOTE — Progress Notes (Signed)
 Name: Erika Lee DOB: [birth year] MRN: [id number]  History of Present Illness: Ms. Oki is a 78 y.o. female who presents today as a new patient at [facility].   She reports chief complaint of gross hematuria.  > 05/05/2023: Normal renal function (creatinine 0.85, GFR 70).  > 08/05/2023:  - UA positive for 3+ blood, however urine microscopy was negative (0-2 RBC/hpf).  - Was prescribed Macrobid for possible UTI.  - Negative urine culture.  > 08/08/2023: Seen by PCP. Per note:  - "Patient states that for 1 week, she has noticed that her urine is tinged red. She states that sometimes it looks like cranberry juice. She has very mild occasional dysuria."  - "Pelvic exam reveals atrophic vaginitis with tenderness around the clitoral hood but no obvious abnormal lesion is seen on exam." - Started on topical vaginal estrogen cream for vaginal atrophy.  > 08/14/2023: CT stone study showed right-sided hydronephrosis and hydroureter. No GU stones. Bladder unremarkable.  Today: She denies dysuria, gross hematuria, hesitancy, straining to void, or sensations of incomplete emptying. She denies flank pain or abdominal pain. Reports baseline urinary urgency and occasional urge incontinence; wears pads.   She reports vaginal redness, burning sensation, and itching. Denies bleeding, abnormal discharge, dryness. She states that she started using topical vaginal estrogen cream when it was first prescribed however sadly her husband passed away recently and she hasn't been using the cream in the last few weeks.  Reports unintentional weight loss over the past 18 months. She attributes that to the stress from taking care of her husband.   She denies prior history of gross hematuria.  She denies history of kidney stones.  She denies history of pyelonephritis.  She denies history of recent or recurrent UTI. She denies history of GU malignancy or pelvic radiation.  She denies history of  autoimmune disease. She reports history of smoking (quit 1995; 34 pack year history). She reports taking anticoagulants (Eliquis).  Medications: Current Outpatient Medications  Medication Sig Dispense Refill   apixaban (ELIQUIS) 5 MG TABS tablet Take 1 tablet (5 mg total) by mouth 2 (two) times daily. 60 tablet 3   diazepam (VALIUM) 5 MG tablet TAKE 1 TABLET(5 MG) BY MOUTH AT BEDTIME AS NEEDED FOR SLEEP 30 tablet 3   empagliflozin (JARDIANCE) 10 MG TABS tablet Take 1 tablet (10 mg total) by mouth daily. TAKE 1 TABLET(10 MG) BY MOUTH DAILY 90 tablet 3   estradiol (ESTRACE) 0.1 MG/GM vaginal cream Place 1 Applicatorful vaginally at bedtime. 42.5 g 12   glucose blood (FREESTYLE PRECISION NEO TEST) test strip DX: E11.9 Use to check blood sugar daily 100 strip 5   HYDROcodone-acetaminophen (NORCO) 10-325 MG tablet Take 1 tablet by mouth every 6 (six) hours as needed. 120 tablet 0   Lancets (FREESTYLE) lancets Use to check blood sugar daily. 100 each 5   lisinopril (ZESTRIL) 40 MG tablet TAKE 1 TABLET(40 MG) BY MOUTH DAILY 90 tablet 3   ondansetron (ZOFRAN) 4 MG tablet Take 1 tablet (4 mg total) by mouth every 8 (eight) hours as needed for nausea or vomiting. 20 tablet 0   promethazine (PHENERGAN) 12.5 MG tablet TAKE 1 TABLET(12.5 MG) BY MOUTH EVERY 6 HOURS AS NEEDED FOR NAUSEA OR VOMITING 30 tablet 3   simvastatin (ZOCOR) 40 MG tablet Take 1 tablet (40 mg total) by mouth daily at 6 PM. 90 tablet 2   spironolactone (ALDACTONE) 25 MG tablet Take 0.5 tablets (12.5 mg total) by mouth  daily. 90 tablet 3   No current facility-administered medications for this visit.    Allergies: Allergies  Allergen Reactions   Clindamycin/Lincomycin Rash   Doxycycline Other (See Comments)    Makes heart race   Keflex [Cephalexin] Nausea And Vomiting   Other    Sulfonic Acid (3,5-Dibromo-4-H-Ox-Benz)    Adhesive [Tape] Rash   Latex Rash   Sulfonamide Derivatives Nausea And Vomiting    Past Medical History:   Diagnosis Date   Anticoagulant long-term use    eliquis   Chronic sinusitis    GERD (gastroesophageal reflux disease)    Heart failure with preserved ejection fraction (HCC)    Hemorrhoids    History of colitis 10/2016   campylobacter   Hyperlipidemia    Hypertension    Multinodular goiter    per pt has had 2 biopsy's both benign   NSTEMI (non-ST elevated myocardial infarction) (HCC)    12/2018- normal coronary arteries on cath, poss coronary vasospasm.    Osteoarthritis    "all over"  and [facility] joint left shoulder   Paroxysmal atrial fibrillation [facility]) EP cardiologist--  dr Johney Frame   s/p PVI by Dr Johney Frame 07/16/2013;   pt first dx 2008,  recurrence 2014 afib/flutter and tachy-brady   Rotator cuff tear, left    Shoulder impingement, left    Status post placement of implantable loop recorder    original placement 07-26-2014;  removal / replacement 10-17-2017  by dr allred   Type 2 diabetes mellitus (HCC)    followed by pcp   Past Surgical History:  Procedure Laterality Date   ATRIAL FIBRILLATION ABLATION N/A 07/16/2013   PVI and CTI ablation by Dr Johney Frame   CARDIAC CATHETERIZATION     01/11/2019- Normal coronary arteries.    COLONOSCOPY  09/10/2011   Dr. Lovell Sheehan: normal, 10 year follow up.   implantable loop recorder removal  04/25/2021   MDT LINQ removed by Dr Johney Frame   LEFT HEART CATH AND CORONARY ANGIOGRAPHY N/A 01/11/2019   Procedure: LEFT HEART CATH AND CORONARY ANGIOGRAPHY;  Surgeon: Runell Gess, MD;  Location: [facility] INVASIVE [facility];  Service: Cardiovascular;  Laterality: N/A;   LESION REMOVAL N/A 05/03/2013   Procedure: EXCISION CYST, BACK;  Surgeon: Dalia Heading, MD;  Location: [facility];  Service: General;  Laterality: N/A;   LOOP RECORDER IMPLANT N/A 07/26/2014   Procedure: LOOP RECORDER IMPLANT;  Surgeon: Hillis Range, MD;  Location: [facility] CATH LAB;  Service: Cardiovascular;  Laterality: N/A;   LOOP RECORDER INSERTION N/A 10/17/2017   MDT previously implanted ILR for afib  management at [facility].  old device removed and new device placed by Dr Johney Frame   LOOP RECORDER REMOVAL N/A 10/17/2017   MDT ILR removed with new device subsequently replaced   SHOULDER ARTHROSCOPY WITH ROTATOR CUFF REPAIR AND SUBACROMIAL DECOMPRESSION Left 01/07/2019   Procedure: Left shoulder subacromial decompression, distal clavicle resection, extensive debridement,;  Surgeon: Yolonda Kida, MD;  Location: [facility];  Service: Orthopedics;  Laterality: Left;   TEE WITHOUT CARDIOVERSION N/A 07/15/2013   Procedure: TRANSESOPHAGEAL ECHOCARDIOGRAM (TEE);  Surgeon: Lewayne Bunting, MD;  Location: [facility];  Service: Cardiovascular;  Laterality: N/A;   TONSILLECTOMY  age 25   TRANSANAL HEMORRHOIDAL DEARTERIALIZATION N/A 09/22/2015   Procedure: TRANSANAL HEMORRHOIDAL LIGATION/PEXY EUA POSSIBLE HEMORRHOIDECTOMY ;  Surgeon: Karie Soda, MD;  Location: [facility];  Service: General;  Laterality: N/A;   Family History  Problem Relation Age of Onset   Stroke Mother 14   Stroke  Maternal Grandmother        stroke   Other Maternal Grandfather        "heart dropsy" kidney issues   Kidney disease Maternal Grandfather    Colon cancer Neg Hx    Social History   Socioeconomic History   Marital status: Married    Spouse name: Interior and spatial designer   Number of children: 2   Years of education: 12   Highest education level: 12th grade  Occupational History   Occupation: Advertising copywriter: RETIRED    Comment: Full time  Tobacco Use   Smoking status: Former    Current packs/day: 0.00    Types: Cigarettes    Start date: 01/03/1960    Quit date: 01/02/1994    Years since quitting: 29.6    Passive exposure: Past   Smokeless tobacco: Never  Vaping Use   Vaping status: Never Used  Substance and Sexual Activity   Alcohol use: No   Drug use: No   Sexual activity: Not Currently    Partners: Male  Other Topics Concern   Not on file  Social History Narrative   Spouse is on  hospice care   One child deceased from homicide at age 61    Social Drivers of Health   Financial Resource Strain: Low Risk  (05/21/2022)   Overall Financial Resource Strain (CARDIA)    Difficulty of Paying Living Expenses: Not hard at all  Food Insecurity: No Food Insecurity (05/21/2022)   Hunger Vital Sign    Worried About Running Out of Food in the Last Year: Never true    Ran Out of Food in the Last Year: Never true  Transportation Needs: No Transportation Needs (05/21/2022)   PRAPARE - Administrator, Civil Service (Medical): No    Lack of Transportation (Non-Medical): No  Physical Activity: Inactive (05/21/2022)   Exercise Vital Sign    Days of Exercise per Week: 0 days    Minutes of Exercise per Session: 0 min  Stress: Stress Concern Present (05/21/2022)   [employer] of Occupational Health - Occupational Stress Questionnaire    Feeling of Stress : To some extent  Social Connections: Moderately Integrated (05/21/2022)   Social Connection and Isolation Panel [NHANES]    Frequency of Communication with Friends and Family: More than three times a week    Frequency of Social Gatherings with Friends and Family: Once a week    Attends Religious Services: 1 to 4 times per year    Active Member of [employer] or Organizations: No    Attends Engineer, structural: Not on file    Marital Status: Married  Catering manager Violence: Not At Risk (05/21/2022)   Humiliation, Afraid, Rape, and Kick questionnaire    Fear of Current or Ex-Partner: No    Emotionally Abused: No    Physically Abused: No    Sexually Abused: No    SUBJECTIVE  Review of Systems Constitutional: Patient denies any unintentional weight loss or change in strength lntegumentary: Patient denies any rashes or pruritus Cardiovascular: Patient denies chest pain or syncope Respiratory: Patient denies shortness of breath Gastrointestinal: Patient denies constipation or diarrhea Musculoskeletal:  Patient denies muscle cramps or weakness Neurologic: Patient denies convulsions or seizures Allergic/Immunologic: Patient denies recent allergic reaction(s) Hematologic/Lymphatic: Patient denies bleeding tendencies Endocrine: Patient denies heat/cold intolerance  GU: As per HPI.  OBJECTIVE Vitals:   09/03/23 1118  BP: (!) 164/73  Pulse: (!) 45  Temp: (!) 97.5 F (36.4  C)   There is no height or weight on file to calculate BMI.  Physical Examination Constitutional: No obvious distress; patient is non-toxic appearing  Cardiovascular: No visible lower extremity edema.  Respiratory: The patient does not have audible wheezing/stridor; respirations do not appear labored  Gastrointestinal: Abdomen non-distended Musculoskeletal: Normal ROM of UEs  Skin: No obvious rashes/open sores  Neurologic: CN 2-12 grossly intact Psychiatric: Answered questions appropriately with normal affect  Hematologic/Lymphatic/Immunologic: No obvious bruises or sites of spontaneous bleeding  Urine microscopy: 11-30 WBC/hpf, 0-2 RBC/hpf, moderate bacteria; has glucosuria (takes Jardiance)  ASSESSMENT Gross hematuria - Plan: Urinalysis, Routine w reflex microscopic, Cytology, urine, BLADDER SCAN AMB NON-IMAGING, CT HEMATURIA WORKUP, Basic metabolic panel, CBC  Hydronephrosis of right kidney - Plan: Urinalysis, Routine w reflex microscopic, Cytology, urine, BLADDER SCAN AMB NON-IMAGING, CT HEMATURIA WORKUP, Basic metabolic panel, CBC  Hydroureter on right - Plan: Urinalysis, Routine w reflex microscopic, Cytology, urine, BLADDER SCAN AMB NON-IMAGING, CT HEMATURIA WORKUP, Basic metabolic panel, CBC  Atrophic vaginitis - Plan: Urinalysis, Routine w reflex microscopic, Cytology, urine, BLADDER SCAN AMB NON-IMAGING, CT HEMATURIA WORKUP, Basic metabolic panel, CBC  Former smoker - Plan: CT HEMATURIA WORKUP, Basic metabolic panel, CBC  Unintentional weight loss  We discussed possible etiologies for her gross  hematuria and right hydroureteronephrosis including but not limited to: vigorous exercise, sexual activity, stone, trauma, blood thinner use, urinary tract infection, kidney function, malignancy. We discussed pt's nicotine use as a risk factor for GU cancer and encouraged continued cessation.  We discussed the importance of work-up including assessing the upper and lower GU tract with CT urogram and cystoscopy. We will also check voided cytology.  She was advised that her vaginal irritation is likely due to her vaginal atrophy. We discussed the following: - The etiology and consequences of urogenital epithelial atrophy was explained to patient. The thinning of the epithelium of the urethra can contribute to urinary urgency and frequency syndromes. In addition, the normal bacterial flora that colonizes the perineum may contribute to UTI risk because the thin urethral epithelium allows the bacteria to become adherent and the change in vaginal pH can disrupt the vaginal / urethral microbiome and allow for bacterial overgrowth. - The normal bacterial flora that colonizes the perineum may contribute to UTI risk because the thin urethral epithelium allows the bacteria to become adherent and the change in vaginal pH can disrupt the vaginal / urethral microbiome and allow for bacterial overgrowth. - Topical vaginal estrogen replacement will take about 3 months to restore the vaginal pH. - Topical vaginal replacement may sting/burn initially due to severe dryness, which will improve with ongoing treatment as the tissue gets healthier.   Can use Vaseline PRN during the day for comfort.  Advised to see a GYN provider for further evaluation if vaginal irritation persists after 6-12 weeks.  Pt verbalized understanding and decided to pursue this work-up. Patient agreed to follow-up afterward to discuss the results and formulate a treatment plan based on the findings. All questions were answered.   PLAN Advised  the following: CT hematuria ordered. Voided cytology ordered. BMP & CBC.  Use topical vaginal estrogen cream as directed. Return for 1st available cystoscopy with any urology MD.  Orders Placed This Encounter  Procedures   CT HEMATURIA WORKUP    Standing Status:   Future    Expiration Date:   09/02/2024    Reason for Exam (SYMPTOM  OR DIAGNOSIS REQUIRED):   Gross hematuria    Preferred imaging location?:  [facility]   Urinalysis, Routine w reflex microscopic   Basic metabolic panel   CBC   BLADDER SCAN AMB NON-IMAGING   Total time spent caring for the patient today was over 45 minutes. This includes time spent on the date of the visit reviewing the patient's chart before the visit, time spent during the visit, and time spent after the visit on documentation. Over 50% of that time was spent in face-to-face time with this patient for direct counseling. E&M based on time and complexity of medical decision making.  It has been explained that the patient is to follow regularly with their PCP in addition to all other providers involved in their care and to follow instructions provided by these respective offices. Patient advised to contact urology clinic if any urologic-pertaining questions, concerns, new symptoms or problems arise in the interim period.  Patient Instructions  Vaginal atrophy I Genitourinary syndrome of menopause (GSM):  What it is: Changes in the vaginal environment (including the vulva and urethra) including: Thinning of the epithelium (skin/ mucosa surface) Can contribute to urinary urgency and frequency Can contribute to dryness, itching, irritation of the vulvar and vaginal tissue Can contribute to pain with intercourse Can contribute to physical changes of the labia, vulva, and vagina such as: Narrowing of the vaginal opening Decreased vaginal length Loss of labial architecture Labial adhesions Pale color of vulvovaginal tissue  Loss of pubic  hair Allows bacteria to become adherent  Results in increased risk for urinary tract infection (UTI) due to bacterial overgrowth and migration up the urethra into the bladder Change in vaginal pH (acid/ base balance) Allows for alteration / disruption of the normal bacterial flora / microbiome Results in increased risk for urinary tract infection (UTI) due to bacterial overgrowth  Treatment options: Over-the-counter lubricants (see list below). Prescription vaginal estrogen replacement. Options: Topical vaginal estrogen cream Estrace, Premarin, or compounded estradiol cream/ gel We advise: Discard plastic applicator as that tends to use more medication than you need, which is not harmful but wastes / uses up the medication. Also the plastic applicator may cause discomfort. Insert blueberry size amount of medication via the tip of your finger inside vagina nightly for 1 week then 2-3 times per week (long term). Estring vaginal ring Exchanged every 3 months (either at home or in office by provider) Vagifem vaginal tablet Inserted nightly for 2 weeks then twice a week (long term) lntrarosa vaginal suppository Vaginal DHEA: converts to estrogen in vaginal tissue without systemic effect Inserted nightly (long term) Vaginal laser therapy (Mona Lisa touch) Performed in 3 treatments each 6 weeks apart (available in our [place] office). Can feel like a sunburn for 3-4 days after each treatment until new skin heals in. Usually not covered by insurance. Estimated cost is $1500 for all 3 sessions.  FYI regarding prescription vaginal estrogen treatment options: All topical vaginal estrogen replacement options are equivalent in terms of efficacy. Topical vaginal estrogen replacement will take about 3 months to be effective. OK to have sex with any of the topical vaginal estrogen replacement options. Topical vaginal estrogen replacement may sting/burn initially due to severe dryness, which will  improve with ongoing treatment. There have been studies that evaluate use of low-dose intravaginal estrogen that show minimal systemic absorption which is negligible after 3 weeks. There have been no studies indicating increased risk of contributing to cancer development or recurrence.  Topical vaginal estrogen cream safe to use with breast cancer history [email or web address]  Topical vaginal estrogen cream safe  to use with blood clot history [email or web address]   Lubricants and Moisturizers for Treating Genitourinary Syndrome of Menopause and Vulvovaginal Atrophy Treatment Comments I Available Products   Lubricants   Water-based Ingredients: Deionized water, glycerin, propylene glycol; latex safe; rare irritation; dry out with extended sexual activity Astroglide, Good Clean Love, K-Y Jelly, Natural, Organic, Pink, Sliquid, Sylk, Yes    Oil Based Ingredients: avocado, olive, peanut, corn; latex safe; can be used with silicone products; staining; safe (unless peanut allergy); non-irritating Coconut oil, vegetable oil, vitamin E oil  Silicone-Based Ingredients: Silicone polymers; staining; typically nonirritating, long lasting; waterproof; should not be used with silicone dilators, sexual toys, or gynecologic products Astroglide X, Oceanus Ultra Pure, Pink Silicone, Pjur Eros, Replens Silky Smooth, Silicone Premium JO, SKYN, Uberlube, [employer] Based Minimize harm to sperm motility; designed Astroglide TTC, Conceive Plus, Pre for couples trying to conceive Seed, Yes Baby  Fertility Friendly Minimize harm to sperm motility; designed Astroglide, TTC, Conceive Plus, Pre for couples trying to conceive Seed, Yes Baby  Vaginal Moisturizers    Vaginal Moisturizers For maintenance use 1 to 3 times weekly; can benefit women with dryness, chafing with AOL, and recurrent vaginal infections irrespective of sexual activity timing Balance Active Menopause Vaginal Moisturizing Lubricant, Canesintima Intimate Moisturizer, Replens, Rephresh, Sylk Natural Intimate Moisturizer, Yes Vaginal Moisturizer  Hybrids Properties of both water and silicone-based products (combination of a vaginal lubricant and moisturizer); Non-irritating; good option for women with allergies and sensitivities Lubrigyn, Luvena  Suppositories Hyaluronic acid to retain moisture Revaree  Vulvar Soothing Creams/Oils    Medicated CreamsP ain and burn relief; Ingredients: 4% Lidocaine, Aloe Vera gel Releveum ([place])  Non-Medicated Creams For anti-itch and moisture/maintenance; Ingredients: Coconut oil, Avocado oil, Shea Butter, Olive oil, Vitamin E Vajuvenate, Vmagic  Oils !For moisture/maintenance !Coconut oil, Vitamin E oil, Emu oil     Electronically signed by:  Donnita Falls, MSN, FNP-C, CUNP 09/03/2023 12:23 PM

## 2023-09-03 NOTE — Progress Notes (Addendum)
 Patient Office Visit  Assessment & Plan:  Acute bronchitis, unspecified organism -     Benzonatate; Take 1 capsule (100 mg total) by mouth 3 (three) times daily as needed.  Dispense: 30 capsule; Refill: 0 -     Azelastine HCl; Place 1 spray into both nostrils 2 (two) times daily. Use in each nostril as directed- helps with postnasal drip  Dispense: 30 mL; Refill: 1 -     Amoxicillin; Take 1 tablet (875 mg total) by mouth 2 (two) times daily for 10 days.  Dispense: 20 tablet; Refill: 0  Type 2 diabetes mellitus without complication, without long-term current use of insulin (HCC)   Astelin nasal spray and Tessalon Perles sent to the pharmacy.  Pt will take Amoxil if not improving in the next few days. Return if symptoms worsen or fail to improve.   Subjective:    Patient ID: Erika Lee, female    DOB: [birth year]  Age: 78 y.o. MRN: [id number]  Chief Complaint  Patient presents with   Cough    Runny nose, cough x 2 days.    HPI Coughing/possible bronchitis- pt has been sick for the past few days. Pt was at the funeral home this past Thursday for visitation and was around a lot of people. Not having SOB or wheezing, no fever or chills. Pt has used OTC med- tried Robitussin but did not help. Pt has not been able to sleep due to husband being sick who passed away last week.  Patient has been coughing nonstop.  Patient also having postnasal drip.  No sinus pressure or teeth hurting.  Patient has had some congestion.  Patient can take Amoxil (has had this before).  Patient did try Flonase but it caused her nosebleeds.  Patient has never tried Astelin nasal spray. Pt did get flu shot this season.  PMH-Pt has Type 2 DM, Paroxysmal Afib.   The ASCVD Risk score (Arnett DK, et al., 2019) failed to calculate for the following reasons:   Risk score cannot be calculated because patient has a medical history suggesting prior/existing ASCVD  Past Medical History:  Diagnosis Date   Anticoagulant  long-term use    eliquis   Chronic sinusitis    GERD (gastroesophageal reflux disease)    Heart failure with preserved ejection fraction (HCC)    Hemorrhoids    History of colitis 10/2016   campylobacter   Hyperlipidemia    Hypertension    Multinodular goiter    per pt has had 2 biopsy's both benign   NSTEMI (non-ST elevated myocardial infarction) (HCC)    12/2018- normal coronary arteries on cath, poss coronary vasospasm.    Osteoarthritis    "all over"  and [facility] joint left shoulder   Paroxysmal atrial fibrillation [facility]) EP cardiologist--  dr Johney Frame   s/p PVI by Dr Johney Frame 07/16/2013;   pt first dx 2008,  recurrence 2014 afib/flutter and tachy-brady   Rotator cuff tear, left    Shoulder impingement, left    Status post placement of implantable loop recorder    original placement 07-26-2014;  removal / replacement 10-17-2017  by dr allred   Type 2 diabetes mellitus (HCC)    followed by pcp   Past Surgical History:  Procedure Laterality Date   ATRIAL FIBRILLATION ABLATION N/A 07/16/2013   PVI and CTI ablation by Dr Johney Frame   CARDIAC CATHETERIZATION     01/11/2019- Normal coronary arteries.    COLONOSCOPY  09/10/2011   Dr. Lovell Sheehan: normal, 10 year  follow up.   implantable loop recorder removal  04/25/2021   MDT LINQ removed by Dr Johney Frame   LEFT HEART CATH AND CORONARY ANGIOGRAPHY N/A 01/11/2019   Procedure: LEFT HEART CATH AND CORONARY ANGIOGRAPHY;  Surgeon: Runell Gess, MD;  Location: [facility] INVASIVE CV LAB;  Service: Cardiovascular;  Laterality: N/A;   LESION REMOVAL N/A 05/03/2013   Procedure: EXCISION CYST, BACK;  Surgeon: Dalia Heading, MD;  Location: [facility] ORS;  Service: General;  Laterality: N/A;   LOOP RECORDER IMPLANT N/A 07/26/2014   Procedure: LOOP RECORDER IMPLANT;  Surgeon: Hillis Range, MD;  Location: [facility] CATH LAB;  Service: Cardiovascular;  Laterality: N/A;   LOOP RECORDER INSERTION N/A 10/17/2017   MDT previously implanted ILR for afib management at [facility].  old device  removed and new device placed by Dr Johney Frame   LOOP RECORDER REMOVAL N/A 10/17/2017   MDT ILR removed with new device subsequently replaced   SHOULDER ARTHROSCOPY WITH ROTATOR CUFF REPAIR AND SUBACROMIAL DECOMPRESSION Left 01/07/2019   Procedure: Left shoulder subacromial decompression, distal clavicle resection, extensive debridement,;  Surgeon: Yolonda Kida, MD;  Location: [facility];  Service: Orthopedics;  Laterality: Left;   TEE WITHOUT CARDIOVERSION N/A 07/15/2013   Procedure: TRANSESOPHAGEAL ECHOCARDIOGRAM (TEE);  Surgeon: Lewayne Bunting, MD;  Location: [facility] ENDOSCOPY;  Service: Cardiovascular;  Laterality: N/A;   TONSILLECTOMY  age 14   TRANSANAL HEMORRHOIDAL DEARTERIALIZATION N/A 09/22/2015   Procedure: TRANSANAL HEMORRHOIDAL LIGATION/PEXY EUA POSSIBLE HEMORRHOIDECTOMY ;  Surgeon: Karie Soda, MD;  Location: [facility] ORS;  Service: General;  Laterality: N/A;   Social History   Tobacco Use   Smoking status: Former    Current packs/day: 0.00    Types: Cigarettes    Start date: 01/03/1960    Quit date: 01/02/1994    Years since quitting: 29.6    Passive exposure: Past   Smokeless tobacco: Never  Vaping Use   Vaping status: Never Used  Substance Use Topics   Alcohol use: No   Drug use: No   Family History  Problem Relation Age of Onset   Stroke Mother 44   Stroke Maternal Grandmother        stroke   Other Maternal Grandfather        "heart dropsy" kidney issues   Kidney disease Maternal Grandfather    Colon cancer Neg Hx    Allergies  Allergen Reactions   Clindamycin/Lincomycin Rash   Doxycycline Other (See Comments)    Makes heart race   Keflex [Cephalexin] Nausea And Vomiting   Other    Sulfonic Acid (3,5-Dibromo-4-H-Ox-Benz)    Adhesive [Tape] Rash   Latex Rash   Sulfonamide Derivatives Nausea And Vomiting    ROS    Objective:    BP 112/60   Pulse 78   Temp 98.2 F (36.8 C)   Ht 5\' 5"  (1.651 m)   Wt 137 lb 2 oz (62.2 kg)   LMP  (LMP  Unknown)   SpO2 99%   BMI 22.82 kg/m  BP Readings from Last 3 Encounters:  09/03/23 112/60  09/03/23 (!) 164/73  08/08/23 120/72   Wt Readings from Last 3 Encounters:  09/03/23 137 lb 2 oz (62.2 kg)  08/08/23 137 lb 12.8 oz (62.5 kg)  08/04/23 138 lb (62.6 kg)    Physical Exam Vitals and nursing note reviewed.  Constitutional:      Appearance: Normal appearance.  HENT:     Head: Normocephalic.     Right Ear: Tympanic membrane, ear canal  and external ear normal.     Left Ear: Tympanic membrane, ear canal and external ear normal.     Mouth/Throat:     Pharynx: No oropharyngeal exudate or posterior oropharyngeal erythema.     Comments: Patient has postnasal drip Eyes:     Extraocular Movements: Extraocular movements intact.     Pupils: Pupils are equal, round, and reactive to light.  Cardiovascular:     Rate and Rhythm: Normal rate and regular rhythm.     Heart sounds: Normal heart sounds.  Pulmonary:     Effort: Pulmonary effort is normal.     Breath sounds: Normal breath sounds. No wheezing.  Musculoskeletal:     Right lower leg: No edema.     Left lower leg: No edema.  Neurological:     General: No focal deficit present.     Mental Status: She is alert and oriented to person, place, and time.  Psychiatric:        Mood and Affect: Mood normal.        Behavior: Behavior normal.      Results for orders placed or performed in visit on 09/03/23  BLADDER SCAN AMB NON-IMAGING  Result Value Ref Range   Scan Result 241

## 2023-09-03 NOTE — Patient Instructions (Signed)
 Vaginal atrophy I Genitourinary syndrome of menopause (GSM):  What it is: Changes in the vaginal environment (including the vulva and urethra) including: Thinning of the epithelium (skin/ mucosa surface) Can contribute to urinary urgency and frequency Can contribute to dryness, itching, irritation of the vulvar and vaginal tissue Can contribute to pain with intercourse Can contribute to physical changes of the labia, vulva, and vagina such as: Narrowing of the vaginal opening Decreased vaginal length Loss of labial architecture Labial adhesions Pale color of vulvovaginal tissue Loss of pubic hair Allows bacteria to become adherent  Results in increased risk for urinary tract infection (UTI) due to bacterial overgrowth and migration up the urethra into the bladder Change in vaginal pH (acid/ base balance) Allows for alteration / disruption of the normal bacterial flora / microbiome Results in increased risk for urinary tract infection (UTI) due to bacterial overgrowth  Treatment options: Over-the-counter lubricants (see list below). Prescription vaginal estrogen replacement. Options: Topical vaginal estrogen cream Estrace, Premarin, or compounded estradiol cream/ gel We advise: Discard plastic applicator as that tends to use more medication than you need, which is not harmful but wastes / uses up the medication. Also the plastic applicator may cause discomfort. Insert blueberry size amount of medication via the tip of your finger inside vagina nightly for 1 week then 2-3 times per week (long term). Estring vaginal ring Exchanged every 3 months (either at home or in office by provider) Vagifem vaginal tablet Inserted nightly for 2 weeks then twice a week (long term) lntrarosa vaginal suppository Vaginal DHEA: converts to estrogen in vaginal tissue without systemic effect Inserted nightly (long term) Vaginal laser therapy (Mona Lisa touch) Performed in 3 treatments each 6 weeks  apart (available in our [place] office). Can feel like a sunburn for 3-4 days after each treatment until new skin heals in. Usually not covered by insurance. Estimated cost is $1500 for all 3 sessions.  FYI regarding prescription vaginal estrogen treatment options: All topical vaginal estrogen replacement options are equivalent in terms of efficacy. Topical vaginal estrogen replacement will take about 3 months to be effective. OK to have sex with any of the topical vaginal estrogen replacement options. Topical vaginal estrogen replacement may sting/burn initially due to severe dryness, which will improve with ongoing treatment. There have been studies that evaluate use of low-dose intravaginal estrogen that show minimal systemic absorption which is negligible after 3 weeks. There have been no studies indicating increased risk of contributing to cancer development or recurrence.  Topical vaginal estrogen cream safe to use with breast cancer history [email or web address]  Topical vaginal estrogen cream safe to use with blood clot history [email or web address]   Lubricants and Moisturizers for Treating Genitourinary Syndrome of Menopause and Vulvovaginal Atrophy Treatment Comments I Available Products   Lubricants   Water-based Ingredients: Deionized water, glycerin, propylene glycol; latex safe; rare irritation; dry out with extended sexual activity Astroglide, Good Clean Love, K-Y Jelly, Natural, Organic, Pink, Sliquid, Sylk, Yes    Oil Based Ingredients: avocado, olive, peanut, corn; latex safe; can be used with silicone products; staining; safe (unless peanut allergy); non-irritating Coconut oil, vegetable oil, vitamin E oil  Silicone-Based Ingredients:  Silicone polymers; staining; typically nonirritating, long lasting; waterproof; should not be used with silicone dilators, sexual toys, or gynecologic products Astroglide X, Oceanus Ultra Pure, Pink Silicone, Pjur Eros, Replens Silky Smooth, Silicone Premium JO, SKYN, Uberlube, [employer] Based Minimize harm to sperm motility; designed Astroglide TTC, Conceive Plus, Pre for couples trying to conceive Seed,  Yes Baby  Fertility Friendly Minimize harm to sperm motility; designed Astroglide, TTC, Conceive Plus, Pre for couples trying to conceive Seed, Yes Baby  Vaginal Moisturizers   Vaginal Moisturizers For maintenance use 1 to 3 times weekly; can benefit women with dryness, chafing with AOL, and recurrent vaginal infections irrespective of sexual activity timing Balance Active Menopause Vaginal Moisturizing Lubricant, Canesintima Intimate Moisturizer, Replens, Rephresh, Sylk Natural Intimate Moisturizer, Yes Vaginal Moisturizer  Hybrids Properties of both water and silicone-based products (combination of a vaginal lubricant and moisturizer); Non-irritating; good option for women with allergies and sensitivities Lubrigyn, Luvena  Suppositories Hyaluronic acid to retain moisture Revaree  Vulvar Soothing Creams/Oils    Medicated CreamsP ain and burn relief; Ingredients: 4% Lidocaine, Aloe Vera gel Releveum ([place])  Non-Medicated Creams For anti-itch and moisture/maintenance; Ingredients: Coconut oil, Avocado oil, Shea Butter, Olive oil, Vitamin E Vajuvenate, Vmagic  Oils !For moisture/maintenance !Coconut oil, Vitamin E oil, Emu oil

## 2023-09-03 NOTE — Telephone Encounter (Unsigned)
 Copied from CRM [id number]. Topic: Appointments - Scheduling Inquiry for Clinic >> Sep 03, 2023  9:18 AM Ivette P wrote: Reason for CRM: Pt calledin to schedule appt with dr.Pickard. pt had husband pass away and saw over 300 people and might have caught somehting. Dr. Felisa Bonier. Next avail appt is 03/17, pt does not want to wait that long. Pt would like a callback [phone]

## 2023-09-04 LAB — CYTOLOGY, URINE

## 2023-09-05 ENCOUNTER — Other Ambulatory Visit: Payer: Self-pay | Admitting: Urology

## 2023-09-05 ENCOUNTER — Telehealth: Payer: Self-pay

## 2023-09-05 DIAGNOSIS — B3749 Other urogenital candidiasis: Secondary | ICD-10-CM

## 2023-09-05 MED ORDER — FLUCONAZOLE 200 MG PO TABS: 200.0000 mg | ORAL_TABLET | Freq: Every day | ORAL | 0 refills | Status: AC

## 2023-09-05 NOTE — Telephone Encounter (Signed)
 Tried calling patient with no answer, message left to call office.

## 2023-09-05 NOTE — Telephone Encounter (Signed)
-----   Message from Donnita Falls sent at 09/05/2023  2:09 PM EST ----- Regarding: RE: missing information Please let pt know that her voided cytology was negative for evidence of malignant urothelial cells however "fungal organisms are present." I have sent in prescription for Diflucan 200 mg 1x/day for 14 days to treat that. She is advised to proceed with CT and cystoscopy as planned for further evaluation. ----- Message ----- From: Troy Sine, CMA Sent: 09/05/2023  11:57 AM EST To: Donnita Falls, FNP Subject: missing information                            There was nothing noted for me to inform patient, please clarified and advised. ----- Message ----- From: Donnita Falls, FNP Sent: 09/04/2023   5:01 PM EST To: Troy Sine, CMA   ----- Message ----- From: Nell Range Lab Results In Sent: 09/03/2023   3:35 PM EST To: Donnita Falls, FNP

## 2023-09-08 ENCOUNTER — Other Ambulatory Visit: Payer: Self-pay | Admitting: Family Medicine

## 2023-09-08 DIAGNOSIS — M9902 Segmental and somatic dysfunction of thoracic region: Secondary | ICD-10-CM | POA: Diagnosis not present

## 2023-09-08 DIAGNOSIS — M9901 Segmental and somatic dysfunction of cervical region: Secondary | ICD-10-CM | POA: Diagnosis not present

## 2023-09-08 DIAGNOSIS — M546 Pain in thoracic spine: Secondary | ICD-10-CM | POA: Diagnosis not present

## 2023-09-08 DIAGNOSIS — M542 Cervicalgia: Secondary | ICD-10-CM | POA: Diagnosis not present

## 2023-09-08 DIAGNOSIS — M62830 Muscle spasm of back: Secondary | ICD-10-CM | POA: Diagnosis not present

## 2023-09-08 DIAGNOSIS — M9903 Segmental and somatic dysfunction of lumbar region: Secondary | ICD-10-CM | POA: Diagnosis not present

## 2023-09-09 ENCOUNTER — Telehealth: Payer: Self-pay

## 2023-09-09 ENCOUNTER — Ambulatory Visit: Payer: Self-pay | Admitting: Family Medicine

## 2023-09-09 NOTE — Telephone Encounter (Signed)
 Please see other encounter.

## 2023-09-09 NOTE — Telephone Encounter (Signed)
 Copied from CRM [id number]. Topic: Clinical - Medication Question >> Sep 09, 2023  2:33 PM Tiffany S wrote: Reason for CRM: fluconazole (DIFLUCAN) 200 MG tablet [[id number]]  Patient wants to know what the prescription is for before taking it. Patient is requesting call back.

## 2023-09-09 NOTE — Telephone Encounter (Signed)
 Called patient back and explained   fluconazole (DIFLUCAN) 200 MG tablet Is used to treat yeast infection.  Explained her provider prescribed  it for her and she is to take it: pt understand.

## 2023-09-09 NOTE — Telephone Encounter (Addendum)
 Tried calling patient with no answer, letter mailed out and results sent via my chart making patient aware.

## 2023-09-09 NOTE — Telephone Encounter (Signed)
-----   Message from Donnita Falls sent at 09/05/2023  2:09 PM EST ----- Regarding: RE: missing information Please let pt know that her voided cytology was negative for evidence of malignant urothelial cells however "fungal organisms are present." I have sent in prescription for Diflucan 200 mg 1x/day for 14 days to treat that. She is advised to proceed with CT and cystoscopy as planned for further evaluation. ----- Message ----- From: Troy Sine, CMA Sent: 09/05/2023  11:57 AM EST To: Donnita Falls, FNP Subject: missing information                            There was nothing noted for me to inform patient, please clarified and advised. ----- Message ----- From: Donnita Falls, FNP Sent: 09/04/2023   5:01 PM EST To: Troy Sine, CMA   ----- Message ----- From: Nell Range Lab Results In Sent: 09/03/2023   3:35 PM EST To: Donnita Falls, FNP

## 2023-09-09 NOTE — Telephone Encounter (Signed)
 Patient returned missed call.  Please call patient back at [phone] or [phone].

## 2023-09-09 NOTE — Telephone Encounter (Signed)
 Call to patient- is not using this pharmacy - will decline request Requested Prescriptions  Pending Prescriptions Disp Refills   spironolactone (ALDACTONE) 25 MG tablet [Pharmacy Med Name: SPIRONOLACTONE 25MG  TABLETS] 90 tablet 3    Sig: TAKE 1/2 TABLET BY MOUTH DAILY     Cardiovascular: Diuretics - Aldosterone Antagonist Failed - 09/09/2023 11:18 AM      Failed - Valid encounter within last 6 months    Recent Outpatient Visits           1 year ago Erroneous encounter - disregard   [facility] Donita Brooks, MD   1 year ago Controlled type 2 diabetes mellitus with complication, without long-term current use of insulin (HCC)   [facility] Pickard, Priscille Heidelberg, MD   2 years ago Dysuria   [facility] Donita Brooks, MD   2 years ago Dysuria   [facility] Donita Brooks, MD   2 years ago Chronic fatigue   [facility] Donita Brooks, MD       Future Appointments             In 3 months Sheilah Pigeon, PA-C [facility]            Passed - Cr in normal range and within 180 days    Creat  Date Value Ref Range Status  05/05/2023 0.85 0.60 - 1.00 mg/dL Final   Creatinine, Urine  Date Value Ref Range Status  06/11/2022 71 20 - 275 mg/dL Final         Passed - K in normal range and within 180 days    Potassium  Date Value Ref Range Status  05/05/2023 4.3 3.5 - 5.3 mmol/L Final         Passed - Na in normal range and within 180 days    Sodium  Date Value Ref Range Status  05/05/2023 141 135 - 146 mmol/L Final         Passed - eGFR is 30 or above and within 180 days    GFR, Est African American  Date Value Ref Range Status  10/23/2020 78 > OR = 60 mL/min/1.33m2 Final   GFR, Est Non African American  Date Value Ref Range Status  10/23/2020 68 > OR = 60 mL/min/1.57m2 Final   GFR, Estimated  Date Value Ref Range Status  07/23/2021  >60 >60 mL/min Final    Comment:    (NOTE) Calculated using the CKD-EPI Creatinine Equation (2021)    GFR  Date Value Ref Range Status  07/22/2013 72.30 >60.00 mL/min Final   eGFR  Date Value Ref Range Status  05/05/2023 70 > OR = 60 mL/min/1.9m2 Final         Passed - Last BP in normal range    BP Readings from Last 1 Encounters:  09/03/23 112/60

## 2023-09-09 NOTE — Telephone Encounter (Signed)
 Attempted to contact patient, no answer, left vm to return call to office.   Copied from CRM [id number]. Topic: Clinical - Medication Question >> Sep 09, 2023  1:06 PM Clayton Bibles wrote: Reason for CRM: She wants to talk to Dr. Caren Macadam nurse.  She wants to see if should take Amoxicillin and  Fluconazole, 200 MG together.  Please call her at [phone]

## 2023-09-09 NOTE — Telephone Encounter (Signed)
 Patient would like a call back for her results from bladder scan and urology.

## 2023-09-10 NOTE — Telephone Encounter (Signed)
**Note De-identified  [place] Obfuscation** Please advise 

## 2023-09-11 DIAGNOSIS — M170 Bilateral primary osteoarthritis of knee: Secondary | ICD-10-CM | POA: Diagnosis not present

## 2023-09-11 NOTE — Telephone Encounter (Signed)
 Noted.

## 2023-09-15 DIAGNOSIS — M9903 Segmental and somatic dysfunction of lumbar region: Secondary | ICD-10-CM | POA: Diagnosis not present

## 2023-09-15 DIAGNOSIS — M542 Cervicalgia: Secondary | ICD-10-CM | POA: Diagnosis not present

## 2023-09-15 DIAGNOSIS — M546 Pain in thoracic spine: Secondary | ICD-10-CM | POA: Diagnosis not present

## 2023-09-15 DIAGNOSIS — M62830 Muscle spasm of back: Secondary | ICD-10-CM | POA: Diagnosis not present

## 2023-09-15 DIAGNOSIS — M9901 Segmental and somatic dysfunction of cervical region: Secondary | ICD-10-CM | POA: Diagnosis not present

## 2023-09-15 DIAGNOSIS — M9902 Segmental and somatic dysfunction of thoracic region: Secondary | ICD-10-CM | POA: Diagnosis not present

## 2023-09-22 ENCOUNTER — Ambulatory Visit (INDEPENDENT_AMBULATORY_CARE_PROVIDER_SITE_OTHER): Admitting: Family Medicine

## 2023-09-22 VITALS — BP 162/78 | HR 60 | Temp 98.1°F | Resp 16 | Wt 132.0 lb

## 2023-09-22 DIAGNOSIS — R634 Abnormal weight loss: Secondary | ICD-10-CM

## 2023-09-22 DIAGNOSIS — E119 Type 2 diabetes mellitus without complications: Secondary | ICD-10-CM | POA: Diagnosis not present

## 2023-09-22 MED ORDER — AMLODIPINE BESYLATE 5 MG PO TABS: 5.0000 mg | ORAL_TABLET | Freq: Every day | ORAL | 3 refills | Status: AC

## 2023-09-22 NOTE — Progress Notes (Signed)
 Subjective:    Patient ID: Erika Lee, female    DOB: March 10, 1946, 78 y.o.   MRN: [id number] Since I last saw the patient, her husband passed away.  He has been on hospice for over a year.  She been caring for him on a daily basis.  During that time she has lost considerable weight.  Her weight is down 17 pounds from this point last year.  She is not doing anything to lose weight.  She is overdue for mammogram.  She is overdue for a colonoscopy.  She denies any stomach pain or melena or hematochezia.  She has quit smoking more than 20 years ago.  She denies any cough or hematemesis or hemoptysis.  She denies any chest pain or pleurisy.  Thyroid test was normal in November.  She presents today with pain in her right MCP joint and CMC joint.  I believe that this is likely arthritic due to caring for her husband. Past Medical History:  Diagnosis Date   Anticoagulant long-term use    eliquis   Chronic sinusitis    GERD (gastroesophageal reflux disease)    Heart failure with preserved ejection fraction (HCC)    Hemorrhoids    History of colitis 10/2016   campylobacter   Hyperlipidemia    Hypertension    Multinodular goiter    per pt has had 2 biopsy's both benign   NSTEMI (non-ST elevated myocardial infarction) (HCC)    12/2018- normal coronary arteries on cath, poss coronary vasospasm.    Osteoarthritis    "all over"  and [facility] joint left shoulder   Paroxysmal atrial fibrillation [facility]) EP cardiologist--  dr Johney Frame   s/p PVI by Dr Johney Frame 07/16/2013;   pt first dx 2008,  recurrence 2014 afib/flutter and tachy-brady   Rotator cuff tear, left    Shoulder impingement, left    Status post placement of implantable loop recorder    original placement 07-26-2014;  removal / replacement 10-17-2017  by dr allred   Type 2 diabetes mellitus (HCC)    followed by pcp   Past Surgical History:  Procedure Laterality Date   ATRIAL FIBRILLATION ABLATION N/A 07/16/2013   PVI and CTI ablation by Dr Johney Frame    CARDIAC CATHETERIZATION     01/11/2019- Normal coronary arteries.    COLONOSCOPY  09/10/2011   Dr. Lovell Sheehan: normal, 10 year follow up.   implantable loop recorder removal  04/25/2021   MDT LINQ removed by Dr Johney Frame   LEFT HEART CATH AND CORONARY ANGIOGRAPHY N/A 01/11/2019   Procedure: LEFT HEART CATH AND CORONARY ANGIOGRAPHY;  Surgeon: Runell Gess, MD;  Location: [facility] INVASIVE [facility];  Service: Cardiovascular;  Laterality: N/A;   LESION REMOVAL N/A 05/03/2013   Procedure: EXCISION CYST, BACK;  Surgeon: Dalia Heading, MD;  Location: [facility] ORS;  Service: General;  Laterality: N/A;   LOOP RECORDER IMPLANT N/A 07/26/2014   Procedure: LOOP RECORDER IMPLANT;  Surgeon: Hillis Range, MD;  Location: [facility] CATH LAB;  Service: Cardiovascular;  Laterality: N/A;   LOOP RECORDER INSERTION N/A 10/17/2017   MDT previously implanted ILR for afib management at [facility].  old device removed and new device placed by Dr Johney Frame   LOOP RECORDER REMOVAL N/A 10/17/2017   MDT ILR removed with new device subsequently replaced   SHOULDER ARTHROSCOPY WITH ROTATOR CUFF REPAIR AND SUBACROMIAL DECOMPRESSION Left 01/07/2019   Procedure: Left shoulder subacromial decompression, distal clavicle resection, extensive debridement,;  Surgeon: Yolonda Kida, MD;  Location:  [facility];  Service: Orthopedics;  Laterality: Left;   TEE WITHOUT CARDIOVERSION N/A 07/15/2013   Procedure: TRANSESOPHAGEAL ECHOCARDIOGRAM (TEE);  Surgeon: Lewayne Bunting, MD;  Location: [facility] ENDOSCOPY;  Service: Cardiovascular;  Laterality: N/A;   TONSILLECTOMY  age 32   TRANSANAL HEMORRHOIDAL DEARTERIALIZATION N/A 09/22/2015   Procedure: TRANSANAL HEMORRHOIDAL LIGATION/PEXY EUA POSSIBLE HEMORRHOIDECTOMY ;  Surgeon: Karie Soda, MD;  Location: [facility]S;  Service: General;  Laterality: N/A;   Current Outpatient Medications on File Prior to Visit  Medication Sig Dispense Refill   apixaban (ELIQUIS) 5 MG TABS tablet Take 1 tablet (5 mg total)  by mouth 2 (two) times daily. 60 tablet 3   azelastine (ASTELIN) 0.1 % nasal spray Place 1 spray into both nostrils 2 (two) times daily. Use in each nostril as directed- helps with postnasal drip 30 mL 1   diazepam (VALIUM) 5 MG tablet TAKE 1 TABLET(5 MG) BY MOUTH AT BEDTIME AS NEEDED FOR SLEEP 30 tablet 3   empagliflozin (JARDIANCE) 10 MG TABS tablet Take 1 tablet (10 mg total) by mouth daily. TAKE 1 TABLET(10 MG) BY MOUTH DAILY 90 tablet 3   estradiol (ESTRACE) 0.1 MG/GM vaginal cream Place 1 Applicatorful vaginally at bedtime. 42.5 g 12   HYDROcodone-acetaminophen (NORCO) 10-325 MG tablet Take 1 tablet by mouth every 6 (six) hours as needed. 120 tablet 0   lisinopril (ZESTRIL) 40 MG tablet TAKE 1 TABLET(40 MG) BY MOUTH DAILY 90 tablet 3   simvastatin (ZOCOR) 40 MG tablet Take 1 tablet (40 mg total) by mouth daily at 6 PM. 90 tablet 2   spironolactone (ALDACTONE) 25 MG tablet Take 0.5 tablets (12.5 mg total) by mouth daily. 90 tablet 3   benzonatate (TESSALON PERLES) 100 MG capsule Take 1 capsule (100 mg total) by mouth 3 (three) times daily as needed. 30 capsule 0   glucose blood (FREESTYLE PRECISION NEO TEST) test strip DX: E11.9 Use to check blood sugar daily 100 strip 5   Lancets (FREESTYLE) lancets Use to check blood sugar daily. 100 each 5   No current facility-administered medications on file prior to visit.     Allergies  Allergen Reactions   Clindamycin/Lincomycin Rash   Doxycycline Other (See Comments)    Makes heart race   Keflex [Cephalexin] Nausea And Vomiting   Other    Sulfonic Acid (3,5-Dibromo-4-H-Ox-Benz)    Adhesive [Tape] Rash   Latex Rash   Sulfonamide Derivatives Nausea And Vomiting   Social History   Socioeconomic History   Marital status: Married    Spouse name: Interior and spatial designer   Number of children: 2   Years of education: 12   Highest education level: 12th grade  Occupational History   Occupation: Advertising copywriter: RETIRED    Comment: Full  time  Tobacco Use   Smoking status: Former    Current packs/day: 0.00    Types: Cigarettes    Start date: 01/03/1960    Quit date: 01/02/1994    Years since quitting: 29.7    Passive exposure: Past   Smokeless tobacco: Never  Vaping Use   Vaping status: Never Used  Substance and Sexual Activity   Alcohol use: No   Drug use: No   Sexual activity: Not Currently    Partners: Male  Other Topics Concern   Not on file  Social History Narrative   One child deceased from homicide at age 30    Social Drivers of Health   Financial Resource Strain: Low Risk  (  05/21/2022)   Overall Financial Resource Strain (CARDIA)    Difficulty of Paying Living Expenses: Not hard at all  Food Insecurity: No Food Insecurity (05/21/2022)   Hunger Vital Sign    Worried About Running Out of Food in the Last Year: Never true    Ran Out of Food in the Last Year: Never true  Transportation Needs: No Transportation Needs (05/21/2022)   PRAPARE - Administrator, Civil Service (Medical): No    Lack of Transportation (Non-Medical): No  Physical Activity: Inactive (05/21/2022)   Exercise Vital Sign    Days of Exercise per Week: 0 days    Minutes of Exercise per Session: 0 min  Stress: Stress Concern Present (05/21/2022)   [employer] of Occupational Health - Occupational Stress Questionnaire    Feeling of Stress : To some extent  Social Connections: Moderately Integrated (05/21/2022)   Social Connection and Isolation Panel [NHANES]    Frequency of Communication with Friends and Family: More than three times a week    Frequency of Social Gatherings with Friends and Family: Once a week    Attends Religious Services: 1 to 4 times per year    Active Member of [employer] or Organizations: No    Attends Banker Meetings: Not on file    Marital Status: Married  Catering manager Violence: Not At Risk (05/21/2022)   Humiliation, Afraid, Rape, and Kick questionnaire    Fear of Current or  Ex-Partner: No    Emotionally Abused: No    Physically Abused: No    Sexually Abused: No     Review of Systems  All other systems reviewed and are negative.      Objective:   Physical Exam Vitals reviewed. Exam conducted with a chaperone present.  Constitutional:      General: She is not in acute distress.    Appearance: Normal appearance. She is not ill-appearing or toxic-appearing.  Cardiovascular:     Rate and Rhythm: Normal rate and regular rhythm.     Pulses: Normal pulses.     Heart sounds: Normal heart sounds. No murmur heard.    No friction rub. No gallop.  Pulmonary:     Effort: Pulmonary effort is normal. No respiratory distress.     Breath sounds: No stridor. No wheezing, rhonchi or rales.  Abdominal:     General: Abdomen is flat. Bowel sounds are normal. There is no distension.     Palpations: Abdomen is soft.     Tenderness: There is no abdominal tenderness. There is no guarding.  Genitourinary:    General: Normal vulva.     Exam position: Lithotomy position.     Pubic Area: No rash.      Labia:        Right: No rash or lesion.        Left: No rash or lesion.      Urethra: No prolapse, urethral swelling or urethral lesion.     Vagina: Normal.     Cervix: Normal. No cervical motion tenderness, friability or erythema.    Musculoskeletal:     Cervical back: Neck supple.  Neurological:     General: No focal deficit present.     Mental Status: She is alert and oriented to person, place, and time.     Cranial Nerves: No cranial nerve deficit.     Sensory: No sensory deficit.     Motor: No weakness.     Coordination: Coordination normal.  Gait: Gait normal.     Deep Tendon Reflexes: Reflexes normal.  Psychiatric:        Mood and Affect: Mood normal.        Thought Content: Thought content normal.           Assessment & Plan:  Type 2 diabetes mellitus without complication, without long-term current use of insulin (HCC) - Plan: Hemoglobin A1c,  CBC with Differential/Platelet, COMPLETE METABOLIC PANEL WITH GFR, Lipid panel  Unintentional weight loss - Plan: MM Digital Screening, Ambulatory referral to Gastroenterology, CT Chest W Contrast Patient is currently seeing urology for hematuria.  CT scan did show right-sided hydronephrosis.  Cystoscopy has been performed however did not see close in chart.  Uncertain of unintended weight loss.  I suspect that this is due to the stress of caring for her husband.  However to complete the workup I would perform a mammogram, the CT scan of her chest as she is already had a CT scan of the abdomen pelvis, and a GI consult for colonoscopy and EGD.  If the workup is negative, I believe the 17 pound weight loss is due to stress.  Meanwhile today check CBC CMP lipid panel and A1c.  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

## 2023-09-22 NOTE — Progress Notes (Deleted)
 Wt Readings from Last 3 Encounters:  09/22/23 132 lb (59.9 kg)  09/03/23 137 lb 2 oz (62.2 kg)  08/08/23 137 lb 12.8 oz (62.5 kg)

## 2023-09-23 ENCOUNTER — Telehealth: Payer: Self-pay

## 2023-09-23 ENCOUNTER — Other Ambulatory Visit: Payer: Self-pay | Admitting: Family Medicine

## 2023-09-23 ENCOUNTER — Encounter: Payer: Self-pay | Admitting: *Deleted

## 2023-09-23 DIAGNOSIS — E118 Type 2 diabetes mellitus with unspecified complications: Secondary | ICD-10-CM

## 2023-09-23 LAB — LIPID PANEL
Cholesterol: 143 mg/dL (ref <200)
HDL: 46 mg/dL — ABNORMAL LOW (ref >=50)
LDL Cholesterol (Calc): 75 mg/dL
Non-HDL Cholesterol (Calc): 97 mg/dL (ref <130)
Total CHOL/HDL Ratio: 3.1 (calc) (ref <5.0)
Triglycerides: 136 mg/dL (ref <150)

## 2023-09-23 LAB — CBC WITH DIFFERENTIAL/PLATELET
Absolute Lymphocytes: 2366 {cells}/uL (ref 850–3900)
Absolute Monocytes: 880 {cells}/uL (ref 200–950)
Basophils Absolute: 58 {cells}/uL (ref 0–200)
Basophils Relative: 0.7 %
Eosinophils Absolute: 141 {cells}/uL (ref 15–500)
Eosinophils Relative: 1.7 %
HCT: 44.3 % (ref 35.0–45.0)
Hemoglobin: 14.6 g/dL (ref 11.7–15.5)
MCH: 29.3 pg (ref 27.0–33.0)
MCHC: 33 g/dL (ref 32.0–36.0)
MCV: 89 fL (ref 80.0–100.0)
MPV: 10.5 fL (ref 7.5–12.5)
Monocytes Relative: 10.6 %
Neutro Abs: 4856 {cells}/uL (ref 1500–7800)
Neutrophils Relative %: 58.5 %
Platelets: 215 10*3/uL (ref 140–400)
RBC: 4.98 10*6/uL (ref 3.80–5.10)
RDW: 12.2 % (ref 11.0–15.0)
Total Lymphocyte: 28.5 %
WBC: 8.3 10*3/uL (ref 3.8–10.8)

## 2023-09-23 LAB — COMPLETE METABOLIC PANEL WITH GFR
AG Ratio: 2 (calc) (ref 1.0–2.5)
ALT: 16 U/L (ref 6–29)
AST: 12 U/L (ref 10–35)
Albumin: 4.3 g/dL (ref 3.6–5.1)
Alkaline phosphatase (APISO): 92 U/L (ref 37–153)
BUN/Creatinine Ratio: 18 (calc) (ref 6–22)
BUN: 25 mg/dL (ref 7–25)
CO2: 26 mmol/L (ref 20–32)
Calcium: 10.2 mg/dL (ref 8.6–10.4)
Chloride: 105 mmol/L (ref 98–110)
Creat: 1.39 mg/dL — ABNORMAL HIGH (ref 0.60–1.00)
Globulin: 2.2 g/dL (ref 1.9–3.7)
Glucose, Bld: 128 mg/dL — ABNORMAL HIGH (ref 65–99)
Potassium: 4.8 mmol/L (ref 3.5–5.3)
Sodium: 139 mmol/L (ref 135–146)
Total Bilirubin: 0.4 mg/dL (ref 0.2–1.2)
Total Protein: 6.5 g/dL (ref 6.1–8.1)

## 2023-09-23 LAB — HEMOGLOBIN A1C
Hgb A1c MFr Bld: 6.3 %{Hb} — ABNORMAL HIGH (ref <5.7)
Mean Plasma Glucose: 134 mg/dL
eAG (mmol/L): 7.4 mmol/L

## 2023-09-23 NOTE — Telephone Encounter (Signed)
 Patient is made aware and voiced understanding "Please let pt know that her voided cytology was negative for evidence of malignant urothelial cells however "fungal organisms are present." I have sent in prescription for Diflucan 200 mg 1x/day for 14 days to treat that. She is advised to proceed with CT and cystoscopy as planned for further evaluation. "

## 2023-09-23 NOTE — Telephone Encounter (Unsigned)
 Copied from CRM [id number]. Topic: Clinical - Medication Refill >> Sep 23, 2023  9:56 AM Everette C wrote: Most Recent Primary Care Visit:  Provider: Lynnea Ferrier T  Department: [facility]  Visit Type: OFFICE VISIT  Date: 09/22/2023  Medication: glucose blood (FREESTYLE PRECISION NEO TEST) test strip [[id number]]  Has the patient contacted their pharmacy? Yes (Agent: If no, request that the patient contact the pharmacy for the refill. If patient does not wish to contact the pharmacy document the reason why and proceed with request.) (Agent: If yes, when and what did the pharmacy advise?)  Is this the correct pharmacy for this prescription? Yes If no, delete pharmacy and type the correct one.  This is the patient's preferred pharmacy:  [facility] - [place], [place] - [address] [place] [address] Phone: [phone] Fax: [phone]  Has the prescription been filled recently? Yes  Is the patient out of the medication? Yes  Has the patient been seen for an appointment in the last year OR does the patient have an upcoming appointment? Yes  Can we respond through MyChart? No  Agent: Please be advised that Rx refills may take up to 3 business days. We ask that you follow-up with your pharmacy.

## 2023-09-24 DIAGNOSIS — M9903 Segmental and somatic dysfunction of lumbar region: Secondary | ICD-10-CM | POA: Diagnosis not present

## 2023-09-24 DIAGNOSIS — M62830 Muscle spasm of back: Secondary | ICD-10-CM | POA: Diagnosis not present

## 2023-09-24 DIAGNOSIS — M9902 Segmental and somatic dysfunction of thoracic region: Secondary | ICD-10-CM | POA: Diagnosis not present

## 2023-09-24 DIAGNOSIS — M546 Pain in thoracic spine: Secondary | ICD-10-CM | POA: Diagnosis not present

## 2023-09-24 DIAGNOSIS — M542 Cervicalgia: Secondary | ICD-10-CM | POA: Diagnosis not present

## 2023-09-24 DIAGNOSIS — M9901 Segmental and somatic dysfunction of cervical region: Secondary | ICD-10-CM | POA: Diagnosis not present

## 2023-09-24 MED ORDER — FREESTYLE PRECISION NEO TEST VI STRP: ORAL_STRIP | 5 refills | Status: DC

## 2023-09-24 NOTE — Telephone Encounter (Signed)
 Requested Prescriptions  Pending Prescriptions Disp Refills   glucose blood (FREESTYLE PRECISION NEO TEST) test strip 100 strip 5    Sig: DX: E11.9 Use to check blood sugar daily     Endocrinology: Diabetes - Testing Supplies Passed - 09/24/2023  2:45 PM      Passed - Valid encounter within last 12 months    Recent Outpatient Visits           2 days ago Type 2 diabetes mellitus without complication, without long-term current use of insulin (HCC)   [facility] Tanya Nones, Priscille Heidelberg, MD   3 weeks ago Acute bronchitis, unspecified organism   [facility] Bernadette Hoit, MD   1 month ago Hematuria, unspecified type   [facility] Donita Brooks, MD   3 months ago Mid back pain   [facility] Donita Brooks, MD   4 months ago Controlled type 2 diabetes mellitus with complication, without long-term current use of insulin [facility])   [facility] Pickard, Priscille Heidelberg, MD       Future Appointments             In 2 months Sheilah Pigeon, PA-C [facility]

## 2023-09-26 ENCOUNTER — Telehealth: Payer: Self-pay | Admitting: Family Medicine

## 2023-09-29 ENCOUNTER — Other Ambulatory Visit: Payer: Self-pay | Admitting: Family Medicine

## 2023-09-29 DIAGNOSIS — K838 Other specified diseases of biliary tract: Secondary | ICD-10-CM

## 2023-09-29 DIAGNOSIS — R112 Nausea with vomiting, unspecified: Secondary | ICD-10-CM

## 2023-09-30 ENCOUNTER — Ambulatory Visit (HOSPITAL_COMMUNITY)
Admission: RE | Admit: 2023-09-30 | Discharge: 2023-09-30 | Disposition: A | Discharge status: Home / Self Care | Source: Ambulatory Visit | Attending: Urology | Admitting: Urology

## 2023-09-30 DIAGNOSIS — R310 Gross hematuria: Secondary | ICD-10-CM | POA: Diagnosis not present

## 2023-09-30 DIAGNOSIS — I517 Cardiomegaly: Secondary | ICD-10-CM | POA: Insufficient documentation

## 2023-09-30 DIAGNOSIS — I2510 Atherosclerotic heart disease of native coronary artery without angina pectoris: Secondary | ICD-10-CM | POA: Diagnosis not present

## 2023-09-30 DIAGNOSIS — R634 Abnormal weight loss: Secondary | ICD-10-CM | POA: Insufficient documentation

## 2023-09-30 DIAGNOSIS — N1330 Unspecified hydronephrosis: Secondary | ICD-10-CM | POA: Insufficient documentation

## 2023-09-30 DIAGNOSIS — K8020 Calculus of gallbladder without cholecystitis without obstruction: Secondary | ICD-10-CM | POA: Diagnosis not present

## 2023-09-30 DIAGNOSIS — N134 Hydroureter: Secondary | ICD-10-CM | POA: Insufficient documentation

## 2023-09-30 DIAGNOSIS — I700 Atherosclerosis of aorta: Secondary | ICD-10-CM | POA: Insufficient documentation

## 2023-09-30 DIAGNOSIS — N952 Postmenopausal atrophic vaginitis: Secondary | ICD-10-CM | POA: Insufficient documentation

## 2023-09-30 DIAGNOSIS — D259 Leiomyoma of uterus, unspecified: Secondary | ICD-10-CM | POA: Diagnosis not present

## 2023-09-30 DIAGNOSIS — Z87891 Personal history of nicotine dependence: Secondary | ICD-10-CM | POA: Insufficient documentation

## 2023-09-30 MED ORDER — IOHEXOL 350 MG/ML SOLN
100.0000 mL | Freq: Once | INTRAVENOUS | Status: AC | PRN
  Administered 2023-09-30: 100 mL via INTRAVENOUS

## 2023-09-30 NOTE — Progress Notes (Deleted)
 GI Office Note    Referring Provider: Donita Brooks, MD Primary Care Physician:  Donita Brooks, MD  Primary Gastroenterologist:  Chief Complaint   No chief complaint on file.    History of Present Illness   Erika Lee is a 78 y.o. female presenting today at the request of Dr. Tanya Nones for further evaluation of weight loss/colonoscopy.  15 pound weight loss over the past one year***suspected due to stress of caring for her usband who recently died but work up including mammogram, CT scan of chest, planned. GI consult for colonoscopy/EGD.   CT chest with contrast 09/2023: Reuslts pending  CT hematuria work up 09/2023: Pending  CT renal 08/2023: IMPRESSION: 1. Right-sided hydronephrosis and hydroureter. No obstructing stones identified. 2. Distended gallbladder with calcified stones or milk of calcium. 3. Calcified fibroid. 4. Aortic atherosclerosis.  Colonoscopy 08/2011: -normal   Medications   Current Outpatient Medications  Medication Sig Dispense Refill   amLODipine (NORVASC) 5 MG tablet Take 1 tablet (5 mg total) by mouth daily. 90 tablet 3   apixaban (ELIQUIS) 5 MG TABS tablet Take 1 tablet (5 mg total) by mouth 2 (two) times daily. 60 tablet 3   azelastine (ASTELIN) 0.1 % nasal spray Place 1 spray into both nostrils 2 (two) times daily. Use in each nostril as directed- helps with postnasal drip 30 mL 1   benzonatate (TESSALON PERLES) 100 MG capsule Take 1 capsule (100 mg total) by mouth 3 (three) times daily as needed. 30 capsule 0   diazepam (VALIUM) 5 MG tablet TAKE 1 TABLET(5 MG) BY MOUTH AT BEDTIME AS NEEDED FOR SLEEP 30 tablet 3   empagliflozin (JARDIANCE) 10 MG TABS tablet Take 1 tablet (10 mg total) by mouth daily. TAKE 1 TABLET(10 MG) BY MOUTH DAILY 90 tablet 3   estradiol (ESTRACE) 0.1 MG/GM vaginal cream Place 1 Applicatorful vaginally at bedtime. 42.5 g 12   glucose blood (FREESTYLE PRECISION NEO TEST) test strip DX: E11.9 Use to check  blood sugar daily 100 strip 5   HYDROcodone-acetaminophen (NORCO) 10-325 MG tablet Take 1 tablet by mouth every 6 (six) hours as needed. 120 tablet 0   Lancets (FREESTYLE) lancets Use to check blood sugar daily. 100 each 5   lisinopril (ZESTRIL) 40 MG tablet TAKE 1 TABLET(40 MG) BY MOUTH DAILY 90 tablet 3   simvastatin (ZOCOR) 40 MG tablet Take 1 tablet (40 mg total) by mouth daily at 6 PM. 90 tablet 2   spironolactone (ALDACTONE) 25 MG tablet Take 0.5 tablets (12.5 mg total) by mouth daily. 90 tablet 3   No current facility-administered medications for this visit.    Allergies   Allergies as of 10/01/2023 - Review Complete 09/03/2023  Allergen Reaction Noted   Clindamycin/lincomycin Rash 04/13/2013   Doxycycline Other (See Comments) 09/10/2011   Keflex [cephalexin] Nausea And Vomiting 09/13/2015   Other  08/16/2020   Sulfonic acid (3,5-dibromo-4-h-ox-benz)  08/16/2020   Adhesive [tape] Rash 04/13/2013   Latex Rash 07/24/2013   Sulfonamide derivatives Nausea And Vomiting     Past Medical History   Past Medical History:  Diagnosis Date   Anticoagulant long-term use    eliquis   Chronic sinusitis    GERD (gastroesophageal reflux disease)    Heart failure with preserved ejection fraction (HCC)    Hemorrhoids    History of colitis 10/2016   campylobacter   Hyperlipidemia    Hypertension    Multinodular goiter    per pt has had 2  biopsy's both benign   NSTEMI (non-ST elevated myocardial infarction) (HCC)    12/2018- normal coronary arteries on cath, poss coronary vasospasm.    Osteoarthritis    "all over"  and [facility] joint left shoulder   Paroxysmal atrial fibrillation [facility]) EP cardiologist--  dr Johney Frame   s/p PVI by Dr Johney Frame 07/16/2013;   pt first dx 2008,  recurrence 2014 afib/flutter and tachy-brady   Rotator cuff tear, left    Shoulder impingement, left    Status post placement of implantable loop recorder    original placement 07-26-2014;  removal / replacement 10-17-2017   by dr allred   Type 2 diabetes mellitus (HCC)    followed by pcp    Past Surgical History   Past Surgical History:  Procedure Laterality Date   ATRIAL FIBRILLATION ABLATION N/A 07/16/2013   PVI and CTI ablation by Dr Johney Frame   CARDIAC CATHETERIZATION     01/11/2019- Normal coronary arteries.    COLONOSCOPY  09/10/2011   Dr. Lovell Sheehan: normal, 10 year follow up.   implantable loop recorder removal  04/25/2021   MDT LINQ removed by Dr Johney Frame   LEFT HEART CATH AND CORONARY ANGIOGRAPHY N/A 01/11/2019   Procedure: LEFT HEART CATH AND CORONARY ANGIOGRAPHY;  Surgeon: Runell Gess, MD;  Location: [facility] INVASIVE CV LAB;  Service: Cardiovascular;  Laterality: N/A;   LESION REMOVAL N/A 05/03/2013   Procedure: EXCISION CYST, BACK;  Surgeon: Dalia Heading, MD;  Location: [facility] ORS;  Service: General;  Laterality: N/A;   LOOP RECORDER IMPLANT N/A 07/26/2014   Procedure: LOOP RECORDER IMPLANT;  Surgeon: Hillis Range, MD;  Location: [facility] CATH LAB;  Service: Cardiovascular;  Laterality: N/A;   LOOP RECORDER INSERTION N/A 10/17/2017   MDT previously implanted ILR for afib management at [facility].  old device removed and new device placed by Dr Johney Frame   LOOP RECORDER REMOVAL N/A 10/17/2017   MDT ILR removed with new device subsequently replaced   SHOULDER ARTHROSCOPY WITH ROTATOR CUFF REPAIR AND SUBACROMIAL DECOMPRESSION Left 01/07/2019   Procedure: Left shoulder subacromial decompression, distal clavicle resection, extensive debridement,;  Surgeon: Yolonda Kida, MD;  Location: [facility];  Service: Orthopedics;  Laterality: Left;   TEE WITHOUT CARDIOVERSION N/A 07/15/2013   Procedure: TRANSESOPHAGEAL ECHOCARDIOGRAM (TEE);  Surgeon: Lewayne Bunting, MD;  Location: [facility] ENDOSCOPY;  Service: Cardiovascular;  Laterality: N/A;   TONSILLECTOMY  age 43   TRANSANAL HEMORRHOIDAL DEARTERIALIZATION N/A 09/22/2015   Procedure: TRANSANAL HEMORRHOIDAL LIGATION/PEXY EUA POSSIBLE HEMORRHOIDECTOMY ;   Surgeon: Karie Soda, MD;  Location: [facility] ORS;  Service: General;  Laterality: N/A;    Past Family History   Family History  Problem Relation Age of Onset   Stroke Mother 48   Stroke Maternal Grandmother        stroke   Other Maternal Grandfather        "heart dropsy" kidney issues   Kidney disease Maternal Grandfather    Colon cancer Neg Hx     Past Social History   Social History   Socioeconomic History   Marital status: Married    Spouse name: Interior and spatial designer   Number of children: 2   Years of education: 12   Highest education level: 12th grade  Occupational History   Occupation: Advertising copywriter: RETIRED    Comment: Full time  Tobacco Use   Smoking status: Former    Current packs/day: 0.00    Types: Cigarettes    Start date: 01/03/1960  Quit date: 01/02/1994    Years since quitting: 29.7    Passive exposure: Past   Smokeless tobacco: Never  Vaping Use   Vaping status: Never Used  Substance and Sexual Activity   Alcohol use: No   Drug use: No   Sexual activity: Not Currently    Partners: Male  Other Topics Concern   Not on file  Social History Narrative   One child deceased from homicide at age 54    Social Drivers of Health   Financial Resource Strain: Low Risk  (05/21/2022)   Overall Financial Resource Strain (CARDIA)    Difficulty of Paying Living Expenses: Not hard at all  Food Insecurity: No Food Insecurity (05/21/2022)   Hunger Vital Sign    Worried About Running Out of Food in the Last Year: Never true    Ran Out of Food in the Last Year: Never true  Transportation Needs: No Transportation Needs (05/21/2022)   PRAPARE - Administrator, Civil Service (Medical): No    Lack of Transportation (Non-Medical): No  Physical Activity: Inactive (05/21/2022)   Exercise Vital Sign    Days of Exercise per Week: 0 days    Minutes of Exercise per Session: 0 min  Stress: Stress Concern Present (05/21/2022)   [employer] of  Occupational Health - Occupational Stress Questionnaire    Feeling of Stress : To some extent  Social Connections: Moderately Integrated (05/21/2022)   Social Connection and Isolation Panel [NHANES]    Frequency of Communication with Friends and Family: More than three times a week    Frequency of Social Gatherings with Friends and Family: Once a week    Attends Religious Services: 1 to 4 times per year    Active Member of [employer] or Organizations: No    Attends Engineer, structural: Not on file    Marital Status: Married  Catering manager Violence: Not At Risk (05/21/2022)   Humiliation, Afraid, Rape, and Kick questionnaire    Fear of Current or Ex-Partner: No    Emotionally Abused: No    Physically Abused: No    Sexually Abused: No    Review of Systems   General: Negative for anorexia, weight loss, fever, chills, fatigue, weakness. Eyes: Negative for vision changes.  ENT: Negative for hoarseness, difficulty swallowing , nasal congestion. CV: Negative for chest pain, angina, palpitations, dyspnea on exertion, peripheral edema.  Respiratory: Negative for dyspnea at rest, dyspnea on exertion, cough, sputum, wheezing.  GI: See history of present illness. GU:  Negative for dysuria, hematuria, urinary incontinence, urinary frequency, nocturnal urination.  MS: Negative for joint pain, low back pain.  Derm: Negative for rash or itching.  Neuro: Negative for weakness, abnormal sensation, seizure, frequent headaches, memory loss,  confusion.  Psych: Negative for anxiety, depression, suicidal ideation, hallucinations.  Endo: Negative for unusual weight change.  Heme: Negative for bruising or bleeding. Allergy: Negative for rash or hives.  Physical Exam   LMP  (LMP Unknown)    General: Well-nourished, well-developed in no acute distress.  Head: Normocephalic, atraumatic.   Eyes: Conjunctiva pink, no icterus. Mouth: Oropharyngeal mucosa moist and pink , no lesions erythema or  exudate. Neck: Supple without thyromegaly, masses, or lymphadenopathy.  Lungs: Clear to auscultation bilaterally.  Heart: Regular rate and rhythm, no murmurs rubs or gallops.  Abdomen: Bowel sounds are normal, nontender, nondistended, no hepatosplenomegaly or masses,  no abdominal bruits or hernia, no rebound or guarding.   Rectal: *** Extremities: No lower extremity edema.  No clubbing or deformities.  Neuro: Alert and oriented x 4 , grossly normal neurologically.  Skin: Warm and dry, no rash or jaundice.   Psych: Alert and cooperative, normal mood and affect.  Labs   Lab Results  Component Value Date   NA 139 09/22/2023   CL 105 09/22/2023   K 4.8 09/22/2023   CO2 26 09/22/2023   BUN 25 09/22/2023   CREATININE 1.39 (H) 09/22/2023   EGFR 70 05/05/2023   CALCIUM 10.2 09/22/2023   ALBUMIN 2.6 (L) 11/21/2016   GLUCOSE 128 (H) 09/22/2023   Lab Results  Component Value Date   ALT 16 09/22/2023   AST 12 09/22/2023   ALKPHOS 41 11/21/2016   BILITOT 0.4 09/22/2023   Lab Results  Component Value Date   WBC 8.3 09/22/2023   HGB 14.6 09/22/2023   HCT 44.3 09/22/2023   MCV 89.0 09/22/2023   PLT 215 09/22/2023   Lab Results  Component Value Date   HGBA1C 6.3 (H) 09/22/2023   Lab Results  Component Value Date   TSH 1.82 05/05/2023    Imaging Studies   No results found.  Assessment       PLAN   ***   Leanna Battles. Melvyn Neth, MHS, PA-C [facility]

## 2023-10-01 ENCOUNTER — Emergency Department (HOSPITAL_COMMUNITY): Admission: EM | Admit: 2023-10-01 | Discharge: 2023-10-01 | Disposition: A | Discharge status: Home / Self Care

## 2023-10-01 ENCOUNTER — Other Ambulatory Visit: Payer: Self-pay

## 2023-10-01 ENCOUNTER — Encounter (HOSPITAL_COMMUNITY): Payer: Self-pay

## 2023-10-01 ENCOUNTER — Ambulatory Visit: Admitting: Gastroenterology

## 2023-10-01 DIAGNOSIS — E860 Dehydration: Secondary | ICD-10-CM | POA: Diagnosis not present

## 2023-10-01 DIAGNOSIS — N390 Urinary tract infection, site not specified: Secondary | ICD-10-CM | POA: Insufficient documentation

## 2023-10-01 DIAGNOSIS — Z79899 Other long term (current) drug therapy: Secondary | ICD-10-CM | POA: Insufficient documentation

## 2023-10-01 DIAGNOSIS — Z7901 Long term (current) use of anticoagulants: Secondary | ICD-10-CM | POA: Diagnosis not present

## 2023-10-01 DIAGNOSIS — I10 Essential (primary) hypertension: Secondary | ICD-10-CM | POA: Insufficient documentation

## 2023-10-01 DIAGNOSIS — R001 Bradycardia, unspecified: Secondary | ICD-10-CM | POA: Diagnosis not present

## 2023-10-01 DIAGNOSIS — R42 Dizziness and giddiness: Secondary | ICD-10-CM | POA: Diagnosis present

## 2023-10-01 DIAGNOSIS — Z91040 Latex allergy status: Secondary | ICD-10-CM | POA: Insufficient documentation

## 2023-10-01 DIAGNOSIS — I499 Cardiac arrhythmia, unspecified: Secondary | ICD-10-CM | POA: Diagnosis not present

## 2023-10-01 DIAGNOSIS — R531 Weakness: Secondary | ICD-10-CM | POA: Diagnosis not present

## 2023-10-01 LAB — CBC
HCT: 43.8 % (ref 36.0–46.0)
Hemoglobin: 14.5 g/dL (ref 12.0–15.0)
MCH: 29.7 pg (ref 26.0–34.0)
MCHC: 33.1 g/dL (ref 30.0–36.0)
MCV: 89.6 fL (ref 80.0–100.0)
Platelets: 164 10*3/uL (ref 150–400)
RBC: 4.89 MIL/uL (ref 3.87–5.11)
RDW: 13 % (ref 11.5–15.5)
WBC: 8.3 10*3/uL (ref 4.0–10.5)
nRBC: 0 % (ref 0.0–0.2)

## 2023-10-01 LAB — URINALYSIS, ROUTINE W REFLEX MICROSCOPIC
Bilirubin Urine: NEGATIVE
Glucose, UA: >=500 mg/dL — AB
Hgb urine dipstick: NEGATIVE
Ketones, ur: NEGATIVE mg/dL
Nitrite: NEGATIVE
Protein, ur: NEGATIVE mg/dL
Specific Gravity, Urine: 1.029 (ref 1.005–1.030)
pH: 5 (ref 5.0–8.0)

## 2023-10-01 LAB — BASIC METABOLIC PANEL WITH GFR
Anion gap: 12 (ref 5–15)
BUN: 28 mg/dL — ABNORMAL HIGH (ref 8–23)
CO2: 21 mmol/L — ABNORMAL LOW (ref 22–32)
Calcium: 10.2 mg/dL (ref 8.9–10.3)
Chloride: 105 mmol/L (ref 98–111)
Creatinine, Ser: 1.41 mg/dL — ABNORMAL HIGH (ref 0.44–1.00)
GFR, Estimated: 38 mL/min — ABNORMAL LOW (ref >60)
Glucose, Bld: 92 mg/dL (ref 70–99)
Potassium: 4.6 mmol/L (ref 3.5–5.1)
Sodium: 138 mmol/L (ref 135–145)

## 2023-10-01 LAB — RESP PANEL BY RT-PCR (RSV, FLU A&B, COVID)  RVPGX2
Influenza A by PCR: NEGATIVE
Influenza B by PCR: NEGATIVE
Resp Syncytial Virus by PCR: NEGATIVE
SARS Coronavirus 2 by RT PCR: NEGATIVE

## 2023-10-01 LAB — TSH: TSH: 1.372 u[IU]/mL (ref 0.350–4.500)

## 2023-10-01 MED ORDER — LACTATED RINGERS IV BOLUS
1000.0000 mL | Freq: Once | INTRAVENOUS | Status: AC
  Administered 2023-10-01: 1000 mL via INTRAVENOUS

## 2023-10-01 MED ORDER — CIPROFLOXACIN HCL 250 MG PO TABS: 500.0000 mg | ORAL_TABLET | Freq: Once | ORAL | Status: DC

## 2023-10-01 MED ORDER — CIPROFLOXACIN HCL 500 MG PO TABS: 500.0000 mg | ORAL_TABLET | Freq: Two times a day (BID) | ORAL | 0 refills | Status: DC

## 2023-10-01 MED ORDER — FOSFOMYCIN TROMETHAMINE 3 G PO PACK: 3.0000 g | PACK | Freq: Once | ORAL | Status: DC

## 2023-10-01 NOTE — Telephone Encounter (Signed)
 Requested medication (s) are due for refill today: -  Requested medication (s) are on the active medication list: no  Last refill:  09/29/23  Future visit scheduled: no  Notes to clinic:  cancelled 09/22/23 Dc'd by PCP   Requested Prescriptions  Pending Prescriptions Disp Refills   promethazine (PHENERGAN) 12.5 MG tablet [Pharmacy Med Name: promethazine 12.5 mg tablet] 30 tablet 3    Sig: TAKE 1 TABLET(12.5 MG) BY MOUTH EVERY 6 HOURS AS NEEDED FOR NAUSEA OR VOMITING     Not Delegated - Gastroenterology: Antiemetics Failed - 10/01/2023 11:40 AM      Failed - This refill cannot be delegated      Passed - Valid encounter within last 6 months    Recent Outpatient Visits           1 week ago Type 2 diabetes mellitus without complication, without long-term current use of insulin (HCC)   [place] [facility] Donita Brooks, MD   4 weeks ago Acute bronchitis, unspecified organism   [place] [facility] Bernadette Hoit, MD   1 month ago Hematuria, unspecified type   [place] [facility] Donita Brooks, MD   3 months ago Mid back pain   [place] [facility] Donita Brooks, MD   4 months ago Controlled type 2 diabetes mellitus with complication, without long-term current use of insulin [facility])   [place] [facility] Pickard, Priscille Heidelberg, MD       Future Appointments             In 2 months Sheilah Pigeon, PA-C [facility], [id number]

## 2023-10-01 NOTE — ED Triage Notes (Signed)
 BIB EMS from home c/o weakness and feeling "jittery" since this am. Per family, pt is not eating and drinking like normal. When asked about why she hasn't been eating, was told that her husband passed away in 2025-02-27and that she is seeing multiple drs to try to find out why she is losing weight. CBG 112, VS WNL. Takes blood thinners daily. Borderline diabetic, per pt.

## 2023-10-01 NOTE — ED Notes (Signed)
 Pt ambulated to bathroom

## 2023-10-01 NOTE — ED Notes (Signed)
 Pt upset sitting at bedside. This nurse encouraged pt to stay to speak with EDP. Pt denied. Pt signed AMA form.

## 2023-10-01 NOTE — Discharge Instructions (Signed)
 Please take the ciprofloxacin as prescribed.  There does appear to be a blockage in one of your ureters which is the tube that goes from your kidney to your bladder.  Is important to keep your appointment with the urologist next week.  Please drink plenty of fluids and follow-up with them next week.  Return to the ER for fevers or worsening symptoms.

## 2023-10-01 NOTE — ED Provider Notes (Addendum)
 [place] EMERGENCY DEPARTMENT AT [facility] Provider Note   CSN: [id number] Arrival date & time: 10/01/23  1321     History  Chief Complaint  Patient presents with   Weakness   jittery    Erika Lee is a 78 y.o. female.  78 year old female with past medical history of atrial fibrillation on Eliquis, hypertension, and hyperlipidemia presenting to the emergency department today with some generalized weakness and lightheadedness.  The patient has been losing weight now over the past few months.  She did recently lose her husband last month.  States she has not really been eating or drinking as much since then.  She states that she has been losing weight now for the past year or so.  She has seen her primary care provider regarding this and has had a fairly extensive workup as an outpatient.  She came to the ER today primarily because she was feeling generally weak with some lightheadedness this morning.  She is scheduled for a colonoscopy and endoscopy as well as a mammogram and cystoscopy for some hematuria she was having a few months ago for further workup for the generalized weight loss.  She states she is feeling a little better since she is arrived to the emergency department.   Weakness      Home Medications Prior to Admission medications   Medication Sig Start Date End Date Taking? Authorizing Provider  ciprofloxacin (CIPRO) 500 MG tablet Take 1 tablet (500 mg total) by mouth every 12 (twelve) hours. 10/01/23  Yes Durwin Glaze, MD  amLODipine (NORVASC) 5 MG tablet Take 1 tablet (5 mg total) by mouth daily. 09/22/23   Donita Brooks, MD  apixaban (ELIQUIS) 5 MG TABS tablet Take 1 tablet (5 mg total) by mouth 2 (two) times daily. 06/12/23   Donita Brooks, MD  azelastine (ASTELIN) 0.1 % nasal spray Place 1 spray into both nostrils 2 (two) times daily. Use in each nostril as directed- helps with postnasal drip 09/03/23   Bernadette Hoit, MD  benzonatate (TESSALON  PERLES) 100 MG capsule Take 1 capsule (100 mg total) by mouth 3 (three) times daily as needed. 09/03/23   Bernadette Hoit, MD  diazepam (VALIUM) 5 MG tablet TAKE 1 TABLET(5 MG) BY MOUTH AT BEDTIME AS NEEDED FOR SLEEP 06/12/23   Donita Brooks, MD  empagliflozin (JARDIANCE) 10 MG TABS tablet Take 1 tablet (10 mg total) by mouth daily. TAKE 1 TABLET(10 MG) BY MOUTH DAILY 06/12/23   Donita Brooks, MD  estradiol (ESTRACE) 0.1 MG/GM vaginal cream Place 1 Applicatorful vaginally at bedtime. 08/08/23   Donita Brooks, MD  glucose blood (FREESTYLE PRECISION NEO TEST) test strip DX: E11.9 Use to check blood sugar daily 09/24/23   Donita Brooks, MD  HYDROcodone-acetaminophen (NORCO) 10-325 MG tablet Take 1 tablet by mouth every 6 (six) hours as needed. 09/02/23   Donita Brooks, MD  Lancets (FREESTYLE) lancets Use to check blood sugar daily. 12/31/22   Donita Brooks, MD  lisinopril (ZESTRIL) 40 MG tablet TAKE 1 TABLET(40 MG) BY MOUTH DAILY 08/04/23   Donita Brooks, MD  simvastatin (ZOCOR) 40 MG tablet Take 1 tablet (40 mg total) by mouth daily at 6 PM. 06/12/23   Donita Brooks, MD  spironolactone (ALDACTONE) 25 MG tablet Take 0.5 tablets (12.5 mg total) by mouth daily. 06/12/23   Donita Brooks, MD      Allergies    Clindamycin/lincomycin; Doxycycline; Keflex [cephalexin]; Other; Sulfonic  acid (3,5-dibromo-4-h-ox-benz); Adhesive [tape]; Latex; and Sulfonamide derivatives    Review of Systems   Review of Systems  Neurological:  Positive for weakness.  All other systems reviewed and are negative.   Physical Exam Updated Vital Signs BP 136/66   Pulse (!) 59   Temp 98.2 F (36.8 C) (Oral)   Resp 15   Ht 5\' 5"  (1.651 m)   Wt 58.1 kg   LMP  (LMP Unknown)   SpO2 97%   BMI 21.30 kg/m  Physical Exam Vitals and nursing note reviewed.   Gen: NAD Eyes: PERRL, EOMI HEENT: no oropharyngeal swelling Neck: trachea midline Resp: clear to auscultation bilaterally Card: RRR, no  murmurs, rubs, or gallops Abd: nontender, nondistended Extremities: no calf tenderness, no edema Vascular: 2+ radial pulses bilaterally, 2+ DP pulses bilaterally Neuro: cranial nerves intact, strength sensation throughout bilateral upper lower extremities Skin: no rashes Psyc: acting appropriately   ED Results / Procedures / Treatments   Labs (all labs ordered are listed, but only abnormal results are displayed) Labs Reviewed  BASIC METABOLIC PANEL WITH GFR - Abnormal; Notable for the following components:      Result Value   CO2 21 (*)    BUN 28 (*)    Creatinine, Ser 1.41 (*)    GFR, Estimated 38 (*)    All other components within normal limits  URINALYSIS, ROUTINE W REFLEX MICROSCOPIC - Abnormal; Notable for the following components:   Glucose, UA >=500 (*)    Leukocytes,Ua MODERATE (*)    Bacteria, UA RARE (*)    All other components within normal limits  RESP PANEL BY RT-PCR (RSV, FLU A&B, COVID)  [id number]  CBC  TSH    EKG EKG Interpretation Date/Time:  Wednesday October 01 2023 15:23:41 EDT Ventricular Rate:  51 PR Interval:  185 QRS Duration:  146 QT Interval:  458 QTC Calculation: 422 R Axis:   86  Text Interpretation: Sinus rhythm Right bundle branch block Confirmed by Beckey Downing [phone]) on 10/01/2023 3:49:29 PM  Radiology CT HEMATURIA WORKUP Result Date: 10/01/2023 CLINICAL DATA:  Unintentional weight loss and gross hematuria EXAM: CT CHEST WITH CONTRAST CT ABDOMEN AND PELVIS WITH AND WITHOUT CONTRAST TECHNIQUE: Multidetector CT imaging of the chest was performed during intravenous contrast administration. Multidetector CT imaging of the abdomen and pelvis was performed following the standard protocol before and during bolus administration of intravenous contrast. RADIATION DOSE REDUCTION: This exam was performed according to the departmental dose-optimization program which includes automated exposure control, adjustment of the mA and/or kV according to patient size  and/or use of iterative reconstruction technique. CONTRAST:  OMNIPAQUE IOHEXOL 350 MG/ML SOLN COMPARISON:  CT abdomen and pelvis dated 08/14/2023, CTA chest dated 01/09/2019 FINDINGS: CT CHEST FINDINGS Cardiovascular: Multichamber cardiomegaly. No significant pericardial fluid/thickening. Great vessels are normal in course and caliber. No central pulmonary emboli. Coronary artery calcifications. Mediastinum/Nodes: Enlarged, heterogeneous thyroid gland, previously evaluated. Normal esophagus. No pathologically enlarged axillary, supraclavicular, mediastinal, or hilar lymph nodes. Lungs/Pleura: The central airways are patent. No suspicious pulmonary nodules. No focal consolidation. No pneumothorax. No pleural effusion. Musculoskeletal: No acute or abnormal lytic or blastic osseous lesions. Multilevel degenerative changes of the thoracic spine. CT ABDOMEN PELVIS FINDINGS Hepatobiliary: Subcentimeter segment 5 hypodensity (9:21), too small to characterize. No intra or extrahepatic biliary ductal dilation. Cholelithiasis. Pancreas: No focal lesions or main ductal dilation. Spleen: Normal in size without focal abnormality. Adrenals/Urinary Tract: No adrenal nodules. Similar mild-to-moderate right hydroureteronephrosis to the level of the distal right ureter,  where there is abrupt cut off and enhancing soft tissue density (12:46, 13:65). Delayed right nephrogram. No suspicious renal masses or calculi. No suspicious filling defect visualized within the opacified portions of the left renal collecting system or ureter on delayed imaging. No focal bladder wall thickening. Stomach/Bowel: Normal appearance of the stomach. No evidence of bowel wall thickening or inflammatory changes. Dilated rectum contains large volume stool. Moderate to large volume stool within the remainder of the colon. Appendix is not discretely seen. Vascular/Lymphatic: Aortic atherosclerosis. No enlarged abdominal or pelvic lymph nodes.  Reproductive: Calcified uterine leiomyoma.  No adnexal masses. Other: No free fluid, fluid collection, or free air. Musculoskeletal: No acute or abnormal lytic or blastic osseous findings. Multilevel degenerative changes of the lumbar spine. IMPRESSION: CT CHEST: 1. No acute intrathoracic abnormality. 2. Multichamber cardiomegaly. 3. Coronary artery calcifications. Assessment for potential risk factor modification, dietary therapy or pharmacologic therapy may be warranted, if clinically indicated. CT ABDOMEN AND PELVIS: 1. Similar mild-to-moderate right hydroureteronephrosis to the level of the distal right ureter, where there is abrupt cut off and enhancing soft tissue density, suspicious for urothelial neoplasm. Recommend further evaluation with cystoscopy. 2. Dilated rectum contains large volume stool. Moderate to large volume stool within the remainder of the colon. Recommend correlation with constipation. 3. Cholelithiasis. Aortic Atherosclerosis (ICD10-I70.0). Electronically Signed   By: Agustin Cree M.D.   On: 10/01/2023 16:21   CT Chest W Contrast Result Date: 10/01/2023 CLINICAL DATA:  Unintentional weight loss and gross hematuria EXAM: CT CHEST WITH CONTRAST CT ABDOMEN AND PELVIS WITH AND WITHOUT CONTRAST TECHNIQUE: Multidetector CT imaging of the chest was performed during intravenous contrast administration. Multidetector CT imaging of the abdomen and pelvis was performed following the standard protocol before and during bolus administration of intravenous contrast. RADIATION DOSE REDUCTION: This exam was performed according to the departmental dose-optimization program which includes automated exposure control, adjustment of the mA and/or kV according to patient size and/or use of iterative reconstruction technique. CONTRAST:  OMNIPAQUE IOHEXOL 350 MG/ML SOLN COMPARISON:  CT abdomen and pelvis dated 08/14/2023, CTA chest dated 01/09/2019 FINDINGS: CT CHEST FINDINGS Cardiovascular: Multichamber  cardiomegaly. No significant pericardial fluid/thickening. Great vessels are normal in course and caliber. No central pulmonary emboli. Coronary artery calcifications. Mediastinum/Nodes: Enlarged, heterogeneous thyroid gland, previously evaluated. Normal esophagus. No pathologically enlarged axillary, supraclavicular, mediastinal, or hilar lymph nodes. Lungs/Pleura: The central airways are patent. No suspicious pulmonary nodules. No focal consolidation. No pneumothorax. No pleural effusion. Musculoskeletal: No acute or abnormal lytic or blastic osseous lesions. Multilevel degenerative changes of the thoracic spine. CT ABDOMEN PELVIS FINDINGS Hepatobiliary: Subcentimeter segment 5 hypodensity (9:21), too small to characterize. No intra or extrahepatic biliary ductal dilation. Cholelithiasis. Pancreas: No focal lesions or main ductal dilation. Spleen: Normal in size without focal abnormality. Adrenals/Urinary Tract: No adrenal nodules. Similar mild-to-moderate right hydroureteronephrosis to the level of the distal right ureter, where there is abrupt cut off and enhancing soft tissue density (12:46, 13:65). Delayed right nephrogram. No suspicious renal masses or calculi. No suspicious filling defect visualized within the opacified portions of the left renal collecting system or ureter on delayed imaging. No focal bladder wall thickening. Stomach/Bowel: Normal appearance of the stomach. No evidence of bowel wall thickening or inflammatory changes. Dilated rectum contains large volume stool. Moderate to large volume stool within the remainder of the colon. Appendix is not discretely seen. Vascular/Lymphatic: Aortic atherosclerosis. No enlarged abdominal or pelvic lymph nodes. Reproductive: Calcified uterine leiomyoma.  No adnexal masses. Other: No free fluid,  fluid collection, or free air. Musculoskeletal: No acute or abnormal lytic or blastic osseous findings. Multilevel degenerative changes of the lumbar spine.  IMPRESSION: CT CHEST: 1. No acute intrathoracic abnormality. 2. Multichamber cardiomegaly. 3. Coronary artery calcifications. Assessment for potential risk factor modification, dietary therapy or pharmacologic therapy may be warranted, if clinically indicated. CT ABDOMEN AND PELVIS: 1. Similar mild-to-moderate right hydroureteronephrosis to the level of the distal right ureter, where there is abrupt cut off and enhancing soft tissue density, suspicious for urothelial neoplasm. Recommend further evaluation with cystoscopy. 2. Dilated rectum contains large volume stool. Moderate to large volume stool within the remainder of the colon. Recommend correlation with constipation. 3. Cholelithiasis. Aortic Atherosclerosis (ICD10-I70.0). Electronically Signed   By: Agustin Cree M.D.   On: 10/01/2023 16:21    Procedures Procedures    Medications Ordered in ED Medications  ciprofloxacin (CIPRO) tablet 500 mg (has no administration in time range)  lactated ringers bolus 1,000 mL (0 mLs Intravenous Stopped 10/01/23 1638)    ED Course/ Medical Decision Making/ A&P                                 Medical Decision Making 78 year old female with past medical history of atrial fibrillation on Eliquis, hypertension, and hyperlipidemia presenting to the emergency department today with some generalized weakness and lightheadedness.  This is in the setting of decreased appetite and recent loss of her husband.  The patient has had a relatively extensive workup as an outpatient.  I will further evaluate her here with basic labs to evaluate for anemia or electrolyte abnormalities.  Will obtain EKG and keep patient on the cardiac monitor to evaluate for arrhythmias or conduction disturbances.  I will give patient IV fluids here.  Will obtain RSV/COVID/flu swab to evaluate for viral etiologies given her generalized weakness.  I will also obtain a urinalysis to evaluate for recurrent urinary tract infection.  I will reevaluate for  ultimate disposition.  The patient's work appears reassuring overall.  Her urinalysis does shows findings concerning for infection.  CT scan performed yesterday was read after I called the radiology department and this does social hydronephrosis and possible urothelial lesion.  I did call and discussed this with Dr. Pete Glatter.  He recommends oral antibiotics and have the patient follow-up after reviewing her imaging and labs.  She does have an appointment next week.  The patient is given Cipro and is discharged with return precautions after discussion with urology.  I was informed by nursing staff that the patient signed out AGAINST MEDICAL ADVICE before I could reassess.  I did call the patient's mobile phone number and left a message and sent a prescription for ciprofloxacin to her pharmacy and encouraged her to keep her urology appointment on the message.  Amount and/or Complexity of Data Reviewed Labs: ordered.  Risk Prescription drug management.           Final Clinical Impression(s) / ED Diagnoses Final diagnoses:  Urinary tract infection without hematuria, site unspecified    Rx / DC Orders ED Discharge Orders          Ordered    ciprofloxacin (CIPRO) 500 MG tablet  Every 12 hours        10/01/23 1828              Durwin Glaze, MD 10/01/23 1830    Durwin Glaze, MD 10/01/23 [phone]

## 2023-10-02 ENCOUNTER — Telehealth: Payer: Self-pay

## 2023-10-02 NOTE — Transitions of Care (Post Inpatient/ED Visit) (Signed)
   10/02/2023  Name: Erika Lee MRN: [id number] DOB: 13-Jan-1946  Today's TOC FU Call Status: Today's TOC FU Call Status:: Unsuccessful Call (1st Attempt) Unsuccessful Call (1st Attempt) Date: 10/02/23  Attempted to reach the patient regarding the most recent Inpatient/ED visit.  Follow Up Plan: Additional outreach attempts will be made to reach the patient to complete the Transitions of Care (Post Inpatient/ED visit) call.   Signature Karena Addison, LPN [facility] Nurse Health Advisor Direct Dial [phone]

## 2023-10-03 ENCOUNTER — Encounter: Payer: Self-pay | Admitting: Family Medicine

## 2023-10-03 ENCOUNTER — Other Ambulatory Visit: Payer: Self-pay | Admitting: Family Medicine

## 2023-10-03 ENCOUNTER — Ambulatory Visit (INDEPENDENT_AMBULATORY_CARE_PROVIDER_SITE_OTHER): Admitting: Family Medicine

## 2023-10-03 VITALS — BP 142/68 | HR 57 | Temp 97.7°F | Ht 65.0 in | Wt 130.4 lb

## 2023-10-03 DIAGNOSIS — Z7984 Long term (current) use of oral hypoglycemic drugs: Secondary | ICD-10-CM

## 2023-10-03 DIAGNOSIS — R634 Abnormal weight loss: Secondary | ICD-10-CM | POA: Diagnosis not present

## 2023-10-03 DIAGNOSIS — R319 Hematuria, unspecified: Secondary | ICD-10-CM

## 2023-10-03 DIAGNOSIS — N138 Other obstructive and reflux uropathy: Secondary | ICD-10-CM

## 2023-10-03 DIAGNOSIS — N1330 Unspecified hydronephrosis: Secondary | ICD-10-CM

## 2023-10-03 DIAGNOSIS — E118 Type 2 diabetes mellitus with unspecified complications: Secondary | ICD-10-CM | POA: Diagnosis not present

## 2023-10-03 MED ORDER — HYDROCODONE-ACETAMINOPHEN 10-325 MG PO TABS: 1.0000 | ORAL_TABLET | Freq: Four times a day (QID) | ORAL | 0 refills | Status: DC | PRN

## 2023-10-03 MED ORDER — FREESTYLE PRECISION NEO TEST VI STRP
ORAL_STRIP | 5 refills | Status: DC
Start: 2023-10-03 — End: 2023-10-13

## 2023-10-03 NOTE — Telephone Encounter (Signed)
 Requested medication (s) are due for refill today: Yes  Requested medication (s) are on the active medication list: Yes  Last refill:  06/12/23 #30, 2 refills  Future visit scheduled: No (seen in office 10/03/23)  Notes to clinic:  Unable to refill per protocol, cannot delegate.      Requested Prescriptions  Pending Prescriptions Disp Refills   diazepam (VALIUM) 5 MG tablet [Pharmacy Med Name: diazepam 5 mg tablet] 30 tablet 3    Sig: TAKE 1 TABLET(5 MG) BY MOUTH AT BEDTIME AS NEEDED FOR SLEEP     Not Delegated - Psychiatry: Anxiolytics/Hypnotics 2 Failed - 10/03/2023  4:16 PM      Failed - This refill cannot be delegated      Failed - Urine Drug Screen completed in last 360 days      Passed - Patient is not pregnant      Passed - Valid encounter within last 6 months    Recent Outpatient Visits           Today Unintentional weight loss   [place] [facility] Pickard, Priscille Heidelberg, MD   1 week ago Type 2 diabetes mellitus without complication, without long-term current use of insulin [facility])   [place] [facility] Pickard, Priscille Heidelberg, MD   1 month ago Acute bronchitis, unspecified organism   [place] [facility] Bernadette Hoit, MD   1 month ago Hematuria, unspecified type   [place] [facility] Donita Brooks, MD   3 months ago Mid back pain   [place] [facility] Pickard, Priscille Heidelberg, MD       Future Appointments             In 2 months Sheilah Pigeon, PA-C [id number]

## 2023-10-03 NOTE — Progress Notes (Signed)
 Subjective:    Patient ID: Erika Lee, female    DOB: 02-17-1946, 78 y.o.   MRN: [id number] 09/22/23 Since I last saw the patient, her husband passed away.  He has been on hospice for over a year.  She been caring for him on a daily basis.  During that time she has lost considerable weight.  Her weight is down 17 pounds from this point last year.  She is not doing anything to lose weight.  She is overdue for mammogram.  She is overdue for a colonoscopy.  She denies any stomach pain or melena or hematochezia.  She has quit smoking more than 20 years ago.  She denies any cough or hematemesis or hemoptysis.  She denies any chest pain or pleurisy.  Thyroid test was normal in November.  She presents today with pain in her right MCP joint and CMC joint.  I believe that this is likely arthritic due to caring for her husband.  At that time, my plan was: Patient is currently seeing urology for hematuria.  CT scan did show right-sided hydronephrosis.  Cystoscopy is pending.  Uncertain of the cause for the weight loss.  I suspect that this is due to the stress of caring for her husband.  However to complete the workup I would perform a mammogram, the CT scan of her chest as she is already had a CT scan of the abdomen pelvis, and a GI consult for colonoscopy and EGD.  If the workup is negative, I believe the 17 pound weight loss is due to stress.  Meanwhile today check CBC CMP lipid panel and A1c.  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  10/03/23 Patient underwent a CT scan of the chest abdomen and pelvis due for her unintentional weight loss.  It confirmed right-sided hydronephrosis however it also made mention of a soft tissue density at the distal end of the ureter possibly obstructing the ureter causing right-sided hydronephrosis.  Renal function had also..  In the fall, her creatinine was well below 1.  It is now 1.4.  I believe this is due to to obstructive  nephropathy.  Patient went to the emergency room feeling weak and shaky.  CBC and lab work was significant for an elevation in her creatinine above her baseline which I discussed earlier as well as a possible urinary tract infection.  Patient is currently on lisinopril, Jardiance, and spironolactone for congestive heart failure with preserved ejection fraction Past Medical History:  Diagnosis Date   Anticoagulant long-term use    eliquis   Chronic sinusitis    GERD (gastroesophageal reflux disease)    Heart failure with preserved ejection fraction (HCC)    Hemorrhoids    History of colitis 10/2016   campylobacter   Hyperlipidemia    Hypertension    Multinodular goiter    per pt has had 2 biopsy's both benign   NSTEMI (non-ST elevated myocardial infarction) (HCC)    12/2018- normal coronary arteries on cath, poss coronary vasospasm.    Osteoarthritis    "all over"  and [facility] joint left shoulder   Paroxysmal atrial fibrillation [facility]) EP cardiologist--  dr Johney Frame   s/p PVI by Dr Johney Frame 07/16/2013;   pt first dx 2008,  recurrence 2014 afib/flutter and tachy-brady   Rotator cuff tear, left    Shoulder impingement, left    Status post placement of implantable loop recorder    original placement  07-26-2014;  removal / replacement 10-17-2017  by dr allred   Type 2 diabetes mellitus (HCC)    followed by pcp   Past Surgical History:  Procedure Laterality Date   ATRIAL FIBRILLATION ABLATION N/A 07/16/2013   PVI and CTI ablation by Dr Johney Frame   CARDIAC CATHETERIZATION     01/11/2019- Normal coronary arteries.    COLONOSCOPY  09/10/2011   Dr. Lovell Sheehan: normal, 10 year follow up.   implantable loop recorder removal  04/25/2021   MDT LINQ removed by Dr Johney Frame   LEFT HEART CATH AND CORONARY ANGIOGRAPHY N/A 01/11/2019   Procedure: LEFT HEART CATH AND CORONARY ANGIOGRAPHY;  Surgeon: Runell Gess, MD;  [facility];  Service: Cardiovascular;  Laterality: N/A;   LESION REMOVAL N/A  05/03/2013   Procedure: EXCISION CYST, BACK;  Surgeon: Dalia Heading, MD;  Location: [facility];  Service: General;  Laterality: N/A;   LOOP RECORDER IMPLANT N/A 07/26/2014   Procedure: LOOP RECORDER IMPLANT;  Surgeon: Hillis Range, MD;  Location: [facility] CATH LAB;  Service: Cardiovascular;  Laterality: N/A;   LOOP RECORDER INSERTION N/A 10/17/2017   MDT previously implanted ILR for afib management at [facility].  old device removed and new device placed by Dr Johney Frame   LOOP RECORDER REMOVAL N/A 10/17/2017   MDT ILR removed with new device subsequently replaced   SHOULDER ARTHROSCOPY WITH ROTATOR CUFF REPAIR AND SUBACROMIAL DECOMPRESSION Left 01/07/2019   Procedure: Left shoulder subacromial decompression, distal clavicle resection, extensive debridement,;  Surgeon: Yolonda Kida, MD;  Location: [facility];  Service: Orthopedics;  Laterality: Left;   TEE WITHOUT CARDIOVERSION N/A 07/15/2013   Procedure: TRANSESOPHAGEAL ECHOCARDIOGRAM (TEE);  Surgeon: Lewayne Bunting, MD;  Location: [facility] ENDOSCOPY;  Service: Cardiovascular;  Laterality: N/A;   TONSILLECTOMY  age 65   TRANSANAL HEMORRHOIDAL DEARTERIALIZATION N/A 09/22/2015   Procedure: TRANSANAL HEMORRHOIDAL LIGATION/PEXY EUA POSSIBLE HEMORRHOIDECTOMY ;  Surgeon: Karie Soda, MD;  Location: [facility];  Service: General;  Laterality: N/A;   Current Outpatient Medications on File Prior to Visit  Medication Sig Dispense Refill   amLODipine (NORVASC) 5 MG tablet Take 1 tablet (5 mg total) by mouth daily. 90 tablet 3   apixaban (ELIQUIS) 5 MG TABS tablet Take 1 tablet (5 mg total) by mouth 2 (two) times daily. 60 tablet 3   azelastine (ASTELIN) 0.1 % nasal spray Place 1 spray into both nostrils 2 (two) times daily. Use in each nostril as directed- helps with postnasal drip 30 mL 1   benzonatate (TESSALON PERLES) 100 MG capsule Take 1 capsule (100 mg total) by mouth 3 (three) times daily as needed. 30 capsule 0   ciprofloxacin (CIPRO) 500 MG  tablet Take 1 tablet (500 mg total) by mouth every 12 (twelve) hours. 10 tablet 0   diazepam (VALIUM) 5 MG tablet TAKE 1 TABLET(5 MG) BY MOUTH AT BEDTIME AS NEEDED FOR SLEEP 30 tablet 3   empagliflozin (JARDIANCE) 10 MG TABS tablet Take 1 tablet (10 mg total) by mouth daily. TAKE 1 TABLET(10 MG) BY MOUTH DAILY 90 tablet 3   estradiol (ESTRACE) 0.1 MG/GM vaginal cream Place 1 Applicatorful vaginally at bedtime. 42.5 g 12   glucose blood (FREESTYLE PRECISION NEO TEST) test strip DX: E11.9 Use to check blood sugar daily 100 strip 5   HYDROcodone-acetaminophen (NORCO) 10-325 MG tablet Take 1 tablet by mouth every 6 (six) hours as needed. 120 tablet 0   Lancets (FREESTYLE) lancets Use to check blood sugar daily. 100 each 5  lisinopril (ZESTRIL) 40 MG tablet TAKE 1 TABLET(40 MG) BY MOUTH DAILY 90 tablet 3   simvastatin (ZOCOR) 40 MG tablet Take 1 tablet (40 mg total) by mouth daily at 6 PM. 90 tablet 2   spironolactone (ALDACTONE) 25 MG tablet Take 0.5 tablets (12.5 mg total) by mouth daily. 90 tablet 3   No current facility-administered medications on file prior to visit.     Allergies  Allergen Reactions   Clindamycin/Lincomycin Rash   Doxycycline Other (See Comments)    Makes heart race   Keflex [Cephalexin] Nausea And Vomiting   Other    Sulfonic Acid (3,5-Dibromo-4-H-Ox-Benz)    Adhesive [Tape] Rash   Latex Rash   Sulfonamide Derivatives Nausea And Vomiting   Social History   Socioeconomic History   Marital status: Married    Spouse name: Interior and spatial designer   Number of children: 2   Years of education: 12   Highest education level: 12th grade  Occupational History   Occupation: Advertising copywriter: RETIRED    Comment: Full time  Tobacco Use   Smoking status: Former    Current packs/day: 0.00    Types: Cigarettes    Start date: 01/03/1960    Quit date: 01/02/1994    Years since quitting: 29.7    Passive exposure: Past   Smokeless tobacco: Never  Vaping Use   Vaping  status: Never Used  Substance and Sexual Activity   Alcohol use: No   Drug use: No   Sexual activity: Not Currently    Partners: Male  Other Topics Concern   Not on file  Social History Narrative   One child deceased from homicide at age 18    Social Drivers of Health   Financial Resource Strain: Low Risk  (05/21/2022)   Overall Financial Resource Strain (CARDIA)    Difficulty of Paying Living Expenses: Not hard at all  Food Insecurity: No Food Insecurity (05/21/2022)   Hunger Vital Sign    Worried About Running Out of Food in the Last Year: Never true    Ran Out of Food in the Last Year: Never true  Transportation Needs: No Transportation Needs (05/21/2022)   PRAPARE - Administrator, Civil Service (Medical): No    Lack of Transportation (Non-Medical): No  Physical Activity: Inactive (05/21/2022)   Exercise Vital Sign    Days of Exercise per Week: 0 days    Minutes of Exercise per Session: 0 min  Stress: Stress Concern Present (05/21/2022)   [employer] of Occupational Health - Occupational Stress Questionnaire    Feeling of Stress : To some extent  Social Connections: Moderately Integrated (05/21/2022)   Social Connection and Isolation Panel [NHANES]    Frequency of Communication with Friends and Family: More than three times a week    Frequency of Social Gatherings with Friends and Family: Once a week    Attends Religious Services: 1 to 4 times per year    Active Member of [employer] or Organizations: No    Attends Banker Meetings: Not on file    Marital Status: Married  Catering manager Violence: Not At Risk (05/21/2022)   Humiliation, Afraid, Rape, and Kick questionnaire    Fear of Current or Ex-Partner: No    Emotionally Abused: No    Physically Abused: No    Sexually Abused: No     Review of Systems  All other systems reviewed and are negative.      Objective:   Physical  Exam Vitals reviewed.  Constitutional:      General: She  is not in acute distress.    Appearance: Normal appearance. She is not ill-appearing or toxic-appearing.  Cardiovascular:     Rate and Rhythm: Normal rate and regular rhythm.     Pulses: Normal pulses.     Heart sounds: Normal heart sounds. No murmur heard.    No friction rub. No gallop.  Pulmonary:     Effort: Pulmonary effort is normal. No respiratory distress.     Breath sounds: No stridor. No wheezing, rhonchi or rales.  Abdominal:     General: Abdomen is flat. Bowel sounds are normal. There is no distension.     Palpations: Abdomen is soft.     Tenderness: There is no abdominal tenderness. There is no guarding.  Musculoskeletal:     Cervical back: Neck supple.  Neurological:     General: No focal deficit present.     Mental Status: She is alert and oriented to person, place, and time.     Cranial Nerves: No cranial nerve deficit.     Sensory: No sensory deficit.     Motor: No weakness.     Coordination: Coordination normal.     Gait: Gait normal.     Deep Tendon Reflexes: Reflexes normal.  Psychiatric:        Mood and Affect: Mood normal.        Thought Content: Thought content normal.           Assessment & Plan:  Unintentional weight loss  Hematuria, unspecified type  Obstructive nephropathy  Hydronephrosis of right kidney Based on the results of the CT scan, I believe her hematuria is likely due to to what ever is obstructing the right ureter.  Therefore, I have recommended the patient follow-up with her urologist and they are scheduled for a cystoscopy on Tuesday.  I explained to the patient that this may be malignancy.  Therefore they may need additional surgery to treat the lesion depending on what they find when they do the cystoscopy.  I believe the decline in her renal function is due to the obstruction.  Therefore I recommended that they discontinue lisinopril and spironolactone until the obstruction has been removed or treated.  Due to her recurrent urinary  tract infection, I recommended discontinuation of Jardiance indefinitely.  Complete Cipro prescribed in the emergency room for possible UTI

## 2023-10-03 NOTE — Addendum Note (Signed)
 Addended by: Lynnea Ferrier T on: 10/03/2023 12:01 PM   Modules accepted: Orders

## 2023-10-04 ENCOUNTER — Ambulatory Visit (HOSPITAL_COMMUNITY): Admission: RE | Admit: 2023-10-04 | Source: Ambulatory Visit

## 2023-10-06 ENCOUNTER — Ambulatory Visit: Admitting: Family Medicine

## 2023-10-06 NOTE — Progress Notes (Signed)
 History of Present Illness: Erika Lee is a 78 y.o. year old female here for cystoscopy.  She has had an evaluation for gross and microscopic hematuria.  Urine is have been negative.  Urine cytology was negative for high-grade urothelial carcinoma but fungal organisms and squamous cell contamination were present.  She does have moderate right-sided hydronephrosis on a noncontrast CT scan of the abdomen and pelvis.  Recent contrasted CT of chest, abdomen/pelvis: CT CHEST:  1. No acute intrathoracic abnormality. 2. Multichamber cardiomegaly. 3. Coronary artery calcifications. Assessment for potential risk factor modification, dietary therapy or pharmacologic therapy may be warranted, if clinically indicated.  CT ABDOMEN AND PELVIS:  1. Similar mild-to-moderate right hydroureteronephrosis to the level of the distal right ureter, where there is abrupt cut off and enhancing soft tissue density, suspicious for urothelial neoplasm. Recommend further evaluation with cystoscopy. 2. Dilated rectum contains large volume stool. Moderate to large volume stool within the remainder of the colon. Recommend correlation with constipation. 3. Cholelithiasis.   Past Medical History:  Diagnosis Date   Anticoagulant long-term use    eliquis   Chronic sinusitis    GERD (gastroesophageal reflux disease)    Heart failure with preserved ejection fraction (HCC)    Hemorrhoids    History of colitis 10/2016   campylobacter   Hyperlipidemia    Hypertension    Multinodular goiter    per pt has had 2 biopsy's both benign   NSTEMI (non-ST elevated myocardial infarction) (HCC)    12/2018- normal coronary arteries on cath, poss coronary vasospasm.    Osteoarthritis    "all over"  and [facility] joint left shoulder   Paroxysmal atrial fibrillation [facility]) EP cardiologist--  dr Johney Frame   s/p PVI by Dr Johney Frame 07/16/2013;   pt first dx 2008,  recurrence 2014 afib/flutter and tachy-brady   Rotator cuff tear, left     Shoulder impingement, left    Status post placement of implantable loop recorder    original placement 07-26-2014;  removal / replacement 10-17-2017  by dr allred   Type 2 diabetes mellitus (HCC)    followed by pcp    Past Surgical History:  Procedure Laterality Date   ATRIAL FIBRILLATION ABLATION N/A 07/16/2013   PVI and CTI ablation by Dr Johney Frame   CARDIAC CATHETERIZATION     01/11/2019- Normal coronary arteries.    COLONOSCOPY  09/10/2011   Dr. Lovell Sheehan: normal, 10 year follow up.   implantable loop recorder removal  04/25/2021   MDT LINQ removed by Dr Johney Frame   LEFT HEART CATH AND CORONARY ANGIOGRAPHY N/A 01/11/2019   Procedure: LEFT HEART CATH AND CORONARY ANGIOGRAPHY;  Surgeon: Runell Gess, MD;  Location: [facility] INVASIVE CV LAB;  Service: Cardiovascular;  Laterality: N/A;   LESION REMOVAL N/A 05/03/2013   Procedure: EXCISION CYST, BACK;  Surgeon: Dalia Heading, MD;  Location: [facility];  Service: General;  Laterality: N/A;   LOOP RECORDER IMPLANT N/A 07/26/2014   Procedure: LOOP RECORDER IMPLANT;  Surgeon: Hillis Range, MD;  Location: [facility] CATH LAB;  Service: Cardiovascular;  Laterality: N/A;   LOOP RECORDER INSERTION N/A 10/17/2017   MDT previously implanted ILR for afib management at [facility].  old device removed and new device placed by Dr Johney Frame   LOOP RECORDER REMOVAL N/A 10/17/2017   MDT ILR removed with new device subsequently replaced   SHOULDER ARTHROSCOPY WITH ROTATOR CUFF REPAIR AND SUBACROMIAL DECOMPRESSION Left 01/07/2019   Procedure: Left shoulder subacromial decompression, distal clavicle resection, extensive debridement,;  Surgeon: Aundria Rud,  Noah Delaine, MD;  Location: [facility];  Service: Orthopedics;  Laterality: Left;   TEE WITHOUT CARDIOVERSION N/A 07/15/2013   Procedure: TRANSESOPHAGEAL ECHOCARDIOGRAM (TEE);  Surgeon: Lewayne Bunting, MD;  [facility] ENDOSCOPY;  Service: Cardiovascular;  Laterality: N/A;   TONSILLECTOMY  age 10   TRANSANAL  HEMORRHOIDAL DEARTERIALIZATION N/A 09/22/2015   Procedure: TRANSANAL HEMORRHOIDAL LIGATION/PEXY EUA POSSIBLE HEMORRHOIDECTOMY ;  Surgeon: Karie Soda, MD;  Location: [facility];  Service: General;  Laterality: N/A;    Home Medications:  (Not in a hospital admission)   Allergies:  Allergies  Allergen Reactions   Clindamycin/Lincomycin Rash   Doxycycline Other (See Comments)    Makes heart race   Keflex [Cephalexin] Nausea And Vomiting   Other    Sulfonic Acid (3,5-Dibromo-4-H-Ox-Benz)    Adhesive [Tape] Rash   Latex Rash   Sulfonamide Derivatives Nausea And Vomiting    Family History  Problem Relation Age of Onset   Stroke Mother 65   Stroke Maternal Grandmother        stroke   Other Maternal Grandfather        "heart dropsy" kidney issues   Kidney disease Maternal Grandfather    Colon cancer Neg Hx     Social History:  reports that she quit smoking about 29 years ago. Her smoking use included cigarettes. She started smoking about 63 years ago. She has been exposed to tobacco smoke. She has never used smokeless tobacco. She reports that she does not drink alcohol and does not use drugs.  ROS: A complete review of systems was performed.  All systems are negative except for pertinent findings as noted.  Physical Exam:  Vital signs in last 24 hours: @VSRANGES @ General:  Alert and oriented, No acute distress HEENT: Normocephalic, atraumatic Neck: No JVD or lymphadenopathy Cardiovascular: Regular rate  Lungs: Normal inspiratory/expiratory excursion Abdomen: Soft, nontender, nondistended, no abdominal masses Back: No CVA tenderness Extremities: No edema Neurologic: Grossly intact  I have reviewed prior pt notes  I have reviewed notes from referring/previous physicians  I have reviewed urinalysis results  I have independently reviewed prior imaging  I have reviewed prior urine culture   Indication: Gross hematuria, right hydronephrosis  After informed consent and  discussion of the procedure and its risks, pt was positioned and prepped in the standard fashion.  Cystoscopy was performed with a flexible cystoscope.   Findings:  Urethra: Normal Ureteral orifices: Normally located, configured bilaterally Bladder: Urothelial lesions  Impression/Assessment:  Gross hematuria, right hydronephrosis, right ureteral mass.  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  Plan:  I discussed further management with her.  At this point, I would recommend cystoscopy, retrograde, ureteroscopy/biopsy, stent placement.  She would like to do this in [place].  I will get cardiology clearance for this both from a cardiology and anticoagulant standpoint.  Bertram Millard Fredrica Capano 10/06/2023, 12:45 PM  Bertram Millard. Renesmae Donahey MD

## 2023-10-07 ENCOUNTER — Telehealth: Payer: Self-pay | Admitting: Urology

## 2023-10-07 ENCOUNTER — Ambulatory Visit: Admitting: Urology

## 2023-10-07 ENCOUNTER — Encounter: Payer: Self-pay | Admitting: Gastroenterology

## 2023-10-07 VITALS — BP 158/69 | HR 65

## 2023-10-07 DIAGNOSIS — N2889 Other specified disorders of kidney and ureter: Secondary | ICD-10-CM | POA: Diagnosis not present

## 2023-10-07 DIAGNOSIS — N1330 Unspecified hydronephrosis: Secondary | ICD-10-CM

## 2023-10-07 DIAGNOSIS — R310 Gross hematuria: Secondary | ICD-10-CM

## 2023-10-07 DIAGNOSIS — Z87891 Personal history of nicotine dependence: Secondary | ICD-10-CM

## 2023-10-07 LAB — URINALYSIS, ROUTINE W REFLEX MICROSCOPIC
Bilirubin, UA: NEGATIVE
Ketones, UA: NEGATIVE
Leukocytes,UA: NEGATIVE
Nitrite, UA: NEGATIVE
Protein,UA: NEGATIVE
RBC, UA: NEGATIVE
Specific Gravity, UA: 1.02 (ref 1.005–1.030)
Urobilinogen, Ur: 0.2 mg/dL (ref 0.2–1.0)
pH, UA: 6 (ref 5.0–7.5)

## 2023-10-07 NOTE — Telephone Encounter (Signed)
 Patient called this afternoon to schedule procedure that Dr Retta Diones told her about.  I explained to her that Guss Bunde was not in the office today and that we had to get cardiac clearance before the procedure could be scheduled.  She would like a call asap to get this on the books, she is going to be seeing Dr Lalla Brothers at [facility].

## 2023-10-07 NOTE — Transitions of Care (Post Inpatient/ED Visit) (Signed)
   10/07/2023  Name: Erika Lee MRN: [id number] DOB: Apr 23, 1946  Today's TOC FU Call Status: Today's TOC FU Call Status:: Unsuccessful Call (1st Attempt) Unsuccessful Call (1st Attempt) Date: 10/02/23  Attempted to reach the patient regarding the most recent Inpatient/ED visit.  Follow Up Plan: No further outreach attempts will be made at this time. We have been unable to contact the patient. Patient already seen in office Signature Karena Addison, LPN [facility] Nurse Health Advisor Direct Dial [phone]

## 2023-10-08 ENCOUNTER — Ambulatory Visit (HOSPITAL_COMMUNITY)
Admission: RE | Admit: 2023-10-08 | Discharge: 2023-10-08 | Disposition: A | Discharge status: Home / Self Care | Source: Ambulatory Visit | Attending: Family Medicine | Admitting: Family Medicine

## 2023-10-08 ENCOUNTER — Encounter (HOSPITAL_COMMUNITY): Payer: Self-pay

## 2023-10-08 DIAGNOSIS — R928 Other abnormal and inconclusive findings on diagnostic imaging of breast: Secondary | ICD-10-CM | POA: Insufficient documentation

## 2023-10-08 DIAGNOSIS — R634 Abnormal weight loss: Secondary | ICD-10-CM | POA: Diagnosis not present

## 2023-10-08 DIAGNOSIS — R920 Mammographic microcalcification found on diagnostic imaging of breast: Secondary | ICD-10-CM | POA: Diagnosis not present

## 2023-10-08 DIAGNOSIS — Z1231 Encounter for screening mammogram for malignant neoplasm of breast: Secondary | ICD-10-CM | POA: Insufficient documentation

## 2023-10-09 ENCOUNTER — Other Ambulatory Visit: Payer: Self-pay

## 2023-10-09 ENCOUNTER — Telehealth: Payer: Self-pay | Admitting: Cardiology

## 2023-10-09 ENCOUNTER — Telehealth: Payer: Self-pay

## 2023-10-09 DIAGNOSIS — N2889 Other specified disorders of kidney and ureter: Secondary | ICD-10-CM

## 2023-10-09 NOTE — Telephone Encounter (Signed)
 Pharmacy please advise on holding Eliquis prior to Cystoscopy, right urteroscopy with stent placement and ureteral biopsy  scheduled for 10/22/23. Thank you.

## 2023-10-09 NOTE — Telephone Encounter (Signed)
 Pt hasn't been taking Eliquis since 4/8 because she stated she was supposed to have a cystoscopy with her Urologist soon and knew she needed to hold her Eliquis (pt stated a physician has not officially told her to hold it she just knew from past experiences she has to hold it for surgeries) but she was told today that her surgery has been pushed to 4/23 and wants to know if she should restart Eliquis. Advised pt to restart Eliquis for the time being and we would forward this information to Francis Dowse, PA-C and her covering CMA. Pt verbalized understanding of plan.

## 2023-10-09 NOTE — Telephone Encounter (Signed)
 Pt c/o medication issue:  1. Name of Medication: apixaban (ELIQUIS) 5 MG TABS tablet   2. How are you currently taking this medication (dosage and times per day)? Not taking  3. Are you having a reaction (difficulty breathing--STAT)? No   4. What is your medication issue? Pt stopped taking medication on 4/9 because she was going to have surgery but now surgery has been moved to 4/23. She wants to know if she should start taking it again. Please advise

## 2023-10-09 NOTE — Telephone Encounter (Signed)
 Communication updated in surgery workque.

## 2023-10-09 NOTE — Telephone Encounter (Signed)
   Pre-operative Risk Assessment    Patient Name: Erika Lee  DOB: [birth year] MRN: [id number]   Date of last office visit: 08/04/2023 Date of next office visit: 12/09/2023   Request for Surgical Clearance    Procedure:   Cystoscopy, right urteroscopy with stent placement and ureteral biopsy   Date of Surgery:  Clearance 10/22/23                                 Surgeon:  Dr Marcine Matar Surgeon's Group or Practice Name:  [facility]  Phone number:  [phone] Fax number:  [phone]   Type of Clearance Requested:   - Medical  - Pharmacy:  Hold Apixaban (Eliquis) 3 days    Type of Anesthesia:  General    Additional requests/questions:   n/a  Rondel Baton   10/09/2023, 2:35 PM

## 2023-10-10 ENCOUNTER — Telehealth: Payer: Self-pay | Admitting: *Deleted

## 2023-10-10 NOTE — Telephone Encounter (Signed)
 Shundra Bennett (Key: [id number])  Your information has been sent to [employer].

## 2023-10-10 NOTE — Telephone Encounter (Signed)
 Patient with diagnosis of A Fib on Eliquis for anticoagulation.    Procedure: Cystoscopy, right urteroscopy with stent placement and ureteral biopsy  Date of procedure: 10/22/23   CHA2DS2-VASc Score = 6  This indicates a 9.7% annual risk of stroke. The patient's score is based upon: CHF History: 0 HTN History: 1 Diabetes History: 1 Stroke History: 0 Vascular Disease History: 1 Age Score: 2 Gender Score: 1   CrCl 30 ml/min Platelet count 164K   Per office protocol, patient can hold Eliquis for 3 days prior to procedure.    Patient will not need bridging with Lovenox (enoxaparin) around procedure.  **This guidance is not considered finalized until pre-operative APP has relayed final recommendations.**

## 2023-10-10 NOTE — Telephone Encounter (Signed)
 Pt has been scheduled tele preop appt 10/15/23. Med rec and consent are done.

## 2023-10-10 NOTE — Telephone Encounter (Signed)
   Name: Erika Lee  DOB: 08/22/1945  MRN: [id number]  Primary Cardiologist: Lanier Prude, MD   Preoperative team, please contact this patient and set up a phone call appointment for further preoperative risk assessment. Please obtain consent and complete medication review. Thank you for your help.  I confirm that guidance regarding antiplatelet and oral anticoagulation therapy has been completed and, if necessary, noted below:  Per office protocol, patient can hold Eliquis for 3 days prior to procedure.      I also confirmed the patient resides in the state of [place]. As per [facility] Medical Board telemedicine laws, the patient must reside in the state in which the provider is licensed.   Marcelino Duster, PA 10/10/2023, 9:13 AM [facility]

## 2023-10-10 NOTE — Telephone Encounter (Signed)
 Pt has been scheduled tele preop appt 10/15/23. Med rec and consent are done.      Patient Consent for Virtual Visit        Erika Lee has provided verbal consent on 10/10/2023 for a virtual visit (video or telephone).   CONSENT FOR VIRTUAL VISIT FOR:  Erika Lee  By participating in this virtual visit I agree to the following:  I hereby voluntarily request, consent and authorize [facility] and its employed or contracted physicians, physician assistants, nurse practitioners or other licensed health care professionals (the Practitioner), to provide me with telemedicine health care services (the "Services") as deemed necessary by the treating Practitioner. I acknowledge and consent to receive the Services by the Practitioner via telemedicine. I understand that the telemedicine visit will involve communicating with the Practitioner through live audiovisual communication technology and the disclosure of certain medical information by electronic transmission. I acknowledge that I have been given the opportunity to request an in-person assessment or other available alternative prior to the telemedicine visit and am voluntarily participating in the telemedicine visit.  I understand that I have the right to withhold or withdraw my consent to the use of telemedicine in the course of my care at any time, without affecting my right to future care or treatment, and that the Practitioner or I may terminate the telemedicine visit at any time. I understand that I have the right to inspect all information obtained and/or recorded in the course of the telemedicine visit and may receive copies of available information for a reasonable fee.  I understand that some of the potential risks of receiving the Services via telemedicine include:  Delay or interruption in medical evaluation due to technological equipment failure or disruption; Information transmitted may not be sufficient (e.g. poor resolution  of images) to allow for appropriate medical decision making by the Practitioner; and/or  In rare instances, security protocols could fail, causing a breach of personal health information.  Furthermore, I acknowledge that it is my responsibility to provide information about my medical history, conditions and care that is complete and accurate to the best of my ability. I acknowledge that Practitioner's advice, recommendations, and/or decision may be based on factors not within their control, such as incomplete or inaccurate data provided by me or distortions of diagnostic images or specimens that may result from electronic transmissions. I understand that the practice of medicine is not an exact science and that Practitioner makes no warranties or guarantees regarding treatment outcomes. I acknowledge that a copy of this consent can be made available to me via my patient portal [facility] MyChart), or I can request a printed copy by calling the office of  [facility].    I understand that my insurance will be billed for this visit.   I have read or had this consent read to me. I understand the contents of this consent, which adequately explains the benefits and risks of the Services being provided via telemedicine.  I have been provided ample opportunity to ask questions regarding this consent and the Services and have had my questions answered to my satisfaction. I give my informed consent for the services to be provided through the use of telemedicine in my medical care

## 2023-10-10 NOTE — Telephone Encounter (Signed)
 Spoke with pt and confirmed with pt she should restart Eliquis and continue until she is advised by urology to hold it again.  Pt verbalizes understanding and agrees with current plan.

## 2023-10-13 ENCOUNTER — Other Ambulatory Visit: Payer: Self-pay

## 2023-10-13 ENCOUNTER — Other Ambulatory Visit (HOSPITAL_COMMUNITY): Payer: Self-pay | Admitting: Family Medicine

## 2023-10-13 ENCOUNTER — Telehealth: Payer: Self-pay | Admitting: Urology

## 2023-10-13 DIAGNOSIS — R928 Other abnormal and inconclusive findings on diagnostic imaging of breast: Secondary | ICD-10-CM

## 2023-10-13 DIAGNOSIS — E118 Type 2 diabetes mellitus with unspecified complications: Secondary | ICD-10-CM

## 2023-10-13 MED ORDER — FREESTYLE PRECISION NEO TEST VI STRP: ORAL_STRIP | 5 refills | Status: DC

## 2023-10-13 NOTE — Telephone Encounter (Signed)
 Called Pt to ask her what questions she had about her upcoming surgery lvm to c/b

## 2023-10-13 NOTE — Telephone Encounter (Signed)
 Pt c/b stating she no questions for her upcoming surgery and is not sure if let a message stating she did

## 2023-10-13 NOTE — Telephone Encounter (Signed)
 Keyli Scull (Key: [id number]) PA Case ID #: [id number] Need Help? Call us  at [phone] Outcome Denied on April 11 by [employer] 2017 This request was denied under your Medicare Part D benefit; however, coverage/payment for the requested drug(s) has been APPROVED under Medicare Part B as long as your doctor continues to prescribe the drug for you, and it continues to be safe an Drug FreeStyle Precision Neo Test strips ePA cloud logo Form [employer] Medicare Electronic Prior Authorization Form 2017 [place]CP[place]

## 2023-10-13 NOTE — Telephone Encounter (Signed)
 Patient left message she has a question for the Dr before her upcoming surgery

## 2023-10-15 ENCOUNTER — Ambulatory Visit: Attending: Nurse Practitioner

## 2023-10-15 DIAGNOSIS — Z01810 Encounter for preprocedural cardiovascular examination: Secondary | ICD-10-CM

## 2023-10-15 NOTE — Progress Notes (Signed)
 Virtual Visit via Telephone Note   Because of Erika Lee co-morbid illnesses, she is at least at moderate risk for complications without adequate follow up.  This format is felt to be most appropriate for this patient at this time.  Due to technical limitations with video connection (technology), today's appointment will be conducted as an audio only telehealth visit, and Erika Lee verbally agreed to proceed in this manner.   All issues noted in this document were discussed and addressed.  No physical exam could be performed with this format.  Evaluation Performed:  Preoperative cardiovascular risk assessment _____________   Date:  10/15/2023   Patient ID:  Erika Lee, DOB Apr 24, 1946, MRN [id number] Patient Location:  Home Provider location:   Office  Primary Care Provider:  Donita Brooks, MD Primary Cardiologist:  Lanier Prude, MD  Chief Complaint / Patient Profile   78 y.o. y/o female with a h/o DM, GERD, HTN, AFlutter/AFib (s/p CTI and PVI ablation 2015), July 2020 suffered NSTEMI > echo noted preserved LVEF with new WMA > cath with normal coronaries  who is pending cystoscopy with right uteroscopy stent placement with ureteral biopsy and presents today for telephonic preoperative cardiovascular risk assessment.  History of Present Illness    Erika Lee is a 78 y.o. female who presents via audio/video conferencing for a telehealth visit today.  Pt was last seen in cardiology clinic on 08/04/2023 by Francis Dowse, PA.  At that time Erika Lee was doing well with no chest pain with fatigue but no shortness of breath.  She noted some lower extremity edema that improves with compression stockings.  She was seen in the ED on 10/01/23 with complaint of weakness and lightheadedness in the setting of poor p.o. intake after losing her husband.  She was treated with IV fluids with reassuring workup and later discharged. The patient is now pending procedure as outlined above.  Since her last visit, she has been doing better and eating and drinking daily.  Her blood pressure has been stable and she reports no cardiac complaints since her previous visit.  She denies chest pain, shortness of breath, lower extremity edema, fatigue, palpitations, melena, hematuria, hemoptysis, diaphoresis, weakness, presyncope, syncope, orthopnea, and PND.   Past Medical History    Past Medical History:  Diagnosis Date   Anticoagulant long-term use    eliquis   Chronic sinusitis    GERD (gastroesophageal reflux disease)    Heart failure with preserved ejection fraction (HCC)    Hemorrhoids    History of colitis 10/2016   campylobacter   Hyperlipidemia    Hypertension    Multinodular goiter    per pt has had 2 biopsy's both benign   NSTEMI (non-ST elevated myocardial infarction) (HCC)    12/2018- normal coronary arteries on cath, poss coronary vasospasm.    Osteoarthritis    "all over"  and [facility] joint left shoulder   Paroxysmal atrial fibrillation [facility]) EP cardiologist--  dr Johney Frame   s/p PVI by Dr Johney Frame 07/16/2013;   pt first dx 2008,  recurrence 2014 afib/flutter and tachy-brady   Rotator cuff tear, left    Shoulder impingement, left    Status post placement of implantable loop recorder    original placement 07-26-2014;  removal / replacement 10-17-2017  by dr allred   Type 2 diabetes mellitus (HCC)    followed by pcp   Past Surgical History:  Procedure Laterality Date   ATRIAL FIBRILLATION ABLATION N/A 07/16/2013  PVI and CTI ablation by Dr Nunzio Belch   CARDIAC CATHETERIZATION     01/11/2019- Normal coronary arteries.    COLONOSCOPY  09/10/2011   Dr. Larrie Po: normal, 10 year follow up.   implantable loop recorder removal  04/25/2021   MDT LINQ removed by Dr Nunzio Belch   LEFT HEART CATH AND CORONARY ANGIOGRAPHY N/A 01/11/2019   Procedure: LEFT HEART CATH AND CORONARY ANGIOGRAPHY;  Surgeon: Avanell Leigh, MD;  Location: [facility] INVASIVE CV LAB;  Service: Cardiovascular;   Laterality: N/A;   LESION REMOVAL N/A 05/03/2013   Procedure: EXCISION CYST, BACK;  Surgeon: Beau Bound, MD;  Location: [facility] ORS;  Service: General;  Laterality: N/A;   LOOP RECORDER IMPLANT N/A 07/26/2014   Procedure: LOOP RECORDER IMPLANT;  Surgeon: Jolly Needle, MD;  Location: [facility] CATH LAB;  Service: Cardiovascular;  Laterality: N/A;   LOOP RECORDER INSERTION N/A 10/17/2017   MDT previously implanted ILR for afib management at [facility].  old device removed and new device placed by Dr Nunzio Belch   LOOP RECORDER REMOVAL N/A 10/17/2017   MDT ILR removed with new device subsequently replaced   SHOULDER ARTHROSCOPY WITH ROTATOR CUFF REPAIR AND SUBACROMIAL DECOMPRESSION Left 01/07/2019   Procedure: Left shoulder subacromial decompression, distal clavicle resection, extensive debridement,;  Surgeon: Janeth Medicus, MD;  Location: [facility];  Service: Orthopedics;  Laterality: Left;   TEE WITHOUT CARDIOVERSION N/A 07/15/2013   Procedure: TRANSESOPHAGEAL ECHOCARDIOGRAM (TEE);  Surgeon: Lenise Quince, MD;  Location: [facility] ENDOSCOPY;  Service: Cardiovascular;  Laterality: N/A;   TONSILLECTOMY  age 34   TRANSANAL HEMORRHOIDAL DEARTERIALIZATION N/A 09/22/2015   Procedure: TRANSANAL HEMORRHOIDAL LIGATION/PEXY EUA POSSIBLE HEMORRHOIDECTOMY ;  Surgeon: Candyce Champagne, MD;  Location: [facility] ORS;  Service: General;  Laterality: N/A;    Allergies  Allergies  Allergen Reactions   Clindamycin/Lincomycin Rash   Doxycycline Other (See Comments)    Makes heart race   Keflex [Cephalexin] Nausea And Vomiting   Other    Sulfonic Acid (3,5-Dibromo-4-H-Ox-Benz)    Adhesive [Tape] Rash   Latex Rash   Sulfonamide Derivatives Nausea And Vomiting    Home Medications    Prior to Admission medications   Medication Sig Start Date End Date Taking? Authorizing Provider  amLODipine (NORVASC) 5 MG tablet Take 1 tablet (5 mg total) by mouth daily. 09/22/23   Austine Lefort, MD  apixaban (ELIQUIS) 5 MG  TABS tablet Take 1 tablet (5 mg total) by mouth 2 (two) times daily. 06/12/23   Austine Lefort, MD  azelastine (ASTELIN) 0.1 % nasal spray Place 1 spray into both nostrils 2 (two) times daily. Use in each nostril as directed- helps with postnasal drip 09/03/23   Amadeo June, MD  benzonatate (TESSALON PERLES) 100 MG capsule Take 1 capsule (100 mg total) by mouth 3 (three) times daily as needed. Patient not taking: Reported on 10/10/2023 09/03/23   Aguiar, Rafaela, MD  ciprofloxacin (CIPRO) 500 MG tablet Take 1 tablet (500 mg total) by mouth every 12 (twelve) hours. Patient not taking: Reported on 10/10/2023 10/01/23   Carin Charleston, MD  diazepam (VALIUM) 5 MG tablet TAKE 1 TABLET(5 MG) BY MOUTH AT BEDTIME AS NEEDED FOR SLEEP 10/03/23   Austine Lefort, MD  estradiol (ESTRACE) 0.1 MG/GM vaginal cream Place 1 Applicatorful vaginally at bedtime. 08/08/23   Austine Lefort, MD  glucose blood (FREESTYLE PRECISION NEO TEST) test strip DX: E11.9 Use to check blood sugar daily 10/13/23   Austine Lefort, MD  HYDROcodone-acetaminophen [facility])  10-325 MG tablet Take 1 tablet by mouth every 6 (six) hours as needed. 10/03/23   Austine Lefort, MD  Lancets (FREESTYLE) lancets Use to check blood sugar daily. 12/31/22   Austine Lefort, MD  lisinopril (ZESTRIL) 40 MG tablet TAKE 1 TABLET(40 MG) BY MOUTH DAILY 08/04/23   Austine Lefort, MD  simvastatin (ZOCOR) 40 MG tablet Take 1 tablet (40 mg total) by mouth daily at 6 PM. 06/12/23   Austine Lefort, MD  spironolactone (ALDACTONE) 25 MG tablet Take 0.5 tablets (12.5 mg total) by mouth daily. 06/12/23   Austine Lefort, MD    Physical Exam    Vital Signs:  Erika Lee does not have vital signs available for review today.  Given telephonic nature of communication, physical exam is limited. AAOx3. NAD. Normal affect.  Speech and respirations are unlabored.  Accessory Clinical Findings    None  Assessment & Plan    1.  Preoperative Cardiovascular Risk  Assessment: - Patient's RCRI score is 6.6%  The patient affirms she has been doing well without any new cardiac symptoms. They are able to achieve 6 METS without cardiac limitations. Therefore, based on ACC/AHA guidelines, the patient would be at acceptable risk for the planned procedure without further cardiovascular testing. The patient was advised that if she develops new symptoms prior to surgery to contact our office to arrange for a follow-up visit, and she verbalized understanding.   The patient was advised that if she develops new symptoms prior to surgery to contact our office to arrange for a follow-up visit, and she verbalized understanding.   Per office protocol, patient can hold Eliquis for 3 days prior to procedure.    A copy of this note will be routed to requesting surgeon.  Time:   Today, I have spent 6 minutes with the patient with telehealth technology discussing medical history, symptoms, and management plan.     Francene Ing, Retha Cast, NP  10/15/2023, 7:50 AM

## 2023-10-20 NOTE — Patient Instructions (Signed)
 SURGICAL WAITING ROOM VISITATION Patients having surgery or a procedure may have no more than 2 support people in the waiting area - these visitors may rotate in the visitor waiting room.   If the patient needs to stay at the hospital during part of their recovery, the visitor guidelines for inpatient rooms apply.  PRE-OP VISITATION  Pre-op nurse will coordinate an appropriate time for 1 support person to accompany the patient in pre-op.  This support person may not rotate.  This visitor will be contacted when the time is appropriate for the visitor to come back in the pre-op area.  Please refer to the [facility] website for the visitor guidelines for Inpatients (after your surgery is over and you are in a regular room).  You are not required to quarantine at this time prior to your surgery. However, you must do this: Hand Hygiene often Do NOT share personal items Notify your provider if you are in close contact with someone who has COVID or you develop fever 100.4 or greater, new onset of sneezing, cough, sore throat, shortness of breath or body aches.  If you test positive for Covid or have been in contact with anyone that has tested positive in the last 10 days please notify you surgeon.    Your procedure is scheduled on:  October 22, 2023  Report to [facility] Main Entrance: [address] entrance where the [employer] is available.   Report to admitting at 05:15  AM  Call this number if you have any questions or problems the morning of surgery [phone]  DO NOT EAT OR DRINK ANYTHING AFTER MIDNIGHT THE NIGHT PRIOR TO YOUR SURGERY / PROCEDURE.   FOLLOW  ANY ADDITIONAL PRE OP INSTRUCTIONS YOU RECEIVED FROM YOUR SURGEON'S OFFICE!!!   Oral Hygiene is also important to reduce your risk of infection.        Remember - BRUSH YOUR TEETH THE MORNING OF SURGERY WITH YOUR REGULAR TOOTHPASTE  Do NOT smoke after Midnight the night before surgery.  STOP TAKING all Vitamins, Herbs and  supplements 1 week before your surgery.   Take ONLY these medicines the morning of surgery with A SIP OF WATER : amlodipine  and you may use your Astelin  nasal spray if needed   You may not have any metal on your body including hair pins, jewelry, and body piercing  Do not wear make-up, lotions, powders, perfumes or deodorant  Do not wear nail polish including gel and S&S, artificial / acrylic nails, or any other type of covering on natural nails including finger and toenails. If you have artificial nails, gel coating, etc., that needs to be removed by a nail salon, Please have this removed prior to surgery. Not doing so may mean that your surgery could be cancelled or delayed if the Surgeon or anesthesia staff feels like they are unable to monitor you safely.   Do not shave 48 hours prior to surgery to avoid nicks in your skin which may contribute to postoperative infections.   Contacts, Hearing Aids, dentures or bridgework may not be worn into surgery. DENTURES WILL BE REMOVED PRIOR TO SURGERY PLEASE DO NOT APPLY "Poly grip" OR ADHESIVES!!!  Patients discharged on the day of surgery will not be allowed to drive home.  Someone NEEDS to stay with you for the first 24 hours after anesthesia.  Do not bring your home medications to the hospital. The Pharmacy will dispense medications listed on your medication list to you during your admission in the  Hospital.  Please read over the following fact sheets you were given: IF YOU HAVE QUESTIONS ABOUT YOUR PRE-OP INSTRUCTIONS, PLEASE CALL [phone].   [place] - Preparing for Surgery Before surgery, you can play an important role.  Because skin is not sterile, your skin needs to be as free of germs as possible.  You can reduce the number of germs on your skin by washing with CHG (chlorahexidine gluconate) soap before surgery.  CHG is an antiseptic cleaner which kills germs and bonds with the skin to continue killing germs even after washing. Please  DO NOT use if you have an allergy to CHG or antibacterial soaps.  If your skin becomes reddened/irritated stop using the CHG and inform your nurse when you arrive at Short Stay. Do not shave (including legs and underarms) for at least 48 hours prior to the first CHG shower.  You may shave your face/neck.  Please follow these instructions carefully:  1.  Shower with CHG Soap the night before surgery and the  morning of surgery.  2.  If you choose to wash your hair, wash your hair first as usual with your normal  shampoo.  3.  After you shampoo, rinse your hair and body thoroughly to remove the shampoo.                             4.  Use CHG as you would any other liquid soap.  You can apply chg directly to the skin and wash.  Gently with a scrungie or clean washcloth.  5.  Apply the CHG Soap to your body ONLY FROM THE NECK DOWN.   Do not use on face/ open                           Wound or open sores. Avoid contact with eyes, ears mouth and genitals (private parts).                       Wash face,  Genitals (private parts) with your normal soap.             6.  Wash thoroughly, paying special attention to the area where your  surgery  will be performed.  7.  Thoroughly rinse your body with warm water  from the neck down.  8.  DO NOT shower/wash with your normal soap after using and rinsing off the CHG Soap.            9.  Pat yourself dry with a clean towel.            10.  Wear clean pajamas.            11.  Place clean sheets on your bed the night of your first shower and do not  sleep with pets.  ON THE DAY OF SURGERY : Do not apply any lotions/deodorants the morning of surgery.  Please wear clean clothes to the hospital/surgery center.    FAILURE TO FOLLOW THESE INSTRUCTIONS MAY RESULT IN THE CANCELLATION OF YOUR SURGERY  PATIENT SIGNATURE_________________________________  NURSE  SIGNATURE__________________________________  ________________________________________________________________________

## 2023-10-20 NOTE — Progress Notes (Signed)
 COVID Vaccine received:  []  No [x]  Yes Date of any COVID positive Test in last 90 days:  PCP - Eliane Grooms, MD  Cardiologist - Harvie Liner MD ,  Charles Connor, Marieta Shorten  NP cardiac clearance in 4-16- phone note  Chest x-ray - 07-23-2021  2v  Epic EKG -  10-01-2023  Epic Stress Test -  ECHO - 05-09-2022   Epic Cardiac Cath - 01-11-2019  [facility] by Dr. Blair Bumpers monitor  06-19-2023  Epic  Bowel Prep - [x]  No  []   Yes ______  Pacemaker / ICD device [x]  No []  Yes   Spinal Cord Stimulator:[x]  No []  Yes       History of Sleep Apnea? [x]  No []  Yes   CPAP used?- [x]  No []  Yes    Does the patient monitor blood sugar?   []  N/A   []  No []  Yes  Patient has: []  NO Hx DM   []  Pre-DM   []  DM1  [x]   DM2 Last A1c was:  6.3 on  09-22-23    Does patient have a [employer] or Dexcom? []  No []  Yes   Fasting Blood Sugar Ranges-  Checks Blood Sugar _____ times a day  Blood Thinner / Instructions:  Eliquis   Hold x 3 days  last dose was taken on  Aspirin  Instructions:  ERAS Protocol Ordered: [x]  No  []  Yes Patient is to be NPO after: midnight  Dental hx: []  Dentures:  []  N/A      []  Bridge or Partial:                   []  Loose or Damaged teeth:   Comments:   Activity level: Patient is able / unable to climb a flight of stairs without difficulty; []  No CP  []  No SOB, but would have ___   Patient can / can not perform ADLs without assistance.   Anesthesia review: HTN, A.fib, GERD, CAD- NSTEMI vs vasospasm 12-2018, HFpEF,   Patient denies shortness of breath, fever, cough and chest pain at PAT appointment.  Patient verbalized understanding and agreement to the Pre-Surgical Instructions that were given to them at this PAT appointment. Patient was also educated of the need to review these PAT instructions again prior to her surgery.I reviewed the appropriate phone numbers to call if they have any and questions or concerns.

## 2023-10-21 ENCOUNTER — Encounter (HOSPITAL_COMMUNITY)
Admission: RE | Admit: 2023-10-21 | Discharge: 2023-10-21 | Disposition: A | Discharge status: Home / Self Care | Source: Ambulatory Visit | Attending: Urology | Admitting: Urology

## 2023-10-21 ENCOUNTER — Other Ambulatory Visit: Payer: Self-pay

## 2023-10-21 ENCOUNTER — Encounter (HOSPITAL_COMMUNITY): Payer: Self-pay

## 2023-10-21 VITALS — BP 140/60 | HR 52 | Temp 98.2°F | Resp 14 | Ht 63.5 in | Wt 130.0 lb

## 2023-10-21 DIAGNOSIS — Z8614 Personal history of Methicillin resistant Staphylococcus aureus infection: Secondary | ICD-10-CM | POA: Insufficient documentation

## 2023-10-21 DIAGNOSIS — Z01818 Encounter for other preprocedural examination: Secondary | ICD-10-CM | POA: Insufficient documentation

## 2023-10-21 DIAGNOSIS — E1165 Type 2 diabetes mellitus with hyperglycemia: Secondary | ICD-10-CM | POA: Diagnosis not present

## 2023-10-21 HISTORY — DX: Cardiac arrhythmia, unspecified: I49.9

## 2023-10-21 HISTORY — DX: Chronic kidney disease, unspecified: N18.9

## 2023-10-21 LAB — GLUCOSE, CAPILLARY: Glucose-Capillary: 100 mg/dL — ABNORMAL HIGH (ref 70–99)

## 2023-10-21 LAB — SURGICAL PCR SCREEN
MRSA, PCR: NEGATIVE
Staphylococcus aureus: NEGATIVE

## 2023-10-21 MED ORDER — GENTAMICIN SULFATE 40 MG/ML IJ SOLN
5.0000 mg/kg | INTRAVENOUS | Status: AC
  Administered 2023-10-22: 300 mg via INTRAVENOUS
  Filled 2023-10-21: qty 7.5

## 2023-10-21 NOTE — H&P (Signed)
 H&P  Chief Complaint: Right ureteral mass with hydronephrosis  History of Present Illness: Erika Lee is a 78 y.o. year old female presenting for cystoscopy, right retrograde ureteropyelogram, right ureteroscopy with biopsy of right ureteral mass with eventual stent placement.  She has a right ureteral mass which was found upon evaluation for gross hematuria.  Past Medical History:  Diagnosis Date   Anticoagulant long-term use    eliquis    Chronic sinusitis    GERD (gastroesophageal reflux disease)    Heart failure with preserved ejection fraction (HCC)    Hemorrhoids    History of colitis 10/2016   campylobacter   Hyperlipidemia    Hypertension    Multinodular goiter    per pt has had 2 biopsy's both benign   NSTEMI (non-ST elevated myocardial infarction) (HCC)    12/2018- normal coronary arteries on cath, poss coronary vasospasm.    Osteoarthritis    "all over"  and [facility] joint left shoulder   Paroxysmal atrial fibrillation [facility]) EP cardiologist--  dr Nunzio Belch   s/p PVI by Dr Nunzio Belch 07/16/2013;   pt first dx 2008,  recurrence 2014 afib/flutter and tachy-brady   Rotator cuff tear, left    Shoulder impingement, left    Status post placement of implantable loop recorder    original placement 07-26-2014;  removal / replacement 10-17-2017  by dr allred   Type 2 diabetes mellitus (HCC)    followed by pcp    Past Surgical History:  Procedure Laterality Date   ATRIAL FIBRILLATION ABLATION N/A 07/16/2013   PVI and CTI ablation by Dr Nunzio Belch   CARDIAC CATHETERIZATION     01/11/2019- Normal coronary arteries.    COLONOSCOPY  09/10/2011   Dr. Larrie Po: normal, 10 year follow up.   implantable loop recorder removal  04/25/2021   MDT LINQ removed by Dr Nunzio Belch   LEFT HEART CATH AND CORONARY ANGIOGRAPHY N/A 01/11/2019   Procedure: LEFT HEART CATH AND CORONARY ANGIOGRAPHY;  Surgeon: Avanell Leigh, MD;  [facility];  Service: Cardiovascular;  Laterality: N/A;   LESION  REMOVAL N/A 05/03/2013   Procedure: EXCISION CYST, BACK;  Surgeon: Beau Bound, MD;  Location: [facility];  Service: General;  Laterality: N/A;   LOOP RECORDER IMPLANT N/A 07/26/2014   Procedure: LOOP RECORDER IMPLANT;  Surgeon: Jolly Needle, MD;  Location: [facility] CATH LAB;  Service: Cardiovascular;  Laterality: N/A;   LOOP RECORDER INSERTION N/A 10/17/2017   MDT previously implanted ILR for afib management at [facility].  old device removed and new device placed by Dr Nunzio Belch   LOOP RECORDER REMOVAL N/A 10/17/2017   MDT ILR removed with new device subsequently replaced   SHOULDER ARTHROSCOPY WITH ROTATOR CUFF REPAIR AND SUBACROMIAL DECOMPRESSION Left 01/07/2019   Procedure: Left shoulder subacromial decompression, distal clavicle resection, extensive debridement,;  Surgeon: Janeth Medicus, MD;  Location: [facility];  Service: Orthopedics;  Laterality: Left;   TEE WITHOUT CARDIOVERSION N/A 07/15/2013   Procedure: TRANSESOPHAGEAL ECHOCARDIOGRAM (TEE);  Surgeon: Lenise Quince, MD;  Location: [facility] ENDOSCOPY;  Service: Cardiovascular;  Laterality: N/A;   TONSILLECTOMY  age 36   TRANSANAL HEMORRHOIDAL DEARTERIALIZATION N/A 09/22/2015   Procedure: TRANSANAL HEMORRHOIDAL LIGATION/PEXY EUA POSSIBLE HEMORRHOIDECTOMY ;  Surgeon: Candyce Champagne, MD;  Location: [facility];  Service: General;  Laterality: N/A;    Home Medications:  No medications prior to admission.    Allergies:  Allergies  Allergen Reactions   Clindamycin /Lincomycin Rash   Doxycycline Other (See Comments)  Makes heart race   Keflex  [Cephalexin ] Nausea And Vomiting   Other    Sulfonic Acid (3,5-Dibromo-4-H-Ox-Benz)    Adhesive [Tape] Rash   Latex Rash   Sulfonamide Derivatives Nausea And Vomiting    Family History  Problem Relation Age of Onset   Stroke Mother 57   Stroke Maternal Grandmother        stroke   Other Maternal Grandfather        "heart dropsy" kidney issues   Kidney disease Maternal Grandfather     Colon cancer Neg Hx     Social History:  reports that she quit smoking about 29 years ago. Her smoking use included cigarettes. She started smoking about 63 years ago. She has been exposed to tobacco smoke. She has never used smokeless tobacco. She reports that she does not drink alcohol and does not use drugs.  ROS: A complete review of systems was performed.  All systems are negative except for pertinent findings as noted.  Physical Exam:  Vital signs in last 24 hours:   General:  Alert and oriented, No acute distress HEENT: Normocephalic, atraumatic Neck: No JVD or lymphadenopathy Cardiovascular: Regular rate  Lungs: Normal inspiratory/expiratory excursion Abdomen: Soft, nontender, nondistended, no abdominal masses Back: No CVA tenderness Extremities: No edema Neurologic: Grossly intact  I have reviewed prior pt notes   I have reviewed urinalysis results  I have independently reviewed prior imaging  I have reviewed prior urine culture   Impression/Assessment:  Right ureteral mass with hydronephrosis  Plan:  Cystoscopy, right retrograde ureteropyelogram, right ureteroscopy with probable biopsy of right ureteral mass, placement of right ureteral stent  Erika Lee Undrea Archbold 10/21/2023, 12:25 PM  Erika Lee. Erika Testerman MD

## 2023-10-21 NOTE — Anesthesia Preprocedure Evaluation (Addendum)
 Anesthesia Evaluation  Patient identified by MRN, date of birth, ID band Patient awake    Reviewed: Allergy & Precautions, NPO status , Patient's Chart, lab work & pertinent test results  History of Anesthesia Complications Negative for: history of anesthetic complications  Airway Mallampati: II  TM Distance: >3 FB Neck ROM: Full   Comment: Previous grade I view with Miller 2, easy mask Dental  (+) Upper Dentures, Lower Dentures   Pulmonary neg shortness of breath, neg sleep apnea, neg COPD, neg recent URI, former smoker   Pulmonary exam normal breath sounds clear to auscultation       Cardiovascular hypertension (amlodipine , lisinopril , spironolactone ), Pt. on medications (-) angina + Past MI (NSTEMI 12/2018) and +CHF  (-) Cardiac Stents and (-) CABG + dysrhythmias (tachycardia-bradycardia, RBBB) Atrial Fibrillation  Rhythm:Regular Rate:Normal  HLD, implantable loop recorder  TTE 05/09/2022: IMPRESSIONS    1. Left ventricular ejection fraction, by estimation, is 65 to 70%. Left  ventricular ejection fraction by 3D volume is 70 %. The left ventricle has  normal function. The left ventricle has no regional wall motion  abnormalities. Left ventricular diastolic   parameters are consistent with Grade I diastolic dysfunction (impaired  relaxation).   2. Right ventricular systolic function is normal. The right ventricular  size is normal. There is normal pulmonary artery systolic pressure. The  estimated right ventricular systolic pressure is 31.5 mmHg.   3. Left atrial size was moderately dilated.   4. Right atrial size was moderately dilated.   5. The mitral valve is normal in structure. Trivial mitral valve  regurgitation. No evidence of mitral stenosis.   6. The aortic valve is tricuspid. Aortic valve regurgitation is trivial.  No aortic stenosis is present.   7. The inferior vena cava is normal in size with greater than 50%   respiratory variability, suggesting right atrial pressure of 3 mmHg.     Neuro/Psych negative neurological ROS     GI/Hepatic Neg liver ROS,GERD  ,,  Endo/Other  diabetes, Type 2  Multinodular goiter  Renal/GU CRFRenal disease   Right ureteral mass    Musculoskeletal  (+) Arthritis , Osteoarthritis,    Abdominal   Peds  Hematology negative hematology ROS (+) Lab Results      Component                Value               Date                      WBC                      8.3                 10/01/2023                HGB                      14.5                10/01/2023                HCT                      43.8                10/01/2023  MCV                      89.6                10/01/2023                PLT                      164                 10/01/2023              Anesthesia Other Findings Last Eliquis : 10/18/2023  Reproductive/Obstetrics                             Anesthesia Physical Anesthesia Plan  ASA: 3  Anesthesia Plan: General   Post-op Pain Management: Tylenol  PO (pre-op)*   Induction: Intravenous  PONV Risk Score and Plan: 3 and Ondansetron , Dexamethasone  and Treatment may vary due to age or medical condition  Airway Management Planned: LMA  Additional Equipment:   Intra-op Plan:   Post-operative Plan: Extubation in OR  Informed Consent: I have reviewed the patients History and Physical, chart, labs and discussed the procedure including the risks, benefits and alternatives for the proposed anesthesia with the patient or authorized representative who has indicated his/her understanding and acceptance.     Dental advisory given  Plan Discussed with: CRNA and Anesthesiologist  Anesthesia Plan Comments: (Risks of general anesthesia discussed including, but not limited to, sore throat, hoarse voice, chipped/damaged teeth, injury to vocal cords, nausea and vomiting, allergic reactions, lung  infection, heart attack, stroke, and death. All questions answered. )        Anesthesia Quick Evaluation

## 2023-10-21 NOTE — Progress Notes (Signed)
 Case: [id number] Date/Time: 10/22/23 0720   Procedures:      CYSTOSCOPY, WITH RETROGRADE PYELOGRAM AND URETERAL STENT INSERTION (Right)     BIOPSY, URETER (Right)   Anesthesia type: General   Diagnosis: Mass of ureter [N28.89]   Pre-op diagnosis: right ureteral mass   [facility] ROOM 08 / [facility]   Surgeons: Trent Frizzle, MD       DISCUSSION: Erika Lee is a 78 yo female who presents to PAT prior to surgery above. PMH of former smoking, HTN, hx of NSTEMI (2020), HFpEF, PAF on Eliquis , L carotid aneurysm, GERD, T2DM (A1c 6.3), CKD, arthritis, right ureteral mass  Patient has been losing weight and w/u revealed a right ureteral mass. She is now scheduled for surgery above.  Patient follows with Cardiology (EP) for PAF on Eliquis . She had an ablation in 2015 but remains on anticoagulation due to high chads2vasc score. Also with hx of NSTEMI in 2020 however cath showed normal coronaries and it was thought to be due to coronary vasospasm. Last seen in clinic on 08/04/23. Stable at that visit. Had cardiac clearance tele visit on 4/16 and cleared:  "Preoperative Cardiovascular Risk Assessment: - Patient's RCRI score is 6.6%   The patient affirms she has been doing well without any new cardiac symptoms. They are able to achieve 6 METS without cardiac limitations. Therefore, based on ACC/AHA guidelines, the patient would be at acceptable risk for the planned procedure without further cardiovascular testing. The patient was advised that if she develops new symptoms prior to surgery to contact our office to arrange for a follow-up visit, and she verbalized understanding.  The patient was advised that if she develops new symptoms prior to surgery to contact our office to arrange for a follow-up visit, and she verbalized understanding. Per office protocol, patient can hold Eliquis  for 3 days prior to procedure."  LD Eliquis : 4/19 at night  VS: BP (!) 140/60 Comment: right arm sitting  Pulse (!)  52   Temp 36.8 C (Oral)   Resp 14   Ht 5' 3.5" (1.613 m)   Wt 59 kg   LMP  (LMP Unknown)   SpO2 96%   BMI 22.67 kg/m   PROVIDERS: Austine Lefort, MD   LABS: Labs reviewed: Acceptable for surgery. Labs from ED on 4/2 show mildly worsening kidney function, possibly from obstruction.  (all labs ordered are listed, but only abnormal results are displayed)  Labs Reviewed  GLUCOSE, CAPILLARY - Abnormal; Notable for the following components:      Result Value   Glucose-Capillary 100 (*)    All other components within normal limits  SURGICAL PCR SCREEN     IMAGES: CT Chest/Abdomen/Pelvis 09/30/23:  IMPRESSION: CT CHEST:   1. No acute intrathoracic abnormality. 2. Multichamber cardiomegaly. 3. Coronary artery calcifications. Assessment for potential risk factor modification, dietary therapy or pharmacologic therapy may be warranted, if clinically indicated.   CT ABDOMEN AND PELVIS:   1. Similar mild-to-moderate right hydroureteronephrosis to the level of the distal right ureter, where there is abrupt cut off and enhancing soft tissue density, suspicious for urothelial neoplasm. Recommend further evaluation with cystoscopy. 2. Dilated rectum contains large volume stool. Moderate to large volume stool within the remainder of the colon. Recommend correlation with constipation. 3. Cholelithiasis.   Aortic Atherosclerosis (ICD10-I70.0).  EKG 10/01/23  Sinus bradycardia RBBB   CV:  Cardiac monitor 06/19/23:  HR 40 - 169 bpm, average 56 bpm. 68 SVT, longest 37.9 seconds with an average rate  of 116 bpm Occasional supraventricular ectopy, 1.6% Rare ventricular ectopy. No atrial fibrillation.  Echo 05/09/2022:   IMPRESSIONS    1. Left ventricular ejection fraction, by estimation, is 65 to 70%. Left ventricular ejection fraction by 3D volume is 70 %. The left ventricle has normal function. The left ventricle has no regional wall motion abnormalities. Left  ventricular diastolic  parameters are consistent with Grade I diastolic dysfunction (impaired relaxation).  2. Right ventricular systolic function is normal. The right ventricular size is normal. There is normal pulmonary artery systolic pressure. The estimated right ventricular systolic pressure is 31.5 mmHg.  3. Left atrial size was moderately dilated.  4. Right atrial size was moderately dilated.  5. The mitral valve is normal in structure. Trivial mitral valve regurgitation. No evidence of mitral stenosis.  6. The aortic valve is tricuspid. Aortic valve regurgitation is trivial. No aortic stenosis is present.  7. The inferior vena cava is normal in size with greater than 50% respiratory variability, suggesting right atrial pressure of 3 mmHg.  Comparison(s): No significant change from prior study. Prior images reviewed side by side.  LHC 01/11/2019:    IMPRESSION: Erika Lee has normal coronary arteries and normal LV function.  The etiology of her chest pain, elevated troponins, EKG changes and wall motion and imagine 2D echo are still unclear.  She has normal LV function by ventriculography.  She may have had coronary spasm.  The sheath was removed and a TR band was placed on the right wrist to achieve patent hemostasis.  The patient left lab in stable condition.  She can be discharged home later today on her home dose of Eliquis  oral anticoagulation.    Past Medical History:  Diagnosis Date   Anticoagulant long-term use    eliquis    Chronic kidney disease    Chronic sinusitis    Dysrhythmia    A.fib,   GERD (gastroesophageal reflux disease)    Heart failure with preserved ejection fraction (HCC)    Hemorrhoids    History of colitis 10/2016   campylobacter   Hyperlipidemia    Hypertension    Multinodular goiter    per pt has had 2 biopsy's both benign   NSTEMI (non-ST elevated myocardial infarction) (HCC)    12/2018- normal coronary arteries on cath, poss coronary  vasospasm.    Osteoarthritis    "all over"  and [facility] joint left shoulder   Paroxysmal atrial fibrillation [facility]) EP cardiologist--  dr Nunzio Belch   s/p PVI by Dr Nunzio Belch 07/16/2013;   pt first dx 2008,  recurrence 2014 afib/flutter and tachy-brady   Rotator cuff tear, left    Shoulder impingement, left    Status post placement of implantable loop recorder    original placement 07-26-2014;  removal / replacement 10-17-2017  by dr allred   Type 2 diabetes mellitus (HCC)    followed by pcp    Past Surgical History:  Procedure Laterality Date   ATRIAL FIBRILLATION ABLATION N/A 07/16/2013   PVI and CTI ablation by Dr Nunzio Belch   CARDIAC CATHETERIZATION     01/11/2019- Normal coronary arteries.    COLONOSCOPY  09/10/2011   Dr. Larrie Po: normal, 10 year follow up.   implantable loop recorder removal  04/25/2021   MDT LINQ removed by Dr Nunzio Belch   LEFT HEART CATH AND CORONARY ANGIOGRAPHY N/A 01/11/2019   Procedure: LEFT HEART CATH AND CORONARY ANGIOGRAPHY;  Surgeon: Avanell Leigh, MD;  [facility];  Service: Cardiovascular;  Laterality: N/A;  LESION REMOVAL N/A 05/03/2013   Procedure: EXCISION CYST, BACK;  Surgeon: Beau Bound, MD;  Location: [facility];  Service: General;  Laterality: N/A;   LOOP RECORDER IMPLANT N/A 07/26/2014   Procedure: LOOP RECORDER IMPLANT;  Surgeon: Jolly Needle, MD;  [facility] LAB;  Service: Cardiovascular;  Laterality: N/A;   LOOP RECORDER INSERTION N/A 10/17/2017   MDT previously implanted ILR for afib management at [facility].  old device removed and new device placed by Dr Nunzio Belch   LOOP RECORDER REMOVAL N/A 10/17/2017   MDT ILR removed with new device subsequently replaced   SHOULDER ARTHROSCOPY WITH ROTATOR CUFF REPAIR AND SUBACROMIAL DECOMPRESSION Left 01/07/2019   Procedure: Left shoulder subacromial decompression, distal clavicle resection, extensive debridement,;  Surgeon: Janeth Medicus, MD;  Location: [facility];  Service:  Orthopedics;  Laterality: Left;   TEE WITHOUT CARDIOVERSION N/A 07/15/2013   Procedure: TRANSESOPHAGEAL ECHOCARDIOGRAM (TEE);  Surgeon: Lenise Quince, MD;  Location: [facility] ENDOSCOPY;  Service: Cardiovascular;  Laterality: N/A;   TONSILLECTOMY  age 1   TRANSANAL HEMORRHOIDAL DEARTERIALIZATION N/A 09/22/2015   Procedure: TRANSANAL HEMORRHOIDAL LIGATION/PEXY EUA POSSIBLE HEMORRHOIDECTOMY ;  Surgeon: Candyce Champagne, MD;  Location: [facility];  Service: General;  Laterality: N/A;    MEDICATIONS:  amLODipine  (NORVASC ) 5 MG tablet   apixaban  (ELIQUIS ) 5 MG TABS tablet   azelastine  (ASTELIN ) 0.1 % nasal spray   diazepam  (VALIUM ) 5 MG tablet   empagliflozin  (JARDIANCE ) 10 MG TABS tablet   estradiol  (ESTRACE ) 0.1 MG/GM vaginal cream   glucose blood (FREESTYLE PRECISION NEO TEST) test strip   HYDROcodone -acetaminophen  (NORCO) 10-325 MG tablet   Lancets (FREESTYLE) lancets   lisinopril  (ZESTRIL ) 40 MG tablet   promethazine  (PHENERGAN ) 12.5 MG tablet   simvastatin  (ZOCOR ) 40 MG tablet   spironolactone  (ALDACTONE ) 25 MG tablet   No current facility-administered medications for this encounter.    [START ON 10/22/2023] gentamicin  (GARAMYCIN ) 300 mg in dextrose  5 % 100 mL IVPB    Erika Lee [facility] Short Stay/Anesthesiology [facility] Phone [phone] 10/21/2023 3:37 PM

## 2023-10-22 ENCOUNTER — Observation Stay (HOSPITAL_COMMUNITY)
Admission: EM | Admit: 2023-10-22 | Discharge: 2023-10-23 | Disposition: A | Discharge status: Home / Self Care | Attending: Internal Medicine | Admitting: Internal Medicine

## 2023-10-22 ENCOUNTER — Ambulatory Visit (HOSPITAL_BASED_OUTPATIENT_CLINIC_OR_DEPARTMENT_OTHER): Admitting: Anesthesiology

## 2023-10-22 ENCOUNTER — Encounter (HOSPITAL_COMMUNITY): Payer: Self-pay | Admitting: Urology

## 2023-10-22 ENCOUNTER — Encounter (HOSPITAL_COMMUNITY)
Admission: RE | Disposition: A | Discharge status: Home / Self Care | Payer: Self-pay | Source: Home / Self Care | Attending: Urology

## 2023-10-22 ENCOUNTER — Encounter (HOSPITAL_COMMUNITY): Payer: Self-pay

## 2023-10-22 ENCOUNTER — Emergency Department (HOSPITAL_COMMUNITY)

## 2023-10-22 ENCOUNTER — Ambulatory Visit (HOSPITAL_COMMUNITY): Admitting: Medical

## 2023-10-22 ENCOUNTER — Ambulatory Visit (HOSPITAL_COMMUNITY)
Admission: RE | Admit: 2023-10-22 | Discharge: 2023-10-22 | Disposition: A | Discharge status: Home / Self Care | Attending: Urology | Admitting: Urology

## 2023-10-22 ENCOUNTER — Ambulatory Visit (HOSPITAL_COMMUNITY)

## 2023-10-22 ENCOUNTER — Other Ambulatory Visit: Payer: Self-pay

## 2023-10-22 DIAGNOSIS — K311 Adult hypertrophic pyloric stenosis: Principal | ICD-10-CM

## 2023-10-22 DIAGNOSIS — N189 Chronic kidney disease, unspecified: Secondary | ICD-10-CM | POA: Diagnosis not present

## 2023-10-22 DIAGNOSIS — N131 Hydronephrosis with ureteral stricture, not elsewhere classified: Secondary | ICD-10-CM | POA: Diagnosis not present

## 2023-10-22 DIAGNOSIS — R1084 Generalized abdominal pain: Secondary | ICD-10-CM | POA: Diagnosis not present

## 2023-10-22 DIAGNOSIS — I4510 Unspecified right bundle-branch block: Secondary | ICD-10-CM | POA: Insufficient documentation

## 2023-10-22 DIAGNOSIS — E785 Hyperlipidemia, unspecified: Secondary | ICD-10-CM | POA: Insufficient documentation

## 2023-10-22 DIAGNOSIS — N183 Chronic kidney disease, stage 3 unspecified: Secondary | ICD-10-CM | POA: Insufficient documentation

## 2023-10-22 DIAGNOSIS — Z87891 Personal history of nicotine dependence: Secondary | ICD-10-CM | POA: Insufficient documentation

## 2023-10-22 DIAGNOSIS — E1165 Type 2 diabetes mellitus with hyperglycemia: Secondary | ICD-10-CM | POA: Diagnosis not present

## 2023-10-22 DIAGNOSIS — M1990 Unspecified osteoarthritis, unspecified site: Secondary | ICD-10-CM | POA: Insufficient documentation

## 2023-10-22 DIAGNOSIS — N3289 Other specified disorders of bladder: Secondary | ICD-10-CM | POA: Diagnosis not present

## 2023-10-22 DIAGNOSIS — I5032 Chronic diastolic (congestive) heart failure: Secondary | ICD-10-CM | POA: Insufficient documentation

## 2023-10-22 DIAGNOSIS — I10 Essential (primary) hypertension: Secondary | ICD-10-CM | POA: Diagnosis not present

## 2023-10-22 DIAGNOSIS — I110 Hypertensive heart disease with heart failure: Secondary | ICD-10-CM

## 2023-10-22 DIAGNOSIS — I130 Hypertensive heart and chronic kidney disease with heart failure and stage 1 through stage 4 chronic kidney disease, or unspecified chronic kidney disease: Secondary | ICD-10-CM | POA: Insufficient documentation

## 2023-10-22 DIAGNOSIS — E1122 Type 2 diabetes mellitus with diabetic chronic kidney disease: Secondary | ICD-10-CM | POA: Insufficient documentation

## 2023-10-22 DIAGNOSIS — I252 Old myocardial infarction: Secondary | ICD-10-CM | POA: Insufficient documentation

## 2023-10-22 DIAGNOSIS — I4891 Unspecified atrial fibrillation: Secondary | ICD-10-CM | POA: Insufficient documentation

## 2023-10-22 DIAGNOSIS — K3184 Gastroparesis: Secondary | ICD-10-CM | POA: Diagnosis not present

## 2023-10-22 DIAGNOSIS — E1143 Type 2 diabetes mellitus with diabetic autonomic (poly)neuropathy: Secondary | ICD-10-CM | POA: Diagnosis not present

## 2023-10-22 DIAGNOSIS — N952 Postmenopausal atrophic vaginitis: Secondary | ICD-10-CM

## 2023-10-22 DIAGNOSIS — N2889 Other specified disorders of kidney and ureter: Secondary | ICD-10-CM

## 2023-10-22 DIAGNOSIS — I509 Heart failure, unspecified: Secondary | ICD-10-CM

## 2023-10-22 DIAGNOSIS — I495 Sick sinus syndrome: Secondary | ICD-10-CM | POA: Insufficient documentation

## 2023-10-22 DIAGNOSIS — D259 Leiomyoma of uterus, unspecified: Secondary | ICD-10-CM | POA: Diagnosis not present

## 2023-10-22 DIAGNOSIS — E042 Nontoxic multinodular goiter: Secondary | ICD-10-CM | POA: Insufficient documentation

## 2023-10-22 DIAGNOSIS — N1831 Chronic kidney disease, stage 3a: Secondary | ICD-10-CM | POA: Insufficient documentation

## 2023-10-22 DIAGNOSIS — R109 Unspecified abdominal pain: Secondary | ICD-10-CM | POA: Diagnosis not present

## 2023-10-22 DIAGNOSIS — K219 Gastro-esophageal reflux disease without esophagitis: Secondary | ICD-10-CM | POA: Insufficient documentation

## 2023-10-22 DIAGNOSIS — R1110 Vomiting, unspecified: Secondary | ICD-10-CM | POA: Diagnosis present

## 2023-10-22 DIAGNOSIS — Z7901 Long term (current) use of anticoagulants: Secondary | ICD-10-CM | POA: Diagnosis not present

## 2023-10-22 DIAGNOSIS — I5030 Unspecified diastolic (congestive) heart failure: Secondary | ICD-10-CM | POA: Diagnosis not present

## 2023-10-22 DIAGNOSIS — E119 Type 2 diabetes mellitus without complications: Secondary | ICD-10-CM

## 2023-10-22 DIAGNOSIS — Z79899 Other long term (current) drug therapy: Secondary | ICD-10-CM | POA: Insufficient documentation

## 2023-10-22 DIAGNOSIS — I480 Paroxysmal atrial fibrillation: Secondary | ICD-10-CM | POA: Diagnosis not present

## 2023-10-22 DIAGNOSIS — I2510 Atherosclerotic heart disease of native coronary artery without angina pectoris: Secondary | ICD-10-CM | POA: Diagnosis not present

## 2023-10-22 DIAGNOSIS — Z7984 Long term (current) use of oral hypoglycemic drugs: Secondary | ICD-10-CM | POA: Insufficient documentation

## 2023-10-22 DIAGNOSIS — R000 Tachycardia, unspecified: Secondary | ICD-10-CM | POA: Diagnosis not present

## 2023-10-22 LAB — COMPREHENSIVE METABOLIC PANEL WITH GFR
ALT: 18 U/L (ref 0–44)
AST: 25 U/L (ref 15–41)
Albumin: 4.1 g/dL (ref 3.5–5.0)
Alkaline Phosphatase: 82 U/L (ref 38–126)
Anion gap: 11 (ref 5–15)
BUN: 21 mg/dL (ref 8–23)
CO2: 25 mmol/L (ref 22–32)
Calcium: 10.2 mg/dL (ref 8.9–10.3)
Chloride: 98 mmol/L (ref 98–111)
Creatinine, Ser: 1.16 mg/dL — ABNORMAL HIGH (ref 0.44–1.00)
GFR, Estimated: 48 mL/min — ABNORMAL LOW (ref >60)
Glucose, Bld: 239 mg/dL — ABNORMAL HIGH (ref 70–99)
Potassium: 4 mmol/L (ref 3.5–5.1)
Sodium: 134 mmol/L — ABNORMAL LOW (ref 135–145)
Total Bilirubin: 0.7 mg/dL (ref 0.0–1.2)
Total Protein: 6.8 g/dL (ref 6.5–8.1)

## 2023-10-22 LAB — GLUCOSE, CAPILLARY
Glucose-Capillary: 100 mg/dL — ABNORMAL HIGH (ref 70–99)
Glucose-Capillary: 104 mg/dL — ABNORMAL HIGH (ref 70–99)

## 2023-10-22 LAB — CBC
HCT: 41.1 % (ref 36.0–46.0)
Hemoglobin: 14.1 g/dL (ref 12.0–15.0)
MCH: 29.6 pg (ref 26.0–34.0)
MCHC: 34.3 g/dL (ref 30.0–36.0)
MCV: 86.3 fL (ref 80.0–100.0)
Platelets: 165 10*3/uL (ref 150–400)
RBC: 4.76 MIL/uL (ref 3.87–5.11)
RDW: 12.5 % (ref 11.5–15.5)
WBC: 10.9 10*3/uL — ABNORMAL HIGH (ref 4.0–10.5)
nRBC: 0 % (ref 0.0–0.2)

## 2023-10-22 LAB — LIPASE, BLOOD: Lipase: 31 U/L (ref 11–51)

## 2023-10-22 SURGERY — CYSTOSCOPY, WITH RETROGRADE PYELOGRAM AND URETERAL STENT INSERTION
Anesthesia: General | Laterality: Right

## 2023-10-22 MED ORDER — OXYCODONE HCL 5 MG PO TABS
5.0000 mg | ORAL_TABLET | Freq: Once | ORAL | Status: AC | PRN
  Administered 2023-10-22: 5 mg via ORAL

## 2023-10-22 MED ORDER — OXYBUTYNIN CHLORIDE 5 MG PO TABS: ORAL_TABLET | ORAL | 1 refills | Status: DC

## 2023-10-22 MED ORDER — LIDOCAINE HCL (PF) 2 % IJ SOLN
INTRAMUSCULAR | Status: AC
  Filled 2023-10-22: qty 5

## 2023-10-22 MED ORDER — CHLORHEXIDINE GLUCONATE 0.12 % MT SOLN
15.0000 mL | Freq: Once | OROMUCOSAL | Status: AC
  Administered 2023-10-22: 15 mL via OROMUCOSAL

## 2023-10-22 MED ORDER — OXYCODONE HCL 5 MG/5ML PO SOLN: 5.0000 mg | Freq: Once | ORAL | Status: AC | PRN

## 2023-10-22 MED ORDER — FENTANYL CITRATE PF 50 MCG/ML IJ SOSY: 25.0000 ug | PREFILLED_SYRINGE | INTRAMUSCULAR | Status: DC | PRN

## 2023-10-22 MED ORDER — ACETAMINOPHEN 500 MG PO TABS
1000.0000 mg | ORAL_TABLET | Freq: Once | ORAL | Status: AC
  Administered 2023-10-22: 1000 mg via ORAL
  Filled 2023-10-22: qty 2

## 2023-10-22 MED ORDER — PROPOFOL 10 MG/ML IV BOLUS
INTRAVENOUS | Status: AC
  Filled 2023-10-22: qty 20

## 2023-10-22 MED ORDER — FENTANYL CITRATE (PF) 100 MCG/2ML IJ SOLN
INTRAMUSCULAR | Status: AC
  Filled 2023-10-22: qty 2

## 2023-10-22 MED ORDER — GLYCOPYRROLATE 0.2 MG/ML IJ SOLN
INTRAMUSCULAR | Status: DC | PRN
  Administered 2023-10-22: .1 mg via INTRAVENOUS

## 2023-10-22 MED ORDER — LIDOCAINE HCL (PF) 2 % IJ SOLN
INTRAMUSCULAR | Status: DC | PRN
Start: 2023-10-22 — End: 2023-10-22
  Administered 2023-10-22: 60 mg via INTRADERMAL

## 2023-10-22 MED ORDER — AMISULPRIDE (ANTIEMETIC) 5 MG/2ML IV SOLN: 10.0000 mg | Freq: Once | INTRAVENOUS | Status: DC | PRN

## 2023-10-22 MED ORDER — ONDANSETRON HCL 4 MG/2ML IJ SOLN
INTRAMUSCULAR | Status: DC | PRN
  Administered 2023-10-22: 4 mg via INTRAVENOUS

## 2023-10-22 MED ORDER — IOHEXOL 300 MG/ML  SOLN
INTRAMUSCULAR | Status: DC | PRN
  Administered 2023-10-22: 10 mL

## 2023-10-22 MED ORDER — EPHEDRINE 5 MG/ML INJ
INTRAVENOUS | Status: AC
  Filled 2023-10-22: qty 5

## 2023-10-22 MED ORDER — LACTATED RINGERS IV SOLN: INTRAVENOUS | Status: DC

## 2023-10-22 MED ORDER — NITROFURANTOIN MONOHYD MACRO 100 MG PO CAPS: 100.0000 mg | ORAL_CAPSULE | Freq: Two times a day (BID) | ORAL | 0 refills | Status: DC

## 2023-10-22 MED ORDER — OXYCODONE HCL 5 MG PO TABS
ORAL_TABLET | ORAL | Status: AC
  Filled 2023-10-22: qty 1

## 2023-10-22 MED ORDER — DEXAMETHASONE SODIUM PHOSPHATE 10 MG/ML IJ SOLN
INTRAMUSCULAR | Status: DC | PRN
  Administered 2023-10-22: 6 mg via INTRAVENOUS

## 2023-10-22 MED ORDER — IOHEXOL 300 MG/ML  SOLN
100.0000 mL | Freq: Once | INTRAMUSCULAR | Status: AC | PRN
  Administered 2023-10-23: 100 mL via INTRAVENOUS

## 2023-10-22 MED ORDER — HYDROMORPHONE HCL 1 MG/ML IJ SOLN
1.0000 mg | Freq: Once | INTRAMUSCULAR | Status: AC
  Administered 2023-10-22: 1 mg via INTRAVENOUS
  Filled 2023-10-22: qty 1

## 2023-10-22 MED ORDER — DEXAMETHASONE SODIUM PHOSPHATE 10 MG/ML IJ SOLN
INTRAMUSCULAR | Status: AC
  Filled 2023-10-22: qty 1

## 2023-10-22 MED ORDER — PROPOFOL 10 MG/ML IV BOLUS
INTRAVENOUS | Status: DC | PRN
  Administered 2023-10-22: 100 mg via INTRAVENOUS

## 2023-10-22 MED ORDER — ONDANSETRON HCL 4 MG/2ML IJ SOLN
4.0000 mg | Freq: Once | INTRAMUSCULAR | Status: AC | PRN
  Administered 2023-10-22: 4 mg via INTRAVENOUS
  Filled 2023-10-22: qty 2

## 2023-10-22 MED ORDER — LACTATED RINGERS IV BOLUS
1000.0000 mL | Freq: Once | INTRAVENOUS | Status: AC
  Administered 2023-10-22: 1000 mL via INTRAVENOUS

## 2023-10-22 MED ORDER — EPHEDRINE SULFATE-NACL 50-0.9 MG/10ML-% IV SOSY
PREFILLED_SYRINGE | INTRAVENOUS | Status: DC | PRN
  Administered 2023-10-22: 5 mg via INTRAVENOUS

## 2023-10-22 MED ORDER — ORAL CARE MOUTH RINSE: 15.0000 mL | Freq: Once | OROMUCOSAL | Status: AC

## 2023-10-22 MED ORDER — SODIUM CHLORIDE 0.9 % IR SOLN
Status: DC | PRN
  Administered 2023-10-22: 3000 mL

## 2023-10-22 MED ORDER — PROPOFOL 1000 MG/100ML IV EMUL
INTRAVENOUS | Status: AC
  Filled 2023-10-22: qty 100

## 2023-10-22 MED ORDER — GLYCOPYRROLATE 0.2 MG/ML IJ SOLN
INTRAMUSCULAR | Status: AC
  Filled 2023-10-22: qty 1

## 2023-10-22 MED ORDER — FENTANYL CITRATE (PF) 100 MCG/2ML IJ SOLN
INTRAMUSCULAR | Status: DC | PRN
  Administered 2023-10-22 (×3): 25 ug via INTRAVENOUS

## 2023-10-22 MED ORDER — ONDANSETRON HCL 4 MG/2ML IJ SOLN
INTRAMUSCULAR | Status: AC
  Filled 2023-10-22: qty 2

## 2023-10-22 SURGICAL SUPPLY — 14 items
BAG COUNTER SPONGE SURGICOUNT (BAG) IMPLANT
BAG URO CATCHER STRL LF (MISCELLANEOUS) ×1 IMPLANT
CATH URETL OPEN END 6FR 70 (CATHETERS) IMPLANT
CLOTH BEACON ORANGE TIMEOUT ST (SAFETY) ×1 IMPLANT
GLOVE SURG LX STRL 8.0 MICRO (GLOVE) ×1 IMPLANT
GOWN STRL REUS W/ TWL XL LVL3 (GOWN DISPOSABLE) ×1 IMPLANT
GUIDEWIRE ANG ZIPWIRE 038X150 (WIRE) IMPLANT
GUIDEWIRE STR DUAL SENSOR (WIRE) ×1 IMPLANT
KIT TURNOVER KIT A (KITS) IMPLANT
MANIFOLD NEPTUNE II (INSTRUMENTS) ×1 IMPLANT
PACK CYSTO (CUSTOM PROCEDURE TRAY) ×1 IMPLANT
SHEATH NAVIGATOR HD 11/13X28 (SHEATH) IMPLANT
STENT URET 6FRX24 CONTOUR (STENTS) IMPLANT
TUBING CONNECTING 10 (TUBING) ×1 IMPLANT

## 2023-10-22 NOTE — Discharge Instructions (Signed)

## 2023-10-22 NOTE — Anesthesia Postprocedure Evaluation (Signed)
 Anesthesia Post Note  Patient: Erika Lee  Procedure(s) Performed: CYSTOSCOPY, WITH RETROGRADE PYELOGRAM, RIGHT URETEROSCOPY, PERFORMANCE OF RIGHT RENAL WASHINGS AND URETERAL STENT INSERTION (Right)     Patient location during evaluation: PACU Anesthesia Type: General Level of consciousness: awake Pain management: pain level controlled Vital Signs Assessment: post-procedure vital signs reviewed and stable Respiratory status: spontaneous breathing, nonlabored ventilation and respiratory function stable Cardiovascular status: blood pressure returned to baseline and stable Postop Assessment: no apparent nausea or vomiting Anesthetic complications: no   No notable events documented.  Last Vitals:  Vitals:   10/22/23 0845 10/22/23 0901  BP: (!) 166/72 (!) 159/76  Pulse: 64 63  Resp: 18 13  Temp:    SpO2: 93% 92%    Last Pain:  Vitals:   10/22/23 0930  TempSrc:   PainSc: 3                  Conard Decent

## 2023-10-22 NOTE — ED Triage Notes (Signed)
 [facility] from home. CC of abdominal pain Had a urethral sent placed today at [facility].  An hour ago she started hurting in her abdomen. Family thinks the nerve block is wearing off

## 2023-10-22 NOTE — ED Notes (Signed)
 Patient transported to CT

## 2023-10-22 NOTE — ED Notes (Signed)
 ED Provider at bedside.

## 2023-10-22 NOTE — Interval H&P Note (Signed)
 History and Physical Interval Note:  10/22/2023 7:24 AM  Erika Lee  has presented today for surgery, with the diagnosis of right ureteral mass.  The various methods of treatment have been discussed with the patient and family. After consideration of risks, benefits and other options for treatment, the patient has consented to  Procedure(s): CYSTOSCOPY, WITH RETROGRADE PYELOGRAM AND URETERAL STENT INSERTION (Right) BIOPSY, URETER (Right) as a surgical intervention.  The patient's history has been reviewed, patient examined, no change in status, stable for surgery.  I have reviewed the patient's chart and labs.  Questions were answered to the patient's satisfaction.     [name] Lailanie Hasley

## 2023-10-22 NOTE — Op Note (Signed)
 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  She was found to have significant right hydroureter ureteral nephrosis down to the mid pelvic ureter on the right.  Cystoscopy revealed no bladder lesions.  She presents now for ureteroscopic evaluation of her right ureter, possible biopsy, stent placement.  Findings: Urothelium of the bladder was normal.  Both ureteral orifices were in their normal configuration and location.  Retrograde study of the right ureter was performed.  This revealed normal ureter for the distal 6 to 7 cm.  Proximal to this there was a sharp cut off, and significant hydroureter above.  I did not see filling defects on the retrograde study.  Ureteroscopically, there was no lesion within the ureter.  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  No urothelial abnormality was noted.  The scope was passed all the way to the right UPJ.  Urothelium was normal.  Description of procedure: Patient properly identified and marked in the holding area.  She received preoperative IV gentamicin .  She was then taken to the operating room where general anesthetic was administered with the LMA.  She was placed in a dorsolithotomy position.  Genitalia and perineum were prepped, draped, proper timeout performed.  21 French panendoscope placed in the bladder.  Systematic  inspection of the bladder was performed with the above-mentioned findings noted.  6 [place] open-ended catheter utilized for retrograde pyelogram using Omnipaque .  The above-mentioned findings were noted.  Following injection of the Omnipaque , sensor tip guidewire was advanced through the open-ended catheter and easily passed into the right renal pelvis where a curl was seen.  The cystoscope was removed as well as the open-ended catheter.  Short dual-lumen semirigid ureteroscope was then passed into the bladder and up into the ureter.  It was difficult to get past the transition point.  I then dilated the ureter with the inner core/obturator of the 11/13 ureteral access catheter.  Once this was removed, the scope was then easily advanced into the more proximal ureter.  There was no evident urothelial abnormality at the transition point of the ureter.  I felt that this was more likely a stricture.  Once the scope was advanced passes transition point, ureteral washings were taken and sent for cytology labeled "right ureteral washings".  The ureteroscope was passed all the way to the UPJ without any lesions seen.  It was then removed.  I tried to dilate the strictured area with the entire ureteral access catheter.  It was too tight.  I then backed the access catheter out, and then placed a 6 [place] by 24 cm contour double-J stent with a tether removed.  This was easily guided into the renal pelvis.  Once adequate positioning was seen, the guidewire was removed and excellent proximal and distal curls were seen within the renal pelvis and bladder, respectively.  At this point the bladder was drained, scope removed and the procedure terminated.  The patient was awakened, taken to the PACU in stable condition, having tolerated procedure well.

## 2023-10-22 NOTE — Anesthesia Procedure Notes (Signed)
 Procedure Name: LMA Insertion Date/Time: 10/22/2023 7:37 AM  Performed by: Marshall Skeeter, CRNAPre-anesthesia Checklist: Patient identified, Emergency Drugs available, Suction available, Timeout performed and Patient being monitored Patient Re-evaluated:Patient Re-evaluated prior to induction Oxygen Delivery Method: Circle system utilized Preoxygenation: Pre-oxygenation with 100% oxygen Induction Type: IV induction LMA: LMA with gastric port inserted LMA Size: 4.0 Placement Confirmation: positive ETCO2 Tube secured with: Tape Dental Injury: Teeth and Oropharynx as per pre-operative assessment

## 2023-10-22 NOTE — Transfer of Care (Signed)
 Immediate Anesthesia Transfer of Care Note  Patient: Erika Lee  Procedure(s) Performed: CYSTOSCOPY, WITH RETROGRADE PYELOGRAM AND URETERAL STENT INSERTION (Right) BIOPSY, URETER (Right)  Patient Location: PACU  Anesthesia Type:General  Level of Consciousness: awake, alert , oriented, and patient cooperative  Airway & Oxygen Therapy: Patient Spontanous Breathing and Patient connected to face mask oxygen  Post-op Assessment: Report given to RN, Post -op Vital signs reviewed and stable, and Patient moving all extremities  Post vital signs: Reviewed and stable  Last Vitals:  Vitals Value Taken Time  BP 167/91 10/22/23 0820  Temp    Pulse 71 10/22/23 0822  Resp 12 10/22/23 0822  SpO2 100 % 10/22/23 0822  Vitals shown include unfiled device data.  Last Pain:  Vitals:   10/22/23 0556  TempSrc:   PainSc: 0-No pain         Complications: No notable events documented.

## 2023-10-23 ENCOUNTER — Encounter (HOSPITAL_COMMUNITY): Payer: Self-pay | Admitting: Family Medicine

## 2023-10-23 ENCOUNTER — Observation Stay (HOSPITAL_COMMUNITY)

## 2023-10-23 ENCOUNTER — Ambulatory Visit: Admitting: General Surgery

## 2023-10-23 DIAGNOSIS — D259 Leiomyoma of uterus, unspecified: Secondary | ICD-10-CM | POA: Diagnosis not present

## 2023-10-23 DIAGNOSIS — R112 Nausea with vomiting, unspecified: Secondary | ICD-10-CM | POA: Diagnosis not present

## 2023-10-23 DIAGNOSIS — I480 Paroxysmal atrial fibrillation: Secondary | ICD-10-CM | POA: Diagnosis not present

## 2023-10-23 DIAGNOSIS — N131 Hydronephrosis with ureteral stricture, not elsewhere classified: Secondary | ICD-10-CM

## 2023-10-23 DIAGNOSIS — R109 Unspecified abdominal pain: Secondary | ICD-10-CM | POA: Diagnosis not present

## 2023-10-23 DIAGNOSIS — Z4682 Encounter for fitting and adjustment of non-vascular catheter: Secondary | ICD-10-CM | POA: Diagnosis not present

## 2023-10-23 DIAGNOSIS — N1831 Chronic kidney disease, stage 3a: Secondary | ICD-10-CM | POA: Diagnosis not present

## 2023-10-23 DIAGNOSIS — R1110 Vomiting, unspecified: Secondary | ICD-10-CM | POA: Diagnosis present

## 2023-10-23 DIAGNOSIS — I10 Essential (primary) hypertension: Secondary | ICD-10-CM | POA: Diagnosis not present

## 2023-10-23 DIAGNOSIS — E1165 Type 2 diabetes mellitus with hyperglycemia: Secondary | ICD-10-CM

## 2023-10-23 DIAGNOSIS — N3289 Other specified disorders of bladder: Secondary | ICD-10-CM | POA: Diagnosis not present

## 2023-10-23 DIAGNOSIS — N183 Chronic kidney disease, stage 3 unspecified: Secondary | ICD-10-CM | POA: Insufficient documentation

## 2023-10-23 DIAGNOSIS — I5032 Chronic diastolic (congestive) heart failure: Secondary | ICD-10-CM | POA: Diagnosis not present

## 2023-10-23 LAB — CBC
HCT: 38.6 % (ref 36.0–46.0)
Hemoglobin: 13 g/dL (ref 12.0–15.0)
MCH: 29.1 pg (ref 26.0–34.0)
MCHC: 33.7 g/dL (ref 30.0–36.0)
MCV: 86.5 fL (ref 80.0–100.0)
Platelets: 156 10*3/uL (ref 150–400)
RBC: 4.46 MIL/uL (ref 3.87–5.11)
RDW: 12.5 % (ref 11.5–15.5)
WBC: 10 10*3/uL (ref 4.0–10.5)
nRBC: 0 % (ref 0.0–0.2)

## 2023-10-23 LAB — URINALYSIS, ROUTINE W REFLEX MICROSCOPIC
Bilirubin Urine: NEGATIVE
Glucose, UA: 50 mg/dL — AB
Ketones, ur: NEGATIVE mg/dL
Nitrite: NEGATIVE
Protein, ur: 100 mg/dL — AB
RBC / HPF: >50 RBC/hpf (ref 0–5)
Specific Gravity, Urine: 1.013 (ref 1.005–1.030)
WBC, UA: >50 WBC/hpf (ref 0–5)
pH: 6 (ref 5.0–8.0)

## 2023-10-23 LAB — CYTOLOGY - NON PAP

## 2023-10-23 LAB — BASIC METABOLIC PANEL WITH GFR
Anion gap: 8 (ref 5–15)
BUN: 19 mg/dL (ref 8–23)
CO2: 27 mmol/L (ref 22–32)
Calcium: 9.9 mg/dL (ref 8.9–10.3)
Chloride: 100 mmol/L (ref 98–111)
Creatinine, Ser: 0.96 mg/dL (ref 0.44–1.00)
GFR, Estimated: >60 mL/min (ref >60)
Glucose, Bld: 146 mg/dL — ABNORMAL HIGH (ref 70–99)
Potassium: 3.8 mmol/L (ref 3.5–5.1)
Sodium: 135 mmol/L (ref 135–145)

## 2023-10-23 LAB — GLUCOSE, CAPILLARY
Glucose-Capillary: 166 mg/dL — ABNORMAL HIGH (ref 70–99)
Glucose-Capillary: 107 mg/dL — ABNORMAL HIGH (ref 70–99)
Glucose-Capillary: 137 mg/dL — ABNORMAL HIGH (ref 70–99)

## 2023-10-23 MED ORDER — HYDRALAZINE HCL 20 MG/ML IJ SOLN: 5.0000 mg | Freq: Four times a day (QID) | INTRAMUSCULAR | Status: DC | PRN

## 2023-10-23 MED ORDER — PROMETHAZINE (PHENERGAN) 6.25MG IN NS 50ML IVPB
6.2500 mg | Freq: Three times a day (TID) | INTRAVENOUS | Status: DC | PRN
Start: 2023-10-23 — End: 2023-10-23

## 2023-10-23 MED ORDER — INSULIN ASPART 100 UNIT/ML IJ SOLN
0.0000 [IU] | INTRAMUSCULAR | Status: DC
  Administered 2023-10-23: 2 [IU] via SUBCUTANEOUS

## 2023-10-23 MED ORDER — ONDANSETRON HCL 4 MG PO TABS: 4.0000 mg | ORAL_TABLET | Freq: Four times a day (QID) | ORAL | Status: DC | PRN

## 2023-10-23 MED ORDER — SIMETHICONE 40 MG/0.6ML PO SUSP
40.0000 mg | Freq: Once | ORAL | Status: AC
  Administered 2023-10-23: 40 mg via ORAL
  Filled 2023-10-23: qty 0.6

## 2023-10-23 MED ORDER — HYDROMORPHONE HCL 1 MG/ML IJ SOLN
0.5000 mg | INTRAMUSCULAR | Status: DC | PRN
  Administered 2023-10-23: 0.5 mg via INTRAVENOUS
  Filled 2023-10-23: qty 0.5

## 2023-10-23 MED ORDER — ONDANSETRON HCL 4 MG/2ML IJ SOLN: 4.0000 mg | Freq: Four times a day (QID) | INTRAMUSCULAR | Status: DC | PRN

## 2023-10-23 MED ORDER — POTASSIUM CHLORIDE IN NACL 20-0.9 MEQ/L-% IV SOLN: INTRAVENOUS | Status: DC

## 2023-10-23 MED ORDER — LABETALOL HCL 5 MG/ML IV SOLN: 10.0000 mg | Freq: Three times a day (TID) | INTRAVENOUS | Status: DC | PRN

## 2023-10-23 NOTE — Assessment & Plan Note (Signed)
 Cr stable relative to baseline 1.1-1.4

## 2023-10-23 NOTE — Assessment & Plan Note (Signed)
 Radiology discussed with EDP.  Appearance suggests GOO, although no anatomical obstruction identified.  Could also be after-effects of anesthesia (?underlying gastroparesis).  - Maintain NG overnight - Anti-emetics as needed - IVF - Clamp NG in AM, repeat KUB and advance diet as tolerated

## 2023-10-23 NOTE — ED Provider Notes (Signed)
 [facility] MEDICAL SURGICAL UNIT Provider Note   CSN: [id number] Arrival date & time: 10/22/23  2056     History  Chief Complaint  Patient presents with   Abdominal Pain    Erika Lee is a 78 y.o. female.  78 year old female presents the ER today secondary to abdominal pain.  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  She presents ER tonight secondary to progressively worsening abdominal pain throughout the day since the procedure and now emesis as well.  Patient states that started with just upper abdominal pain and spread down and then across and then started having some back pain in the same left-sided area that then slowly radiate up her back.  This all got progressively worse over 5 or 6 hours.  Was unable to eat or drink anything all she had was a couple fruit loops today and it caused the pain to be low bit worse.  Had some emesis here.  No history of the same.   Abdominal Pain      Home Medications Prior to Admission medications   Medication Sig Start Date End Date Taking? Authorizing Provider  amLODipine  (NORVASC ) 5 MG tablet Take 1 tablet (5 mg total) by mouth daily. 09/22/23   Austine Lefort, MD  apixaban  (ELIQUIS ) 5 MG TABS tablet Take 1 tablet (5 mg total) by mouth 2 (two) times daily. 06/12/23   Austine Lefort, MD  azelastine  (ASTELIN ) 0.1 % nasal spray Place 1 spray into both nostrils 2 (two) times daily. Use in each nostril as directed- helps with postnasal drip 09/03/23   Amadeo June, MD  diazepam  (VALIUM ) 5 MG tablet TAKE 1 TABLET(5 MG) BY MOUTH AT BEDTIME AS NEEDED FOR SLEEP 10/03/23   Austine Lefort, MD  empagliflozin  (JARDIANCE ) 10 MG TABS tablet Take 10 mg by mouth daily.    [provider]  estradiol  (ESTRACE ) 0.1 MG/GM vaginal cream Place 1 Applicatorful vaginally at bedtime. Patient taking differently: Place 1 Applicatorful  vaginally at bedtime as needed (irritation.). 08/08/23   Austine Lefort, MD  glucose blood (FREESTYLE PRECISION NEO TEST) test strip DX: E11.9 Use to check blood sugar daily 10/13/23   Austine Lefort, MD  HYDROcodone -acetaminophen  (NORCO) 10-325 MG tablet Take 1 tablet by mouth every 6 (six) hours as needed. Patient taking differently: Take 1 tablet by mouth every 6 (six) hours as needed (pain.). 10/03/23   Austine Lefort, MD  Lancets (FREESTYLE) lancets Use to check blood sugar daily. 12/31/22   Austine Lefort, MD  lisinopril  (ZESTRIL ) 40 MG tablet TAKE 1 TABLET(40 MG) BY MOUTH DAILY 08/04/23   Austine Lefort, MD  nitrofurantoin , macrocrystal-monohydrate, (MACROBID ) 100 MG capsule Take 1 capsule (100 mg total) by mouth 2 (two) times daily for 7 days. 10/22/23 10/29/23  Trent Frizzle, MD  oxybutynin  (DITROPAN ) 5 MG tablet 1 tab po q 8 hrs prn frequent feeling of need to urinate 10/22/23   Trent Frizzle, MD  promethazine  (PHENERGAN ) 12.5 MG tablet Take 12.5 mg by mouth every 6 (six) hours as needed for nausea or vomiting.    [provider]  simvastatin  (ZOCOR ) 40 MG tablet Take 1 tablet (40 mg total) by mouth daily at 6 PM. 06/12/23   Austine Lefort, MD  spironolactone  (ALDACTONE ) 25 MG tablet Take 12.5 mg by mouth daily.    [provider]  Allergies    Clindamycin /lincomycin; Doxycycline; Keflex  [cephalexin ]; Other; Sulfonic acid (3,5-dibromo-4-h-ox-benz); Adhesive [tape]; Latex; and Sulfonamide derivatives    Review of Systems   Review of Systems  Gastrointestinal:  Positive for abdominal pain.    Physical Exam Updated Vital Signs BP (!) 160/79 (BP Location: Left Arm)   Pulse 61   Temp 99.4 F (37.4 C) (Oral)   Resp 16   Ht 5\' 3"  (1.6 m)   Wt 59 kg   LMP  (LMP Unknown)   SpO2 96%   BMI 23.03 kg/m  Physical Exam Vitals and nursing note reviewed.  Constitutional:      Appearance: She is well-developed.  HENT:     Head: Normocephalic and  atraumatic.  Cardiovascular:     Rate and Rhythm: Normal rate and regular rhythm.  Pulmonary:     Effort: No respiratory distress.     Breath sounds: No stridor.  Abdominal:     General: There is distension.     Palpations: There is no fluid wave.     Tenderness: There is generalized abdominal tenderness.  Musculoskeletal:     Cervical back: Normal range of motion.  Neurological:     Mental Status: She is alert.     ED Results / Procedures / Treatments   Labs (all labs ordered are listed, but only abnormal results are displayed) Labs Reviewed  COMPREHENSIVE METABOLIC PANEL WITH GFR - Abnormal; Notable for the following components:      Result Value   Sodium 134 (*)    Glucose, Bld 239 (*)    Creatinine, Ser 1.16 (*)    GFR, Estimated 48 (*)    All other components within normal limits  CBC - Abnormal; Notable for the following components:   WBC 10.9 (*)    All other components within normal limits  URINALYSIS, ROUTINE W REFLEX MICROSCOPIC - Abnormal; Notable for the following components:   Color, Urine AMBER (*)    APPearance HAZY (*)    Glucose, UA 50 (*)    Hgb urine dipstick LARGE (*)    Protein, ur 100 (*)    Leukocytes,Ua SMALL (*)    Bacteria, UA RARE (*)    All other components within normal limits  BASIC METABOLIC PANEL WITH GFR - Abnormal; Notable for the following components:   Glucose, Bld 146 (*)    All other components within normal limits  GLUCOSE, CAPILLARY - Abnormal; Notable for the following components:   Glucose-Capillary 166 (*)    All other components within normal limits  LIPASE, BLOOD  CBC    EKG None  Radiology DG Abd Portable 1 View Result Date: 10/23/2023 CLINICAL DATA:  Nasogastric tube placement. EXAM: PORTABLE ABDOMEN - 1 VIEW COMPARISON:  August 16, 2014 FINDINGS: A nasogastric tube is seen with its distal tip overlying the body of the stomach. The distal side hole sits approximately 5.1 cm distal to the expected region of the  gastroesophageal junction. The bowel gas pattern is normal. The proximal portion of a right-sided endo ureteral stent is noted. No radio-opaque calculi or other significant radiographic abnormality are seen. IMPRESSION: Nasogastric tube positioning, as described above. Electronically Signed   By: Virgle Grime M.D.   On: 10/23/2023 02:30   CT ABDOMEN PELVIS W CONTRAST Result Date: 10/23/2023 CLINICAL DATA:  Status post urethral stent placement today with subsequent abdominal pain. EXAM: CT ABDOMEN AND PELVIS WITH CONTRAST TECHNIQUE: Multidetector CT imaging of the abdomen and pelvis was performed using the standard protocol following bolus  administration of intravenous contrast. RADIATION DOSE REDUCTION: This exam was performed according to the departmental dose-optimization program which includes automated exposure control, adjustment of the mA and/or kV according to patient size and/or use of iterative reconstruction technique. CONTRAST:  OMNIPAQUE  IOHEXOL  300 MG/ML  SOLN COMPARISON:  September 30, 2023 FINDINGS: Lower chest: Mild bibasilar linear atelectasis is seen. Hepatobiliary: No focal liver abnormality is seen. No gallstones, gallbladder wall thickening, or biliary dilatation. Pancreas: Unremarkable. No pancreatic ductal dilatation or surrounding inflammatory changes. Spleen: Normal in size without focal abnormality. Adrenals/Urinary Tract: Adrenal glands are unremarkable. Kidneys are normal in size, without renal calculi, focal lesion, or hydronephrosis. A right-sided endo ureteral stent is seen. This represents a new finding when compared to the prior study. Mild delay of renal cortical enhancement is seen on the right. A mild amount of air is seen within the lumen of a moderately distended urinary bladder. Stomach/Bowel: The stomach is markedly distended, with extension of the gastric body into the anterior aspect of the left lower quadrant. While no clear transition zone is identified, the  duodenum is completely decompressed. The appendix is not clearly identified. Stool is seen throughout the large bowel. No dilated small or large bowel loops are identified. The sigmoid colon is collapsed and subsequently limited in evaluation. Numerous diverticula are suspected within this region. Vascular/Lymphatic: Aortic atherosclerosis. No enlarged abdominal or pelvic lymph nodes. Reproductive: A coarsely calcified uterine fibroid is seen. The bilateral adnexa are unremarkable. Other: No abdominal wall hernia or abnormality. No abdominopelvic ascites. Musculoskeletal: Marked severity multilevel degenerative changes are seen throughout the lumbar spine. IMPRESSION: 1. Marked severity gastric distension, as described above. This is likely a result of delayed gastric emptying secondary to anesthesia related effects and/or pain medication administered during the patient's recent stent placement. Follow-up to resolution is recommended, as sequelae associated with a gastric outlet obstruction cannot completely be excluded. 2. Delayed renal enhancement of the right kidney which may represent partial renal obstruction status post endo ureteral stent placement. Electronically Signed   By: Virgle Grime M.D.   On: 10/23/2023 01:17   DG C-Arm 1-60 Min-No Report Result Date: 10/22/2023 Fluoroscopy was utilized by the requesting physician.  No radiographic interpretation.    Procedures Procedures    Medications Ordered in ED Medications  0.9 % NaCl with KCl 20 mEq/ L  infusion ( Intravenous Infusion Verify 10/23/23 0416)  ondansetron  (ZOFRAN ) tablet 4 mg (has no administration in time range)    Or  ondansetron  (ZOFRAN ) injection 4 mg (has no administration in time range)  promethazine  (PHENERGAN ) 6.25 mg/NS 50 mL IVPB (has no administration in time range)  labetalol  (NORMODYNE ) injection 10 mg (has no administration in time range)  hydrALAZINE  (APRESOLINE ) injection 5 mg (has no administration in time  range)  insulin  aspart (novoLOG ) injection 0-9 Units (2 Units Subcutaneous Given 10/23/23 0348)  HYDROmorphone  (DILAUDID ) injection 0.5 mg (0.5 mg Intravenous Given 10/23/23 0509)  ondansetron  (ZOFRAN ) injection 4 mg (4 mg Intravenous Given 10/22/23 2315)  HYDROmorphone  (DILAUDID ) injection 1 mg (1 mg Intravenous Given 10/22/23 2333)  lactated ringers  bolus 1,000 mL (0 mLs Intravenous Stopped 10/23/23 0051)  iohexol  (OMNIPAQUE ) 300 MG/ML solution 100 mL (100 mLs Intravenous Contrast Given 10/23/23 0001)    ED Course/ Medical Decision Making/ A&P                                 Medical Decision Making Amount and/or Complexity of  Data Reviewed Labs: ordered. Radiology: ordered.  Risk Prescription drug management. Decision regarding hospitalization.   Patient ended up with a significantly enlarged stomach full of fluid.  Consistent with like gastric outlet obstruction but no anatomical cause to explain the same.  Discussed with radiology at length and ultimately I think is probably related to the anesthesia, age, diabetes and other medications she was on prior to the surgery today.  No evidence of complications from the surgery.  Discussed with the patient and did ask the nurse to place an NG tube and had instantly had significant pain relief along with at least 800 cc of fluid.  Patient's feel much better at this time.  Discussed with hospitalist for admission for continued suction and p.o. challenge and possibly repeat imaging as needed.   Final Clinical Impression(s) / ED Diagnoses Final diagnoses:  Gastric outlet obstruction    Rx / DC Orders ED Discharge Orders     None         Shonda Mandarino, Reymundo Caulk, MD 10/23/23 [phone]

## 2023-10-23 NOTE — TOC CM/SW Note (Signed)
 Transition of Care [facility]) - Inpatient Brief Assessment   Patient Details  Name: Erika Lee MRN: [id number] Date of Birth: 1946/02/12  Transition of Care [facility] CM/SW Contact:    Cyndie Dredge, LCSWA Phone Number: 10/23/2023, 7:54 AM   Clinical Narrative:  Transition of Care [facility]) has reviewed patient and no TOC needs have been identified at this time. We will continue to monitor patient advancement through interdisciplinary progression rounds. If new patient transition needs arise, please place a TOC consult.   Transition of Care Asessment: Insurance and Status: Insurance coverage has been reviewed Patient has primary care physician: Yes Home environment has been reviewed: Single Family Home Prior level of function:: Independent Prior/Current Home Services: No current home services Social Drivers of Health Review: SDOH reviewed no interventions necessary Readmission risk has been reviewed: Yes Transition of care needs: no transition of care needs at this time

## 2023-10-23 NOTE — Assessment & Plan Note (Signed)
 Cystoscopy and stent placement day prior to admission.   - Follow up with Dr. Joie Narrow as planned

## 2023-10-23 NOTE — Assessment & Plan Note (Addendum)
 Chronic diastolic CHF Coronary artery disease BP elevated - PRN hydralazine  and labetalol  - Hold amlodipine , spironolactone , lisinopril , simvastatin  until able to take PO

## 2023-10-23 NOTE — ED Notes (Signed)
 ED Provider at bedside.

## 2023-10-23 NOTE — H&P (Signed)
 History and Physical    Patient: Erika Lee ZOX:[id number] DOB: 08-11-1945 DOA: 10/22/2023 DOS: the patient was seen and examined on 10/23/2023 PCP: Austine Lefort, MD  Patient coming from: Home  Chief Complaint:  Chief Complaint  Patient presents with   Abdominal Pain       HPI:  78 y.o. F with pAF s/p ablation on Eliquis , DM, HTN, and dCHF presented with vomiting after procedure.  Yesterday underwent cystoscopy and right ureteral stent placement for hydronephrosis.  A ureteral mass was biopsied and stent placed.   Post-op she did well and was discharged home.  After going to bed, she woke up with abdominal pain and distension and nausea.  The pain became severe, she vomited, and came to the ER.   Returned to the ER, where CT abdomen showed markedly distended stomach.  NG placed, admitted on IVF.      Review of Systems  Constitutional:  Negative for chills, fever and malaise/fatigue.  Gastrointestinal:  Positive for abdominal pain, nausea and vomiting. Negative for blood in stool, constipation, diarrhea and melena.  Genitourinary:  Negative for flank pain, frequency and hematuria.  All other systems reviewed and are negative.    Past Medical History:  Diagnosis Date   Anticoagulant long-term use    eliquis    Chronic kidney disease    Chronic sinusitis    Dysrhythmia    A.fib,   GERD (gastroesophageal reflux disease)    Heart failure with preserved ejection fraction (HCC)    Hemorrhoids    History of colitis 10/2016   campylobacter   Hyperlipidemia    Hypertension    Multinodular goiter    per pt has had 2 biopsy's both benign   NSTEMI (non-ST elevated myocardial infarction) (HCC)    12/2018- normal coronary arteries on cath, poss coronary vasospasm.    Osteoarthritis    "all over"  and [facility] joint left shoulder   Paroxysmal atrial fibrillation [facility]) EP cardiologist--  dr Nunzio Belch   s/p PVI by Dr Nunzio Belch 07/16/2013;   pt first dx 2008,  recurrence 2014  afib/flutter and tachy-brady   Rotator cuff tear, left    Shoulder impingement, left    Status post placement of implantable loop recorder    original placement 07-26-2014;  removal / replacement 10-17-2017  by dr allred   Type 2 diabetes mellitus (HCC)    followed by pcp   Past Surgical History:  Procedure Laterality Date   ATRIAL FIBRILLATION ABLATION N/A 07/16/2013   PVI and CTI ablation by Dr Nunzio Belch   CARDIAC CATHETERIZATION     01/11/2019- Normal coronary arteries.    COLONOSCOPY  09/10/2011   Dr. Larrie Po: normal, 10 year follow up.   implantable loop recorder removal  04/25/2021   MDT LINQ removed by Dr Nunzio Belch   LEFT HEART CATH AND CORONARY ANGIOGRAPHY N/A 01/11/2019   Procedure: LEFT HEART CATH AND CORONARY ANGIOGRAPHY;  Surgeon: Avanell Leigh, MD;  Location: [facility] INVASIVE CV LAB;  Service: Cardiovascular;  Laterality: N/A;   LESION REMOVAL N/A 05/03/2013   Procedure: EXCISION CYST, BACK;  Surgeon: Beau Bound, MD;  Location: [facility] ORS;  Service: General;  Laterality: N/A;   LOOP RECORDER IMPLANT N/A 07/26/2014   Procedure: LOOP RECORDER IMPLANT;  Surgeon: Jolly Needle, MD;  Location: [facility] CATH LAB;  Service: Cardiovascular;  Laterality: N/A;   LOOP RECORDER INSERTION N/A 10/17/2017   MDT previously implanted ILR for afib management at [facility].  old device removed and new device placed by Dr  Allred   LOOP RECORDER REMOVAL N/A 10/17/2017   MDT ILR removed with new device subsequently replaced   SHOULDER ARTHROSCOPY WITH ROTATOR CUFF REPAIR AND SUBACROMIAL DECOMPRESSION Left 01/07/2019   Procedure: Left shoulder subacromial decompression, distal clavicle resection, extensive debridement,;  Surgeon: Janeth Medicus, MD;  Location: [facility];  Service: Orthopedics;  Laterality: Left;   TEE WITHOUT CARDIOVERSION N/A 07/15/2013   Procedure: TRANSESOPHAGEAL ECHOCARDIOGRAM (TEE);  Surgeon: Lenise Quince, MD;  Location: [facility] ENDOSCOPY;  Service: Cardiovascular;   Laterality: N/A;   TONSILLECTOMY  age 62   TRANSANAL HEMORRHOIDAL DEARTERIALIZATION N/A 09/22/2015   Procedure: TRANSANAL HEMORRHOIDAL LIGATION/PEXY EUA POSSIBLE HEMORRHOIDECTOMY ;  Surgeon: Candyce Champagne, MD;  Location: [facility] ORS;  Service: General;  Laterality: N/A;   Social History:  reports that she quit smoking about 29 years ago. Her smoking use included cigarettes. She started smoking about 63 years ago. She has been exposed to tobacco smoke. She has never used smokeless tobacco. She reports that she does not drink alcohol and does not use drugs.  Allergies  Allergen Reactions   Clindamycin /Lincomycin Rash   Doxycycline Other (See Comments)    Makes heart race   Keflex  [Cephalexin ] Nausea And Vomiting   Other    Sulfonic Acid (3,5-Dibromo-4-H-Ox-Benz)    Adhesive [Tape] Rash   Latex Rash   Sulfonamide Derivatives Nausea And Vomiting    Family History  Problem Relation Age of Onset   Stroke Mother 61   Stroke Maternal Grandmother        stroke   Other Maternal Grandfather        "heart dropsy" kidney issues   Kidney disease Maternal Grandfather    Colon cancer Neg Hx     Prior to Admission medications   Medication Sig Start Date End Date Taking? Authorizing Provider  amLODipine  (NORVASC ) 5 MG tablet Take 1 tablet (5 mg total) by mouth daily. 09/22/23   Austine Lefort, MD  apixaban  (ELIQUIS ) 5 MG TABS tablet Take 1 tablet (5 mg total) by mouth 2 (two) times daily. 06/12/23   Austine Lefort, MD  azelastine  (ASTELIN ) 0.1 % nasal spray Place 1 spray into both nostrils 2 (two) times daily. Use in each nostril as directed- helps with postnasal drip 09/03/23   Amadeo June, MD  diazepam  (VALIUM ) 5 MG tablet TAKE 1 TABLET(5 MG) BY MOUTH AT BEDTIME AS NEEDED FOR SLEEP 10/03/23   Austine Lefort, MD  empagliflozin  (JARDIANCE ) 10 MG TABS tablet Take 10 mg by mouth daily.    [provider]  estradiol  (ESTRACE ) 0.1 MG/GM vaginal cream Place 1 Applicatorful vaginally at  bedtime. Patient taking differently: Place 1 Applicatorful vaginally at bedtime as needed (irritation.). 08/08/23   Austine Lefort, MD  glucose blood (FREESTYLE PRECISION NEO TEST) test strip DX: E11.9 Use to check blood sugar daily 10/13/23   Austine Lefort, MD  HYDROcodone -acetaminophen  (NORCO) 10-325 MG tablet Take 1 tablet by mouth every 6 (six) hours as needed. Patient taking differently: Take 1 tablet by mouth every 6 (six) hours as needed (pain.). 10/03/23   Austine Lefort, MD  Lancets (FREESTYLE) lancets Use to check blood sugar daily. 12/31/22   Austine Lefort, MD  lisinopril  (ZESTRIL ) 40 MG tablet TAKE 1 TABLET(40 MG) BY MOUTH DAILY 08/04/23   Austine Lefort, MD  nitrofurantoin , macrocrystal-monohydrate, (MACROBID ) 100 MG capsule Take 1 capsule (100 mg total) by mouth 2 (two) times daily for 7 days. 10/22/23 10/29/23  Trent Frizzle, MD  oxybutynin  (  DITROPAN ) 5 MG tablet 1 tab po q 8 hrs prn frequent feeling of need to urinate 10/22/23   Trent Frizzle, MD  promethazine  (PHENERGAN ) 12.5 MG tablet Take 12.5 mg by mouth every 6 (six) hours as needed for nausea or vomiting.    [provider]  simvastatin  (ZOCOR ) 40 MG tablet Take 1 tablet (40 mg total) by mouth daily at 6 PM. 06/12/23   Austine Lefort, MD  spironolactone  (ALDACTONE ) 25 MG tablet Take 12.5 mg by mouth daily.    [provider]    Physical Exam: Vitals:   10/22/23 2330 10/23/23 0200 10/23/23 0245 10/23/23 0313  BP: (!) 165/88 (!) 147/75 (!) 167/74 (!) 160/79  Pulse: 75 (!) 58 61 61  Resp:  16 14 16   Temp:  98.2 F (36.8 C)  99.4 F (37.4 C)  TempSrc:  Oral  Oral  SpO2: 95% 92% 94% 96%  Weight:      Height:       Elderly adult female, sitting up in bed, interactive and appropriate, NG tube in place Sclera anicteric, conjunctiva pink, lids and lashes normal no.  No nasal deformity, discharge, or epistaxis, oropharynx moist, no oral lesions RRR, no murmurs, no peripheral  edema Respiratory rate normal, lungs clear without rales or wheezes Abdomen soft, no distention, no tenderness palpation in all quadrants Attention normal, affect appropriate, judgment and insight appear normal, face symmetric, speech fluent, moves all extremities with normal strength and coordination    Data Reviewed: Basic metabolic panel shows mild hyponatremia Creatinine 1.1, stable within range 1.1-1.4 baseline Glucose elevated Urine shows red blood cells and white blood cells Lipase normal CT of the abdomen and pelvis shows distended stomach, possible sequelae of anesthesia versus gastric outlet obstruction Post NG tube KUB, personally reviewed, shows no air in the stomach    Assessment and Plan: * Vomiting Postanesthesia gastroparesis versus gastric outlet obstruction Radiology discussed with EDP.  Appearance suggests GOO, although no anatomical obstruction identified.  Could also be after-effects of anesthesia (?underlying gastroparesis).  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   - Follow up with Dr. Joie Narrow as planned  CKD (chronic kidney disease), stage IIIa (HCC) Cr stable relative to baseline 1.1-1.4  Type 2 diabetes mellitus (HCC) Hyperglycemia noted - Hold Jardiance  - SS correction insulin   Paroxysmal atrial fibrillation (HCC) CHA2DS2-Vasc 5.   - Hold Eliquis  until able to take PO - Defer heparin  bridge for now  Essential hypertension, benign Chronic diastolic CHF Coronary artery disease BP elevated - PRN hydralazine  and labetalol  - Hold amlodipine , spironolactone , lisinopril , simvastatin  until able to take PO          Advance Care Planning: Full code,  discussed with patient  Consults: None needed  Family Communication: None present  Severity of Illness: The appropriate patient status for this patient is OBSERVATION. Observation status is judged to be reasonable and necessary in order to provide the required intensity of service to ensure the patient's safety. The patient's presenting symptoms, physical exam findings, and initial radiographic and laboratory data in the context of their medical condition is felt to place them at decreased risk for further clinical deterioration. Furthermore, it is anticipated that the patient will be medically  stable for discharge from the hospital within 2 midnights of admission.   Author: Ephriam Hashimoto, MD 10/23/2023 4:15 AM  For on call review [email or web address].

## 2023-10-23 NOTE — ED Notes (Signed)
 Patient transported to CT

## 2023-10-23 NOTE — Discharge Summary (Signed)
 Physician Discharge Summary  Erika Lee AVW:[id number] DOB: 26-Aug-1945 DOA: 10/22/2023  PCP: Austine Lefort, MD  Admit date: 10/22/2023  Discharge date: 10/23/2023  Admitted From:Home  Disposition:  Home  Recommendations for Outpatient Follow-up:  Follow up with PCP in 1-2 weeks Continue home medications as noted previously  Home Health: None  Equipment/Devices: None  Discharge Condition:Stable  CODE STATUS: Full  Diet recommendation: Heart Healthy/carb modified soft diet  Brief/Interim Summary: 78 y.o. F with pAF s/p ablation on Eliquis , DM, HTN, and dCHF presented with vomiting after procedure.   Yesterday underwent cystoscopy and right ureteral stent placement for hydronephrosis.  A ureteral mass was biopsied and stent placed.   Post-op she did well and was discharged home.  After going to bed, she woke up with abdominal pain and distension and nausea.  The pain became severe, she vomited, and came to the ER.    Returned to the ER, where CT abdomen showed markedly distended stomach.  NG placed, admitted on IVF.  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  She is passing flatus, however has not had a bowel movement.  She has tolerated diet and is eager for discharge at this point.  No other acute events or concerns noted and she appears stable for discharge.  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  Discharge Instructions  Discharge Instructions     Diet - low  sodium heart healthy   Complete by: As directed    Increase activity slowly   Complete by: As directed       Allergies as of 10/23/2023       Reactions   Clindamycin /lincomycin Rash   Doxycycline Other (See Comments)   Makes heart race   Keflex  [cephalexin ] Nausea And Vomiting   Other    Sulfonic Acid (3,5-dibromo-4-h-ox-benz)    Adhesive [tape] Rash   Latex Rash   Sulfonamide Derivatives Nausea And Vomiting        Medication List     TAKE these medications    amLODipine  5 MG tablet Commonly known as: NORVASC  Take 1 tablet (5 mg total) by mouth daily.   apixaban  5 MG Tabs tablet Commonly known as: Eliquis  Take 1 tablet (5 mg total) by mouth 2 (two) times daily.   azelastine  0.1 % nasal spray Commonly known as: ASTELIN  Place 1 spray into both nostrils 2 (two) times daily. Use in each nostril as directed- helps with postnasal drip   diazepam  5 MG tablet Commonly known as: VALIUM  TAKE 1 TABLET(5 MG) BY MOUTH AT BEDTIME AS NEEDED FOR SLEEP   estradiol  0.1 MG/GM vaginal cream Commonly known as: ESTRACE  Place 1 Applicatorful vaginally at bedtime. What changed:  when to take this reasons to take this   freestyle lancets Use to check blood sugar daily.   FreeStyle Precision Neo Test test strip Generic drug: glucose blood DX: E11.9 Use to check blood sugar daily   HYDROcodone -acetaminophen  10-325 MG tablet Commonly known as: NORCO Take 1 tablet by mouth every 6 (six) hours as needed. What changed: reasons to  take this   Jardiance  10 MG Tabs tablet Generic drug: empagliflozin  Take 10 mg by mouth daily.   lisinopril  40 MG tablet Commonly known as: ZESTRIL  TAKE 1 TABLET(40 MG) BY MOUTH DAILY   nitrofurantoin  (macrocrystal-monohydrate) 100 MG capsule Commonly known as: Macrobid  Take 1 capsule (100 mg total) by mouth 2 (two) times daily for 7 days.   oxybutynin  5 MG tablet Commonly known as: DITROPAN  1 tab po q 8 hrs prn frequent feeling of need to  urinate   promethazine  12.5 MG tablet Commonly known as: PHENERGAN  Take 12.5 mg by mouth every 6 (six) hours as needed for nausea or vomiting.   simvastatin  40 MG tablet Commonly known as: ZOCOR  Take 1 tablet (40 mg total) by mouth daily at 6 PM.   spironolactone  25 MG tablet Commonly known as: ALDACTONE  Take 12.5 mg by mouth daily.        Follow-up Information     Austine Lefort, MD. Schedule an appointment as soon as possible for a visit in 1 week(s).   Specialty: Family Medicine Contact information: [address] [place] [address] [phone]                Allergies  Allergen Reactions   Clindamycin /Lincomycin Rash   Doxycycline Other (See Comments)    Makes heart race   Keflex  [Cephalexin ] Nausea And Vomiting   Other    Sulfonic Acid (3,5-Dibromo-4-H-Ox-Benz)    Adhesive [Tape] Rash   Latex Rash   Sulfonamide Derivatives Nausea And Vomiting    Consultations: None   Procedures/Studies: DG Abd Portable 1 View Result Date: 10/23/2023 CLINICAL DATA:  Nasogastric tube placement. EXAM: PORTABLE ABDOMEN - 1 VIEW COMPARISON:  August 16, 2014 FINDINGS: A nasogastric tube is seen with its distal tip overlying the body of the stomach. The distal side hole sits approximately 5.1 cm distal to the expected region of the gastroesophageal junction. The bowel gas pattern is normal. The proximal portion of a right-sided endo ureteral stent is noted. No radio-opaque calculi or other significant radiographic abnormality are seen. IMPRESSION: Nasogastric tube positioning, as described above. Electronically Signed   By: Virgle Grime M.D.   On: 10/23/2023 02:30   CT ABDOMEN PELVIS W CONTRAST Result Date: 10/23/2023 CLINICAL DATA:  Status post urethral stent placement today with subsequent abdominal pain. EXAM: CT ABDOMEN AND PELVIS WITH CONTRAST TECHNIQUE: Multidetector CT imaging of the abdomen and pelvis was performed using the standard protocol  following bolus administration of intravenous contrast. RADIATION DOSE REDUCTION: This exam was performed according to the departmental dose-optimization program which includes automated exposure control, adjustment of the mA and/or kV according to patient size and/or use of iterative reconstruction technique. CONTRAST:  OMNIPAQUE  IOHEXOL  300 MG/ML  SOLN COMPARISON:  September 30, 2023 FINDINGS: Lower chest: Mild bibasilar linear atelectasis is seen. Hepatobiliary: No focal liver abnormality is seen. No gallstones, gallbladder wall thickening, or biliary dilatation. Pancreas: Unremarkable. No pancreatic ductal dilatation or surrounding inflammatory changes. Spleen: Normal in size without focal abnormality. Adrenals/Urinary Tract: Adrenal glands are unremarkable. Kidneys are normal in size, without renal calculi, focal lesion, or hydronephrosis. A right-sided endo ureteral stent is seen. This represents a new finding when compared to the prior study. Mild delay of renal cortical enhancement is seen on the right. A mild amount of air is seen within the lumen of a moderately distended urinary bladder. Stomach/Bowel: The stomach is markedly distended, with extension of the gastric body into the anterior aspect of the left  lower quadrant. While no clear transition zone is identified, the duodenum is completely decompressed. The appendix is not clearly identified. Stool is seen throughout the large bowel. No dilated small or large bowel loops are identified. The sigmoid colon is collapsed and subsequently limited in evaluation. Numerous diverticula are suspected within this region. Vascular/Lymphatic: Aortic atherosclerosis. No enlarged abdominal or pelvic lymph nodes. Reproductive: A coarsely calcified uterine fibroid is seen. The bilateral adnexa are unremarkable. Other: No abdominal wall hernia or abnormality. No abdominopelvic ascites. Musculoskeletal: Marked severity multilevel degenerative changes are seen  throughout the lumbar spine. IMPRESSION: 1. Marked severity gastric distension, as described above. This is likely a result of delayed gastric emptying secondary to anesthesia related effects and/or pain medication administered during the patient's recent stent placement. Follow-up to resolution is recommended, as sequelae associated with a gastric outlet obstruction cannot completely be excluded. 2. Delayed renal enhancement of the right kidney which may represent partial renal obstruction status post endo ureteral stent placement. Electronically Signed   By: Virgle Grime M.D.   On: 10/23/2023 01:17   DG C-Arm 1-60 Min-No Report Result Date: 10/22/2023 Fluoroscopy was utilized by the requesting physician.  No radiographic interpretation.   MM 3D SCREENING MAMMOGRAM BILATERAL BREAST Result Date: 10/10/2023 CLINICAL DATA:  Screening. EXAM: DIGITAL SCREENING BILATERAL MAMMOGRAM WITH TOMOSYNTHESIS AND CAD TECHNIQUE: Bilateral screening digital craniocaudal and mediolateral oblique mammograms were obtained. Bilateral screening digital breast tomosynthesis was performed. The images were evaluated with computer-aided detection. COMPARISON:  Previous exam(s). ACR Breast Density Category c: The breasts are heterogeneously dense, which may obscure small masses. FINDINGS: In the right breast microcalcifications requires further evaluation. In the left breast microcalcifications requires further evaluation. IMPRESSION: Further evaluation is suggested for possible microcalcifications in the right breast. Further evaluation is suggested for possible microcalcifications in the left breast. RECOMMENDATION: Diagnostic mammogram and possibly ultrasound of both breasts. (Code:[id number]) The patient will be contacted regarding the findings, and additional imaging will be scheduled. BI-RADS CATEGORY  0: Incomplete: Need additional imaging evaluation. Electronically Signed   By: Roda Cirri M.D.   On: 10/10/2023 16:20   CT  HEMATURIA WORKUP Result Date: 10/01/2023 CLINICAL DATA:  Unintentional weight loss and gross hematuria EXAM: CT CHEST WITH CONTRAST CT ABDOMEN AND PELVIS WITH AND WITHOUT CONTRAST TECHNIQUE: Multidetector CT imaging of the chest was performed during intravenous contrast administration. Multidetector CT imaging of the abdomen and pelvis was performed following the standard protocol before and during bolus administration of intravenous contrast. RADIATION DOSE REDUCTION: This exam was performed according to the departmental dose-optimization program which includes automated exposure control, adjustment of the mA and/or kV according to patient size and/or use of iterative reconstruction technique. CONTRAST:  OMNIPAQUE  IOHEXOL  350 MG/ML SOLN COMPARISON:  CT abdomen and pelvis dated 08/14/2023, CTA chest dated 01/09/2019 FINDINGS: CT CHEST FINDINGS Cardiovascular: Multichamber cardiomegaly. No significant pericardial fluid/thickening. Great vessels are normal in course and caliber. No central pulmonary emboli. Coronary artery calcifications. Mediastinum/Nodes: Enlarged, heterogeneous thyroid  gland, previously evaluated. Normal esophagus. No pathologically enlarged axillary, supraclavicular, mediastinal, or hilar lymph nodes. Lungs/Pleura: The central airways are patent. No suspicious pulmonary nodules. No focal consolidation. No pneumothorax. No pleural effusion. Musculoskeletal: No acute or abnormal lytic or blastic osseous lesions. Multilevel degenerative changes of the thoracic spine. CT ABDOMEN PELVIS FINDINGS Hepatobiliary: Subcentimeter segment 5 hypodensity (9:21), too small to characterize. No intra or extrahepatic biliary ductal dilation. Cholelithiasis. Pancreas: No focal lesions or main ductal dilation. Spleen: Normal in size without focal abnormality. Adrenals/Urinary Tract: No adrenal  nodules. Similar mild-to-moderate right hydroureteronephrosis to the level of the distal right ureter, where there is  abrupt cut off and enhancing soft tissue density (12:46, 13:65). Delayed right nephrogram. No suspicious renal masses or calculi. No suspicious filling defect visualized within the opacified portions of the left renal collecting system or ureter on delayed imaging. No focal bladder wall thickening. Stomach/Bowel: Normal appearance of the stomach. No evidence of bowel wall thickening or inflammatory changes. Dilated rectum contains large volume stool. Moderate to large volume stool within the remainder of the colon. Appendix is not discretely seen. Vascular/Lymphatic: Aortic atherosclerosis. No enlarged abdominal or pelvic lymph nodes. Reproductive: Calcified uterine leiomyoma.  No adnexal masses. Other: No free fluid, fluid collection, or free air. Musculoskeletal: No acute or abnormal lytic or blastic osseous findings. Multilevel degenerative changes of the lumbar spine. IMPRESSION: CT CHEST: 1. No acute intrathoracic abnormality. 2. Multichamber cardiomegaly. 3. Coronary artery calcifications. Assessment for potential risk factor modification, dietary therapy or pharmacologic therapy may be warranted, if clinically indicated. CT ABDOMEN AND PELVIS: 1. Similar mild-to-moderate right hydroureteronephrosis to the level of the distal right ureter, where there is abrupt cut off and enhancing soft tissue density, suspicious for urothelial neoplasm. Recommend further evaluation with cystoscopy. 2. Dilated rectum contains large volume stool. Moderate to large volume stool within the remainder of the colon. Recommend correlation with constipation. 3. Cholelithiasis. Aortic Atherosclerosis (ICD10-I70.0). Electronically Signed   By: Limin  Xu M.D.   On: 10/01/2023 16:21   CT Chest W Contrast Result Date: 10/01/2023 CLINICAL DATA:  Unintentional weight loss and gross hematuria EXAM: CT CHEST WITH CONTRAST CT ABDOMEN AND PELVIS WITH AND WITHOUT CONTRAST TECHNIQUE: Multidetector CT imaging of the chest was performed during  intravenous contrast administration. Multidetector CT imaging of the abdomen and pelvis was performed following the standard protocol before and during bolus administration of intravenous contrast. RADIATION DOSE REDUCTION: This exam was performed according to the departmental dose-optimization program which includes automated exposure control, adjustment of the mA and/or kV according to patient size and/or use of iterative reconstruction technique. CONTRAST:  OMNIPAQUE  IOHEXOL  350 MG/ML SOLN COMPARISON:  CT abdomen and pelvis dated 08/14/2023, CTA chest dated 01/09/2019 FINDINGS: CT CHEST FINDINGS Cardiovascular: Multichamber cardiomegaly. No significant pericardial fluid/thickening. Great vessels are normal in course and caliber. No central pulmonary emboli. Coronary artery calcifications. Mediastinum/Nodes: Enlarged, heterogeneous thyroid  gland, previously evaluated. Normal esophagus. No pathologically enlarged axillary, supraclavicular, mediastinal, or hilar lymph nodes. Lungs/Pleura: The central airways are patent. No suspicious pulmonary nodules. No focal consolidation. No pneumothorax. No pleural effusion. Musculoskeletal: No acute or abnormal lytic or blastic osseous lesions. Multilevel degenerative changes of the thoracic spine. CT ABDOMEN PELVIS FINDINGS Hepatobiliary: Subcentimeter segment 5 hypodensity (9:21), too small to characterize. No intra or extrahepatic biliary ductal dilation. Cholelithiasis. Pancreas: No focal lesions or main ductal dilation. Spleen: Normal in size without focal abnormality. Adrenals/Urinary Tract: No adrenal nodules. Similar mild-to-moderate right hydroureteronephrosis to the level of the distal right ureter, where there is abrupt cut off and enhancing soft tissue density (12:46, 13:65). Delayed right nephrogram. No suspicious renal masses or calculi. No suspicious filling defect visualized within the opacified portions of the left renal collecting system or ureter on  delayed imaging. No focal bladder wall thickening. Stomach/Bowel: Normal appearance of the stomach. No evidence of bowel wall thickening or inflammatory changes. Dilated rectum contains large volume stool. Moderate to large volume stool within the remainder of the colon. Appendix is not discretely seen. Vascular/Lymphatic: Aortic atherosclerosis. No enlarged abdominal or pelvic  lymph nodes. Reproductive: Calcified uterine leiomyoma.  No adnexal masses. Other: No free fluid, fluid collection, or free air. Musculoskeletal: No acute or abnormal lytic or blastic osseous findings. Multilevel degenerative changes of the lumbar spine. IMPRESSION: CT CHEST: 1. No acute intrathoracic abnormality. 2. Multichamber cardiomegaly. 3. Coronary artery calcifications. Assessment for potential risk factor modification, dietary therapy or pharmacologic therapy may be warranted, if clinically indicated. CT ABDOMEN AND PELVIS: 1. Similar mild-to-moderate right hydroureteronephrosis to the level of the distal right ureter, where there is abrupt cut off and enhancing soft tissue density, suspicious for urothelial neoplasm. Recommend further evaluation with cystoscopy. 2. Dilated rectum contains large volume stool. Moderate to large volume stool within the remainder of the colon. Recommend correlation with constipation. 3. Cholelithiasis. Aortic Atherosclerosis (ICD10-I70.0). Electronically Signed   By: Limin  Xu M.D.   On: 10/01/2023 16:21     Discharge Exam: Vitals:   10/23/23 0313 10/23/23 0939  BP: (!) 160/79 (!) 154/69  Pulse: 61 60  Resp: 16 18  Temp: 99.4 F (37.4 C) 98.3 F (36.8 C)  SpO2: 96% 97%   Vitals:   10/23/23 0200 10/23/23 0245 10/23/23 0313 10/23/23 0939  BP: (!) 147/75 (!) 167/74 (!) 160/79 (!) 154/69  Pulse: (!) 58 61 61 60  Resp: 16 14 16 18   Temp: 98.2 F (36.8 C)  99.4 F (37.4 C) 98.3 F (36.8 C)  TempSrc: Oral  Oral Oral  SpO2: 92% 94% 96% 97%  Weight:      Height:        General: Pt  is alert, awake, not in acute distress Cardiovascular: RRR, S1/S2 +, no rubs, no gallops Respiratory: CTA bilaterally, no wheezing, no rhonchi Abdominal: Soft, NT, ND, bowel sounds + Extremities: no edema, no cyanosis    The results of significant diagnostics from this hospitalization (including imaging, microbiology, ancillary and laboratory) are listed below for reference.     Microbiology: Recent Results (from the past 240 hours)  Surgical pcr screen     Status: None   Collection Time: 10/21/23  2:01 PM   Specimen: Nasal Mucosa; Nasal Swab  Result Value Ref Range Status   MRSA, PCR NEGATIVE NEGATIVE Final   Staphylococcus aureus NEGATIVE NEGATIVE Final    Comment: (NOTE) The Xpert SA Assay (FDA approved for NASAL specimens in patients 89 years of age and older), is one component of a comprehensive surveillance program. It is not intended to diagnose infection nor to guide or monitor treatment. Performed at [facility], [address] [place], [place] [address]      Labs: BNP (last 3 results) No results for input(s): "BNP" in the last 8760 hours. Basic Metabolic Panel: Recent Labs  Lab 10/22/23 2309 10/23/23 0420  NA 134* 135  K 4.0 3.8  CL 98 100  CO2 25 27  GLUCOSE 239* 146*  BUN 21 19  CREATININE 1.16* 0.96  CALCIUM  10.2 9.9   Liver Function Tests: Recent Labs  Lab 10/22/23 2309  AST 25  ALT 18  ALKPHOS 82  BILITOT 0.7  PROT 6.8  ALBUMIN 4.1   Recent Labs  Lab 10/22/23 2309  LIPASE 31   No results for input(s): "AMMONIA" in the last 168 hours. CBC: Recent Labs  Lab 10/22/23 2309 10/23/23 0420  WBC 10.9* 10.0  HGB 14.1 13.0  HCT 41.1 38.6  MCV 86.3 86.5  PLT 165 156   Cardiac Enzymes: No results for input(s): "CKTOTAL", "CKMB", "CKMBINDEX", "TROPONINI" in the last 168 hours. BNP: Invalid  input(s): "POCBNP" CBG: Recent Labs  Lab 10/21/23 1409 10/22/23 0544 10/22/23 0825 10/23/23 0312 10/23/23 0750  GLUCAP  100* 100* 104* 166* 107*   D-Dimer No results for input(s): "DDIMER" in the last 72 hours. Hgb A1c No results for input(s): "HGBA1C" in the last 72 hours. Lipid Profile No results for input(s): "CHOL", "HDL", "LDLCALC", "TRIG", "CHOLHDL", "LDLDIRECT" in the last 72 hours. Thyroid  function studies No results for input(s): "TSH", "T4TOTAL", "T3FREE", "THYROIDAB" in the last 72 hours.  Invalid input(s): "FREET3" Anemia work up No results for input(s): "VITAMINB12", "FOLATE", "FERRITIN", "TIBC", "IRON", "RETICCTPCT" in the last 72 hours. Urinalysis    Component Value Date/Time   COLORURINE AMBER (A) 10/22/2023 2302   APPEARANCEUR HAZY (A) 10/22/2023 2302   APPEARANCEUR Clear 10/07/2023 1025   LABSPEC 1.013 10/22/2023 2302   PHURINE 6.0 10/22/2023 2302   GLUCOSEU 50 (A) 10/22/2023 2302   HGBUR LARGE (A) 10/22/2023 2302   BILIRUBINUR NEGATIVE 10/22/2023 2302   BILIRUBINUR Negative 10/07/2023 1025   KETONESUR NEGATIVE 10/22/2023 2302   PROTEINUR 100 (A) 10/22/2023 2302   UROBILINOGEN 0.2 08/16/2014 1958   NITRITE NEGATIVE 10/22/2023 2302   LEUKOCYTESUR SMALL (A) 10/22/2023 2302   Sepsis Labs Recent Labs  Lab 10/22/23 2309 10/23/23 0420  WBC 10.9* 10.0   Microbiology Recent Results (from the past 240 hours)  Surgical pcr screen     Status: None   Collection Time: 10/21/23  2:01 PM   Specimen: Nasal Mucosa; Nasal Swab  Result Value Ref Range Status   MRSA, PCR NEGATIVE NEGATIVE Final   Staphylococcus aureus NEGATIVE NEGATIVE Final    Comment: (NOTE) The Xpert SA Assay (FDA approved for NASAL specimens in patients 22 years of age and older), is one component of a comprehensive surveillance program. It is not intended to diagnose infection nor to guide or monitor treatment. Performed at [facility], [address], [place], [place] [address]      Time coordinating discharge: 35 minutes  SIGNED:   Cornelius Dill, DO [facility] 10/23/2023,  10:30 AM  If 7PM-7AM, please contact night-coverage [email or web address]

## 2023-10-23 NOTE — Assessment & Plan Note (Signed)
 CHA2DS2-Vasc 5.   - Hold Eliquis  until able to take PO - Defer heparin  bridge for now

## 2023-10-23 NOTE — Hospital Course (Addendum)
 78 y.o. F with pAF s/p ablation on Eliquis , DM, HTN, and dCHF presented with vomiting after procedure.  Yesterday underwent cystoscopy and right ureteral stent placement for hydronephrosis.  A ureteral mass was biopsied and stent placed.   Post-op she did well and was discharged home.  After going to bed, she woke up with abdominal pain and distension and nausea.  The pain became severe, she vomited, and came to the ER.   Returned to the ER, where CT abdomen showed markedly distended stomach.  NG placed, admitted on IVF.

## 2023-10-23 NOTE — Progress Notes (Signed)
 Patient discharged home with personal belongings and follow up appointments . IV removed from RFA. Transported via w/c where son picked her up

## 2023-10-23 NOTE — Assessment & Plan Note (Addendum)
 Hyperglycemia noted - Hold Jardiance  - SS correction insulin 

## 2023-10-23 NOTE — Plan of Care (Signed)
  Problem: Education: Goal: Knowledge of General Education information will improve Description: Including pain rating scale, medication(s)/side effects and non-pharmacologic comfort measures Outcome: Progressing   Problem: Clinical Measurements: Goal: Ability to maintain clinical measurements within normal limits will improve Outcome: Progressing   Problem: Coping: Goal: Level of anxiety will decrease Outcome: Progressing   

## 2023-10-24 ENCOUNTER — Telehealth: Payer: Self-pay

## 2023-10-24 ENCOUNTER — Encounter: Payer: Self-pay | Admitting: Family Medicine

## 2023-10-24 ENCOUNTER — Ambulatory Visit: Admitting: Family Medicine

## 2023-10-24 VITALS — BP 120/72 | HR 56 | Temp 98.3°F | Ht 63.0 in | Wt 128.4 lb

## 2023-10-24 DIAGNOSIS — R300 Dysuria: Secondary | ICD-10-CM | POA: Diagnosis not present

## 2023-10-24 LAB — URINALYSIS, ROUTINE W REFLEX MICROSCOPIC
Hyaline Cast: NONE SEEN /LPF
Ketones, ur: NEGATIVE
Nitrite: NEGATIVE
Specific Gravity, Urine: 1.01 (ref 1.001–1.035)
Squamous Epithelial / HPF: NONE SEEN /HPF (ref <=5)
pH: 7 (ref 5.0–8.0)

## 2023-10-24 LAB — MICROSCOPIC MESSAGE

## 2023-10-24 MED ORDER — ONDANSETRON HCL 4 MG PO TABS: 4.0000 mg | ORAL_TABLET | Freq: Three times a day (TID) | ORAL | 0 refills | Status: DC | PRN

## 2023-10-24 MED ORDER — NITROFURANTOIN MONOHYD MACRO 100 MG PO CAPS: 100.0000 mg | ORAL_CAPSULE | Freq: Two times a day (BID) | ORAL | 0 refills | Status: AC

## 2023-10-24 NOTE — Telephone Encounter (Signed)
 Tried calling patient with no answer. My chart message sent.

## 2023-10-24 NOTE — Telephone Encounter (Signed)
-----   Message from [name] Dahlstedt sent at 10/24/2023 10:18 AM EDT ----- Please call pt--urine taken from area around blockage showed no cancer cells ----- Message ----- From: Interface, Lab In Three [facility] Sent: 10/23/2023  11:52 AM EDT To: Trent Frizzle, MD

## 2023-10-24 NOTE — Progress Notes (Signed)
 Patient Office Visit  Assessment & Plan:  Dysuria -     Urinalysis, Routine w reflex microscopic -     Urine Culture -     Nitrofurantoin  Monohyd Macro; Take 1 capsule (100 mg total) by mouth 2 (two) times daily for 5 days.  Dispense: 10 capsule; Refill: 0 -     Ondansetron  HCl; Take 1 tablet (4 mg total) by mouth every 8 (eight) hours as needed for nausea or vomiting.  Dispense: 20 tablet; Refill: 0 -     Microscopic Message   Reviewed recent Urology and ED notes during office visit. Follow-up on urine culture results.  Macrobid  sent to the pharmacy.  Zofran  to have on hand as needed.  Stay well-hydrated.  We discussed physical therapy to improve strength but patient declined.  Also recommended psychotherapy but patient does not think she needs it at this time.  If worsening symptoms over the weekend she will need to go to ED. Patient will follow-up with Dr. Cheril Cork. Return if symptoms worsen or fail to improve.   Subjective:    Patient ID: Erika Lee, female    DOB: 03/16/1946  Age: 78 y.o. MRN: [id number]  No chief complaint on file.   HPI Dysuia-having dysuria and gross hematuria for the last day.  Had a stent placed on right side this past Wednesday. Pt also had cystoscopy per Urology Dr. Joie Narrow this past Wednesday.  Had a biopsy which did not reveal malignancy.  Pt went to [facility] ER later on Wednesday with severe lower abdominal pain, vomiting  and was told that she had distended abdomen (gastric outlet obstruction).  Had an NG tube placed and IV fluids were given.  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  Patient not having abdominal pain anymore.  Had lab work done yesterday with normal kidney function and normal CBC.Has controlled type 2 DM and last A1C was 6.3. patient does not have a good appetite right now but is trying to drink fluids.  Not having nausea or vomiting, no fever or chills.  Does have upcoming appointment with her urologist Dr. Joie Narrow May 20  th in [place]. Situational stress- pt lost her husband and lives alone. Pt has good support with her church. Patient does not feel depressed.   The ASCVD Risk score (Arnett DK, et al., 2019) failed to calculate for the following reasons:   Risk score cannot be calculated because patient has a medical history suggesting prior/existing ASCVD  Past Medical History:  Diagnosis Date  . Anticoagulant long-term use    eliquis   . Chronic kidney disease   . Chronic sinusitis   . Dysrhythmia    A.fib,  . GERD (gastroesophageal reflux disease)   . Heart failure with preserved ejection fraction (HCC)   . Hemorrhoids   . History of colitis 10/2016   campylobacter  . Hyperlipidemia   . Hypertension   . Multinodular goiter    per pt has had 2 biopsy's both benign  . NSTEMI (non-ST elevated myocardial infarction) (HCC)    12/2018- normal coronary arteries on cath, poss coronary vasospasm.   . Osteoarthritis    "all over"  and AC joint left shoulder  . Paroxysmal atrial fibrillation [facility]) EP cardiologist--  dr Nunzio Belch   s/p PVI by Dr Nunzio Belch 07/16/2013;   pt first dx 2008,  recurrence 2014 afib/flutter and tachy-brady  . Rotator cuff tear, left   . Shoulder impingement, left   . Status post placement of implantable loop recorder  original placement 07-26-2014;  removal / replacement 10-17-2017  by dr allred  . Type 2 diabetes mellitus (HCC)    followed by pcp   Past Surgical History:  Procedure Laterality Date  . ATRIAL FIBRILLATION ABLATION N/A 07/16/2013   PVI and CTI ablation by Dr Nunzio Belch  . CARDIAC CATHETERIZATION     01/11/2019- Normal coronary arteries.   . COLONOSCOPY  09/10/2011   Dr. Larrie Po: normal, 10 year follow up.  . CYSTOSCOPY W/ URETERAL STENT PLACEMENT Right 10/22/2023   Procedure: CYSTOSCOPY, WITH RETROGRADE PYELOGRAM, RIGHT URETEROSCOPY, PERFORMANCE OF RIGHT RENAL WASHINGS AND URETERAL STENT INSERTION;  Surgeon: Trent Frizzle, MD;  Location: [facility];  Service:  Urology;  Laterality: Right;  . implantable loop recorder removal  04/25/2021   MDT LINQ removed by Dr Nunzio Belch  . LEFT HEART CATH AND CORONARY ANGIOGRAPHY N/A 01/11/2019   Procedure: LEFT HEART CATH AND CORONARY ANGIOGRAPHY;  Surgeon: Avanell Leigh, MD;  [facility];  Service: Cardiovascular;  Laterality: N/A;  . LESION REMOVAL N/A 05/03/2013   Procedure: EXCISION CYST, BACK;  Surgeon: Beau Bound, MD;  Location: [facility];  Service: General;  Laterality: N/A;  . LOOP RECORDER IMPLANT N/A 07/26/2014   Procedure: LOOP RECORDER IMPLANT;  Surgeon: Jolly Needle, MD;  Location: [facility] CATH LAB;  Service: Cardiovascular;  Laterality: N/A;  . LOOP RECORDER INSERTION N/A 10/17/2017   MDT previously implanted ILR for afib management at [facility].  old device removed and new device placed by Dr Nunzio Belch  . LOOP RECORDER REMOVAL N/A 10/17/2017   MDT ILR removed with new device subsequently replaced  . SHOULDER ARTHROSCOPY WITH ROTATOR CUFF REPAIR AND SUBACROMIAL DECOMPRESSION Left 01/07/2019   Procedure: Left shoulder subacromial decompression, distal clavicle resection, extensive debridement,;  Surgeon: Janeth Medicus, MD;  Location[facility];  Service: Orthopedics;  Laterality: Left;  . TEE WITHOUT CARDIOVERSION N/A 07/15/2013   Procedure: TRANSESOPHAGEAL ECHOCARDIOGRAM (TEE);  Surgeon: Lenise Quince, MD;  Location: [facility] ENDOSCOPY;  Service: Cardiovascular;  Laterality: N/A;  . TONSILLECTOMY  age 48  . TRANSANAL HEMORRHOIDAL DEARTERIALIZATION N/A 09/22/2015   Procedure: TRANSANAL HEMORRHOIDAL LIGATION/PEXY EUA POSSIBLE HEMORRHOIDECTOMY ;  Surgeon: Candyce Champagne, MD;  [facility];  Service: General;  Laterality: N/A;   Social History   Tobacco Use  . Smoking status: Former    Current packs/day: 0.00    Types: Cigarettes    Start date: 01/03/1960    Quit date: 01/02/1994    Years since quitting: 29.8    Passive exposure: Past  . Smokeless tobacco: Never  Vaping  Use  . Vaping status: Never Used  Substance Use Topics  . Alcohol use: No  . Drug use: No   Family History  Problem Relation Age of Onset  . Stroke Mother 54  . Stroke Maternal Grandmother        stroke  . Other Maternal Grandfather        "heart dropsy" kidney issues  . Kidney disease Maternal Grandfather   . Colon cancer Neg Hx    Allergies  Allergen Reactions  . Clindamycin /Lincomycin Rash  . Doxycycline Other (See Comments)    Makes heart race  . Keflex  [Cephalexin ] Nausea And Vomiting  . Other   . Sulfonic Acid (3,5-Dibromo-4-H-Ox-Benz)   . Adhesive [Tape] Rash  . Latex Rash  . Sulfonamide Derivatives Nausea And Vomiting    ROS    Objective:    BP 120/72   Pulse (!) 56   Temp 98.3 F (  36.8 C)   Ht 5\' 3"  (1.6 m)   Wt 128 lb 6 oz (58.2 kg)   LMP  (LMP Unknown)   SpO2 99%   BMI 22.74 kg/m  BP Readings from Last 3 Encounters:  10/24/23 120/72  10/23/23 134/74  10/22/23 (!) 159/76   Wt Readings from Last 3 Encounters:  10/24/23 128 lb 6 oz (58.2 kg)  10/22/23 130 lb (59 kg)  10/22/23 130 lb (59 kg)    Physical Exam Vitals and nursing note reviewed.  Constitutional:      General: She is not in acute distress.    Appearance: Normal appearance.  HENT:     Head: Normocephalic.     Right Ear: Tympanic membrane and ear canal normal.     Left Ear: Tympanic membrane and ear canal normal.  Eyes:     Extraocular Movements: Extraocular movements intact.     Pupils: Pupils are equal, round, and reactive to light.  Cardiovascular:     Rate and Rhythm: Normal rate and regular rhythm.     Heart sounds: Normal heart sounds.  Pulmonary:     Effort: Pulmonary effort is normal.     Breath sounds: Normal breath sounds.  Abdominal:     Tenderness: There is no abdominal tenderness. There is no right CVA tenderness, left CVA tenderness or guarding.  Musculoskeletal:     Right lower leg: No edema.     Left lower leg: No edema.  Neurological:     General: No  focal deficit present.     Mental Status: She is alert and oriented to person, place, and time.  Psychiatric:        Attention and Perception: Attention normal.        Mood and Affect: Mood normal. Affect is tearful.        Behavior: Behavior normal.     Results for orders placed or performed in visit on 10/24/23  Urinalysis, Routine w reflex microscopic  Result Value Ref Range   Color, Urine RED (A) YELLOW   APPearance CLOUDY (A) CLEAR   Specific Gravity, Urine 1.010 1.001 - 1.035   pH 7.0 5.0 - 8.0   Glucose, UA 3+ (A) NEGATIVE   Bilirubin Urine 1+ (A) NEGATIVE   Ketones, ur NEGATIVE NEGATIVE   Hgb urine dipstick 3+ (A) NEGATIVE   Protein, ur 3+ (A) NEGATIVE   Nitrite NEGATIVE NEGATIVE   Leukocytes,Ua 1+ (A) NEGATIVE   WBC, UA 6-10 (A) 0 - 5 /HPF   RBC / HPF PACKED (A) 0 - 2 /HPF   Squamous Epithelial / HPF NONE SEEN < OR = 5 /HPF   Bacteria, UA FEW (A) NONE SEEN /HPF   Hyaline Cast NONE SEEN NONE SEEN /LPF  Microscopic Message  Result Value Ref Range   Note

## 2023-10-25 LAB — URINE CULTURE
MICRO NUMBER:: 16376401
Result:: NO GROWTH
SPECIMEN QUALITY:: ADEQUATE

## 2023-10-26 ENCOUNTER — Encounter: Payer: Self-pay | Admitting: Family Medicine

## 2023-10-28 ENCOUNTER — Ambulatory Visit (INDEPENDENT_AMBULATORY_CARE_PROVIDER_SITE_OTHER): Admitting: Family Medicine

## 2023-10-28 ENCOUNTER — Other Ambulatory Visit: Payer: Self-pay | Admitting: Family Medicine

## 2023-10-28 ENCOUNTER — Encounter: Payer: Self-pay | Admitting: Family Medicine

## 2023-10-28 VITALS — BP 130/72 | HR 52 | Temp 98.2°F | Ht 63.0 in | Wt 126.0 lb

## 2023-10-28 DIAGNOSIS — I5030 Unspecified diastolic (congestive) heart failure: Secondary | ICD-10-CM | POA: Diagnosis not present

## 2023-10-28 DIAGNOSIS — N138 Other obstructive and reflux uropathy: Secondary | ICD-10-CM

## 2023-10-28 MED ORDER — SPIRONOLACTONE 25 MG PO TABS: 12.5000 mg | ORAL_TABLET | Freq: Every day | ORAL | 3 refills | Status: AC

## 2023-10-28 MED ORDER — PROMETHAZINE HCL 12.5 MG PO TABS: 12.5000 mg | ORAL_TABLET | Freq: Four times a day (QID) | ORAL | 3 refills | Status: DC | PRN

## 2023-10-28 MED ORDER — APIXABAN 5 MG PO TABS: 5.0000 mg | ORAL_TABLET | Freq: Two times a day (BID) | ORAL | 3 refills | Status: AC

## 2023-10-28 MED ORDER — LISINOPRIL 40 MG PO TABS: 40.0000 mg | ORAL_TABLET | Freq: Every day | ORAL | 3 refills | Status: AC

## 2023-10-28 NOTE — Progress Notes (Signed)
 Subjective:    Patient ID: Erika Lee, female    DOB: [birth year], 78 y.o.   MRN: [id number]   Admit date: 10/22/2023   Discharge date: 10/23/2023   Admitted From:Home   Disposition:  Home   Recommendations for Outpatient Follow-up:  Follow up with PCP in 1-2 weeks Continue home medications as noted previously   Home Health: None   Equipment/Devices: None   Discharge Condition:Stable   CODE STATUS: Full   Diet recommendation: Heart Healthy/carb modified soft diet   Brief/Interim Summary: 78 y.o. F with pAF s/p ablation on Eliquis , DM, HTN, and dCHF presented with vomiting after procedure.   Yesterday underwent cystoscopy and right ureteral stent placement for hydronephrosis.  A ureteral mass was biopsied and stent placed.   Post-op she did well and was discharged home.  After going to bed, she woke up with abdominal pain and distension and nausea.  The pain became severe, she vomited, and came to the ER.    Returned to the ER, where CT abdomen showed markedly distended stomach.  NG placed, admitted on IVF.  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  She is passing flatus, however has not had a bowel movement.  She has tolerated diet and is eager for discharge at this point.  No other acute events or concerns noted and she appears stable for discharge.   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    10/28/23 Patient was recently found to be in acute renal failure  secondary to obstructive nephropathy.  Fortunately this was found to be due to her ureteral stricture.  Urology has placed a stent which is still in place.  She has follow-up scheduled with them in May.  Shortly after stent placement, the patient developed postoperative gastroparesis.  She was admitted overnight, underwent NG tube decompression, and was then discharged home.  She is here today for follow-up.  She states that she has been able to eat bananas and drink fluids without any difficulty.  She continues to report dysuria.  She is currently on nitrofurantoin .  She also has symptoms of bladder spasms.  However she has a ureteral stent in place.  This likely prolonged symptoms.  24 units.  She is not taking spironolactone .  She is not taking lisinopril .  We held the spironolactone  and lisinopril  after the recent acute renal failure.  She is currently on Jardiance  primarily for congestive heart failure with preserved ejection fraction. Past Medical History:  Diagnosis Date   Anticoagulant long-term use    eliquis    Chronic kidney disease    Chronic sinusitis    Dysrhythmia    A.fib,   GERD (gastroesophageal reflux disease)    Heart failure with preserved ejection fraction (HCC)    Hemorrhoids    History of colitis 10/2016   campylobacter   Hyperlipidemia    Hypertension    Multinodular goiter    per pt has had 2 biopsy's both benign   NSTEMI (non-ST elevated myocardial infarction) (HCC)    12/2018- normal coronary  arteries on cath, poss coronary vasospasm.    Osteoarthritis    "all over"  and [facility] joint left shoulder   Paroxysmal atrial fibrillation [facility]) EP cardiologist--  dr Nunzio Belch   s/p PVI by Dr Nunzio Belch 07/16/2013;   pt first dx 2008,  recurrence 2014 afib/flutter and tachy-brady   Rotator cuff tear, left    Shoulder impingement, left    Status post placement of implantable loop recorder    original placement 07-26-2014;  removal / replacement 10-17-2017  by dr allred   Type 2  diabetes mellitus (HCC)    followed by pcp   Past Surgical History:  Procedure Laterality Date   ATRIAL FIBRILLATION ABLATION N/A 07/16/2013   PVI and CTI ablation by Dr Nunzio Belch   CARDIAC CATHETERIZATION     01/11/2019- Normal coronary arteries.    COLONOSCOPY  09/10/2011   Dr. Larrie Po: normal, 10 year follow up.   CYSTOSCOPY W/ URETERAL STENT PLACEMENT Right 10/22/2023   Procedure: CYSTOSCOPY, WITH RETROGRADE PYELOGRAM, RIGHT URETEROSCOPY, PERFORMANCE OF RIGHT RENAL WASHINGS AND URETERAL STENT INSERTION;  Surgeon: Trent Frizzle, MD;  [facility];  Service: Urology;  Laterality: Right;   implantable loop recorder removal  04/25/2021   MDT LINQ removed by Dr Nunzio Belch   LEFT HEART CATH AND CORONARY ANGIOGRAPHY N/A 01/11/2019   Procedure: LEFT HEART CATH AND CORONARY ANGIOGRAPHY;  Surgeon: Avanell Leigh, MD;  [facility];  Service: Cardiovascular;  Laterality: N/A;   LESION REMOVAL N/A 05/03/2013   Procedure: EXCISION CYST, BACK;  Surgeon: Beau Bound, MD;  Location: [facility];  Service: General;  Laterality: N/A;   LOOP RECORDER IMPLANT N/A 07/26/2014   Procedure: LOOP RECORDER IMPLANT;  Surgeon: Jolly Needle, MD;  Location: [facility] CATH LAB;  Service: Cardiovascular;  Laterality: N/A;   LOOP RECORDER INSERTION N/A 10/17/2017   MDT previously implanted ILR for afib management at [facility].  old device removed and new device placed by Dr Nunzio Belch   LOOP RECORDER REMOVAL N/A 10/17/2017   MDT ILR removed with new device subsequently replaced   SHOULDER ARTHROSCOPY WITH ROTATOR CUFF REPAIR AND SUBACROMIAL DECOMPRESSION Left 01/07/2019   Procedure: Left shoulder subacromial decompression, distal clavicle resection, extensive debridement,;  Surgeon: Janeth Medicus, MD;  Location[facility];  Service: Orthopedics;  Laterality: Left;   TEE WITHOUT CARDIOVERSION N/A 07/15/2013   Procedure: TRANSESOPHAGEAL ECHOCARDIOGRAM (TEE);  Surgeon: Lenise Quince, MD;   Location: [facility] ENDOSCOPY;  Service: Cardiovascular;  Laterality: N/A;   TONSILLECTOMY  age 46   TRANSANAL HEMORRHOIDAL DEARTERIALIZATION N/A 09/22/2015   Procedure: TRANSANAL HEMORRHOIDAL LIGATION/PEXY EUA POSSIBLE HEMORRHOIDECTOMY ;  Surgeon: Candyce Champagne, MD;  [facility];  Service: General;  Laterality: N/A;   Current Outpatient Medications on File Prior to Visit  Medication Sig Dispense Refill   amLODipine  (NORVASC ) 5 MG tablet Take 1 tablet (5 mg total) by mouth daily. 90 tablet 3   azelastine  (ASTELIN ) 0.1 % nasal spray Place 1 spray into both nostrils 2 (two) times daily. Use in each nostril as directed- helps with postnasal drip 30 mL 1   diazepam  (VALIUM ) 5 MG tablet TAKE 1 TABLET(5 MG) BY MOUTH AT BEDTIME AS NEEDED FOR SLEEP 30 tablet 3   empagliflozin  (JARDIANCE ) 10 MG TABS tablet Take 10 mg by mouth daily.     estradiol  (ESTRACE ) 0.1 MG/GM vaginal cream Place 1 Applicatorful vaginally at bedtime. (Patient taking differently: Place 1 Applicatorful vaginally at bedtime as needed (irritation.).) 42.5 g 12   glucose blood (  FREESTYLE PRECISION NEO TEST) test strip DX: E11.9 Use to check blood sugar daily 100 strip 5   HYDROcodone -acetaminophen  (NORCO) 10-325 MG tablet Take 1 tablet by mouth every 6 (six) hours as needed. (Patient taking differently: Take 1 tablet by mouth every 6 (six) hours as needed (pain.).) 120 tablet 0   Lancets (FREESTYLE) lancets Use to check blood sugar daily. 100 each 5   lisinopril  (ZESTRIL ) 40 MG tablet TAKE 1 TABLET(40 MG) BY MOUTH DAILY 90 tablet 3   nitrofurantoin , macrocrystal-monohydrate, (MACROBID ) 100 MG capsule Take 1 capsule (100 mg total) by mouth 2 (two) times daily for 5 days. 10 capsule 0   ondansetron  (ZOFRAN ) 4 MG tablet Take 1 tablet (4 mg total) by mouth every 8 (eight) hours as needed for nausea or vomiting. 20 tablet 0   oxybutynin  (DITROPAN ) 5 MG tablet 1 tab po q 8 hrs prn frequent feeling of need to urinate 30 tablet 1   promethazine   (PHENERGAN ) 12.5 MG tablet Take 12.5 mg by mouth every 6 (six) hours as needed for nausea or vomiting.     simvastatin  (ZOCOR ) 40 MG tablet Take 1 tablet (40 mg total) by mouth daily at 6 PM. 90 tablet 2   spironolactone  (ALDACTONE ) 25 MG tablet Take 12.5 mg by mouth daily.     No current facility-administered medications on file prior to visit.     Allergies  Allergen Reactions   Clindamycin /Lincomycin Rash   Doxycycline Other (See Comments)    Makes heart race   Keflex  [Cephalexin ] Nausea And Vomiting   Other    Sulfonic Acid (3,5-Dibromo-4-H-Ox-Benz)    Adhesive [Tape] Rash   Latex Rash   Sulfonamide Derivatives Nausea And Vomiting   Social History   Socioeconomic History   Marital status: Married    Spouse name: Interior and spatial designer   Number of children: 2   Years of education: 12   Highest education level: 12th grade  Occupational History   Occupation: Advertising copywriter: RETIRED    Comment: Full time  Tobacco Use   Smoking status: Former    Current packs/day: 0.00    Types: Cigarettes    Start date: 01/03/1960    Quit date: 01/02/1994    Years since quitting: 29.8    Passive exposure: Past   Smokeless tobacco: Never  Vaping Use   Vaping status: Never Used  Substance and Sexual Activity   Alcohol use: No   Drug use: No   Sexual activity: Not Currently    Partners: Male  Other Topics Concern   Not on file  Social History Narrative   One child deceased from homicide at age 16    Social Drivers of Health   Financial Resource Strain: Low Risk  (05/21/2022)   Overall Financial Resource Strain (CARDIA)    Difficulty of Paying Living Expenses: Not hard at all  Food Insecurity: No Food Insecurity (10/23/2023)   Hunger Vital Sign    Worried About Running Out of Food in the Last Year: Never true    Ran Out of Food in the Last Year: Never true  Transportation Needs: No Transportation Needs (10/23/2023)   PRAPARE - Administrator, Civil Service  (Medical): No    Lack of Transportation (Non-Medical): No  Physical Activity: Inactive (05/21/2022)   Exercise Vital Sign    Days of Exercise per Week: 0 days    Minutes of Exercise per Session: 0 min  Stress: Stress Concern Present (05/21/2022)   [employer]  of Occupational Health - Occupational Stress Questionnaire    Feeling of Stress : To some extent  Social Connections: Moderately Isolated (10/23/2023)   Social Connection and Isolation Panel [NHANES]    Frequency of Communication with Friends and Family: More than three times a week    Frequency of Social Gatherings with Friends and Family: Once a week    Attends Religious Services: 1 to 4 times per year    Active Member of [employer] or Organizations: No    Attends Banker Meetings: Never    Marital Status: Widowed  Intimate Partner Violence: Not At Risk (10/23/2023)   Humiliation, Afraid, Rape, and Kick questionnaire    Fear of Current or Ex-Partner: No    Emotionally Abused: No    Physically Abused: No    Sexually Abused: No     Review of Systems  All other systems reviewed and are negative.      Objective:   Physical Exam Vitals reviewed.  Constitutional:      General: She is not in acute distress.    Appearance: Normal appearance. She is not ill-appearing or toxic-appearing.  Cardiovascular:     Rate and Rhythm: Normal rate and regular rhythm.     Pulses: Normal pulses.     Heart sounds: Normal heart sounds. No murmur heard.    No friction rub. No gallop.  Pulmonary:     Effort: Pulmonary effort is normal. No respiratory distress.     Breath sounds: No stridor. No wheezing, rhonchi or rales.  Abdominal:     General: Abdomen is flat. Bowel sounds are normal. There is no distension.     Palpations: Abdomen is soft.     Tenderness: There is no abdominal tenderness. There is no guarding.  Musculoskeletal:     Cervical back: Neck supple.  Neurological:     General: No focal deficit present.      Mental Status: She is alert and oriented to person, place, and time.     Cranial Nerves: No cranial nerve deficit.     Sensory: No sensory deficit.     Motor: No weakness.     Coordination: Coordination normal.     Gait: Gait normal.     Deep Tendon Reflexes: Reflexes normal.  Psychiatric:        Mood and Affect: Mood normal.        Thought Content: Thought content normal.           Assessment & Plan:  Obstructive nephropathy - Plan: Urinalysis, Routine w reflex microscopic, Urine Culture, Basic Metabolic Panel Without GFR, CBC with Differential/Platelet  Heart failure with preserved ejection fraction, unspecified HF chronicity (HCC) To treat her congestive heart failure with preserved EF, I have recommended resuming spironolactone  and resuming lisinopril .  I have recommended discontinuation of Jardiance  due to frequent urinary tract infections.  I do not feel that this is a good option for this patient moving forward.  I believe a lot of her dysuria and bladder spasms are due to the presence of her ureteral stent.  Tenderness repeat that was positive, I will add additional antibiotics based on the sensitivity.  However I suspect that the urine culture will be negative.  Therefore I recommended symptomatic treatment with Azo as needed until the patient sees urology in follow-up.

## 2023-10-29 LAB — BASIC METABOLIC PANEL WITHOUT GFR
BUN/Creatinine Ratio: 18 (calc) (ref 6–22)
BUN: 19 mg/dL (ref 7–25)
CO2: 27 mmol/L (ref 20–32)
Calcium: 10 mg/dL (ref 8.6–10.4)
Chloride: 104 mmol/L (ref 98–110)
Creat: 1.05 mg/dL — ABNORMAL HIGH (ref 0.60–1.00)
Glucose, Bld: 110 mg/dL — ABNORMAL HIGH (ref 65–99)
Potassium: 4.2 mmol/L (ref 3.5–5.3)
Sodium: 139 mmol/L (ref 135–146)

## 2023-10-29 LAB — URINALYSIS, ROUTINE W REFLEX MICROSCOPIC
Bilirubin Urine: NEGATIVE
Hyaline Cast: NONE SEEN /LPF
Ketones, ur: NEGATIVE
Nitrite: NEGATIVE
Specific Gravity, Urine: 1.025 (ref 1.001–1.035)
pH: 5.5 (ref 5.0–8.0)

## 2023-10-29 LAB — CBC WITH DIFFERENTIAL/PLATELET
Absolute Lymphocytes: 2549 {cells}/uL (ref 850–3900)
Absolute Monocytes: 720 {cells}/uL (ref 200–950)
Basophils Absolute: 43 {cells}/uL (ref 0–200)
Basophils Relative: 0.6 %
Eosinophils Absolute: 130 {cells}/uL (ref 15–500)
Eosinophils Relative: 1.8 %
HCT: 42.5 % (ref 35.0–45.0)
Hemoglobin: 14 g/dL (ref 11.7–15.5)
MCH: 29.2 pg (ref 27.0–33.0)
MCHC: 32.9 g/dL (ref 32.0–36.0)
MCV: 88.5 fL (ref 80.0–100.0)
MPV: 10.3 fL (ref 7.5–12.5)
Monocytes Relative: 10 %
Neutro Abs: 3758 {cells}/uL (ref 1500–7800)
Neutrophils Relative %: 52.2 %
Platelets: 189 10*3/uL (ref 140–400)
RBC: 4.8 10*6/uL (ref 3.80–5.10)
RDW: 12.5 % (ref 11.0–15.0)
Total Lymphocyte: 35.4 %
WBC: 7.2 10*3/uL (ref 3.8–10.8)

## 2023-10-29 LAB — MICROSCOPIC MESSAGE

## 2023-10-30 ENCOUNTER — Encounter (HOSPITAL_COMMUNITY): Payer: Self-pay

## 2023-10-30 ENCOUNTER — Ambulatory Visit (HOSPITAL_COMMUNITY)
Admission: RE | Admit: 2023-10-30 | Discharge: 2023-10-30 | Disposition: A | Discharge status: Home / Self Care | Source: Ambulatory Visit | Attending: Family Medicine | Admitting: Family Medicine

## 2023-10-30 ENCOUNTER — Ambulatory Visit (HOSPITAL_COMMUNITY)

## 2023-10-30 DIAGNOSIS — R928 Other abnormal and inconclusive findings on diagnostic imaging of breast: Secondary | ICD-10-CM | POA: Diagnosis not present

## 2023-10-30 DIAGNOSIS — R92333 Mammographic heterogeneous density, bilateral breasts: Secondary | ICD-10-CM | POA: Diagnosis not present

## 2023-10-30 DIAGNOSIS — R921 Mammographic calcification found on diagnostic imaging of breast: Secondary | ICD-10-CM | POA: Diagnosis not present

## 2023-10-30 LAB — URINE CULTURE
MICRO NUMBER:: 16389324
Result:: NO GROWTH
SPECIMEN QUALITY:: ADEQUATE

## 2023-11-03 ENCOUNTER — Other Ambulatory Visit: Payer: Self-pay | Admitting: Family Medicine

## 2023-11-03 ENCOUNTER — Telehealth: Payer: Self-pay

## 2023-11-03 MED ORDER — HYDROCODONE-ACETAMINOPHEN 10-325 MG PO TABS: 1.0000 | ORAL_TABLET | Freq: Four times a day (QID) | ORAL | 0 refills | Status: DC | PRN

## 2023-11-03 NOTE — Telephone Encounter (Signed)
 Pt called in to request a refill of this med:   HYDROcodone -acetaminophen  [facility]) 10-325 MG tablet [[id number]]  LOV: 10/28/23  PHARMACY: [facility] - [place], [place] - [address] [place], [place] [address] Phone: [phone]  Fax: [phone]  CB#: [phone]

## 2023-11-17 NOTE — Progress Notes (Signed)
 History of Present Illness: Erika Lee is a 78 y.o. year old female here for follow up of recent (10/22/2023) cystoscopy, right retrograde pyelogram, right ureteroscopy, renal washings and double-J stent placement.  She had a possibility of a right ureteral mass causing the obstruction.  Ureteroscopy was negative as were the ureteral washings.  She did have dilation of this strictured area with the ureteral access sheath-13 [place].  She has had a stent since that time.  She did have to go to the emergency room and be admitted the day after her procedure due to gastric retention.  She was treated overnight with an NG tube.  She has had no right-sided flank pain.  She does have burning in her vaginal area.  Past Medical History:  Diagnosis Date   Anticoagulant long-term use    eliquis    Chronic kidney disease    Chronic sinusitis    Dysrhythmia    A.fib,   GERD (gastroesophageal reflux disease)    Heart failure with preserved ejection fraction (HCC)    Hemorrhoids    History of colitis 10/2016   campylobacter   Hyperlipidemia    Hypertension    Multinodular goiter    per pt has had 2 biopsy's both benign   NSTEMI (non-ST elevated myocardial infarction) (HCC)    12/2018- normal coronary arteries on cath, poss coronary vasospasm.    Osteoarthritis    "all over"  and [facility] joint left shoulder   Paroxysmal atrial fibrillation [facility]) EP cardiologist--  dr Nunzio Belch   s/p PVI by Dr Nunzio Belch 07/16/2013;   pt first dx 2008,  recurrence 2014 afib/flutter and tachy-brady   Rotator cuff tear, left    Shoulder impingement, left    Status post placement of implantable loop recorder    original placement 07-26-2014;  removal / replacement 10-17-2017  by dr allred   Type 2 diabetes mellitus (HCC)    followed by pcp    Past Surgical History:  Procedure Laterality Date   ATRIAL FIBRILLATION ABLATION N/A 07/16/2013   PVI and CTI ablation by Dr Nunzio Belch   CARDIAC CATHETERIZATION     01/11/2019- Normal  coronary arteries.    COLONOSCOPY  09/10/2011   Dr. Larrie Po: normal, 10 year follow up.   CYSTOSCOPY W/ URETERAL STENT PLACEMENT Right 10/22/2023   Procedure: CYSTOSCOPY, WITH RETROGRADE PYELOGRAM, RIGHT URETEROSCOPY, PERFORMANCE OF RIGHT RENAL WASHINGS AND URETERAL STENT INSERTION;  Surgeon: Trent Frizzle, MD;  Location: [facility];  Service: Urology;  Laterality: Right;   implantable loop recorder removal  04/25/2021   MDT LINQ removed by Dr Nunzio Belch   LEFT HEART CATH AND CORONARY ANGIOGRAPHY N/A 01/11/2019   Procedure: LEFT HEART CATH AND CORONARY ANGIOGRAPHY;  Surgeon: Avanell Leigh, MD;  [facility]: [facility];  Service: Cardiovascular;  Laterality: N/A;   LESION REMOVAL N/A 05/03/2013   Procedure: EXCISION CYST, BACK;  Surgeon: Beau Bound, MD;  Location: [facility];  Service: General;  Laterality: N/A;   LOOP RECORDER IMPLANT N/A 07/26/2014   Procedure: LOOP RECORDER IMPLANT;  Surgeon: Jolly Needle, MD;  Location: [facility] CATH LAB;  Service: Cardiovascular;  Laterality: N/A;   LOOP RECORDER INSERTION N/A 10/17/2017   MDT previously implanted ILR for afib management at [facility].  old device removed and new device placed by Dr Nunzio Belch   LOOP RECORDER REMOVAL N/A 10/17/2017   MDT ILR removed with new device subsequently replaced   SHOULDER ARTHROSCOPY WITH ROTATOR CUFF REPAIR AND SUBACROMIAL DECOMPRESSION Left 01/07/2019   Procedure: Left  shoulder subacromial decompression, distal clavicle resection, extensive debridement,;  Surgeon: Janeth Medicus, MD;  Location: [facility];  Service: Orthopedics;  Laterality: Left;   TEE WITHOUT CARDIOVERSION N/A 07/15/2013   Procedure: TRANSESOPHAGEAL ECHOCARDIOGRAM (TEE);  Surgeon: Lenise Quince, MD;  Location: [facility] ENDOSCOPY;  Service: Cardiovascular;  Laterality: N/A;   TONSILLECTOMY  age 67   TRANSANAL HEMORRHOIDAL DEARTERIALIZATION N/A 09/22/2015   Procedure: TRANSANAL HEMORRHOIDAL LIGATION/PEXY EUA POSSIBLE HEMORRHOIDECTOMY ;   Surgeon: Candyce Champagne, MD;  Location: [facility];  Service: General;  Laterality: N/A;    Home Medications:  (Not in a hospital admission)   Allergies:  Allergies  Allergen Reactions   Clindamycin /Lincomycin Rash   Doxycycline Other (See Comments)    Makes heart race   Keflex  [Cephalexin ] Nausea And Vomiting   Other    Sulfonic Acid (3,5-Dibromo-4-H-Ox-Benz)    Adhesive [Tape] Rash   Latex Rash   Sulfonamide Derivatives Nausea And Vomiting    Family History  Problem Relation Age of Onset   Stroke Mother 62   Stroke Maternal Grandmother        stroke   Other Maternal Grandfather        "heart dropsy" kidney issues   Kidney disease Maternal Grandfather    Colon cancer Neg Hx     Social History:  reports that she quit smoking about 29 years ago. Her smoking use included cigarettes. She started smoking about 63 years ago. She has been exposed to tobacco smoke. She has never used smokeless tobacco. She reports that she does not drink alcohol and does not use drugs.  ROS: A complete review of systems was performed.  All systems are negative except for pertinent findings as noted.  Physical Exam:  Vital signs in last 24 hours: @VSRANGES @ General:  Alert and oriented, No acute distress HEENT: Normocephalic, atraumatic Neck: No JVD or lymphadenopathy Cardiovascular: Regular rate  Lungs: Normal inspiratory/expiratory excursion Abdomen: Soft, nontender, nondistended, no abdominal masses Back: No CVA tenderness Extremities: No edema Neurologic: Grossly intact  I have reviewed prior pt notes  I have reviewed notes from her postop hospitalization  I have reviewed urinalysis as well as urine cytology results  I have independently reviewed prior imaging--2 recent CT scans reviewed  Procedure note: Cystoscopy/right double-J stent extraction  After informed consent, patient placed in dorsolithotomy position.  Genitalia and perineum were prepped and draped.  Flexible  cystoscope was advanced in the bladder.  Brief inspection of the bladder was performed systematically.  The stent was visualized, grasped and extracted.  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  Stent removed today  Plan:  Cystoscopy was covered with Cipro  today  Will have her come back in about 6 weeks following renal ultrasound   [name] Erika Lee 11/17/2023, 12:57 PM  [name]. Erika Villella MD

## 2023-11-18 ENCOUNTER — Ambulatory Visit: Admitting: Urology

## 2023-11-18 VITALS — BP 126/76 | HR 61

## 2023-11-18 DIAGNOSIS — N1330 Unspecified hydronephrosis: Secondary | ICD-10-CM

## 2023-11-18 DIAGNOSIS — Z466 Encounter for fitting and adjustment of urinary device: Secondary | ICD-10-CM

## 2023-11-18 LAB — URINALYSIS, ROUTINE W REFLEX MICROSCOPIC
Bilirubin, UA: NEGATIVE
Glucose, UA: NEGATIVE
Ketones, UA: NEGATIVE
Nitrite, UA: NEGATIVE
Specific Gravity, UA: 1.01 (ref 1.005–1.030)
Urobilinogen, Ur: 0.2 mg/dL (ref 0.2–1.0)
pH, UA: 6 (ref 5.0–7.5)

## 2023-11-18 LAB — MICROSCOPIC EXAMINATION

## 2023-11-18 MED ORDER — CIPROFLOXACIN HCL 500 MG PO TABS
500.0000 mg | ORAL_TABLET | Freq: Once | ORAL | Status: AC
  Administered 2023-11-18: 500 mg via ORAL

## 2023-11-18 NOTE — Progress Notes (Unsigned)
 Office Visit Open 11/18/2023 [facility] Myrtha Ates, MD Urology Hydronephrosis of right kidney Dx Referred by Austine Lefort, MD Reason for Visit   Additional Documentation  Vitals: BP 126/76   Pulse 61   LMP  (LMP Unknown)  Flowsheets: NEWS,   MEWS Score,   Vital Signs,   Method of Visit  Encounter Info: Billing Info,   History,   Allergies,   Detailed Report   All Notes  Progress Notes by Trent Frizzle, MD at 11/18/2023  1:30 PM  Author: Trent Frizzle, MD Author Type: Physician Filed: 11/18/2023  2:15 PM  Note Status: Signed Cosign: Cosign Not Required Encounter Date: 11/18/2023  Editor: Trent Frizzle, MD (Physician)             Expand All Collapse All     History of Present Illness: Erika Lee is a 78 y.o. year old female here for follow up of recent (10/22/2023) cystoscopy, right retrograde pyelogram, right ureteroscopy, renal washings and double-J stent placement.  She had a possibility of a right ureteral mass causing the obstruction.  Ureteroscopy was negative as were the ureteral washings.  She did have dilation of this strictured area with the ureteral access sheath-13 [place].  She has had a stent since that time.   She did have to go to the emergency room and be admitted the day after her procedure due to gastric retention.  She was treated overnight with an NG tube.   She has had no right-sided flank pain.  She does have burning in her vaginal area.       Past Medical History:  Diagnosis Date   Anticoagulant long-term use      eliquis    Chronic kidney disease     Chronic sinusitis     Dysrhythmia      A.fib,   GERD (gastroesophageal reflux disease)     Heart failure with preserved ejection fraction (HCC)     Hemorrhoids     History of colitis 10/2016    campylobacter   Hyperlipidemia     Hypertension     Multinodular goiter      per pt has had 2 biopsy's both benign   NSTEMI (non-ST elevated  myocardial infarction) (HCC)      12/2018- normal coronary arteries on cath, poss coronary vasospasm.    Osteoarthritis      "all over"  and [facility] joint left shoulder   Paroxysmal atrial fibrillation [facility]) EP cardiologist--  dr Nunzio Belch    s/p PVI by Dr Nunzio Belch 07/16/2013;   pt first dx 2008,  recurrence 2014 afib/flutter and tachy-brady   Rotator cuff tear, left     Shoulder impingement, left     Status post placement of implantable loop recorder      original placement 07-26-2014;  removal / replacement 10-17-2017  by dr allred   Type 2 diabetes mellitus (HCC)      followed by pcp               Past Surgical History:  Procedure Laterality Date   ATRIAL FIBRILLATION ABLATION N/A 07/16/2013    PVI and CTI ablation by Dr Nunzio Belch   CARDIAC CATHETERIZATION        01/11/2019- Normal coronary arteries.    COLONOSCOPY   09/10/2011    Dr. Larrie Po: normal, 10 year follow up.   CYSTOSCOPY W/ URETERAL STENT PLACEMENT Right 10/22/2023    Procedure: CYSTOSCOPY, WITH RETROGRADE PYELOGRAM, RIGHT URETEROSCOPY, PERFORMANCE OF RIGHT RENAL  WASHINGS AND URETERAL STENT INSERTION;  Surgeon: Trent Frizzle, MD;  [facility];  Service: Urology;  Laterality: Right;   implantable loop recorder removal   04/25/2021    MDT LINQ removed by Dr Nunzio Belch   LEFT HEART CATH AND CORONARY ANGIOGRAPHY N/A 01/11/2019    Procedure: LEFT HEART CATH AND CORONARY ANGIOGRAPHY;  Surgeon: Avanell Leigh, MD;  [facility];  Service: Cardiovascular;  Laterality: N/A;   LESION REMOVAL N/A 05/03/2013    Procedure: EXCISION CYST, BACK;  Surgeon: Beau Bound, MD;  Location: [facility];  Service: General;  Laterality: N/A;   LOOP RECORDER IMPLANT N/A 07/26/2014    Procedure: LOOP RECORDER IMPLANT;  Surgeon: Jolly Needle, MD;  Location: [facility] CATH LAB;  Service: Cardiovascular;  Laterality: N/A;   LOOP RECORDER INSERTION N/A 10/17/2017    MDT previously implanted ILR for afib management at [facility].  old device removed and  new device placed by Dr Nunzio Belch   LOOP RECORDER REMOVAL N/A 10/17/2017    MDT ILR removed with new device subsequently replaced   SHOULDER ARTHROSCOPY WITH ROTATOR CUFF REPAIR AND SUBACROMIAL DECOMPRESSION Left 01/07/2019    Procedure: Left shoulder subacromial decompression, distal clavicle resection, extensive debridement,;  Surgeon: Janeth Medicus, MD;  Location: [facility];  Service: Orthopedics;  Laterality: Left;   TEE WITHOUT CARDIOVERSION N/A 07/15/2013    Procedure: TRANSESOPHAGEAL ECHOCARDIOGRAM (TEE);  Surgeon: Lenise Quince, MD;  Location: [facility] ENDOSCOPY;  Service: Cardiovascular;  Laterality: N/A;   TONSILLECTOMY   age 47   TRANSANAL HEMORRHOIDAL DEARTERIALIZATION N/A 09/22/2015    Procedure: TRANSANAL HEMORRHOIDAL LIGATION/PEXY EUA POSSIBLE HEMORRHOIDECTOMY ;  Surgeon: Candyce Champagne, MD;  Location[facility];  Service: General;  Laterality: N/A;          Home Medications:   (Not in a hospital admission)         Allergies:  Allergies       Allergies  Allergen Reactions   Clindamycin /Lincomycin Rash   Doxycycline Other (See Comments)      Makes heart race   Keflex  [Cephalexin ] Nausea And Vomiting   Other     Sulfonic Acid (3,5-Dibromo-4-H-Ox-Benz)     Adhesive [Tape] Rash   Latex Rash   Sulfonamide Derivatives Nausea And Vomiting             Family History  Problem Relation Age of Onset   Stroke Mother 66   Stroke Maternal Grandmother          stroke   Other Maternal Grandfather          "heart dropsy" kidney issues   Kidney disease Maternal Grandfather     Colon cancer Neg Hx            Social History:  reports that she quit smoking about 29 years ago. Her smoking use included cigarettes. She started smoking about 63 years ago. She has been exposed to tobacco smoke. She has never used smokeless tobacco. She reports that she does not drink alcohol and does not use drugs.   ROS: A complete review of systems was performed.  All  systems are negative except for pertinent findings as noted.   Physical Exam:  Vital signs in last 24 hours: @VSRANGES @ General:  Alert and oriented, No acute distress HEENT: Normocephalic, atraumatic Neck: No JVD or lymphadenopathy Cardiovascular: Regular rate  Lungs: Normal inspiratory/expiratory excursion Abdomen: Soft, nontender, nondistended, no abdominal masses Back: No CVA tenderness Extremities: No edema Neurologic: Grossly intact  I have reviewed prior pt notes   I have reviewed notes from her postop hospitalization   I have reviewed urinalysis as well as urine cytology results   I have independently reviewed prior imaging--2 recent CT scans reviewed   Procedure note: Cystoscopy/right double-J stent extraction   After informed consent, patient placed in dorsolithotomy position.  Genitalia and perineum were prepped and draped.   Flexible cystoscope was advanced in the bladder.  Brief inspection of the bladder was performed systematically.  The stent was visualized, grasped and extracted.   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  Stent removed today   Plan:  Cystoscopy was covered with Cipro  today   Will have her come back in about 6 weeks following renal ultrasound     [name] Dahlstedt 11/17/2023, 12:57 PM  [name]. Dahlstedt MD

## 2023-11-19 ENCOUNTER — Other Ambulatory Visit: Payer: Self-pay

## 2023-11-19 ENCOUNTER — Telehealth: Payer: Self-pay | Admitting: Urology

## 2023-11-19 ENCOUNTER — Encounter (HOSPITAL_COMMUNITY): Payer: Self-pay

## 2023-11-19 ENCOUNTER — Emergency Department (HOSPITAL_COMMUNITY)
Admission: EM | Admit: 2023-11-19 | Discharge: 2023-11-19 | Disposition: A | Discharge status: Home / Self Care | Attending: Emergency Medicine | Admitting: Emergency Medicine

## 2023-11-19 DIAGNOSIS — Z7901 Long term (current) use of anticoagulants: Secondary | ICD-10-CM | POA: Insufficient documentation

## 2023-11-19 DIAGNOSIS — Z91040 Latex allergy status: Secondary | ICD-10-CM | POA: Diagnosis not present

## 2023-11-19 DIAGNOSIS — R55 Syncope and collapse: Secondary | ICD-10-CM | POA: Insufficient documentation

## 2023-11-19 LAB — CBC WITH DIFFERENTIAL/PLATELET
Abs Immature Granulocytes: 0.03 10*3/uL (ref 0.00–0.07)
Basophils Absolute: 0 10*3/uL (ref 0.0–0.1)
Basophils Relative: 0 %
Eosinophils Absolute: 0.1 10*3/uL (ref 0.0–0.5)
Eosinophils Relative: 2 %
HCT: 38.4 % (ref 36.0–46.0)
Hemoglobin: 13 g/dL (ref 12.0–15.0)
Immature Granulocytes: 0 %
Lymphocytes Relative: 23 %
Lymphs Abs: 1.8 10*3/uL (ref 0.7–4.0)
MCH: 30.2 pg (ref 26.0–34.0)
MCHC: 33.9 g/dL (ref 30.0–36.0)
MCV: 89.1 fL (ref 80.0–100.0)
Monocytes Absolute: 0.8 10*3/uL (ref 0.1–1.0)
Monocytes Relative: 10 %
Neutro Abs: 5 10*3/uL (ref 1.7–7.7)
Neutrophils Relative %: 65 %
Platelets: 160 10*3/uL (ref 150–400)
RBC: 4.31 MIL/uL (ref 3.87–5.11)
RDW: 13.1 % (ref 11.5–15.5)
WBC: 7.7 10*3/uL (ref 4.0–10.5)
nRBC: 0 % (ref 0.0–0.2)

## 2023-11-19 LAB — COMPREHENSIVE METABOLIC PANEL WITH GFR
ALT: 11 U/L (ref 0–44)
AST: 15 U/L (ref 15–41)
Albumin: 4 g/dL (ref 3.5–5.0)
Alkaline Phosphatase: 71 U/L (ref 38–126)
Anion gap: 7 (ref 5–15)
BUN: 16 mg/dL (ref 8–23)
CO2: 24 mmol/L (ref 22–32)
Calcium: 9.6 mg/dL (ref 8.9–10.3)
Chloride: 100 mmol/L (ref 98–111)
Creatinine, Ser: 1.34 mg/dL — ABNORMAL HIGH (ref 0.44–1.00)
GFR, Estimated: 40 mL/min — ABNORMAL LOW (ref >60)
Glucose, Bld: 190 mg/dL — ABNORMAL HIGH (ref 70–99)
Potassium: 3.8 mmol/L (ref 3.5–5.1)
Sodium: 131 mmol/L — ABNORMAL LOW (ref 135–145)
Total Bilirubin: 0.7 mg/dL (ref 0.0–1.2)
Total Protein: 6.4 g/dL — ABNORMAL LOW (ref 6.5–8.1)

## 2023-11-19 LAB — URINALYSIS, ROUTINE W REFLEX MICROSCOPIC
Bacteria, UA: NONE SEEN
Bilirubin Urine: NEGATIVE
Glucose, UA: NEGATIVE mg/dL
Hgb urine dipstick: NEGATIVE
Ketones, ur: NEGATIVE mg/dL
Nitrite: NEGATIVE
Protein, ur: NEGATIVE mg/dL
Specific Gravity, Urine: 1.019 (ref 1.005–1.030)
pH: 5 (ref 5.0–8.0)

## 2023-11-19 LAB — CBG MONITORING, ED: Glucose-Capillary: 194 mg/dL — ABNORMAL HIGH (ref 70–99)

## 2023-11-19 NOTE — Telephone Encounter (Signed)
 FYI and advise

## 2023-11-19 NOTE — ED Triage Notes (Signed)
 Pt arrived via POV from home c/o feeling weak and experiencing near syncope today after getting up to fix herself something to eat. Pt reports having a stent removed yesterday and felt fine. Pt reports she took 1 of her pain pills today and reports it helped with her right flank pain.

## 2023-11-19 NOTE — Telephone Encounter (Signed)
 Can you please call pt in my absence and advise her of MD recommendations

## 2023-11-19 NOTE — ED Provider Notes (Signed)
 [place] EMERGENCY DEPARTMENT AT [facility] Provider Note   CSN: [id number] Arrival date & time: 11/19/23  1817     History  Chief Complaint  Patient presents with   Near Syncope    Erika Lee is a 78 y.o. female.   Near Syncope   This patient is an 78 year old female who has recently been treated by urology for a ureteral obstruction secondary to a clot, there was no cancer and there was no stone however the patient did require stenting, that was recently, yesterday the stent came out and since that time she has had some fluctuating pain in the right lower quadrant and suprapubic region in the back.  She was placed on tamsulosin and some pain medication and overall had done better in fact she took a nap today and woke up feeling better.  While she was preparing dinner she developed a feeling of syncope and felt like she might of had a syncopal episode.  She felt she had some shaking chills, that is now resolved, she has no symptoms at this time especially chest pain or shortness of breath.  She does not complain of any other urinary symptoms,    Home Medications Prior to Admission medications   Medication Sig Start Date End Date Taking? Authorizing Provider  tamsulosin (FLOMAX) 0.4 MG CAPS capsule Take by mouth. 11/18/23  Yes [provider]  amLODipine  (NORVASC ) 5 MG tablet Take 1 tablet (5 mg total) by mouth daily. 09/22/23   Austine Lefort, MD  apixaban  (ELIQUIS ) 5 MG TABS tablet Take 1 tablet (5 mg total) by mouth 2 (two) times daily. 10/28/23   Austine Lefort, MD  azelastine  (ASTELIN ) 0.1 % nasal spray Place 1 spray into both nostrils 2 (two) times daily. Use in each nostril as directed- helps with postnasal drip 09/03/23   Amadeo June, MD  diazepam  (VALIUM ) 5 MG tablet TAKE 1 TABLET(5 MG) BY MOUTH AT BEDTIME AS NEEDED FOR SLEEP 10/03/23   Austine Lefort, MD  estradiol  (ESTRACE ) 0.1 MG/GM vaginal cream Place 1 Applicatorful vaginally at  bedtime. Patient taking differently: Place 1 Applicatorful vaginally at bedtime as needed (irritation.). 08/08/23   Austine Lefort, MD  glucose blood (FREESTYLE PRECISION NEO TEST) test strip DX: E11.9 Use to check blood sugar daily 10/13/23   Austine Lefort, MD  HYDROcodone -acetaminophen  (NORCO) 10-325 MG tablet Take 1 tablet by mouth every 6 (six) hours as needed. 11/03/23   Austine Lefort, MD  Lancets (FREESTYLE) lancets Use to check blood sugar daily. 12/31/22   Austine Lefort, MD  lisinopril  (ZESTRIL ) 40 MG tablet Take 1 tablet (40 mg total) by mouth daily. 10/28/23   Austine Lefort, MD  ondansetron  (ZOFRAN ) 4 MG tablet Take 1 tablet (4 mg total) by mouth every 8 (eight) hours as needed for nausea or vomiting. 10/24/23   Amadeo June, MD  oxybutynin  (DITROPAN ) 5 MG tablet 1 tab po q 8 hrs prn frequent feeling of need to urinate 10/22/23   Trent Frizzle, MD  promethazine  (PHENERGAN ) 12.5 MG tablet Take 1 tablet (12.5 mg total) by mouth every 6 (six) hours as needed for nausea or vomiting. 10/28/23   Austine Lefort, MD  simvastatin  (ZOCOR ) 40 MG tablet Take 1 tablet (40 mg total) by mouth daily at 6 PM. 06/12/23   Austine Lefort, MD  spironolactone  (ALDACTONE ) 25 MG tablet Take 0.5 tablets (12.5 mg total) by mouth daily. 10/28/23   Austine Lefort, MD  Allergies    Clindamycin /lincomycin; Doxycycline; Keflex  [cephalexin ]; Other; Sulfonic acid (3,5-dibromo-4-h-ox-benz); Adhesive [tape]; Latex; and Sulfonamide derivatives    Review of Systems   Review of Systems  Cardiovascular:  Positive for near-syncope.  All other systems reviewed and are negative.   Physical Exam Updated Vital Signs BP 130/64   Pulse (!) 57   Temp 97.8 F (36.6 C) (Temporal)   Resp 16   Ht 1.6 m (5\' 3" )   Wt 57.2 kg   LMP  (LMP Unknown)   SpO2 95%   BMI 22.34 kg/m  Physical Exam Vitals and nursing note reviewed.  Constitutional:      General: She is not in acute distress.     Appearance: She is well-developed.  HENT:     Head: Normocephalic and atraumatic.     Mouth/Throat:     Pharynx: No oropharyngeal exudate.  Eyes:     General: No scleral icterus.       Right eye: No discharge.        Left eye: No discharge.     Conjunctiva/sclera: Conjunctivae normal.     Pupils: Pupils are equal, round, and reactive to light.  Neck:     Thyroid : No thyromegaly.     Vascular: No JVD.  Cardiovascular:     Rate and Rhythm: Normal rate and regular rhythm.     Heart sounds: Normal heart sounds. No murmur heard.    No friction rub. No gallop.  Pulmonary:     Effort: Pulmonary effort is normal. No respiratory distress.     Breath sounds: Normal breath sounds. No wheezing or rales.  Abdominal:     General: Bowel sounds are normal. There is no distension.     Palpations: Abdomen is soft. There is no mass.     Tenderness: There is no abdominal tenderness.  Musculoskeletal:        General: No tenderness. Normal range of motion.     Cervical back: Normal range of motion and neck supple.     Right lower leg: No edema.     Left lower leg: No edema.  Lymphadenopathy:     Cervical: No cervical adenopathy.  Skin:    General: Skin is warm and dry.     Findings: No erythema or rash.  Neurological:     General: No focal deficit present.     Mental Status: She is alert.     Coordination: Coordination normal.  Psychiatric:        Behavior: Behavior normal.     ED Results / Procedures / Treatments   Labs (all labs ordered are listed, but only abnormal results are displayed) Labs Reviewed  COMPREHENSIVE METABOLIC PANEL WITH GFR - Abnormal; Notable for the following components:      Result Value   Sodium 131 (*)    Glucose, Bld 190 (*)    Creatinine, Ser 1.34 (*)    Total Protein 6.4 (*)    GFR, Estimated 40 (*)    All other components within normal limits  URINALYSIS, ROUTINE W REFLEX MICROSCOPIC - Abnormal; Notable for the following components:   Leukocytes,Ua  TRACE (*)    All other components within normal limits  CBG MONITORING, ED - Abnormal; Notable for the following components:   Glucose-Capillary 194 (*)    All other components within normal limits  CBC WITH DIFFERENTIAL/PLATELET    EKG EKG Interpretation Date/Time:  Wednesday Nov 19 2023 21:54:29 EDT Ventricular Rate:  58 PR Interval:    QRS Duration:  141 QT Interval:  473 QTC Calculation: 465 R Axis:   92  Text Interpretation: Normal sinus rhythm rate 58 Right bundle branch block since last tracing no significant change Confirmed by Early Glisson ([id number]) on 11/19/2023 9:58:25 PM  Radiology No results found.  Procedures Procedures    Medications Ordered in ED Medications - No data to display  ED Course/ Medical Decision Making/ A&P                                 Medical Decision Making Amount and/or Complexity of Data Reviewed Labs: ordered. ECG/medicine tests: ordered.    This patient presents to the ED for concern of syncope differential diagnosis includes vagal as the patient has been having some pain, unlikely to be other causes such as anemia or cardiac cause, no palpitations    Additional history obtained:  Additional history obtained from medical record External records from outside source obtained and reviewed including recent urological procedure notes   Lab Tests:  I Ordered, and personally interpreted labs.  The pertinent results include: CBC unremarkable, metabolic panel unremarkable, urinalysis without infection, hyperglycemic to 190 and creatinine of 1.3 which seems to be close to the patient's baseline.  Sodium of 131, no leukocytosis      Medicines ordered and prescription drug management:  ardiac monitoring reveals normal sinus rhythm, EKG unremarkable other than mild right bundle branch block Reevaluation of the patient after these medicines showed that the patient improved I have reviewed the patients home medicines and have made  adjustments as needed   Problem List / ED Course:  I suspect she had a vagal event, vital signs unremarkable, will give 500 cc bolus due to borderline low blood pressure, no signs of sepsis, patient agreeable.  Patient has a unremarkable workup, labs been totally reassuring, vital signs remain normal and the EKG was unremarkable   Social Determinants of Health:  Recent urinary manipulation, elderly           Final Clinical Impression(s) / ED Diagnoses Final diagnoses:  Near syncope    Rx / DC Orders ED Discharge Orders     None         Early Glisson, MD 11/19/23 2316

## 2023-11-19 NOTE — Telephone Encounter (Signed)
 Patient left message she had stent removed and she is having pain in lower right side of her stomach and back

## 2023-11-19 NOTE — Telephone Encounter (Addendum)
 Patient is made aware detailed voiced message. Patient called back. Patient state's she is felling better after taking medication. Patient is made aware if pain continued to please call back and make our office aware so we can order ultrasound. Patient voiced understanding. Thanks for letting me know.  For now, tell her she can take Advil or Tylenol  for pain.  If it continues past today, might be worthwhile sending her for ultrasound.

## 2023-11-19 NOTE — Discharge Instructions (Signed)
 Thankfully your testing was reassuring, there is nothing abnormal, no signs of urine infection, blood work looked good, vital signs look good, EKG looks good  Please drink plenty of clear liquids and follow-up with your doctor in the next 48 hours, return to the ER for severe or worsening symptoms

## 2023-11-20 ENCOUNTER — Telehealth: Payer: Self-pay

## 2023-11-20 NOTE — Telephone Encounter (Signed)
 Copied from CRM [id number]. Topic: Appointments - Appointment Scheduling >> Nov 20, 2023 11:06 AM Georgeann Kindred wrote: Patient called adamant about speaking to someone in the office. She would like Yenette to give her call. Patient was in the hospital at [facility] and requesting a sooner appt with Dr. Cheril Cork. Offered an appt for June 5th but patient refused and wanted something sooner.Would like a callback from Moriarty in front office.

## 2023-11-28 ENCOUNTER — Ambulatory Visit (HOSPITAL_COMMUNITY)
Admission: RE | Admit: 2023-11-28 | Discharge: 2023-11-28 | Disposition: A | Discharge status: Home / Self Care | Source: Ambulatory Visit | Attending: Urology | Admitting: Urology

## 2023-11-28 DIAGNOSIS — N1330 Unspecified hydronephrosis: Secondary | ICD-10-CM | POA: Diagnosis not present

## 2023-12-07 NOTE — Progress Notes (Unsigned)
 Cardiology Office Note Date:  12/07/2023  Patient ID:  Erika Lee, Erika Lee 1945-07-05, MRN [id number] PCP:  Austine Lefort, MD  Electrophysiologist:  Dr. Nunzio Belch >> Dr. Marven Slimmer   Chief Complaint:    f/u on low HRs  History of Present Illness: Erika Lee is a 78 y.o. female with history of DM, GERD, HTN, AFlutter/AFib (s/p CTI and PVI ablation 2015), July 2020 suffered NSTEMI > echo noted preserved LVEF with new WMA > cath with normal coronaries, possibly had spasm.  She saw Dr. Nunzio Belch, 01/17/22, she was caregiver to her husband who had a  stroke recently home from SNF, she had previously mentioned some degree of fatigue and SOB with prior unrevealing w/u so her toprol  was stopped.  Planned for 6 mo visit.  I saw her Oct 2023 She is primary caregiver for her husband who unfortunately has suffered a catastrophic stroke, requiring full care.  She reports that she has always had just a little swelling at her ankles, but about 3 mo or so ago, started to notice more and more. Since the unna boots were removed she says they did look better, have been about the same since then, not better or worse. She has lost a few pounds though. No CP, denies any unusual SOB. She mentioned some SOB when she saw Dr. Nunzio Belch in July, stopping the Toprol  did not change it, but it isnt worse either. Not so SOB that she has to stop or rest. No palpitations or Afib No near syncope or syncope. PMD following her diuretics, planned to update her echo Planned to transition to Dr. Marven Slimmer  LVEF 65-70%, no WMA, RV OK, , no sign VHD  She saw Dr. Marven Slimmer 08/07/22, fluctuation in HR/rhythm, unable to tell if/when she is in AFib, compliant with Eliquis .  Mentioned on Jardiance , off metoprolol .  No changes were made.  Saw Dr. Cheril Cork 05/05/23, caring for her husband was weighing on her, tired. + edema, no SOB/nocturnal symptoms Planned for labs HR was 46 BP 130/72   I saw he 06/02/23 She continues to be the caregiver  for her husband now over a year post stroke, requiring total care. She denies CP, palpitations or cardiac awareness No dizzy spells, no near syncope or syncope. She does get SOB with heavier activities, gives an example of bring the trash cans to the road, or carrying groceries , not with usual ADLs or care for her husband. No bleeding or signs of bleeding Her observation of HRs at home 40's-50's when she checks EKG was SB 48, RBBB Ambulated in the office HR >> 68-73bpm, O2 sats 95-97%, she did report feeling a bit SOB, did not have to stop Planned for a monitor did not suspect she needed a pacer in the short term at least On no nodal blocking agents  Monitor looked OK.  HRs were OK with average rate 56bpm  No AFib  I saw her 08/04/23 She reports that in d/w Hospice team, suspect her husband had started the dying process Not eating much, she continues to be the primary caregiver for him She hopes the process will be painless and peacful for him, very hard to watch. No CP, palpitations Her back bothers her, hoping to get an injection again soon. No near syncope or syncope She is tired The same DOE, not escalating, the same examples of bring the trash cans to the road, or carrying groceries get her winded.  No SOB DOE otherwise Not felt to be overtly  volume OL No changes were made Planned for 4 mo f/u  ER visit 10/01/23: feeling weak/jittery, recent passing of her husband, not eating as usual, outpt w/u for weight loss in progress, pending EGD/colonoscopy, mammogram and cystoscopy for some hematuria  UA suspect for UTI, labs evaluation felt to be reassuring, Rx cipro , with plans discharged from the ER w/close PMD followup though she signed out AMA prior to formal d/c  10/22/23: cystoscopy right retrograde ureteropyelogram, fluoroscopic interpretation, right ureteroscopy, performance of right renal washings, placement of 6 French by 24 cm contour double-J stent without tether   Admitted  10/22/23 : post  procedure N/V 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 *** eliquis , bleeding, dose, labs *** volume?  AFib/AAD hx Diagnosed 2008 Flecainide  failed to maintain SR (2015) PVI ablation 07/16/2013  Device information: MDT ILR implanted 10/17/17, AFib surveillance  RRT Removed 04/25/21   Past Medical History:  Diagnosis Date   Anticoagulant long-term use    eliquis    Chronic kidney disease    Chronic sinusitis    Dysrhythmia    A.fib,   GERD (gastroesophageal reflux disease)    Heart failure with preserved ejection fraction (HCC)    Hemorrhoids    History of colitis 10/2016   campylobacter   Hyperlipidemia    Hypertension    Multinodular goiter    per pt has had 2 biopsy's both benign   NSTEMI (non-ST elevated myocardial infarction) (HCC)    12/2018- normal coronary arteries on cath, poss coronary vasospasm.    Osteoarthritis    "all over"  and [facility] joint left shoulder   Paroxysmal atrial fibrillation [facility]) EP cardiologist--  dr Nunzio Belch   s/p PVI by Dr Nunzio Belch 07/16/2013;   pt first dx 2008,  recurrence 2014 afib/flutter and tachy-brady   Rotator cuff tear, left    Shoulder impingement, left    Status post placement of implantable loop recorder    original placement 07-26-2014;  removal / replacement 10-17-2017  by dr allred   Type 2 diabetes mellitus (HCC)    followed by pcp    Past Surgical History:  Procedure Laterality Date   ATRIAL FIBRILLATION ABLATION N/A 07/16/2013   PVI and CTI ablation by Dr Nunzio Belch   CARDIAC CATHETERIZATION     01/11/2019- Normal coronary arteries.    COLONOSCOPY   09/10/2011   Dr. Larrie Po: normal, 10 year follow up.   CYSTOSCOPY W/ URETERAL STENT PLACEMENT Right 10/22/2023   Procedure: CYSTOSCOPY, WITH RETROGRADE PYELOGRAM, RIGHT URETEROSCOPY, PERFORMANCE OF RIGHT RENAL WASHINGS AND URETERAL STENT INSERTION;  Surgeon: Trent Frizzle, MD;  Location: [facility] ORS;  Service: Urology;  Laterality: Right;   implantable loop recorder removal  04/25/2021   MDT LINQ removed by Dr Nunzio Belch   LEFT HEART CATH AND CORONARY ANGIOGRAPHY N/A 01/11/2019   Procedure: LEFT HEART CATH AND CORONARY ANGIOGRAPHY;  Surgeon: Avanell Leigh, MD;  Location: [facility] INVASIVE CV LAB;  Service: Cardiovascular;  Laterality: N/A;   LESION REMOVAL N/A 05/03/2013   Procedure: EXCISION CYST, BACK;  Surgeon: Beau Bound, MD;  Location: [facility];  Service: General;  Laterality: N/A;   LOOP RECORDER IMPLANT N/A 07/26/2014   Procedure: LOOP RECORDER  IMPLANT;  Surgeon: Jolly Needle, MD;  Location: [facility] CATH LAB;  Service: Cardiovascular;  Laterality: N/A;   LOOP RECORDER INSERTION N/A 10/17/2017   MDT previously implanted ILR for afib management at RRT.  old device removed and new device placed by Dr Nunzio Belch   LOOP RECORDER REMOVAL N/A 10/17/2017   MDT ILR removed with new device subsequently replaced   SHOULDER ARTHROSCOPY WITH ROTATOR CUFF REPAIR AND SUBACROMIAL DECOMPRESSION Left 01/07/2019   Procedure: Left shoulder subacromial decompression, distal clavicle resection, extensive debridement,;  Surgeon: Janeth Medicus, MD;  Location: [facility];  Service: Orthopedics;  Laterality: Left;   TEE WITHOUT CARDIOVERSION N/A 07/15/2013   Procedure: TRANSESOPHAGEAL ECHOCARDIOGRAM (TEE);  Surgeon: Lenise Quince, MD;  Location: [facility] ENDOSCOPY;  Service: Cardiovascular;  Laterality: N/A;   TONSILLECTOMY  age 74   TRANSANAL HEMORRHOIDAL DEARTERIALIZATION N/A 09/22/2015   Procedure: TRANSANAL HEMORRHOIDAL LIGATION/PEXY EUA POSSIBLE HEMORRHOIDECTOMY ;  Surgeon: Candyce Champagne, MD;   Location: [facility];  Service: General;  Laterality: N/A;    Current Outpatient Medications  Medication Sig Dispense Refill   amLODipine  (NORVASC ) 5 MG tablet Take 1 tablet (5 mg total) by mouth daily. 90 tablet 3   apixaban  (ELIQUIS ) 5 MG TABS tablet Take 1 tablet (5 mg total) by mouth 2 (two) times daily. 180 tablet 3   azelastine  (ASTELIN ) 0.1 % nasal spray Place 1 spray into both nostrils 2 (two) times daily. Use in each nostril as directed- helps with postnasal drip 30 mL 1   diazepam  (VALIUM ) 5 MG tablet TAKE 1 TABLET(5 MG) BY MOUTH AT BEDTIME AS NEEDED FOR SLEEP 30 tablet 3   estradiol  (ESTRACE ) 0.1 MG/GM vaginal cream Place 1 Applicatorful vaginally at bedtime. (Patient taking differently: Place 1 Applicatorful vaginally at bedtime as needed (irritation.).) 42.5 g 12   glucose blood (FREESTYLE PRECISION NEO TEST) test strip DX: E11.9 Use to check blood sugar daily 100 strip 5   HYDROcodone -acetaminophen  (NORCO) 10-325 MG tablet Take 1 tablet by mouth every 6 (six) hours as needed. 120 tablet 0   Lancets (FREESTYLE) lancets Use to check blood sugar daily. 100 each 5   lisinopril  (ZESTRIL ) 40 MG tablet Take 1 tablet (40 mg total) by mouth daily. 90 tablet 3   ondansetron  (ZOFRAN ) 4 MG tablet Take 1 tablet (4 mg total) by mouth every 8 (eight) hours as needed for nausea or vomiting. 20 tablet 0   oxybutynin  (DITROPAN ) 5 MG tablet 1 tab po q 8 hrs prn frequent feeling of need to urinate 30 tablet 1   promethazine  (PHENERGAN ) 12.5 MG tablet Take 1 tablet (12.5 mg total) by mouth every 6 (six) hours as needed for nausea or vomiting. 30 tablet 3   simvastatin  (ZOCOR ) 40 MG tablet Take 1 tablet (40 mg total) by mouth daily at 6 PM. 90 tablet 2   spironolactone  (ALDACTONE ) 25 MG tablet Take 0.5 tablets (12.5 mg total) by mouth daily. 90 tablet 3   tamsulosin (FLOMAX) 0.4 MG CAPS capsule Take by mouth.     No current facility-administered medications for this visit.    Allergies:    Clindamycin /lincomycin; Doxycycline; Keflex  [cephalexin ]; Other; Sulfonic acid (3,5-dibromo-4-h-ox-benz); Adhesive [tape]; Latex; and Sulfonamide derivatives   Social History:  The patient  reports that she quit smoking about 29 years ago. Her smoking use included cigarettes. She started smoking about 63 years ago. She has been exposed to tobacco smoke. She has never used smokeless tobacco. She reports that she does not drink alcohol and does not  use drugs.   Family History:  The patient's family history includes Kidney disease in her maternal grandfather; Other in her maternal grandfather; Stroke in her maternal grandmother; Stroke (age of onset: 61) in her mother.  ROS:  Please see the history of present illness.    All other systems are reviewed and otherwise negative.   PHYSICAL EXAM:  VS:  LMP  (LMP Unknown)  BMI: There is no height or weight on file to calculate BMI. Well nourished, well developed, in no acute distress  HEENT: normocephalic, atraumatic  Neck: no JVD, carotid bruits or masses Cardiac: *** RRR; no significant murmurs, no rubs, or gallops Lungs: *** CTA b/l, no wheezing, rhonchi or rales  Abd: soft, nontender MS: no deformity or atrophy Ext: *** numerous spider veins, she has some ankle edema, not particularly pitting Skin: warm and dry, no rash Neuro:  No gross deficits appreciated Psych: euthymic mood, full affect   EKG:  not done today  Dec 2024: Monitor HR 40 - 169 bpm, average 56 bpm. 68 SVT, longest 37.9 seconds with an average rate of 116 bpm Occasional supraventricular ectopy, 1.6% Rare ventricular ectopy. No atrial fibrillation.  05/09/22: TTE 1. Left ventricular ejection fraction, by estimation, is 65 to 70%. Left  ventricular ejection fraction by 3D volume is 70 %. The left ventricle has  normal function. The left ventricle has no regional wall motion  abnormalities. Left ventricular diastolic   parameters are consistent with Grade I diastolic  dysfunction (impaired  relaxation).   2. Right ventricular systolic function is normal. The right ventricular  size is normal. There is normal pulmonary artery systolic pressure. The  estimated right ventricular systolic pressure is 31.5 mmHg.   3. Left atrial size was moderately dilated.   4. Right atrial size was moderately dilated.   5. The mitral valve is normal in structure. Trivial mitral valve  regurgitation. No evidence of mitral stenosis.   6. The aortic valve is tricuspid. Aortic valve regurgitation is trivial.  No aortic stenosis is present.   7. The inferior vena cava is normal in size with greater than 50%  respiratory variability, suggesting right atrial pressure of 3 mmHg.   Comparison(s): No significant change from prior study. Prior images  reviewed side by side.   08/02/21: TTE  1. Left ventricular ejection fraction, by estimation, is 60 to 65%. The  left ventricle has normal function. The left ventricle has no regional  wall motion abnormalities. There is mild asymmetric left ventricular  hypertrophy of the basal-septal segment.  Left ventricular diastolic parameters are consistent with Grade I  diastolic dysfunction (impaired relaxation). The average left ventricular  global longitudinal strain is -21.8 %. The global longitudinal strain is  normal.   2. Right ventricular systolic function is normal. The right ventricular  size is normal. There is normal pulmonary artery systolic pressure.   3. Left atrial size was severely dilated.   4. The mitral valve is normal in structure. Trivial mitral valve  regurgitation. No evidence of mitral stenosis.   5. The aortic valve is tricuspid. Aortic valve regurgitation is trivial.  No aortic stenosis is present.   6. The inferior vena cava is normal in size with greater than 50%  respiratory variability, suggesting right atrial pressure of 3 mmHg.    01/11/2019: [facility] IMPRESSION: Ms. Lutterman has normal coronary arteries and  normal LV function.  The etiology of her chest pain, elevated troponins, EKG changes and wall motion and imagine 2D echo are  still unclear.  She has normal LV function by ventriculography.  She may have had coronary spasm.  The sheath was removed and a TR band was placed on the right wrist to achieve patent hemostasis.  The patient left lab in stable condition.  She can be discharged home later today on her home dose of Eliquis  oral anticoagulation.   01/10/2019: TTE IMPRESSIONS  1. Severe akinesis of the left ventricular, mid-apical anteroseptal wall.   2. The left ventricle has low normal systolic function, with an ejection  fraction of 50-55%. The cavity size was normal. There is moderately  increased left ventricular wall thickness. Left ventricular diastolic  Doppler parameters are consistent with  impaired relaxation.   3. The right ventricle has normal systolic function. The cavity was  normal. There is no increase in right ventricular wall thickness.  FINDINGS   Left Ventricle: The left ventricle has low normal systolic function, with  an ejection fraction of 50-55%. The cavity size was normal. There is  moderately increased left ventricular wall thickness. Left ventricular  diastolic Doppler parameters are  consistent with impaired relaxation. Severe akinesis of the left  ventricular, mid-apical anteroseptal wall.   Right Ventricle: The right ventricle has normal systolic function. The  cavity was normal. There is no increase in right ventricular wall  thickness.   Left Atrium: Left atrial size was normal in size.   Right Atrium: Right atrial size was normal in size. Right atrial pressure  is estimated at 10 mmHg.   Interatrial Septum: No atrial level shunt detected by color flow Doppler.   Pericardium: There is no evidence of pericardial effusion.   Mitral Valve: The mitral valve is normal in structure. Mitral valve  regurgitation is trivial by color flow Doppler.    Tricuspid Valve: The tricuspid valve is normal in structure. Tricuspid  valve regurgitation was not visualized by color flow Doppler.   Aortic Valve: The aortic valve is normal in structure. Aortic valve  regurgitation was not visualized by color flow Doppler.   Pulmonic Valve: The pulmonic valve was normal in structure. Pulmonic valve  regurgitation is not visualized by color flow Doppler.   Venous: The inferior vena cava is normal in size with greater than 50%  respiratory variability.    07/15/17: TTE Study Conclusions - Left ventricle: Systolic function was normal. The   estimated ejection fraction was in the range of 55% to   60%. Wall motion was normal; there were no regional wall   motion abnormalities. - Aortic valve: No evidence of vegetation. - Mitral valve: No evidence of vegetation. Mild   regurgitation. - Left atrium: The atrium was mildly dilated. No evidence of   thrombus in the atrial cavity or appendage. - Atrial septum: No defect or patent foramen ovale was   identified. There was redundancy of the septum, with   borderline criteria for aneurysm. - Tricuspid valve: No evidence of vegetation.   Recent Labs: 10/01/2023: TSH 1.372 11/19/2023: ALT 11; BUN 16; Creatinine, Ser 1.34; Hemoglobin 13.0; Platelets 160; Potassium 3.8; Sodium 131  09/22/2023: Cholesterol 143; HDL 46; LDL Cholesterol (Calc) 75; Total CHOL/HDL Ratio 3.1; Triglycerides 136   CrCl cannot be calculated (Unknown ideal weight.).   Wt Readings from Last 3 Encounters:  11/19/23 126 lb 1.7 oz (57.2 kg)  10/28/23 126 lb (57.2 kg)  10/24/23 128 lb 6 oz (58.2 kg)     Other studies reviewed: Additional studies/records reviewed today include: summarized above  ASSESSMENT AND PLAN:  1. Persistent  AFib, Aflutter     S/p CTI and PVI ablation 2015     CHA2DS2Vasc is 4 , on Eliquis , *** appropriately dosed  2. CHF ?HFpEF *** She has some ankle edema (usually wearing support stockings) She says  this wakes/wanes Advised continue support stocking and elevation when seated  3. HTN     *** looks ok  4. Bradycardia 5. RBBB *** No clear symptoms of bradycardia   6. Secondary hypercoagulable state      Disposition: back in 4 mo, sooner if needed    Current medicines are reviewed at length with the patient today.  The patient did not have any concerns regarding medicines.  [place], PA-C 12/07/2023 9:29 AM     [facility] [address] [place] [address] [phone] (office)  [phone] (fax)

## 2023-12-08 ENCOUNTER — Other Ambulatory Visit: Payer: Self-pay

## 2023-12-08 ENCOUNTER — Telehealth: Payer: Self-pay

## 2023-12-08 ENCOUNTER — Other Ambulatory Visit: Payer: Self-pay | Admitting: Family Medicine

## 2023-12-08 NOTE — Telephone Encounter (Signed)
 Pt called in to check on status of this med HYDROcodone -acetaminophen  (NORCO) 10-325 MG tablet [[id number]]. Pt states that pharmacy has not received refill for this med. Pt wanted to know if this could be resent in to pharmacy please.   LOV: 10/28/23  PHARMACY: [facility] - [place], [place] - [address] [place], [place] [address] Phone: [phone]  Fax: [phone] DEA #: -- DAW Reason: --  CB#: [phone]

## 2023-12-09 ENCOUNTER — Encounter: Payer: Self-pay | Admitting: Physician Assistant

## 2023-12-09 ENCOUNTER — Ambulatory Visit: Payer: PPO | Attending: Physician Assistant | Admitting: Physician Assistant

## 2023-12-09 VITALS — BP 138/66 | HR 54 | Ht 63.0 in | Wt 137.6 lb

## 2023-12-09 DIAGNOSIS — I10 Essential (primary) hypertension: Secondary | ICD-10-CM

## 2023-12-09 DIAGNOSIS — I4819 Other persistent atrial fibrillation: Secondary | ICD-10-CM | POA: Diagnosis not present

## 2023-12-09 DIAGNOSIS — I5032 Chronic diastolic (congestive) heart failure: Secondary | ICD-10-CM

## 2023-12-09 DIAGNOSIS — D6869 Other thrombophilia: Secondary | ICD-10-CM

## 2023-12-09 MED ORDER — HYDROCODONE-ACETAMINOPHEN 10-325 MG PO TABS: 1.0000 | ORAL_TABLET | Freq: Four times a day (QID) | ORAL | 0 refills | Status: DC | PRN

## 2023-12-09 NOTE — Patient Instructions (Signed)
 Medication Instructions:    Your physician recommends that you continue on your current medications as directed. Please refer to the Current Medication list given to you today.   *If you need a refill on your cardiac medications before your next appointment, please call your pharmacy*   Lab Work: NONE ORDERED  TODAY    If you have labs (blood work) drawn today and your tests are completely normal, you will receive your results only by: MyChart Message (if you have MyChart) OR A paper copy in the mail If you have any lab test that is abnormal or we need to change your treatment, we will call you to review the results.    Testing/Procedures: NONE ORDERED  TODAY     Follow-Up: At [facility], you and your health needs are our priority.  As part of our continuing mission to provide you with exceptional heart care, our providers are all part of one team.  This team includes your primary Cardiologist (physician) and Advanced Practice Providers or APPs (Physician Assistants and Nurse Practitioners) who all work together to provide you with the care you need, when you need it.  Your next appointment:    6 month(s)   Provider:    You may see Boyce Byes, MD or one of the following Advanced Practice Providers on your designated Care Team:   Mertha Abrahams, [place]      We recommend signing up for the patient portal called "MyChart".  Sign up information is provided on this After Visit Summary.  MyChart is used to connect with patients for Virtual Visits (Telemedicine).  Patients are able to view lab/test results, encounter notes, upcoming appointments, etc.  Non-urgent messages can be sent to your provider as well.   To learn more about what you can do with MyChart, go to [email or web address].   Other Instructions

## 2023-12-10 ENCOUNTER — Encounter (HOSPITAL_COMMUNITY): Payer: Self-pay

## 2023-12-10 ENCOUNTER — Other Ambulatory Visit: Payer: Self-pay

## 2023-12-10 ENCOUNTER — Emergency Department (HOSPITAL_COMMUNITY)
Admission: EM | Admit: 2023-12-10 | Discharge: 2023-12-11 | Disposition: A | Discharge status: Home / Self Care | Attending: Emergency Medicine | Admitting: Emergency Medicine

## 2023-12-10 ENCOUNTER — Emergency Department (HOSPITAL_COMMUNITY)

## 2023-12-10 DIAGNOSIS — S91312A Laceration without foreign body, left foot, initial encounter: Secondary | ICD-10-CM | POA: Diagnosis not present

## 2023-12-10 DIAGNOSIS — S99922A Unspecified injury of left foot, initial encounter: Secondary | ICD-10-CM | POA: Diagnosis not present

## 2023-12-10 DIAGNOSIS — R58 Hemorrhage, not elsewhere classified: Secondary | ICD-10-CM | POA: Diagnosis not present

## 2023-12-10 DIAGNOSIS — M7732 Calcaneal spur, left foot: Secondary | ICD-10-CM | POA: Diagnosis not present

## 2023-12-10 DIAGNOSIS — M2012 Hallux valgus (acquired), left foot: Secondary | ICD-10-CM | POA: Diagnosis not present

## 2023-12-10 DIAGNOSIS — Z7901 Long term (current) use of anticoagulants: Secondary | ICD-10-CM | POA: Insufficient documentation

## 2023-12-10 DIAGNOSIS — Z79899 Other long term (current) drug therapy: Secondary | ICD-10-CM | POA: Diagnosis not present

## 2023-12-10 DIAGNOSIS — W208XXA Other cause of strike by thrown, projected or falling object, initial encounter: Secondary | ICD-10-CM | POA: Diagnosis not present

## 2023-12-10 DIAGNOSIS — Z23 Encounter for immunization: Secondary | ICD-10-CM | POA: Insufficient documentation

## 2023-12-10 DIAGNOSIS — Z91040 Latex allergy status: Secondary | ICD-10-CM | POA: Insufficient documentation

## 2023-12-10 DIAGNOSIS — W19XXXA Unspecified fall, initial encounter: Secondary | ICD-10-CM | POA: Diagnosis not present

## 2023-12-10 DIAGNOSIS — W228XXA Striking against or struck by other objects, initial encounter: Secondary | ICD-10-CM | POA: Diagnosis not present

## 2023-12-10 DIAGNOSIS — R404 Transient alteration of awareness: Secondary | ICD-10-CM | POA: Diagnosis not present

## 2023-12-10 NOTE — ED Triage Notes (Signed)
 Pt bib EMS from home with laceration to top part of left foot. Metal box fell on top of foot when pulled out of closet. EMS placed pressure dressing to control bleeding, pt on blood thinners, bleeding controlled.

## 2023-12-10 NOTE — Telephone Encounter (Signed)
 Provider sent in medication to pharmacy on file .

## 2023-12-11 MED ORDER — TETANUS-DIPHTH-ACELL PERTUSSIS 5-2.5-18.5 LF-MCG/0.5 IM SUSY
0.5000 mL | PREFILLED_SYRINGE | Freq: Once | INTRAMUSCULAR | Status: AC
  Administered 2023-12-11: 0.5 mL via INTRAMUSCULAR
  Filled 2023-12-11: qty 0.5

## 2023-12-11 MED ORDER — LIDOCAINE-EPINEPHRINE (PF) 2 %-1:200000 IJ SOLN
20.0000 mL | Freq: Once | INTRAMUSCULAR | Status: AC
  Administered 2023-12-11: 20 mL
  Filled 2023-12-11: qty 20

## 2023-12-11 NOTE — ED Provider Notes (Signed)
 [place] [facility] AT [facility] Provider Note   CSN: [id number] Arrival date & time: 12/10/23  2227     History  Chief Complaint  Patient presents with   Laceration    Erika Lee is a 78 y.o. female.  The history is provided by the patient.  Patient presents with accidental injury to her left foot.  She reports cleaning out a closet when a metal box fell on the top part of her left foot.  She had significant bleeding because she is on Eliquis .  No other acute traumatic injuries are reported.  Bleeding is now controlled     Home Medications Prior to Admission medications   Medication Sig Start Date End Date Taking? Authorizing Provider  amLODipine  (NORVASC ) 5 MG tablet Take 1 tablet (5 mg total) by mouth daily. 09/22/23   Austine Lefort, MD  apixaban  (ELIQUIS ) 5 MG TABS tablet Take 1 tablet (5 mg total) by mouth 2 (two) times daily. 10/28/23   Austine Lefort, MD  azelastine  (ASTELIN ) 0.1 % nasal spray Place 1 spray into both nostrils 2 (two) times daily. Use in each nostril as directed- helps with postnasal drip 09/03/23   Amadeo June, MD  diazepam  (VALIUM ) 5 MG tablet TAKE 1 TABLET(5 MG) BY MOUTH AT BEDTIME AS NEEDED FOR SLEEP 10/03/23   Austine Lefort, MD  estradiol  (ESTRACE ) 0.1 MG/GM vaginal cream Place 1 Applicatorful vaginally at bedtime. Patient taking differently: Place 1 Applicatorful vaginally at bedtime as needed (irritation.). 08/08/23   Austine Lefort, MD  glucose blood (FREESTYLE PRECISION NEO TEST) test strip DX: E11.9 Use to check blood sugar daily 10/13/23   Austine Lefort, MD  HYDROcodone -acetaminophen  (NORCO) 10-325 MG tablet Take 1 tablet by mouth every 6 (six) hours as needed. 12/09/23   Austine Lefort, MD  Lancets (FREESTYLE) lancets Use to check blood sugar daily. 12/31/22   Austine Lefort, MD  lisinopril  (ZESTRIL ) 40 MG tablet Take 1 tablet (40 mg total) by mouth daily. 10/28/23   Austine Lefort, MD  ondansetron   (ZOFRAN ) 4 MG tablet Take 1 tablet (4 mg total) by mouth every 8 (eight) hours as needed for nausea or vomiting. 10/24/23   Amadeo June, MD  oxybutynin  (DITROPAN ) 5 MG tablet 1 tab po q 8 hrs prn frequent feeling of need to urinate Patient not taking: Reported on 12/09/2023 10/22/23   Trent Frizzle, MD  promethazine  (PHENERGAN ) 12.5 MG tablet Take 1 tablet (12.5 mg total) by mouth every 6 (six) hours as needed for nausea or vomiting. 10/28/23   Austine Lefort, MD  simvastatin  (ZOCOR ) 40 MG tablet Take 1 tablet (40 mg total) by mouth daily at 6 PM. 06/12/23   Austine Lefort, MD  spironolactone  (ALDACTONE ) 25 MG tablet Take 0.5 tablets (12.5 mg total) by mouth daily. 10/28/23   Austine Lefort, MD  tamsulosin (FLOMAX) 0.4 MG CAPS capsule Take by mouth. Patient not taking: Reported on 12/09/2023 11/18/23   [provider]      Allergies    Clindamycin /lincomycin; Doxycycline; Keflex  [cephalexin ]; Other; Sulfonic acid (3,5-dibromo-4-h-ox-benz); Adhesive [tape]; Latex; and Sulfonamide derivatives    Review of Systems   Review of Systems  Physical Exam Updated Vital Signs BP 132/66   Pulse 61   Temp 97.8 F (36.6 C)   Resp 16   Ht 1.6 m (5' 3)   Wt 62.4 kg   LMP  (LMP Unknown)   SpO2 95%   BMI 24.37 kg/m  Physical Exam CONSTITUTIONAL: Elderly and anxious HEAD: Normocephalic/atraumatic EYES: EOMI/PERRL NEURO: Pt is awake/alert/appropriate, moves all extremitiesx4.  No facial droop.   EXTREMITIES: pulses normal/equal, full ROM Laceration noted to the dorsal aspect of the proximal foot.  No active bleeding.  No bone or tendon exposed.  It is clean.  Distal pulses intact.  No significant deformities SKIN: warm, see photo  Patient gave verbal permission to utilize photo for medical documentation only The image was not stored on any personal device    ED Results / Procedures / Treatments   Labs (all labs ordered are listed, but only abnormal results are  displayed) Labs Reviewed - No data to display  EKG None  Radiology DG Foot Complete Left Result Date: 12/10/2023 CLINICAL DATA:  Left foot laceration with pain, initial encounter EXAM: LEFT FOOT - COMPLETE 3+ VIEW COMPARISON:  None Available. FINDINGS: Mild hallux valgus deformity is noted. No acute fracture or dislocation is seen. Soft tissue swelling is noted about the ankle. Calcaneal spurring is seen. No radiopaque foreign body is seen. IMPRESSION: Soft tissue swelling about the foot and ankle. No acute bony abnormality is noted. Electronically Signed   By: Violeta Grey M.D.   On: 12/10/2023 23:44    Procedures .Laceration Repair  Date/Time: 12/11/2023 1:30 AM  Performed by: Eldon Greenland, MD Authorized by: Eldon Greenland, MD   Consent:    Consent obtained:  Verbal   Consent given by:  Patient Universal protocol:    Patient identity confirmed:  Provided demographic data Anesthesia:    Anesthesia method:  Local infiltration   Local anesthetic:  Lidocaine  2% WITH epi Laceration details:    Location:  Foot   Foot location:  Top of L foot   Length (cm):  6 Exploration:    Wound extent: no foreign body, no tendon damage, no underlying fracture and no vascular damage     Contaminated: no   Treatment:    Amount of cleaning:  Standard Skin repair:    Repair method:  Sutures and staples   Suture size:  3-0   Suture material:  Prolene   Suture technique:  Simple interrupted   Number of sutures:  4   Number of staples:  5 Approximation:    Approximation:  Close Repair type:    Repair type:  Simple Post-procedure details:    Procedure completion:  Tolerated well, no immediate complications     Medications Ordered in ED Medications  Tdap (BOOSTRIX) injection 0.5 mL (0.5 mLs Intramuscular Given 12/11/23 0048)  lidocaine -EPINEPHrine  (XYLOCAINE  W/EPI) 2 %-1:200000 (PF) injection 20 mL (20 mLs Infiltration Given 12/11/23 0121)    ED Course/ Medical Decision Making/ A&P                                  Medical Decision Making Amount and/or Complexity of Data Reviewed Radiology: ordered.  Risk Prescription drug management.   Presents with accidental injury to her left foot when a box fell on her foot.  Laceration repaired without difficulty.  There was no evidence of any tendon injury as patient had full flexion extension of the foot and her toes without difficulty.  This was a partial skin tear.  Wound repaired with sutures and staples without difficulty.  I have personally visualized the x-ray and it is negative  Compressive bandage was placed on the wound with good response.  We discussed wound care, and need to follow-up in 14  days for removal of staples and sutures        Final Clinical Impression(s) / ED Diagnoses Final diagnoses:  Laceration of left foot, initial encounter    Rx / DC Orders ED Discharge Orders     None         Eldon Greenland, MD 12/11/23 0231

## 2023-12-12 NOTE — Telephone Encounter (Signed)
 Provider has refilled medication and sent to [facility]. Pt. Has been informed by phone message

## 2023-12-12 NOTE — Telephone Encounter (Unsigned)
 Copied from CRM [id number]. Topic: Clinical - Medical Advice >> Dec 12, 2023 11:35 AM Tiffini S wrote: Reason for CRM: Patient is asking for a call back from [employer] or [facility]. Did not want to include information is message. Ask for call back at [phone].

## 2023-12-15 ENCOUNTER — Encounter: Payer: Self-pay | Admitting: Family Medicine

## 2023-12-15 ENCOUNTER — Ambulatory Visit (INDEPENDENT_AMBULATORY_CARE_PROVIDER_SITE_OTHER): Admitting: Family Medicine

## 2023-12-15 VITALS — BP 122/82 | Ht 63.0 in | Wt 135.0 lb

## 2023-12-15 DIAGNOSIS — L03116 Cellulitis of left lower limb: Secondary | ICD-10-CM

## 2023-12-15 MED ORDER — CEPHALEXIN 500 MG PO CAPS: 500.0000 mg | ORAL_CAPSULE | Freq: Three times a day (TID) | ORAL | 0 refills | Status: DC

## 2023-12-15 NOTE — Progress Notes (Signed)
 Subjective:    Patient ID: Erika Lee, female    DOB: 05-16-1946, 78 y.o.   MRN: [id number]   Patient recently had to go to the hospital after suffering a laceration over her anterior left ankle.  She sustained a V shaped laceration just above the crease of her ankle on Wednesday.  She dropped a metal box on her ankle.  I had a close the laceration in the emergency room with 5 staples and 6 sutures.  Now the skin around the ankle is becoming erythematous and warm and extremely painful. Past Medical History:  Diagnosis Date   Anticoagulant long-term use    eliquis    Chronic kidney disease    Chronic sinusitis    Dysrhythmia    A.fib,   GERD (gastroesophageal reflux disease)    Heart failure with preserved ejection fraction (HCC)    Hemorrhoids    History of colitis 10/2016   campylobacter   Hyperlipidemia    Hypertension    Multinodular goiter    per pt has had 2 biopsy's both benign   NSTEMI (non-ST elevated myocardial infarction) (HCC)    12/2018- normal coronary arteries on cath, poss coronary vasospasm.    Osteoarthritis    all over  and [facility] joint left shoulder   Paroxysmal atrial fibrillation [facility]) EP cardiologist--  dr Nunzio Belch   s/p PVI by Dr Nunzio Belch 07/16/2013;   pt first dx 2008,  recurrence 2014 afib/flutter and tachy-brady   Rotator cuff tear, left    Shoulder impingement, left    Status post placement of implantable loop recorder    original placement 07-26-2014;  removal / replacement 10-17-2017  by dr allred   Type 2 diabetes mellitus (HCC)    followed by pcp   Past Surgical History:  Procedure Laterality Date   ATRIAL FIBRILLATION ABLATION N/A 07/16/2013   PVI and CTI ablation by Dr Nunzio Belch   CARDIAC CATHETERIZATION     01/11/2019- Normal coronary arteries.    COLONOSCOPY  09/10/2011   Dr. Larrie Po: normal, 10 year follow up.   CYSTOSCOPY W/ URETERAL STENT PLACEMENT Right 10/22/2023   Procedure: CYSTOSCOPY, WITH RETROGRADE PYELOGRAM, RIGHT URETEROSCOPY,  PERFORMANCE OF RIGHT RENAL WASHINGS AND URETERAL STENT INSERTION;  Surgeon: Trent Frizzle, MD;  Location: [facility] ORS;  Service: Urology;  Laterality: Right;   implantable loop recorder removal  04/25/2021   MDT LINQ removed by Dr Nunzio Belch   LEFT HEART CATH AND CORONARY ANGIOGRAPHY N/A 01/11/2019   Procedure: LEFT HEART CATH AND CORONARY ANGIOGRAPHY;  Surgeon: Avanell Leigh, MD;  Location: [facility] INVASIVE CV LAB;  Service: Cardiovascular;  Laterality: N/A;   LESION REMOVAL N/A 05/03/2013   Procedure: EXCISION CYST, BACK;  Surgeon: Beau Bound, MD;  Location: [facility] ORS;  Service: General;  Laterality: N/A;   LOOP RECORDER IMPLANT N/A 07/26/2014   Procedure: LOOP RECORDER IMPLANT;  Surgeon: Jolly Needle, MD;  Location: [facility] CATH LAB;  Service: Cardiovascular;  Laterality: N/A;   LOOP RECORDER INSERTION N/A 10/17/2017   MDT previously implanted ILR for afib management at [facility].  old device removed and new device placed by Dr Nunzio Belch   LOOP RECORDER REMOVAL N/A 10/17/2017   MDT ILR removed with new device subsequently replaced   SHOULDER ARTHROSCOPY WITH ROTATOR CUFF REPAIR AND SUBACROMIAL DECOMPRESSION Left 01/07/2019   Procedure: Left shoulder subacromial decompression, distal clavicle resection, extensive debridement,;  Surgeon: Janeth Medicus, MD;  Location: [facility];  Service: Orthopedics;  Laterality: Left;   TEE WITHOUT  CARDIOVERSION N/A 07/15/2013   Procedure: TRANSESOPHAGEAL ECHOCARDIOGRAM (TEE);  Surgeon: Lenise Quince, MD;  Location: [facility] ENDOSCOPY;  Service: Cardiovascular;  Laterality: N/A;   TONSILLECTOMY  age 65   TRANSANAL HEMORRHOIDAL DEARTERIALIZATION N/A 09/22/2015   Procedure: TRANSANAL HEMORRHOIDAL LIGATION/PEXY EUA POSSIBLE HEMORRHOIDECTOMY ;  Surgeon: Candyce Champagne, MD;  Location: [facility]S;  Service: General;  Laterality: N/A;   Current Outpatient Medications on File Prior to Visit  Medication Sig Dispense Refill   amLODipine  (NORVASC ) 5 MG tablet Take 1  tablet (5 mg total) by mouth daily. 90 tablet 3   apixaban  (ELIQUIS ) 5 MG TABS tablet Take 1 tablet (5 mg total) by mouth 2 (two) times daily. 180 tablet 3   azelastine  (ASTELIN ) 0.1 % nasal spray Place 1 spray into both nostrils 2 (two) times daily. Use in each nostril as directed- helps with postnasal drip 30 mL 1   diazepam  (VALIUM ) 5 MG tablet TAKE 1 TABLET(5 MG) BY MOUTH AT BEDTIME AS NEEDED FOR SLEEP 30 tablet 3   estradiol  (ESTRACE ) 0.1 MG/GM vaginal cream Place 1 Applicatorful vaginally at bedtime. (Patient taking differently: Place 1 Applicatorful vaginally at bedtime as needed (irritation.).) 42.5 g 12   glucose blood (FREESTYLE PRECISION NEO TEST) test strip DX: E11.9 Use to check blood sugar daily 100 strip 5   HYDROcodone -acetaminophen  (NORCO) 10-325 MG tablet Take 1 tablet by mouth every 6 (six) hours as needed. 120 tablet 0   Lancets (FREESTYLE) lancets Use to check blood sugar daily. 100 each 5   lisinopril  (ZESTRIL ) 40 MG tablet Take 1 tablet (40 mg total) by mouth daily. 90 tablet 3   ondansetron  (ZOFRAN ) 4 MG tablet Take 1 tablet (4 mg total) by mouth every 8 (eight) hours as needed for nausea or vomiting. 20 tablet 0   promethazine  (PHENERGAN ) 12.5 MG tablet Take 1 tablet (12.5 mg total) by mouth every 6 (six) hours as needed for nausea or vomiting. 30 tablet 3   simvastatin  (ZOCOR ) 40 MG tablet Take 1 tablet (40 mg total) by mouth daily at 6 PM. 90 tablet 2   spironolactone  (ALDACTONE ) 25 MG tablet Take 0.5 tablets (12.5 mg total) by mouth daily. 90 tablet 3   oxybutynin  (DITROPAN ) 5 MG tablet 1 tab po q 8 hrs prn frequent feeling of need to urinate (Patient not taking: Reported on 12/15/2023) 30 tablet 1   tamsulosin (FLOMAX) 0.4 MG CAPS capsule Take by mouth. (Patient not taking: Reported on 12/15/2023)     No current facility-administered medications on file prior to visit.     Allergies  Allergen Reactions   Clindamycin /Lincomycin Rash   Doxycycline Other (See Comments)     Makes heart race   Keflex  [Cephalexin ] Nausea And Vomiting   Other    Sulfonic Acid (3,5-Dibromo-4-H-Ox-Benz)    Adhesive [Tape] Rash   Latex Rash   Sulfonamide Derivatives Nausea And Vomiting   Social History   Socioeconomic History   Marital status: Married    Spouse name: Carloyn Chi   Number of children: 2   Years of education: 12   Highest education level: 12th grade  Occupational History   Occupation: Advertising copywriter: RETIRED    Comment: Full time  Tobacco Use   Smoking status: Former    Current packs/day: 0.00    Types: Cigarettes    Start date: 01/03/1960    Quit date: 01/02/1994    Years since quitting: 29.9    Passive exposure: Past   Smokeless tobacco: Never  Vaping Use   Vaping status: Never Used  Substance and Sexual Activity   Alcohol use: No   Drug use: No   Sexual activity: Not Currently    Partners: Male  Other Topics Concern   Not on file  Social History Narrative   One child deceased from homicide at age 62    Social Drivers of Health   Financial Resource Strain: Low Risk  (05/21/2022)   Overall Financial Resource Strain (CARDIA)    Difficulty of Paying Living Expenses: Not hard at all  Food Insecurity: No Food Insecurity (10/23/2023)   Hunger Vital Sign    Worried About Running Out of Food in the Last Year: Never true    Ran Out of Food in the Last Year: Never true  Transportation Needs: No Transportation Needs (10/23/2023)   PRAPARE - Administrator, Civil Service (Medical): No    Lack of Transportation (Non-Medical): No  Physical Activity: Inactive (05/21/2022)   Exercise Vital Sign    Days of Exercise per Week: 0 days    Minutes of Exercise per Session: 0 min  Stress: Stress Concern Present (05/21/2022)   [employer] of Occupational Health - Occupational Stress Questionnaire    Feeling of Stress : To some extent  Social Connections: Moderately Isolated (10/23/2023)   Social Connection and Isolation  Panel    Frequency of Communication with Friends and Family: More than three times a week    Frequency of Social Gatherings with Friends and Family: Once a week    Attends Religious Services: 1 to 4 times per year    Active Member of [employer] or Organizations: No    Attends Banker Meetings: Never    Marital Status: Widowed  Intimate Partner Violence: Not At Risk (10/23/2023)   Humiliation, Afraid, Rape, and Kick questionnaire    Fear of Current or Ex-Partner: No    Emotionally Abused: No    Physically Abused: No    Sexually Abused: No     Review of Systems  All other systems reviewed and are negative.      Objective:   Physical Exam Vitals reviewed.  Constitutional:      General: She is not in acute distress.    Appearance: Normal appearance. She is not ill-appearing or toxic-appearing.   Cardiovascular:     Rate and Rhythm: Normal rate and regular rhythm.     Pulses: Normal pulses.     Heart sounds: Normal heart sounds. No murmur heard.    No friction rub. No gallop.  Pulmonary:     Effort: Pulmonary effort is normal. No respiratory distress.     Breath sounds: No stridor. No wheezing, rhonchi or rales.  Abdominal:     General: Abdomen is flat. Bowel sounds are normal. There is no distension.     Palpations: Abdomen is soft.     Tenderness: There is no abdominal tenderness. There is no guarding.   Musculoskeletal:     Cervical back: Neck supple.     Left ankle: Swelling, deformity and laceration present. Tenderness present.   Skin:    Findings: Erythema and wound present.       Neurological:     General: No focal deficit present.     Mental Status: She is alert and oriented to person, place, and time.     Cranial Nerves: No cranial nerve deficit.     Sensory: No sensory deficit.     Motor: No weakness.     Coordination:  Coordination normal.     Gait: Gait normal.     Deep Tendon Reflexes: Reflexes normal.   Psychiatric:        Mood and Affect:  Mood normal.        Thought Content: Thought content normal.           Assessment & Plan:  Cellulitis of left lower extremity I believe the patient has cellulitis in the lower leg.  Started the patient on Keflex  500 mg p.o. 3 times daily for 7 days.  Patient has numerous allergies antibiotics.  Her allergy to Keflex  has been nausea.  She will try it again.  Recommended she return on Monday for suture removal and staple removal or sooner if getting worse.

## 2023-12-19 ENCOUNTER — Ambulatory Visit: Payer: Self-pay

## 2023-12-19 NOTE — Telephone Encounter (Signed)
 Called pt per MD Dahlstedt request pt stated she is feeling fine but wanted to know what obstruction MD is talking about pt advised a message will be sent  to the provider and she will here something back by Monday or Tuesday of next week pt stated she did not want another surgery pt advised MD would be notified

## 2023-12-19 NOTE — Telephone Encounter (Signed)
-----   Message from Nurse [facility] sent at 12/19/2023  2:24 PM EDT -----  ----- Message ----- From: Trent Frizzle, MD Sent: 12/19/2023  11:40 AM EDT To: Roselee Cong, LPN; Dorma Gash, CMA  Let pt know that u/s showed some obstruction of rt kidney--how is she feeling? ----- Message ----- From: Interface, Rad Results In Sent: 12/06/2023  10:26 PM EDT To: Trent Frizzle, MD

## 2023-12-22 ENCOUNTER — Encounter: Payer: Self-pay | Admitting: Family Medicine

## 2023-12-22 ENCOUNTER — Ambulatory Visit (INDEPENDENT_AMBULATORY_CARE_PROVIDER_SITE_OTHER): Admitting: Family Medicine

## 2023-12-22 VITALS — BP 138/80 | HR 57 | Temp 98.0°F | Ht 63.0 in | Wt 140.2 lb

## 2023-12-22 DIAGNOSIS — L03116 Cellulitis of left lower limb: Secondary | ICD-10-CM | POA: Diagnosis not present

## 2023-12-22 NOTE — Progress Notes (Signed)
 Subjective:    Patient ID: Erika Lee, female    DOB: 06-12-1946, 78 y.o.   MRN: [id number]  12/16/23 Patient recently had to go to the hospital after suffering a laceration over her anterior left ankle.  She sustained a V shaped laceration just above the crease of her ankle on Wednesday.  She dropped a metal box on her ankle.  I had a close the laceration in the emergency room with 5 staples and 6 sutures.  Now the skin around the ankle is becoming erythematous and warm and extremely painful.  At that time, my plan was: I believe the patient has cellulitis in the lower leg.  Started the patient on Keflex  500 mg p.o. 3 times daily for 7 days.  Patient has numerous allergies antibiotics.  Her allergy to Keflex  has been nausea.  She will try it again.  Recommended she return on Monday for suture removal and staple removal or sooner if getting worse.  12/22/23 Patient is here today for suture removal.  The wound appears much less red and inflamed compared to last week.  Keflex  seems to be helping.  She denies any fever or chills.  The wound is still tender to touch but there is no evidence of secondary cellulitis. Past Medical History:  Diagnosis Date   Anticoagulant long-term use    eliquis    Chronic kidney disease    Chronic sinusitis    Dysrhythmia    A.fib,   GERD (gastroesophageal reflux disease)    Heart failure with preserved ejection fraction (HCC)    Hemorrhoids    History of colitis 10/2016   campylobacter   Hyperlipidemia    Hypertension    Multinodular goiter    per pt has had 2 biopsy's both benign   NSTEMI (non-ST elevated myocardial infarction) (HCC)    12/2018- normal coronary arteries on cath, poss coronary vasospasm.    Osteoarthritis    all over  and [facility] joint left shoulder   Paroxysmal atrial fibrillation [facility]) EP cardiologist--  dr kelsie   s/p PVI by Dr kelsie 07/16/2013;   pt first dx 2008,  recurrence 2014 afib/flutter and tachy-brady   Rotator cuff tear,  left    Shoulder impingement, left    Status post placement of implantable loop recorder    original placement 07-26-2014;  removal / replacement 10-17-2017  by dr allred   Type 2 diabetes mellitus (HCC)    followed by pcp   Past Surgical History:  Procedure Laterality Date   ATRIAL FIBRILLATION ABLATION N/A 07/16/2013   PVI and CTI ablation by Dr kelsie   CARDIAC CATHETERIZATION     01/11/2019- Normal coronary arteries.    COLONOSCOPY  09/10/2011   Dr. Mavis: normal, 10 year follow up.   CYSTOSCOPY W/ URETERAL STENT PLACEMENT Right 10/22/2023   Procedure: CYSTOSCOPY, WITH RETROGRADE PYELOGRAM, RIGHT URETEROSCOPY, PERFORMANCE OF RIGHT RENAL WASHINGS AND URETERAL STENT INSERTION;  Surgeon: Matilda Senior, MD;  Location: [facility];  Service: Urology;  Laterality: Right;   implantable loop recorder removal  04/25/2021   MDT LINQ removed by Dr kelsie   LEFT HEART CATH AND CORONARY ANGIOGRAPHY N/A 01/11/2019   Procedure: LEFT HEART CATH AND CORONARY ANGIOGRAPHY;  Surgeon: Court Dorn PARAS, MD;  [facility];  Service: Cardiovascular;  Laterality: N/A;   LESION REMOVAL N/A 05/03/2013   Procedure: EXCISION CYST, BACK;  Surgeon: Oneil DELENA Mavis, MD;  Location: [facility];  Service: General;  Laterality: N/A;  LOOP RECORDER IMPLANT N/A 07/26/2014   Procedure: LOOP RECORDER IMPLANT;  Surgeon: Lynwood Rakers, MD;  Location: [facility] CATH LAB;  Service: Cardiovascular;  Laterality: N/A;   LOOP RECORDER INSERTION N/A 10/17/2017   MDT previously implanted ILR for afib management at [facility].  old device removed and new device placed by Dr Rakers   LOOP RECORDER REMOVAL N/A 10/17/2017   MDT ILR removed with new device subsequently replaced   SHOULDER ARTHROSCOPY WITH ROTATOR CUFF REPAIR AND SUBACROMIAL DECOMPRESSION Left 01/07/2019   Procedure: Left shoulder subacromial decompression, distal clavicle resection, extensive debridement,;  Surgeon: Sharl Selinda Dover, MD;  Location: [facility];  Service: Orthopedics;  Laterality: Left;   TEE WITHOUT CARDIOVERSION N/A 07/15/2013   Procedure: TRANSESOPHAGEAL ECHOCARDIOGRAM (TEE);  Surgeon: Redell GORMAN Shallow, MD;  Location: [facility] ENDOSCOPY;  Service: Cardiovascular;  Laterality: N/A;   TONSILLECTOMY  age 37   TRANSANAL HEMORRHOIDAL DEARTERIALIZATION N/A 09/22/2015   Procedure: TRANSANAL HEMORRHOIDAL LIGATION/PEXY EUA POSSIBLE HEMORRHOIDECTOMY ;  Surgeon: Elspeth Schultze, MD;  Location: [facility];  Service: General;  Laterality: N/A;       Allergies  Allergen Reactions   Clindamycin /Lincomycin Rash   Doxycycline Other (See Comments)    Makes heart race   Keflex  [Cephalexin ] Nausea And Vomiting   Other    Sulfonic Acid (3,5-Dibromo-4-H-Ox-Benz)    Adhesive [Tape] Rash   Latex Rash   Sulfonamide Derivatives Nausea And Vomiting   Social History   Socioeconomic History   Marital status: Married    Spouse name: Interior and spatial designer   Number of children: 2   Years of education: 12   Highest education level: 12th grade  Occupational History   Occupation: Advertising copywriter: RETIRED    Comment: Full time  Tobacco Use   Smoking status: Former    Current packs/day: 0.00    Types: Cigarettes    Start date: 01/03/1960    Quit date: 01/02/1994    Years since quitting: 29.9    Passive exposure: Past   Smokeless tobacco: Never  Vaping Use   Vaping status: Never Used  Substance and Sexual Activity   Alcohol use: No   Drug use: No   Sexual activity: Not Currently    Partners: Male  Other Topics Concern   Not on file  Social History Narrative   One child deceased from homicide at age 31    Social Drivers of Health   Financial Resource Strain: Low Risk  (05/21/2022)   Overall Financial Resource Strain (CARDIA)    Difficulty of Paying Living Expenses: Not hard at all  Food Insecurity: No Food Insecurity (10/23/2023)   Hunger Vital Sign    Worried About Running Out of Food in the Last Year: Never true    Ran Out  of Food in the Last Year: Never true  Transportation Needs: No Transportation Needs (10/23/2023)   PRAPARE - Administrator, Civil Service (Medical): No    Lack of Transportation (Non-Medical): No  Physical Activity: Inactive (05/21/2022)   Exercise Vital Sign    Days of Exercise per Week: 0 days    Minutes of Exercise per Session: 0 min  Stress: Stress Concern Present (05/21/2022)   [employer] of Occupational Health - Occupational Stress Questionnaire    Feeling of Stress : To some extent  Social Connections: Moderately Isolated (10/23/2023)   Social Connection and Isolation Panel    Frequency of Communication with Friends and Family: More than three times a week  Frequency of Social Gatherings with Friends and Family: Once a week    Attends Religious Services: 1 to 4 times per year    Active Member of [employer] or Organizations: No    Attends Banker Meetings: Never    Marital Status: Widowed  Intimate Partner Violence: Not At Risk (10/23/2023)   Humiliation, Afraid, Rape, and Kick questionnaire    Fear of Current or Ex-Partner: No    Emotionally Abused: No    Physically Abused: No    Sexually Abused: No     Review of Systems  All other systems reviewed and are negative.      Objective:   Physical Exam Vitals reviewed.  Constitutional:      General: She is not in acute distress.    Appearance: Normal appearance. She is not ill-appearing or toxic-appearing.   Cardiovascular:     Rate and Rhythm: Normal rate and regular rhythm.     Pulses: Normal pulses.     Heart sounds: Normal heart sounds. No murmur heard.    No friction rub. No gallop.  Pulmonary:     Effort: Pulmonary effort is normal. No respiratory distress.     Breath sounds: No stridor. No wheezing, rhonchi or rales.  Abdominal:     General: Abdomen is flat. Bowel sounds are normal. There is no distension.     Palpations: Abdomen is soft.     Tenderness: There is no abdominal  tenderness. There is no guarding.   Musculoskeletal:     Cervical back: Neck supple.     Left ankle: Swelling, deformity and laceration present. Tenderness present.   Skin:    Findings: Wound present. No erythema.   Neurological:     General: No focal deficit present.     Mental Status: She is alert and oriented to person, place, and time.     Cranial Nerves: No cranial nerve deficit.     Sensory: No sensory deficit.     Motor: No weakness.     Coordination: Coordination normal.     Gait: Gait normal.     Deep Tendon Reflexes: Reflexes normal.   Psychiatric:        Mood and Affect: Mood normal.        Thought Content: Thought content normal.           Assessment & Plan:  Cellulitis of left lower extremity The cellulitis in the left lower extremity appears to be resolving.  No additional antibiotics are required.  I removed 5 staples without difficulty.  I removed 4 sutures without difficulty.  I then reinforced the wound with Steri-Strips.  Wound care was discussed.  Recommended she continue to keep wound covered and clean freezing up 7 days until the repair has had additional time to strengthen.

## 2023-12-22 NOTE — Telephone Encounter (Signed)
 Called pt to give MD Dahlstedt advisement and to confirm that she wanted to have a CT competed per MD recommendation  pt wanted to know if her right kidney was swollen pt advised that a message would be sent to the provider pt also stated she was okay with doing a CT

## 2023-12-29 ENCOUNTER — Other Ambulatory Visit: Payer: Self-pay | Admitting: Urology

## 2023-12-29 DIAGNOSIS — N1330 Unspecified hydronephrosis: Secondary | ICD-10-CM

## 2023-12-30 ENCOUNTER — Telehealth: Payer: Self-pay

## 2023-12-30 NOTE — Telephone Encounter (Signed)
 Pt called to see if she still needs to come to her 07/08 appt pt has a CT scheduled for 07/09 pt advised that we will more than likely reschedule her appt for 07/08 but I will confirm w/ MD Dahlstedt

## 2024-01-05 ENCOUNTER — Ambulatory Visit: Payer: Self-pay | Admitting: Family Medicine

## 2024-01-05 ENCOUNTER — Encounter: Payer: Self-pay | Admitting: Family Medicine

## 2024-01-05 ENCOUNTER — Ambulatory Visit (INDEPENDENT_AMBULATORY_CARE_PROVIDER_SITE_OTHER): Admitting: Family Medicine

## 2024-01-05 VITALS — BP 126/82 | HR 59 | Temp 98.5°F | Ht 63.0 in | Wt 137.0 lb

## 2024-01-05 DIAGNOSIS — K219 Gastro-esophageal reflux disease without esophagitis: Secondary | ICD-10-CM | POA: Diagnosis not present

## 2024-01-05 DIAGNOSIS — L089 Local infection of the skin and subcutaneous tissue, unspecified: Secondary | ICD-10-CM | POA: Diagnosis not present

## 2024-01-05 DIAGNOSIS — R300 Dysuria: Secondary | ICD-10-CM

## 2024-01-05 DIAGNOSIS — R109 Unspecified abdominal pain: Secondary | ICD-10-CM | POA: Diagnosis not present

## 2024-01-05 LAB — URINALYSIS, ROUTINE W REFLEX MICROSCOPIC
Bilirubin Urine: NEGATIVE
Glucose, UA: NEGATIVE
Hgb urine dipstick: NEGATIVE
Hyaline Cast: NONE SEEN /LPF
Ketones, ur: NEGATIVE
Nitrite: NEGATIVE
Protein, ur: NEGATIVE
RBC / HPF: NONE SEEN /HPF (ref 0–2)
Specific Gravity, Urine: 1.01 (ref 1.001–1.035)
pH: 6.5 (ref 5.0–8.0)

## 2024-01-05 LAB — MICROSCOPIC MESSAGE

## 2024-01-05 MED ORDER — CEPHALEXIN 500 MG PO CAPS: 500.0000 mg | ORAL_CAPSULE | Freq: Three times a day (TID) | ORAL | 0 refills | Status: DC

## 2024-01-05 MED ORDER — ONDANSETRON HCL 4 MG PO TABS: 4.0000 mg | ORAL_TABLET | Freq: Three times a day (TID) | ORAL | 0 refills | Status: AC | PRN

## 2024-01-05 NOTE — Progress Notes (Signed)
 Patient Office Visit  Assessment & Plan:  Abdominal pain, unspecified abdominal location -     Comprehensive metabolic panel with GFR -     CBC with Differential/Platelet -     Urinalysis, Routine w reflex microscopic -     Urine Culture; Future -     Ondansetron  HCl; Take 1 tablet (4 mg total) by mouth every 8 (eight) hours as needed for nausea or vomiting.  Dispense: 30 tablet; Refill: 0 -     Lipase  Dysuria -     Urinalysis, Routine w reflex microscopic -     Urine Culture; Future  Gastroesophageal reflux disease without esophagitis  Local skin infection -     Cephalexin ; Take 1 capsule (500 mg total) by mouth 3 (three) times daily.  Dispense: 21 capsule; Refill: 0   Assessment and Plan         Abdominal Pain Nausea and abdominal pain likely related to reflux or post-surgical effects. Infection unlikely without fever. - Order blood work and urinalysis. - Prescribe Zofran  for nausea.  Reflux Disease Reflux managed with Nexium . Symptoms may be related to reflux. - Continue Nexium  daily.  Left lower leg Injury Foot injury with inflammation. On Eliquis , affecting healing. Prescribed Keflex  for potential infection. - Prescribe Keflex  for one week.  Knee Osteoarthritis Scheduled gel injections with previous success. Anticipates improved function and pain relief. - Administer gel injections as scheduled.  General Health Maintenance Prediabetic, abstinent from smoking and alcohol. - Encourage continued abstinence from smoking and alcohol.  Follow-up Follow-up with urologist on August 19th. CT scan scheduled for Wednesday. - Ensure follow-up with urologist on August 19th. - Complete CT scan as scheduled.      Return if symptoms worsen or fail to improve.   Subjective:    Patient ID: Erika Lee, female    DOB: 10/29/1945  Age: 78 y.o. MRN: [id number]  Chief Complaint  Patient presents with   Nausea    Pt c/o of abdominal pain that is making her nauseous.  She has not been able to eat much. She last ate yesterday.    Laceration    Pt has a laceration on her L foot that she wants checked.     Laceration    Discussed the use of AI scribe software for clinical note transcription with the patient, who gave verbal consent to proceed.  History of Present Illness        The patient is an 78 year old with a history of stomach issues who presents with nausea and abdominal pain since Saturday  She has been experiencing nausea and abdominal pain associated with eating since Saturday. The pain occurs with any food intake, including after consuming a bowl of cereal and a dinner of steamed potatoes and meat. No vomiting has occurred. She reports normal bowel movements but experiences frequent nocturnal urination which is normal for her. She takes Nexium  daily for reflux and has Zofran  available for nausea, which was previously prescribed. She thinks she needs refill of Zofran   In April, she underwent urological surgery and subsequently experienced significant abdominal distension due to air trapped in her stomach, necessitating an NG tube for decompression. She is currently following up with her urologist and has a CT scan scheduled for her kidney this month  Her weight has fluctuated, currently at 137 pounds, down from 140 pounds, but up from 128 pounds after her husband's death. She is on Eliquis , a blood thinner.  She quit smoking in 1995 and does  not consume alcohol. She has a history of knee issues and is scheduled for gel injections in her knees this month. Physical Exam MEASUREMENTS: Weight- 137. SKIN: Redness on the foot, no pus, not infected. Results LABS Urinalysis: Hematuria  RADIOLOGY CT scan kidney: Not specified (12/31/2023) Assessment & Plan Abdominal Pain Nausea and abdominal pain likely related to reflux or post-surgical effects. Infection unlikely without fever. - Order blood work and urinalysis. - Prescribe Zofran  for  nausea.  Reflux Disease Reflux managed with Nexium . Symptoms may be related to reflux. - Continue Nexium  daily.  Left left injury Left lower leg injury with inflammation. On Eliquis , affecting healing. Prescribed Keflex  for potential infection. Patient does have prediabetes - Prescribe Keflex  for one week.  Knee Osteoarthritis Scheduled gel injections with previous success. Anticipates improved function and pain relief. - Administer gel injections as scheduled.  General Health Maintenance Prediabetic, abstinent from smoking and alcohol. - Encourage continued abstinence from smoking and alcohol.  Follow-up Follow-up with urologist on August 19th. CT scan scheduled for Wednesday. - Ensure follow-up with urologist on August 19th. - Complete CT scan as scheduled.    The ASCVD Risk score (Arnett DK, et al., 2019) failed to calculate for the following reasons:   The 2019 ASCVD risk score is only valid for ages 67 to 34   Risk score cannot be calculated because patient has a medical history suggesting prior/existing ASCVD  Past Medical History:  Diagnosis Date   Anticoagulant long-term use    eliquis    Chronic kidney disease    Chronic sinusitis    Dysrhythmia    A.fib,   GERD (gastroesophageal reflux disease)    Heart failure with preserved ejection fraction (HCC)    Hemorrhoids    History of colitis 10/2016   campylobacter   Hyperlipidemia    Hypertension    Multinodular goiter    per pt has had 2 biopsy's both benign   NSTEMI (non-ST elevated myocardial infarction) (HCC)    12/2018- normal coronary arteries on cath, poss coronary vasospasm.    Osteoarthritis    all over  and [facility] joint left shoulder   Paroxysmal atrial fibrillation [facility]) EP cardiologist--  dr kelsie   s/p PVI by Dr kelsie 07/16/2013;   pt first dx 2008,  recurrence 2014 afib/flutter and tachy-brady   Rotator cuff tear, left    Shoulder impingement, left    Status post placement of implantable loop  recorder    original placement 07-26-2014;  removal / replacement 10-17-2017  by dr allred   Type 2 diabetes mellitus (HCC)    followed by pcp   Past Surgical History:  Procedure Laterality Date   ATRIAL FIBRILLATION ABLATION N/A 07/16/2013   PVI and CTI ablation by Dr kelsie   CARDIAC CATHETERIZATION     01/11/2019- Normal coronary arteries.    COLONOSCOPY  09/10/2011   Dr. Mavis: normal, 10 year follow up.   CYSTOSCOPY W/ URETERAL STENT PLACEMENT Right 10/22/2023   Procedure: CYSTOSCOPY, WITH RETROGRADE PYELOGRAM, RIGHT URETEROSCOPY, PERFORMANCE OF RIGHT RENAL WASHINGS AND URETERAL STENT INSERTION;  Surgeon: Matilda Senior, MD;  [facility]: [facility];  Service: Urology;  Laterality: Right;   implantable loop recorder removal  04/25/2021   MDT LINQ removed by Dr kelsie   LEFT HEART CATH AND CORONARY ANGIOGRAPHY N/A 01/11/2019   Procedure: LEFT HEART CATH AND CORONARY ANGIOGRAPHY;  Surgeon: Court Dorn PARAS, MD;  [facility];  Service: Cardiovascular;  Laterality: N/A;   LESION REMOVAL N/A 05/03/2013  Procedure: EXCISION CYST, BACK;  Surgeon: Oneil DELENA Budge, MD;  Location: [facility];  Service: General;  Laterality: N/A;   LOOP RECORDER IMPLANT N/A 07/26/2014   Procedure: LOOP RECORDER IMPLANT;  Surgeon: Lynwood Rakers, MD;  Location: [facility] CATH LAB;  Service: Cardiovascular;  Laterality: N/A;   LOOP RECORDER INSERTION N/A 10/17/2017   MDT previously implanted ILR for afib management at [facility].  old device removed and new device placed by Dr Rakers   LOOP RECORDER REMOVAL N/A 10/17/2017   MDT ILR removed with new device subsequently replaced   SHOULDER ARTHROSCOPY WITH ROTATOR CUFF REPAIR AND SUBACROMIAL DECOMPRESSION Left 01/07/2019   Procedure: Left shoulder subacromial decompression, distal clavicle resection, extensive debridement,;  Surgeon: Sharl Selinda Dover, MD;  Location: [facility];  Service: Orthopedics;  Laterality: Left;   TEE WITHOUT CARDIOVERSION  N/A 07/15/2013   Procedure: TRANSESOPHAGEAL ECHOCARDIOGRAM (TEE);  Surgeon: Redell GORMAN Shallow, MD;  Location: [facility] ENDOSCOPY;  Service: Cardiovascular;  Laterality: N/A;   TONSILLECTOMY  age 35   TRANSANAL HEMORRHOIDAL DEARTERIALIZATION N/A 09/22/2015   Procedure: TRANSANAL HEMORRHOIDAL LIGATION/PEXY EUA POSSIBLE HEMORRHOIDECTOMY ;  Surgeon: Elspeth Schultze, MD;  Location: [facility];  Service: General;  Laterality: N/A;   Social History   Tobacco Use   Smoking status: Former    Current packs/day: 0.00    Types: Cigarettes    Start date: 01/03/1960    Quit date: 01/02/1994    Years since quitting: 30.0    Passive exposure: Past   Smokeless tobacco: Never  Vaping Use   Vaping status: Never Used  Substance Use Topics   Alcohol use: No   Drug use: No   Family History  Problem Relation Age of Onset   Stroke Mother 43   Stroke Maternal Grandmother        stroke   Other Maternal Grandfather        heart dropsy kidney issues   Kidney disease Maternal Grandfather    Colon cancer Neg Hx    Allergies  Allergen Reactions   Clindamycin /Lincomycin Rash   Doxycycline Other (See Comments)    Makes heart race   Keflex  [Cephalexin ] Nausea And Vomiting   Other    Sulfonic Acid (3,5-Dibromo-4-H-Ox-Benz)    Adhesive [Tape] Rash   Latex Rash   Sulfonamide Derivatives Nausea And Vomiting    ROS    Objective:    BP 126/82   Pulse (!) 59   Temp 98.5 F (36.9 C)   Ht 5' 3 (1.6 m)   Wt 137 lb (62.1 kg)   LMP  (LMP Unknown)   SpO2 99%   BMI 24.27 kg/m  BP Readings from Last 3 Encounters:  01/05/24 126/82  12/22/23 138/80  12/15/23 122/82   Wt Readings from Last 3 Encounters:  01/05/24 137 lb (62.1 kg)  12/22/23 140 lb 3.2 oz (63.6 kg)  12/15/23 135 lb (61.2 kg)    Physical Exam Vitals and nursing note reviewed.  Constitutional:      Appearance: Normal appearance.  HENT:     Head: Normocephalic.     Right Ear: Tympanic membrane, ear canal and external ear normal.     Left Ear:  Tympanic membrane, ear canal and external ear normal.  Eyes:     Extraocular Movements: Extraocular movements intact.     Conjunctiva/sclera: Conjunctivae normal.     Pupils: Pupils are equal, round, and reactive to light.  Cardiovascular:     Rate and Rhythm: Normal rate and regular rhythm.  Heart sounds: Normal heart sounds.  Pulmonary:     Effort: Pulmonary effort is normal.     Breath sounds: Normal breath sounds.  Abdominal:     General: Bowel sounds are normal. There is no distension.     Tenderness: There is abdominal tenderness in the epigastric area. There is no right CVA tenderness, left CVA tenderness, guarding or rebound.  Musculoskeletal:     Right lower leg: No edema.     Left lower leg: No edema.  Skin:    Findings: Bruising and erythema present.     Comments: Left lower leg near ankle laceration is healing well but has surrounding erythema. No purulence noted.   Neurological:     General: No focal deficit present.     Mental Status: She is alert and oriented to person, place, and time.  Psychiatric:        Mood and Affect: Mood normal.        Behavior: Behavior normal.        Thought Content: Thought content normal.        Judgment: Judgment normal.      No results found for any visits on 01/05/24.

## 2024-01-06 ENCOUNTER — Ambulatory Visit: Admitting: Urology

## 2024-01-06 LAB — CBC WITH DIFFERENTIAL/PLATELET
Absolute Lymphocytes: 1546 {cells}/uL (ref 850–3900)
Absolute Monocytes: 1254 {cells}/uL — ABNORMAL HIGH (ref 200–950)
Basophils Absolute: 45 {cells}/uL (ref 0–200)
Basophils Relative: 0.4 %
Eosinophils Absolute: 202 {cells}/uL (ref 15–500)
Eosinophils Relative: 1.8 %
HCT: 39.3 % (ref 35.0–45.0)
Hemoglobin: 12.8 g/dL (ref 11.7–15.5)
MCH: 29.6 pg (ref 27.0–33.0)
MCHC: 32.6 g/dL (ref 32.0–36.0)
MCV: 90.8 fL (ref 80.0–100.0)
MPV: 9.7 fL (ref 7.5–12.5)
Monocytes Relative: 11.2 %
Neutro Abs: 8154 {cells}/uL — ABNORMAL HIGH (ref 1500–7800)
Neutrophils Relative %: 72.8 %
Platelets: 239 Thousand/uL (ref 140–400)
RBC: 4.33 Million/uL (ref 3.80–5.10)
RDW: 12.9 % (ref 11.0–15.0)
Total Lymphocyte: 13.8 %
WBC: 11.2 Thousand/uL — ABNORMAL HIGH (ref 3.8–10.8)

## 2024-01-06 LAB — COMPREHENSIVE METABOLIC PANEL WITH GFR
AG Ratio: 1.8 (calc) (ref 1.0–2.5)
ALT: 9 U/L (ref 6–29)
AST: 13 U/L (ref 10–35)
Albumin: 4.4 g/dL (ref 3.6–5.1)
Alkaline phosphatase (APISO): 80 U/L (ref 37–153)
BUN/Creatinine Ratio: 11 (calc) (ref 6–22)
BUN: 15 mg/dL (ref 7–25)
CO2: 26 mmol/L (ref 20–32)
Calcium: 10 mg/dL (ref 8.6–10.4)
Chloride: 98 mmol/L (ref 98–110)
Creat: 1.31 mg/dL — ABNORMAL HIGH (ref 0.60–0.95)
Globulin: 2.5 g/dL (ref 1.9–3.7)
Glucose, Bld: 105 mg/dL — ABNORMAL HIGH (ref 65–99)
Potassium: 4.6 mmol/L (ref 3.5–5.3)
Sodium: 135 mmol/L (ref 135–146)
Total Bilirubin: 0.5 mg/dL (ref 0.2–1.2)
Total Protein: 6.9 g/dL (ref 6.1–8.1)
eGFR: 41 mL/min/1.73m2 — ABNORMAL LOW (ref >=60)

## 2024-01-06 LAB — URINE CULTURE
MICRO NUMBER:: 16665047
Result:: NO GROWTH
SPECIMEN QUALITY:: ADEQUATE

## 2024-01-06 LAB — LIPASE: Lipase: 26 U/L (ref 7–60)

## 2024-01-07 ENCOUNTER — Ambulatory Visit (HOSPITAL_COMMUNITY)
Admission: RE | Admit: 2024-01-07 | Discharge: 2024-01-07 | Disposition: A | Discharge status: Home / Self Care | Source: Ambulatory Visit | Attending: Urology | Admitting: Urology

## 2024-01-07 ENCOUNTER — Telehealth: Payer: Self-pay

## 2024-01-07 DIAGNOSIS — K8020 Calculus of gallbladder without cholecystitis without obstruction: Secondary | ICD-10-CM | POA: Diagnosis not present

## 2024-01-07 DIAGNOSIS — D251 Intramural leiomyoma of uterus: Secondary | ICD-10-CM | POA: Diagnosis not present

## 2024-01-07 DIAGNOSIS — N1330 Unspecified hydronephrosis: Secondary | ICD-10-CM | POA: Diagnosis not present

## 2024-01-07 DIAGNOSIS — K838 Other specified diseases of biliary tract: Secondary | ICD-10-CM | POA: Diagnosis not present

## 2024-01-07 MED ORDER — IOHEXOL 300 MG/ML  SOLN
80.0000 mL | Freq: Once | INTRAMUSCULAR | Status: AC | PRN
Start: 2024-01-07 — End: 2024-01-07
  Administered 2024-01-07: 80 mL via INTRAVENOUS

## 2024-01-07 NOTE — Telephone Encounter (Signed)
 Pt called and stated that her BP was 85/59 yesterday and 125/60 today. Pt states that she thinks she is taking too much blood pressure medication. Pt wants to know if you think she should be on so many meds?

## 2024-01-13 ENCOUNTER — Other Ambulatory Visit: Payer: Self-pay | Admitting: Urology

## 2024-01-13 DIAGNOSIS — N134 Hydroureter: Secondary | ICD-10-CM

## 2024-01-15 ENCOUNTER — Other Ambulatory Visit: Payer: Self-pay | Admitting: Family Medicine

## 2024-01-15 ENCOUNTER — Telehealth: Payer: Self-pay

## 2024-01-15 DIAGNOSIS — M170 Bilateral primary osteoarthritis of knee: Secondary | ICD-10-CM | POA: Diagnosis not present

## 2024-01-15 MED ORDER — HYDROCODONE-ACETAMINOPHEN 10-325 MG PO TABS: 1.0000 | ORAL_TABLET | Freq: Four times a day (QID) | ORAL | 0 refills | Status: DC | PRN

## 2024-01-15 NOTE — Telephone Encounter (Signed)
 Pt came into office to request a refill of this med:   HYDROcodone -acetaminophen  [facility]) 10-325 MG tablet [[id number]]  PHARMACY: [facility] [place], [place] - [address] [place], [place] [address] Phone: [phone]  Fax: [phone] DEA #: -- DAW Reason: --  LOV: 01/05/24 UPCOMING OV 01/22/24  CB#: [phone]

## 2024-01-22 ENCOUNTER — Encounter: Payer: Self-pay | Admitting: Family Medicine

## 2024-01-22 ENCOUNTER — Ambulatory Visit: Admitting: Family Medicine

## 2024-01-22 VITALS — BP 110/64 | HR 54 | Temp 98.0°F | Ht 63.0 in | Wt 141.2 lb

## 2024-01-22 DIAGNOSIS — E118 Type 2 diabetes mellitus with unspecified complications: Secondary | ICD-10-CM | POA: Diagnosis not present

## 2024-01-22 DIAGNOSIS — M170 Bilateral primary osteoarthritis of knee: Secondary | ICD-10-CM | POA: Diagnosis not present

## 2024-01-22 DIAGNOSIS — I5030 Unspecified diastolic (congestive) heart failure: Secondary | ICD-10-CM | POA: Diagnosis not present

## 2024-01-22 DIAGNOSIS — I480 Paroxysmal atrial fibrillation: Secondary | ICD-10-CM | POA: Diagnosis not present

## 2024-01-22 MED ORDER — PANTOPRAZOLE SODIUM 40 MG PO TBEC: 40.0000 mg | DELAYED_RELEASE_TABLET | Freq: Every day | ORAL | 3 refills | Status: AC

## 2024-01-22 NOTE — Progress Notes (Signed)
 Subjective:    Patient ID: Erika Lee, female    DOB: 11-06-1945, 78 y.o.   MRN: [id number] Patient is here today for follow-up of her diabetes, congestive heart failure with preserved ejection fraction, and coronary artery disease.  She denies any chest pain or shortness of breath or dyspnea on exertion.  She does have +1 edema in both ankles.  She recently had discontinue amlodipine  due to hypotension.  She also stopped spironolactone  but she continued to take lisinopril .  Her blood pressure since stopping amlodipine  and spironolactone  has been between 120 and 162 systolic over 55-81.  She denies any dizziness or syncope or near syncope Past Medical History:  Diagnosis Date   Anticoagulant long-term use    eliquis    Chronic kidney disease    Chronic sinusitis    Dysrhythmia    A.fib,   GERD (gastroesophageal reflux disease)    Heart failure with preserved ejection fraction (HCC)    Hemorrhoids    History of colitis 10/2016   campylobacter   Hyperlipidemia    Hypertension    Multinodular goiter    per pt has had 2 biopsy's both benign   NSTEMI (non-ST elevated myocardial infarction) (HCC)    12/2018- normal coronary arteries on cath, poss coronary vasospasm.    Osteoarthritis    all over  and [facility] joint left shoulder   Paroxysmal atrial fibrillation [facility]) EP cardiologist--  dr kelsie   s/p PVI by Dr kelsie 07/16/2013;   pt first dx 2008,  recurrence 2014 afib/flutter and tachy-brady   Rotator cuff tear, left    Shoulder impingement, left    Status post placement of implantable loop recorder    original placement 07-26-2014;  removal / replacement 10-17-2017  by dr allred   Type 2 diabetes mellitus (HCC)    followed by pcp   Past Surgical History:  Procedure Laterality Date   ATRIAL FIBRILLATION ABLATION N/A 07/16/2013   PVI and CTI ablation by Dr kelsie   CARDIAC CATHETERIZATION     01/11/2019- Normal coronary arteries.    COLONOSCOPY  09/10/2011   Dr. Mavis: normal,  10 year follow up.   CYSTOSCOPY W/ URETERAL STENT PLACEMENT Right 10/22/2023   Procedure: CYSTOSCOPY, WITH RETROGRADE PYELOGRAM, RIGHT URETEROSCOPY, PERFORMANCE OF RIGHT RENAL WASHINGS AND URETERAL STENT INSERTION;  Surgeon: Matilda Senior, MD;  Location: [facility];  Service: Urology;  Laterality: Right;   implantable loop recorder removal  04/25/2021   MDT LINQ removed by Dr kelsie   LEFT HEART CATH AND CORONARY ANGIOGRAPHY N/A 01/11/2019   Procedure: LEFT HEART CATH AND CORONARY ANGIOGRAPHY;  Surgeon: Court Dorn PARAS, MD;  Location: [facility] INVAS[facility] CV LAB;  Service: Cardiovascular;  Laterality: N/A;   LESION REMOVAL N/A 05/03/2013   Procedure: EXCISION CYST, BACK;  Surgeon: Oneil DELENA Mavis, MD;  Location: [facility];  Service: General;  Laterality: N/A;   LOOP RECORDER IMPLANT N/A 07/26/2014   Procedure: LOOP RECORDER IMPLANT;  Surgeon: Lynwood kelsie, MD;  Location: [facility] CATH LAB;  Service: Cardiovascular;  Laterality: N/A;   LOOP RECORDER INSERTION N/A 10/17/2017   MDT previously implanted ILR for afib management at [facility].  old device removed and new device placed by Dr kelsie   LOOP RECORDER REMOVAL N/A 10/17/2017   MDT ILR removed with new device subsequently replaced   SHOULDER ARTHROSCOPY WITH ROTATOR CUFF REPAIR AND SUBACROMIAL DECOMPRESSION Left 01/07/2019   Procedure: Left shoulder subacromial decompression, distal clavicle resection, extensive debridement,;  Surgeon: Sharl Selinda Dover, MD;  Location: [facility];  Service: Orthopedics;  Laterality: Left;   TEE WITHOUT CARDIOVERSION N/A 07/15/2013   Procedure: TRANSESOPHAGEAL ECHOCARDIOGRAM (TEE);  Surgeon: Redell GORMAN Shallow, MD;  Location: [facility] ENDOSCOPY;  Service: Cardiovascular;  Laterality: N/A;   TONSILLECTOMY  age 35   TRANSANAL HEMORRHOIDAL DEARTERIALIZATION N/A 09/22/2015   Procedure: TRANSANAL HEMORRHOIDAL LIGATION/PEXY EUA POSSIBLE HEMORRHOIDECTOMY ;  Surgeon: Elspeth Schultze, MD;  Location: [facility];  Service: General;   Laterality: N/A;   Current Outpatient Medications on File Prior to Visit  Medication Sig Dispense Refill   apixaban  (ELIQUIS ) 5 MG TABS tablet Take 1 tablet (5 mg total) by mouth 2 (two) times daily. 180 tablet 3   azelastine  (ASTELIN ) 0.1 % nasal spray Place 1 spray into both nostrils 2 (two) times daily. Use in each nostril as directed- helps with postnasal drip 30 mL 1   cephALEXin  (KEFLEX ) 500 MG capsule Take 1 capsule (500 mg total) by mouth 3 (three) times daily. 21 capsule 0   diazepam  (VALIUM ) 5 MG tablet TAKE 1 TABLET(5 MG) BY MOUTH AT BEDTIME AS NEEDED FOR SLEEP 30 tablet 3   estradiol  (ESTRACE ) 0.1 MG/GM vaginal cream Place 1 Applicatorful vaginally at bedtime. 42.5 g 12   glucose blood (FREESTYLE PRECISION NEO TEST) test strip DX: E11.9 Use to check blood sugar daily 100 strip 5   HYDROcodone -acetaminophen  (NORCO) 10-325 MG tablet Take 1 tablet by mouth every 6 (six) hours as needed. 120 tablet 0   Lancets (FREESTYLE) lancets Use to check blood sugar daily. 100 each 5   lisinopril  (ZESTRIL ) 40 MG tablet Take 1 tablet (40 mg total) by mouth daily. 90 tablet 3   ondansetron  (ZOFRAN ) 4 MG tablet Take 1 tablet (4 mg total) by mouth every 8 (eight) hours as needed for nausea or vomiting. 30 tablet 0   oxybutynin  (DITROPAN ) 5 MG tablet 1 tab po q 8 hrs prn frequent feeling of need to urinate 30 tablet 1   promethazine  (PHENERGAN ) 12.5 MG tablet Take 1 tablet (12.5 mg total) by mouth every 6 (six) hours as needed for nausea or vomiting. 30 tablet 3   simvastatin  (ZOCOR ) 40 MG tablet Take 1 tablet (40 mg total) by mouth daily at 6 PM. 90 tablet 2   tamsulosin (FLOMAX) 0.4 MG CAPS capsule Take by mouth.     amLODipine  (NORVASC ) 5 MG tablet Take 1 tablet (5 mg total) by mouth daily. (Patient not taking: Reported on 01/22/2024) 90 tablet 3   spironolactone  (ALDACTONE ) 25 MG tablet Take 0.5 tablets (12.5 mg total) by mouth daily. (Patient not taking: Reported on 01/22/2024) 90 tablet 3   No  current facility-administered medications on file prior to visit.     Allergies  Allergen Reactions   Clindamycin /Lincomycin Rash   Doxycycline Other (See Comments)    Makes heart race   Keflex  [Cephalexin ] Nausea And Vomiting   Other    Sulfonic Acid (3,5-Dibromo-4-H-Ox-Benz)    Adhesive [Tape] Rash   Latex Rash   Sulfonamide Derivatives Nausea And Vomiting   Social History   Socioeconomic History   Marital status: Married    Spouse name: Garen Dame   Number of children: 2   Years of education: 12   Highest education level: 12th grade  Occupational History   Occupation: Advertising copywriter: RETIRED    Comment: Full time  Tobacco Use   Smoking status: Former    Current packs/day: 0.00    Types: Cigarettes    Start date: 01/03/1960  Quit date: 01/02/1994    Years since quitting: 30.0    Passive exposure: Past   Smokeless tobacco: Never  Vaping Use   Vaping status: Never Used  Substance and Sexual Activity   Alcohol use: No   Drug use: No   Sexual activity: Not Currently    Partners: Male  Other Topics Concern   Not on file  Social History Narrative   One child deceased from homicide at age 53    Social Drivers of Health   Financial Resource Strain: Low Risk  (05/21/2022)   Overall Financial Resource Strain (CARDIA)    Difficulty of Paying Living Expenses: Not hard at all  Food Insecurity: No Food Insecurity (10/23/2023)   Hunger Vital Sign    Worried About Running Out of Food in the Last Year: Never true    Ran Out of Food in the Last Year: Never true  Transportation Needs: No Transportation Needs (10/23/2023)   PRAPARE - Administrator, Civil Service (Medical): No    Lack of Transportation (Non-Medical): No  Physical Activity: Inactive (05/21/2022)   Exercise Vital Sign    Days of Exercise per Week: 0 days    Minutes of Exercise per Session: 0 min  Stress: Stress Concern Present (05/21/2022)   [employer] of Occupational  Health - Occupational Stress Questionnaire    Feeling of Stress : To some extent  Social Connections: Moderately Isolated (10/23/2023)   Social Connection and Isolation Panel    Frequency of Communication with Friends and Family: More than three times a week    Frequency of Social Gatherings with Friends and Family: Once a week    Attends Religious Services: 1 to 4 times per year    Active Member of [employer] or Organizations: No    Attends Banker Meetings: Never    Marital Status: Widowed  Intimate Partner Violence: Not At Risk (10/23/2023)   Humiliation, Afraid, Rape, and Kick questionnaire    Fear of Current or Ex-Partner: No    Emotionally Abused: No    Physically Abused: No    Sexually Abused: No     Review of Systems  All other systems reviewed and are negative.      Objective:   Physical Exam Vitals reviewed.  Constitutional:      General: She is not in acute distress.    Appearance: Normal appearance. She is not ill-appearing or toxic-appearing.  Cardiovascular:     Rate and Rhythm: Normal rate and regular rhythm.     Pulses: Normal pulses.     Heart sounds: Normal heart sounds. No murmur heard.    No friction rub. No gallop.  Pulmonary:     Effort: Pulmonary effort is normal. No respiratory distress.     Breath sounds: No stridor. No wheezing, rhonchi or rales.  Abdominal:     General: Abdomen is flat. Bowel sounds are normal. There is no distension.     Palpations: Abdomen is soft.     Tenderness: There is no abdominal tenderness. There is no guarding.  Musculoskeletal:     Cervical back: Neck supple.  Neurological:     General: No focal deficit present.     Mental Status: She is alert and oriented to person, place, and time.     Cranial Nerves: No cranial nerve deficit.     Sensory: No sensory deficit.     Motor: No weakness.     Coordination: Coordination normal.     Gait: Gait normal.  Deep Tendon Reflexes: Reflexes normal.  Psychiatric:         Mood and Affect: Mood normal.        Thought Content: Thought content normal.           Assessment & Plan:  Controlled type 2 diabetes mellitus with complication, without long-term current use of insulin  (HCC) - Plan: CBC with Differential/Platelet, Comprehensive metabolic panel with GFR, Hemoglobin A1c  Heart failure with preserved ejection fraction, unspecified HF chronicity (HCC)  Paroxysmal atrial fibrillation [facility]) Patient is still seeing urology for hematuria and right-sided hydronephrosis.  Her blood pressure is slightly elevated.  I have asked her to continue lisinopril  and resume spironolactone .  Discontinue amlodipine .  I chose the spironolactone  due to her history of congestive heart failure with preserved ejection fraction.  Patient's heart rate is slightly bradycardic.  Her average heart rate is 52-69 however she is asymptomatic.  She is currently in normal sinus rhythm.  She takes Eliquis  for prevention of stroke.  I will check a hemoglobin A1c today along with a CBC and a CMP.  Ideally I like to see her A1c less than 6.5.  Monitor her renal function along with her hemoglobin.  Patient is medically cleared to discontinue Eliquis  3 days prior to her planned cystoscopy and urologic procedure and then resume the medication afterwards

## 2024-01-23 ENCOUNTER — Ambulatory Visit: Payer: Self-pay | Admitting: Family Medicine

## 2024-01-23 LAB — COMPREHENSIVE METABOLIC PANEL WITH GFR
AG Ratio: 2.1 (calc) (ref 1.0–2.5)
ALT: 8 U/L (ref 6–29)
AST: 13 U/L (ref 10–35)
Albumin: 4.4 g/dL (ref 3.6–5.1)
Alkaline phosphatase (APISO): 87 U/L (ref 37–153)
BUN/Creatinine Ratio: 16 (calc) (ref 6–22)
BUN: 19 mg/dL (ref 7–25)
CO2: 26 mmol/L (ref 20–32)
Calcium: 10.1 mg/dL (ref 8.6–10.4)
Chloride: 103 mmol/L (ref 98–110)
Creat: 1.21 mg/dL — ABNORMAL HIGH (ref 0.60–0.95)
Globulin: 2.1 g/dL (ref 1.9–3.7)
Glucose, Bld: 98 mg/dL (ref 65–99)
Potassium: 4.6 mmol/L (ref 3.5–5.3)
Sodium: 138 mmol/L (ref 135–146)
Total Bilirubin: 0.3 mg/dL (ref 0.2–1.2)
Total Protein: 6.5 g/dL (ref 6.1–8.1)
eGFR: 45 mL/min/1.73m2 — ABNORMAL LOW (ref >=60)

## 2024-01-23 LAB — CBC WITH DIFFERENTIAL/PLATELET
Absolute Lymphocytes: 2424 {cells}/uL (ref 850–3900)
Absolute Monocytes: 701 {cells}/uL (ref 200–950)
Basophils Absolute: 37 {cells}/uL (ref 0–200)
Basophils Relative: 0.5 %
Eosinophils Absolute: 263 {cells}/uL (ref 15–500)
Eosinophils Relative: 3.6 %
HCT: 36.9 % (ref 35.0–45.0)
Hemoglobin: 11.8 g/dL (ref 11.7–15.5)
MCH: 29.5 pg (ref 27.0–33.0)
MCHC: 32 g/dL (ref 32.0–36.0)
MCV: 92.3 fL (ref 80.0–100.0)
MPV: 10 fL (ref 7.5–12.5)
Monocytes Relative: 9.6 %
Neutro Abs: 3876 {cells}/uL (ref 1500–7800)
Neutrophils Relative %: 53.1 %
Platelets: 213 Thousand/uL (ref 140–400)
RBC: 4 Million/uL (ref 3.80–5.10)
RDW: 12.7 % (ref 11.0–15.0)
Total Lymphocyte: 33.2 %
WBC: 7.3 Thousand/uL (ref 3.8–10.8)

## 2024-01-23 LAB — HEMOGLOBIN A1C
Hgb A1c MFr Bld: 6 % — ABNORMAL HIGH (ref <5.7)
Mean Plasma Glucose: 126 mg/dL
eAG (mmol/L): 7 mmol/L

## 2024-01-26 NOTE — Telephone Encounter (Signed)
 Spoke with patient- needed phone number updated.

## 2024-01-27 ENCOUNTER — Telehealth: Payer: Self-pay | Admitting: Physician Assistant

## 2024-01-27 ENCOUNTER — Encounter (HOSPITAL_COMMUNITY): Payer: Self-pay

## 2024-01-27 ENCOUNTER — Other Ambulatory Visit: Payer: Self-pay

## 2024-01-27 ENCOUNTER — Emergency Department (HOSPITAL_COMMUNITY)
Admission: EM | Admit: 2024-01-27 | Discharge: 2024-01-27 | Disposition: A | Discharge status: Home / Self Care | Attending: Emergency Medicine | Admitting: Emergency Medicine

## 2024-01-27 ENCOUNTER — Emergency Department (HOSPITAL_COMMUNITY)

## 2024-01-27 DIAGNOSIS — J4489 Other specified chronic obstructive pulmonary disease: Secondary | ICD-10-CM | POA: Diagnosis not present

## 2024-01-27 DIAGNOSIS — R079 Chest pain, unspecified: Secondary | ICD-10-CM

## 2024-01-27 DIAGNOSIS — Z7901 Long term (current) use of anticoagulants: Secondary | ICD-10-CM | POA: Diagnosis not present

## 2024-01-27 DIAGNOSIS — R0789 Other chest pain: Secondary | ICD-10-CM | POA: Insufficient documentation

## 2024-01-27 DIAGNOSIS — I517 Cardiomegaly: Secondary | ICD-10-CM | POA: Diagnosis not present

## 2024-01-27 DIAGNOSIS — R0602 Shortness of breath: Secondary | ICD-10-CM | POA: Insufficient documentation

## 2024-01-27 DIAGNOSIS — Z91040 Latex allergy status: Secondary | ICD-10-CM | POA: Diagnosis not present

## 2024-01-27 LAB — CBC WITH DIFFERENTIAL/PLATELET
Abs Immature Granulocytes: 0.02 K/uL (ref 0.00–0.07)
Basophils Absolute: 0.1 K/uL (ref 0.0–0.1)
Basophils Relative: 1 %
Eosinophils Absolute: 0.2 K/uL (ref 0.0–0.5)
Eosinophils Relative: 3 %
HCT: 38.8 % (ref 36.0–46.0)
Hemoglobin: 12.9 g/dL (ref 12.0–15.0)
Immature Granulocytes: 0 %
Lymphocytes Relative: 29 %
Lymphs Abs: 1.8 K/uL (ref 0.7–4.0)
MCH: 30 pg (ref 26.0–34.0)
MCHC: 33.2 g/dL (ref 30.0–36.0)
MCV: 90.2 fL (ref 80.0–100.0)
Monocytes Absolute: 0.6 K/uL (ref 0.1–1.0)
Monocytes Relative: 10 %
Neutro Abs: 3.6 K/uL (ref 1.7–7.7)
Neutrophils Relative %: 57 %
Platelets: 185 K/uL (ref 150–400)
RBC: 4.3 MIL/uL (ref 3.87–5.11)
RDW: 12.6 % (ref 11.5–15.5)
WBC: 6.2 K/uL (ref 4.0–10.5)
nRBC: 0 % (ref 0.0–0.2)

## 2024-01-27 LAB — COMPREHENSIVE METABOLIC PANEL WITH GFR
ALT: 11 U/L (ref 0–44)
AST: 18 U/L (ref 15–41)
Albumin: 4.1 g/dL (ref 3.5–5.0)
Alkaline Phosphatase: 87 U/L (ref 38–126)
Anion gap: 11 (ref 5–15)
BUN: 19 mg/dL (ref 8–23)
CO2: 21 mmol/L — ABNORMAL LOW (ref 22–32)
Calcium: 9.8 mg/dL (ref 8.9–10.3)
Chloride: 102 mmol/L (ref 98–111)
Creatinine, Ser: 1.29 mg/dL — ABNORMAL HIGH (ref 0.44–1.00)
GFR, Estimated: 42 mL/min — ABNORMAL LOW (ref >60)
Glucose, Bld: 178 mg/dL — ABNORMAL HIGH (ref 70–99)
Potassium: 4.2 mmol/L (ref 3.5–5.1)
Sodium: 134 mmol/L — ABNORMAL LOW (ref 135–145)
Total Bilirubin: 0.6 mg/dL (ref 0.0–1.2)
Total Protein: 6.9 g/dL (ref 6.5–8.1)

## 2024-01-27 LAB — TROPONIN I (HIGH SENSITIVITY): Troponin I (High Sensitivity): 9 ng/L (ref <18)

## 2024-01-27 NOTE — ED Triage Notes (Signed)
 Pt arrived via POV from home for evaluation of left chest pain. Pt endorses feeling SOB with a heavy chest pain X 3 days. Pt reports calling her cardiology office today and being advised to seek evaluation in the ER.

## 2024-01-27 NOTE — Telephone Encounter (Signed)
 Pt c/o Shortness Of Breath: STAT if SOB developed within the last 24 hours or pt is noticeably SOB on the phone  1. Are you currently SOB (can you hear that pt is SOB on the phone)? No   2. How long have you been experiencing SOB? 3 days   3. Are you SOB when sitting or when up moving around? Both   4. Are you currently experiencing any other symptoms? No

## 2024-01-27 NOTE — Telephone Encounter (Signed)
 Pt states she has not been feeling real good lately, for the last 3 days.  Says she is hurting in my chest, like someone is sitting on it, and it is coming and going. Denies afib Advised pt to go to ED for evaluation.  She then states she is seeing her PCP on Friday.  Patient advised, again, to go to ED for evaluation with reported symptoms for 3 days and not to wait until Friday.. Patient verbalized understanding and agreeable to plan.   Forwarding to Charlies Arthur for FYI with upcoming appt

## 2024-01-27 NOTE — ED Provider Notes (Signed)
  EMERGENCY DEPARTMENT AT [facility] Provider Note   CSN: [id number] Arrival date & time: 01/27/24  1612     Patient presents with: Chest Pain   Erika Lee is a 78 y.o. female.  She is here with a complaint of left upper chest pressure radiating into her left scapula that has been going on for 4 days.  Does not seem to get worse with any type of exertion or deep breathing.  She does feel little short of breath though.  No nausea or vomiting.  She talked to her cardiology team today who recommended she come to the emergency department.  She is on chronic anticoagulation  {Add pertinent medical, surgical, social history, OB history to YEP:[id number]} The history is provided by the patient.  Chest Pain Pain location:  L chest Pain quality: pressure   Pain radiates to:  L shoulder Pain severity:  Moderate Onset quality:  Gradual Duration:  4 days Timing:  Constant Progression:  Unchanged Chronicity:  New Relieved by:  None tried Worsened by:  Nothing Ineffective treatments:  None tried Associated symptoms: shortness of breath   Associated symptoms: no abdominal pain, no cough, no diaphoresis, no fever, no nausea, no syncope and no vomiting        Prior to Admission medications   Medication Sig Start Date End Date Taking? Authorizing Provider  amLODipine  (NORVASC ) 5 MG tablet Take 1 tablet (5 mg total) by mouth daily. Patient not taking: Reported on 01/22/2024 09/22/23   Duanne Sonny Poth DASEN, MD  apixaban  (ELIQUIS ) 5 MG TABS tablet Take 1 tablet (5 mg total) by mouth 2 (two) times daily. 10/28/23   Duanne Merrick Maggio DASEN, MD  azelastine  (ASTELIN ) 0.1 % nasal spray Place 1 spray into both nostrils 2 (two) times daily. Use in each nostril as directed- helps with postnasal drip 09/03/23   Aletha Bene, MD  cephALEXin  (KEFLEX ) 500 MG capsule Take 1 capsule (500 mg total) by mouth 3 (three) times daily. 01/05/24   Aletha Bene, MD  diazepam  (VALIUM ) 5 MG tablet TAKE 1 TABLET(5  MG) BY MOUTH AT BEDTIME AS NEEDED FOR SLEEP 10/03/23   Duanne Teigen Bellin DASEN, MD  estradiol  (ESTRACE ) 0.1 MG/GM vaginal cream Place 1 Applicatorful vaginally at bedtime. 08/08/23   Duanne Baylin Cabal DASEN, MD  glucose blood (FREESTYLE PRECISION NEO TEST) test strip DX: E11.9 Use to check blood sugar daily 10/13/23   Duanne Vanessa Kampf DASEN, MD  HYDROcodone -acetaminophen  (NORCO) 10-325 MG tablet Take 1 tablet by mouth every 6 (six) hours as needed. 01/15/24   Duanne Donis Pinder DASEN, MD  Lancets (FREESTYLE) lancets Use to check blood sugar daily. 12/31/22   Duanne Sharley Keeler DASEN, MD  lisinopril  (ZESTRIL ) 40 MG tablet Take 1 tablet (40 mg total) by mouth daily. 10/28/23   Duanne Sandor Arboleda DASEN, MD  ondansetron  (ZOFRAN ) 4 MG tablet Take 1 tablet (4 mg total) by mouth every 8 (eight) hours as needed for nausea or vomiting. 01/05/24   Aletha Bene, MD  oxybutynin  (DITROPAN ) 5 MG tablet 1 tab po q 8 hrs prn frequent feeling of need to urinate 10/22/23   Matilda Senior, MD  pantoprazole  (PROTONIX ) 40 MG tablet Take 1 tablet (40 mg total) by mouth daily. 01/22/24   Duanne Williamson Cavanah DASEN, MD  promethazine  (PHENERGAN ) 12.5 MG tablet Take 1 tablet (12.5 mg total) by mouth every 6 (six) hours as needed for nausea or vomiting. 10/28/23   Duanne Jalecia Leon DASEN, MD  simvastatin  (ZOCOR ) 40 MG tablet Take 1 tablet (40 mg total) by  mouth daily at 6 PM. 06/12/23   Duanne Bastien Strawser DASEN, MD  spironolactone  (ALDACTONE ) 25 MG tablet Take 0.5 tablets (12.5 mg total) by mouth daily. Patient not taking: Reported on 01/22/2024 10/28/23   Duanne Shayla Heming DASEN, MD  tamsulosin (FLOMAX) 0.4 MG CAPS capsule Take by mouth. 11/18/23   [provider]    Allergies: Clindamycin /lincomycin; Doxycycline; Keflex  [cephalexin ]; Sulfonic acid (3,5-dibromo-4-h-ox-benz); Adhesive [tape]; Latex; and Sulfonamide derivatives    Review of Systems  Constitutional:  Negative for diaphoresis and fever.  Respiratory:  Positive for shortness of breath. Negative for cough.   Cardiovascular:   Positive for chest pain. Negative for syncope.  Gastrointestinal:  Negative for abdominal pain, nausea and vomiting.    Updated Vital Signs BP (!) 180/74 (BP Location: Right Arm)   Pulse (!) 56   Temp 98.8 F (37.1 C) (Oral)   Resp 20   Ht 5' 3 (1.6 m)   Wt 64 kg   LMP  (LMP Unknown)   SpO2 98%   BMI 24.99 kg/m   Physical Exam Vitals and nursing note reviewed.  Constitutional:      General: She is not in acute distress.    Appearance: Normal appearance. She is well-developed.  HENT:     Head: Normocephalic and atraumatic.  Eyes:     Conjunctiva/sclera: Conjunctivae normal.  Cardiovascular:     Rate and Rhythm: Normal rate and regular rhythm.     Heart sounds: Normal heart sounds. No murmur heard. Pulmonary:     Effort: Pulmonary effort is normal. No respiratory distress.     Breath sounds: Normal breath sounds. No stridor. No wheezing.  Abdominal:     Palpations: Abdomen is soft.     Tenderness: There is no abdominal tenderness. There is no guarding or rebound.  Musculoskeletal:        General: No tenderness or deformity.     Cervical back: Neck supple.     Comments: Wound to left lower leg without surrounding erythema  Skin:    General: Skin is warm and dry.  Neurological:     General: No focal deficit present.     Mental Status: She is alert.     GCS: GCS eye subscore is 4. GCS verbal subscore is 5. GCS motor subscore is 6.     Gait: Gait normal.     (all labs ordered are listed, but only abnormal results are displayed) Labs Reviewed  COMPREHENSIVE METABOLIC PANEL WITH GFR - Abnormal; Notable for the following components:      Result Value   Sodium 134 (*)    CO2 21 (*)    Glucose, Bld 178 (*)    Creatinine, Ser 1.29 (*)    GFR, Estimated 42 (*)    All other components within normal limits  CBC WITH DIFFERENTIAL/PLATELET  TROPONIN I (HIGH SENSITIVITY)  TROPONIN I (HIGH SENSITIVITY)    EKG: EKG Interpretation Date/Time:  Tuesday January 27 2024  16:20:50 EDT Ventricular Rate:  58 PR Interval:  176 QRS Duration:  142 QT Interval:  446 QTC Calculation: 437 R Axis:   101  Text Interpretation: Sinus bradycardia Right bundle branch block Abnormal ECG When compared with ECG of 21-Erika-2025 21:54, No significant change since last tracing Confirmed by Towana Sharper [phone]) on 01/27/2024 4:25:55 PM  Radiology: DG Chest 2 View Result Date: 01/27/2024 CLINICAL DATA:  Left chest pain. Shortness of breath. Chest heaviness. EXAM: CHEST - 2 VIEW COMPARISON:  07/23/2021 FINDINGS: Mild cardiac enlargement. No vascular congestion or consolidation.  Central interstitial changes with evidence of bronchiectasis likely indicating chronic bronchitis. Hyperinflation Erika indicate emphysema. No pleural effusion or pneumothorax. Mediastinal contours appear intact. Calcification of the aorta. Degenerative changes in the spine and shoulders. IMPRESSION: Emphysematous and chronic bronchitic changes in the lungs similar to prior study. No focal consolidation. Electronically Signed   By: Elsie Gravely M.D.   On: 01/27/2024 16:37    {Document cardiac monitor, telemetry assessment procedure when appropriate:32947} Procedures   Medications Ordered in the ED - No data to display    {Click here for ABCD2, HEART and other calculators REFRESH Note before signing:1}                              Medical Decision Making Amount and/or Complexity of Data Reviewed Labs: ordered. Radiology: ordered.   This patient complains of ***; this involves an extensive number of treatment Options and is a complaint that carries with it a high risk of complications and morbidity. The differential includes ***  I ordered, reviewed and interpreted labs, which included *** I ordered medication *** and reviewed PMP when indicated. I ordered imaging studies which included *** and I independently    visualized and interpreted imaging which showed *** Additional history obtained  from *** Previous records obtained and reviewed *** I consulted *** and discussed lab and imaging findings and discussed disposition.  Cardiac monitoring reviewed, *** Social determinants considered, *** Critical Interventions: ***  After the interventions stated above, I reevaluated the patient and found *** Admission and further testing considered, ***   {Document critical care time when appropriate  Document review of labs and clinical decision tools ie CHADS2VASC2, etc  Document your independent review of radiology images and any outside records  Document your discussion with family members, caretakers and with consultants  Document social determinants of health affecting pt's care  Document your decision making why or why not admission, treatments were needed:32947:::1}   Final diagnoses:  None    ED Discharge Orders     None

## 2024-01-27 NOTE — Discharge Instructions (Signed)
 You were seen in the emergency department for some pressure across your chest.  You had blood work EKG and chest x-ray that did not show an obvious explanation for your symptoms.  Please continue your regular medications and follow-up with your primary care doctor and cardiologist as scheduled.  Return to the emergency department if any worsening or concerning symptoms

## 2024-01-29 ENCOUNTER — Ambulatory Visit: Admitting: Family Medicine

## 2024-01-29 DIAGNOSIS — M170 Bilateral primary osteoarthritis of knee: Secondary | ICD-10-CM | POA: Diagnosis not present

## 2024-01-30 ENCOUNTER — Ambulatory Visit (INDEPENDENT_AMBULATORY_CARE_PROVIDER_SITE_OTHER): Admitting: Family Medicine

## 2024-01-30 VITALS — BP 150/74 | HR 51 | Temp 97.7°F | Ht 64.0 in | Wt 140.4 lb

## 2024-01-30 DIAGNOSIS — M79644 Pain in right finger(s): Secondary | ICD-10-CM

## 2024-01-30 DIAGNOSIS — I10 Essential (primary) hypertension: Secondary | ICD-10-CM

## 2024-01-30 NOTE — Progress Notes (Signed)
 Subjective:    Patient ID: Erika Lee, female    DOB: 12-16-1945, 78 y.o.   MRN: [id number] 01/22/24 Patient is here today for follow-up of her diabetes, congestive heart failure with preserved ejection fraction, and coronary artery disease.  She denies any chest pain or shortness of breath or dyspnea on exertion.  She does have +1 edema in both ankles.  She recently had discontinue amlodipine  due to hypotension.  She also stopped spironolactone  but she continued to take lisinopril .  Her blood pressure since stopping amlodipine  and spironolactone  has been between 120 and 162 systolic over 55-81.  She denies any dizziness or syncope or near syncope.  At that time, my plan was: Patient is still seeing urology for hematuria and right-sided hydronephrosis.  Her blood pressure is slightly elevated.  I have asked her to continue lisinopril  and resume spironolactone .  Discontinue amlodipine .  I chose the spironolactone  due to her history of congestive heart failure with preserved ejection fraction.  Patient's heart rate is slightly bradycardic.  Her average heart rate is 52-69 however she is asymptomatic.  She is currently in normal sinus rhythm.  She takes Eliquis  for prevention of stroke.  I will check a hemoglobin A1c today along with a CBC and a CMP.  Ideally I like to see her A1c less than 6.5.  Monitor her renal function along with her hemoglobin.  Patient is medically cleared to discontinue Eliquis  3 days prior to her planned cystoscopy and urologic procedure and then resume the medication afterwards  01/30/24 Patient's blood pressure shot up after she stopped amlodipine .  She saw systolic blood pressure on the 27th of 180.  The patient then decided to resume amlodipine  5 mg a day independently.  Since she did that, her blood pressure has been steadily trending down and is now down to 144/60.  She has been back on the amlodipine  now for about 4 days Past Medical History:  Diagnosis Date    Anticoagulant long-term use    eliquis    Chronic kidney disease    Chronic sinusitis    Dysrhythmia    A.fib,   GERD (gastroesophageal reflux disease)    Heart failure with preserved ejection fraction (HCC)    Hemorrhoids    History of colitis 10/2016   campylobacter   Hyperlipidemia    Hypertension    Multinodular goiter    per pt has had 2 biopsy's both benign   NSTEMI (non-ST elevated myocardial infarction) (HCC)    12/2018- normal coronary arteries on cath, poss coronary vasospasm.    Osteoarthritis    all over  and [facility] joint left shoulder   Paroxysmal atrial fibrillation [facility]) EP cardiologist--  dr kelsie   s/p PVI by Dr kelsie 07/16/2013;   pt first dx 2008,  recurrence 2014 afib/flutter and tachy-brady   Rotator cuff tear, left    Shoulder impingement, left    Status post placement of implantable loop recorder    original placement 07-26-2014;  removal / replacement 10-17-2017  by dr allred   Type 2 diabetes mellitus (HCC)    followed by pcp   Past Surgical History:  Procedure Laterality Date   ATRIAL FIBRILLATION ABLATION N/A 07/16/2013   PVI and CTI ablation by Dr kelsie   CARDIAC CATHETERIZATION     01/11/2019- Normal coronary arteries.    COLONOSCOPY  09/10/2011   Dr. Mavis: normal, 10 year follow up.   CYSTOSCOPY W/ URETERAL STENT PLACEMENT Right 10/22/2023   Procedure: CYSTOSCOPY, WITH  RETROGRADE PYELOGRAM, RIGHT URETEROSCOPY, PERFORMANCE OF RIGHT RENAL WASHINGS AND URETERAL STENT INSERTION;  Surgeon: Matilda Senior, MD;  Location: [facility];  Service: Urology;  Laterality: Right;   implantable loop recorder removal  04/25/2021   MDT LINQ removed by Dr Kelsie   LEFT HEART CATH AND CORONARY ANGIOGRAPHY N/A 01/11/2019   Procedure: LEFT HEART CATH AND CORONARY ANGIOGRAPHY;  Surgeon: Court Dorn PARAS, MD;  Location: [facility];  Service: Cardiovascular;  Laterality: N/A;   LESION REMOVAL N/A 05/03/2013   Procedure: EXCISION CYST, BACK;  Surgeon: Oneil DELENA Budge, MD;  Location: [facility];  Service: General;  Laterality: N/A;   LOOP RECORDER IMPLANT N/A 07/26/2014   Procedure: LOOP RECORDER IMPLANT;  Surgeon: Lynwood Kelsie, MD;  Location: [facility] CATH LAB;  Service: Cardiovascular;  Laterality: N/A;   LOOP RECORDER INSERTION N/A 10/17/2017   MDT previously implanted ILR for afib management at [facility].  old device removed and new device placed by Dr Kelsie   LOOP RECORDER REMOVAL N/A 10/17/2017   MDT ILR removed with new device subsequently replaced   SHOULDER ARTHROSCOPY WITH ROTATOR CUFF REPAIR AND SUBACROMIAL DECOMPRESSION Left 01/07/2019   Procedure: Left shoulder subacromial decompression, distal clavicle resection, extensive debridement,;  Surgeon: Sharl Selinda Dover, MD;  Location: [facility];  Service: Orthopedics;  Laterality: Left;   TEE WITHOUT CARDIOVERSION N/A 07/15/2013   Procedure: TRANSESOPHAGEAL ECHOCARDIOGRAM (TEE);  Surgeon: Redell GORMAN Shallow, MD;  Location: [facility] ENDOSCOPY;  Service: Cardiovascular;  Laterality: N/A;   TONSILLECTOMY  age 76   TRANSANAL HEMORRHOIDAL DEARTERIALIZATION N/A 09/22/2015   Procedure: TRANSANAL HEMORRHOIDAL LIGATION/PEXY EUA POSSIBLE HEMORRHOIDECTOMY ;  Surgeon: Elspeth Schultze, MD;  Location: [facility];  Service: General;  Laterality: N/A;   Current Outpatient Medications on File Prior to Visit  Medication Sig Dispense Refill   amLODipine  (NORVASC ) 5 MG tablet Take 1 tablet (5 mg total) by mouth daily. (Patient not taking: Reported on 01/22/2024) 90 tablet 3   apixaban  (ELIQUIS ) 5 MG TABS tablet Take 1 tablet (5 mg total) by mouth 2 (two) times daily. 180 tablet 3   azelastine  (ASTELIN ) 0.1 % nasal spray Place 1 spray into both nostrils 2 (two) times daily. Use in each nostril as directed- helps with postnasal drip 30 mL 1   cephALEXin  (KEFLEX ) 500 MG capsule Take 1 capsule (500 mg total) by mouth 3 (three) times daily. 21 capsule 0   diazepam  (VALIUM ) 5 MG tablet TAKE 1 TABLET(5 MG) BY MOUTH AT BEDTIME AS  NEEDED FOR SLEEP 30 tablet 3   estradiol  (ESTRACE ) 0.1 MG/GM vaginal cream Place 1 Applicatorful vaginally at bedtime. 42.5 g 12   glucose blood (FREESTYLE PRECISION NEO TEST) test strip DX: E11.9 Use to check blood sugar daily 100 strip 5   HYDROcodone -acetaminophen  (NORCO) 10-325 MG tablet Take 1 tablet by mouth every 6 (six) hours as needed. 120 tablet 0   Lancets (FREESTYLE) lancets Use to check blood sugar daily. 100 each 5   lisinopril  (ZESTRIL ) 40 MG tablet Take 1 tablet (40 mg total) by mouth daily. 90 tablet 3   ondansetron  (ZOFRAN ) 4 MG tablet Take 1 tablet (4 mg total) by mouth every 8 (eight) hours as needed for nausea or vomiting. 30 tablet 0   oxybutynin  (DITROPAN ) 5 MG tablet 1 tab po q 8 hrs prn frequent feeling of need to urinate 30 tablet 1   pantoprazole  (PROTONIX ) 40 MG tablet Take 1 tablet (40 mg total) by mouth daily. 90 tablet 3   promethazine  (PHENERGAN )  12.5 MG tablet Take 1 tablet (12.5 mg total) by mouth every 6 (six) hours as needed for nausea or vomiting. 30 tablet 3   simvastatin  (ZOCOR ) 40 MG tablet Take 1 tablet (40 mg total) by mouth daily at 6 PM. 90 tablet 2   spironolactone  (ALDACTONE ) 25 MG tablet Take 0.5 tablets (12.5 mg total) by mouth daily. (Patient not taking: Reported on 01/22/2024) 90 tablet 3   tamsulosin (FLOMAX) 0.4 MG CAPS capsule Take by mouth.     No current facility-administered medications on file prior to visit.     Allergies  Allergen Reactions   Clindamycin /Lincomycin Rash   Doxycycline Other (See Comments)    Makes heart race   Keflex  [Cephalexin ] Nausea And Vomiting   Sulfonic Acid (3,5-Dibromo-4-H-Ox-Benz) Other (See Comments)    Unknown    Adhesive [Tape] Rash   Latex Rash   Sulfonamide Derivatives Nausea And Vomiting   Social History   Socioeconomic History   Marital status: Married    Spouse name: Interior and spatial designer   Number of children: 2   Years of education: 12   Highest education level: 12th grade  Occupational History    Occupation: Advertising copywriter: RETIRED    Comment: Full time  Tobacco Use   Smoking status: Former    Current packs/day: 0.00    Types: Cigarettes    Start date: 01/03/1960    Quit date: 01/02/1994    Years since quitting: 30.0    Passive exposure: Past   Smokeless tobacco: Never  Vaping Use   Vaping status: Never Used  Substance and Sexual Activity   Alcohol use: No   Drug use: No   Sexual activity: Not Currently    Partners: Male  Other Topics Concern   Not on file  Social History Narrative   One child deceased from homicide at age 43    Social Drivers of Health   Financial Resource Strain: Low Risk  (05/21/2022)   Overall Financial Resource Strain (CARDIA)    Difficulty of Paying Living Expenses: Not hard at all  Food Insecurity: No Food Insecurity (10/23/2023)   Hunger Vital Sign    Worried About Running Out of Food in the Last Year: Never true    Ran Out of Food in the Last Year: Never true  Transportation Needs: No Transportation Needs (10/23/2023)   PRAPARE - Administrator, Civil Service (Medical): No    Lack of Transportation (Non-Medical): No  Physical Activity: Inactive (05/21/2022)   Exercise Vital Sign    Days of Exercise per Week: 0 days    Minutes of Exercise per Session: 0 min  Stress: Stress Concern Present (05/21/2022)   [employer] of Occupational Health - Occupational Stress Questionnaire    Feeling of Stress : To some extent  Social Connections: Moderately Isolated (10/23/2023)   Social Connection and Isolation Panel    Frequency of Communication with Friends and Family: More than three times a week    Frequency of Social Gatherings with Friends and Family: Once a week    Attends Religious Services: 1 to 4 times per year    Active Member of [employer] or Organizations: No    Attends Banker Meetings: Never    Marital Status: Widowed  Intimate Partner Violence: Not At Risk (10/23/2023)   Humiliation, Afraid,  Rape, and Kick questionnaire    Fear of Current or Ex-Partner: No    Emotionally Abused: No    Physically Abused: No  Sexually Abused: No     Review of Systems  All other systems reviewed and are negative.      Objective:   Physical Exam Vitals reviewed.  Constitutional:      General: She is not in acute distress.    Appearance: Normal appearance. She is not ill-appearing or toxic-appearing.  Cardiovascular:     Rate and Rhythm: Normal rate and regular rhythm.     Pulses: Normal pulses.     Heart sounds: Normal heart sounds. No murmur heard.    No friction rub. No gallop.  Pulmonary:     Effort: Pulmonary effort is normal. No respiratory distress.     Breath sounds: No stridor. No wheezing, rhonchi or rales.  Abdominal:     General: Abdomen is flat. Bowel sounds are normal. There is no distension.     Palpations: Abdomen is soft.     Tenderness: There is no abdominal tenderness. There is no guarding.  Musculoskeletal:     Cervical back: Neck supple.  Neurological:     General: No focal deficit present.     Mental Status: She is alert and oriented to person, place, and time.     Cranial Nerves: No cranial nerve deficit.     Sensory: No sensory deficit.     Motor: No weakness.     Coordination: Coordination normal.     Gait: Gait normal.     Deep Tendon Reflexes: Reflexes normal.  Psychiatric:        Mood and Affect: Mood normal.        Thought Content: Thought content normal.           Assessment & Plan:  Pain of right thumb - Plan: DG Hand Complete Right  Essential hypertension, benign Blood pressures are improving.  Continue 5 mg a day of amlodipine .  If her blood pressure drops too low we can reduce amlodipine  to 2.5 mg.  The patient does complain of some pain in her right first MCP joint.  I believe that this is likely osteoarthritis.  I recommended trying Voltaren  gel topically 3-4 times a day because she is on anticoagulation.  If not improving, she can  go for an x-ray of the thumb to determine the extent of the osteoarthritis

## 2024-02-05 NOTE — Progress Notes (Unsigned)
 Cardiology Office Note Date:  02/05/2024  Patient ID:  Erika Lee, Erika Lee 02/22/1946, MRN [id number] PCP:  Duanne Butler DASEN, MD  Electrophysiologist:  Dr. Kelsie >> Dr. Cindie   Chief Complaint:    *** post ER  History of Present Illness: Erika Lee is a 78 y.o. female with history of  DM, GERD, HTN,  AFlutter/AFib (s/p CTI and PVI ablation 2015),  July 2020 suffered NSTEMI > echo noted preserved LVEF with new WMA > cath with normal coronaries, possibly had spasm.    LVEF 65-70%, no WMA, RV OK, , no sign VHD  She saw Dr. Cindie 08/07/22, fluctuation in HR/rhythm, unable to tell if/when she is in AFib, compliant with Eliquis .  Mentioned on Jardiance , off metoprolol .  No changes were made.  Saw Dr. Duanne 05/05/23, caring for her husband was weighing on her, tired. + edema, no SOB/nocturnal symptoms Planned for labs HR was 46 BP 130/72   I saw her 06/02/23 She continues to be the caregiver for her husband now over a year post stroke, requiring total care. She denies CP, palpitations or cardiac awareness No dizzy spells, no near syncope or syncope. She does get SOB with heavier activities, gives an example of bring the trash cans to the road, or carrying groceries , not with usual ADLs or care for her husband. No bleeding or signs of bleeding Her observation of HRs at home 40's-50's when she checks EKG was SB 48, RBBB Ambulated in the office HR >> 68-73bpm, O2 sats 95-97%, she did report feeling a bit SOB, did not have to stop Planned for a monitor did not suspect she needed a pacer in the short term at least On no nodal blocking agents  Monitor looked OK.  HRs were OK with average rate 56bpm  No AFib  I saw her 08/04/23 She reports that in d/w Hospice team, suspect her husband had started the dying process Not eating much, she continues to be the primary caregiver for him She hopes the process will be painless and peacful for him, very hard to watch. No CP,  palpitations Her back bothers her, hoping to get an injection again soon. No near syncope or syncope She is tired The same DOE, not escalating, the same examples of bring the trash cans to the road, or carrying groceries get her winded.  No SOB DOE otherwise Not felt to be overtly volume OL No changes were made Planned for 4 mo f/u  ER visit 10/01/23: feeling weak/jittery, recent passing of her husband, not eating as usual, outpt w/u for weight loss in progress, pending EGD/colonoscopy, mammogram and cystoscopy for some hematuria  UA suspect for UTI, labs evaluation felt to be reassuring, Rx cipro , with plans discharged from the ER w/close PMD followup though she signed out AMA prior to formal d/c  10/22/23: cystoscopy right retrograde ureteropyelogram, fluoroscopic interpretation, right ureteroscopy, performance of right renal washings, placement of 6 French by 24 cm contour double-J stent without tether   Admitted 10/22/23 : post  procedure N/V 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 No dizzy spells, near syncope or syncope She is eating better, drinking more water  She spend much of today's visit reminiscing about her husband. Misses him very much No changes made  ER 01/27/24 CP, SOB K+ 4.2 BUN/Creat 19/1.29 HS Trop 9 WBC 6.2 H/H 12/38 Plts 185 EKG: SB 58, RBBB Not felt to require  admission  TODAY  *** symptoms *** brady?   AFib/AAD hx Diagnosed 2008 Flecainide  failed to maintain SR (2015) PVI ablation 07/16/2013  Device information: MDT ILR implanted 10/17/17, AFib surveillance  RRT Removed 04/25/21   Past Medical History:  Diagnosis Date   Anticoagulant long-term use    eliquis    Chronic kidney disease    Chronic sinusitis    Dysrhythmia    A.fib,   GERD (gastroesophageal reflux disease)    Heart failure with preserved ejection fraction (HCC)    Hemorrhoids    History of colitis 10/2016   campylobacter   Hyperlipidemia    Hypertension    Multinodular goiter    per pt has had 2 biopsy's both benign   NSTEMI (non-ST elevated myocardial infarction) (HCC)    12/2018- normal coronary arteries on cath, poss coronary vasospasm.    Osteoarthritis    all over  and [facility] joint left shoulder   Paroxysmal atrial fibrillation [facility]) EP cardiologist--  dr kelsie   s/p PVI by Dr kelsie 07/16/2013;   pt first dx 2008,  recurrence 2014 afib/flutter and tachy-brady   Rotator cuff tear, left    Shoulder impingement, left    Status post placement of implantable loop recorder    original placement 07-26-2014;  removal / replacement 10-17-2017  by dr allred   Type 2 diabetes mellitus (HCC)    followed by pcp    Past Surgical History:  Procedure Laterality Date   ATRIAL FIBRILLATION ABLATION N/A 07/16/2013   PVI and CTI ablation by Dr kelsie   CARDIAC CATHETERIZATION     01/11/2019- Normal coronary arteries.    COLONOSCOPY  09/10/2011   Dr. Mavis: normal, 10 year follow up.   CYSTOSCOPY W/ URETERAL STENT PLACEMENT Right 10/22/2023   Procedure: CYSTOSCOPY, WITH RETROGRADE PYELOGRAM, RIGHT URETEROSCOPY, PERFORMANCE OF RIGHT RENAL WASHINGS AND URETERAL STENT INSERTION;  Surgeon: Matilda Senior, MD;  Location: [facility] ORS;  Service: Urology;  Laterality: Right;   implantable loop recorder removal  04/25/2021   MDT LINQ removed by Dr kelsie   LEFT HEART CATH AND  CORONARY ANGIOGRAPHY N/A 01/11/2019   Procedure: LEFT HEART CATH AND CORONARY ANGIOGRAPHY;  Surgeon: Court Dorn PARAS, MD;  Location: [facility] INVASIVE CV LAB;  Service: Cardiovascular;  Laterality: N/A;   LESION REMOVAL N/A 05/03/2013   Procedure: EXCISION CYST, BACK;  Surgeon: Oneil DELENA Mavis, MD;  Location: [facility];  Service: General;  Laterality: N/A;   LOOP RECORDER IMPLANT N/A 07/26/2014   Procedure: LOOP RECORDER IMPLANT;  Surgeon: Lynwood kelsie, MD;  Location: [facility] CATH LAB;  Service: Cardiovascular;  Laterality: N/A;   LOOP RECORDER INSERTION N/A 10/17/2017   MDT previously implanted ILR for afib management at RRT.  old device removed and new device placed by Dr kelsie   LOOP RECORDER REMOVAL N/A 10/17/2017   MDT ILR removed with new device subsequently replaced   SHOULDER ARTHROSCOPY WITH ROTATOR CUFF REPAIR AND SUBACROMIAL DECOMPRESSION Left 01/07/2019   Procedure: Left shoulder subacromial  decompression, distal clavicle resection, extensive debridement,;  Surgeon: Sharl Selinda Dover, MD;  Location: [facility];  Service: Orthopedics;  Laterality: Left;   TEE WITHOUT CARDIOVERSION N/A 07/15/2013   Procedure: TRANSESOPHAGEAL ECHOCARDIOGRAM (TEE);  Surgeon: Redell GORMAN Shallow, MD;  Location: [facility] ENDOSCOPY;  Service: Cardiovascular;  Laterality: N/A;   TONSILLECTOMY  age 89   TRANSANAL HEMORRHOIDAL DEARTERIALIZATION N/A 09/22/2015   Procedure: TRANSANAL HEMORRHOIDAL LIGATION/PEXY EUA POSSIBLE HEMORRHOIDECTOMY ;  Surgeon: Elspeth Schultze, MD;  Location: [facility];  Service: General;  Laterality: N/A;    Current Outpatient Medications  Medication Sig Dispense Refill   amLODipine  (NORVASC ) 5 MG tablet Take 1 tablet (5 mg total) by mouth daily. 90 tablet 3   apixaban  (ELIQUIS ) 5 MG TABS tablet Take 1 tablet (5 mg total) by mouth 2 (two) times daily. 180 tablet 3   azelastine  (ASTELIN ) 0.1 % nasal spray Place 1 spray into both nostrils 2 (two) times daily. Use in each nostril as directed-  helps with postnasal drip 30 mL 1   cephALEXin  (KEFLEX ) 500 MG capsule Take 1 capsule (500 mg total) by mouth 3 (three) times daily. 21 capsule 0   diazepam  (VALIUM ) 5 MG tablet TAKE 1 TABLET(5 MG) BY MOUTH AT BEDTIME AS NEEDED FOR SLEEP 30 tablet 3   estradiol  (ESTRACE ) 0.1 MG/GM vaginal cream Place 1 Applicatorful vaginally at bedtime. 42.5 g 12   glucose blood (FREESTYLE PRECISION NEO TEST) test strip DX: E11.9 Use to check blood sugar daily 100 strip 5   HYDROcodone -acetaminophen  (NORCO) 10-325 MG tablet Take 1 tablet by mouth every 6 (six) hours as needed. 120 tablet 0   Lancets (FREESTYLE) lancets Use to check blood sugar daily. 100 each 5   lisinopril  (ZESTRIL ) 40 MG tablet Take 1 tablet (40 mg total) by mouth daily. 90 tablet 3   ondansetron  (ZOFRAN ) 4 MG tablet Take 1 tablet (4 mg total) by mouth every 8 (eight) hours as needed for nausea or vomiting. 30 tablet 0   oxybutynin  (DITROPAN ) 5 MG tablet 1 tab po q 8 hrs prn frequent feeling of need to urinate (Patient not taking: Reported on 01/30/2024) 30 tablet 1   pantoprazole  (PROTONIX ) 40 MG tablet Take 1 tablet (40 mg total) by mouth daily. 90 tablet 3   promethazine  (PHENERGAN ) 12.5 MG tablet Take 1 tablet (12.5 mg total) by mouth every 6 (six) hours as needed for nausea or vomiting. 30 tablet 3   simvastatin  (ZOCOR ) 40 MG tablet Take 1 tablet (40 mg total) by mouth daily at 6 PM. 90 tablet 2   spironolactone  (ALDACTONE ) 25 MG tablet Take 0.5 tablets (12.5 mg total) by mouth daily. 90 tablet 3   tamsulosin (FLOMAX) 0.4 MG CAPS capsule Take by mouth.     No current facility-administered medications for this visit.    Allergies:   Clindamycin /lincomycin; Doxycycline; Keflex  [cephalexin ]; Sulfonic acid (3,5-dibromo-4-h-ox-benz); Adhesive [tape]; Latex; and Sulfonamide derivatives   Social History:  The patient  reports that she quit smoking about 30 years ago. Her smoking use included cigarettes. She started smoking about 64 years ago. She  has been exposed to tobacco smoke. She has never used smokeless tobacco. She reports that she does not drink alcohol and does not use drugs.   Family History:  The patient's family history includes Kidney disease in her maternal grandfather; Other in her maternal grandfather; Stroke in her maternal grandmother; Stroke (age of onset: 74) in her mother.  ROS:  Please see the history of present illness.  All other systems are reviewed and otherwise negative.   PHYSICAL EXAM:  VS:  LMP  (LMP Unknown)  BMI: There is no height or weight on file to calculate BMI. Well nourished, well developed, in no acute distress  HEENT: normocephalic, atraumatic  Neck: no JVD, carotid bruits or masses Cardiac: *** RRR; no significant murmurs, no rubs, or gallops Lungs: *** CTA b/l, no wheezing, rhonchi or rales  Abd: soft, nontender MS: no deformity or atrophy Ext: *** numerous spider veins, she has some ankle edema, not particularly pitting Skin: warm and dry, no rash Neuro:  No gross deficits appreciated Psych: euthymic mood, full affect   EKG:  done today and reviewed by myself ***  Dec 2024: Monitor HR 40 - 169 bpm, average 56 bpm. 68 SVT, longest 37.9 seconds with an average rate of 116 bpm Occasional supraventricular ectopy, 1.6% Rare ventricular ectopy. No atrial fibrillation.  05/09/22: TTE 1. Left ventricular ejection fraction, by estimation, is 65 to 70%. Left  ventricular ejection fraction by 3D volume is 70 %. The left ventricle has  normal function. The left ventricle has no regional wall motion  abnormalities. Left ventricular diastolic   parameters are consistent with Grade I diastolic dysfunction (impaired  relaxation).   2. Right ventricular systolic function is normal. The right ventricular  size is normal. There is normal pulmonary artery systolic pressure. The  estimated right ventricular systolic pressure is 31.5 mmHg.   3. Left atrial size was moderately dilated.   4.  Right atrial size was moderately dilated.   5. The mitral valve is normal in structure. Trivial mitral valve  regurgitation. No evidence of mitral stenosis.   6. The aortic valve is tricuspid. Aortic valve regurgitation is trivial.  No aortic stenosis is present.   7. The inferior vena cava is normal in size with greater than 50%  respiratory variability, suggesting right atrial pressure of 3 mmHg.   Comparison(s): No significant change from prior study. Prior images  reviewed side by side.   08/02/21: TTE  1. Left ventricular ejection fraction, by estimation, is 60 to 65%. The  left ventricle has normal function. The left ventricle has no regional  wall motion abnormalities. There is mild asymmetric left ventricular  hypertrophy of the basal-septal segment.  Left ventricular diastolic parameters are consistent with Grade I  diastolic dysfunction (impaired relaxation). The average left ventricular  global longitudinal strain is -21.8 %. The global longitudinal strain is  normal.   2. Right ventricular systolic function is normal. The right ventricular  size is normal. There is normal pulmonary artery systolic pressure.   3. Left atrial size was severely dilated.   4. The mitral valve is normal in structure. Trivial mitral valve  regurgitation. No evidence of mitral stenosis.   5. The aortic valve is tricuspid. Aortic valve regurgitation is trivial.  No aortic stenosis is present.   6. The inferior vena cava is normal in size with greater than 50%  respiratory variability, suggesting right atrial pressure of 3 mmHg.    01/11/2019: [facility] IMPRESSION: Ms. Ewton has normal coronary arteries and normal LV function.  The etiology of her chest pain, elevated troponins, EKG changes and wall motion and imagine 2D echo are still unclear.  She has normal LV function by ventriculography.  She may have had coronary spasm.  The sheath was removed and a TR band was placed on the right wrist to achieve  patent hemostasis.  The patient left lab in stable condition.  She can be discharged home later today on her home dose of Eliquis  oral anticoagulation.   Recent Labs: 10/01/2023: TSH 1.372 01/27/2024: ALT 11; BUN 19; Creatinine, Ser 1.29; Hemoglobin 12.9; Platelets 185; Potassium 4.2; Sodium 134  09/22/2023: Cholesterol 143; HDL 46; LDL Cholesterol (Calc) 75; Total CHOL/HDL Ratio 3.1; Triglycerides 136   Estimated Creatinine Clearance: 30 mL/min (A) (by C-G formula based on SCr of 1.29 mg/dL (H)).   Wt Readings from Last 3 Encounters:  01/30/24 140 lb 6.4 oz (63.7 kg)  01/27/24 141 lb 1.5 oz (64 kg)  01/22/24 141 lb 3.2 oz (64 kg)     Other studies reviewed: Additional studies/records reviewed today include: summarized above  ASSESSMENT AND PLAN:  1. Persistent AFib, Aflutter     S/p CTI and PVI ablation 2015     CHA2DS2Vasc is 4 , on Eliquis , *** appropriately dosed  2. CHF ?HFpEF *** Advised continue support stocking and elevation when seated  3. HTN     *** looks ok  4. Bradycardia 5. RBBB *** No symptoms of bradycardia   6. Secondary hypercoagulable state      Disposition: back in *** , sooner if needed    Current medicines are reviewed at length with the patient today.  The patient did not have any concerns regarding medicines.  Bonney Charlies Arthur, PA-C 02/05/2024 1:06 PM     [facility] [address] [place] [address] [phone] (office)  [phone] (fax)

## 2024-02-06 ENCOUNTER — Ambulatory Visit: Attending: Physician Assistant | Admitting: Physician Assistant

## 2024-02-06 VITALS — BP 150/62 | HR 60 | Ht 64.0 in | Wt 138.0 lb

## 2024-02-06 DIAGNOSIS — I4510 Unspecified right bundle-branch block: Secondary | ICD-10-CM

## 2024-02-06 DIAGNOSIS — R079 Chest pain, unspecified: Secondary | ICD-10-CM | POA: Diagnosis not present

## 2024-02-06 DIAGNOSIS — D6869 Other thrombophilia: Secondary | ICD-10-CM | POA: Diagnosis not present

## 2024-02-06 DIAGNOSIS — I4819 Other persistent atrial fibrillation: Secondary | ICD-10-CM | POA: Diagnosis not present

## 2024-02-06 DIAGNOSIS — R001 Bradycardia, unspecified: Secondary | ICD-10-CM

## 2024-02-06 DIAGNOSIS — I10 Essential (primary) hypertension: Secondary | ICD-10-CM | POA: Diagnosis not present

## 2024-02-06 DIAGNOSIS — R0602 Shortness of breath: Secondary | ICD-10-CM

## 2024-02-06 NOTE — Patient Instructions (Addendum)
 Medication Instructions:   Your physician recommends that you continue on your current medications as directed. Please refer to the Current Medication list given to you today.   *If you need a refill on your cardiac medications before your next appointment, please call your pharmacy*   Lab Work:  NONE ORDERED  TODAY   If you have labs (blood work) drawn today and your tests are completely normal, you will receive your results only by: MyChart Message (if you have MyChart) OR A paper copy in the mail If you have any lab test that is abnormal or we need to change your treatment, we will call you to review the results.   Testing/Procedures:  Your physician has requested that you have an echocardiogram. Echocardiography is a painless test that uses sound waves to create images of your heart. It provides your doctor with information about the size and shape of your heart and how well your heart's chambers and valves are working. This procedure takes approximately one hour. There are no restrictions for this procedure. Please do NOT wear cologne, perfume, aftershave, or lotions (deodorant is allowed). Please arrive 15 minutes prior to your appointment time.  Please note: We ask at that you not bring children with you during ultrasound (echo/ vascular) testing. Due to room size and safety concerns, children are not allowed in the ultrasound rooms during exams. Our front office staff cannot provide observation of children in our lobby area while testing is being conducted. An adult accompanying a patient to their appointment will only be allowed in the ultrasound room at the discretion of the ultrasound technician under special circumstances. We apologize for any inconvenience.   SOMEONE WILL CONTACT YOU FROM THIS DEPARTMENT WIT FURTHER INSTRUCTIONS. READ INSTRUCTIONS BELOW ALSO  Non-Cardiac CT Angiography (CTA), is a special type of CT scan that uses a computer to produce multi-dimensional views of  major blood vessels throughout the body. In CT angiography, a contrast material is injected through an IV to help visualize the blood vessels    Follow-Up: At [facility], you and your health needs are our priority.  As part of our continuing mission to provide you with exceptional heart care, our providers are all part of one team.  This team includes your primary Cardiologist (physician) and Advanced Practice Providers or APPs (Physician Assistants and Nurse Practitioners) who all work together to provide you with the care you need, when you need it.   Your next appointment:   4 -6  week(s)    Provider:    Charlies Arthur, PA-C ( CONTACT  CASSIE HALL/ ANGELINE HAMMER FOR EP SCHEDULING ISSUES )   We recommend signing up for the patient portal called MyChart.  Sign up information is provided on this After Visit Summary.  MyChart is used to connect with patients for Virtual Visits (Telemedicine).  Patients are able to view lab/test results, encounter notes, upcoming appointments, etc.  Non-urgent messages can be sent to your provider as well.   To learn more about what you can do with MyChart, go to [email or web address].   Other Instructions       Your cardiac CT will be scheduled at one of the below locations:   [facility] [address] [place], [place] [address] [phone]  OR   [facility] [address] [place], [place] [address] [phone]  OR   [facility] [address] [place], [place] [address] [phone]  OR  [facility] V[facility] Tower [address]  [place], [place] [address]  OR   [facility] [address] [place], [place]  If scheduled at [facility], please arrive at the [facility] and Children's Entrance (Entrance C2) of [facility] 30 minutes prior to test start time. You can use the FREE valet parking offered at entrance C (encouraged to control  the heart rate for the test)  Proceed to the [facility] Radiology Department (first floor) to check-in and test prep.  All radiology patients and guests should use entrance C2 at [facility], accessed from [facility], even though the hospital's physical address listed is [address].  If scheduled at the Heart and Vascular Tower at [address], please enter the parking lot using the [address] entrance and use the FREE valet service at the patient drop-off area. Enter the building and check-in with registration on the main floor.  If scheduled at [facility], please arrive to the Heart and Vascular Center 15 mins early for check-in and test prep.  There is spacious parking and easy access to the radiology department from the [facility] Heart and Vascular entrance. Please enter here and check-in with the desk attendant.   If scheduled at [facility], please arrive 30 minutes early for check-in and test prep.  Please follow these instructions carefully (unless otherwise directed):  An IV will be required for this test and Nitroglycerin  will be given.      On the Night Before the Test: Be sure to Drink plenty of water . Do not consume any caffeinated/decaffeinated beverages or chocolate 12 hours prior to your test. Do not take any antihistamines 12 hours prior to your test.    On the Day of the Test: Drink plenty of water  until 1 hour prior to the test. Do not eat any food 1 hour prior to test. You may take your regular medications prior to the test.  DO NOT TAKE ANY  Take metoprolol  (Lopressor )  If you take Furosemide /Hydrochlorothiazide /Spironolactone /Chlorthalidone, please HOLD on the morning of the test. Patients who wear a continuous glucose monitor MUST remove the device prior to scanning. FEMALES- please wear underwire-free bra if available, avoid dresses & tight clothing    After the Test: Drink plenty of  water . After receiving IV contrast, you may experience a mild flushed feeling. This is normal. On occasion, you may experience a mild rash up to 24 hours after the test. This is not dangerous. If this occurs, you can take Benadryl  25 mg, Zyrtec, Claritin , or Allegra and increase your fluid intake. (Patients taking Tikosyn should avoid Benadryl , and may take Zyrtec, Claritin , or Allegra) If you experience trouble breathing, this can be serious. If it is severe call 911 IMMEDIATELY. If it is mild, please call our office.  We will call to schedule your test 2-4 weeks out understanding that some insurance companies will need an authorization prior to the service being performed.   For more information and frequently asked questions, please visit our website : [email or web address]  For non-scheduling related questions, please contact the cardiac imaging nurse navigator should you have any questions/concerns: Cardiac Imaging Nurse Navigators Direct Office Dial: [phone]   For scheduling needs, including cancellations and rescheduling, please call [place], [phone].

## 2024-02-09 ENCOUNTER — Other Ambulatory Visit: Payer: Self-pay | Admitting: Family Medicine

## 2024-02-09 ENCOUNTER — Encounter (HOSPITAL_COMMUNITY): Payer: Self-pay

## 2024-02-12 ENCOUNTER — Ambulatory Visit (HOSPITAL_COMMUNITY)
Admission: RE | Admit: 2024-02-12 | Discharge: 2024-02-12 | Disposition: A | Discharge status: Home / Self Care | Source: Ambulatory Visit | Attending: Physician Assistant | Admitting: Physician Assistant

## 2024-02-12 DIAGNOSIS — R079 Chest pain, unspecified: Secondary | ICD-10-CM | POA: Insufficient documentation

## 2024-02-12 DIAGNOSIS — I700 Atherosclerosis of aorta: Secondary | ICD-10-CM | POA: Diagnosis not present

## 2024-02-12 DIAGNOSIS — I2510 Atherosclerotic heart disease of native coronary artery without angina pectoris: Secondary | ICD-10-CM | POA: Insufficient documentation

## 2024-02-12 MED ORDER — IOHEXOL 350 MG/ML SOLN
95.0000 mL | Freq: Once | INTRAVENOUS | Status: AC | PRN
  Administered 2024-02-12: 95 mL via INTRAVENOUS

## 2024-02-12 MED ORDER — NITROGLYCERIN 0.4 MG SL SUBL
0.8000 mg | SUBLINGUAL_TABLET | Freq: Once | SUBLINGUAL | Status: AC
  Administered 2024-02-12: 0.8 mg via SUBLINGUAL

## 2024-02-13 ENCOUNTER — Ambulatory Visit

## 2024-02-13 ENCOUNTER — Ambulatory Visit: Admitting: Family Medicine

## 2024-02-13 ENCOUNTER — Ambulatory Visit: Payer: Self-pay

## 2024-02-13 VITALS — BP 110/52 | HR 58 | Temp 98.6°F | Ht 64.0 in | Wt 135.8 lb

## 2024-02-13 DIAGNOSIS — L243 Irritant contact dermatitis due to cosmetics: Secondary | ICD-10-CM | POA: Diagnosis not present

## 2024-02-13 MED ORDER — PREDNISONE 20 MG PO TABS: ORAL_TABLET | ORAL | 0 refills | Status: DC

## 2024-02-13 MED ORDER — MOMETASONE FUROATE 0.1 % EX CREA: TOPICAL_CREAM | Freq: Every day | CUTANEOUS | 1 refills | Status: AC

## 2024-02-13 NOTE — Telephone Encounter (Signed)
  FYI Only or Action Required?: FYI only for provider.  Patient was last seen in primary care on 01/30/2024 by Duanne Butler DASEN, MD.  Called Nurse Triage reporting Pruritis.  Symptoms began yesterday.  Interventions attempted: Nothing.  Symptoms are: gradually worsening.  Triage Disposition: See PCP When Office is Open (Within 3 Days)  Patient/caregiver understands and will follow disposition?: Yes  Instructed to go to UC for treatment due to no appointment being available.  Patient argued and became upset when no appointment available.  This RN called [facility] and spoke to [place].   Copied from CRM [id number]. Topic: Clinical - Red Word Triage >> Feb 13, 2024  8:34 AM Willma R wrote: [facility] that prompted transfer to Nurse Triage: Patient put some lotion on her legs and they started itching and burning and was unable to sleep all night. Reason for Disposition  [1] MODERATE-SEVERE local itching (i.e., interferes with work, school, activities) AND [2] not improved after 24 hours of hydrocortisone  cream  Answer Assessment - Initial Assessment Questions 1. DESCRIPTION: Describe the itching you are having. Where is it located?     States put lotion on legs yesterday and has been itching nonstop, [place] for elastin and collegen 2. SEVERITY: How bad is it?      Red rash itching and can't sleep 3. SCRATCHING: Are there any scratch marks? Bleeding?     Yes, blood spots 4. ONSET: When did the itching begin? (e.g., minutes, hours, days ago)     yesterday 5. CAUSE: What do you think is causing the itching?      lotion 6. OTHER SYMPTOMS: Do you have any other symptoms? (e.g., fever, rash)     rash 7. PREGNANCY: Is there any chance you are pregnant? When was your last menstrual period?     na  Protocols used: Itching - Localized-A-AH

## 2024-02-13 NOTE — Progress Notes (Signed)
 Subjective:    Patient ID: Erika Lee, female    DOB: Jun 03, 1946, 77 y.o.   MRN: [id number] Patient recently purchased a moisturizer cream that she applied only on her legs from her ankles to her knees.  Within 1 hour, her legs started to itch extremely bad.  Within a few hours, the skin on both legs had become erythematous warm and extremely itchy.  On exam today, there is no papules or vesicles.  Instead there is diffuse erythema from the ankles to the knee.  There are petechiae and scratches from scratching.  The erythema is on both sides.  There are coalescent macules that have coalesced into large patches.  They are warm to the touch but not painful  Past Medical History:  Diagnosis Date   Anticoagulant long-term use    eliquis    Chronic kidney disease    Chronic sinusitis    Dysrhythmia    A.fib,   GERD (gastroesophageal reflux disease)    Heart failure with preserved ejection fraction (HCC)    Hemorrhoids    History of colitis 10/2016   campylobacter   Hyperlipidemia    Hypertension    Multinodular goiter    per pt has had 2 biopsy's both benign   NSTEMI (non-ST elevated myocardial infarction) (HCC)    12/2018- normal coronary arteries on cath, poss coronary vasospasm.    Osteoarthritis    all over  and [facility] joint left shoulder   Paroxysmal atrial fibrillation [facility]) EP cardiologist--  dr kelsie   s/p PVI by Dr kelsie 07/16/2013;   pt first dx 2008,  recurrence 2014 afib/flutter and tachy-brady   Rotator cuff tear, left    Shoulder impingement, left    Status post placement of implantable loop recorder    original placement 07-26-2014;  removal / replacement 10-17-2017  by dr allred   Type 2 diabetes mellitus (HCC)    followed by pcp   Past Surgical History:  Procedure Laterality Date   ATRIAL FIBRILLATION ABLATION N/A 07/16/2013   PVI and CTI ablation by Dr kelsie   CARDIAC CATHETERIZATION     01/11/2019- Normal coronary arteries.    COLONOSCOPY  09/10/2011    Dr. Mavis: normal, 10 year follow up.   CYSTOSCOPY W/ URETERAL STENT PLACEMENT Right 10/22/2023   Procedure: CYSTOSCOPY, WITH RETROGRADE PYELOGRAM, RIGHT URETEROSCOPY, PERFORMANCE OF RIGHT RENAL WASHINGS AND URETERAL STENT INSERTION;  Surgeon: Matilda Senior, MD;  Location: [facility];  Service: Urology;  Laterality: Right;   implantable loop recorder removal  04/25/2021   MDT LINQ removed by Dr kelsie   LEFT HEART CATH AND CORONARY ANGIOGRAPHY N/A 01/11/2019   Procedure: LEFT HEART CATH AND CORONARY ANGIOGRAPHY;  Surgeon: Court Dorn PARAS, MD;  [facility];  Service: Cardiovascular;  Laterality: N/A;   LESION REMOVAL N/A 05/03/2013   Procedure: EXCISION CYST, BACK;  Surgeon: Oneil DELENA Mavis, MD;  Location: [facility];  Service: General;  Laterality: N/A;   LOOP RECORDER IMPLANT N/A 07/26/2014   Procedure: LOOP RECORDER IMPLANT;  Surgeon: Lynwood kelsie, MD;  Location: [facility] CATH LAB;  Service: Cardiovascular;  Laterality: N/A;   LOOP RECORDER INSERTION N/A 10/17/2017   MDT previously implanted ILR for afib management at [facility].  old device removed and new device placed by Dr kelsie   LOOP RECORDER REMOVAL N/A 10/17/2017   MDT ILR removed with new device subsequently replaced   SHOULDER ARTHROSCOPY WITH ROTATOR CUFF REPAIR AND SUBACROMIAL DECOMPRESSION Left 01/07/2019   Procedure: Left  shoulder subacromial decompression, distal clavicle resection, extensive debridement,;  Surgeon: Sharl Selinda Dover, MD;  Location: [facility];  Service: Orthopedics;  Laterality: Left;   TEE WITHOUT CARDIOVERSION N/A 07/15/2013   Procedure: TRANSESOPHAGEAL ECHOCARDIOGRAM (TEE);  Surgeon: Redell GORMAN Shallow, MD;  Location: [facility] ENDOSCOPY;  Service: Cardiovascular;  Laterality: N/A;   TONSILLECTOMY  age 58   TRANSANAL HEMORRHOIDAL DEARTERIALIZATION N/A 09/22/2015   Procedure: TRANSANAL HEMORRHOIDAL LIGATION/PEXY EUA POSSIBLE HEMORRHOIDECTOMY ;  Surgeon: Elspeth Schultze, MD;  Location: [facility];   Service: General;  Laterality: N/A;   Current Outpatient Medications on File Prior to Visit  Medication Sig Dispense Refill   amLODipine  (NORVASC ) 5 MG tablet Take 1 tablet (5 mg total) by mouth daily. 90 tablet 3   apixaban  (ELIQUIS ) 5 MG TABS tablet Take 1 tablet (5 mg total) by mouth 2 (two) times daily. 180 tablet 3   azelastine  (ASTELIN ) 0.1 % nasal spray Place 1 spray into both nostrils 2 (two) times daily. Use in each nostril as directed- helps with postnasal drip 30 mL 1   cephALEXin  (KEFLEX ) 500 MG capsule Take 1 capsule (500 mg total) by mouth 3 (three) times daily. 21 capsule 0   diazepam  (VALIUM ) 5 MG tablet TAKE 1 TABLET(5 MG) BY MOUTH AT BEDTIME AS NEEDED FOR SLEEP 30 tablet 3   estradiol  (ESTRACE ) 0.1 MG/GM vaginal cream Place 1 Applicatorful vaginally at bedtime. 42.5 g 12   glucose blood (FREESTYLE PRECISION NEO TEST) test strip DX: E11.9 Use to check blood sugar daily 100 strip 5   HYDROcodone -acetaminophen  (NORCO) 10-325 MG tablet Take 1 tablet by mouth every 6 (six) hours as needed. 120 tablet 0   Lancets (FREESTYLE) lancets Use to check blood sugar daily. 100 each 5   lisinopril  (ZESTRIL ) 40 MG tablet Take 1 tablet (40 mg total) by mouth daily. 90 tablet 3   ondansetron  (ZOFRAN ) 4 MG tablet Take 1 tablet (4 mg total) by mouth every 8 (eight) hours as needed for nausea or vomiting. 30 tablet 0   pantoprazole  (PROTONIX ) 40 MG tablet Take 1 tablet (40 mg total) by mouth daily. 90 tablet 3   promethazine  (PHENERGAN ) 12.5 MG tablet Take 1 tablet (12.5 mg total) by mouth every 6 (six) hours as needed for nausea or vomiting. 30 tablet 3   simvastatin  (ZOCOR ) 40 MG tablet Take 1 tablet (40 mg total) by mouth daily at 6 PM. 90 tablet 2   spironolactone  (ALDACTONE ) 25 MG tablet Take 0.5 tablets (12.5 mg total) by mouth daily. 90 tablet 3   tamsulosin (FLOMAX) 0.4 MG CAPS capsule Take by mouth.     No current facility-administered medications on file prior to visit.     Allergies   Allergen Reactions   Clindamycin /Lincomycin Rash   Doxycycline Other (See Comments)    Makes heart race   Keflex  [Cephalexin ] Nausea And Vomiting   Sulfonic Acid (3,5-Dibromo-4-H-Ox-Benz) Other (See Comments)    Unknown    Adhesive [Tape] Rash   Latex Rash   Sulfonamide Derivatives Nausea And Vomiting   Social History   Socioeconomic History   Marital status: Married    Spouse name: Garen Dame   Number of children: 2   Years of education: 12   Highest education level: 12th grade  Occupational History   Occupation: Advertising copywriter: RETIRED    Comment: Full time  Tobacco Use   Smoking status: Former    Current packs/day: 0.00    Types: Cigarettes    Start  date: 01/03/1960    Quit date: 01/02/1994    Years since quitting: 30.1    Passive exposure: Past   Smokeless tobacco: Never  Vaping Use   Vaping status: Never Used  Substance and Sexual Activity   Alcohol use: No   Drug use: No   Sexual activity: Not Currently    Partners: Male  Other Topics Concern   Not on file  Social History Narrative   One child deceased from homicide at age 13    Social Drivers of Health   Financial Resource Strain: Low Risk  (05/21/2022)   Overall Financial Resource Strain (CARDIA)    Difficulty of Paying Living Expenses: Not hard at all  Food Insecurity: No Food Insecurity (10/23/2023)   Hunger Vital Sign    Worried About Running Out of Food in the Last Year: Never true    Ran Out of Food in the Last Year: Never true  Transportation Needs: No Transportation Needs (10/23/2023)   PRAPARE - Administrator, Civil Service (Medical): No    Lack of Transportation (Non-Medical): No  Physical Activity: Inactive (05/21/2022)   Exercise Vital Sign    Days of Exercise per Week: 0 days    Minutes of Exercise per Session: 0 min  Stress: Stress Concern Present (05/21/2022)   [employer] of Occupational Health - Occupational Stress Questionnaire    Feeling of Stress  : To some extent  Social Connections: Moderately Isolated (10/23/2023)   Social Connection and Isolation Panel    Frequency of Communication with Friends and Family: More than three times a week    Frequency of Social Gatherings with Friends and Family: Once a week    Attends Religious Services: 1 to 4 times per year    Active Member of [employer] or Organizations: No    Attends Banker Meetings: Never    Marital Status: Widowed  Intimate Partner Violence: Not At Risk (10/23/2023)   Humiliation, Afraid, Rape, and Kick questionnaire    Fear of Current or Ex-Partner: No    Emotionally Abused: No    Physically Abused: No    Sexually Abused: No     Review of Systems  All other systems reviewed and are negative.      Objective:   Physical Exam Vitals reviewed.  Constitutional:      General: She is not in acute distress.    Appearance: Normal appearance. She is not ill-appearing or toxic-appearing.  Cardiovascular:     Rate and Rhythm: Normal rate and regular rhythm.     Pulses: Normal pulses.     Heart sounds: Normal heart sounds. No murmur heard.    No friction rub. No gallop.  Pulmonary:     Effort: Pulmonary effort is normal. No respiratory distress.     Breath sounds: No stridor. No wheezing, rhonchi or rales.  Abdominal:     General: Abdomen is flat. Bowel sounds are normal. There is no distension.     Palpations: Abdomen is soft.     Tenderness: There is no abdominal tenderness. There is no guarding.  Musculoskeletal:     Cervical back: Neck supple.  Skin:    Findings: Erythema and rash present. Rash is macular. Rash is not crusting, nodular, papular, pustular, scaling or urticarial.      Neurological:     General: No focal deficit present.     Mental Status: She is alert and oriented to person, place, and time.     Cranial Nerves: No  cranial nerve deficit.     Sensory: No sensory deficit.     Motor: No weakness.     Coordination: Coordination normal.      Gait: Gait normal.  Psychiatric:        Mood and Affect: Mood normal.        Thought Content: Thought content normal.           Assessment & Plan:  Irritant contact dermatitis due to cosmetics Begin a prednisone  taper pack due to the severity of the reaction.  Use Elocon  cream once daily as needed for itching

## 2024-02-13 NOTE — Telephone Encounter (Signed)
 Being seen by PCP this afternoon.

## 2024-02-16 ENCOUNTER — Encounter: Payer: Self-pay | Admitting: Family Medicine

## 2024-02-16 ENCOUNTER — Ambulatory Visit (INDEPENDENT_AMBULATORY_CARE_PROVIDER_SITE_OTHER): Admitting: Family Medicine

## 2024-02-16 ENCOUNTER — Ambulatory Visit: Payer: Self-pay | Admitting: Physician Assistant

## 2024-02-16 VITALS — BP 124/62 | HR 49 | Temp 97.6°F | Ht 64.0 in | Wt 139.6 lb

## 2024-02-16 DIAGNOSIS — E118 Type 2 diabetes mellitus with unspecified complications: Secondary | ICD-10-CM | POA: Diagnosis not present

## 2024-02-16 DIAGNOSIS — L243 Irritant contact dermatitis due to cosmetics: Secondary | ICD-10-CM | POA: Diagnosis not present

## 2024-02-16 MED ORDER — FREESTYLE PRECISION NEO TEST VI STRP: ORAL_STRIP | 5 refills | Status: AC

## 2024-02-16 MED ORDER — HYDROCODONE-ACETAMINOPHEN 10-325 MG PO TABS: 1.0000 | ORAL_TABLET | Freq: Four times a day (QID) | ORAL | 0 refills | Status: DC | PRN

## 2024-02-16 NOTE — Progress Notes (Signed)
 Subjective:    Patient ID: Erika Lee, female    DOB: [birth year], 78 y.o.   MRN: [id number]  02/13/24 Patient recently purchased a moisturizer cream that she applied only on her legs from her ankles to her knees.  Within 1 hour, her legs started to itch extremely bad.  Within a few hours, the skin on both legs had become erythematous warm and extremely itchy.  On exam today, there is no papules or vesicles.  Instead there is diffuse erythema from the ankles to the knee.  There are petechiae and scratches from scratching.  The erythema is on both sides.  There are coalescent macules that have coalesced into large patches.  They are warm to the touch but not painful.  At that time, my plan was: Begin a prednisone  taper pack due to the severity of the reaction.  Use Elocon  cream once daily as needed for itching  02/16/24 Thankfully, the redness on both anterior shins is fading.  Instead of the right fiery red color from Friday, it is now more violaceous and lighter.  The skin is starting to crack and peel.  The itching has improved dramatically.  Past Medical History:  Diagnosis Date   Anticoagulant long-term use    eliquis    Chronic kidney disease    Chronic sinusitis    Dysrhythmia    A.fib,   GERD (gastroesophageal reflux disease)    Heart failure with preserved ejection fraction (HCC)    Hemorrhoids    History of colitis 10/2016   campylobacter   Hyperlipidemia    Hypertension    Multinodular goiter    per pt has had 2 biopsy's both benign   NSTEMI (non-ST elevated myocardial infarction) (HCC)    12/2018- normal coronary arteries on cath, poss coronary vasospasm.    Osteoarthritis    all over  and [facility] joint left shoulder   Paroxysmal atrial fibrillation [facility]) EP cardiologist--  dr kelsie   s/p PVI by Dr kelsie 07/16/2013;   pt first dx 2008,  recurrence 2014 afib/flutter and tachy-brady   Rotator cuff tear, left    Shoulder impingement, left    Status post placement of  implantable loop recorder    original placement 07-26-2014;  removal / replacement 10-17-2017  by dr allred   Type 2 diabetes mellitus (HCC)    followed by pcp   Past Surgical History:  Procedure Laterality Date   ATRIAL FIBRILLATION ABLATION N/A 07/16/2013   PVI and CTI ablation by Dr kelsie   CARDIAC CATHETERIZATION     01/11/2019- Normal coronary arteries.    COLONOSCOPY  09/10/2011   Dr. Mavis: normal, 10 year follow up.   CYSTOSCOPY W/ URETERAL STENT PLACEMENT Right 10/22/2023   Procedure: CYSTOSCOPY, WITH RETROGRADE PYELOGRAM, RIGHT URETEROSCOPY, PERFORMANCE OF RIGHT RENAL WASHINGS AND URETERAL STENT INSERTION;  Surgeon: Matilda Senior, MD;  Location: [facility];  Service: Urology;  Laterality: Right;   implantable loop recorder removal  04/25/2021   MDT LINQ removed by Dr kelsie   LEFT HEART CATH AND CORONARY ANGIOGRAPHY N/A 01/11/2019   Procedure: LEFT HEART CATH AND CORONARY ANGIOGRAPHY;  Surgeon: Court Dorn PARAS, MD;  [facility]: [facility];  Service: Cardiovascular;  Laterality: N/A;   LESION REMOVAL N/A 05/03/2013   Procedure: EXCISION CYST, BACK;  Surgeon: Oneil DELENA Mavis, MD;  Location: [facility];  Service: General;  Laterality: N/A;   LOOP RECORDER IMPLANT N/A 07/26/2014   Procedure: LOOP RECORDER IMPLANT;  Surgeon: Lynwood kelsie, MD;  Location: [facility] CATH LAB;  Service: Cardiovascular;  Laterality: N/A;   LOOP RECORDER INSERTION N/A 10/17/2017   MDT previously implanted ILR for afib management at [facility].  old device removed and new device placed by Dr Kelsie   LOOP RECORDER REMOVAL N/A 10/17/2017   MDT ILR removed with new device subsequently replaced   SHOULDER ARTHROSCOPY WITH ROTATOR CUFF REPAIR AND SUBACROMIAL DECOMPRESSION Left 01/07/2019   Procedure: Left shoulder subacromial decompression, distal clavicle resection, extensive debridement,;  Surgeon: Sharl Selinda Dover, MD;  Location: [facility];  Service: Orthopedics;  Laterality: Left;   TEE  WITHOUT CARDIOVERSION N/A 07/15/2013   Procedure: TRANSESOPHAGEAL ECHOCARDIOGRAM (TEE);  Surgeon: Redell GORMAN Shallow, MD;  Location: [facility] ENDOSCOPY;  Service: Cardiovascular;  Laterality: N/A;   TONSILLECTOMY  age 69   TRANSANAL HEMORRHOIDAL DEARTERIALIZATION N/A 09/22/2015   Procedure: TRANSANAL HEMORRHOIDAL LIGATION/PEXY EUA POSSIBLE HEMORRHOIDECTOMY ;  Surgeon: Elspeth Schultze, MD;  Location: [facility];  Service: General;  Laterality: N/A;   Current Outpatient Medications on File Prior to Visit  Medication Sig Dispense Refill   amLODipine  (NORVASC ) 5 MG tablet Take 1 tablet (5 mg total) by mouth daily. 90 tablet 3   apixaban  (ELIQUIS ) 5 MG TABS tablet Take 1 tablet (5 mg total) by mouth 2 (two) times daily. 180 tablet 3   azelastine  (ASTELIN ) 0.1 % nasal spray Place 1 spray into both nostrils 2 (two) times daily. Use in each nostril as directed- helps with postnasal drip 30 mL 1   diazepam  (VALIUM ) 5 MG tablet TAKE 1 TABLET(5 MG) BY MOUTH AT BEDTIME AS NEEDED FOR SLEEP 30 tablet 3   estradiol  (ESTRACE ) 0.1 MG/GM vaginal cream Place 1 Applicatorful vaginally at bedtime. 42.5 g 12   glucose blood (FREESTYLE PRECISION NEO TEST) test strip DX: E11.9 Use to check blood sugar daily 100 strip 5   HYDROcodone -acetaminophen  (NORCO) 10-325 MG tablet Take 1 tablet by mouth every 6 (six) hours as needed. 120 tablet 0   Lancets (FREESTYLE) lancets Use to check blood sugar daily. 100 each 5   lisinopril  (ZESTRIL ) 40 MG tablet Take 1 tablet (40 mg total) by mouth daily. 90 tablet 3   mometasone  (ELOCON ) 0.1 % cream Apply topically daily. 15 g 1   ondansetron  (ZOFRAN ) 4 MG tablet Take 1 tablet (4 mg total) by mouth every 8 (eight) hours as needed for nausea or vomiting. 30 tablet 0   pantoprazole  (PROTONIX ) 40 MG tablet Take 1 tablet (40 mg total) by mouth daily. 90 tablet 3   predniSONE  (DELTASONE ) 20 MG tablet 3 tabs poqday 1-2, 2 tabs poqday 3-4, 1 tab poqday 5-6 12 tablet 0   promethazine  (PHENERGAN ) 12.5 MG  tablet Take 1 tablet (12.5 mg total) by mouth every 6 (six) hours as needed for nausea or vomiting. 30 tablet 3   simvastatin  (ZOCOR ) 40 MG tablet Take 1 tablet (40 mg total) by mouth daily at 6 PM. 90 tablet 2   spironolactone  (ALDACTONE ) 25 MG tablet Take 0.5 tablets (12.5 mg total) by mouth daily. 90 tablet 3   tamsulosin (FLOMAX) 0.4 MG CAPS capsule Take by mouth.     No current facility-administered medications on file prior to visit.     Allergies  Allergen Reactions   Clindamycin /Lincomycin Rash   Doxycycline Other (See Comments)    Makes heart race   Keflex  [Cephalexin ] Nausea And Vomiting   Sulfonic Acid (3,5-Dibromo-4-H-Ox-Benz) Other (See Comments)    Unknown    Adhesive [Tape] Rash   Latex Rash  Sulfonamide Derivatives Nausea And Vomiting   Social History   Socioeconomic History   Marital status: Married    Spouse name: Garen Dame   Number of children: 2   Years of education: 12   Highest education level: 12th grade  Occupational History   Occupation: Advertising copywriter: RETIRED    Comment: Full time  Tobacco Use   Smoking status: Former    Current packs/day: 0.00    Types: Cigarettes    Start date: 01/03/1960    Quit date: 01/02/1994    Years since quitting: 30.1    Passive exposure: Past   Smokeless tobacco: Never  Vaping Use   Vaping status: Never Used  Substance and Sexual Activity   Alcohol use: No   Drug use: No   Sexual activity: Not Currently    Partners: Male  Other Topics Concern   Not on file  Social History Narrative   One child deceased from homicide at age 15    Social Drivers of Health   Financial Resource Strain: Low Risk  (05/21/2022)   Overall Financial Resource Strain (CARDIA)    Difficulty of Paying Living Expenses: Not hard at all  Food Insecurity: No Food Insecurity (10/23/2023)   Hunger Vital Sign    Worried About Running Out of Food in the Last Year: Never true    Ran Out of Food in the Last Year: Never true   Transportation Needs: No Transportation Needs (10/23/2023)   PRAPARE - Administrator, Civil Service (Medical): No    Lack of Transportation (Non-Medical): No  Physical Activity: Inactive (05/21/2022)   Exercise Vital Sign    Days of Exercise per Week: 0 days    Minutes of Exercise per Session: 0 min  Stress: Stress Concern Present (05/21/2022)   [employer] of Occupational Health - Occupational Stress Questionnaire    Feeling of Stress : To some extent  Social Connections: Moderately Isolated (10/23/2023)   Social Connection and Isolation Panel    Frequency of Communication with Friends and Family: More than three times a week    Frequency of Social Gatherings with Friends and Family: Once a week    Attends Religious Services: 1 to 4 times per year    Active Member of [employer] or Organizations: No    Attends Banker Meetings: Never    Marital Status: Widowed  Intimate Partner Violence: Not At Risk (10/23/2023)   Humiliation, Afraid, Rape, and Kick questionnaire    Fear of Current or Ex-Partner: No    Emotionally Abused: No    Physically Abused: No    Sexually Abused: No     Review of Systems  All other systems reviewed and are negative.      Objective:   Physical Exam Vitals reviewed.  Constitutional:      General: She is not in acute distress.    Appearance: Normal appearance. She is not ill-appearing or toxic-appearing.  Cardiovascular:     Rate and Rhythm: Normal rate and regular rhythm.     Pulses: Normal pulses.     Heart sounds: Normal heart sounds. No murmur heard.    No friction rub. No gallop.  Pulmonary:     Effort: Pulmonary effort is normal. No respiratory distress.     Breath sounds: No stridor. No wheezing, rhonchi or rales.  Abdominal:     General: Abdomen is flat. Bowel sounds are normal. There is no distension.     Palpations: Abdomen is  soft.     Tenderness: There is no abdominal tenderness. There is no guarding.   Musculoskeletal:     Cervical back: Neck supple.  Skin:    Findings: Erythema and rash present. Rash is macular. Rash is not crusting, nodular, papular, pustular, scaling or urticarial.      Neurological:     General: No focal deficit present.     Mental Status: She is alert and oriented to person, place, and time.     Cranial Nerves: No cranial nerve deficit.     Sensory: No sensory deficit.     Motor: No weakness.     Coordination: Coordination normal.     Gait: Gait normal.  Psychiatric:        Mood and Affect: Mood normal.        Thought Content: Thought content normal.           Assessment & Plan:  Irritant contact dermatitis due to cosmetics  Controlled type 2 diabetes mellitus with complication, without long-term current use of insulin  (HCC) - Plan: glucose blood (FREESTYLE PRECISION NEO TEST) test strip Has improved 50% or more from Friday.  Complete the prednisone  taper pack and use Elocon  1-2 times a day as needed for itching.  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

## 2024-02-17 ENCOUNTER — Ambulatory Visit: Admitting: Urology

## 2024-02-23 ENCOUNTER — Encounter (HOSPITAL_COMMUNITY)
Admission: RE | Admit: 2024-02-23 | Discharge: 2024-02-23 | Disposition: A | Discharge status: Home / Self Care | Source: Ambulatory Visit | Attending: Urology | Admitting: Urology

## 2024-02-23 ENCOUNTER — Ambulatory Visit (INDEPENDENT_AMBULATORY_CARE_PROVIDER_SITE_OTHER): Admitting: Urology

## 2024-02-23 ENCOUNTER — Encounter: Payer: Self-pay | Admitting: Urology

## 2024-02-23 VITALS — BP 115/67 | HR 52

## 2024-02-23 DIAGNOSIS — N1330 Unspecified hydronephrosis: Secondary | ICD-10-CM | POA: Diagnosis not present

## 2024-02-23 NOTE — Progress Notes (Signed)
 02/23/2024 3:15 PM   Erika Lee [birth year] [id number]  Referring provider: Duanne Butler DASEN, MD [address] [place],  [place] [address]  Right hydronephrosis   HPI: Erika Lee is a 78yo here for followup for right hydronephrosis. She underwent diagnostic ureteroscopy, ureteral biopsy and stent placement. Pathology benign. Her stricture recurred and her right hydronephrosis worsened. She is scheduled for diagnostic ureteroscopy, dilation of stricture and stent placement.    PMH: Past Medical History:  Diagnosis Date   Anticoagulant long-term use    eliquis    Chronic kidney disease    Chronic sinusitis    Dysrhythmia    A.fib,   GERD (gastroesophageal reflux disease)    Heart failure with preserved ejection fraction (HCC)    Hemorrhoids    History of colitis 10/2016   campylobacter   Hyperlipidemia    Hypertension    Multinodular goiter    per pt has had 2 biopsy's both benign   NSTEMI (non-ST elevated myocardial infarction) (HCC)    12/2018- normal coronary arteries on cath, poss coronary vasospasm.    Osteoarthritis    all over  and [facility] joint left shoulder   Paroxysmal atrial fibrillation [facility] EP cardiologist--  dr kelsie   s/p PVI by Dr kelsie 07/16/2013;   pt first dx 2008,  recurrence 2014 afib/flutter and tachy-brady   Rotator cuff tear, left    Shoulder impingement, left    Status post placement of implantable loop recorder    original placement 07-26-2014;  removal / replacement 10-17-2017  by dr allred   Type 2 diabetes mellitus (HCC)    followed by pcp    Surgical History: Past Surgical History:  Procedure Laterality Date   ATRIAL FIBRILLATION ABLATION N/A 07/16/2013   PVI and CTI ablation by Dr kelsie   CARDIAC CATHETERIZATION     01/11/2019- Normal coronary arteries.    COLONOSCOPY  09/10/2011   Dr. Mavis: normal, 10 year follow up.   CYSTOSCOPY W/ URETERAL STENT PLACEMENT Right 10/22/2023   Procedure: CYSTOSCOPY, WITH RETROGRADE  PYELOGRAM, RIGHT URETEROSCOPY, PERFORMANCE OF RIGHT RENAL WASHINGS AND URETERAL STENT INSERTION;  Surgeon: Matilda Senior, MD;  Location: [facility];  Service: Urology;  Laterality: Right;   implantable loop recorder removal  04/25/2021   MDT LINQ removed by Dr kelsie   LEFT HEART CATH AND CORONARY ANGIOGRAPHY N/A 01/11/2019   Procedure: LEFT HEART CATH AND CORONARY ANGIOGRAPHY;  Surgeon: Court Dorn PARAS, MD;  Location: [facility];  Service: Cardiovascular;  Laterality: N/A;   LESION REMOVAL N/A 05/03/2013   Procedure: EXCISION CYST, BACK;  Surgeon: Oneil DELENA Mavis, MD;  Location: [facility];  Service: General;  Laterality: N/A;   LOOP RECORDER IMPLANT N/A 07/26/2014   Procedure: LOOP RECORDER IMPLANT;  Surgeon: Lynwood kelsie, MD;  Location: [facility] CATH LAB;  Service: Cardiovascular;  Laterality: N/A;   LOOP RECORDER INSERTION N/A 10/17/2017   MDT previously implanted ILR for afib management at [facility].  old device removed and new device placed by Dr kelsie   LOOP RECORDER REMOVAL N/A 10/17/2017   MDT ILR removed with new device subsequently replaced   SHOULDER ARTHROSCOPY WITH ROTATOR CUFF REPAIR AND SUBACROMIAL DECOMPRESSION Left 01/07/2019   Procedure: Left shoulder subacromial decompression, distal clavicle resection, extensive debridement,;  Surgeon: Sharl Selinda Dover, MD;  Location: [facility];  Service: Orthopedics;  Laterality: Left;   TEE WITHOUT CARDIOVERSION N/A 07/15/2013   Procedure: TRANSESOPHAGEAL ECHOCARDIOGRAM (TEE);  Surgeon: Redell GORMAN Shallow, MD;  Location:  [facility];  Service: Cardiovascular;  Laterality: N/A;   TONSILLECTOMY  age 79   TRANSANAL HEMORRHOIDAL DEARTERIALIZATION N/A 09/22/2015   Procedure: TRANSANAL HEMORRHOIDAL LIGATION/PEXY EUA POSSIBLE HEMORRHOIDECTOMY ;  Surgeon: Elspeth Schultze, MD;  Location: [facility];  Service: General;  Laterality: N/A;    Home Medications:  Allergies as of 02/23/2024       Reactions   Clindamycin /lincomycin Rash    Doxycycline Other (See Comments)   Makes heart race   Keflex  [cephalexin ] Nausea And Vomiting   Sulfonic Acid (3,5-dibromo-4-h-ox-benz) Other (See Comments)   Unknown    Adhesive [tape] Rash   Latex Rash   Sulfonamide Derivatives Nausea And Vomiting        Medication List        Accurate as of February 23, 2024  3:15 PM. If you have any questions, ask your nurse or doctor.          amLODipine  5 MG tablet Commonly known as: NORVASC  Take 1 tablet (5 mg total) by mouth daily.   apixaban  5 MG Tabs tablet Commonly known as: Eliquis  Take 1 tablet (5 mg total) by mouth 2 (two) times daily.   azelastine  0.1 % nasal spray Commonly known as: ASTELIN  Place 1 spray into both nostrils 2 (two) times daily. Use in each nostril as directed- helps with postnasal drip   diazepam  5 MG tablet Commonly known as: VALIUM  TAKE 1 TABLET(5 MG) BY MOUTH AT BEDTIME AS NEEDED FOR SLEEP   estradiol  0.1 MG/GM vaginal cream Commonly known as: ESTRACE  Place 1 Applicatorful vaginally at bedtime.   freestyle lancets Use to check blood sugar daily.   FreeStyle Precision Neo Test test strip Generic drug: glucose blood DX: E11.9 Use to check blood sugar daily   HYDROcodone -acetaminophen  10-325 MG tablet Commonly known as: NORCO Take 1 tablet by mouth every 6 (six) hours as needed.   lisinopril  40 MG tablet Commonly known as: ZESTRIL  Take 1 tablet (40 mg total) by mouth daily.   mometasone  0.1 % cream Commonly known as: ELOCON  Apply topically daily.   ondansetron  4 MG tablet Commonly known as: Zofran  Take 1 tablet (4 mg total) by mouth every 8 (eight) hours as needed for nausea or vomiting.   pantoprazole  40 MG tablet Commonly known as: PROTONIX  Take 1 tablet (40 mg total) by mouth daily.   predniSONE  20 MG tablet Commonly known as: DELTASONE  3 tabs poqday 1-2, 2 tabs poqday 3-4, 1 tab poqday 5-6   promethazine  12.5 MG tablet Commonly known as: PHENERGAN  Take 1 tablet (12.5 mg  total) by mouth every 6 (six) hours as needed for nausea or vomiting.   simvastatin  40 MG tablet Commonly known as: ZOCOR  Take 1 tablet (40 mg total) by mouth daily at 6 PM.   spironolactone  25 MG tablet Commonly known as: ALDACTONE  Take 0.5 tablets (12.5 mg total) by mouth daily.   tamsulosin 0.4 MG Caps capsule Commonly known as: FLOMAX Take by mouth.        Allergies:  Allergies  Allergen Reactions   Clindamycin /Lincomycin Rash   Doxycycline Other (See Comments)    Makes heart race   Keflex  [Cephalexin ] Nausea And Vomiting   Sulfonic Acid (3,5-Dibromo-4-H-Ox-Benz) Other (See Comments)    Unknown    Adhesive [Tape] Rash   Latex Rash   Sulfonamide Derivatives Nausea And Vomiting    Family History: Family History  Problem Relation Age of Onset   Stroke Mother 45   Stroke Maternal Grandmother        stroke  Other Maternal Grandfather        heart dropsy kidney issues   Kidney disease Maternal Grandfather    Colon cancer Neg Hx     Social History:  reports that she quit smoking about 30 years ago. Her smoking use included cigarettes. She started smoking about 64 years ago. She has been exposed to tobacco smoke. She has never used smokeless tobacco. She reports that she does not drink alcohol and does not use drugs.  ROS: All other review of systems were reviewed and are negative except what is noted above in HPI  Physical Exam: BP 115/67   Pulse (!) 52   LMP  (LMP Unknown)   Constitutional:  Alert and oriented, No acute distress. HEENT: [place] AT, moist mucus membranes.  Trachea midline, no masses. Cardiovascular: No clubbing, cyanosis, or edema. Respiratory: Normal respiratory effort, no increased work of breathing. GI: Abdomen is soft, nontender, nondistended, no abdominal masses GU: No CVA tenderness.  Lymph: No cervical or inguinal lymphadenopathy. Skin: No rashes, bruises or suspicious lesions. Neurologic: Grossly intact, no focal deficits, moving all 4  extremities. Psychiatric: Normal mood and affect.  Laboratory Data: Lab Results  Component Value Date   WBC 6.2 01/27/2024   HGB 12.9 01/27/2024   HCT 38.8 01/27/2024   MCV 90.2 01/27/2024   PLT 185 01/27/2024    Lab Results  Component Value Date   CREATININE 1.29 (H) 01/27/2024    No results found for: PSA  No results found for: TESTOSTERONE  Lab Results  Component Value Date   HGBA1C 6.0 (H) 01/22/2024    Urinalysis    Component Value Date/Time   COLORURINE YELLOW 01/05/2024 1112   APPEARANCEUR CLEAR 01/05/2024 1112   APPEARANCEUR Clear 11/18/2023 1332   LABSPEC 1.010 01/05/2024 1112   PHURINE 6.5 01/05/2024 1112   GLUCOSEU NEGATIVE 01/05/2024 1112   HGBUR NEGATIVE 01/05/2024 1112   BILIRUBINUR NEGATIVE 11/19/2023 1900   BILIRUBINUR Negative 11/18/2023 1332   KETONESUR NEGATIVE 01/05/2024 1112   PROTEINUR NEGATIVE 01/05/2024 1112   UROBILINOGEN 0.2 08/16/2014 1958   NITRITE NEGATIVE 01/05/2024 1112   LEUKOCYTESUR 2+ (A) 01/05/2024 1112    Lab Results  Component Value Date   LABMICR See below: 11/18/2023   WBCUA 6-10 (A) 11/18/2023   LABEPIT 0-10 11/18/2023   BACTERIA FEW (A) 01/05/2024    Pertinent Imaging: CT 01/07/24: Images reviewed and discussed with the patient  No results found for this or any previous visit.  No results found for this or any previous visit.  No results found for this or any previous visit.  No results found for this or any previous visit.  Results for orders placed during the hospital encounter of 11/28/23  US  RENAL  Narrative CLINICAL DATA:  Hydronephrosis of right kidney  EXAM: RENAL / URINARY TRACT ULTRASOUND COMPLETE  COMPARISON:  10/22/2023 CT  FINDINGS: Right Kidney:  Renal measurements: 10.1 x 4.8 x 5.2 cm = volume: 132 mL. Normal echotexture. Moderate right hydronephrosis. No mass.  Left Kidney:  Renal measurements: 9.8 x 5.8 x 4.9 cm = volume: 145 mL. Echogenicity within normal limits. No mass or  hydronephrosis visualized.  Bladder:  Appears normal for degree of bladder distention.  Other:  None.  IMPRESSION: Moderate right hydronephrosis.   Electronically Signed By: Franky Crease M.D. On: 12/06/2023 22:24  No results found for this or any previous visit.  Results for orders placed during the hospital encounter of 09/30/23  CT HEMATURIA WORKUP  Narrative CLINICAL DATA:  Unintentional  weight loss and gross hematuria  EXAM: CT CHEST WITH CONTRAST  CT ABDOMEN AND PELVIS WITH AND WITHOUT CONTRAST  TECHNIQUE: Multidetector CT imaging of the chest was performed during intravenous contrast administration. Multidetector CT imaging of the abdomen and pelvis was performed following the standard protocol before and during bolus administration of intravenous contrast.  RADIATION DOSE REDUCTION: This exam was performed according to the departmental dose-optimization program which includes automated exposure control, adjustment of the mA and/or kV according to patient size and/or use of iterative reconstruction technique.  CONTRAST:  OMNIPAQUE  IOHEXOL  350 MG/ML SOLN  COMPARISON:  CT abdomen and pelvis dated 08/14/2023, CTA chest dated 01/09/2019  FINDINGS: CT CHEST FINDINGS  Cardiovascular: Multichamber cardiomegaly. No significant pericardial fluid/thickening. Great vessels are normal in course and caliber. No central pulmonary emboli. Coronary artery calcifications.  Mediastinum/Nodes: Enlarged, heterogeneous thyroid  gland, previously evaluated. Normal esophagus. No pathologically enlarged axillary, supraclavicular, mediastinal, or hilar lymph nodes.  Lungs/Pleura: The central airways are patent. No suspicious pulmonary nodules. No focal consolidation. No pneumothorax. No pleural effusion.  Musculoskeletal: No acute or abnormal lytic or blastic osseous lesions. Multilevel degenerative changes of the thoracic spine.  CT ABDOMEN PELVIS  FINDINGS  Hepatobiliary: Subcentimeter segment 5 hypodensity (9:21), too small to characterize. No intra or extrahepatic biliary ductal dilation. Cholelithiasis.  Pancreas: No focal lesions or main ductal dilation.  Spleen: Normal in size without focal abnormality.  Adrenals/Urinary Tract: No adrenal nodules. Similar mild-to-moderate right hydroureteronephrosis to the level of the distal right ureter, where there is abrupt cut off and enhancing soft tissue density (12:46, 13:65). Delayed right nephrogram. No suspicious renal masses or calculi. No suspicious filling defect visualized within the opacified portions of the left renal collecting system or ureter on delayed imaging. No focal bladder wall thickening.  Stomach/Bowel: Normal appearance of the stomach. No evidence of bowel wall thickening or inflammatory changes. Dilated rectum contains large volume stool. Moderate to large volume stool within the remainder of the colon. Appendix is not discretely seen.  Vascular/Lymphatic: Aortic atherosclerosis. No enlarged abdominal or pelvic lymph nodes.  Reproductive: Calcified uterine leiomyoma.  No adnexal masses.  Other: No free fluid, fluid collection, or free air.  Musculoskeletal: No acute or abnormal lytic or blastic osseous findings. Multilevel degenerative changes of the lumbar spine.  IMPRESSION: CT CHEST:  1. No acute intrathoracic abnormality. 2. Multichamber cardiomegaly. 3. Coronary artery calcifications. Assessment for potential risk factor modification, dietary therapy or pharmacologic therapy may be warranted, if clinically indicated.  CT ABDOMEN AND PELVIS:  1. Similar mild-to-moderate right hydroureteronephrosis to the level of the distal right ureter, where there is abrupt cut off and enhancing soft tissue density, suspicious for urothelial neoplasm. Recommend further evaluation with cystoscopy. 2. Dilated rectum contains large volume stool. Moderate to  large volume stool within the remainder of the colon. Recommend correlation with constipation. 3. Cholelithiasis.  Aortic Atherosclerosis (ICD10-I70.0).   Electronically Signed By: Limin  Xu M.D. On: 10/01/2023 16:21  Results for orders placed during the hospital encounter of 08/14/23  CT RENAL STONE STUDY  Narrative CLINICAL DATA:  Hematuria  EXAM: CT ABDOMEN AND PELVIS WITHOUT CONTRAST  TECHNIQUE: Multidetector CT imaging of the abdomen and pelvis was performed following the standard protocol without IV contrast.  RADIATION DOSE REDUCTION: This exam was performed according to the departmental dose-optimization program which includes automated exposure control, adjustment of the mA and/or kV according to patient size and/or use of iterative reconstruction technique.  COMPARISON:  None Available.  FINDINGS: Lower chest: Bases clear.  No pericardial or pleural effusions.  Hepatobiliary: No hepatic parenchymal abnormalities. Gallbladder appears distended with small calcified stones or milk of calcium . No pericholecystic inflammatory changes or fluid.  Pancreas: Unremarkable. No pancreatic ductal dilatation or surrounding inflammatory changes.  Spleen: Normal in size without focal abnormality.  Adrenals/Urinary Tract: No adrenal lesions. No intrarenal stones. Right-sided hydronephrosis and hydroureter. No ureteral stones. Unremarkable urinary bladder.  Stomach/Bowel: Stomach is within normal limits. Appendix appears normal. No evidence of bowel wall thickening, distention, or inflammatory changes.  Vascular/Lymphatic: Aortic atherosclerosis. No enlarged abdominal or pelvic lymph nodes.  Reproductive: Calcified fibroid.  Other: No abdominal wall hernia or abnormality. No abdominopelvic ascites.  Musculoskeletal: Thoracolumbosacral degenerative changes.  IMPRESSION: 1. Right-sided hydronephrosis and hydroureter. No obstructing stones identified. 2.  Distended gallbladder with calcified stones or milk of calcium . 3. Calcified fibroid. 4. Aortic atherosclerosis.   Electronically Signed By: Fonda Field M.D. On: 08/30/2023 19:10   Assessment & Plan:    1. Hydronephrosis of right kidney (Primary) -patient to proceed with right ureteroscopy  - Urinalysis, Routine w reflex microscopic   No follow-ups on file.  Erika Clara, MD  [facility] [place]

## 2024-02-23 NOTE — Patient Instructions (Signed)
 Hydronephrosis  Hydronephrosis is the swelling of one or both kidneys due to a blockage that stops urine from flowing out of the body. Kidneys filter waste from the blood and produce urine. This condition can lead to kidney failure and may become life-threatening if not treated promptly. What are the causes? In infants and children, common causes include problems that occur when a baby is developing in the womb. These can include problems in the kidneys or in the tubes that drain urine into the bladder (ureters). In adults, common causes include: Kidney stones. Pregnancy. A tumor or cyst in the abdomen or pelvis. An enlarged prostate gland. Other causes include: Bladder infection. Scar tissue from a previous surgery or injury. A blood clot. Cancer of the prostate, bladder, uterus, ovary, or colon. What are the signs or symptoms? Symptoms of this condition include: Pain or discomfort in your side (flank) or abdomen. Swelling in your abdomen. Nausea and vomiting. Fever. Pain when passing urine. Feelings of urgency when you need to urinate. Urinating more often than normal. In some cases, you may not have any symptoms. How is this diagnosed? This condition may be diagnosed based on: Your symptoms and medical history. A physical exam. Blood and urine tests. Imaging tests, such as an ultrasound, CT scan, or MRI. A procedure to look at your urinary tract and bladder by inserting a scope into the urethra (cystoscopy). How is this treated? Treatment for this condition depends on where the blockage is, how long it has been there, and what caused it. The goal of treatment is to remove the blockage. Treatment may include: Antibiotic medicines to treat or prevent infection. A procedure to place a small, thin tube (stent) into a blocked ureter. The stent will keep the ureter open so that urine can drain through it. A nonsurgical procedure that crushes kidney stones with shock waves  (extracorporeal shock wave lithotripsy). If kidney failure occurs, treatment may include dialysis or a kidney transplant. Follow these instructions at home:  Take over-the-counter and prescription medicines only as told by your health care provider. If you were prescribed an antibiotic medicine, take it exactly as told by your health care provider. Do not stop taking the antibiotic even if you start to feel better. Rest and return to your normal activities as told by your health care provider. Ask your health care provider what activities are safe for you. Drink enough fluid to keep your urine pale yellow. Keep all follow-up visits. This is important. Contact a health care provider if: You continue to have symptoms after treatment. You develop new symptoms. Your urine becomes cloudy or bloody. You have a fever. Get help right away if: You have severe flank or abdominal pain. You cannot drink fluids without vomiting. Summary Hydronephrosis is the swelling of one or both kidneys due to a blockage that stops urine from flowing out of the body. Hydronephrosis can lead to kidney failure and may become life-threatening if not treated promptly. The goal of treatment is to remove the blockage. It may include a procedure to insert a stent into a blocked ureter, a procedure to break up kidney stones, or taking antibiotic medicines. Follow your health care provider's instructions for taking care of yourself at home, including instructions about drinking fluids, taking medicines, and limiting activities. This information is not intended to replace advice given to you by your health care provider. Make sure you discuss any questions you have with your health care provider. Document Revised: 03/18/2023 Document Reviewed: 03/18/2023 Elsevier  Patient Education  2024 [employer].

## 2024-02-23 NOTE — H&P (View-Only) (Signed)
 02/23/2024 3:15 PM   [name] [birth year] [id number]  Referring provider: Duanne Butler DASEN, MD [address] [place],  [place] [address]  Right hydronephrosis   HPI: Ms Barbe is a 78yo here for followup for right hydronephrosis. She underwent diagnostic ureteroscopy, ureteral biopsy and stent placement. Pathology benign. Her stricture recurred and her right hydronephrosis worsened. She is scheduled for diagnostic ureteroscopy, dilation of stricture and stent placement.    PMH: Past Medical History:  Diagnosis Date   Anticoagulant long-term use    eliquis    Chronic kidney disease    Chronic sinusitis    Dysrhythmia    A.fib,   GERD (gastroesophageal reflux disease)    Heart failure with preserved ejection fraction (HCC)    Hemorrhoids    History of colitis 10/2016   campylobacter   Hyperlipidemia    Hypertension    Multinodular goiter    per pt has had 2 biopsy's both benign   NSTEMI (non-ST elevated myocardial infarction) (HCC)    12/2018- normal coronary arteries on cath, poss coronary vasospasm.    Osteoarthritis    all over  and [facility] joint left shoulder   Paroxysmal atrial fibrillation [facility] EP cardiologist--  dr kelsie   s/p PVI by Dr kelsie 07/16/2013;   pt first dx 2008,  recurrence 2014 afib/flutter and tachy-brady   Rotator cuff tear, left    Shoulder impingement, left    Status post placement of implantable loop recorder    original placement 07-26-2014;  removal / replacement 10-17-2017  by dr allred   Type 2 diabetes mellitus (HCC)    followed by pcp    Surgical History: Past Surgical History:  Procedure Laterality Date   ATRIAL FIBRILLATION ABLATION N/A 07/16/2013   PVI and CTI ablation by Dr kelsie   CARDIAC CATHETERIZATION     01/11/2019- Normal coronary arteries.    COLONOSCOPY  09/10/2011   Dr. Mavis: normal, 10 year follow up.   CYSTOSCOPY W/ URETERAL STENT PLACEMENT Right 10/22/2023   Procedure: CYSTOSCOPY, WITH RETROGRADE  PYELOGRAM, RIGHT URETEROSCOPY, PERFORMANCE OF RIGHT RENAL WASHINGS AND URETERAL STENT INSERTION;  Surgeon: Matilda Senior, MD;  Location: [facility];  Service: Urology;  Laterality: Right;   implantable loop recorder removal  04/25/2021   MDT LINQ removed by Dr kelsie   LEFT HEART CATH AND CORONARY ANGIOGRAPHY N/A 01/11/2019   Procedure: LEFT HEART CATH AND CORONARY ANGIOGRAPHY;  Surgeon: Court Dorn PARAS, MD;  Location: [facility];  Service: Cardiovascular;  Laterality: N/A;   LESION REMOVAL N/A 05/03/2013   Procedure: EXCISION CYST, BACK;  Surgeon: Oneil DELENA Mavis, MD;  Location: [facility];  Service: General;  Laterality: N/A;   LOOP RECORDER IMPLANT N/A 07/26/2014   Procedure: LOOP RECORDER IMPLANT;  Surgeon: Lynwood kelsie, MD;  Location: [facility] CATH LAB;  Service: Cardiovascular;  Laterality: N/A;   LOOP RECORDER INSERTION N/A 10/17/2017   MDT previously implanted ILR for afib management at [facility].  old device removed and new device placed by Dr kelsie   LOOP RECORDER REMOVAL N/A 10/17/2017   MDT ILR removed with new device subsequently replaced   SHOULDER ARTHROSCOPY WITH ROTATOR CUFF REPAIR AND SUBACROMIAL DECOMPRESSION Left 01/07/2019   Procedure: Left shoulder subacromial decompression, distal clavicle resection, extensive debridement,;  Surgeon: Sharl Selinda Dover, MD;  Location: [facility];  Service: Orthopedics;  Laterality: Left;   TEE WITHOUT CARDIOVERSION N/A 07/15/2013   Procedure: TRANSESOPHAGEAL ECHOCARDIOGRAM (TEE);  Surgeon: Redell GORMAN Shallow, MD;  Location:  [facility];  Service: Cardiovascular;  Laterality: N/A;   TONSILLECTOMY  age 79   TRANSANAL HEMORRHOIDAL DEARTERIALIZATION N/A 09/22/2015   Procedure: TRANSANAL HEMORRHOIDAL LIGATION/PEXY EUA POSSIBLE HEMORRHOIDECTOMY ;  Surgeon: Elspeth Schultze, MD;  Location: [facility];  Service: General;  Laterality: N/A;    Home Medications:  Allergies as of 02/23/2024       Reactions   Clindamycin /lincomycin Rash    Doxycycline Other (See Comments)   Makes heart race   Keflex  [cephalexin ] Nausea And Vomiting   Sulfonic Acid (3,5-dibromo-4-h-ox-benz) Other (See Comments)   Unknown    Adhesive [tape] Rash   Latex Rash   Sulfonamide Derivatives Nausea And Vomiting        Medication List        Accurate as of February 23, 2024  3:15 PM. If you have any questions, ask your nurse or doctor.          amLODipine  5 MG tablet Commonly known as: NORVASC  Take 1 tablet (5 mg total) by mouth daily.   apixaban  5 MG Tabs tablet Commonly known as: Eliquis  Take 1 tablet (5 mg total) by mouth 2 (two) times daily.   azelastine  0.1 % nasal spray Commonly known as: ASTELIN  Place 1 spray into both nostrils 2 (two) times daily. Use in each nostril as directed- helps with postnasal drip   diazepam  5 MG tablet Commonly known as: VALIUM  TAKE 1 TABLET(5 MG) BY MOUTH AT BEDTIME AS NEEDED FOR SLEEP   estradiol  0.1 MG/GM vaginal cream Commonly known as: ESTRACE  Place 1 Applicatorful vaginally at bedtime.   freestyle lancets Use to check blood sugar daily.   FreeStyle Precision Neo Test test strip Generic drug: glucose blood DX: E11.9 Use to check blood sugar daily   HYDROcodone -acetaminophen  10-325 MG tablet Commonly known as: NORCO Take 1 tablet by mouth every 6 (six) hours as needed.   lisinopril  40 MG tablet Commonly known as: ZESTRIL  Take 1 tablet (40 mg total) by mouth daily.   mometasone  0.1 % cream Commonly known as: ELOCON  Apply topically daily.   ondansetron  4 MG tablet Commonly known as: Zofran  Take 1 tablet (4 mg total) by mouth every 8 (eight) hours as needed for nausea or vomiting.   pantoprazole  40 MG tablet Commonly known as: PROTONIX  Take 1 tablet (40 mg total) by mouth daily.   predniSONE  20 MG tablet Commonly known as: DELTASONE  3 tabs poqday 1-2, 2 tabs poqday 3-4, 1 tab poqday 5-6   promethazine  12.5 MG tablet Commonly known as: PHENERGAN  Take 1 tablet (12.5 mg  total) by mouth every 6 (six) hours as needed for nausea or vomiting.   simvastatin  40 MG tablet Commonly known as: ZOCOR  Take 1 tablet (40 mg total) by mouth daily at 6 PM.   spironolactone  25 MG tablet Commonly known as: ALDACTONE  Take 0.5 tablets (12.5 mg total) by mouth daily.   tamsulosin 0.4 MG Caps capsule Commonly known as: FLOMAX Take by mouth.        Allergies:  Allergies  Allergen Reactions   Clindamycin /Lincomycin Rash   Doxycycline Other (See Comments)    Makes heart race   Keflex  [Cephalexin ] Nausea And Vomiting   Sulfonic Acid (3,5-Dibromo-4-H-Ox-Benz) Other (See Comments)    Unknown    Adhesive [Tape] Rash   Latex Rash   Sulfonamide Derivatives Nausea And Vomiting    Family History: Family History  Problem Relation Age of Onset   Stroke Mother 45   Stroke Maternal Grandmother        stroke  Other Maternal Grandfather        heart dropsy kidney issues   Kidney disease Maternal Grandfather    Colon cancer Neg Hx     Social History:  reports that she quit smoking about 30 years ago. Her smoking use included cigarettes. She started smoking about 64 years ago. She has been exposed to tobacco smoke. She has never used smokeless tobacco. She reports that she does not drink alcohol and does not use drugs.  ROS: All other review of systems were reviewed and are negative except what is noted above in HPI  Physical Exam: BP 115/67   Pulse (!) 52   LMP  (LMP Unknown)   Constitutional:  Alert and oriented, No acute distress. HEENT: [place] AT, moist mucus membranes.  Trachea midline, no masses. Cardiovascular: No clubbing, cyanosis, or edema. Respiratory: Normal respiratory effort, no increased work of breathing. GI: Abdomen is soft, nontender, nondistended, no abdominal masses GU: No CVA tenderness.  Lymph: No cervical or inguinal lymphadenopathy. Skin: No rashes, bruises or suspicious lesions. Neurologic: Grossly intact, no focal deficits, moving all 4  extremities. Psychiatric: Normal mood and affect.  Laboratory Data: Lab Results  Component Value Date   WBC 6.2 01/27/2024   HGB 12.9 01/27/2024   HCT 38.8 01/27/2024   MCV 90.2 01/27/2024   PLT 185 01/27/2024    Lab Results  Component Value Date   CREATININE 1.29 (H) 01/27/2024    No results found for: PSA  No results found for: TESTOSTERONE  Lab Results  Component Value Date   HGBA1C 6.0 (H) 01/22/2024    Urinalysis    Component Value Date/Time   COLORURINE YELLOW 01/05/2024 1112   APPEARANCEUR CLEAR 01/05/2024 1112   APPEARANCEUR Clear 11/18/2023 1332   LABSPEC 1.010 01/05/2024 1112   PHURINE 6.5 01/05/2024 1112   GLUCOSEU NEGATIVE 01/05/2024 1112   HGBUR NEGATIVE 01/05/2024 1112   BILIRUBINUR NEGATIVE 11/19/2023 1900   BILIRUBINUR Negative 11/18/2023 1332   KETONESUR NEGATIVE 01/05/2024 1112   PROTEINUR NEGATIVE 01/05/2024 1112   UROBILINOGEN 0.2 08/16/2014 1958   NITRITE NEGATIVE 01/05/2024 1112   LEUKOCYTESUR 2+ (A) 01/05/2024 1112    Lab Results  Component Value Date   LABMICR See below: 11/18/2023   WBCUA 6-10 (A) 11/18/2023   LABEPIT 0-10 11/18/2023   BACTERIA FEW (A) 01/05/2024    Pertinent Imaging: CT 01/07/24: Images reviewed and discussed with the patient  No results found for this or any previous visit.  No results found for this or any previous visit.  No results found for this or any previous visit.  No results found for this or any previous visit.  Results for orders placed during the hospital encounter of 11/28/23  US  RENAL  Narrative CLINICAL DATA:  Hydronephrosis of right kidney  EXAM: RENAL / URINARY TRACT ULTRASOUND COMPLETE  COMPARISON:  10/22/2023 CT  FINDINGS: Right Kidney:  Renal measurements: 10.1 x 4.8 x 5.2 cm = volume: 132 mL. Normal echotexture. Moderate right hydronephrosis. No mass.  Left Kidney:  Renal measurements: 9.8 x 5.8 x 4.9 cm = volume: 145 mL. Echogenicity within normal limits. No mass or  hydronephrosis visualized.  Bladder:  Appears normal for degree of bladder distention.  Other:  None.  IMPRESSION: Moderate right hydronephrosis.   Electronically Signed By: Franky Crease M.D. On: 12/06/2023 22:24  No results found for this or any previous visit.  Results for orders placed during the hospital encounter of 09/30/23  CT HEMATURIA WORKUP  Narrative CLINICAL DATA:  Unintentional  weight loss and gross hematuria  EXAM: CT CHEST WITH CONTRAST  CT ABDOMEN AND PELVIS WITH AND WITHOUT CONTRAST  TECHNIQUE: Multidetector CT imaging of the chest was performed during intravenous contrast administration. Multidetector CT imaging of the abdomen and pelvis was performed following the standard protocol before and during bolus administration of intravenous contrast.  RADIATION DOSE REDUCTION: This exam was performed according to the departmental dose-optimization program which includes automated exposure control, adjustment of the mA and/or kV according to patient size and/or use of iterative reconstruction technique.  CONTRAST:  OMNIPAQUE  IOHEXOL  350 MG/ML SOLN  COMPARISON:  CT abdomen and pelvis dated 08/14/2023, CTA chest dated 01/09/2019  FINDINGS: CT CHEST FINDINGS  Cardiovascular: Multichamber cardiomegaly. No significant pericardial fluid/thickening. Great vessels are normal in course and caliber. No central pulmonary emboli. Coronary artery calcifications.  Mediastinum/Nodes: Enlarged, heterogeneous thyroid  gland, previously evaluated. Normal esophagus. No pathologically enlarged axillary, supraclavicular, mediastinal, or hilar lymph nodes.  Lungs/Pleura: The central airways are patent. No suspicious pulmonary nodules. No focal consolidation. No pneumothorax. No pleural effusion.  Musculoskeletal: No acute or abnormal lytic or blastic osseous lesions. Multilevel degenerative changes of the thoracic spine.  CT ABDOMEN PELVIS  FINDINGS  Hepatobiliary: Subcentimeter segment 5 hypodensity (9:21), too small to characterize. No intra or extrahepatic biliary ductal dilation. Cholelithiasis.  Pancreas: No focal lesions or main ductal dilation.  Spleen: Normal in size without focal abnormality.  Adrenals/Urinary Tract: No adrenal nodules. Similar mild-to-moderate right hydroureteronephrosis to the level of the distal right ureter, where there is abrupt cut off and enhancing soft tissue density (12:46, 13:65). Delayed right nephrogram. No suspicious renal masses or calculi. No suspicious filling defect visualized within the opacified portions of the left renal collecting system or ureter on delayed imaging. No focal bladder wall thickening.  Stomach/Bowel: Normal appearance of the stomach. No evidence of bowel wall thickening or inflammatory changes. Dilated rectum contains large volume stool. Moderate to large volume stool within the remainder of the colon. Appendix is not discretely seen.  Vascular/Lymphatic: Aortic atherosclerosis. No enlarged abdominal or pelvic lymph nodes.  Reproductive: Calcified uterine leiomyoma.  No adnexal masses.  Other: No free fluid, fluid collection, or free air.  Musculoskeletal: No acute or abnormal lytic or blastic osseous findings. Multilevel degenerative changes of the lumbar spine.  IMPRESSION: CT CHEST:  1. No acute intrathoracic abnormality. 2. Multichamber cardiomegaly. 3. Coronary artery calcifications. Assessment for potential risk factor modification, dietary therapy or pharmacologic therapy may be warranted, if clinically indicated.  CT ABDOMEN AND PELVIS:  1. Similar mild-to-moderate right hydroureteronephrosis to the level of the distal right ureter, where there is abrupt cut off and enhancing soft tissue density, suspicious for urothelial neoplasm. Recommend further evaluation with cystoscopy. 2. Dilated rectum contains large volume stool. Moderate to  large volume stool within the remainder of the colon. Recommend correlation with constipation. 3. Cholelithiasis.  Aortic Atherosclerosis (ICD10-I70.0).   Electronically Signed By: Limin  Xu M.D. On: 10/01/2023 16:21  Results for orders placed during the hospital encounter of 08/14/23  CT RENAL STONE STUDY  Narrative CLINICAL DATA:  Hematuria  EXAM: CT ABDOMEN AND PELVIS WITHOUT CONTRAST  TECHNIQUE: Multidetector CT imaging of the abdomen and pelvis was performed following the standard protocol without IV contrast.  RADIATION DOSE REDUCTION: This exam was performed according to the departmental dose-optimization program which includes automated exposure control, adjustment of the mA and/or kV according to patient size and/or use of iterative reconstruction technique.  COMPARISON:  None Available.  FINDINGS: Lower chest: Bases clear.  No pericardial or pleural effusions.  Hepatobiliary: No hepatic parenchymal abnormalities. Gallbladder appears distended with small calcified stones or milk of calcium . No pericholecystic inflammatory changes or fluid.  Pancreas: Unremarkable. No pancreatic ductal dilatation or surrounding inflammatory changes.  Spleen: Normal in size without focal abnormality.  Adrenals/Urinary Tract: No adrenal lesions. No intrarenal stones. Right-sided hydronephrosis and hydroureter. No ureteral stones. Unremarkable urinary bladder.  Stomach/Bowel: Stomach is within normal limits. Appendix appears normal. No evidence of bowel wall thickening, distention, or inflammatory changes.  Vascular/Lymphatic: Aortic atherosclerosis. No enlarged abdominal or pelvic lymph nodes.  Reproductive: Calcified fibroid.  Other: No abdominal wall hernia or abnormality. No abdominopelvic ascites.  Musculoskeletal: Thoracolumbosacral degenerative changes.  IMPRESSION: 1. Right-sided hydronephrosis and hydroureter. No obstructing stones identified. 2.  Distended gallbladder with calcified stones or milk of calcium . 3. Calcified fibroid. 4. Aortic atherosclerosis.   Electronically Signed By: Fonda Field M.D. On: 08/30/2023 19:10   Assessment & Plan:    1. Hydronephrosis of right kidney (Primary) -patient to proceed with right ureteroscopy  - Urinalysis, Routine w reflex microscopic   No follow-ups on file.  [name], MD  [facility] [place]

## 2024-02-24 LAB — URINALYSIS, ROUTINE W REFLEX MICROSCOPIC
Bilirubin, UA: NEGATIVE
Glucose, UA: NEGATIVE
Ketones, UA: NEGATIVE
Leukocytes,UA: NEGATIVE
Nitrite, UA: NEGATIVE
Protein,UA: NEGATIVE
RBC, UA: NEGATIVE
Specific Gravity, UA: 1.01 (ref 1.005–1.030)
Urobilinogen, Ur: 0.2 mg/dL (ref 0.2–1.0)
pH, UA: 6 (ref 5.0–7.5)

## 2024-02-26 ENCOUNTER — Ambulatory Visit: Admitting: Urology

## 2024-02-26 ENCOUNTER — Encounter (HOSPITAL_COMMUNITY): Payer: Self-pay | Admitting: Urology

## 2024-02-26 ENCOUNTER — Encounter (HOSPITAL_COMMUNITY)
Admission: RE | Disposition: A | Discharge status: Home / Self Care | Payer: Self-pay | Source: Home / Self Care | Attending: Urology

## 2024-02-26 ENCOUNTER — Ambulatory Visit (HOSPITAL_COMMUNITY)
Admission: RE | Admit: 2024-02-26 | Discharge: 2024-02-26 | Disposition: A | Discharge status: Home / Self Care | Attending: Urology | Admitting: Urology

## 2024-02-26 ENCOUNTER — Other Ambulatory Visit: Payer: Self-pay

## 2024-02-26 ENCOUNTER — Ambulatory Visit (HOSPITAL_COMMUNITY)

## 2024-02-26 ENCOUNTER — Ambulatory Visit (HOSPITAL_COMMUNITY): Admitting: Anesthesiology

## 2024-02-26 ENCOUNTER — Ambulatory Visit (HOSPITAL_BASED_OUTPATIENT_CLINIC_OR_DEPARTMENT_OTHER): Admitting: Anesthesiology

## 2024-02-26 DIAGNOSIS — M1990 Unspecified osteoarthritis, unspecified site: Secondary | ICD-10-CM | POA: Diagnosis not present

## 2024-02-26 DIAGNOSIS — E119 Type 2 diabetes mellitus without complications: Secondary | ICD-10-CM | POA: Insufficient documentation

## 2024-02-26 DIAGNOSIS — Z79899 Other long term (current) drug therapy: Secondary | ICD-10-CM | POA: Insufficient documentation

## 2024-02-26 DIAGNOSIS — N1330 Unspecified hydronephrosis: Secondary | ICD-10-CM

## 2024-02-26 DIAGNOSIS — I480 Paroxysmal atrial fibrillation: Secondary | ICD-10-CM | POA: Diagnosis not present

## 2024-02-26 DIAGNOSIS — Z7901 Long term (current) use of anticoagulants: Secondary | ICD-10-CM | POA: Diagnosis not present

## 2024-02-26 DIAGNOSIS — E1122 Type 2 diabetes mellitus with diabetic chronic kidney disease: Secondary | ICD-10-CM | POA: Insufficient documentation

## 2024-02-26 DIAGNOSIS — I5032 Chronic diastolic (congestive) heart failure: Secondary | ICD-10-CM | POA: Insufficient documentation

## 2024-02-26 DIAGNOSIS — N2889 Other specified disorders of kidney and ureter: Secondary | ICD-10-CM | POA: Diagnosis not present

## 2024-02-26 DIAGNOSIS — K219 Gastro-esophageal reflux disease without esophagitis: Secondary | ICD-10-CM | POA: Diagnosis not present

## 2024-02-26 DIAGNOSIS — I509 Heart failure, unspecified: Secondary | ICD-10-CM | POA: Insufficient documentation

## 2024-02-26 DIAGNOSIS — Z87891 Personal history of nicotine dependence: Secondary | ICD-10-CM | POA: Diagnosis not present

## 2024-02-26 DIAGNOSIS — N1831 Chronic kidney disease, stage 3a: Secondary | ICD-10-CM | POA: Diagnosis not present

## 2024-02-26 DIAGNOSIS — I2510 Atherosclerotic heart disease of native coronary artery without angina pectoris: Secondary | ICD-10-CM | POA: Diagnosis not present

## 2024-02-26 DIAGNOSIS — I700 Atherosclerosis of aorta: Secondary | ICD-10-CM | POA: Insufficient documentation

## 2024-02-26 DIAGNOSIS — I110 Hypertensive heart disease with heart failure: Secondary | ICD-10-CM | POA: Insufficient documentation

## 2024-02-26 DIAGNOSIS — K8020 Calculus of gallbladder without cholecystitis without obstruction: Secondary | ICD-10-CM | POA: Diagnosis not present

## 2024-02-26 DIAGNOSIS — I130 Hypertensive heart and chronic kidney disease with heart failure and stage 1 through stage 4 chronic kidney disease, or unspecified chronic kidney disease: Secondary | ICD-10-CM | POA: Diagnosis not present

## 2024-02-26 DIAGNOSIS — N189 Chronic kidney disease, unspecified: Secondary | ICD-10-CM | POA: Diagnosis not present

## 2024-02-26 DIAGNOSIS — I252 Old myocardial infarction: Secondary | ICD-10-CM | POA: Insufficient documentation

## 2024-02-26 DIAGNOSIS — E785 Hyperlipidemia, unspecified: Secondary | ICD-10-CM | POA: Diagnosis not present

## 2024-02-26 LAB — GLUCOSE, CAPILLARY: Glucose-Capillary: 87 mg/dL (ref 70–99)

## 2024-02-26 SURGERY — CYSTOURETEROSCOPY, WITH RETROGRADE PYELOGRAM AND STENT INSERTION
Anesthesia: General | Site: Ureter | Laterality: Right

## 2024-02-26 MED ORDER — CEFAZOLIN SODIUM-DEXTROSE 2-4 GM/100ML-% IV SOLN
2.0000 g | INTRAVENOUS | Status: AC
  Administered 2024-02-26: 2 g via INTRAVENOUS

## 2024-02-26 MED ORDER — CEFAZOLIN SODIUM-DEXTROSE 2-4 GM/100ML-% IV SOLN
INTRAVENOUS | Status: AC
  Filled 2024-02-26: qty 100

## 2024-02-26 MED ORDER — OXYCODONE HCL 5 MG PO TABS: 5.0000 mg | ORAL_TABLET | Freq: Once | ORAL | Status: DC | PRN

## 2024-02-26 MED ORDER — ONDANSETRON HCL 4 MG/2ML IJ SOLN
INTRAMUSCULAR | Status: AC
  Filled 2024-02-26: qty 2

## 2024-02-26 MED ORDER — FENTANYL CITRATE (PF) 100 MCG/2ML IJ SOLN
INTRAMUSCULAR | Status: AC
  Filled 2024-02-26: qty 2

## 2024-02-26 MED ORDER — FENTANYL CITRATE PF 50 MCG/ML IJ SOSY: 25.0000 ug | PREFILLED_SYRINGE | INTRAMUSCULAR | Status: DC | PRN

## 2024-02-26 MED ORDER — CHLORHEXIDINE GLUCONATE 0.12 % MT SOLN: 15.0000 mL | Freq: Once | OROMUCOSAL | Status: AC

## 2024-02-26 MED ORDER — PROPOFOL 10 MG/ML IV BOLUS
INTRAVENOUS | Status: AC
  Filled 2024-02-26: qty 20

## 2024-02-26 MED ORDER — LIDOCAINE HCL (CARDIAC) PF 100 MG/5ML IV SOSY
PREFILLED_SYRINGE | INTRAVENOUS | Status: DC | PRN
  Administered 2024-02-26: 40 mg via INTRAVENOUS

## 2024-02-26 MED ORDER — DIATRIZOATE MEGLUMINE 30 % UR SOLN
URETHRAL | Status: AC
Start: 2024-02-26 — End: 2024-02-26
  Filled 2024-02-26: qty 100

## 2024-02-26 MED ORDER — SODIUM CHLORIDE 0.9 % IR SOLN
Status: DC | PRN
  Administered 2024-02-26: 3000 mL

## 2024-02-26 MED ORDER — PROPOFOL 10 MG/ML IV BOLUS
INTRAVENOUS | Status: DC | PRN
  Administered 2024-02-26: 100 mg via INTRAVENOUS

## 2024-02-26 MED ORDER — CHLORHEXIDINE GLUCONATE 0.12 % MT SOLN: 15.0000 mL | Freq: Once | OROMUCOSAL | Status: DC

## 2024-02-26 MED ORDER — EPHEDRINE 5 MG/ML INJ
INTRAVENOUS | Status: AC
Start: 2024-02-26 — End: 2024-02-26
  Filled 2024-02-26: qty 5

## 2024-02-26 MED ORDER — HYDROCODONE-ACETAMINOPHEN 10-325 MG PO TABS: 1.0000 | ORAL_TABLET | Freq: Four times a day (QID) | ORAL | 0 refills | Status: DC | PRN

## 2024-02-26 MED ORDER — CHLORHEXIDINE GLUCONATE 0.12 % MT SOLN
OROMUCOSAL | Status: AC
  Administered 2024-02-26: 15 mL
  Filled 2024-02-26: qty 15

## 2024-02-26 MED ORDER — ORAL CARE MOUTH RINSE: 15.0000 mL | Freq: Once | OROMUCOSAL | Status: DC

## 2024-02-26 MED ORDER — DIATRIZOATE MEGLUMINE 30 % UR SOLN
URETHRAL | Status: DC | PRN
  Administered 2024-02-26: 5 mL via URETHRAL
  Administered 2024-02-26: 10 mL via URETHRAL

## 2024-02-26 MED ORDER — LACTATED RINGERS IV SOLN: INTRAVENOUS | Status: DC | PRN

## 2024-02-26 MED ORDER — ONDANSETRON HCL 4 MG/2ML IJ SOLN
INTRAMUSCULAR | Status: DC | PRN
  Administered 2024-02-26: 4 mg via INTRAVENOUS

## 2024-02-26 MED ORDER — EPHEDRINE SULFATE (PRESSORS) 50 MG/ML IJ SOLN
INTRAMUSCULAR | Status: DC | PRN
  Administered 2024-02-26: 10 mg via INTRAVENOUS

## 2024-02-26 MED ORDER — WATER FOR IRRIGATION, STERILE IR SOLN
Status: DC | PRN
  Administered 2024-02-26: 500 mL

## 2024-02-26 MED ORDER — LACTATED RINGERS IV SOLN: INTRAVENOUS | Status: DC

## 2024-02-26 MED ORDER — FENTANYL CITRATE (PF) 100 MCG/2ML IJ SOLN
INTRAMUSCULAR | Status: DC | PRN
  Administered 2024-02-26 (×2): 25 ug via INTRAVENOUS

## 2024-02-26 MED ORDER — ONDANSETRON HCL 4 MG/2ML IJ SOLN: 4.0000 mg | Freq: Once | INTRAMUSCULAR | Status: DC | PRN

## 2024-02-26 MED ORDER — LIDOCAINE 2% (20 MG/ML) 5 ML SYRINGE
INTRAMUSCULAR | Status: AC
  Filled 2024-02-26: qty 5

## 2024-02-26 MED ORDER — OXYCODONE HCL 5 MG/5ML PO SOLN: 5.0000 mg | Freq: Once | ORAL | Status: DC | PRN

## 2024-02-26 SURGICAL SUPPLY — 21 items
BAG DRAIN URO TABLE W/ADPT NS (BAG) ×1 IMPLANT
DRSG TELFA 3X8 NADH STRL (GAUZE/BANDAGES/DRESSINGS) IMPLANT
FORCEPS BIOP PIRANHA Y (CUTTING FORCEPS) IMPLANT
GLOVE BIO SURGEON STRL SZ8 (GLOVE) ×1 IMPLANT
GLOVE BIOGEL PI IND STRL 7.0 (GLOVE) ×2 IMPLANT
GOWN STRL REUS W/TWL LRG LVL3 (GOWN DISPOSABLE) ×1 IMPLANT
GOWN STRL REUS W/TWL XL LVL3 (GOWN DISPOSABLE) ×1 IMPLANT
GUIDEWIRE STR DUAL SENSOR (WIRE) ×1 IMPLANT
GUIDEWIRE STR ZIPWIRE 035X150 (MISCELLANEOUS) ×1 IMPLANT
KIT TURNOVER CYSTO (KITS) ×1 IMPLANT
MANIFOLD NEPTUNE II (INSTRUMENTS) ×1 IMPLANT
NDL HYPO 18GX1.5 BLUNT FILL (NEEDLE) IMPLANT
NEEDLE HYPO 18GX1.5 BLUNT FILL (NEEDLE) ×1 IMPLANT
PACK CYSTO (CUSTOM PROCEDURE TRAY) ×1 IMPLANT
PAD ARMBOARD POSITIONER FOAM (MISCELLANEOUS) ×1 IMPLANT
POSITIONER HEAD 8X9X4 ADT (SOFTGOODS) ×1 IMPLANT
SOL .9 NS 3000ML IRR UROMATIC (IV SOLUTION) ×2 IMPLANT
SYR 10ML LL (SYRINGE) ×1 IMPLANT
SYR CONTROL 10ML LL (SYRINGE) ×1 IMPLANT
TOWEL OR 17X26 4PK STRL BLUE (TOWEL DISPOSABLE) ×1 IMPLANT
WATER STERILE IRR 500ML POUR (IV SOLUTION) ×1 IMPLANT

## 2024-02-26 NOTE — Anesthesia Preprocedure Evaluation (Signed)
 Anesthesia Evaluation  Patient identified by MRN, date of birth, ID band Patient awake    Reviewed: Allergy & Precautions, H&P , NPO status , Patient's Chart, lab work & pertinent test results, reviewed documented beta blocker date and time   Airway Mallampati: II  TM Distance: >3 FB Neck ROM: full    Dental no notable dental hx.    Pulmonary neg pulmonary ROS, former smoker   Pulmonary exam normal breath sounds clear to auscultation       Cardiovascular Exercise Tolerance: Good hypertension, + Past MI and +CHF  + dysrhythmias  Rhythm:regular Rate:Normal     Neuro/Psych negative neurological ROS  negative psych ROS   GI/Hepatic Neg liver ROS,GERD  ,,  Endo/Other  diabetes    Renal/GU Renal disease  negative genitourinary   Musculoskeletal   Abdominal   Peds  Hematology negative hematology ROS (+)   Anesthesia Other Findings   Reproductive/Obstetrics negative OB ROS                              Anesthesia Physical Anesthesia Plan  ASA: 3  Anesthesia Plan: General and General LMA   Post-op Pain Management:    Induction:   PONV Risk Score and Plan: Ondansetron   Airway Management Planned:   Additional Equipment:   Intra-op Plan:   Post-operative Plan:   Informed Consent: I have reviewed the patients History and Physical, chart, labs and discussed the procedure including the risks, benefits and alternatives for the proposed anesthesia with the patient or authorized representative who has indicated his/her understanding and acceptance.     Dental Advisory Given  Plan Discussed with: CRNA  Anesthesia Plan Comments:         Anesthesia Quick Evaluation

## 2024-02-26 NOTE — Anesthesia Postprocedure Evaluation (Signed)
 Anesthesia Post Note  Patient: Erika Lee  Procedure(s) Performed: DIAGNOSTIC URETEROSCOPY, BIOPSY WITH RETROGRADE PYELOGRAM. (Right: Ureter)  Patient location during evaluation: PACU Anesthesia Type: General Level of consciousness: awake, oriented and awake and alert Pain management: pain level controlled Vital Signs Assessment: post-procedure vital signs reviewed and stable Respiratory status: spontaneous breathing, nonlabored ventilation and respiratory function stable Cardiovascular status: blood pressure returned to baseline and stable Anesthetic complications: no   No notable events documented.   Last Vitals:  Vitals:   02/26/24 0738  BP: 136/66  Pulse: (!) 50  Resp: 20  Temp: 37.1 C  SpO2: 99%    Last Pain:  Vitals:   02/26/24 0738  TempSrc: Oral  PainSc: 0-No pain                 Adine LITTIE Anger

## 2024-02-26 NOTE — Addendum Note (Signed)
 Addendum  created 02/26/24 1027 by Pheobe Adine CROME, CRNA   LDA properties accepted

## 2024-02-26 NOTE — Op Note (Signed)
 Preoperative diagnosis: Right hydronephrosis  Postoperative diagnosis: Same  Procedure: 1 cystoscopy 2.  right retrograde pyelography 3.  Intraoperative fluoroscopy, under one hour, with interpretation 4.  Right diagnostic ureteroscopy 5. Right ureteral biopsy  Attending: Belvie Standing  Anesthesia: General  Estimated blood loss: None  Drains: none  Specimens: right ureteral mass x3  Antibiotics: ancef   Findings: complete obliteration of the mid ureter with no contrast seen above the stricture. Sessile erythematous mass in the mid ureter.  Indications: Patient is a 78 year old female with a history of hydronephrosis.  After discussing treatment options, she decided proceed with right diagnostic ureteroscopy  Procedure in detail: The patient was brought to the operating room and a brief timeout was done to ensure correct patient, correct procedure, correct site.  General anesthesia was administered patient was placed in dorsal lithotomy position.  Her genitalia was then prepped and draped in usual sterile fashion.  A rigid 22 French cystoscope was passed in the urethra and the bladder.  Bladder was inspected free masses or lesions.  the right ureteral orifices were in the normal orthotopic locations.  a 6 french ureteral catheter was then instilled into the right ureter orifice.  a gentle retrograde was obtained and findings noted above.  we then placed a zip wire through the ureteral catheter and advanced up to the mid ureter but were unable to advance the wire past the stricture.  we then removed the cystoscope and cannulated the right ureteral orifice with a semirigid ureteroscope.  we then performed ureteroscopy up to the level of the mid ureter where we noted complete obliteration of the ureter. We noted a sessile mass which was biopsied with the piranha.  the bladder was then drained and this concluded the procedure which was well tolerated by patient.  Complications:  None  Condition: Stable, extubated, transferred to PACU  Plan: Pt is to followup in 2 weeks for a pathology discussion

## 2024-02-26 NOTE — Interval H&P Note (Signed)
 History and Physical Interval Note:  02/26/2024 8:45 AM  Erika Lee  has presented today for surgery, with the diagnosis of hydronephrosis of right kidney.  The various methods of treatment have been discussed with the patient and family. After consideration of risks, benefits and other options for treatment, the patient has consented to  Procedure(s) with comments: CYSTOURETEROSCOPY, WITH RETROGRADE PYELOGRAM AND STENT INSERTION (Right) - possible ureteral biopsy and possible dilation of ureteral stricture as a surgical intervention.  The patient's history has been reviewed, patient examined, no change in status, stable for surgery.  I have reviewed the patient's chart and labs.  Questions were answered to the patient's satisfaction.     [name]

## 2024-02-26 NOTE — Transfer of Care (Signed)
 Immediate Anesthesia Transfer of Care Note  Patient: Erika Lee  Procedure(s) Performed: DIAGNOSTIC URETEROSCOPY, BIOPSY WITH RETROGRADE PYELOGRAM. (Right: Ureter)  Patient Location: PACU  Anesthesia Type:General  Level of Consciousness: awake, alert , and oriented  Airway & Oxygen Therapy: Patient Spontanous Breathing  Post-op Assessment: Report given to RN and Post -op Vital signs reviewed and stable  Post vital signs: Reviewed and stable  Last Vitals:  Vitals Value Taken Time  BP 101/37 02/26/24 09:45  Temp    Pulse 58 02/26/24 09:45  Resp 18 02/26/24 09:45  SpO2 93 % 02/26/24 09:45  Vitals shown include unfiled device data.  Last Pain:  Vitals:   02/26/24 0738  TempSrc: Oral  PainSc: 0-No pain      Patients Stated Pain Goal: 3 (02/26/24 9261)  Complications: No notable events documented.

## 2024-02-27 ENCOUNTER — Telehealth: Payer: Self-pay | Admitting: Urology

## 2024-02-27 LAB — SURGICAL PATHOLOGY

## 2024-02-27 NOTE — Telephone Encounter (Signed)
 Called to let pt know her of her appointment changed to 9/17 @ 2pm w/ MD McKenzie

## 2024-02-27 NOTE — Telephone Encounter (Signed)
 Patient has post op with McKenzie not [employer]

## 2024-03-09 ENCOUNTER — Ambulatory Visit: Admitting: Urology

## 2024-03-11 DIAGNOSIS — M1712 Unilateral primary osteoarthritis, left knee: Secondary | ICD-10-CM | POA: Diagnosis not present

## 2024-03-11 DIAGNOSIS — M1711 Unilateral primary osteoarthritis, right knee: Secondary | ICD-10-CM | POA: Diagnosis not present

## 2024-03-11 DIAGNOSIS — M170 Bilateral primary osteoarthritis of knee: Secondary | ICD-10-CM | POA: Diagnosis not present

## 2024-03-15 ENCOUNTER — Ambulatory Visit (HOSPITAL_COMMUNITY)
Admission: RE | Admit: 2024-03-15 | Discharge: 2024-03-15 | Disposition: A | Discharge status: Home / Self Care | Source: Ambulatory Visit | Attending: Physician Assistant | Admitting: Physician Assistant

## 2024-03-15 DIAGNOSIS — R079 Chest pain, unspecified: Secondary | ICD-10-CM | POA: Insufficient documentation

## 2024-03-15 LAB — ECHOCARDIOGRAM COMPLETE
Area-P 1/2: 3.56 cm2
S' Lateral: 2.6 cm

## 2024-03-17 ENCOUNTER — Ambulatory Visit: Admitting: Urology

## 2024-03-17 VITALS — BP 135/74 | HR 65

## 2024-03-17 DIAGNOSIS — N1330 Unspecified hydronephrosis: Secondary | ICD-10-CM

## 2024-03-17 NOTE — Progress Notes (Unsigned)
 03/17/2024 2:21 PM   Erika Lee 08/23/1945 [id number]  Referring provider: Duanne Butler DASEN, MD [address] [place],  [place] [address]  Right hydronephrosis   HPI: [name] is a 78yo here for followup for right hydronephrosis. She underwent right diagnostic ureteroscopy which revealed a dense ureteral stricture and we were unable to place a ureteral stent   PMH: Past Medical History:  Diagnosis Date   Anticoagulant long-term use    eliquis    Chronic kidney disease    Chronic sinusitis    Dysrhythmia    A.fib,   GERD (gastroesophageal reflux disease)    Heart failure with preserved ejection fraction (HCC)    Hemorrhoids    History of colitis 10/2016   campylobacter   Hyperlipidemia    Hypertension    Multinodular goiter    per pt has had 2 biopsy's both benign   NSTEMI (non-ST elevated myocardial infarction) (HCC)    12/2018- normal coronary arteries on cath, poss coronary vasospasm.    Osteoarthritis    all over  and [facility] joint left shoulder   Paroxysmal atrial fibrillation [facility]) EP cardiologist--  dr kelsie   s/p PVI by Dr kelsie 07/16/2013;   pt first dx 2008,  recurrence 2014 afib/flutter and tachy-brady   Rotator cuff tear, left    Shoulder impingement, left    Status post placement of implantable loop recorder    original placement 07-26-2014;  removal / replacement 10-17-2017  by dr allred   Type 2 diabetes mellitus (HCC)    followed by pcp    Surgical History: Past Surgical History:  Procedure Laterality Date   ATRIAL FIBRILLATION ABLATION N/A 07/16/2013   PVI and CTI ablation by Dr kelsie   CARDIAC CATHETERIZATION     01/11/2019- Normal coronary arteries.    COLONOSCOPY  09/10/2011   Dr. Mavis: normal, 10 year follow up.   CYSTOSCOPY W/ URETERAL STENT PLACEMENT Right 10/22/2023   Procedure: CYSTOSCOPY, WITH RETROGRADE PYELOGRAM, RIGHT URETEROSCOPY, PERFORMANCE OF RIGHT RENAL WASHINGS AND URETERAL STENT INSERTION;  Surgeon: Matilda Senior, MD;  Location: [facility];  Service: Urology;  Laterality: Right;   CYSTOSCOPY WITH RETROGRADE PYELOGRAM, URETEROSCOPY AND STENT PLACEMENT Right 02/26/2024   Procedure: DIAGNOSTIC URETEROSCOPY, RETROGRADE PYELOGRAM.;  Surgeon: Sherrilee Belvie CROME, MD;  Location: [facility];  Service: Urology;  Laterality: Right;   implantable loop recorder removal  04/25/2021   MDT LINQ removed by Dr kelsie   LEFT HEART CATH AND CORONARY ANGIOGRAPHY N/A 01/11/2019   Procedure: LEFT HEART CATH AND CORONARY ANGIOGRAPHY;  Surgeon: Court Dorn PARAS, MD;  Location: [facility];  Service: Cardiovascular;  Laterality: N/A;   LESION REMOVAL N/A 05/03/2013   Procedure: EXCISION CYST, BACK;  Surgeon: Oneil DELENA Mavis, MD;  Location: [facility];  Service: General;  Laterality: N/A;   LOOP RECORDER IMPLANT N/A 07/26/2014   Procedure: LOOP RECORDER IMPLANT;  Surgeon: Lynwood kelsie, MD;  Location: [facility] CATH LAB;  Service: Cardiovascular;  Laterality: N/A;   LOOP RECORDER INSERTION N/A 10/17/2017   MDT previously implanted ILR for afib management at [facility].  old device removed and new device placed by Dr kelsie   LOOP RECORDER REMOVAL N/A 10/17/2017   MDT ILR removed with new device subsequently replaced   SHOULDER ARTHROSCOPY WITH ROTATOR CUFF REPAIR AND SUBACROMIAL DECOMPRESSION Left 01/07/2019   Procedure: Left shoulder subacromial decompression, distal clavicle resection, extensive debridement,;  Surgeon: Sharl Selinda Belvie, MD;  Location: [facility];  Service: Orthopedics;  Laterality: Left;   TEE WITHOUT CARDIOVERSION N/A 07/15/2013   Procedure: TRANSESOPHAGEAL ECHOCARDIOGRAM (TEE);  Surgeon: Redell GORMAN Shallow, MD;  Location: [facility] ENDOSCOPY;  Service: Cardiovascular;  Laterality: N/A;   TONSILLECTOMY  age 83   TRANSANAL HEMORRHOIDAL DEARTERIALIZATION N/A 09/22/2015   Procedure: TRANSANAL HEMORRHOIDAL LIGATION/PEXY EUA POSSIBLE HEMORRHOIDECTOMY ;  Surgeon: Elspeth Schultze, MD;  Location: [facility];  Service:  General;  Laterality: N/A;   URETERAL BIOPSY Right 02/26/2024   Procedure: BIOPSY, URETER;  Surgeon: Sherrilee Belvie CROME, MD;  Location: [facility];  Service: Urology;  Laterality: Right;    Home Medications:  Allergies as of 03/17/2024       Reactions   Clindamycin /lincomycin Rash   Doxycycline Other (See Comments)   Makes heart race   Keflex  [cephalexin ] Nausea And Vomiting   Sulfonic Acid (3,5-dibromo-4-h-ox-benz) Other (See Comments)   Unknown    Adhesive [tape] Rash   Latex Rash   Sulfonamide Derivatives Nausea And Vomiting        Medication List        Accurate as of March 17, 2024  2:21 PM. If you have any questions, ask your nurse or doctor.          amLODipine  5 MG tablet Commonly known as: NORVASC  Take 1 tablet (5 mg total) by mouth daily.   apixaban  5 MG Tabs tablet Commonly known as: Eliquis  Take 1 tablet (5 mg total) by mouth 2 (two) times daily.   azelastine  0.1 % nasal spray Commonly known as: ASTELIN  Place 1 spray into both nostrils 2 (two) times daily. Use in each nostril as directed- helps with postnasal drip   diazepam  5 MG tablet Commonly known as: VALIUM  TAKE 1 TABLET(5 MG) BY MOUTH AT BEDTIME AS NEEDED FOR SLEEP   estradiol  0.1 MG/GM vaginal cream Commonly known as: ESTRACE  Place 1 Applicatorful vaginally at bedtime.   freestyle lancets Use to check blood sugar daily.   FreeStyle Precision Neo Test test strip Generic drug: glucose blood DX: E11.9 Use to check blood sugar daily   HYDROcodone -acetaminophen  10-325 MG tablet Commonly known as: NORCO Take 1 tablet by mouth every 6 (six) hours as needed.   lisinopril  40 MG tablet Commonly known as: ZESTRIL  Take 1 tablet (40 mg total) by mouth daily.   mometasone  0.1 % cream Commonly known as: ELOCON  Apply topically daily.   ondansetron  4 MG tablet Commonly known as: Zofran  Take 1 tablet (4 mg total) by mouth every 8 (eight) hours as needed for nausea or vomiting.   pantoprazole   40 MG tablet Commonly known as: PROTONIX  Take 1 tablet (40 mg total) by mouth daily.   predniSONE  20 MG tablet Commonly known as: DELTASONE  3 tabs poqday 1-2, 2 tabs poqday 3-4, 1 tab poqday 5-6   promethazine  12.5 MG tablet Commonly known as: PHENERGAN  Take 1 tablet (12.5 mg total) by mouth every 6 (six) hours as needed for nausea or vomiting.   simvastatin  40 MG tablet Commonly known as: ZOCOR  Take 1 tablet (40 mg total) by mouth daily at 6 PM.   spironolactone  25 MG tablet Commonly known as: ALDACTONE  Take 0.5 tablets (12.5 mg total) by mouth daily.   tamsulosin 0.4 MG Caps capsule Commonly known as: FLOMAX Take by mouth.        Allergies:  Allergies  Allergen Reactions   Clindamycin /Lincomycin Rash   Doxycycline Other (See Comments)    Makes heart race   Keflex  [Cephalexin ] Nausea And Vomiting   Sulfonic Acid (3,5-Dibromo-4-H-Ox-Benz) Other (See Comments)  Unknown    Adhesive [Tape] Rash   Latex Rash   Sulfonamide Derivatives Nausea And Vomiting    Family History: Family History  Problem Relation Age of Onset   Stroke Mother 83   Stroke Maternal Grandmother        stroke   Other Maternal Grandfather        heart dropsy kidney issues   Kidney disease Maternal Grandfather    Colon cancer Neg Hx     Social History:  reports that she quit smoking about 30 years ago. Her smoking use included cigarettes. She started smoking about 64 years ago. She has been exposed to tobacco smoke. She has never used smokeless tobacco. She reports that she does not drink alcohol and does not use drugs.  ROS: All other review of systems were reviewed and are negative except what is noted above in HPI  Physical Exam: BP 135/74   Pulse 65   LMP  (LMP Unknown)   Constitutional:  Alert and oriented, No acute distress. HEENT: [place] AT, moist mucus membranes.  Trachea midline, no masses. Cardiovascular: No clubbing, cyanosis, or edema. Respiratory: Normal respiratory effort,  no increased work of breathing. GI: Abdomen is soft, nontender, nondistended, no abdominal masses GU: No CVA tenderness.  Lymph: No cervical or inguinal lymphadenopathy. Skin: No rashes, bruises or suspicious lesions. Neurologic: Grossly intact, no focal deficits, moving all 4 extremities. Psychiatric: Normal mood and affect.  Laboratory Data: Lab Results  Component Value Date   WBC 6.2 01/27/2024   HGB 12.9 01/27/2024   HCT 38.8 01/27/2024   MCV 90.2 01/27/2024   PLT 185 01/27/2024    Lab Results  Component Value Date   CREATININE 1.29 (H) 01/27/2024    No results found for: PSA  No results found for: TESTOSTERONE  Lab Results  Component Value Date   HGBA1C 6.0 (H) 01/22/2024    Urinalysis    Component Value Date/Time   COLORURINE YELLOW 01/05/2024 1112   APPEARANCEUR Clear 02/23/2024 1458   LABSPEC 1.010 01/05/2024 1112   PHURINE 6.5 01/05/2024 1112   GLUCOSEU Negative 02/23/2024 1458   HGBUR NEGATIVE 01/05/2024 1112   BILIRUBINUR Negative 02/23/2024 1458   KETONESUR NEGATIVE 01/05/2024 1112   PROTEINUR Negative 02/23/2024 1458   PROTEINUR NEGATIVE 01/05/2024 1112   UROBILINOGEN 0.2 08/16/2014 1958   NITRITE Negative 02/23/2024 1458   NITRITE NEGATIVE 01/05/2024 1112   LEUKOCYTESUR Negative 02/23/2024 1458   LEUKOCYTESUR 2+ (A) 01/05/2024 1112    Lab Results  Component Value Date   LABMICR Comment 02/23/2024   WBCUA 6-10 (A) 11/18/2023   LABEPIT 0-10 11/18/2023   BACTERIA FEW (A) 01/05/2024    Pertinent Imaging: *** No results found for this or any previous visit.  No results found for this or any previous visit.  No results found for this or any previous visit.  No results found for this or any previous visit.  Results for orders placed during the hospital encounter of 11/28/23  US  RENAL  Narrative CLINICAL DATA:  Hydronephrosis of right kidney  EXAM: RENAL / URINARY TRACT ULTRASOUND COMPLETE  COMPARISON:  10/22/2023  CT  FINDINGS: Right Kidney:  Renal measurements: 10.1 x 4.8 x 5.2 cm = volume: 132 mL. Normal echotexture. Moderate right hydronephrosis. No mass.  Left Kidney:  Renal measurements: 9.8 x 5.8 x 4.9 cm = volume: 145 mL. Echogenicity within normal limits. No mass or hydronephrosis visualized.  Bladder:  Appears normal for degree of bladder distention.  Other:  None.  IMPRESSION:  Moderate right hydronephrosis.   Electronically Signed By: Franky Crease M.D. On: 12/06/2023 22:24  No results found for this or any previous visit.  Results for orders placed during the hospital encounter of 09/30/23  CT HEMATURIA WORKUP  Narrative CLINICAL DATA:  Unintentional weight loss and gross hematuria  EXAM: CT CHEST WITH CONTRAST  CT ABDOMEN AND PELVIS WITH AND WITHOUT CONTRAST  TECHNIQUE: Multidetector CT imaging of the chest was performed during intravenous contrast administration. Multidetector CT imaging of the abdomen and pelvis was performed following the standard protocol before and during bolus administration of intravenous contrast.  RADIATION DOSE REDUCTION: This exam was performed according to the departmental dose-optimization program which includes automated exposure control, adjustment of the mA and/or kV according to patient size and/or use of iterative reconstruction technique.  CONTRAST:  OMNIPAQUE  IOHEXOL  350 MG/ML SOLN  COMPARISON:  CT abdomen and pelvis dated 08/14/2023, CTA chest dated 01/09/2019  FINDINGS: CT CHEST FINDINGS  Cardiovascular: Multichamber cardiomegaly. No significant pericardial fluid/thickening. Great vessels are normal in course and caliber. No central pulmonary emboli. Coronary artery calcifications.  Mediastinum/Nodes: Enlarged, heterogeneous thyroid  gland, previously evaluated. Normal esophagus. No pathologically enlarged axillary, supraclavicular, mediastinal, or hilar lymph nodes.  Lungs/Pleura: The central airways  are patent. No suspicious pulmonary nodules. No focal consolidation. No pneumothorax. No pleural effusion.  Musculoskeletal: No acute or abnormal lytic or blastic osseous lesions. Multilevel degenerative changes of the thoracic spine.  CT ABDOMEN PELVIS FINDINGS  Hepatobiliary: Subcentimeter segment 5 hypodensity (9:21), too small to characterize. No intra or extrahepatic biliary ductal dilation. Cholelithiasis.  Pancreas: No focal lesions or main ductal dilation.  Spleen: Normal in size without focal abnormality.  Adrenals/Urinary Tract: No adrenal nodules. Similar mild-to-moderate right hydroureteronephrosis to the level of the distal right ureter, where there is abrupt cut off and enhancing soft tissue density (12:46, 13:65). Delayed right nephrogram. No suspicious renal masses or calculi. No suspicious filling defect visualized within the opacified portions of the left renal collecting system or ureter on delayed imaging. No focal bladder wall thickening.  Stomach/Bowel: Normal appearance of the stomach. No evidence of bowel wall thickening or inflammatory changes. Dilated rectum contains large volume stool. Moderate to large volume stool within the remainder of the colon. Appendix is not discretely seen.  Vascular/Lymphatic: Aortic atherosclerosis. No enlarged abdominal or pelvic lymph nodes.  Reproductive: Calcified uterine leiomyoma.  No adnexal masses.  Other: No free fluid, fluid collection, or free air.  Musculoskeletal: No acute or abnormal lytic or blastic osseous findings. Multilevel degenerative changes of the lumbar spine.  IMPRESSION: CT CHEST:  1. No acute intrathoracic abnormality. 2. Multichamber cardiomegaly. 3. Coronary artery calcifications. Assessment for potential risk factor modification, dietary therapy or pharmacologic therapy may be warranted, if clinically indicated.  CT ABDOMEN AND PELVIS:  1. Similar mild-to-moderate right  hydroureteronephrosis to the level of the distal right ureter, where there is abrupt cut off and enhancing soft tissue density, suspicious for urothelial neoplasm. Recommend further evaluation with cystoscopy. 2. Dilated rectum contains large volume stool. Moderate to large volume stool within the remainder of the colon. Recommend correlation with constipation. 3. Cholelithiasis.  Aortic Atherosclerosis (ICD10-I70.0).   Electronically Signed By: Limin  Xu M.D. On: 10/01/2023 16:21  Results for orders placed during the hospital encounter of 08/14/23  CT RENAL STONE STUDY  Narrative CLINICAL DATA:  Hematuria  EXAM: CT ABDOMEN AND PELVIS WITHOUT CONTRAST  TECHNIQUE: Multidetector CT imaging of the abdomen and pelvis was performed following the standard protocol without IV contrast.  RADIATION DOSE REDUCTION: This exam was performed according to the departmental dose-optimization program which includes automated exposure control, adjustment of the mA and/or kV according to patient size and/or use of iterative reconstruction technique.  COMPARISON:  None Available.  FINDINGS: Lower chest: Bases clear.  No pericardial or pleural effusions.  Hepatobiliary: No hepatic parenchymal abnormalities. Gallbladder appears distended with small calcified stones or milk of calcium . No pericholecystic inflammatory changes or fluid.  Pancreas: Unremarkable. No pancreatic ductal dilatation or surrounding inflammatory changes.  Spleen: Normal in size without focal abnormality.  Adrenals/Urinary Tract: No adrenal lesions. No intrarenal stones. Right-sided hydronephrosis and hydroureter. No ureteral stones. Unremarkable urinary bladder.  Stomach/Bowel: Stomach is within normal limits. Appendix appears normal. No evidence of bowel wall thickening, distention, or inflammatory changes.  Vascular/Lymphatic: Aortic atherosclerosis. No enlarged abdominal or pelvic lymph  nodes.  Reproductive: Calcified fibroid.  Other: No abdominal wall hernia or abnormality. No abdominopelvic ascites.  Musculoskeletal: Thoracolumbosacral degenerative changes.  IMPRESSION: 1. Right-sided hydronephrosis and hydroureter. No obstructing stones identified. 2. Distended gallbladder with calcified stones or milk of calcium . 3. Calcified fibroid. 4. Aortic atherosclerosis.   Electronically Signed By: Fonda Field M.D. On: 08/30/2023 19:10   Assessment & Plan:    1. Hydronephrosis of right kidney (Primary) Referral to IR for right nephrostomy tube/right nephroureteral stent placement - Urinalysis, Routine w reflex microscopic   No follow-ups on file.  [name], MD  [facility] [place]

## 2024-03-18 ENCOUNTER — Encounter: Payer: Self-pay | Admitting: Urology

## 2024-03-18 LAB — URINALYSIS, ROUTINE W REFLEX MICROSCOPIC
Bilirubin, UA: NEGATIVE
Glucose, UA: NEGATIVE
Ketones, UA: NEGATIVE
Nitrite, UA: NEGATIVE
Protein,UA: NEGATIVE
RBC, UA: NEGATIVE
Specific Gravity, UA: <1.005 — ABNORMAL LOW (ref 1.005–1.030)
Urobilinogen, Ur: 0.2 mg/dL (ref 0.2–1.0)
pH, UA: 6 (ref 5.0–7.5)

## 2024-03-18 LAB — MICROSCOPIC EXAMINATION: Bacteria, UA: NONE SEEN

## 2024-03-18 NOTE — Patient Instructions (Signed)
 Hydronephrosis  Hydronephrosis is the swelling of one or both kidneys due to a blockage that stops urine from flowing out of the body. Kidneys filter waste from the blood and produce urine. This condition can lead to kidney failure and may become life-threatening if not treated promptly. What are the causes? In infants and children, common causes include problems that occur when a baby is developing in the womb. These can include problems in the kidneys or in the tubes that drain urine into the bladder (ureters). In adults, common causes include: Kidney stones. Pregnancy. A tumor or cyst in the abdomen or pelvis. An enlarged prostate gland. Other causes include: Bladder infection. Scar tissue from a previous surgery or injury. A blood clot. Cancer of the prostate, bladder, uterus, ovary, or colon. What are the signs or symptoms? Symptoms of this condition include: Pain or discomfort in your side (flank) or abdomen. Swelling in your abdomen. Nausea and vomiting. Fever. Pain when passing urine. Feelings of urgency when you need to urinate. Urinating more often than normal. In some cases, you may not have any symptoms. How is this diagnosed? This condition may be diagnosed based on: Your symptoms and medical history. A physical exam. Blood and urine tests. Imaging tests, such as an ultrasound, CT scan, or MRI. A procedure to look at your urinary tract and bladder by inserting a scope into the urethra (cystoscopy). How is this treated? Treatment for this condition depends on where the blockage is, how long it has been there, and what caused it. The goal of treatment is to remove the blockage. Treatment may include: Antibiotic medicines to treat or prevent infection. A procedure to place a small, thin tube (stent) into a blocked ureter. The stent will keep the ureter open so that urine can drain through it. A nonsurgical procedure that crushes kidney stones with shock waves  (extracorporeal shock wave lithotripsy). If kidney failure occurs, treatment may include dialysis or a kidney transplant. Follow these instructions at home:  Take over-the-counter and prescription medicines only as told by your health care provider. If you were prescribed an antibiotic medicine, take it exactly as told by your health care provider. Do not stop taking the antibiotic even if you start to feel better. Rest and return to your normal activities as told by your health care provider. Ask your health care provider what activities are safe for you. Drink enough fluid to keep your urine pale yellow. Keep all follow-up visits. This is important. Contact a health care provider if: You continue to have symptoms after treatment. You develop new symptoms. Your urine becomes cloudy or bloody. You have a fever. Get help right away if: You have severe flank or abdominal pain. You cannot drink fluids without vomiting. Summary Hydronephrosis is the swelling of one or both kidneys due to a blockage that stops urine from flowing out of the body. Hydronephrosis can lead to kidney failure and may become life-threatening if not treated promptly. The goal of treatment is to remove the blockage. It may include a procedure to insert a stent into a blocked ureter, a procedure to break up kidney stones, or taking antibiotic medicines. Follow your health care provider's instructions for taking care of yourself at home, including instructions about drinking fluids, taking medicines, and limiting activities. This information is not intended to replace advice given to you by your health care provider. Make sure you discuss any questions you have with your health care provider. Document Revised: 03/18/2023 Document Reviewed: 03/18/2023 Elsevier  Patient Education  2024 [employer].

## 2024-03-21 NOTE — Progress Notes (Unsigned)
 Cardiology Office Note Date:  03/21/2024  Patient ID:  Erika Lee, Erika Lee 05-Sep-1945, MRN [id number] PCP:  Duanne Butler DASEN, MD  Electrophysiologist:  Dr. Kelsie >> Dr. Cindie   Chief Complaint:     post ER  History of Present Illness: Erika Lee is a 78 y.o. female with history of  DM, GERD, HTN,  AFlutter/AFib (s/p CTI and PVI ablation 2015),  July 2020 suffered NSTEMI > echo noted preserved LVEF with new WMA > cath with normal coronaries, possibly had spasm. RBBB  LVEF 65-70%, no WMA, RV OK, , no sign VHD  She saw Dr. Cindie 08/07/22, fluctuation in HR/rhythm, unable to tell if/when she is in AFib, compliant with Eliquis .  Mentioned on Jardiance , off metoprolol .  No changes were made.  Saw Dr. Duanne 05/05/23, caring for her husband was weighing on her, tired. + edema, no SOB/nocturnal symptoms Planned for labs HR was 46 BP 130/72   I saw her 06/02/23 She continues to be the caregiver for her husband now over a year post stroke, requiring total care. She denies CP, palpitations or cardiac awareness No dizzy spells, no near syncope or syncope. She does get SOB with heavier activities, gives an example of bring the trash cans to the road, or carrying groceries , not with usual ADLs or care for her husband. No bleeding or signs of bleeding Her observation of HRs at home 40's-50's when she checks EKG was SB 48, RBBB Ambulated in the office HR >> 68-73bpm, O2 sats 95-97%, she did report feeling a bit SOB, did not have to stop Planned for a monitor did not suspect she needed a pacer in the short term at least On no nodal blocking agents  Monitor looked OK.  HRs were OK with average rate 56bpm  No AFib  I saw her 08/04/23 She reports that in d/w Hospice team, suspect her husband had started the dying process Not eating much, she continues to be the primary caregiver for him She hopes the process will be painless and peacful for him, very hard to watch. No CP,  palpitations Her back bothers her, hoping to get an injection again soon. No near syncope or syncope She is tired The same DOE, not escalating, the same examples of bring the trash cans to the road, or carrying groceries get her winded.  No SOB DOE otherwise Not felt to be overtly volume OL No changes were made Planned for 4 mo f/u  ER visit 10/01/23: feeling weak/jittery, recent passing of her husband, not eating as usual, outpt w/u for weight loss in progress, pending EGD/colonoscopy, mammogram and cystoscopy for some hematuria  UA suspect for UTI, labs evaluation felt to be reassuring, Rx cipro , with plans discharged from the ER w/close PMD followup though she signed out AMA prior to formal d/c  10/22/23: cystoscopy right retrograde ureteropyelogram, fluoroscopic interpretation, right ureteroscopy, performance of right renal washings, placement of 6 French by 24 cm contour double-J stent without tether   Admitted 10/22/23 : post  procedure N/V 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 No dizzy spells, near syncope or syncope She is eating better, drinking more water  She spend much of today's visit reminiscing about her husband. Misses him very much No changes made  ER 01/27/24 CP, SOB K+ 4.2 BUN/Creat 19/1.29 HS Trop 9 WBC 6.2 H/H 12/38 Plts 185 EKG: SB 58, RBBB Not felt to require  admission  I saw her 02/06/24 She has not had ongoing symptoms Reports for about 2-3 days felt like her chest was heavy, felt hard to get a good deep breath in. 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 *** AF burden?   AFib/AAD hx Diagnosed 2008 Flecainide  failed to maintain SR (2015) PVI ablation 07/16/2013  Device information: MDT ILR implanted 10/17/17, AFib surveillance  RRT Removed 04/25/21   Past Medical History:  Diagnosis Date   Anticoagulant long-term use    eliquis    Chronic kidney disease    Chronic sinusitis    Dysrhythmia    A.fib,   GERD (gastroesophageal reflux disease)    Heart failure with preserved ejection fraction (HCC)    Hemorrhoids    History of colitis 10/2016   campylobacter   Hyperlipidemia    Hypertension    Multinodular goiter    per pt has had 2 biopsy's both benign   NSTEMI (non-ST elevated myocardial infarction) (HCC)    12/2018- normal coronary arteries on cath, poss coronary vasospasm.    Osteoarthritis    all over  and [facility] joint left shoulder   Paroxysmal atrial fibrillation [facility]) EP cardiologist--  dr kelsie   s/p PVI by Dr kelsie 07/16/2013;   pt first dx 2008,  recurrence 2014 afib/flutter and tachy-brady   Rotator cuff tear, left    Shoulder impingement, left    Status post placement of implantable loop recorder    original placement 07-26-2014;  removal / replacement 10-17-2017  by dr allred   Type 2  diabetes mellitus (HCC)    followed by pcp    Past Surgical History:  Procedure Laterality Date   ATRIAL FIBRILLATION ABLATION N/A 07/16/2013   PVI and CTI ablation by Dr kelsie   CARDIAC CATHETERIZATION     01/11/2019- Normal coronary arteries.    COLONOSCOPY  09/10/2011   Dr. Mavis: normal, 10 year follow up.   CYSTOSCOPY W/ URETERAL STENT PLACEMENT Right 10/22/2023   Procedure: CYSTOSCOPY, WITH RETROGRADE PYELOGRAM, RIGHT URETEROSCOPY, PERFORMANCE OF RIGHT RENAL WASHINGS AND URETERAL STENT INSERTION;  Surgeon: Matilda Senior, MD;  Location: [facility];  Service: Urology;  Laterality: Right;   CYSTOSCOPY WITH RETROGRADE PYELOGRAM, URETEROSCOPY AND STENT PLACEMENT Right 02/26/2024   Procedure: DIAGNOSTIC URETEROSCOPY, RETROGRADE PYELOGRAM.;  Surgeon: Sherrilee Belvie CROME, MD;  Location: [facility];  Service: Urology;  Laterality: Right;   implantable loop recorder removal  04/25/2021  MDT LINQ removed by Dr Kelsie   LEFT HEART CATH AND CORONARY ANGIOGRAPHY N/A 01/11/2019   Procedure: LEFT HEART CATH AND CORONARY ANGIOGRAPHY;  Surgeon: Court Dorn PARAS, MD;  Location: [facility] IN[facility];  Service: Cardiovascular;  Laterality: N/A;   LESION REMOVAL N/A 05/03/2013   Procedure: EXCISION CYST, BACK;  Surgeon: Oneil DELENA Budge, MD;  Location: [facility];  Service: General;  Laterality: N/A;   LOOP RECORDER IMPLANT N/A 07/26/2014   Procedure: LOOP RECORDER IMPLANT;  Surgeon: Lynwood Kelsie, MD;  Location: [facility] CATH LAB;  Service: Cardiovascular;  Laterality: N/A;   LOOP RECORDER INSERTION N/A 10/17/2017   MDT previously implanted ILR for afib management at RRT.  old device removed and new device placed by Dr Kelsie   LOOP RECORDER REMOVAL N/A 10/17/2017   MDT ILR removed with new device subsequently replaced   SHOULDER ARTHROSCOPY WITH ROTATOR CUFF REPAIR AND SUBACROMIAL DECOMPRESSION Left 01/07/2019   Procedure: Left shoulder subacromial decompression, distal clavicle resection, extensive debridement,;   Surgeon: Sharl Selinda Dover, MD;  Location: [facility];  Service: Orthopedics;  Laterality: Left;   TEE WITHOUT CARDIOVERSION N/A 07/15/2013   Procedure: TRANSESOPHAGEAL ECHOCARDIOGRAM (TEE);  Surgeon: Redell GORMAN Shallow, MD;  Location[facility] ENDOSCOPY;  Service: Cardiovascular;  Laterality: N/A;   TONSILLECTOMY  age 63   TRANSANAL HEMORRHOIDAL DEARTERIALIZATION N/A 09/22/2015   Procedure: TRANSANAL HEMORRHOIDAL LIGATION/PEXY EUA POSSIBLE HEMORRHOIDECTOMY ;  Surgeon: Elspeth Schultze, MD;  Location: [facility];  Service: General;  Laterality: N/A;   URETERAL BIOPSY Right 02/26/2024   Procedure: BIOPSY, URETER;  Surgeon: Sherrilee Dover CROME, MD;  Location: [facility];  Service: Urology;  Laterality: Right;    Current Outpatient Medications  Medication Sig Dispense Refill   amLODipine  (NORVASC ) 5 MG tablet Take 1 tablet (5 mg total) by mouth daily. 90 tablet 3   apixaban  (ELIQUIS ) 5 MG TABS tablet Take 1 tablet (5 mg total) by mouth 2 (two) times daily. 180 tablet 3   azelastine  (ASTELIN ) 0.1 % nasal spray Place 1 spray into both nostrils 2 (two) times daily. Use in each nostril as directed- helps with postnasal drip 30 mL 1   diazepam  (VALIUM ) 5 MG tablet TAKE 1 TABLET(5 MG) BY MOUTH AT BEDTIME AS NEEDED FOR SLEEP 30 tablet 3   estradiol  (ESTRACE ) 0.1 MG/GM vaginal cream Place 1 Applicatorful vaginally at bedtime. 42.5 g 12   glucose blood (FREESTYLE PRECISION NEO TEST) test strip DX: E11.9 Use to check blood sugar daily 100 strip 5   HYDROcodone -acetaminophen  (NORCO) 10-325 MG tablet Take 1 tablet by mouth every 6 (six) hours as needed. 15 tablet 0   Lancets (FREESTYLE) lancets Use to check blood sugar daily. 100 each 5   lisinopril  (ZESTRIL ) 40 MG tablet Take 1 tablet (40 mg total) by mouth daily. 90 tablet 3   mometasone  (ELOCON ) 0.1 % cream Apply topically daily. 15 g 1   ondansetron  (ZOFRAN ) 4 MG tablet Take 1 tablet (4 mg total) by mouth every 8 (eight) hours as needed for nausea or  vomiting. 30 tablet 0   pantoprazole  (PROTONIX ) 40 MG tablet Take 1 tablet (40 mg total) by mouth daily. 90 tablet 3   predniSONE  (DELTASONE ) 20 MG tablet 3 tabs poqday 1-2, 2 tabs poqday 3-4, 1 tab poqday 5-6 12 tablet 0   promethazine  (PHENERGAN ) 12.5 MG tablet Take 1 tablet (12.5 mg total) by mouth every 6 (six) hours as needed for nausea or vomiting. 30 tablet 3   simvastatin  (ZOCOR ) 40 MG tablet  Take 1 tablet (40 mg total) by mouth daily at 6 PM. 90 tablet 2   spironolactone  (ALDACTONE ) 25 MG tablet Take 0.5 tablets (12.5 mg total) by mouth daily. 90 tablet 3   tamsulosin (FLOMAX) 0.4 MG CAPS capsule Take by mouth.     No current facility-administered medications for this visit.    Allergies:   Clindamycin /lincomycin; Doxycycline; Keflex  [cephalexin ]; Sulfonic acid (3,5-dibromo-4-h-ox-benz); Adhesive [tape]; Latex; and Sulfonamide derivatives   Social History:  The patient  reports that she quit smoking about 30 years ago. Her smoking use included cigarettes. She started smoking about 64 years ago. She has been exposed to tobacco smoke. She has never used smokeless tobacco. She reports that she does not drink alcohol and does not use drugs.   Family History:  The patient's family history includes Kidney disease in her maternal grandfather; Other in her maternal grandfather; Stroke in her maternal grandmother; Stroke (age of onset: 47) in her mother.  ROS:  Please see the history of present illness.    All other systems are reviewed and otherwise negative.   PHYSICAL EXAM:  VS:  LMP  (LMP Unknown)  BMI: There is no height or weight on file to calculate BMI. Well nourished, well developed, in no acute distress  HEENT: normocephalic, atraumatic  Neck: no JVD, carotid bruits or masses Cardiac:  RRR; no significant murmurs, no rubs, or gallops Lungs: CTA b/l, no wheezing, rhonchi or rales  Abd: soft, nontender MS: no deformity or atrophy Ext: numerous spider veins, she has some ankle  edema, not particularly pitting Skin: warm and dry, no rash Neuro:  No gross deficits appreciated Psych: euthymic mood, full affect   EKG:  not done today   03/15/24: TTE 1. Left ventricular ejection fraction, by estimation, is 65 to 70%. The  left ventricle has normal function. The left ventricle has no regional  wall motion abnormalities. Left ventricular diastolic parameters were  normal.   2. Right ventricular systolic function is normal. The right ventricular  size is normal. There is normal pulmonary artery systolic pressure. The  estimated right ventricular systolic pressure is 27.5 mmHg.   3. Left atrial size was moderately dilated.   4. The mitral valve is normal in structure. Trivial mitral valve  regurgitation. No evidence of mitral stenosis.   5. The aortic valve is tricuspid. Aortic valve regurgitation is trivial.  No aortic stenosis is present.   6. The inferior vena cava is normal in size with <50% respiratory  variability, suggesting right atrial pressure of 8 mmHg.   Comparison(s): No significant change from prior study.   02/12/24: Coronary CTA IMPRESSION: 1. Coronary calcium  score of 91.2. This was 47th percentile for age-, sex, and race-matched controls.   2. Total plaque volume 143 mm3 which is 26th percentile for age- and sex-matched controls (calcified plaque 14 mm3; non-calcified plaque 129 mm3). TPV is moderate.   3. Normal coronary origin with right dominance.   4. There is minimal (<25%) plaque in the RCA and LAD.  CAD-RADS 1.   5. Aortic atherosclerosis.   RECOMMENDATIONS: CAD-RADS 1: Minimal non-obstructive CAD (0-24%). Consider non-atherosclerotic causes of chest pain. Consider preventive therapy and risk factor modification.  Dec 2024: Monitor HR 40 - 169 bpm, average 56 bpm. 68 SVT, longest 37.9 seconds with an average rate of 116 bpm Occasional supraventricular ectopy, 1.6% Rare ventricular ectopy. No atrial fibrillation.  05/09/22:  TTE 1. Left ventricular ejection fraction, by estimation, is 65 to 70%. Left  ventricular ejection  fraction by 3D volume is 70 %. The left ventricle has  normal function. The left ventricle has no regional wall motion  abnormalities. Left ventricular diastolic   parameters are consistent with Grade I diastolic dysfunction (impaired  relaxation).   2. Right ventricular systolic function is normal. The right ventricular  size is normal. There is normal pulmonary artery systolic pressure. The  estimated right ventricular systolic pressure is 31.5 mmHg.   3. Left atrial size was moderately dilated.   4. Right atrial size was moderately dilated.   5. The mitral valve is normal in structure. Trivial mitral valve  regurgitation. No evidence of mitral stenosis.   6. The aortic valve is tricuspid. Aortic valve regurgitation is trivial.  No aortic stenosis is present.   7. The inferior vena cava is normal in size with greater than 50%  respiratory variability, suggesting right atrial pressure of 3 mmHg.   Comparison(s): No significant change from prior study. Prior images  reviewed side by side.   08/02/21: TTE  1. Left ventricular ejection fraction, by estimation, is 60 to 65%. The  left ventricle has normal function. The left ventricle has no regional  wall motion abnormalities. There is mild asymmetric left ventricular  hypertrophy of the basal-septal segment.  Left ventricular diastolic parameters are consistent with Grade I  diastolic dysfunction (impaired relaxation). The average left ventricular  global longitudinal strain is -21.8 %. The global longitudinal strain is  normal.   2. Right ventricular systolic function is normal. The right ventricular  size is normal. There is normal pulmonary artery systolic pressure.   3. Left atrial size was severely dilated.   4. The mitral valve is normal in structure. Trivial mitral valve  regurgitation. No evidence of mitral stenosis.   5. The  aortic valve is tricuspid. Aortic valve regurgitation is trivial.  No aortic stenosis is present.   6. The inferior vena cava is normal in size with greater than 50%  respiratory variability, suggesting right atrial pressure of 3 mmHg.    01/11/2019: [facility] IMPRESSION: Ms. Dant has normal coronary arteries and normal LV function.  The etiology of her chest pain, elevated troponins, EKG changes and wall motion and imagine 2D echo are still unclear.  She has normal LV function by ventriculography.  She may have had coronary spasm.  The sheath was removed and a TR band was placed on the right wrist to achieve patent hemostasis.  The patient left lab in stable condition.  She can be discharged home later today on her home dose of Eliquis  oral anticoagulation.   Recent Labs: 10/01/2023: TSH 1.372 01/27/2024: ALT 11; BUN 19; Creatinine, Ser 1.29; Hemoglobin 12.9; Platelets 185; Potassium 4.2; Sodium 134  09/22/2023: Cholesterol 143; HDL 46; LDL Cholesterol (Calc) 75; Total CHOL/HDL Ratio 3.1; Triglycerides 136   CrCl cannot be calculated (Patient's most recent lab result is older than the maximum 21 days allowed.).   Wt Readings from Last 3 Encounters:  02/26/24 139 lb 8.8 oz (63.3 kg)  02/16/24 139 lb 9.6 oz (63.3 kg)  02/13/24 135 lb 12.8 oz (61.6 kg)     Other studies reviewed: Additional studies/records reviewed today include: summarized above  ASSESSMENT AND PLAN:  1. Persistent AFib, Aflutter     S/p CTI and PVI ablation 2015     CHA2DS2Vasc is 4 , on Eliquis , *** appropriately dosed ***  2. CHF ?HFpEF No symptoms/exam findings of overtvolume OL Advised continue support stocking and elevation when seated ***  3. HTN     ***  No changes today  4. Bradycardia 5. RBBB *** No symptoms of bradycardia   6. Secondary hypercoagulable state  7. CP, SOB *** No obst CAD by CT Aug 2025 Preserved LVEF, no sign VHD     Disposition: ***     Current medicines are reviewed at  length with the patient today.  The patient did not have any concerns regarding medicines.  Bonney Charlies Arthur, PA-C 03/21/2024 10:24 AM     [facility] [address] [place] [address] [phone] (office)  [phone] (fax)

## 2024-03-22 ENCOUNTER — Telehealth: Payer: Self-pay | Admitting: Family Medicine

## 2024-03-22 ENCOUNTER — Encounter (HOSPITAL_COMMUNITY): Payer: Self-pay

## 2024-03-22 ENCOUNTER — Ambulatory Visit: Attending: Physician Assistant | Admitting: Physician Assistant

## 2024-03-22 VITALS — BP 124/64 | HR 66 | Ht 64.0 in | Wt 143.0 lb

## 2024-03-22 DIAGNOSIS — D6869 Other thrombophilia: Secondary | ICD-10-CM

## 2024-03-22 DIAGNOSIS — I10 Essential (primary) hypertension: Secondary | ICD-10-CM | POA: Diagnosis not present

## 2024-03-22 DIAGNOSIS — I4819 Other persistent atrial fibrillation: Secondary | ICD-10-CM

## 2024-03-22 DIAGNOSIS — I4510 Unspecified right bundle-branch block: Secondary | ICD-10-CM

## 2024-03-22 NOTE — Progress Notes (Signed)
 RE: ELIQUIS  Received: 3 days ago Duanne Butler DASEN, MD  Tommie Riggs That should be fine.  Thanks Tom       Previous Messages    ----- Message ----- From: Tommie Riggs Sent: 03/19/2024   9:24 AM EDT To: Butler DASEN Duanne, MD Subject: ELIQUIS                                         Good Morning,  Dr.Mckenzie has requested that we place a right nephrostomy tube on a mutual patient.  She is taking Eliquis  and per protocol will need to hold 2 days prior.  Will this be ok?  Thanks, Riggs Tommie

## 2024-03-22 NOTE — Patient Instructions (Signed)
 Medication Instructions:   Your physician recommends that you continue on your current medications as directed. Please refer to the Current Medication list given to you today.  *If you need a refill on your cardiac medications before your next appointment, please call your pharmacy*   Lab Work: NONE ORDERED  TODAY   If you have labs (blood work) drawn today and your tests are completely normal, you will receive your results only by: MyChart Message (if you have MyChart) OR A paper copy in the mail If you have any lab test that is abnormal or we need to change your treatment, we will call you to review the results.  Testing/Procedures: NONE ORDERED  TODAY   Follow-Up: At [facility], you and your health needs are our priority.  As part of our continuing mission to provide you with exceptional heart care, our providers are all part of one team.  This team includes your primary Cardiologist (physician) and Advanced Practice Providers or APPs (Physician Assistants and Nurse Practitioners) who all work together to provide you with the care you need, when you need it.  Your next appointment:    6 month(s)  Provider:   You may see OLE ONEIDA HOLTS, MD  or  Charlies Arthur, PA-C  We recommend signing up for the patient portal called MyChart.  Sign up information is provided on this After Visit Summary.  MyChart is used to connect with patients for Virtual Visits (Telemedicine).  Patients are able to view lab/test results, encounter notes, upcoming appointments, etc.  Non-urgent messages can be sent to your provider as well.   To learn more about what you can do with MyChart, go to [email or web address].   Other Instructions

## 2024-03-22 NOTE — Telephone Encounter (Signed)
 Prescription Request  03/22/2024  LOV: 02/16/2024  What is the name of the medication or equipment?   HYDROcodone -acetaminophen  (NORCO) 10-325 MG tablet   Have you contacted your pharmacy to request a refill? Yes   Which pharmacy would you like this sent to?  [facility] [place], [place] - [address] [place] [address] Phone: [phone] Fax: [phone]    Patient notified that their request is being sent to the clinical staff for review and that they should receive a response within 2 business days.   Please advise pharmacist.

## 2024-03-23 ENCOUNTER — Other Ambulatory Visit: Payer: Self-pay | Admitting: Family Medicine

## 2024-03-23 MED ORDER — HYDROCODONE-ACETAMINOPHEN 10-325 MG PO TABS: 1.0000 | ORAL_TABLET | Freq: Four times a day (QID) | ORAL | 0 refills | Status: DC | PRN

## 2024-03-25 ENCOUNTER — Encounter: Payer: Self-pay | Admitting: Family Medicine

## 2024-03-25 ENCOUNTER — Ambulatory Visit (INDEPENDENT_AMBULATORY_CARE_PROVIDER_SITE_OTHER): Admitting: Family Medicine

## 2024-03-25 VITALS — BP 116/62 | HR 56 | Temp 98.2°F | Ht 64.0 in | Wt 141.3 lb

## 2024-03-25 DIAGNOSIS — N135 Crossing vessel and stricture of ureter without hydronephrosis: Secondary | ICD-10-CM

## 2024-03-25 NOTE — Progress Notes (Signed)
 .    Subjective:    Patient ID: Erika Lee, female    DOB: 1945/07/10, 78 y.o.   MRN: [id number] Patient is here today for upcoming surgery.  Last year, developed acute renal insufficiency.  She was found to have right-sided hydro (.  Ultimately this was determined to be due to a right ureteral stricture.  Urology placed a stent to repair this however after the stent was removed, the stricture returned.  Patient recently had cystoscopy under the care of Dr. Sherrilee.  He was unable to pass a wire or even a rigid ureteroscope due to complete obliteration of the right ureter.  Therefore the decision was made to have operative repair in an effort to salvage the right kidney.  Patient is here today to discuss her options Past Medical History:  Diagnosis Date   Anticoagulant long-term use    eliquis    Chronic kidney disease    Chronic sinusitis    Dysrhythmia    A.fib,   GERD (gastroesophageal reflux disease)    Heart failure with preserved ejection fraction (HCC)    Hemorrhoids    History of colitis 10/2016   campylobacter   Hyperlipidemia    Hypertension    Multinodular goiter    per pt has had 2 biopsy's both benign   NSTEMI (non-ST elevated myocardial infarction) (HCC)    12/2018- normal coronary arteries on cath, poss coronary vasospasm.    Osteoarthritis    all over  and [facility] joint left shoulder   Paroxysmal atrial fibrillation [facility]) EP cardiologist--  dr kelsie   s/p PVI by Dr kelsie 07/16/2013;   pt first dx 2008,  recurrence 2014 afib/flutter and tachy-brady   Rotator cuff tear, left    Shoulder impingement, left    Status post placement of implantable loop recorder    original placement 07-26-2014;  removal / replacement 10-17-2017  by dr allred   Type 2 diabetes mellitus (HCC)    followed by pcp   Past Surgical History:  Procedure Laterality Date   ATRIAL FIBRILLATION ABLATION N/A 07/16/2013   PVI and CTI ablation by Dr kelsie   CARDIAC CATHETERIZATION     01/11/2019-  Normal coronary arteries.    COLONOSCOPY  09/10/2011   Dr. Mavis: normal, 10 year follow up.   CYSTOSCOPY W/ URETERAL STENT PLACEMENT Right 10/22/2023   Procedure: CYSTOSCOPY, WITH RETROGRADE PYELOGRAM, RIGHT URETEROSCOPY, PERFORMANCE OF RIGHT RENAL WASHINGS AND URETERAL STENT INSERTION;  Surgeon: Matilda Senior, MD;  Location: [facility];  Service: Urology;  Laterality: Right;   CYSTOSCOPY WITH RETROGRADE PYELOGRAM, URETEROSCOPY AND STENT PLACEMENT Right 02/26/2024   Procedure: DIAGNOSTIC URETEROSCOPY, RETROGRADE PYELOGRAM.;  Surgeon: Sherrilee Belvie CROME, MD;  Location: [facility];  Service: Urology;  Laterality: Right;   implantable loop recorder removal  04/25/2021   MDT LINQ removed by Dr kelsie   LEFT HEART CATH AND CORONARY ANGIOGRAPHY N/A 01/11/2019   Procedure: LEFT HEART CATH AND CORONARY ANGIOGRAPHY;  Surgeon: Court Dorn PARAS, MD;  Location: [facility];  Service: Cardiovascular;  Laterality: N/A;   LESION REMOVAL N/A 05/03/2013   Procedure: EXCISION CYST, BACK;  Surgeon: Oneil DELENA Mavis, MD;  Location: [facility];  Service: General;  Laterality: N/A;   LOOP RECORDER IMPLANT N/A 07/26/2014   Procedure: LOOP RECORDER IMPLANT;  Surgeon: Lynwood kelsie, MD;  Location: [facility] CATH LAB;  Service: Cardiovascular;  Laterality: N/A;   LOOP RECORDER INSERTION N/A 10/17/2017   MDT previously implanted ILR for afib management at [facility].  old device  removed and new device placed by Dr Kelsie   LOOP RECORDER REMOVAL N/A 10/17/2017   MDT ILR removed with new device subsequently replaced   SHOULDER ARTHROSCOPY WITH ROTATOR CUFF REPAIR AND SUBACROMIAL DECOMPRESSION Left 01/07/2019   Procedure: Left shoulder subacromial decompression, distal clavicle resection, extensive debridement,;  Surgeon: Sharl Selinda Dover, MD;  Location: [facility];  Service: Orthopedics;  Laterality: Left;   TEE WITHOUT CARDIOVERSION N/A 07/15/2013   Procedure: TRANSESOPHAGEAL ECHOCARDIOGRAM (TEE);  Surgeon: Redell GORMAN Shallow, MD;  Location: [facility]OS[facility];  Service: Cardiovascular;  Laterality: N/A;   TONSILLECTOMY  age 49   TRANSANAL HEMORRHOIDAL DEARTERIALIZATION N/A 09/22/2015   Procedure: TRANSANAL HEMORRHOIDAL LIGATION/PEXY EUA POSSIBLE HEMORRHOIDECTOMY ;  Surgeon: Elspeth Schultze, MD;  Location: [facility];  Service: General;  Laterality: N/A;   URETERAL BIOPSY Right 02/26/2024   Procedure: BIOPSY, URETER;  Surgeon: Sherrilee Dover CROME, MD;  Location: [facility];  Service: Urology;  Laterality: Right;   Current Outpatient Medications on File Prior to Visit  Medication Sig Dispense Refill   amLODipine  (NORVASC ) 5 MG tablet Take 1 tablet (5 mg total) by mouth daily. 90 tablet 3   apixaban  (ELIQUIS ) 5 MG TABS tablet Take 1 tablet (5 mg total) by mouth 2 (two) times daily. 180 tablet 3   azelastine  (ASTELIN ) 0.1 % nasal spray Place 1 spray into both nostrils 2 (two) times daily. Use in each nostril as directed- helps with postnasal drip 30 mL 1   diazepam  (VALIUM ) 5 MG tablet TAKE 1 TABLET(5 MG) BY MOUTH AT BEDTIME AS NEEDED FOR SLEEP 30 tablet 3   estradiol  (ESTRACE ) 0.1 MG/GM vaginal cream Place 1 Applicatorful vaginally at bedtime. 42.5 g 12   glucose blood (FREESTYLE PRECISION NEO TEST) test strip DX: E11.9 Use to check blood sugar daily 100 strip 5   HYDROcodone -acetaminophen  (NORCO) 10-325 MG tablet Take 1 tablet by mouth every 6 (six) hours as needed. 120 tablet 0   Lancets (FREESTYLE) lancets Use to check blood sugar daily. 100 each 5   lisinopril  (ZESTRIL ) 40 MG tablet Take 1 tablet (40 mg total) by mouth daily. 90 tablet 3   mometasone  (ELOCON ) 0.1 % cream Apply topically daily. 15 g 1   ondansetron  (ZOFRAN ) 4 MG tablet Take 1 tablet (4 mg total) by mouth every 8 (eight) hours as needed for nausea or vomiting. 30 tablet 0   pantoprazole  (PROTONIX ) 40 MG tablet Take 1 tablet (40 mg total) by mouth daily. 90 tablet 3   predniSONE  (DELTASONE ) 20 MG tablet 3 tabs poqday 1-2, 2 tabs poqday 3-4, 1 tab poqday 5-6 12  tablet 0   promethazine  (PHENERGAN ) 12.5 MG tablet Take 1 tablet (12.5 mg total) by mouth every 6 (six) hours as needed for nausea or vomiting. 30 tablet 3   simvastatin  (ZOCOR ) 40 MG tablet Take 1 tablet (40 mg total) by mouth daily at 6 PM. 90 tablet 2   spironolactone  (ALDACTONE ) 25 MG tablet Take 0.5 tablets (12.5 mg total) by mouth daily. 90 tablet 3   tamsulosin (FLOMAX) 0.4 MG CAPS capsule Take by mouth.     No current facility-administered medications on file prior to visit.     Allergies  Allergen Reactions   Clindamycin /Lincomycin Rash   Doxycycline Other (See Comments)    Makes heart race   Keflex  [Cephalexin ] Nausea And Vomiting   Sulfonic Acid (3,5-Dibromo-4-H-Ox-Benz) Other (See Comments)    Unknown    Adhesive [Tape] Rash   Latex Rash   Sulfonamide Derivatives Nausea And  Vomiting   Social History   Socioeconomic History   Marital status: Married    Spouse name: Interior and spatial designer   Number of children: 2   Years of education: 12   Highest education level: 12th grade  Occupational History   Occupation: Advertising copywriter: RETIRED    Comment: Full time  Tobacco Use   Smoking status: Former    Current packs/day: 0.00    Types: Cigarettes    Start date: 01/03/1960    Quit date: 01/02/1994    Years since quitting: 30.2    Passive exposure: Past   Smokeless tobacco: Never  Vaping Use   Vaping status: Never Used  Substance and Sexual Activity   Alcohol use: No   Drug use: No   Sexual activity: Not Currently    Partners: Male  Other Topics Concern   Not on file  Social History Narrative   One child deceased from homicide at age 17    Social Drivers of Health   Financial Resource Strain: Low Risk  (05/21/2022)   Overall Financial Resource Strain (CARDIA)    Difficulty of Paying Living Expenses: Not hard at all  Food Insecurity: No Food Insecurity (10/23/2023)   Hunger Vital Sign    Worried About Running Out of Food in the Last Year: Never true     Ran Out of Food in the Last Year: Never true  Transportation Needs: No Transportation Needs (10/23/2023)   PRAPARE - Administrator, Civil Service (Medical): No    Lack of Transportation (Non-Medical): No  Physical Activity: Inactive (05/21/2022)   Exercise Vital Sign    Days of Exercise per Week: 0 days    Minutes of Exercise per Session: 0 min  Stress: Stress Concern Present (05/21/2022)   [employer] of Occupational Health - Occupational Stress Questionnaire    Feeling of Stress : To some extent  Social Connections: Moderately Isolated (10/23/2023)   Social Connection and Isolation Panel    Frequency of Communication with Friends and Family: More than three times a week    Frequency of Social Gatherings with Friends and Family: Once a week    Attends Religious Services: 1 to 4 times per year    Active Member of [employer] or Organizations: No    Attends Banker Meetings: Never    Marital Status: Widowed  Intimate Partner Violence: Not At Risk (10/23/2023)   Humiliation, Afraid, Rape, and Kick questionnaire    Fear of Current or Ex-Partner: No    Emotionally Abused: No    Physically Abused: No    Sexually Abused: No     Review of Systems  All other systems reviewed and are negative.      Objective:   Physical Exam Vitals reviewed.  Constitutional:      General: She is not in acute distress.    Appearance: Normal appearance. She is not ill-appearing or toxic-appearing.  Cardiovascular:     Rate and Rhythm: Normal rate and regular rhythm.     Pulses: Normal pulses.     Heart sounds: Normal heart sounds. No murmur heard.    No friction rub. No gallop.  Pulmonary:     Effort: Pulmonary effort is normal. No respiratory distress.     Breath sounds: No stridor. No wheezing, rhonchi or rales.  Abdominal:     General: Abdomen is flat. Bowel sounds are normal. There is no distension.     Palpations: Abdomen is soft.  Tenderness: There is no  abdominal tenderness. There is no guarding.  Musculoskeletal:     Cervical back: Neck supple.  Neurological:     General: No focal deficit present.     Mental Status: She is alert and oriented to person, place, and time.     Cranial Nerves: No cranial nerve deficit.     Sensory: No sensory deficit.     Motor: No weakness.     Coordination: Coordination normal.     Gait: Gait normal.     Deep Tendon Reflexes: Reflexes normal.  Psychiatric:        Mood and Affect: Mood normal.        Thought Content: Thought content normal.           Assessment & Plan:  Obstruction of right ureter  I explained to the patient that if she does not remove the obstruction, the right kidney would eventually atrophy and become nonfunctional.  I recommended she proceed with the elective surgery.

## 2024-03-29 ENCOUNTER — Other Ambulatory Visit: Payer: Self-pay | Admitting: Radiology

## 2024-03-29 DIAGNOSIS — N1330 Unspecified hydronephrosis: Secondary | ICD-10-CM

## 2024-03-30 NOTE — H&P (Incomplete)
 Chief Complaint: Right hydronephrosis with ureteral stricture, failed attempt at retrograde ureteral stent placement; referred for right percutaneous nephrostomy/nephroureteral stent placement  Referring Provider(s): McKenzie,P  Supervising Physician: Hughes Simmonds  Patient Status: [facility] - Out-pt  History of Present Illness: Erika Lee is an 78 y.o. female with past medical history significant for chronic kidney disease, OA,  chronic sinusitis, atrial fibrillation on Eliquis , GERD, heart failure, hyperlipidemia, hypertension, uterine fibroid, gallstones, multinodular goiter, prior non-ST elevated MI, diabetes who presents now with right hydronephrosis and ureteral stricture with failed attempt at retrograde ureteral stent placement on 02/26/2024.  Right ureteral biopsy revealed benign urothelium and submucosa with fibrosis.  She presents today for right percutaneous nephrostomy/possible nephroureteral stent placement.  *** Patient is Full Code  Past Medical History:  Diagnosis Date   Anticoagulant long-term use    eliquis    Chronic kidney disease    Chronic sinusitis    Dysrhythmia    A.fib,   GERD (gastroesophageal reflux disease)    Heart failure with preserved ejection fraction (HCC)    Hemorrhoids    History of colitis 10/2016   campylobacter   Hyperlipidemia    Hypertension    Multinodular goiter    per pt has had 2 biopsy's both benign   NSTEMI (non-ST elevated myocardial infarction) (HCC)    12/2018- normal coronary arteries on cath, poss coronary vasospasm.    Osteoarthritis    all over  and [facility] joint left shoulder   Paroxysmal atrial fibrillation [facility]) EP cardiologist--  dr kelsie   s/p PVI by Dr kelsie 07/16/2013;   pt first dx 2008,  recurrence 2014 afib/flutter and tachy-brady   Rotator cuff tear, left    Shoulder impingement, left    Status post placement of implantable loop recorder    original placement 07-26-2014;  removal / replacement 10-17-2017  by dr  Haelyn Forgey   Type 2 diabetes mellitus (HCC)    followed by pcp    Past Surgical History:  Procedure Laterality Date   ATRIAL FIBRILLATION ABLATION N/A 07/16/2013   PVI and CTI ablation by Dr kelsie   CARDIAC CATHETERIZATION     01/11/2019- Normal coronary arteries.    COLONOSCOPY  09/10/2011   Dr. Mavis: normal, 10 year follow up.   CYSTOSCOPY W/ URETERAL STENT PLACEMENT Right 10/22/2023   Procedure: CYSTOSCOPY, WITH RETROGRADE PYELOGRAM, RIGHT URETEROSCOPY, PERFORMANCE OF RIGHT RENAL WASHINGS AND URETERAL STENT INSERTION;  Surgeon: Matilda Senior, MD;  Location: [facility] ORS;  Service: Urology;  Laterality: Right;   CYSTOSCOPY WITH RETROGRADE PYELOGRAM, URETEROSCOPY AND STENT PLACEMENT Right 02/26/2024   Procedure: DIAGNOSTIC URETEROSCOPY, RETROGRADE PYELOGRAM.;  Surgeon: Sherrilee Belvie CROME, MD;  Location: [facility];  Service: Urology;  Laterality: Right;   implantable loop recorder removal  04/25/2021   MDT LINQ removed by Dr kelsie   LEFT HEART CATH AND CORONARY ANGIOGRAPHY N/A 01/11/2019   Procedure: LEFT HEART CATH AND CORONARY ANGIOGRAPHY;  Surgeon: Court Dorn PARAS, MD;  Location: [facility] INVASIVE CV LAB;  Service: Cardiovascular;  Laterality: N/A;   LESION REMOVAL N/A 05/03/2013   Procedure: EXCISION CYST, BACK;  Surgeon: Oneil DELENA Mavis, MD;  Location: [facility] ORS;  Service: General;  Laterality: N/A;   LOOP RECORDER IMPLANT N/A 07/26/2014   Procedure: LOOP RECORDER IMPLANT;  Surgeon: Lynwood kelsie, MD;  Location: [facility] C[facility] LAB;  Service: Cardiovascular;  Laterality: N/A;   LOOP RECORDER INSERTION N/A 10/17/2017   MDT previously implanted ILR for afib management at [facility].  old device removed and new device placed  by Dr Kelsie   LOOP RECORDER REMOVAL N/A 10/17/2017   MDT ILR removed with new device subsequently replaced   SHOULDER ARTHROSCOPY WITH ROTATOR CUFF REPAIR AND SUBACROMIAL DECOMPRESSION Left 01/07/2019   Procedure: Left shoulder subacromial decompression, distal clavicle resection, extensive  debridement,;  Surgeon: Sharl Selinda Dover, MD;  Location: [facility];  Service: Orthopedics;  Laterality: Left;   TEE WITHOUT CARDIOVERSION N/A 07/15/2013   Procedure: TRANSESOPHAGEAL ECHOCARDIOGRAM (TEE);  Surgeon: Redell GORMAN Shallow, MD;  Location: [facility] ENDOSCOPY;  Service: Cardiovascular;  Laterality: N/A;   TONSILLECTOMY  age 56   TRANSANAL HEMORRHOIDAL DEARTERIALIZATION N/A 09/22/2015   Procedure: TRANSANAL HEMORRHOIDAL LIGATION/PEXY EUA POSSIBLE HEMORRHOIDECTOMY ;  Surgeon: Elspeth Schultze, MD;  Location: [facility];  Service: General;  Laterality: N/A;   URETERAL BIOPSY Right 02/26/2024   Procedure: BIOPSY, URETER;  Surgeon: Sherrilee Dover CROME, MD;  Location: [facility];  Service: Urology;  Laterality: Right;    Allergies: Clindamycin /lincomycin; Doxycycline; Keflex  [cephalexin ]; Sulfonic acid (3,5-dibromo-4-h-ox-benz); Adhesive [tape]; Latex; and Sulfonamide derivatives  Medications: Prior to Admission medications   Medication Sig Start Date End Date Taking? Authorizing Provider  amLODipine  (NORVASC ) 5 MG tablet Take 1 tablet (5 mg total) by mouth daily. 09/22/23   Duanne Butler DASEN, MD  apixaban  (ELIQUIS ) 5 MG TABS tablet Take 1 tablet (5 mg total) by mouth 2 (two) times daily. 10/28/23   Duanne Butler DASEN, MD  azelastine  (ASTELIN ) 0.1 % nasal spray Place 1 spray into both nostrils 2 (two) times daily. Use in each nostril as directed- helps with postnasal drip 09/03/23   Aletha Bene, MD  diazepam  (VALIUM ) 5 MG tablet TAKE 1 TABLET(5 MG) BY MOUTH AT BEDTIME AS NEEDED FOR SLEEP 02/10/24   Duanne Butler DASEN, MD  estradiol  (ESTRACE ) 0.1 MG/GM vaginal cream Place 1 Applicatorful vaginally at bedtime. 08/08/23   Duanne Butler DASEN, MD  glucose blood (FREESTYLE PRECISION NEO TEST) test strip DX: E11.9 Use to check blood sugar daily 02/16/24   Duanne Butler DASEN, MD  HYDROcodone -acetaminophen  (NORCO) 10-325 MG tablet Take 1 tablet by mouth every 6 (six) hours as needed. 03/23/24   Duanne Butler DASEN, MD  Lancets (FREESTYLE) lancets Use to check blood sugar daily. 12/31/22   Duanne Butler DASEN, MD  lisinopril  (ZESTRIL ) 40 MG tablet Take 1 tablet (40 mg total) by mouth daily. 10/28/23   Duanne Butler DASEN, MD  mometasone  (ELOCON ) 0.1 % cream Apply topically daily. 02/13/24   Duanne Butler DASEN, MD  ondansetron  (ZOFRAN ) 4 MG tablet Take 1 tablet (4 mg total) by mouth every 8 (eight) hours as needed for nausea or vomiting. 01/05/24   Aletha Bene, MD  pantoprazole  (PROTONIX ) 40 MG tablet Take 1 tablet (40 mg total) by mouth daily. 01/22/24   Duanne Butler DASEN, MD  predniSONE  (DELTASONE ) 20 MG tablet 3 tabs poqday 1-2, 2 tabs poqday 3-4, 1 tab poqday 5-6 02/13/24   Duanne Butler DASEN, MD  promethazine  (PHENERGAN ) 12.5 MG tablet Take 1 tablet (12.5 mg total) by mouth every 6 (six) hours as needed for nausea or vomiting. 10/28/23   Duanne Butler DASEN, MD  simvastatin  (ZOCOR ) 40 MG tablet Take 1 tablet (40 mg total) by mouth daily at 6 PM. 06/12/23   Duanne Butler DASEN, MD  spironolactone  (ALDACTONE ) 25 MG tablet Take 0.5 tablets (12.5 mg total) by mouth daily. 10/28/23   Duanne Butler DASEN, MD  tamsulosin (FLOMAX) 0.4 MG CAPS capsule Take by mouth. 11/18/23   [provider]     Family History  Problem Relation Age of Onset   Stroke Mother 6   Stroke Maternal Grandmother        stroke   Other Maternal Grandfather        heart dropsy kidney issues   Kidney disease Maternal Grandfather    Colon cancer Neg Hx     Social History   Socioeconomic History   Marital status: Married    Spouse name: Interior and spatial designer   Number of children: 2   Years of education: 12   Highest education level: 12th grade  Occupational History   Occupation: Advertising copywriter: RETIRED    Comment: Full time  Tobacco Use   Smoking status: Former    Current packs/day: 0.00    Types: Cigarettes    Start date: 01/03/1960    Quit date: 01/02/1994    Years since quitting: 30.2    Passive exposure: Past    Smokeless tobacco: Never  Vaping Use   Vaping status: Never Used  Substance and Sexual Activity   Alcohol use: No   Drug use: No   Sexual activity: Not Currently    Partners: Male  Other Topics Concern   Not on file  Social History Narrative   One child deceased from homicide at age 22    Social Drivers of Health   Financial Resource Strain: Low Risk  (05/21/2022)   Overall Financial Resource Strain (CARDIA)    Difficulty of Paying Living Expenses: Not hard at all  Food Insecurity: No Food Insecurity (10/23/2023)   Hunger Vital Sign    Worried About Running Out of Food in the Last Year: Never true    Ran Out of Food in the Last Year: Never true  Transportation Needs: No Transportation Needs (10/23/2023)   PRAPARE - Administrator, Civil Service (Medical): No    Lack of Transportation (Non-Medical): No  Physical Activity: Inactive (05/21/2022)   Exercise Vital Sign    Days of Exercise per Week: 0 days    Minutes of Exercise per Session: 0 min  Stress: Stress Concern Present (05/21/2022)   [employer] of Occupational Health - Occupational Stress Questionnaire    Feeling of Stress : To some extent  Social Connections: Moderately Isolated (10/23/2023)   Social Connection and Isolation Panel    Frequency of Communication with Friends and Family: More than three times a week    Frequency of Social Gatherings with Friends and Family: Once a week    Attends Religious Services: 1 to 4 times per year    Active Member of [employer] or Organizations: No    Attends Banker Meetings: Never    Marital Status: Widowed       Review of Systems  Vital Signs: LMP  (LMP Unknown)   Advance Care Plan: No documents on file.  Physical Exam  Imaging: ECHOCARDIOGRAM COMPLETE Result Date: 03/15/2024    ECHOCARDIOGRAM REPORT   Patient Name:   Erika Lee Date of Exam: 03/15/2024 Medical Rec #:  [id number]     Height:       64.0 in Accession #:    [id number]     Weight:       139.6 lb Date of Birth:  03-12-1946     BSA:          1.679 m Patient Age:    78 years      BP:           115/67 mmHg Patient Gender: F  HR:           59 bpm. Exam Location:  [address] Procedure: 2D Echo, Cardiac Doppler and Color Doppler (Both Spectral and Color            Flow Doppler were utilized during procedure). Indications:    R06.00 Dyspnea  History:        Patient has prior history of Echocardiogram examinations, most                 recent 05/09/2022. NSTEMI., Arrythmias:Paroxysmal Atrial                 Fibrillation.; Risk Factors:Hypertension, Diabetes and                 Dyslipidemia.  Sonographer:    Carl Coma RDCS Referring Phys: [id number] RENEE LYNN URSUY IMPRESSIONS  1. Left ventricular ejection fraction, by estimation, is 65 to 70%. The left ventricle has normal function. The left ventricle has no regional wall motion abnormalities. Left ventricular diastolic parameters were normal.  2. Right ventricular systolic function is normal. The right ventricular size is normal. There is normal pulmonary artery systolic pressure. The estimated right ventricular systolic pressure is 27.5 mmHg.  3. Left atrial size was moderately dilated.  4. The mitral valve is normal in structure. Trivial mitral valve regurgitation. No evidence of mitral stenosis.  5. The aortic valve is tricuspid. Aortic valve regurgitation is trivial. No aortic stenosis is present.  6. The inferior vena cava is normal in size with <50% respiratory variability, suggesting right atrial pressure of 8 mmHg. Comparison(s): No significant change from prior study. Conclusion(s)/Recommendation(s): Otherwise normal echocardiogram, with minor abnormalities described in the report. FINDINGS  Left Ventricle: Left ventricular ejection fraction, by estimation, is 65 to 70%. The left ventricle has normal function. The left ventricle has no regional wall motion abnormalities. The left ventricular internal cavity  size was normal in size. There is  no left ventricular hypertrophy. Left ventricular diastolic parameters were normal. Right Ventricle: The right ventricular size is normal. No increase in right ventricular wall thickness. Right ventricular systolic function is normal. There is normal pulmonary artery systolic pressure. The tricuspid regurgitant velocity is 2.21 m/s, and  with an assumed right atrial pressure of 8 mmHg, the estimated right ventricular systolic pressure is 27.5 mmHg. Left Atrium: Left atrial size was moderately dilated. Right Atrium: Right atrial size was normal in size. Pericardium: There is no evidence of pericardial effusion. Mitral Valve: The mitral valve is normal in structure. Trivial mitral valve regurgitation. No evidence of mitral valve stenosis. Tricuspid Valve: The tricuspid valve is normal in structure. Tricuspid valve regurgitation is trivial. No evidence of tricuspid stenosis. Aortic Valve: The aortic valve is tricuspid. Aortic valve regurgitation is trivial. No aortic stenosis is present. Pulmonic Valve: The pulmonic valve was not well visualized. Pulmonic valve regurgitation is trivial. No evidence of pulmonic stenosis. Aorta: The aortic root, ascending aorta and aortic arch are all structurally normal, with no evidence of dilitation or obstruction. Venous: The inferior vena cava is normal in size with less than 50% respiratory variability, suggesting right atrial pressure of 8 mmHg. IAS/Shunts: The atrial septum is grossly normal.  LEFT VENTRICLE PLAX 2D LVIDd:         4.80 cm   Diastology LVIDs:         2.60 cm   LV e' medial:    8.38 cm/s LV PW:         0.90 cm   LV  E/e' medial:  10.9 LV IVS:        0.70 cm   LV e' lateral:   14.55 cm/s LVOT diam:     1.70 cm   LV E/e' lateral: 6.3 LV SV:         57 LV SV Index:   34 LVOT Area:     2.27 cm  RIGHT VENTRICLE             IVC RV Basal diam:  3.38 cm     IVC diam: 1.50 cm RV S prime:     16.85 cm/s TAPSE (M-mode): 2.3 cm LEFT ATRIUM              Index        RIGHT ATRIUM           Index LA diam:        4.80 cm 2.86 cm/m   RA Area:     13.50 cm LA Vol (A2C):   71.3 ml 42.47 ml/m  RA Volume:   33.40 ml  19.89 ml/m LA Vol (A4C):   59.4 ml 35.38 ml/m LA Biplane Vol: 64.9 ml 38.66 ml/m  AORTIC VALVE LVOT Vmax:   112.00 cm/s LVOT Vmean:  73.900 cm/s LVOT VTI:    0.252 m  AORTA Ao Root diam: 3.00 cm Ao Asc diam:  3.50 cm MITRAL VALVE               TRICUSPID VALVE MV Area (PHT): 3.56 cm    TR Peak grad:   19.5 mmHg MV Decel Time: 213 msec    TR Vmax:        221.00 cm/s MV E velocity: 91.45 cm/s MV A velocity: 73.10 cm/s  SHUNTS MV E/A ratio:  1.25        Systemic VTI:  0.25 m                            Systemic Diam: 1.70 cm Shelda Bruckner MD Electronically signed by Shelda Bruckner MD Signature Date/Time: 03/15/2024/6:13:55 PM    Final     Labs:  CBC: Recent Labs    11/19/23 1919 01/05/24 1042 01/22/24 1610 01/27/24 1650  WBC 7.7 11.2* 7.3 6.2  HGB 13.0 12.8 11.8 12.9  HCT 38.4 39.3 36.9 38.8  PLT 160 239 213 185    COAGS: No results for input(s): INR, APTT in the last 8760 hours.  BMP: Recent Labs    10/22/23 2309 10/23/23 0420 10/28/23 1546 11/19/23 1919 01/05/24 1042 01/22/24 1610 01/27/24 1650  NA 134* 135   < > 131* 135 138 134*  K 4.0 3.8   < > 3.8 4.6 4.6 4.2  CL 98 100   < > 100 98 103 102  CO2 25 27   < > 24 26 26  21*  GLUCOSE 239* 146*   < > 190* 105* 98 178*  BUN 21 19   < > 16 15 19 19   CALCIUM  10.2 9.9   < > 9.6 10.0 10.1 9.8  CREATININE 1.16* 0.96   < > 1.34* 1.31* 1.21* 1.29*  GFRNONAA 48* >60  --  40*  --   --  42*   < > = values in this interval not displayed.    LIVER FUNCTION TESTS: Recent Labs    10/22/23 2309 11/19/23 1919 01/05/24 1042 01/22/24 1610 01/27/24 1650  BILITOT 0.7 0.7 0.5 0.3 0.6  AST 25 15 13 13  18  ALT 18 11 9 8 11   ALKPHOS 82 71  --   --  87  PROT 6.8 6.4* 6.9 6.5 6.9  ALBUMIN 4.1 4.0  --   --  4.1    TUMOR MARKERS: No results for  input(s): AFPTM, CEA, CA199, CHROMGRNA in the last 8760 hours.  Assessment and Plan: 78 y.o. female with past medical history significant for chronic kidney disease, OA,  chronic sinusitis, atrial fibrillation on Eliquis , GERD, heart failure, hyperlipidemia, hypertension, uterine fibroid, gallstones, multinodular goiter, prior non-ST elevated MI, diabetes who presents now with right hydronephrosis and ureteral stricture with failed attempt at retrograde ureteral stent placement on 02/26/2024.  Right ureteral biopsy revealed benign urothelium and submucosa with fibrosis.  She presents today for right percutaneous nephrostomy/possible nephroureteral stent placement.Risks and benefits of right PCN placement was discussed with the patient including, but not limited to, infection, bleeding, significant bleeding causing loss or decrease in renal function or damage to adjacent structures.   All of the patient's questions were answered, patient is agreeable to proceed.  Consent signed and in chart.      Thank you for allowing our service to participate in Erika Lee 's care.  Electronically Signed: D. Franky Rakers, PA-C   03/30/2024, 4:10 PM      I spent a total of  25 minutes   in face to face in clinical consultation, greater than 50% of which was counseling/coordinating care for right percutaneous nephrostomy/nephroureteral catheter placement

## 2024-03-31 ENCOUNTER — Other Ambulatory Visit: Payer: Self-pay | Admitting: Urology

## 2024-03-31 ENCOUNTER — Ambulatory Visit (HOSPITAL_COMMUNITY)
Admission: RE | Admit: 2024-03-31 | Discharge: 2024-03-31 | Disposition: A | Discharge status: Home / Self Care | Source: Ambulatory Visit | Attending: Urology | Admitting: Urology

## 2024-03-31 ENCOUNTER — Encounter (HOSPITAL_COMMUNITY): Payer: Self-pay

## 2024-03-31 ENCOUNTER — Other Ambulatory Visit: Payer: Self-pay

## 2024-03-31 DIAGNOSIS — Z7901 Long term (current) use of anticoagulants: Secondary | ICD-10-CM | POA: Insufficient documentation

## 2024-03-31 DIAGNOSIS — I5032 Chronic diastolic (congestive) heart failure: Secondary | ICD-10-CM | POA: Insufficient documentation

## 2024-03-31 DIAGNOSIS — N1330 Unspecified hydronephrosis: Secondary | ICD-10-CM

## 2024-03-31 DIAGNOSIS — I480 Paroxysmal atrial fibrillation: Secondary | ICD-10-CM | POA: Diagnosis not present

## 2024-03-31 DIAGNOSIS — Z87891 Personal history of nicotine dependence: Secondary | ICD-10-CM | POA: Insufficient documentation

## 2024-03-31 DIAGNOSIS — E785 Hyperlipidemia, unspecified: Secondary | ICD-10-CM | POA: Insufficient documentation

## 2024-03-31 DIAGNOSIS — Z79899 Other long term (current) drug therapy: Secondary | ICD-10-CM | POA: Diagnosis not present

## 2024-03-31 DIAGNOSIS — N131 Hydronephrosis with ureteral stricture, not elsewhere classified: Secondary | ICD-10-CM | POA: Diagnosis not present

## 2024-03-31 DIAGNOSIS — I252 Old myocardial infarction: Secondary | ICD-10-CM | POA: Diagnosis not present

## 2024-03-31 DIAGNOSIS — E1122 Type 2 diabetes mellitus with diabetic chronic kidney disease: Secondary | ICD-10-CM | POA: Insufficient documentation

## 2024-03-31 DIAGNOSIS — I130 Hypertensive heart and chronic kidney disease with heart failure and stage 1 through stage 4 chronic kidney disease, or unspecified chronic kidney disease: Secondary | ICD-10-CM | POA: Diagnosis not present

## 2024-03-31 DIAGNOSIS — N189 Chronic kidney disease, unspecified: Secondary | ICD-10-CM | POA: Diagnosis not present

## 2024-03-31 LAB — PROTIME-INR
INR: 1 (ref 0.8–1.2)
Prothrombin Time: 13.3 s (ref 11.4–15.2)

## 2024-03-31 LAB — CBC WITH DIFFERENTIAL/PLATELET
Abs Immature Granulocytes: 0.01 K/uL (ref 0.00–0.07)
Basophils Absolute: 0 K/uL (ref 0.0–0.1)
Basophils Relative: 1 %
Eosinophils Absolute: 0.1 K/uL (ref 0.0–0.5)
Eosinophils Relative: 2 %
HCT: 40.8 % (ref 36.0–46.0)
Hemoglobin: 13.2 g/dL (ref 12.0–15.0)
Immature Granulocytes: 0 %
Lymphocytes Relative: 28 %
Lymphs Abs: 1.7 K/uL (ref 0.7–4.0)
MCH: 28.6 pg (ref 26.0–34.0)
MCHC: 32.4 g/dL (ref 30.0–36.0)
MCV: 88.3 fL (ref 80.0–100.0)
Monocytes Absolute: 0.7 K/uL (ref 0.1–1.0)
Monocytes Relative: 11 %
Neutro Abs: 3.6 K/uL (ref 1.7–7.7)
Neutrophils Relative %: 58 %
Platelets: 157 K/uL (ref 150–400)
RBC: 4.62 MIL/uL (ref 3.87–5.11)
RDW: 12.5 % (ref 11.5–15.5)
WBC: 6.2 K/uL (ref 4.0–10.5)
nRBC: 0 % (ref 0.0–0.2)

## 2024-03-31 LAB — BASIC METABOLIC PANEL WITH GFR
Anion gap: 11 (ref 5–15)
BUN: 17 mg/dL (ref 8–23)
CO2: 26 mmol/L (ref 22–32)
Calcium: 10.7 mg/dL — ABNORMAL HIGH (ref 8.9–10.3)
Chloride: 104 mmol/L (ref 98–111)
Creatinine, Ser: 1.31 mg/dL — ABNORMAL HIGH (ref 0.44–1.00)
GFR, Estimated: 41 mL/min — ABNORMAL LOW (ref >60)
Glucose, Bld: 118 mg/dL — ABNORMAL HIGH (ref 70–99)
Potassium: 5 mmol/L (ref 3.5–5.1)
Sodium: 141 mmol/L (ref 135–145)

## 2024-03-31 LAB — URINALYSIS, W/ REFLEX TO CULTURE (INFECTION SUSPECTED)
Bilirubin Urine: NEGATIVE
Glucose, UA: NEGATIVE mg/dL
Ketones, ur: NEGATIVE mg/dL
Leukocytes,Ua: NEGATIVE
Nitrite: NEGATIVE
Protein, ur: NEGATIVE mg/dL
RBC / HPF: >50 RBC/hpf (ref 0–5)
Specific Gravity, Urine: >1.046 — ABNORMAL HIGH (ref 1.005–1.030)
pH: 7 (ref 5.0–8.0)

## 2024-03-31 MED ORDER — SODIUM CHLORIDE 0.9% FLUSH: 5.0000 mL | Freq: Three times a day (TID) | INTRAVENOUS | Status: DC

## 2024-03-31 MED ORDER — MIDAZOLAM HCL 2 MG/2ML IJ SOLN
INTRAMUSCULAR | Status: AC
  Filled 2024-03-31: qty 4

## 2024-03-31 MED ORDER — FENTANYL CITRATE (PF) 100 MCG/2ML IJ SOLN
INTRAMUSCULAR | Status: AC
  Filled 2024-03-31: qty 2

## 2024-03-31 MED ORDER — IOHEXOL 300 MG/ML  SOLN
50.0000 mL | Freq: Once | INTRAMUSCULAR | Status: AC | PRN
  Administered 2024-03-31: 20 mL

## 2024-03-31 MED ORDER — LEVOFLOXACIN IN D5W 500 MG/100ML IV SOLN
500.0000 mg | Freq: Once | INTRAVENOUS | Status: AC
Start: 2024-03-31 — End: 2024-03-31
  Administered 2024-03-31: 500 mg via INTRAVENOUS
  Filled 2024-03-31 (×2): qty 100

## 2024-03-31 MED ORDER — MIDAZOLAM HCL 2 MG/2ML IJ SOLN
INTRAMUSCULAR | Status: AC | PRN
  Administered 2024-03-31 (×4): 1 mg via INTRAVENOUS

## 2024-03-31 MED ORDER — LIDOCAINE-EPINEPHRINE 1 %-1:100000 IJ SOLN
20.0000 mL | Freq: Once | INTRAMUSCULAR | Status: AC
  Administered 2024-03-31: 20 mL via INTRADERMAL

## 2024-03-31 MED ORDER — LIDOCAINE-EPINEPHRINE 1 %-1:100000 IJ SOLN
INTRAMUSCULAR | Status: AC
  Filled 2024-03-31: qty 1

## 2024-03-31 MED ORDER — MIDAZOLAM HCL 5 MG/5ML IJ SOLN: INTRAMUSCULAR | Status: AC | PRN

## 2024-03-31 MED ORDER — SODIUM CHLORIDE 0.9 % IV SOLN: INTRAVENOUS | Status: DC

## 2024-03-31 MED ORDER — OXYCODONE HCL 5 MG PO TABS: 10.0000 mg | ORAL_TABLET | ORAL | Status: DC | PRN

## 2024-03-31 MED ORDER — FENTANYL CITRATE (PF) 100 MCG/2ML IJ SOLN
INTRAMUSCULAR | Status: AC | PRN
  Administered 2024-03-31 (×2): 50 ug via INTRAVENOUS

## 2024-03-31 NOTE — Procedures (Signed)
 Vascular and Interventional Radiology Procedure Note  Patient: Erika Lee DOB: Apr 10, 1946 Medical Record Number: [id number] Note Date/Time: 03/31/24 10:53 AM   Performing Physician: Thom Hall, MD Assistant(s): None  Diagnosis: R mid ureteral stricture. Hydronephrosis  Procedure:  RIGHT NEPHROURETERAL CATHETER PLACEMENT RIGHT ANTEROGRADE NEPHROSTOGRAM  Anesthesia: Conscious Sedation Complications: None Estimated Blood Loss: Minimal Specimens:  None  Findings:  Successful placement of a 10 F 22 cm NU catheter into the right kidney(s).   Plan: Flush PCNU w 10 mL and record drain output qShift. Follow up for routine nephrostomy tube exchange in 8 week(s).   See detailed procedure note with images in PACS. The patient tolerated the procedure well without incident or complication and was returned to Recovery in stable condition.    Thom Hall, MD [facility]   Pager. [phone] Clinic. [phone]

## 2024-03-31 NOTE — Progress Notes (Signed)
 1500 Has information for next appointment 05-26-24 at 2 pm in Interventional Radiology found appointment card, for Nephrostomy tube exchange.

## 2024-03-31 NOTE — Discharge Instructions (Addendum)
 Discharge Instructions:    Please call Interventional Radiology clinic [phone] with any questions or concerns.   You may remove your dressing and shower tomorrow.    Percutaneous Nephrostomy, Care After  This sheet gives you information about how to care for yourself after your procedure. Your health care provider may also give you more specific instructions. If you have problems or questions, contact your health careprovider. What can I expect after the procedure? After the procedure, it is common to have: Some soreness where the nephrostomy tube was inserted (tube insertion site). Blood-tinged drainage from the nephrostomy tube for the first 24 hours. Follow these instructions at home: Activity  Do not lift anything that is heavier than 10 lb (4.5 kg), or the limit that you are told, until your health care provider says that it is safe. Return to your normal activities as told by your health care provider. Ask your health care provider what activities are safe for you. Avoid activities that may cause the nephrostomy tubing to bend. Do not take baths, swim, or use a hot tub until your health care provider approves. Ask your health care provider if you can take showers. Cover the nephrostomy tube bandage (dressing) with a watertight covering when you take a shower. If you were given a sedative during the procedure, it can affect you for several hours. Do not drive or operate machinery until your health care provider says that it is safe.   Care of the tube insertion site  Follow instructions from your health care provider about how to take care of your tube insertion site. Make sure you: Wash your hands with soap and water  for at least 20 seconds before you change your dressing. If soap and water  are not available, use hand sanitizer. Change your dressing as told by your health care provider. Be careful not to pull on the tube while removing the dressing. When you change the  dressing, wash the skin around the tube, rinse well, and pat the skin dry. Check the tube insertion area every day for signs of infection. Check for: Redness, swelling, or pain. Fluid or blood. Warmth. Pus or a bad smell.   Care of the nephrostomy tube and drainage bag Always keep the tubing, the leg bag, or the bedside drainage bags below the level of the kidney so that your urine drains freely. When connecting your nephrostomy tube to a drainage bag, make sure that there are no kinks in the tubing and that your urine is draining freely. You may want to use an elastic bandage to wrap any exposed tubing that goes from the nephrostomy tube to any of the connecting tubes. At night, you may want to connect your nephrostomy tube or the leg bag to a larger bedside drainage bag. Follow instructions from your health care provider about how to empty or change the drainage bag. Empty the drainage bag when it becomes ? full. Replace the drainage bag and any extension tubing that is connected to your nephrostomy tube every 7 days or as told by your health care provider. Your health care provider will explain how to change the drainage bag and extension tubing. General instructions Take over-the-counter and prescription medicines only as told by your health care provider. Keep all follow-up visits as told by your health care provider. This is important. The nephrostomy tube will need to be changed every 8-12 weeks.  **Follow up for routine nephrostomy tube exchange in 8 week(s). With [facility]. Contact a health  care provider if: You have problems with any of the valves or tubing. You have persistent pain or soreness in your back. You have redness, swelling, or pain around your tube insertion site. You have fluid or blood coming from your tube insertion site. Your tube insertion site feels warm to the touch. You have pus or a bad smell coming from your tube insertion site. You have  increased urine output or you feel burning when urinating. Get help right away if: You have pain in your abdomen during the first week. You have chest pain or have trouble breathing. You have a new appearance of blood in your urine. You have a fever or chills. You have back pain that is not relieved by your medicine. You have decreased urine output. Your nephrostomy tube comes out. Summary After the procedure, it is common to have some soreness where the nephrostomy tube was inserted (tube insertion site). Follow instructions from your health care provider about how to take care of your tube insertion site, nephrostomy tube, and drainage bag. Keep all follow-up visits for care and for changing the tube. This information is not intended to replace advice given to you by your health care provider. Make sure you discuss any questions you have with your healthcare provider. Document Revised: 07/13/2019 Document Reviewed: 07/13/2019 Elsevier Patient Education  2022 Elsevier Inc.   Moderate Conscious Sedation, Adult, Care After  This sheet gives you information about how to care for yourself after your procedure. Your health care provider may also give you more specific instructions. If you have problems or questions, contact your health care provider. What can I expect after the procedure? After the procedure, it is common to have: Sleepiness for several hours. Impaired judgment for several hours. Difficulty with balance. Vomiting if you eat too soon. Follow these instructions at home: For the time period you were told by your health care provider: Rest. Do not participate in activities where you could fall or become injured. Do not drive or use machinery. Do not drink alcohol. Do not take sleeping pills or medicines that cause drowsiness. Do not make important decisions or sign legal documents. Do not take care of children on your own.      Eating and drinking Follow the diet  recommended by your health care provider. Drink enough fluid to keep your urine pale yellow. If you vomit: Drink water , juice, or soup when you can drink without vomiting. Make sure you have little or no nausea before eating solid foods.   General instructions Take over-the-counter and prescription medicines only as told by your health care provider. Have a responsible adult stay with you for the time you are told. It is important to have someone help care for you until you are awake and alert. Do not smoke. Keep all follow-up visits as told by your health care provider. This is important. Contact a health care provider if: You are still sleepy or having trouble with balance after 24 hours. You feel light-headed. You keep feeling nauseous or you keep vomiting. You develop a rash. You have a fever. You have redness or swelling around the IV site. Get help right away if: You have trouble breathing. You have new-onset confusion at home. Summary After the procedure, it is common to feel sleepy, have impaired judgment, or feel nauseous if you eat too soon. Rest after you get home. Know the things you should not do after the procedure. Follow the diet recommended by your health care provider  and drink enough fluid to keep your urine pale yellow. Get help right away if you have trouble breathing or new-onset confusion at home. This information is not intended to replace advice given to you by your health care provider. Make sure you discuss any questions you have with your health care provider. Document Revised: 10/15/2019 Document Reviewed: 05/13/2019 Elsevier Patient Education  2021 [employer].

## 2024-04-02 ENCOUNTER — Telehealth: Payer: Self-pay | Admitting: Urology

## 2024-04-02 NOTE — Telephone Encounter (Signed)
 Verbal from Dr. Sherrilee fro pt to get tegaderm to prevent itching. Pt is made she can get tegaderm at any pharmacy. Verbalized understanding

## 2024-04-02 NOTE — Telephone Encounter (Signed)
 Patient called into the office today with general questions/concerns regarding had neph tube placed and tape is making her itch. Patient may be reached at [phone] to discuss questions.

## 2024-04-06 ENCOUNTER — Encounter: Payer: Self-pay | Admitting: Family Medicine

## 2024-04-06 ENCOUNTER — Ambulatory Visit (INDEPENDENT_AMBULATORY_CARE_PROVIDER_SITE_OTHER): Admitting: Family Medicine

## 2024-04-06 VITALS — BP 120/60 | HR 56 | Temp 98.4°F | Ht 64.0 in | Wt 144.0 lb

## 2024-04-06 DIAGNOSIS — N135 Crossing vessel and stricture of ureter without hydronephrosis: Secondary | ICD-10-CM | POA: Diagnosis not present

## 2024-04-06 LAB — CBC WITH DIFFERENTIAL/PLATELET
Absolute Lymphocytes: 2405 {cells}/uL (ref 850–3900)
Absolute Monocytes: 709 {cells}/uL (ref 200–950)
Basophils Absolute: 39 {cells}/uL (ref 0–200)
Basophils Relative: 0.6 %
Eosinophils Absolute: 221 {cells}/uL (ref 15–500)
Eosinophils Relative: 3.4 %
HCT: 38.3 % (ref 35.0–45.0)
Hemoglobin: 12.8 g/dL (ref 11.7–15.5)
MCH: 29.5 pg (ref 27.0–33.0)
MCHC: 33.4 g/dL (ref 32.0–36.0)
MCV: 88.2 fL (ref 80.0–100.0)
MPV: 10.3 fL (ref 7.5–12.5)
Monocytes Relative: 10.9 %
Neutro Abs: 3127 {cells}/uL (ref 1500–7800)
Neutrophils Relative %: 48.1 %
Platelets: 201 Thousand/uL (ref 140–400)
RBC: 4.34 Million/uL (ref 3.80–5.10)
RDW: 12.8 % (ref 11.0–15.0)
Total Lymphocyte: 37 %
WBC: 6.5 Thousand/uL (ref 3.8–10.8)

## 2024-04-06 LAB — BASIC METABOLIC PANEL WITHOUT GFR
BUN/Creatinine Ratio: 17 (calc) (ref 6–22)
BUN: 22 mg/dL (ref 7–25)
CO2: 27 mmol/L (ref 20–32)
Calcium: 10.2 mg/dL (ref 8.6–10.4)
Chloride: 103 mmol/L (ref 98–110)
Creat: 1.33 mg/dL — ABNORMAL HIGH (ref 0.60–0.95)
Glucose, Bld: 96 mg/dL (ref 65–99)
Potassium: 4.8 mmol/L (ref 3.5–5.3)
Sodium: 137 mmol/L (ref 135–146)

## 2024-04-06 NOTE — Progress Notes (Signed)
 .    Subjective:    Patient ID: Erika Lee, female    DOB: Apr 23, 1946, 78 y.o.   MRN: [id number] Patient recently was admitted to the hospital and had interventional radiology place a nephrostomy tube into her right ureter.  The patient has a leg bag.  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  Patient has a follow-up appointment with her urologist in November.  The output of the nephrostomy tube is starting to clear.  It was pure blood the first few days.  It is now blood-tinged urine.  She denies any back pain or fever.  There is no erythema around the nephrostomy tube site.  The skin is well-healed.  There is no discharge.  She is doing an Artist job of keeping it clean.  Past Medical History:  Diagnosis Date   Anticoagulant long-term use    eliquis    Chronic kidney disease    Chronic sinusitis    Dysrhythmia    A.fib,   GERD (gastroesophageal reflux disease)    Heart failure with preserved ejection fraction (HCC)    Hemorrhoids    History of colitis 10/2016   campylobacter   Hyperlipidemia    Hypertension    Multinodular goiter    per pt has had 2 biopsy's both benign   NSTEMI (non-ST elevated myocardial infarction) (HCC)    12/2018- normal coronary arteries on cath, poss coronary vasospasm.    Osteoarthritis    all over  and [facility] joint left shoulder   Paroxysmal atrial fibrillation [facility]) EP cardiologist--  dr kelsie   s/p PVI by Dr kelsie 07/16/2013;   pt first dx 2008,  recurrence 2014 afib/flutter and tachy-brady   Rotator cuff tear, left    Shoulder impingement, left    Status post placement of implantable loop recorder    original placement 07-26-2014;  removal / replacement 10-17-2017  by dr allred   Type 2 diabetes mellitus (HCC)    followed by pcp   Past Surgical History:  Procedure Laterality Date   ATRIAL FIBRILLATION ABLATION N/A 07/16/2013   PVI and CTI ablation by Dr kelsie   CARDIAC CATHETERIZATION      01/11/2019- Normal coronary arteries.    COLONOSCOPY  09/10/2011   Dr. Mavis: normal, 10 year follow up.   CYSTOSCOPY W/ URETERAL STENT PLACEMENT Right 10/22/2023   Procedure: CYSTOSCOPY, WITH RETROGRADE PYELOGRAM, RIGHT URETEROSCOPY, PERFORMANCE OF RIGHT RENAL WASHINGS AND URETERAL STENT INSERTION;  Surgeon: Matilda Senior, MD;  Location: [facility];  Service: Urology;  Laterality: Right;   CYSTOSCOPY WITH RETROGRADE PYELOGRAM, URETEROSCOPY AND STENT PLACEMENT Right 02/26/2024   Procedure: DIAGNOSTIC URETEROSCOPY, RETROGRADE PYELOGRAM.;  Surgeon: Sherrilee Belvie CROME, MD;  Location: [facility];  Service: Urology;  Laterality: Right;   implantable loop recorder removal  04/25/2021   MDT LINQ removed by Dr kelsie   IR NEPHROSTOMY PLACEMENT RIGHT  03/31/2024   LEFT HEART CATH AND CORONARY ANGIOGRAPHY N/A 01/11/2019   Procedure: LEFT HEART CATH AND CORONARY ANGIOGRAPHY;  Surgeon: Court Dorn PARAS, MD;  Location: [facility];  Service: Cardiovascular;  Laterality: N/A;   LESION REMOVAL N/A 05/03/2013   Procedure: EXCISION CYST, BACK;  Surgeon: Oneil DELENA Mavis, MD;  Location: [facility];  Service: General;  Laterality: N/A;   LOOP RECORDER IMPLANT N/A 07/26/2014   Procedure: LOOP RECORDER IMPLANT;  Surgeon: Lynwood kelsie, MD;  Location: [facility] CATH LAB;  Service: Cardiovascular;  Laterality: N/A;   LOOP RECORDER INSERTION  N/A 10/17/2017   MDT previously implanted ILR for afib management at [facility].  old device removed and new device placed by Dr Kelsie   LOOP RECORDER REMOVAL N/A 10/17/2017   MDT ILR removed with new device subsequently replaced   SHOULDER ARTHROSCOPY WITH ROTATOR CUFF REPAIR AND SUBACROMIAL DECOMPRESSION Left 01/07/2019   Procedure: Left shoulder subacromial decompression, distal clavicle resection, extensive debridement,;  Surgeon: Sharl Selinda Dover, MD;  Location: [facility];  Service: Orthopedics;  Laterality: Left;   TEE WITHOUT CARDIOVERSION N/A 07/15/2013   Procedure:  TRANSESOPHAGEAL ECHOCARDIOGRAM (TEE);  Surgeon: Redell GORMAN Shallow, MD;  Location: [facility] ENDOSCOPY;  Service: Cardiovascular;  Laterality: N/A;   TONSILLECTOMY  age 55   TRANSANAL HEMORRHOIDAL DEARTERIALIZATION N/A 09/22/2015   Procedure: TRANSANAL HEMORRHOIDAL LIGATION/PEXY EUA POSSIBLE HEMORRHOIDECTOMY ;  Surgeon: Elspeth Schultze, MD;  Location: [facility];  Service: General;  Laterality: N/A;   URETERAL BIOPSY Right 02/26/2024   Procedure: BIOPSY, URETER;  Surgeon: Sherrilee Dover CROME, MD;  Location: [facility];  Service: Urology;  Laterality: Right;   Current Outpatient Medications on File Prior to Visit  Medication Sig Dispense Refill   amLODipine  (NORVASC ) 5 MG tablet Take 1 tablet (5 mg total) by mouth daily. 90 tablet 3   apixaban  (ELIQUIS ) 5 MG TABS tablet Take 1 tablet (5 mg total) by mouth 2 (two) times daily. 180 tablet 3   azelastine  (ASTELIN ) 0.1 % nasal spray Place 1 spray into both nostrils 2 (two) times daily. Use in each nostril as directed- helps with postnasal drip 30 mL 1   diazepam  (VALIUM ) 5 MG tablet TAKE 1 TABLET(5 MG) BY MOUTH AT BEDTIME AS NEEDED FOR SLEEP 30 tablet 3   estradiol  (ESTRACE ) 0.1 MG/GM vaginal cream Place 1 Applicatorful vaginally at bedtime. 42.5 g 12   glucose blood (FREESTYLE PRECISION NEO TEST) test strip DX: E11.9 Use to check blood sugar daily 100 strip 5   HYDROcodone -acetaminophen  (NORCO) 10-325 MG tablet Take 1 tablet by mouth every 6 (six) hours as needed. 120 tablet 0   Lancets (FREESTYLE) lancets Use to check blood sugar daily. 100 each 5   lisinopril  (ZESTRIL ) 40 MG tablet Take 1 tablet (40 mg total) by mouth daily. 90 tablet 3   mometasone  (ELOCON ) 0.1 % cream Apply topically daily. 15 g 1   ondansetron  (ZOFRAN ) 4 MG tablet Take 1 tablet (4 mg total) by mouth every 8 (eight) hours as needed for nausea or vomiting. 30 tablet 0   pantoprazole  (PROTONIX ) 40 MG tablet Take 1 tablet (40 mg total) by mouth daily. 90 tablet 3   predniSONE  (DELTASONE ) 20 MG tablet 3  tabs poqday 1-2, 2 tabs poqday 3-4, 1 tab poqday 5-6 12 tablet 0   promethazine  (PHENERGAN ) 12.5 MG tablet Take 1 tablet (12.5 mg total) by mouth every 6 (six) hours as needed for nausea or vomiting. 30 tablet 3   simvastatin  (ZOCOR ) 40 MG tablet Take 1 tablet (40 mg total) by mouth daily at 6 PM. 90 tablet 2   spironolactone  (ALDACTONE ) 25 MG tablet Take 0.5 tablets (12.5 mg total) by mouth daily. 90 tablet 3   tamsulosin (FLOMAX) 0.4 MG CAPS capsule Take by mouth.     No current facility-administered medications on file prior to visit.     Allergies  Allergen Reactions   Clindamycin /Lincomycin Rash   Doxycycline Other (See Comments)    Makes heart race   Keflex  [Cephalexin ] Nausea And Vomiting   Sulfonic Acid (3,5-Dibromo-4-H-Ox-Benz) Other (See Comments)    Unknown  Adhesive [Tape] Rash   Latex Rash   Sulfonamide Derivatives Nausea And Vomiting   Social History   Socioeconomic History   Marital status: Married    Spouse name: Garen Dame   Number of children: 2   Years of education: 12   Highest education level: 12th grade  Occupational History   Occupation: Advertising copywriter: RETIRED    Comment: Full time  Tobacco Use   Smoking status: Former    Current packs/day: 0.00    Types: Cigarettes    Start date: 01/03/1960    Quit date: 01/02/1994    Years since quitting: 30.2    Passive exposure: Past   Smokeless tobacco: Never  Vaping Use   Vaping status: Never Used  Substance and Sexual Activity   Alcohol use: No   Drug use: No   Sexual activity: Not Currently    Partners: Male  Other Topics Concern   Not on file  Social History Narrative   One child deceased from homicide at age 61    Social Drivers of Health   Financial Resource Strain: Low Risk  (05/21/2022)   Overall Financial Resource Strain (CARDIA)    Difficulty of Paying Living Expenses: Not hard at all  Food Insecurity: No Food Insecurity (10/23/2023)   Hunger Vital Sign    Worried  About Running Out of Food in the Last Year: Never true    Ran Out of Food in the Last Year: Never true  Transportation Needs: No Transportation Needs (10/23/2023)   PRAPARE - Administrator, Civil Service (Medical): No    Lack of Transportation (Non-Medical): No  Physical Activity: Inactive (05/21/2022)   Exercise Vital Sign    Days of Exercise per Week: 0 days    Minutes of Exercise per Session: 0 min  Stress: Stress Concern Present (05/21/2022)   [employer] of Occupational Health - Occupational Stress Questionnaire    Feeling of Stress : To some extent  Social Connections: Moderately Isolated (10/23/2023)   Social Connection and Isolation Panel    Frequency of Communication with Friends and Family: More than three times a week    Frequency of Social Gatherings with Friends and Family: Once a week    Attends Religious Services: 1 to 4 times per year    Active Member of [employer] or Organizations: No    Attends Banker Meetings: Never    Marital Status: Widowed  Intimate Partner Violence: Not At Risk (10/23/2023)   Humiliation, Afraid, Rape, and Kick questionnaire    Fear of Current or Ex-Partner: No    Emotionally Abused: No    Physically Abused: No    Sexually Abused: No     Review of Systems  All other systems reviewed and are negative.      Objective:   Physical Exam Vitals reviewed.  Constitutional:      General: She is not in acute distress.    Appearance: Normal appearance. She is not ill-appearing or toxic-appearing.  Cardiovascular:     Rate and Rhythm: Normal rate and regular rhythm.     Pulses: Normal pulses.     Heart sounds: Normal heart sounds. No murmur heard.    No friction rub. No gallop.  Pulmonary:     Effort: Pulmonary effort is normal. No respiratory distress.     Breath sounds: No stridor. No wheezing, rhonchi or rales.  Abdominal:     General: Abdomen is flat. Bowel sounds are normal. There is  no distension.      Palpations: Abdomen is soft.     Tenderness: There is no abdominal tenderness. There is no guarding.  Musculoskeletal:     Cervical back: Neck supple.       Back:  Neurological:     General: No focal deficit present.     Mental Status: She is alert and oriented to person, place, and time.     Cranial Nerves: No cranial nerve deficit.     Sensory: No sensory deficit.     Motor: No weakness.     Coordination: Coordination normal.     Gait: Gait normal.     Deep Tendon Reflexes: Reflexes normal.  Psychiatric:        Mood and Affect: Mood normal.        Thought Content: Thought content normal.    Red circle indicates the location of the percutaneous nephrostomy tube       Assessment & Plan:  Obstruction of right ureter - Plan: CBC with Differential/Platelet, Basic Metabolic Panel Without GFR  We will check a CBC today to look for any evidence of leukocytosis or significant drop in her hemoglobin.  I will also monitor her renal function.  I anticipate that this should be improving now that the obstruction has been redirected.  I recommended that the patient discuss with her urologist for long-term plan about the nephrostomy tube and whether they are going to try to divert this to her bladder.  Patient has questions that I do not feel I am able to answer competently regarding that

## 2024-04-08 ENCOUNTER — Ambulatory Visit: Payer: Self-pay | Admitting: Family Medicine

## 2024-04-14 ENCOUNTER — Telehealth: Payer: Self-pay

## 2024-04-14 NOTE — Telephone Encounter (Signed)
 Return call to pt and state's every tape she use cause her to itch with nepho tube. Pt state's she brought Tegaderm paper and that is causing her to itch also and she wants to know if there anything that she can put around the tube to prevent the itching. Pt is aware a message will be sent to MD on advisement.

## 2024-04-29 ENCOUNTER — Other Ambulatory Visit: Payer: Self-pay | Admitting: Family Medicine

## 2024-04-30 DIAGNOSIS — M25511 Pain in right shoulder: Secondary | ICD-10-CM | POA: Insufficient documentation

## 2024-05-04 ENCOUNTER — Other Ambulatory Visit: Payer: Self-pay

## 2024-05-04 ENCOUNTER — Telehealth: Payer: Self-pay

## 2024-05-04 MED ORDER — HYDROCODONE-ACETAMINOPHEN 10-325 MG PO TABS: 1.0000 | ORAL_TABLET | Freq: Four times a day (QID) | ORAL | 0 refills | Status: DC | PRN

## 2024-05-04 NOTE — Telephone Encounter (Signed)
 Medication sent to provider for review and to sign.

## 2024-05-04 NOTE — Telephone Encounter (Signed)
 Pt called in to request a refill of this med HYDROcodone -acetaminophen  (NORCO) 10-325 MG tablet [[id number]]. Please advise  LOV 04/06/24  PHARMACY: [facility]  CB#: [phone]

## 2024-05-10 ENCOUNTER — Ambulatory Visit: Admitting: Urology

## 2024-05-10 VITALS — BP 137/71 | HR 57

## 2024-05-10 DIAGNOSIS — N1330 Unspecified hydronephrosis: Secondary | ICD-10-CM | POA: Diagnosis not present

## 2024-05-10 NOTE — Progress Notes (Unsigned)
 05/10/2024 11:04 AM   Erika Lee 24-Mar-1946 [id number]  Referring provider: Duanne Butler DASEN, MD [address] [place],  [place] [address]  Right hydronephrosis   HPI: Erika Lee is a 78yo here for followup for right ureteral stricture. She underwent right nephrostomy tube placement 04/06/24 with interventional radiology. Contrast did not flow past the mid ureter. Her right kidney makes several ounces of urine daily.    PMH: Past Medical History:  Diagnosis Date   Anticoagulant long-term use    eliquis    Chronic kidney disease    Chronic sinusitis    Dysrhythmia    A.fib,   GERD (gastroesophageal reflux disease)    Heart failure with preserved ejection fraction (HCC)    Hemorrhoids    History of colitis 10/2016   campylobacter   Hyperlipidemia    Hypertension    Multinodular goiter    per pt has had 2 biopsy's both benign   NSTEMI (non-ST elevated myocardial infarction) (HCC)    12/2018- normal coronary arteries on cath, poss coronary vasospasm.    Osteoarthritis    all over  and [facility] joint left shoulder   Paroxysmal atrial fibrillation [facility]) EP cardiologist--  dr kelsie   s/p PVI by Dr Kelsie 07/16/2013;   pt first dx 2008,  recurrence 2014 afib/flutter and tachy-brady   Rotator cuff tear, left    Shoulder impingement, left    Status post placement of implantable loop recorder    original placement 07-26-2014;  removal / replacement 10-17-2017  by dr allred   Type 2 diabetes mellitus (HCC)    followed by pcp    Surgical History: Past Surgical History:  Procedure Laterality Date   ATRIAL FIBRILLATION ABLATION N/A 07/16/2013   PVI and CTI ablation by Dr Kelsie   CARDIAC CATHETERIZATION     01/11/2019- Normal coronary arteries.    COLONOSCOPY  09/10/2011   Dr. Mavis: normal, 10 year follow up.   CYSTOSCOPY W/ URETERAL STENT PLACEMENT Right 10/22/2023   Procedure: CYSTOSCOPY, WITH RETROGRADE PYELOGRAM, RIGHT URETEROSCOPY, PERFORMANCE OF RIGHT RENAL  WASHINGS AND URETERAL STENT INSERTION;  Surgeon: Matilda Senior, MD;  Location: [facility];  Service: Urology;  Laterality: Right;   CYSTOSCOPY WITH RETROGRADE PYELOGRAM, URETEROSCOPY AND STENT PLACEMENT Right 02/26/2024   Procedure: DIAGNOSTIC URETEROSCOPY, RETROGRADE PYELOGRAM.;  Surgeon: Sherrilee Belvie CROME, MD;  Location: [facility];  Service: Urology;  Laterality: Right;   implantable loop recorder removal  04/25/2021   MDT LINQ removed by Dr Kelsie   IR NEPHROSTOMY PLACEMENT RIGHT  03/31/2024   LEFT HEART CATH AND CORONARY ANGIOGRAPHY N/A 01/11/2019   Procedure: LEFT HEART CATH AND CORONARY ANGIOGRAPHY;  Surgeon: Court Dorn PARAS, MD;  [facility]: [facility];  Service: Cardiovascular;  Laterality: N/A;   LESION REMOVAL N/A 05/03/2013   Procedure: EXCISION CYST, BACK;  Surgeon: Oneil DELENA Mavis, MD;  Location: [facility];  Service: General;  Laterality: N/A;   LOOP RECORDER IMPLANT N/A 07/26/2014   Procedure: LOOP RECORDER IMPLANT;  Surgeon: Lynwood Kelsie, MD;  Location: [facility] CATH LAB;  Service: Cardiovascular;  Laterality: N/A;   LOOP RECORDER INSERTION N/A 10/17/2017   MDT previously implanted ILR for afib management at [facility].  old device removed and new device placed by Dr Kelsie   LOOP RECORDER REMOVAL N/A 10/17/2017   MDT ILR removed with new device subsequently replaced   SHOULDER ARTHROSCOPY WITH ROTATOR CUFF REPAIR AND SUBACROMIAL DECOMPRESSION Left 01/07/2019   Procedure: Left shoulder subacromial decompression, distal clavicle resection, extensive  debridement,;  Surgeon: Sharl Selinda Dover, MD;  Location: [facility];  Service: Orthopedics;  Laterality: Left;   TEE WITHOUT CARDIOVERSION N/A 07/15/2013   Procedure: TRANSESOPHAGEAL ECHOCARDIOGRAM (TEE);  Surgeon: Redell GORMAN Shallow, MD;  Location: [facility] ENDOSCOPY;  Service: Cardiovascular;  Laterality: N/A;   TONSILLECTOMY  age 69   TRANSANAL HEMORRHOIDAL DEARTERIALIZATION N/A 09/22/2015   Procedure: TRANSANAL HEMORRHOIDAL  LIGATION/PEXY EUA POSSIBLE HEMORRHOIDECTOMY ;  Surgeon: Elspeth Schultze, MD;  Location: [facility];  Service: General;  Laterality: N/A;   URETERAL BIOPSY Right 02/26/2024   Procedure: BIOPSY, URETER;  Surgeon: Sherrilee Dover CROME, MD;  Location: [facility];  Service: Urology;  Laterality: Right;    Home Medications:  Allergies as of 05/10/2024       Reactions   Clindamycin /lincomycin Rash   Doxycycline Other (See Comments)   Makes heart race   Keflex  [cephalexin ] Nausea And Vomiting   Sulfonic Acid (3,5-dibromo-4-h-ox-benz) Other (See Comments)   Unknown    Adhesive [tape] Rash   Latex Rash   Sulfonamide Derivatives Nausea And Vomiting        Medication List        Accurate as of May 10, 2024 11:04 AM. If you have any questions, ask your nurse or doctor.          amLODipine  5 MG tablet Commonly known as: NORVASC  Take 1 tablet (5 mg total) by mouth daily.   apixaban  5 MG Tabs tablet Commonly known as: Eliquis  Take 1 tablet (5 mg total) by mouth 2 (two) times daily.   azelastine  0.1 % nasal spray Commonly known as: ASTELIN  Place 1 spray into both nostrils 2 (two) times daily. Use in each nostril as directed- helps with postnasal drip   diazepam  5 MG tablet Commonly known as: VALIUM  TAKE 1 TABLET(5 MG) BY MOUTH AT BEDTIME AS NEEDED FOR SLEEP   estradiol  0.1 MG/GM vaginal cream Commonly known as: ESTRACE  Place 1 Applicatorful vaginally at bedtime.   freestyle lancets Use to check blood sugar daily.   FreeStyle Precision Neo Test test strip Generic drug: glucose blood DX: E11.9 Use to check blood sugar daily   HYDROcodone -acetaminophen  10-325 MG tablet Commonly known as: NORCO Take 1 tablet by mouth every 6 (six) hours as needed.   lisinopril  40 MG tablet Commonly known as: ZESTRIL  Take 1 tablet (40 mg total) by mouth daily.   mometasone  0.1 % cream Commonly known as: ELOCON  Apply topically daily.   ondansetron  4 MG tablet Commonly known as: Zofran  Take 1  tablet (4 mg total) by mouth every 8 (eight) hours as needed for nausea or vomiting.   pantoprazole  40 MG tablet Commonly known as: PROTONIX  Take 1 tablet (40 mg total) by mouth daily.   predniSONE  20 MG tablet Commonly known as: DELTASONE  3 tabs poqday 1-2, 2 tabs poqday 3-4, 1 tab poqday 5-6   promethazine  12.5 MG tablet Commonly known as: PHENERGAN  Take 1 tablet (12.5 mg total) by mouth every 6 (six) hours as needed for nausea or vomiting.   simvastatin  40 MG tablet Commonly known as: ZOCOR  Take 1 tablet (40 mg total) by mouth daily at 6 PM.   spironolactone  25 MG tablet Commonly known as: ALDACTONE  Take 0.5 tablets (12.5 mg total) by mouth daily.   tamsulosin 0.4 MG Caps capsule Commonly known as: FLOMAX Take by mouth.        Allergies:  Allergies  Allergen Reactions   Clindamycin /Lincomycin Rash   Doxycycline Other (See Comments)    Makes heart race  Keflex  [Cephalexin ] Nausea And Vomiting   Sulfonic Acid (3,5-Dibromo-4-H-Ox-Benz) Other (See Comments)    Unknown    Adhesive [Tape] Rash   Latex Rash   Sulfonamide Derivatives Nausea And Vomiting    Family History: Family History  Problem Relation Age of Onset   Stroke Mother 27   Stroke Maternal Grandmother        stroke   Other Maternal Grandfather        heart dropsy kidney issues   Kidney disease Maternal Grandfather    Colon cancer Neg Hx     Social History:  reports that she quit smoking about 30 years ago. Her smoking use included cigarettes. She started smoking about 64 years ago. She has been exposed to tobacco smoke. She has never used smokeless tobacco. She reports that she does not drink alcohol and does not use drugs.  ROS: All other review of systems were reviewed and are negative except what is noted above in HPI  Physical Exam: BP 137/71   Pulse (!) 57   LMP  (LMP Unknown)   Constitutional:  Alert and oriented, No acute distress. HEENT:  AT, moist mucus membranes.  Trachea  midline, no masses. Cardiovascular: No clubbing, cyanosis, or edema. Respiratory: Normal respiratory effort, no increased work of breathing. GI: Abdomen is soft, nontender, nondistended, no abdominal masses GU: No CVA tenderness.  Lymph: No cervical or inguinal lymphadenopathy. Skin: No rashes, bruises or suspicious lesions. Neurologic: Grossly intact, no focal deficits, moving all 4 extremities. Psychiatric: Normal mood and affect.  Laboratory Data: Lab Results  Component Value Date   WBC 6.5 04/06/2024   HGB 12.8 04/06/2024   HCT 38.3 04/06/2024   MCV 88.2 04/06/2024   PLT 201 04/06/2024    Lab Results  Component Value Date   CREATININE 1.33 (H) 04/06/2024    No results found for: PSA  No results found for: TESTOSTERONE  Lab Results  Component Value Date   HGBA1C 6.0 (H) 01/22/2024    Urinalysis    Component Value Date/Time   COLORURINE YELLOW 03/31/2024 1053   APPEARANCEUR CLOUDY (A) 03/31/2024 1053   APPEARANCEUR Clear 03/17/2024 1420   LABSPEC >1.046 (H) 03/31/2024 1053   PHURINE 7.0 03/31/2024 1053   GLUCOSEU NEGATIVE 03/31/2024 1053   HGBUR LARGE (A) 03/31/2024 1053   BILIRUBINUR NEGATIVE 03/31/2024 1053   BILIRUBINUR Negative 03/17/2024 1420   KETONESUR NEGATIVE 03/31/2024 1053   PROTEINUR NEGATIVE 03/31/2024 1053   UROBILINOGEN 0.2 08/16/2014 1958   NITRITE NEGATIVE 03/31/2024 1053   LEUKOCYTESUR NEGATIVE 03/31/2024 1053    Lab Results  Component Value Date   LABMICR See below: 03/17/2024   WBCUA 6-10 (A) 03/17/2024   LABEPIT 0-10 03/17/2024   BACTERIA RARE (A) 03/31/2024    Pertinent Imaging: *** No results found for this or any previous visit.  No results found for this or any previous visit.  No results found for this or any previous visit.  No results found for this or any previous visit.  Results for orders placed during the hospital encounter of 11/28/23  US  RENAL  Narrative CLINICAL DATA:  Hydronephrosis of right  kidney  EXAM: RENAL / URINARY TRACT ULTRASOUND COMPLETE  COMPARISON:  10/22/2023 CT  FINDINGS: Right Kidney:  Renal measurements: 10.1 x 4.8 x 5.2 cm = volume: 132 mL. Normal echotexture. Moderate right hydronephrosis. No mass.  Left Kidney:  Renal measurements: 9.8 x 5.8 x 4.9 cm = volume: 145 mL. Echogenicity within normal limits. No mass or hydronephrosis visualized.  Bladder:  Appears normal for degree of bladder distention.  Other:  None.  IMPRESSION: Moderate right hydronephrosis.   Electronically Signed By: Franky Crease M.D. On: 12/06/2023 22:24  No results found for this or any previous visit.  Results for orders placed during the hospital encounter of 09/30/23  CT HEMATURIA WORKUP  Narrative CLINICAL DATA:  Unintentional weight loss and gross hematuria  EXAM: CT CHEST WITH CONTRAST  CT ABDOMEN AND PELVIS WITH AND WITHOUT CONTRAST  TECHNIQUE: Multidetector CT imaging of the chest was performed during intravenous contrast administration. Multidetector CT imaging of the abdomen and pelvis was performed following the standard protocol before and during bolus administration of intravenous contrast.  RADIATION DOSE REDUCTION: This exam was performed according to the departmental dose-optimization program which includes automated exposure control, adjustment of the mA and/or kV according to patient size and/or use of iterative reconstruction technique.  CONTRAST:  OMNIPAQUE  IOHEXOL  350 MG/ML SOLN  COMPARISON:  CT abdomen and pelvis dated 08/14/2023, CTA chest dated 01/09/2019  FINDINGS: CT CHEST FINDINGS  Cardiovascular: Multichamber cardiomegaly. No significant pericardial fluid/thickening. Great vessels are normal in course and caliber. No central pulmonary emboli. Coronary artery calcifications.  Mediastinum/Nodes: Enlarged, heterogeneous thyroid  gland, previously evaluated. Normal esophagus. No pathologically enlarged  axillary, supraclavicular, mediastinal, or hilar lymph nodes.  Lungs/Pleura: The central airways are patent. No suspicious pulmonary nodules. No focal consolidation. No pneumothorax. No pleural effusion.  Musculoskeletal: No acute or abnormal lytic or blastic osseous lesions. Multilevel degenerative changes of the thoracic spine.  CT ABDOMEN PELVIS FINDINGS  Hepatobiliary: Subcentimeter segment 5 hypodensity (9:21), too small to characterize. No intra or extrahepatic biliary ductal dilation. Cholelithiasis.  Pancreas: No focal lesions or main ductal dilation.  Spleen: Normal in size without focal abnormality.  Adrenals/Urinary Tract: No adrenal nodules. Similar mild-to-moderate right hydroureteronephrosis to the level of the distal right ureter, where there is abrupt cut off and enhancing soft tissue density (12:46, 13:65). Delayed right nephrogram. No suspicious renal masses or calculi. No suspicious filling defect visualized within the opacified portions of the left renal collecting system or ureter on delayed imaging. No focal bladder wall thickening.  Stomach/Bowel: Normal appearance of the stomach. No evidence of bowel wall thickening or inflammatory changes. Dilated rectum contains large volume stool. Moderate to large volume stool within the remainder of the colon. Appendix is not discretely seen.  Vascular/Lymphatic: Aortic atherosclerosis. No enlarged abdominal or pelvic lymph nodes.  Reproductive: Calcified uterine leiomyoma.  No adnexal masses.  Other: No free fluid, fluid collection, or free air.  Musculoskeletal: No acute or abnormal lytic or blastic osseous findings. Multilevel degenerative changes of the lumbar spine.  IMPRESSION: CT CHEST:  1. No acute intrathoracic abnormality. 2. Multichamber cardiomegaly. 3. Coronary artery calcifications. Assessment for potential risk factor modification, dietary therapy or pharmacologic therapy may be warranted,  if clinically indicated.  CT ABDOMEN AND PELVIS:  1. Similar mild-to-moderate right hydroureteronephrosis to the level of the distal right ureter, where there is abrupt cut off and enhancing soft tissue density, suspicious for urothelial neoplasm. Recommend further evaluation with cystoscopy. 2. Dilated rectum contains large volume stool. Moderate to large volume stool within the remainder of the colon. Recommend correlation with constipation. 3. Cholelithiasis.  Aortic Atherosclerosis (ICD10-I70.0).   Electronically Signed By: Limin  Xu M.D. On: 10/01/2023 16:21  Results for orders placed during the hospital encounter of 08/14/23  CT RENAL STONE STUDY  Narrative CLINICAL DATA:  Hematuria  EXAM: CT ABDOMEN AND PELVIS WITHOUT CONTRAST  TECHNIQUE: Multidetector CT imaging of  the abdomen and pelvis was performed following the standard protocol without IV contrast.  RADIATION DOSE REDUCTION: This exam was performed according to the departmental dose-optimization program which includes automated exposure control, adjustment of the mA and/or kV according to patient size and/or use of iterative reconstruction technique.  COMPARISON:  None Available.  FINDINGS: Lower chest: Bases clear.  No pericardial or pleural effusions.  Hepatobiliary: No hepatic parenchymal abnormalities. Gallbladder appears distended with small calcified stones or milk of calcium . No pericholecystic inflammatory changes or fluid.  Pancreas: Unremarkable. No pancreatic ductal dilatation or surrounding inflammatory changes.  Spleen: Normal in size without focal abnormality.  Adrenals/Urinary Tract: No adrenal lesions. No intrarenal stones. Right-sided hydronephrosis and hydroureter. No ureteral stones. Unremarkable urinary bladder.  Stomach/Bowel: Stomach is within normal limits. Appendix appears normal. No evidence of bowel wall thickening, distention, or inflammatory  changes.  Vascular/Lymphatic: Aortic atherosclerosis. No enlarged abdominal or pelvic lymph nodes.  Reproductive: Calcified fibroid.  Other: No abdominal wall hernia or abnormality. No abdominopelvic ascites.  Musculoskeletal: Thoracolumbosacral degenerative changes.  IMPRESSION: 1. Right-sided hydronephrosis and hydroureter. No obstructing stones identified. 2. Distended gallbladder with calcified stones or milk of calcium . 3. Calcified fibroid. 4. Aortic atherosclerosis.   Electronically Signed By: Fonda Field M.D. On: 08/30/2023 19:10   Assessment & Plan:    1. Hydronephrosis of right kidney (Primary) We discussed removal of the nephrostomy tube, continued chronic indwelling tube, and laparoscopic/open ureteral reimplant. After discussing the options the patient and son elect to continue nephrostomy tube with likely move to remove the nephrostomy tube.    No follow-ups on file.  [name], MD  [facility] [place]

## 2024-05-11 ENCOUNTER — Encounter: Payer: Self-pay | Admitting: Urology

## 2024-05-11 NOTE — Patient Instructions (Signed)
 Hydronephrosis  Hydronephrosis is the swelling of one or both kidneys due to a blockage that stops urine from flowing out of the body. Kidneys filter waste from the blood and produce urine. This condition can lead to kidney failure and may become life-threatening if not treated promptly. What are the causes? In infants and children, common causes include problems that occur when a baby is developing in the womb. These can include problems in the kidneys or in the tubes that drain urine into the bladder (ureters). In adults, common causes include: Kidney stones. Pregnancy. A tumor or cyst in the abdomen or pelvis. An enlarged prostate gland. Other causes include: Bladder infection. Scar tissue from a previous surgery or injury. A blood clot. Cancer of the prostate, bladder, uterus, ovary, or colon. What are the signs or symptoms? Symptoms of this condition include: Pain or discomfort in your side (flank) or abdomen. Swelling in your abdomen. Nausea and vomiting. Fever. Pain when passing urine. Feelings of urgency when you need to urinate. Urinating more often than normal. In some cases, you may not have any symptoms. How is this diagnosed? This condition may be diagnosed based on: Your symptoms and medical history. A physical exam. Blood and urine tests. Imaging tests, such as an ultrasound, CT scan, or MRI. A procedure to look at your urinary tract and bladder by inserting a scope into the urethra (cystoscopy). How is this treated? Treatment for this condition depends on where the blockage is, how long it has been there, and what caused it. The goal of treatment is to remove the blockage. Treatment may include: Antibiotic medicines to treat or prevent infection. A procedure to place a small, thin tube (stent) into a blocked ureter. The stent will keep the ureter open so that urine can drain through it. A nonsurgical procedure that crushes kidney stones with shock waves  (extracorporeal shock wave lithotripsy). If kidney failure occurs, treatment may include dialysis or a kidney transplant. Follow these instructions at home:  Take over-the-counter and prescription medicines only as told by your health care provider. If you were prescribed an antibiotic medicine, take it exactly as told by your health care provider. Do not stop taking the antibiotic even if you start to feel better. Rest and return to your normal activities as told by your health care provider. Ask your health care provider what activities are safe for you. Drink enough fluid to keep your urine pale yellow. Keep all follow-up visits. This is important. Contact a health care provider if: You continue to have symptoms after treatment. You develop new symptoms. Your urine becomes cloudy or bloody. You have a fever. Get help right away if: You have severe flank or abdominal pain. You cannot drink fluids without vomiting. Summary Hydronephrosis is the swelling of one or both kidneys due to a blockage that stops urine from flowing out of the body. Hydronephrosis can lead to kidney failure and may become life-threatening if not treated promptly. The goal of treatment is to remove the blockage. It may include a procedure to insert a stent into a blocked ureter, a procedure to break up kidney stones, or taking antibiotic medicines. Follow your health care provider's instructions for taking care of yourself at home, including instructions about drinking fluids, taking medicines, and limiting activities. This information is not intended to replace advice given to you by your health care provider. Make sure you discuss any questions you have with your health care provider. Document Revised: 03/18/2023 Document Reviewed: 03/18/2023 Elsevier  Patient Education  2024 [employer].

## 2024-05-19 ENCOUNTER — Ambulatory Visit

## 2024-05-26 ENCOUNTER — Other Ambulatory Visit (HOSPITAL_COMMUNITY)

## 2024-06-03 ENCOUNTER — Ambulatory Visit: Admitting: Family Medicine

## 2024-06-03 ENCOUNTER — Encounter: Payer: Self-pay | Admitting: Family Medicine

## 2024-06-03 ENCOUNTER — Other Ambulatory Visit: Payer: Self-pay | Admitting: Urology

## 2024-06-03 ENCOUNTER — Ambulatory Visit (HOSPITAL_COMMUNITY)
Admission: RE | Admit: 2024-06-03 | Discharge: 2024-06-03 | Disposition: A | Source: Ambulatory Visit | Attending: Interventional Radiology

## 2024-06-03 VITALS — BP 130/72 | HR 54 | Temp 97.9°F | Ht 64.0 in | Wt 151.0 lb

## 2024-06-03 DIAGNOSIS — Z436 Encounter for attention to other artificial openings of urinary tract: Secondary | ICD-10-CM | POA: Diagnosis not present

## 2024-06-03 DIAGNOSIS — N1330 Unspecified hydronephrosis: Secondary | ICD-10-CM

## 2024-06-03 DIAGNOSIS — M79644 Pain in right finger(s): Secondary | ICD-10-CM

## 2024-06-03 DIAGNOSIS — Z23 Encounter for immunization: Secondary | ICD-10-CM

## 2024-06-03 DIAGNOSIS — M1811 Unilateral primary osteoarthritis of first carpometacarpal joint, right hand: Secondary | ICD-10-CM

## 2024-06-03 DIAGNOSIS — E1122 Type 2 diabetes mellitus with diabetic chronic kidney disease: Secondary | ICD-10-CM

## 2024-06-03 MED ORDER — LIDOCAINE HCL 1 % IJ SOLN
20.0000 mL | Freq: Once | INTRAMUSCULAR | Status: AC
  Administered 2024-06-03: 5 mL via INTRADERMAL

## 2024-06-03 MED ORDER — IOHEXOL 300 MG/ML  SOLN
50.0000 mL | Freq: Once | INTRAMUSCULAR | Status: AC | PRN
  Administered 2024-06-03: 20 mL

## 2024-06-03 MED ORDER — LIDOCAINE HCL 1 % IJ SOLN
INTRAMUSCULAR | Status: AC
  Filled 2024-06-03: qty 20

## 2024-06-03 NOTE — Addendum Note (Signed)
 Addended by: ANGELENA RONAL BRADLEY K on: 06/03/2024 10:24 AM   Modules accepted: Orders

## 2024-06-03 NOTE — Progress Notes (Signed)
 Subjective:    Patient ID: Erika Lee, female    DOB: [birth year], 78 y.o.   MRN: [id number]  Patient is a very pleasant 78 year old Caucasian female here today complaining of pain in her right thumb.  The pain is located at the [facility] joint as well as the MCP joint.  She is tender to palpation in both of those areas.  She also has a positive Finkelstein test.  She reports pain with flexion and extension of the thumb.  The question is whether this is tenosynovitis or simply arthritis in the joints themselves.  It seems to be that she is having more pain with generalized range of motion in the joints.  She is unable to take NSAIDs due to the fact she is on Eliquis .  She is overdue for lab work to monitor her diabetes as well as her chronic kidney disease.  Her blood pressure today is excellent at 130/72.  Unfortunately she has +1 pitting edema in both legs to her knees despite taking spironolactone  Past Medical History:  Diagnosis Date  . Anticoagulant long-term use    eliquis   . Chronic kidney disease   . Chronic sinusitis   . Dysrhythmia    A.fib,  . GERD (gastroesophageal reflux disease)   . Heart failure with preserved ejection fraction (HCC)   . Hemorrhoids   . History of colitis 10/2016   campylobacter  . Hyperlipidemia   . Hypertension   . Multinodular goiter    per pt has had 2 biopsy's both benign  . NSTEMI (non-ST elevated myocardial infarction) (HCC)    12/2018- normal coronary arteries on cath, poss coronary vasospasm.   . Osteoarthritis    all over  and AC joint left shoulder  . Paroxysmal atrial fibrillation [facility]) EP cardiologist--  dr kelsie   s/p PVI by Dr Kelsie 07/16/2013;   pt first dx 2008,  recurrence 2014 afib/flutter and tachy-brady  . Rotator cuff tear, left   . Shoulder impingement, left   . Status post placement of implantable loop recorder    original placement 07-26-2014;  removal / replacement 10-17-2017  by dr allred  . Type 2 diabetes mellitus (HCC)     followed by pcp   Past Surgical History:  Procedure Laterality Date  . ATRIAL FIBRILLATION ABLATION N/A 07/16/2013   PVI and CTI ablation by Dr Kelsie  . CARDIAC CATHETERIZATION     01/11/2019- Normal coronary arteries.   . COLONOSCOPY  09/10/2011   Dr. Mavis: normal, 10 year follow up.  . CYSTOSCOPY W/ URETERAL STENT PLACEMENT Right 10/22/2023   Procedure: CYSTOSCOPY, WITH RETROGRADE PYELOGRAM, RIGHT URETEROSCOPY, PERFORMANCE OF RIGHT RENAL WASHINGS AND URETERAL STENT INSERTION;  Surgeon: Matilda Senior, MD;  Location: [facility];  Service: Urology;  Laterality: Right;  . CYSTOSCOPY WITH RETROGRADE PYELOGRAM, URETEROSCOPY AND STENT PLACEMENT Right 02/26/2024   Procedure: DIAGNOSTIC URETEROSCOPY, RETROGRADE PYELOGRAM.;  Surgeon: Sherrilee Belvie CROME, MD;  Location: [facility];  Service: Urology;  Laterality: Right;  . implantable loop recorder removal  04/25/2021   MDT LINQ removed by Dr Kelsie  . IR NEPHROSTOMY PLACEMENT RIGHT  03/31/2024  . LEFT HEART CATH AND CORONARY ANGIOGRAPHY N/A 01/11/2019   Procedure: LEFT HEART CATH AND CORONARY ANGIOGRAPHY;  Surgeon: Court Dorn PARAS, MD;  Location: [facility] INVASIVE CV LAB;  Service: Cardiovascular;  Laterality: N/A;  . LESION REMOVAL N/A 05/03/2013   Procedure: EXCISION CYST, BACK;  Surgeon: Oneil DELENA Mavis, MD;  Location: [facility];  Service: General;  Laterality: N/A;  .  LOOP RECORDER IMPLANT N/A 07/26/2014   Procedure: LOOP RECORDER IMPLANT;  Surgeon: Lynwood Rakers, MD;  Location: [facility] CATH LAB;  Service: Cardiovascular;  Laterality: N/A;  . LOOP RECORDER INSERTION N/A 10/17/2017   MDT previously implanted ILR for afib management at [facility].  old device removed and new device placed by Dr Rakers  . LOOP RECORDER REMOVAL N/A 10/17/2017   MDT ILR removed with new device subsequently replaced  . SHOULDER ARTHROSCOPY WITH ROTATOR CUFF REPAIR AND SUBACROMIAL DECOMPRESSION Left 01/07/2019   Procedure: Left shoulder subacromial decompression, distal clavicle  resection, extensive debridement,;  Surgeon: Sharl Selinda Dover, MD;  Location: [facility];  Service: Orthopedics;  Laterality: Left;  . TEE WITHOUT CARDIOVERSION N/A 07/15/2013   Procedure: TRANSESOPHAGEAL ECHOCARDIOGRAM (TEE);  Surgeon: Redell GORMAN Shallow, MD;  Location: [facility] ENDOSCOPY;  Service: Cardiovascular;  Laterality: N/A;  . TONSILLECTOMY  age 82  . TRANSANAL HEMORRHOIDAL DEARTERIALIZATION N/A 09/22/2015   Procedure: TRANSANAL HEMORRHOIDAL LIGATION/PEXY EUA POSSIBLE HEMORRHOIDECTOMY ;  Surgeon: Elspeth Schultze, MD;  Location: [facility];  Service: General;  Laterality: N/A;  . URETERAL BIOPSY Right 02/26/2024   Procedure: BIOPSY, URETER;  Surgeon: Sherrilee Dover CROME, MD;  Location: [facility];  Service: Urology;  Laterality: Right;   Current Outpatient Medications on File Prior to Visit  Medication Sig Dispense Refill  . amLODipine  (NORVASC ) 5 MG tablet Take 1 tablet (5 mg total) by mouth daily. 90 tablet 3  . apixaban  (ELIQUIS ) 5 MG TABS tablet Take 1 tablet (5 mg total) by mouth 2 (two) times daily. 180 tablet 3  . azelastine  (ASTELIN ) 0.1 % nasal spray Place 1 spray into both nostrils 2 (two) times daily. Use in each nostril as directed- helps with postnasal drip 30 mL 1  . diazepam  (VALIUM ) 5 MG tablet TAKE 1 TABLET(5 MG) BY MOUTH AT BEDTIME AS NEEDED FOR SLEEP 30 tablet 3  . estradiol  (ESTRACE ) 0.1 MG/GM vaginal cream Place 1 Applicatorful vaginally at bedtime. 42.5 g 12  . glucose blood (FREESTYLE PRECISION NEO TEST) test strip DX: E11.9 Use to check blood sugar daily 100 strip 5  . HYDROcodone -acetaminophen  (NORCO) 10-325 MG tablet Take 1 tablet by mouth every 6 (six) hours as needed. 120 tablet 0  . Lancets (FREESTYLE) lancets Use to check blood sugar daily. 100 each 5  . lisinopril  (ZESTRIL ) 40 MG tablet Take 1 tablet (40 mg total) by mouth daily. 90 tablet 3  . mometasone  (ELOCON ) 0.1 % cream Apply topically daily. 15 g 1  . ondansetron  (ZOFRAN ) 4 MG tablet Take 1 tablet  (4 mg total) by mouth every 8 (eight) hours as needed for nausea or vomiting. 30 tablet 0  . pantoprazole  (PROTONIX ) 40 MG tablet Take 1 tablet (40 mg total) by mouth daily. 90 tablet 3  . predniSONE  (DELTASONE ) 20 MG tablet 3 tabs poqday 1-2, 2 tabs poqday 3-4, 1 tab poqday 5-6 12 tablet 0  . promethazine  (PHENERGAN ) 12.5 MG tablet Take 1 tablet (12.5 mg total) by mouth every 6 (six) hours as needed for nausea or vomiting. 30 tablet 3  . simvastatin  (ZOCOR ) 40 MG tablet Take 1 tablet (40 mg total) by mouth daily at 6 PM. 90 tablet 2  . spironolactone  (ALDACTONE ) 25 MG tablet Take 0.5 tablets (12.5 mg total) by mouth daily. 90 tablet 3  . tamsulosin (FLOMAX) 0.4 MG CAPS capsule Take by mouth.     No current facility-administered medications on file prior to visit.     Allergies  Allergen Reactions  . Clindamycin /Lincomycin  Rash  . Doxycycline Other (See Comments)    Makes heart race  . Keflex  [Cephalexin ] Nausea And Vomiting  . Sulfonic Acid (3,5-Dibromo-4-H-Ox-Benz) Other (See Comments)    Unknown   . Adhesive [Tape] Rash  . Latex Rash  . Sulfonamide Derivatives Nausea And Vomiting   Social History   Socioeconomic History  . Marital status: Married    Spouse name: Garen Dame  . Number of children: 2  . Years of education: 76  . Highest education level: 12th grade  Occupational History  . Occupation: Advertising Copywriter: RETIRED    Comment: Full time  Tobacco Use  . Smoking status: Former    Current packs/day: 0.00    Types: Cigarettes    Start date: 01/03/1960    Quit date: 01/02/1994    Years since quitting: 30.4    Passive exposure: Past  . Smokeless tobacco: Never  Vaping Use  . Vaping status: Never Used  Substance and Sexual Activity  . Alcohol use: No  . Drug use: No  . Sexual activity: Not Currently    Partners: Male  Other Topics Concern  . Not on file  Social History Narrative   One child deceased from homicide at age 42    Social Drivers of  Health   Financial Resource Strain: Low Risk  (05/21/2022)   Overall Financial Resource Strain (CARDIA)   . Difficulty of Paying Living Expenses: Not hard at all  Food Insecurity: No Food Insecurity (10/23/2023)   Hunger Vital Sign   . Worried About Programme Researcher, Broadcasting/film/video in the Last Year: Never true   . Ran Out of Food in the Last Year: Never true  Transportation Needs: No Transportation Needs (10/23/2023)   PRAPARE - Transportation   . Lack of Transportation (Medical): No   . Lack of Transportation (Non-Medical): No  Physical Activity: Inactive (05/21/2022)   Exercise Vital Sign   . Days of Exercise per Week: 0 days   . Minutes of Exercise per Session: 0 min  Stress: Stress Concern Present (05/21/2022)   [employer] of Occupational Health - Occupational Stress Questionnaire   . Feeling of Stress : To some extent  Social Connections: Moderately Isolated (10/23/2023)   Social Connection and Isolation Panel   . Frequency of Communication with Friends and Family: More than three times a week   . Frequency of Social Gatherings with Friends and Family: Once a week   . Attends Religious Services: 1 to 4 times per year   . Active Member of Clubs or Organizations: No   . Attends Banker Meetings: Never   . Marital Status: Widowed  Intimate Partner Violence: Not At Risk (10/23/2023)   Humiliation, Afraid, Rape, and Kick questionnaire   . Fear of Current or Ex-Partner: No   . Emotionally Abused: No   . Physically Abused: No   . Sexually Abused: No     Review of Systems  All other systems reviewed and are negative.      Objective:   Physical Exam Vitals reviewed.  Constitutional:      General: She is not in acute distress.    Appearance: Normal appearance. She is not ill-appearing or toxic-appearing.  Cardiovascular:     Rate and Rhythm: Normal rate and regular rhythm.     Pulses: Normal pulses.     Heart sounds: Normal heart sounds. No murmur heard.    No  friction rub. No gallop.  Pulmonary:     Effort:  Pulmonary effort is normal. No respiratory distress.     Breath sounds: No stridor. No wheezing, rhonchi or rales.  Abdominal:     General: Abdomen is flat. Bowel sounds are normal. There is no distension.     Palpations: Abdomen is soft.     Tenderness: There is no abdominal tenderness. There is no guarding.  Musculoskeletal:     Cervical back: Neck supple.     Right lower leg: Edema present.     Left lower leg: Edema present.  Neurological:     General: No focal deficit present.     Mental Status: She is alert and oriented to person, place, and time.     Cranial Nerves: No cranial nerve deficit.     Sensory: No sensory deficit.     Motor: No weakness.     Coordination: Coordination normal.     Gait: Gait normal.     Deep Tendon Reflexes: Reflexes normal.  Psychiatric:        Mood and Affect: Mood normal.        Thought Content: Thought content normal.           Assessment & Plan:  Primary osteoarthritis of first carpometacarpal joint of right hand - Plan: Ambulatory referral to Orthopedic Surgery  Type 2 diabetes mellitus with stage 3 chronic kidney disease, without long-term current use of insulin , unspecified whether stage 3a or 3b CKD (HCC) - Plan: Hemoglobin A1c, CBC with Differential/Platelet, Comprehensive metabolic panel with GFR, Lipid panel, Microalbumin / creatinine urine ratio  Pain of right thumb Consult orthopedics about a possible cortisone injection in the [facility] and MCP joints of the right thumb.  Blood pressure today is excellent.  Check a urine microalbumin to creatinine ratio and if elevated consider switching spironolactone  to Kerendia.  Monitor hemoglobin A1c.  Goal hemoglobin A1c is less than 6.5.

## 2024-06-03 NOTE — Addendum Note (Signed)
 Encounter addended by: Katha Harlene ORN, RT on: 06/03/2024 4:20 PM  Actions taken: Imaging Exam ended

## 2024-06-03 NOTE — Procedures (Signed)
 Interventional Radiology Procedure:   Indications: Right ureteral obstruction  Procedure: Right nephrostomy tube exchange   Findings: New 10 Fr nephrostomy tube in renal pelvis   Complications: None     EBL: Minimal  Plan: Routine exchanges   Judithe Keetch R. Philip, MD  Pager: [phone]

## 2024-06-04 ENCOUNTER — Ambulatory Visit: Payer: Self-pay | Admitting: Family Medicine

## 2024-06-04 LAB — COMPREHENSIVE METABOLIC PANEL WITH GFR
AG Ratio: 1.9 (calc) (ref 1.0–2.5)
ALT: 7 U/L (ref 6–29)
AST: 12 U/L (ref 10–35)
Albumin: 4.4 g/dL (ref 3.6–5.1)
Alkaline phosphatase (APISO): 79 U/L (ref 37–153)
BUN/Creatinine Ratio: 15 (calc) (ref 6–22)
BUN: 18 mg/dL (ref 7–25)
CO2: 26 mmol/L (ref 20–32)
Calcium: 9.9 mg/dL (ref 8.6–10.4)
Chloride: 105 mmol/L (ref 98–110)
Creat: 1.22 mg/dL — ABNORMAL HIGH (ref 0.60–0.95)
Globulin: 2.3 g/dL (ref 1.9–3.7)
Glucose, Bld: 203 mg/dL — ABNORMAL HIGH (ref 65–99)
Potassium: 4.3 mmol/L (ref 3.5–5.3)
Sodium: 140 mmol/L (ref 135–146)
Total Bilirubin: 0.5 mg/dL (ref 0.2–1.2)
Total Protein: 6.7 g/dL (ref 6.1–8.1)
eGFR: 45 mL/min/1.73m2 — ABNORMAL LOW (ref >=60)

## 2024-06-04 LAB — CBC WITH DIFFERENTIAL/PLATELET
Absolute Lymphocytes: 1959 {cells}/uL (ref 850–3900)
Absolute Monocytes: 580 {cells}/uL (ref 200–950)
Basophils Absolute: 32 {cells}/uL (ref 0–200)
Basophils Relative: 0.5 %
Eosinophils Absolute: 107 {cells}/uL (ref 15–500)
Eosinophils Relative: 1.7 %
HCT: 38.8 % (ref 35.9–46.0)
Hemoglobin: 12.9 g/dL (ref 11.7–15.5)
MCH: 29.7 pg (ref 27.0–33.0)
MCHC: 33.2 g/dL (ref 31.6–35.4)
MCV: 89.2 fL (ref 81.4–101.7)
MPV: 10.3 fL (ref 7.5–12.5)
Monocytes Relative: 9.2 %
Neutro Abs: 3623 {cells}/uL (ref 1500–7800)
Neutrophils Relative %: 57.5 %
Platelets: 199 Thousand/uL (ref 140–400)
RBC: 4.35 Million/uL (ref 3.80–5.10)
RDW: 12.6 % (ref 11.0–15.0)
Total Lymphocyte: 31.1 %
WBC: 6.3 Thousand/uL (ref 3.8–10.8)

## 2024-06-04 LAB — HEMOGLOBIN A1C
Hgb A1c MFr Bld: 6.1 % — ABNORMAL HIGH (ref <5.7)
Mean Plasma Glucose: 128 mg/dL
eAG (mmol/L): 7.1 mmol/L

## 2024-06-04 LAB — LIPID PANEL
Cholesterol: 128 mg/dL (ref <200)
HDL: 46 mg/dL — ABNORMAL LOW (ref >=50)
LDL Cholesterol (Calc): 63 mg/dL
Non-HDL Cholesterol (Calc): 82 mg/dL (ref <130)
Total CHOL/HDL Ratio: 2.8 (calc) (ref <5.0)
Triglycerides: 109 mg/dL (ref <150)

## 2024-06-04 LAB — MICROALBUMIN / CREATININE URINE RATIO
Creatinine, Urine: 102 mg/dL (ref 20–275)
Microalb Creat Ratio: 7 mg/g{creat} (ref <30)
Microalb, Ur: 0.7 mg/dL

## 2024-06-08 ENCOUNTER — Telehealth: Payer: Self-pay

## 2024-06-08 ENCOUNTER — Telehealth: Payer: Self-pay | Admitting: Family Medicine

## 2024-06-08 NOTE — Telephone Encounter (Signed)
 Copied from CRM [id number]. Topic: General - Other >> Jun 01, 2024  2:31 PM Wess S wrote: Reason for CRM: Patient would like to speak with someone in the office about several things. Patient did not specify what she wanted to speak about   Callback #: [id number] >> Jun 08, 2024  2:00 PM Montie POUR wrote: Erika Lee is calling back to speak with Delon or Nanette about several things (she would not tell me what it was about). Please call her at [phone]. Thanks

## 2024-06-08 NOTE — Telephone Encounter (Signed)
 Copied from CRM [id number]. Topic: General - Other >> Jun 01, 2024  2:31 PM Wess S wrote: Reason for CRM: Patient would like to speak with someone in the office about several things. Patient did not specify what she wanted to speak about   Callback #: [id number]

## 2024-06-08 NOTE — Telephone Encounter (Signed)
 Prescription Request  06/08/2024  LOV: 06/03/24  What is the name of the medication or equipment? HYDROcodone -acetaminophen  (NORCO) 10-325 MG tablet [[id number]]   Have you contacted your pharmacy to request a refill? Yes   Which pharmacy would you like this sent to?  [facility] - [place], [place] - [address] [place] [address] Phone: [phone] Fax: [phone]    Patient notified that their request is being sent to the clinical staff for review and that they should receive a response within 2 business days.   Please advise at [facility] [phone]

## 2024-06-09 ENCOUNTER — Other Ambulatory Visit: Payer: Self-pay | Admitting: Family Medicine

## 2024-06-09 MED ORDER — HYDROCODONE-ACETAMINOPHEN 10-325 MG PO TABS: 1.0000 | ORAL_TABLET | Freq: Four times a day (QID) | ORAL | 0 refills | Status: DC | PRN

## 2024-06-13 ENCOUNTER — Other Ambulatory Visit: Payer: Self-pay | Admitting: Family Medicine

## 2024-06-14 DIAGNOSIS — M1811 Unilateral primary osteoarthritis of first carpometacarpal joint, right hand: Secondary | ICD-10-CM | POA: Insufficient documentation

## 2024-07-08 ENCOUNTER — Other Ambulatory Visit: Payer: Self-pay | Admitting: Family Medicine

## 2024-07-08 ENCOUNTER — Ambulatory Visit: Admitting: Family Medicine

## 2024-07-08 VITALS — BP 120/62 | HR 58 | Temp 97.8°F | Ht 64.0 in | Wt 153.2 lb

## 2024-07-08 DIAGNOSIS — R1013 Epigastric pain: Secondary | ICD-10-CM | POA: Diagnosis not present

## 2024-07-08 MED ORDER — HYDROCODONE-ACETAMINOPHEN 10-325 MG PO TABS: 1.0000 | ORAL_TABLET | Freq: Four times a day (QID) | ORAL | 0 refills | Status: AC | PRN

## 2024-07-08 MED ORDER — PROMETHAZINE HCL 12.5 MG PO TABS: 12.5000 mg | ORAL_TABLET | Freq: Four times a day (QID) | ORAL | 3 refills | Status: AC | PRN

## 2024-07-08 NOTE — Progress Notes (Signed)
 "    Subjective:    Patient ID: Erika Lee, female    DOB: 1946/03/06, 79 y.o.   MRN: [id number]  Had CT 7/25: FINDINGS: Lower chest: Cardiomegaly scattered pulmonary ground-glass micro nodules, stable to prior, for example left lung base (6/20)   Hepatobiliary: Normal size liver. Dilation of the intrahepatic bile ducts. Gallbladder is severely thick-walled. Cholelithiasis. Segment 4 subcentimeter liver lesion too small to characterize likely a cyst. Dilation of the common bile duct. Suggestion of choledocholithiasis.   Pancreas: Unremarkable. No pancreatic ductal dilatation or surrounding inflammatory changes.   Spleen: Normal in size without focal abnormality.   Adrenals/Urinary Tract: Subcentimeter bilateral renal cortical cysts which does not require imaging follow-up. Mild hydroureteronephrosis on the right without definite obstructive ureteral cause or ureteral calculus. Interval removal of the right ureteral stent. Distal right ureter is obscured by overlying bowel loops and the right ureter is distended to the level of the pelvic brim. Punctate calcification along the left kidney posteromedial cortex in the interpolar region.   Stomach/Bowel: Gastric distension is improved. Large stool is present throughout the colon suggestive of constipation. No bowel obstruction or definite mass lesion. The stomach is unremarkable.   Patient states that there has been concern about her gallbladder for years.  Her previous PCP ordered a HIDA scan in 2006 which was negative.  She has seen GI.  She has never had pain after eating.  Therefore, she never underwent a cholecystectomy.  About 2 months ago, the patient started developing nausea 2 hours after eating.  She states that if she eats a small meal such as cereal she does fine.  However if she eats a large meal, 2 hours later she will be extremely nauseated and often have to vomit.  She denies any melena.  She denies any hematochezia.   She denies any hematemesis.  She denies any biliary emesis.  She is having normal bowel movements.  She denies any constipation or bowel obstruction.  Today she has normal bowel sounds with no gastric distention.  She has no tenderness to palpation in the right upper quadrant or in the left upper quadrant.  She has been taking her heartburn medication.  She denies any severe heartburn. Past Medical History:  Diagnosis Date   Anticoagulant long-term use    eliquis    Chronic kidney disease    Chronic sinusitis    Dysrhythmia    A.fib,   GERD (gastroesophageal reflux disease)    Heart failure with preserved ejection fraction (HCC)    Hemorrhoids    History of colitis 10/2016   campylobacter   Hyperlipidemia    Hypertension    Multinodular goiter    per pt has had 2 biopsy's both benign   NSTEMI (non-ST elevated myocardial infarction) (HCC)    12/2018- normal coronary arteries on cath, poss coronary vasospasm.    Osteoarthritis    all over  and [facility] joint left shoulder   Paroxysmal atrial fibrillation [facility]) EP cardiologist--  dr kelsie   s/p PVI by Dr Kelsie 07/16/2013;   pt first dx 2008,  recurrence 2014 afib/flutter and tachy-brady   Rotator cuff tear, left    Shoulder impingement, left    Status post placement of implantable loop recorder    original placement 07-26-2014;  removal / replacement 10-17-2017  by dr allred   Type 2 diabetes mellitus (HCC)    followed by pcp   Past Surgical History:  Procedure Laterality Date   ATRIAL FIBRILLATION ABLATION N/A 07/16/2013  PVI and CTI ablation by Dr Kelsie   CARDIAC CATHETERIZATION     01/11/2019- Normal coronary arteries.    COLONOSCOPY  09/10/2011   Dr. Mavis: normal, 10 year follow up.   CYSTOSCOPY W/ URETERAL STENT PLACEMENT Right 10/22/2023   Procedure: CYSTOSCOPY, WITH RETROGRADE PYELOGRAM, RIGHT URETEROSCOPY, PERFORMANCE OF RIGHT RENAL WASHINGS AND URETERAL STENT INSERTION;  Surgeon: Matilda Senior, MD;  Location: [facility];   Service: Urology;  Laterality: Right;   CYSTOSCOPY WITH RETROGRADE PYELOGRAM, URETEROSCOPY AND STENT PLACEMENT Right 02/26/2024   Procedure: DIAGNOSTIC URETEROSCOPY, RETROGRADE PYELOGRAM.;  Surgeon: Sherrilee Belvie CROME, MD;  Location: [facility];  Service: Urology;  Laterality: Right;   implantable loop recorder removal  04/25/2021   MDT LINQ removed by Dr Kelsie   IR NEPHROSTOMY EXCHANGE RIGHT  06/03/2024   IR NEPHROSTOMY PLACEMENT RIGHT  03/31/2024   LEFT HEART CATH AND CORONARY ANGIOGRAPHY N/A 01/11/2019   Procedure: LEFT HEART CATH AND CORONARY ANGIOGRAPHY;  Surgeon: Court Dorn PARAS, MD;  Location: [facility];  Service: Cardiovascular;  Laterality: N/A;   LESION REMOVAL N/A 05/03/2013   Procedure: EXCISION CYST, BACK;  Surgeon: Oneil DELENA Mavis, MD;  Location: [facility];  Service: General;  Laterality: N/A;   LOOP RECORDER IMPLANT N/A 07/26/2014   Procedure: LOOP RECORDER IMPLANT;  Surgeon: Lynwood Kelsie, MD;  Location: [facility] CATH LAB;  Service: Cardiovascular;  Laterality: N/A;   LOOP RECORDER INSERTION N/A 10/17/2017   MDT previously implanted ILR for afib management at [facility].  old device removed and new device placed by Dr Kelsie   LOOP RECORDER REMOVAL N/A 10/17/2017   MDT ILR removed with new device subsequently replaced   SHOULDER ARTHROSCOPY WITH ROTATOR CUFF REPAIR AND SUBACROMIAL DECOMPRESSION Left 01/07/2019   Procedure: Left shoulder subacromial decompression, distal clavicle resection, extensive debridement,;  Surgeon: Sharl Selinda Belvie, MD;  Location: [facility];  Service: Orthopedics;  Laterality: Left;   TEE WITHOUT CARDIOVERSION N/A 07/15/2013   Procedure: TRANSESOPHAGEAL ECHOCARDIOGRAM (TEE);  Surgeon: Redell GORMAN Shallow, MD;  Location: [facility] ENDOSCOPY;  Service: Cardiovascular;  Laterality: N/A;   TONSILLECTOMY  age 45   TRANSANAL HEMORRHOIDAL DEARTERIALIZATION N/A 09/22/2015   Procedure: TRANSANAL HEMORRHOIDAL LIGATION/PEXY EUA POSSIBLE HEMORRHOIDECTOMY ;   Surgeon: Elspeth Schultze, MD;  Location: [facility];  Service: General;  Laterality: N/A;   URETERAL BIOPSY Right 02/26/2024   Procedure: BIOPSY, URETER;  Surgeon: Sherrilee Belvie CROME, MD;  Location: [facility];  Service: Urology;  Laterality: Right;   Current Outpatient Medications on File Prior to Visit  Medication Sig Dispense Refill   amLODipine  (NORVASC ) 5 MG tablet Take 1 tablet (5 mg total) by mouth daily. 90 tablet 3   apixaban  (ELIQUIS ) 5 MG TABS tablet Take 1 tablet (5 mg total) by mouth 2 (two) times daily. 180 tablet 3   azelastine  (ASTELIN ) 0.1 % nasal spray Place 1 spray into both nostrils 2 (two) times daily. Use in each nostril as directed- helps with postnasal drip 30 mL 1   diazepam  (VALIUM ) 5 MG tablet TAKE 1 TABLET(5 MG) BY MOUTH AT BEDTIME AS NEEDED FOR SLEEP 30 tablet 3   estradiol  (ESTRACE ) 0.1 MG/GM vaginal cream Place 1 Applicatorful vaginally at bedtime. 42.5 g 12   glucose blood (FREESTYLE PRECISION NEO TEST) test strip DX: E11.9 Use to check blood sugar daily 100 strip 5   HYDROcodone -acetaminophen  (NORCO) 10-325 MG tablet Take 1 tablet by mouth every 6 (six) hours as needed. 120 tablet 0   Lancets (FREESTYLE) lancets Use to check blood  sugar daily. 100 each 5   lisinopril  (ZESTRIL ) 40 MG tablet Take 1 tablet (40 mg total) by mouth daily. 90 tablet 3   mometasone  (ELOCON ) 0.1 % cream Apply topically daily. 15 g 1   ondansetron  (ZOFRAN ) 4 MG tablet Take 1 tablet (4 mg total) by mouth every 8 (eight) hours as needed for nausea or vomiting. 30 tablet 0   pantoprazole  (PROTONIX ) 40 MG tablet Take 1 tablet (40 mg total) by mouth daily. 90 tablet 3   predniSONE  (DELTASONE ) 20 MG tablet 3 tabs poqday 1-2, 2 tabs poqday 3-4, 1 tab poqday 5-6 12 tablet 0   promethazine  (PHENERGAN ) 12.5 MG tablet Take 1 tablet (12.5 mg total) by mouth every 6 (six) hours as needed for nausea or vomiting. 30 tablet 3   simvastatin  (ZOCOR ) 40 MG tablet Take 1 tablet (40 mg total) by mouth daily at 6 PM. 90  tablet 2   spironolactone  (ALDACTONE ) 25 MG tablet Take 0.5 tablets (12.5 mg total) by mouth daily. 90 tablet 3   tamsulosin (FLOMAX) 0.4 MG CAPS capsule Take by mouth.     No current facility-administered medications on file prior to visit.     Allergies  Allergen Reactions   Clindamycin /Lincomycin Rash   Doxycycline Other (See Comments)    Makes heart race   Keflex  [Cephalexin ] Nausea And Vomiting   Sulfonic Acid (3,5-Dibromo-4-H-Ox-Benz) Other (See Comments)    Unknown    Adhesive [Tape] Rash   Latex Rash   Sulfonamide Derivatives Nausea And Vomiting   Social History   Socioeconomic History   Marital status: Married    Spouse name: Interior And Spatial Designer   Number of children: 2   Years of education: 12   Highest education level: 12th grade  Occupational History   Occupation: Advertising Copywriter: RETIRED    Comment: Full time  Tobacco Use   Smoking status: Former    Current packs/day: 0.00    Types: Cigarettes    Start date: 01/03/1960    Quit date: 01/02/1994    Years since quitting: 30.5    Passive exposure: Past   Smokeless tobacco: Never  Vaping Use   Vaping status: Never Used  Substance and Sexual Activity   Alcohol use: No   Drug use: No   Sexual activity: Not Currently    Partners: Male  Other Topics Concern   Not on file  Social History Narrative   One child deceased from homicide at age 25    Social Drivers of Health   Tobacco Use: Medium Risk (06/03/2024)   Patient History    Smoking Tobacco Use: Former    Smokeless Tobacco Use: Never    Passive Exposure: Past  Physicist, Medical Strain: Low Risk (05/21/2022)   Overall Financial Resource Strain (CARDIA)    Difficulty of Paying Living Expenses: Not hard at all  Food Insecurity: No Food Insecurity (10/23/2023)   Hunger Vital Sign    Worried About Running Out of Food in the Last Year: Never true    Ran Out of Food in the Last Year: Never true  Transportation Needs: No Transportation Needs  (10/23/2023)   PRAPARE - Administrator, Civil Service (Medical): No    Lack of Transportation (Non-Medical): No  Physical Activity: Inactive (05/21/2022)   Exercise Vital Sign    Days of Exercise per Week: 0 days    Minutes of Exercise per Session: 0 min  Stress: Stress Concern Present (05/21/2022)   [employer] of Occupational  Health - Occupational Stress Questionnaire    Feeling of Stress : To some extent  Social Connections: Moderately Isolated (10/23/2023)   Social Connection and Isolation Panel    Frequency of Communication with Friends and Family: More than three times a week    Frequency of Social Gatherings with Friends and Family: Once a week    Attends Religious Services: 1 to 4 times per year    Active Member of [employer] or Organizations: No    Attends Banker Meetings: Never    Marital Status: Widowed  Intimate Partner Violence: Not At Risk (10/23/2023)   Humiliation, Afraid, Rape, and Kick questionnaire    Fear of Current or Ex-Partner: No    Emotionally Abused: No    Physically Abused: No    Sexually Abused: No  Depression (PHQ2-9): Low Risk (06/03/2024)   Depression (PHQ2-9)    PHQ-2 Score: 0  Alcohol Screen: Low Risk (05/21/2022)   Alcohol Screen    Last Alcohol Screening Score (AUDIT): 2  Housing: Low Risk (10/23/2023)   Housing Stability Vital Sign    Unable to Pay for Housing in the Last Year: No    Number of Times Moved in the Last Year: 0    Homeless in the Last Year: No  Utilities: Not At Risk (10/23/2023)   [facility] Utilities    Threatened with loss of utilities: No  Health Literacy: Not on file     Review of Systems  All other systems reviewed and are negative.      Objective:   Physical Exam Vitals reviewed.  Constitutional:      General: She is not in acute distress.    Appearance: Normal appearance. She is not ill-appearing or toxic-appearing.  Cardiovascular:     Rate and Rhythm: Normal rate and regular rhythm.      Pulses: Normal pulses.     Heart sounds: Normal heart sounds. No murmur heard.    No friction rub. No gallop.  Pulmonary:     Effort: Pulmonary effort is normal. No respiratory distress.     Breath sounds: No stridor. No wheezing, rhonchi or rales.  Abdominal:     General: Abdomen is flat. Bowel sounds are normal. There is no distension.     Palpations: Abdomen is soft.     Tenderness: There is no abdominal tenderness. There is no guarding.  Musculoskeletal:     Cervical back: Neck supple.  Neurological:     General: No focal deficit present.     Mental Status: She is alert and oriented to person, place, and time.     Cranial Nerves: No cranial nerve deficit.     Sensory: No sensory deficit.     Motor: No weakness.     Coordination: Coordination normal.     Gait: Gait normal.     Deep Tendon Reflexes: Reflexes normal.  Psychiatric:        Mood and Affect: Mood normal.        Thought Content: Thought content normal.           Assessment & Plan:  Dyspepsia - Plan: H. pylori breath test, CBC with Differential/Platelet, Comprehensive metabolic panel with GFR, Lipase Physical exam today is unremarkable.  Differential diagnosis includes biliary dyskinesia/chronic cholecystitis versus ulcer versus H. pylori versus gastroparesis.  I favor chronic cholecystitis based on her CT scan findings.  Her abdominal exam today is completely unremarkable.  There is no guarding or rebound or pain.  I will start by checking a  CBC a CMP and a lipase.  If her liver test are elevated or if her lipase is elevated I will recommend a HIDA scan and surgery consultation versus MRCP.  I will also check an H. pylori breath test to evaluate for the presence of H. pylori.  If all lab work is unremarkable, consider gastric emptying study to evaluate for gastroparesis.  She had a negative gastric emptying study in 2019. "

## 2024-07-09 ENCOUNTER — Ambulatory Visit: Payer: Self-pay | Admitting: Family Medicine

## 2024-07-09 ENCOUNTER — Other Ambulatory Visit: Payer: Self-pay | Admitting: Family Medicine

## 2024-07-09 DIAGNOSIS — R112 Nausea with vomiting, unspecified: Secondary | ICD-10-CM

## 2024-07-09 LAB — COMPREHENSIVE METABOLIC PANEL WITH GFR
AG Ratio: 2.2 (calc) (ref 1.0–2.5)
ALT: 9 U/L (ref 6–29)
AST: 13 U/L (ref 10–35)
Albumin: 4.3 g/dL (ref 3.6–5.1)
Alkaline phosphatase (APISO): 80 U/L (ref 37–153)
BUN/Creatinine Ratio: 20 (calc) (ref 6–22)
BUN: 27 mg/dL — ABNORMAL HIGH (ref 7–25)
CO2: 29 mmol/L (ref 20–32)
Calcium: 9.7 mg/dL (ref 8.6–10.4)
Chloride: 103 mmol/L (ref 98–110)
Creat: 1.36 mg/dL — ABNORMAL HIGH (ref 0.60–0.95)
Globulin: 2 g/dL (ref 1.9–3.7)
Glucose, Bld: 125 mg/dL — ABNORMAL HIGH (ref 65–99)
Potassium: 5.2 mmol/L (ref 3.5–5.3)
Sodium: 137 mmol/L (ref 135–146)
Total Bilirubin: 0.3 mg/dL (ref 0.2–1.2)
Total Protein: 6.3 g/dL (ref 6.1–8.1)
eGFR: 39 mL/min/1.73m2 — ABNORMAL LOW

## 2024-07-09 LAB — CBC WITH DIFFERENTIAL/PLATELET
Absolute Lymphocytes: 2007 {cells}/uL (ref 850–3900)
Absolute Monocytes: 622 {cells}/uL (ref 200–950)
Basophils Absolute: 31 {cells}/uL (ref 0–200)
Basophils Relative: 0.5 %
Eosinophils Absolute: 159 {cells}/uL (ref 15–500)
Eosinophils Relative: 2.6 %
HCT: 38.8 % (ref 35.9–46.0)
Hemoglobin: 12.5 g/dL (ref 11.7–15.5)
MCH: 28.9 pg (ref 27.0–33.0)
MCHC: 32.2 g/dL (ref 31.6–35.4)
MCV: 89.6 fL (ref 81.4–101.7)
MPV: 10.9 fL (ref 7.5–12.5)
Monocytes Relative: 10.2 %
Neutro Abs: 3282 {cells}/uL (ref 1500–7800)
Neutrophils Relative %: 53.8 %
Platelets: 188 Thousand/uL (ref 140–400)
RBC: 4.33 Million/uL (ref 3.80–5.10)
RDW: 12.2 % (ref 11.0–15.0)
Total Lymphocyte: 32.9 %
WBC: 6.1 Thousand/uL (ref 3.8–10.8)

## 2024-07-09 LAB — LIPASE: Lipase: 33 U/L (ref 7–60)

## 2024-07-09 LAB — H. PYLORI BREATH TEST: H. pylori Breath Test: NOT DETECTED

## 2024-07-16 ENCOUNTER — Other Ambulatory Visit: Payer: Self-pay

## 2024-07-16 DIAGNOSIS — R112 Nausea with vomiting, unspecified: Secondary | ICD-10-CM

## 2024-07-16 MED ORDER — METOCLOPRAMIDE HCL 10 MG PO TABS: 10.0000 mg | ORAL_TABLET | Freq: Three times a day (TID) | ORAL | 2 refills | Status: AC

## 2024-07-23 ENCOUNTER — Ambulatory Visit: Admitting: Family Medicine

## 2024-07-23 ENCOUNTER — Encounter: Payer: Self-pay | Admitting: Family Medicine

## 2024-07-23 VITALS — BP 124/72 | HR 55 | Temp 98.1°F | Ht 64.0 in | Wt 152.4 lb

## 2024-07-23 DIAGNOSIS — K3184 Gastroparesis: Secondary | ICD-10-CM | POA: Diagnosis not present

## 2024-07-23 DIAGNOSIS — Z23 Encounter for immunization: Secondary | ICD-10-CM

## 2024-07-23 NOTE — Progress Notes (Signed)
 "    Subjective:    Patient ID: Erika Lee, female    DOB: Sep 04, 1945, 79 y.o.   MRN: [id number] 07/08/24 Had CT 7/25: FINDINGS: Lower chest: Cardiomegaly scattered pulmonary ground-glass micro nodules, stable to prior, for example left lung base (6/20)   Hepatobiliary: Normal size liver. Dilation of the intrahepatic bile ducts. Gallbladder is severely thick-walled. Cholelithiasis. Segment 4 subcentimeter liver lesion too small to characterize likely a cyst. Dilation of the common bile duct. Suggestion of choledocholithiasis.   Pancreas: Unremarkable. No pancreatic ductal dilatation or surrounding inflammatory changes.   Spleen: Normal in size without focal abnormality.   Adrenals/Urinary Tract: Subcentimeter bilateral renal cortical cysts which does not require imaging follow-up. Mild hydroureteronephrosis on the right without definite obstructive ureteral cause or ureteral calculus. Interval removal of the right ureteral stent. Distal right ureter is obscured by overlying bowel loops and the right ureter is distended to the level of the pelvic brim. Punctate calcification along the left kidney posteromedial cortex in the interpolar region.   Stomach/Bowel: Gastric distension is improved. Large stool is present throughout the colon suggestive of constipation. No bowel obstruction or definite mass lesion. The stomach is unremarkable.   Patient states that there has been concern about her gallbladder for years.  Her previous PCP ordered a HIDA scan in 2006 which was negative.  Had MRCP in 2018: Hepatobiliary: No significant focal liver lesion. Borderline dilated gallbladder about 4.3 cm diameter. Indistinct hepatic steatosis.   The common hepatic duct measures up to 8 mm in diameter and the common bile duct measures up to 6 mm diameter. The no filling defect in the biliary tree is identified. No abnormal enhancement or obvious stricturing.  She has seen GI.  She has never  had pain after eating.  Therefore, she never underwent a cholecystectomy.  About 2 months ago, the patient started developing nausea 2 hours after eating.  She states that if she eats a small meal such as cereal she does fine.  However if she eats a large meal, 2 hours later she will be extremely nauseated and often have to vomit.  She denies any melena.  She denies any hematochezia.  She denies any hematemesis.  She denies any biliary emesis.  She is having normal bowel movements.  She denies any constipation or bowel obstruction.  Today she has normal bowel sounds with no gastric distention.  She has no tenderness to palpation in the right upper quadrant or in the left upper quadrant.  She has been taking her heartburn medication.  She denies any severe heartburn.  At that time, my plan was: Physical exam today is unremarkable.  Differential diagnosis includes biliary dyskinesia/chronic cholecystitis versus ulcer versus H. pylori versus gastroparesis.  I favor chronic cholecystitis based on her CT scan findings.  Her abdominal exam today is completely unremarkable.  There is no guarding or rebound or pain.  I will start by checking a CBC a CMP and a lipase.  If her liver test are elevated or if her lipase is elevated I will recommend a HIDA scan and surgery consultation versus MRCP.  I will also check an H. pylori breath test to evaluate for the presence of H. pylori.  If all lab work is unremarkable, consider gastric emptying study to evaluate for gastroparesis.  She had a negative gastric emptying study in 2019.  07/23/24 Patient's labs were completely normal.  H. pylori test was negative.  For that reason I recommended trying Reglan  10 mg prior  to meals empirically for possible gastroparesis and I suggested getting a HIDA scan to evaluate for biliary dyskinesia.  Patient states that once she started the Reglan  prior to supper, her nausea improved dramatically.  She denies any more nausea or vomiting.  She is  tolerating food without difficulty.  She states that she has felt fine in the last 2 weeks. Past Medical History:  Diagnosis Date   Anticoagulant long-term use    eliquis    Chronic kidney disease    Chronic sinusitis    Dysrhythmia    A.fib,   GERD (gastroesophageal reflux disease)    Heart failure with preserved ejection fraction (HCC)    Hemorrhoids    History of colitis 10/2016   campylobacter   Hyperlipidemia    Hypertension    Multinodular goiter    per pt has had 2 biopsy's both benign   NSTEMI (non-ST elevated myocardial infarction) (HCC)    12/2018- normal coronary arteries on cath, poss coronary vasospasm.    Osteoarthritis    all over  and [facility] joint left shoulder   Paroxysmal atrial fibrillation [facility]) EP cardiologist--  dr kelsie   s/p PVI by Dr Kelsie 07/16/2013;   pt first dx 2008,  recurrence 2014 afib/flutter and tachy-brady   Rotator cuff tear, left    Shoulder impingement, left    Status post placement of implantable loop recorder    original placement 07-26-2014;  removal / replacement 10-17-2017  by dr allred   Type 2 diabetes mellitus (HCC)    followed by pcp   Past Surgical History:  Procedure Laterality Date   ATRIAL FIBRILLATION ABLATION N/A 07/16/2013   PVI and CTI ablation by Dr Kelsie   CARDIAC CATHETERIZATION     01/11/2019- Normal coronary arteries.    COLONOSCOPY  09/10/2011   Dr. Mavis: normal, 10 year follow up.   CYSTOSCOPY W/ URETERAL STENT PLACEMENT Right 10/22/2023   Procedure: CYSTOSCOPY, WITH RETROGRADE PYELOGRAM, RIGHT URETEROSCOPY, PERFORMANCE OF RIGHT RENAL WASHINGS AND URETERAL STENT INSERTION;  Surgeon: Matilda Senior, MD;  Location: [facility];  Service: Urology;  Laterality: Right;   CYSTOSCOPY WITH RETROGRADE PYELOGRAM, URETEROSCOPY AND STENT PLACEMENT Right 02/26/2024   Procedure: DIAGNOSTIC URETEROSCOPY, RETROGRADE PYELOGRAM.;  Surgeon: Sherrilee Belvie CROME, MD;  Location: [facility];  Service: Urology;  Laterality: Right;    implantable loop recorder removal  04/25/2021   MDT LINQ removed by Dr Kelsie   IR NEPHROSTOMY EXCHANGE RIGHT  06/03/2024   IR NEPHROSTOMY PLACEMENT RIGHT  03/31/2024   LEFT HEART CATH AND CORONARY ANGIOGRAPHY N/A 01/11/2019   Procedure: LEFT HEART CATH AND CORONARY ANGIOGRAPHY;  Surgeon: Court Dorn PARAS, MD;  Location: [facility];  Service: Cardiovascular;  Laterality: N/A;   LESION REMOVAL N/A 05/03/2013   Procedure: EXCISION CYST, BACK;  Surgeon: Oneil DELENA Mavis, MD;  Location: [facility];  Service: General;  Laterality: N/A;   LOOP RECORDER IMPLANT N/A 07/26/2014   Procedure: LOOP RECORDER IMPLANT;  Surgeon: Lynwood Kelsie, MD;  [facility];  Service: Cardiovascular;  Laterality: N/A;   LOOP RECORDER INSERTION N/A 10/17/2017   MDT previously implanted ILR for afib management at [facility].  old device removed and new device placed by Dr Kelsie   LOOP RECORDER REMOVAL N/A 10/17/2017   MDT ILR removed with new device subsequently replaced   SHOULDER ARTHROSCOPY WITH ROTATOR CUFF REPAIR AND SUBACROMIAL DECOMPRESSION Left 01/07/2019   Procedure: Left shoulder subacromial decompression, distal clavicle resection, extensive debridement,;  Surgeon: Sharl Selinda Belvie, MD;  Location:  [facility];  Service: Orthopedics;  Laterality: Left;   TEE WITHOUT CARDIOVERSION N/A 07/15/2013   Procedure: TRANSESOPHAGEAL ECHOCARDIOGRAM (TEE);  Surgeon: Redell GORMAN Shallow, MD;  Location: [facility] ENDOSCOPY;  Service: Cardiovascular;  Laterality: N/A;   TONSILLECTOMY  age 72   TRANSANAL HEMORRHOIDAL DEARTERIALIZATION N/A 09/22/2015   Procedure: TRANSANAL HEMORRHOIDAL LIGATION/PEXY EUA POSSIBLE HEMORRHOIDECTOMY ;  Surgeon: Elspeth Schultze, MD;  Location: [facility];  Service: General;  Laterality: N/A;   URETERAL BIOPSY Right 02/26/2024   Procedure: BIOPSY, URETER;  Surgeon: Sherrilee Belvie CROME, MD;  Location: [facility];  Service: Urology;  Laterality: Right;   Current Outpatient Medications on File Prior to  Visit  Medication Sig Dispense Refill   amLODipine  (NORVASC ) 5 MG tablet Take 1 tablet (5 mg total) by mouth daily. 90 tablet 3   apixaban  (ELIQUIS ) 5 MG TABS tablet Take 1 tablet (5 mg total) by mouth 2 (two) times daily. 180 tablet 3   azelastine  (ASTELIN ) 0.1 % nasal spray Place 1 spray into both nostrils 2 (two) times daily. Use in each nostril as directed- helps with postnasal drip 30 mL 1   diazepam  (VALIUM ) 5 MG tablet TAKE 1 TABLET(5 MG) BY MOUTH AT BEDTIME AS NEEDED FOR SLEEP 30 tablet 3   estradiol  (ESTRACE ) 0.1 MG/GM vaginal cream Place 1 Applicatorful vaginally at bedtime. 42.5 g 12   glucose blood (FREESTYLE PRECISION NEO TEST) test strip DX: E11.9 Use to check blood sugar daily 100 strip 5   HYDROcodone -acetaminophen  (NORCO) 10-325 MG tablet Take 1 tablet by mouth every 6 (six) hours as needed. 120 tablet 0   Lancets (FREESTYLE) lancets Use to check blood sugar daily. 100 each 5   lisinopril  (ZESTRIL ) 40 MG tablet Take 1 tablet (40 mg total) by mouth daily. 90 tablet 3   metoCLOPramide  (REGLAN ) 10 MG tablet Take 1 tablet (10 mg total) by mouth 3 (three) times daily before meals. 90 tablet 2   mometasone  (ELOCON ) 0.1 % cream Apply topically daily. 15 g 1   ondansetron  (ZOFRAN ) 4 MG tablet Take 1 tablet (4 mg total) by mouth every 8 (eight) hours as needed for nausea or vomiting. 30 tablet 0   pantoprazole  (PROTONIX ) 40 MG tablet Take 1 tablet (40 mg total) by mouth daily. 90 tablet 3   promethazine  (PHENERGAN ) 12.5 MG tablet Take 1 tablet (12.5 mg total) by mouth every 6 (six) hours as needed for nausea or vomiting. 30 tablet 3   simvastatin  (ZOCOR ) 40 MG tablet Take 1 tablet (40 mg total) by mouth daily at 6 PM. 90 tablet 2   spironolactone  (ALDACTONE ) 25 MG tablet Take 0.5 tablets (12.5 mg total) by mouth daily. 90 tablet 3   tamsulosin (FLOMAX) 0.4 MG CAPS capsule Take by mouth.     No current facility-administered medications on file prior to visit.     Allergies  Allergen  Reactions   Clindamycin /Lincomycin Rash   Doxycycline Other (See Comments)    Makes heart race   Keflex  [Cephalexin ] Nausea And Vomiting   Sulfonic Acid (3,5-Dibromo-4-H-Ox-Benz) Other (See Comments)    Unknown    Adhesive [Tape] Rash   Latex Rash   Sulfonamide Derivatives Nausea And Vomiting   Social History   Socioeconomic History   Marital status: Married    Spouse name: Garen Dame   Number of children: 2   Years of education: 12   Highest education level: 12th grade  Occupational History   Occupation: Advertising Copywriter: RETIRED  Comment: Full time  Tobacco Use   Smoking status: Former    Current packs/day: 0.00    Types: Cigarettes    Start date: 01/03/1960    Quit date: 01/02/1994    Years since quitting: 30.5    Passive exposure: Past   Smokeless tobacco: Never  Vaping Use   Vaping status: Never Used  Substance and Sexual Activity   Alcohol use: No   Drug use: No   Sexual activity: Not Currently    Partners: Male  Other Topics Concern   Not on file  Social History Narrative   One child deceased from homicide at age 77    Social Drivers of Health   Tobacco Use: Medium Risk (06/03/2024)   Patient History    Smoking Tobacco Use: Former    Smokeless Tobacco Use: Never    Passive Exposure: Past  Physicist, Medical Strain: Low Risk (05/21/2022)   Overall Financial Resource Strain (CARDIA)    Difficulty of Paying Living Expenses: Not hard at all  Food Insecurity: No Food Insecurity (10/23/2023)   Hunger Vital Sign    Worried About Running Out of Food in the Last Year: Never true    Ran Out of Food in the Last Year: Never true  Transportation Needs: No Transportation Needs (10/23/2023)   PRAPARE - Administrator, Civil Service (Medical): No    Lack of Transportation (Non-Medical): No  Physical Activity: Inactive (05/21/2022)   Exercise Vital Sign    Days of Exercise per Week: 0 days    Minutes of Exercise per Session: 0 min  Stress:  Stress Concern Present (05/21/2022)   [employer] of Occupational Health - Occupational Stress Questionnaire    Feeling of Stress : To some extent  Social Connections: Moderately Isolated (10/23/2023)   Social Connection and Isolation Panel    Frequency of Communication with Friends and Family: More than three times a week    Frequency of Social Gatherings with Friends and Family: Once a week    Attends Religious Services: 1 to 4 times per year    Active Member of [employer] or Organizations: No    Attends Banker Meetings: Never    Marital Status: Widowed  Intimate Partner Violence: Not At Risk (10/23/2023)   Humiliation, Afraid, Rape, and Kick questionnaire    Fear of Current or Ex-Partner: No    Emotionally Abused: No    Physically Abused: No    Sexually Abused: No  Depression (PHQ2-9): Low Risk (06/03/2024)   Depression (PHQ2-9)    PHQ-2 Score: 0  Alcohol Screen: Low Risk (05/21/2022)   Alcohol Screen    Last Alcohol Screening Score (AUDIT): 2  Housing: Low Risk (10/23/2023)   Housing Stability Vital Sign    Unable to Pay for Housing in the Last Year: No    Number of Times Moved in the Last Year: 0    Homeless in the Last Year: No  Utilities: Not At Risk (10/23/2023)   [facility] Utilities    Threatened with loss of utilities: No  Health Literacy: Not on file     Review of Systems  All other systems reviewed and are negative.      Objective:   Physical Exam Vitals reviewed.  Constitutional:      General: She is not in acute distress.    Appearance: Normal appearance. She is not ill-appearing or toxic-appearing.  Cardiovascular:     Rate and Rhythm: Normal rate and regular rhythm.     Pulses:  Normal pulses.     Heart sounds: Normal heart sounds. No murmur heard.    No friction rub. No gallop.  Pulmonary:     Effort: Pulmonary effort is normal. No respiratory distress.     Breath sounds: No stridor. No wheezing, rhonchi or rales.  Abdominal:     General:  Abdomen is flat. Bowel sounds are normal. There is no distension.     Palpations: Abdomen is soft.     Tenderness: There is no abdominal tenderness. There is no guarding.  Musculoskeletal:     Cervical back: Neck supple.  Neurological:     General: No focal deficit present.     Mental Status: She is alert and oriented to person, place, and time.     Cranial Nerves: No cranial nerve deficit.     Sensory: No sensory deficit.     Motor: No weakness.     Coordination: Coordination normal.     Gait: Gait normal.     Deep Tendon Reflexes: Reflexes normal.  Psychiatric:        Mood and Affect: Mood normal.        Thought Content: Thought content normal.           Assessment & Plan:  Gastroparesis Patient's reaction seems to indicate that she has mild gastroparesis.  I recommended discontinuation of the Reglan  to see if symptoms return.  She may need to use Reglan  prior to meals periodically.  If the patient develops severe nausea and vomiting again, I would recommend EGD to rule out gastric outlet obstruction and obtain gastric emptying study.  Today she is completely asymptomatic "

## 2024-07-27 ENCOUNTER — Other Ambulatory Visit (HOSPITAL_COMMUNITY): Payer: Self-pay

## 2024-07-29 ENCOUNTER — Other Ambulatory Visit (HOSPITAL_COMMUNITY)

## 2024-08-03 ENCOUNTER — Other Ambulatory Visit (HOSPITAL_COMMUNITY): Payer: Self-pay

## 2024-08-04 ENCOUNTER — Ambulatory Visit: Admitting: Urology

## 2024-08-04 VITALS — BP 122/67 | HR 60

## 2024-08-04 DIAGNOSIS — N1330 Unspecified hydronephrosis: Secondary | ICD-10-CM

## 2024-08-04 DIAGNOSIS — N131 Hydronephrosis with ureteral stricture, not elsewhere classified: Secondary | ICD-10-CM

## 2024-08-04 NOTE — Patient Instructions (Signed)
 Hydronephrosis  Hydronephrosis is the swelling of one or both kidneys due to a blockage that stops urine from flowing out of the body. Kidneys filter waste from the blood and produce urine. This condition can lead to kidney failure and may become life-threatening if not treated promptly. What are the causes? In infants and children, common causes include problems that occur when a baby is developing in the womb. These can include problems in the kidneys or in the tubes that drain urine into the bladder (ureters). In adults, common causes include: Kidney stones. Pregnancy. A tumor or cyst in the abdomen or pelvis. An enlarged prostate gland. Other causes include: Bladder infection. Scar tissue from a previous surgery or injury. A blood clot. Cancer of the prostate, bladder, uterus, ovary, or colon. What are the signs or symptoms? Symptoms of this condition include: Pain or discomfort in your side (flank) or abdomen. Swelling in your abdomen. Nausea and vomiting. Fever. Pain when passing urine. Feelings of urgency when you need to urinate. Urinating more often than normal. In some cases, you may not have any symptoms. How is this diagnosed? This condition may be diagnosed based on: Your symptoms and medical history. A physical exam. Blood and urine tests. Imaging tests, such as an ultrasound, CT scan, or MRI. A procedure to look at your urinary tract and bladder by inserting a scope into the urethra (cystoscopy). How is this treated? Treatment for this condition depends on where the blockage is, how long it has been there, and what caused it. The goal of treatment is to remove the blockage. Treatment may include: Antibiotic medicines to treat or prevent infection. A procedure to place a small, thin tube (stent) into a blocked ureter. The stent will keep the ureter open so that urine can drain through it. A nonsurgical procedure that crushes kidney stones with shock waves  (extracorporeal shock wave lithotripsy). If kidney failure occurs, treatment may include dialysis or a kidney transplant. Follow these instructions at home:  Take over-the-counter and prescription medicines only as told by your health care provider. If you were prescribed an antibiotic medicine, take it exactly as told by your health care provider. Do not stop taking the antibiotic even if you start to feel better. Rest and return to your normal activities as told by your health care provider. Ask your health care provider what activities are safe for you. Drink enough fluid to keep your urine pale yellow. Keep all follow-up visits. This is important. Contact a health care provider if: You continue to have symptoms after treatment. You develop new symptoms. Your urine becomes cloudy or bloody. You have a fever. Get help right away if: You have severe flank or abdominal pain. You cannot drink fluids without vomiting. Summary Hydronephrosis is the swelling of one or both kidneys due to a blockage that stops urine from flowing out of the body. Hydronephrosis can lead to kidney failure and may become life-threatening if not treated promptly. The goal of treatment is to remove the blockage. It may include a procedure to insert a stent into a blocked ureter, a procedure to break up kidney stones, or taking antibiotic medicines. Follow your health care provider's instructions for taking care of yourself at home, including instructions about drinking fluids, taking medicines, and limiting activities. This information is not intended to replace advice given to you by your health care provider. Make sure you discuss any questions you have with your health care provider. Document Revised: 03/18/2023 Document Reviewed: 03/18/2023 Elsevier  Patient Education  2024 [employer].

## 2024-08-04 NOTE — Progress Notes (Unsigned)
 "  08/04/2024 2:52 PM   Erika Lee [birth year] [id number]  Referring provider: Duanne Butler DASEN, MD [address] [place],  [place] [address]  Right ureteral stricture   HPI: Erika Lee is a 78yo here for followup for right ureteral stricture. She underwent nephrostomy tube change 06/03/2024. She is having intermittent right flank pain and discomfort around the nephrostomy tube site.    PMH: Past Medical History:  Diagnosis Date   Anticoagulant long-term use    eliquis    Chronic kidney disease    Chronic sinusitis    Dysrhythmia    A.fib,   GERD (gastroesophageal reflux disease)    Heart failure with preserved ejection fraction (HCC)    Hemorrhoids    History of colitis 10/2016   campylobacter   Hyperlipidemia    Hypertension    Multinodular goiter    per pt has had 2 biopsy's both benign   NSTEMI (non-ST elevated myocardial infarction) (HCC)    12/2018- normal coronary arteries on cath, poss coronary vasospasm.    Osteoarthritis    all over  and [facility] joint left shoulder   Paroxysmal atrial fibrillation [facility]) EP cardiologist--  dr kelsie   s/p PVI by Dr Kelsie 07/16/2013;   pt first dx 2008,  recurrence 2014 afib/flutter and tachy-brady   Rotator cuff tear, left    Shoulder impingement, left    Status post placement of implantable loop recorder    original placement 07-26-2014;  removal / replacement 10-17-2017  by dr allred   Type 2 diabetes mellitus (HCC)    followed by pcp    Surgical History: Past Surgical History:  Procedure Laterality Date   ATRIAL FIBRILLATION ABLATION N/A 07/16/2013   PVI and CTI ablation by Dr Kelsie   CARDIAC CATHETERIZATION     01/11/2019- Normal coronary arteries.    COLONOSCOPY  09/10/2011   Dr. Mavis: normal, 10 year follow up.   CYSTOSCOPY W/ URETERAL STENT PLACEMENT Right 10/22/2023   Procedure: CYSTOSCOPY, WITH RETROGRADE PYELOGRAM, RIGHT URETEROSCOPY, PERFORMANCE OF RIGHT RENAL WASHINGS AND URETERAL STENT INSERTION;   Surgeon: Matilda Senior, MD;  Location: [facility];  Service: Urology;  Laterality: Right;   CYSTOSCOPY WITH RETROGRADE PYELOGRAM, URETEROSCOPY AND STENT PLACEMENT Right 02/26/2024   Procedure: DIAGNOSTIC URETEROSCOPY, RETROGRADE PYELOGRAM.;  Surgeon: Sherrilee Belvie CROME, MD;  Location: [facility];  Service: Urology;  Laterality: Right;   implantable loop recorder removal  04/25/2021   MDT LINQ removed by Dr Kelsie   IR NEPHROSTOMY EXCHANGE RIGHT  06/03/2024   IR NEPHROSTOMY PLACEMENT RIGHT  03/31/2024   LEFT HEART CATH AND CORONARY ANGIOGRAPHY N/A 01/11/2019   Procedure: LEFT HEART CATH AND CORONARY ANGIOGRAPHY;  Surgeon: Court Dorn PARAS, MD;  Location: [facility];  Service: Cardiovascular;  Laterality: N/A;   LESION REMOVAL N/A 05/03/2013   Procedure: EXCISION CYST, BACK;  Surgeon: Oneil DELENA Mavis, MD;  Location: [facility];  Service: General;  Laterality: N/A;   LOOP RECORDER IMPLANT N/A 07/26/2014   Procedure: LOOP RECORDER IMPLANT;  Surgeon: Lynwood Kelsie, MD;  Location: [facility] CATH LAB;  Service: Cardiovascular;  Laterality: N/A;   LOOP RECORDER INSERTION N/A 10/17/2017   MDT previously implanted ILR for afib management at [facility].  old device removed and new device placed by Dr Kelsie   LOOP RECORDER REMOVAL N/A 10/17/2017   MDT ILR removed with new device subsequently replaced   SHOULDER ARTHROSCOPY WITH ROTATOR CUFF REPAIR AND SUBACROMIAL DECOMPRESSION Left 01/07/2019   Procedure: Left shoulder subacromial decompression, distal  clavicle resection, extensive debridement,;  Surgeon: Sharl Selinda Dover, MD;  Location: [facility];  Service: Orthopedics;  Laterality: Left;   TEE WITHOUT CARDIOVERSION N/A 07/15/2013   Procedure: TRANSESOPHAGEAL ECHOCARDIOGRAM (TEE);  Surgeon: Redell GORMAN Shallow, MD;  Location: [facility] ENDOSCOPY;  Service: Cardiovascular;  Laterality: N/A;   TONSILLECTOMY  age 19   TRANSANAL HEMORRHOIDAL DEARTERIALIZATION N/A 09/22/2015   Procedure: TRANSANAL HEMORRHOIDAL  LIGATION/PEXY EUA POSSIBLE HEMORRHOIDECTOMY ;  Surgeon: Elspeth Schultze, MD;  Location: [facility];  Service: General;  Laterality: N/A;   URETERAL BIOPSY Right 02/26/2024   Procedure: BIOPSY, URETER;  Surgeon: Sherrilee Dover CROME, MD;  Location: [facility];  Service: Urology;  Laterality: Right;    Home Medications:  Allergies as of 08/04/2024       Reactions   Clindamycin /lincomycin Rash   Doxycycline Other (See Comments)   Makes heart race   Keflex  [cephalexin ] Nausea And Vomiting   Sulfonic Acid (3,5-dibromo-4-h-ox-benz) Other (See Comments)   Unknown    Adhesive [tape] Rash   Latex Rash   Sulfonamide Derivatives Nausea And Vomiting        Medication List        Accurate as of August 04, 2024  2:52 PM. If you have any questions, ask your nurse or doctor.          amLODipine  5 MG tablet Commonly known as: NORVASC  Take 1 tablet (5 mg total) by mouth daily.   apixaban  5 MG Tabs tablet Commonly known as: Eliquis  Take 1 tablet (5 mg total) by mouth 2 (two) times daily.   azelastine  0.1 % nasal spray Commonly known as: ASTELIN  Place 1 spray into both nostrils 2 (two) times daily. Use in each nostril as directed- helps with postnasal drip   diazepam  5 MG tablet Commonly known as: VALIUM  TAKE 1 TABLET(5 MG) BY MOUTH AT BEDTIME AS NEEDED FOR SLEEP   estradiol  0.1 MG/GM vaginal cream Commonly known as: ESTRACE  Place 1 Applicatorful vaginally at bedtime.   freestyle lancets Use to check blood sugar daily.   FreeStyle Precision Neo Test test strip Generic drug: glucose blood DX: E11.9 Use to check blood sugar daily   HYDROcodone -acetaminophen  10-325 MG tablet Commonly known as: NORCO Take 1 tablet by mouth every 6 (six) hours as needed.   lisinopril  40 MG tablet Commonly known as: ZESTRIL  Take 1 tablet (40 mg total) by mouth daily.   metoCLOPramide  10 MG tablet Commonly known as: Reglan  Take 1 tablet (10 mg total) by mouth 3 (three) times daily before meals.    mometasone  0.1 % cream Commonly known as: ELOCON  Apply topically daily.   ondansetron  4 MG tablet Commonly known as: Zofran  Take 1 tablet (4 mg total) by mouth every 8 (eight) hours as needed for nausea or vomiting.   pantoprazole  40 MG tablet Commonly known as: PROTONIX  Take 1 tablet (40 mg total) by mouth daily.   promethazine  12.5 MG tablet Commonly known as: PHENERGAN  Take 1 tablet (12.5 mg total) by mouth every 6 (six) hours as needed for nausea or vomiting.   simvastatin  40 MG tablet Commonly known as: ZOCOR  Take 1 tablet (40 mg total) by mouth daily at 6 PM.   spironolactone  25 MG tablet Commonly known as: ALDACTONE  Take 0.5 tablets (12.5 mg total) by mouth daily.   tamsulosin 0.4 MG Caps capsule Commonly known as: FLOMAX Take by mouth.        Allergies: Allergies[1]  Family History: Family History  Problem Relation Age of Onset   Stroke Mother  76   Stroke Maternal Grandmother        stroke   Other Maternal Grandfather        heart dropsy kidney issues   Kidney disease Maternal Grandfather    Colon cancer Neg Hx     Social History:  reports that she quit smoking about 30 years ago. Her smoking use included cigarettes. She started smoking about 64 years ago. She has been exposed to tobacco smoke. She has never used smokeless tobacco. She reports that she does not drink alcohol and does not use drugs.  ROS: All other review of systems were reviewed and are negative except what is noted above in HPI  Physical Exam: BP 122/67   Pulse 60   LMP  (LMP Unknown)   Constitutional:  Alert and oriented, No acute distress. HEENT: [place] AT, moist mucus membranes.  Trachea midline, no masses. Cardiovascular: No clubbing, cyanosis, or edema. Respiratory: Normal respiratory effort, no increased work of breathing. GI: Abdomen is soft, nontender, nondistended, no abdominal masses GU: No CVA tenderness.  Lymph: No cervical or inguinal lymphadenopathy. Skin: No  rashes, bruises or suspicious lesions. Neurologic: Grossly intact, no focal deficits, moving all 4 extremities. Psychiatric: Normal mood and affect.  Laboratory Data: Lab Results  Component Value Date   WBC 6.1 07/08/2024   HGB 12.5 07/08/2024   HCT 38.8 07/08/2024   MCV 89.6 07/08/2024   PLT 188 07/08/2024    Lab Results  Component Value Date   CREATININE 1.36 (H) 07/08/2024    No results found for: PSA  No results found for: TESTOSTERONE  Lab Results  Component Value Date   HGBA1C 6.1 (H) 06/03/2024    Urinalysis    Component Value Date/Time   COLORURINE YELLOW 03/31/2024 1053   APPEARANCEUR CLOUDY (A) 03/31/2024 1053   APPEARANCEUR Clear 03/17/2024 1420   LABSPEC >1.046 (H) 03/31/2024 1053   PHURINE 7.0 03/31/2024 1053   GLUCOSEU NEGATIVE 03/31/2024 1053   HGBUR LARGE (A) 03/31/2024 1053   BILIRUBINUR NEGATIVE 03/31/2024 1053   BILIRUBINUR Negative 03/17/2024 1420   KETONESUR NEGATIVE 03/31/2024 1053   PROTEINUR NEGATIVE 03/31/2024 1053   UROBILINOGEN 0.2 08/16/2014 1958   NITRITE NEGATIVE 03/31/2024 1053   LEUKOCYTESUR NEGATIVE 03/31/2024 1053    Lab Results  Component Value Date   LABMICR See below: 03/17/2024   WBCUA 6-10 (A) 03/17/2024   LABEPIT 0-10 03/17/2024   BACTERIA RARE (A) 03/31/2024    Pertinent Imaging: *** No results found for this or any previous visit.  No results found for this or any previous visit.  No results found for this or any previous visit.  No results found for this or any previous visit.  Results for orders placed during the hospital encounter of 11/28/23  US  RENAL  Narrative CLINICAL DATA:  Hydronephrosis of right kidney  EXAM: RENAL / URINARY TRACT ULTRASOUND COMPLETE  COMPARISON:  10/22/2023 CT  FINDINGS: Right Kidney:  Renal measurements: 10.1 x 4.8 x 5.2 cm = volume: 132 mL. Normal echotexture. Moderate right hydronephrosis. No mass.  Left Kidney:  Renal measurements: 9.8 x 5.8 x 4.9 cm =  volume: 145 mL. Echogenicity within normal limits. No mass or hydronephrosis visualized.  Bladder:  Appears normal for degree of bladder distention.  Other:  None.  IMPRESSION: Moderate right hydronephrosis.   Electronically Signed By: Franky Crease M.D. On: 12/06/2023 22:24  No results found for this or any previous visit.  Results for orders placed during the hospital encounter of 09/30/23  CT  HEMATURIA WORKUP  Narrative CLINICAL DATA:  Unintentional weight loss and gross hematuria  EXAM: CT CHEST WITH CONTRAST  CT ABDOMEN AND PELVIS WITH AND WITHOUT CONTRAST  TECHNIQUE: Multidetector CT imaging of the chest was performed during intravenous contrast administration. Multidetector CT imaging of the abdomen and pelvis was performed following the standard protocol before and during bolus administration of intravenous contrast.  RADIATION DOSE REDUCTION: This exam was performed according to the departmental dose-optimization program which includes automated exposure control, adjustment of the mA and/or kV according to patient size and/or use of iterative reconstruction technique.  CONTRAST:  OMNIPAQUE  IOHEXOL  350 MG/ML SOLN  COMPARISON:  CT abdomen and pelvis dated 08/14/2023, CTA chest dated 01/09/2019  FINDINGS: CT CHEST FINDINGS  Cardiovascular: Multichamber cardiomegaly. No significant pericardial fluid/thickening. Great vessels are normal in course and caliber. No central pulmonary emboli. Coronary artery calcifications.  Mediastinum/Nodes: Enlarged, heterogeneous thyroid  gland, previously evaluated. Normal esophagus. No pathologically enlarged axillary, supraclavicular, mediastinal, or hilar lymph nodes.  Lungs/Pleura: The central airways are patent. No suspicious pulmonary nodules. No focal consolidation. No pneumothorax. No pleural effusion.  Musculoskeletal: No acute or abnormal lytic or blastic osseous lesions. Multilevel degenerative  changes of the thoracic spine.  CT ABDOMEN PELVIS FINDINGS  Hepatobiliary: Subcentimeter segment 5 hypodensity (9:21), too small to characterize. No intra or extrahepatic biliary ductal dilation. Cholelithiasis.  Pancreas: No focal lesions or main ductal dilation.  Spleen: Normal in size without focal abnormality.  Adrenals/Urinary Tract: No adrenal nodules. Similar mild-to-moderate right hydroureteronephrosis to the level of the distal right ureter, where there is abrupt cut off and enhancing soft tissue density (12:46, 13:65). Delayed right nephrogram. No suspicious renal masses or calculi. No suspicious filling defect visualized within the opacified portions of the left renal collecting system or ureter on delayed imaging. No focal bladder wall thickening.  Stomach/Bowel: Normal appearance of the stomach. No evidence of bowel wall thickening or inflammatory changes. Dilated rectum contains large volume stool. Moderate to large volume stool within the remainder of the colon. Appendix is not discretely seen.  Vascular/Lymphatic: Aortic atherosclerosis. No enlarged abdominal or pelvic lymph nodes.  Reproductive: Calcified uterine leiomyoma.  No adnexal masses.  Other: No free fluid, fluid collection, or free air.  Musculoskeletal: No acute or abnormal lytic or blastic osseous findings. Multilevel degenerative changes of the lumbar spine.  IMPRESSION: CT CHEST:  1. No acute intrathoracic abnormality. 2. Multichamber cardiomegaly. 3. Coronary artery calcifications. Assessment for potential risk factor modification, dietary therapy or pharmacologic therapy may be warranted, if clinically indicated.  CT ABDOMEN AND PELVIS:  1. Similar mild-to-moderate right hydroureteronephrosis to the level of the distal right ureter, where there is abrupt cut off and enhancing soft tissue density, suspicious for urothelial neoplasm. Recommend further evaluation with cystoscopy. 2.  Dilated rectum contains large volume stool. Moderate to large volume stool within the remainder of the colon. Recommend correlation with constipation. 3. Cholelithiasis.  Aortic Atherosclerosis (ICD10-I70.0).   Electronically Signed By: Limin  Xu M.D. On: 10/01/2023 16:21  Results for orders placed during the hospital encounter of 08/14/23  CT RENAL STONE STUDY  Narrative CLINICAL DATA:  Hematuria  EXAM: CT ABDOMEN AND PELVIS WITHOUT CONTRAST  TECHNIQUE: Multidetector CT imaging of the abdomen and pelvis was performed following the standard protocol without IV contrast.  RADIATION DOSE REDUCTION: This exam was performed according to the departmental dose-optimization program which includes automated exposure control, adjustment of the mA and/or kV according to patient size and/or use of iterative reconstruction technique.  COMPARISON:  None Available.  FINDINGS: Lower chest: Bases clear.  No pericardial or pleural effusions.  Hepatobiliary: No hepatic parenchymal abnormalities. Gallbladder appears distended with small calcified stones or milk of calcium . No pericholecystic inflammatory changes or fluid.  Pancreas: Unremarkable. No pancreatic ductal dilatation or surrounding inflammatory changes.  Spleen: Normal in size without focal abnormality.  Adrenals/Urinary Tract: No adrenal lesions. No intrarenal stones. Right-sided hydronephrosis and hydroureter. No ureteral stones. Unremarkable urinary bladder.  Stomach/Bowel: Stomach is within normal limits. Appendix appears normal. No evidence of bowel wall thickening, distention, or inflammatory changes.  Vascular/Lymphatic: Aortic atherosclerosis. No enlarged abdominal or pelvic lymph nodes.  Reproductive: Calcified fibroid.  Other: No abdominal wall hernia or abnormality. No abdominopelvic ascites.  Musculoskeletal: Thoracolumbosacral degenerative changes.  IMPRESSION: 1. Right-sided hydronephrosis and  hydroureter. No obstructing stones identified. 2. Distended gallbladder with calcified stones or milk of calcium . 3. Calcified fibroid. 4. Aortic atherosclerosis.   Electronically Signed By: Fonda Field M.D. On: 08/30/2023 19:10   Assessment & Plan:    1. Hydronephrosis of right kidney (Primary) Lasix  renogram, will call with results.    No follow-ups on file.  [name], MD  [facility] [place]      [1]  Allergies Allergen Reactions   Clindamycin /Lincomycin Rash   Doxycycline Other (See Comments)    Makes heart race   Keflex  [Cephalexin ] Nausea And Vomiting   Sulfonic Acid (3,5-Dibromo-4-H-Ox-Benz) Other (See Comments)    Unknown    Adhesive [Tape] Rash   Latex Rash   Sulfonamide Derivatives Nausea And Vomiting   "

## 2024-08-05 ENCOUNTER — Ambulatory Visit: Admitting: Family Medicine

## 2024-08-05 ENCOUNTER — Ambulatory Visit: Payer: Self-pay

## 2024-08-05 ENCOUNTER — Encounter: Payer: Self-pay | Admitting: Family Medicine

## 2024-08-05 ENCOUNTER — Other Ambulatory Visit: Payer: Self-pay

## 2024-08-05 VITALS — BP 118/70 | HR 66 | Temp 98.2°F | Ht 64.0 in | Wt 148.1 lb

## 2024-08-05 DIAGNOSIS — K3184 Gastroparesis: Secondary | ICD-10-CM

## 2024-08-05 DIAGNOSIS — R112 Nausea with vomiting, unspecified: Secondary | ICD-10-CM

## 2024-08-05 NOTE — Telephone Encounter (Signed)
 FYI Only or Action Required?: Action required by provider: request for appointment and clinical question for provider.  Patient was last seen in primary care on 07/23/2024 by Duanne Butler DASEN, MD.  Called Nurse Triage reporting Nausea, Vomiting, and Gas.  Symptoms began Unknown.  Interventions attempted: Other: Unknown.  Symptoms are: gradually worsening.  Triage Disposition: Call PCP Now  Patient/caregiver understands and will follow disposition?: Yes   Reason for Disposition  [1] Caller requests to speak to PCP now AND [2] won't give reason for call  (Exception: If 10 pm to 6 am, caller must first discuss reason for the call.)  Answer Assessment - Initial Assessment Questions 1. REASON FOR CALL or QUESTION: What is your reason for calling today? or How can I best     Patient states that she had a missed call from her PCP office and is returning the call. She was transferred to nurse triage for reported symptoms of gas, nausea, and occasional vomiting. Patient refuses to speak with this RN and does not want to answer any triage questions. She requests to speak with nurse in PCP's office. Called office to inform them of this, but patient disconnected.   2. CALLER: Document the source of call. (e.g., laboratory staff, caregiver or patient).     Patient  Protocols used: PCP Call - No Triage-A-AH  Worsening of gas, nausea, and sometimes vomiting within the past few days would like to schedule an appointment

## 2024-08-05 NOTE — Progress Notes (Signed)
 "    Subjective:    Patient ID: Erika Lee, female    DOB: 06/03/1946, 79 y.o.   MRN: [id number] 07/08/24 Had CT 7/25: FINDINGS: Lower chest: Cardiomegaly scattered pulmonary ground-glass micro nodules, stable to prior, for example left lung base (6/20)   Hepatobiliary: Normal size liver. Dilation of the intrahepatic bile ducts. Gallbladder is severely thick-walled. Cholelithiasis. Segment 4 subcentimeter liver lesion too small to characterize likely a cyst. Dilation of the common bile duct. Suggestion of choledocholithiasis.   Pancreas: Unremarkable. No pancreatic ductal dilatation or surrounding inflammatory changes.   Spleen: Normal in size without focal abnormality.   Adrenals/Urinary Tract: Subcentimeter bilateral renal cortical cysts which does not require imaging follow-up. Mild hydroureteronephrosis on the right without definite obstructive ureteral cause or ureteral calculus. Interval removal of the right ureteral stent. Distal right ureter is obscured by overlying bowel loops and the right ureter is distended to the level of the pelvic brim. Punctate calcification along the left kidney posteromedial cortex in the interpolar region.   Stomach/Bowel: Gastric distension is improved. Large stool is present throughout the colon suggestive of constipation. No bowel obstruction or definite mass lesion. The stomach is unremarkable.   Patient states that there has been concern about her gallbladder for years.  Her previous PCP ordered a HIDA scan in 2006 which was negative.  Had MRCP in 2018: Hepatobiliary: No significant focal liver lesion. Borderline dilated gallbladder about 4.3 cm diameter. Indistinct hepatic steatosis.   The common hepatic duct measures up to 8 mm in diameter and the common bile duct measures up to 6 mm diameter. The no filling defect in the biliary tree is identified. No abnormal enhancement or obvious stricturing.  She has seen GI.  She has never  had pain after eating.  Therefore, she never underwent a cholecystectomy.  About 2 months ago, the patient started developing nausea 2 hours after eating.  She states that if she eats a small meal such as cereal she does fine.  However if she eats a large meal, 2 hours later she will be extremely nauseated and often have to vomit.  She denies any melena.  She denies any hematochezia.  She denies any hematemesis.  She denies any biliary emesis.  She is having normal bowel movements.  She denies any constipation or bowel obstruction.  Today she has normal bowel sounds with no gastric distention.  She has no tenderness to palpation in the right upper quadrant or in the left upper quadrant.  She has been taking her heartburn medication.  She denies any severe heartburn.  At that time, my plan was: Physical exam today is unremarkable.  Differential diagnosis includes biliary dyskinesia/chronic cholecystitis versus ulcer versus H. pylori versus gastroparesis.  I favor chronic cholecystitis based on her CT scan findings.  Her abdominal exam today is completely unremarkable.  There is no guarding or rebound or pain.  I will start by checking a CBC a CMP and a lipase.  If her liver test are elevated or if her lipase is elevated I will recommend a HIDA scan and surgery consultation versus MRCP.  I will also check an H. pylori breath test to evaluate for the presence of H. pylori.  If all lab work is unremarkable, consider gastric emptying study to evaluate for gastroparesis.  She had a negative gastric emptying study in 2019.  07/23/24 Patient's labs were completely normal.  H. pylori test was negative.  For that reason I recommended trying Reglan  10 mg prior  to meals empirically for possible gastroparesis and I suggested getting a HIDA scan to evaluate for biliary dyskinesia.  Patient states that once she started the Reglan  prior to supper, her nausea improved dramatically.  She denies any more nausea or vomiting.  She is  tolerating food without difficulty.  She states that she has felt fine in the last 2 weeks.  At that time, my plan was: Patient's reaction seems to indicate that she has mild gastroparesis.  I recommended discontinuation of the Reglan  to see if symptoms return.  She may need to use Reglan  prior to meals periodically.  If the patient develops severe nausea and vomiting again, I would recommend EGD to rule out gastric outlet obstruction and obtain gastric emptying study.  Today she is completely asymptomatic  08/05/24 Patient states that since I last saw her, she has vomited 2 times.  She is able to eat cereal in the morning without difficulty.  She is able to drink coffee without difficulty.  However if she eats a larger meal, she develops early satiety, nausea, and will vomit.  The Reglan  helps some but is not controlling symptoms completely.  She has been taking this 30 minutes before meals consistently. Past Medical History:  Diagnosis Date   Anticoagulant long-term use    eliquis    Chronic kidney disease    Chronic sinusitis    Dysrhythmia    A.fib,   GERD (gastroesophageal reflux disease)    Heart failure with preserved ejection fraction (HCC)    Hemorrhoids    History of colitis 10/2016   campylobacter   Hyperlipidemia    Hypertension    Multinodular goiter    per pt has had 2 biopsy's both benign   NSTEMI (non-ST elevated myocardial infarction) (HCC)    12/2018- normal coronary arteries on cath, poss coronary vasospasm.    Osteoarthritis    all over  and [facility] joint left shoulder   Paroxysmal atrial fibrillation [facility]) EP cardiologist--  dr kelsie   s/p PVI by Dr Kelsie 07/16/2013;   pt first dx 2008,  recurrence 2014 afib/flutter and tachy-brady   Rotator cuff tear, left    Shoulder impingement, left    Status post placement of implantable loop recorder    original placement 07-26-2014;  removal / replacement 10-17-2017  by dr allred   Type 2 diabetes mellitus (HCC)    followed by  pcp   Past Surgical History:  Procedure Laterality Date   ATRIAL FIBRILLATION ABLATION N/A 07/16/2013   PVI and CTI ablation by Dr Kelsie   CARDIAC CATHETERIZATION     01/11/2019- Normal coronary arteries.    COLONOSCOPY  09/10/2011   Dr. Mavis: normal, 10 year follow up.   CYSTOSCOPY W/ URETERAL STENT PLACEMENT Right 10/22/2023   Procedure: CYSTOSCOPY, WITH RETROGRADE PYELOGRAM, RIGHT URETEROSCOPY, PERFORMANCE OF RIGHT RENAL WASHINGS AND URETERAL STENT INSERTION;  Surgeon: Matilda Senior, MD;  Location: [facility];  Service: Urology;  Laterality: Right;   CYSTOSCOPY WITH RETROGRADE PYELOGRAM, URETEROSCOPY AND STENT PLACEMENT Right 02/26/2024   Procedure: DIAGNOSTIC URETEROSCOPY, RETROGRADE PYELOGRAM.;  Surgeon: Sherrilee Belvie CROME, MD;  Location: [facility];  Service: Urology;  Laterality: Right;   implantable loop recorder removal  04/25/2021   MDT LINQ removed by Dr Kelsie   IR NEPHROSTOMY EXCHANGE RIGHT  06/03/2024   IR NEPHROSTOMY PLACEMENT RIGHT  03/31/2024   LEFT HEART CATH AND CORONARY ANGIOGRAPHY N/A 01/11/2019   Procedure: LEFT HEART CATH AND CORONARY ANGIOGRAPHY;  Surgeon: Court Dorn PARAS, MD;  Location: [facility]  INVASIVE CV LAB;  Service: Cardiovascular;  Laterality: N/A;   LESION REMOVAL N/A 05/03/2013   Procedure: EXCISION CYST, BACK;  Surgeon: Oneil DELENA Budge, MD;  Location: [facility];  Service: General;  Laterality: N/A;   LOOP RECORDER IMPLANT N/A 07/26/2014   Procedure: LOOP RECORDER IMPLANT;  Surgeon: Lynwood Rakers, MD;  Location: [facility] CATH LAB;  Service: Cardiovascular;  Laterality: N/A;   LOOP RECORDER INSERTION N/A 10/17/2017   MDT previously implanted ILR for afib management at [facility].  old device removed and new device placed by Dr Rakers   LOOP RECORDER REMOVAL N/A 10/17/2017   MDT ILR removed with new device subsequently replaced   SHOULDER ARTHROSCOPY WITH ROTATOR CUFF REPAIR AND SUBACROMIAL DECOMPRESSION Left 01/07/2019   Procedure: Left shoulder subacromial decompression, distal  clavicle resection, extensive debridement,;  Surgeon: Sharl Selinda Dover, MD;  Location: [facility];  Service: Orthopedics;  Laterality: Left;   TEE WITHOUT CARDIOVERSION N/A 07/15/2013   Procedure: TRANSESOPHAGEAL ECHOCARDIOGRAM (TEE);  Surgeon: Redell GORMAN Shallow, MD;  Location: [facility] ENDOSCOPY;  Service: Cardiovascular;  Laterality: N/A;   TONSILLECTOMY  age 43   TRANSANAL HEMORRHOIDAL DEARTERIALIZATION N/A 09/22/2015   Procedure: TRANSANAL HEMORRHOIDAL LIGATION/PEXY EUA POSSIBLE HEMORRHOIDECTOMY ;  Surgeon: Elspeth Schultze, MD;  Location: [facility];  Service: General;  Laterality: N/A;   URETERAL BIOPSY Right 02/26/2024   Procedure: BIOPSY, URETER;  Surgeon: Sherrilee Dover CROME, MD;  Location: [facility];  Service: Urology;  Laterality: Right;   Current Outpatient Medications on File Prior to Visit  Medication Sig Dispense Refill   amLODipine  (NORVASC ) 5 MG tablet Take 1 tablet (5 mg total) by mouth daily. 90 tablet 3   apixaban  (ELIQUIS ) 5 MG TABS tablet Take 1 tablet (5 mg total) by mouth 2 (two) times daily. 180 tablet 3   azelastine  (ASTELIN ) 0.1 % nasal spray Place 1 spray into both nostrils 2 (two) times daily. Use in each nostril as directed- helps with postnasal drip 30 mL 1   diazepam  (VALIUM ) 5 MG tablet TAKE 1 TABLET(5 MG) BY MOUTH AT BEDTIME AS NEEDED FOR SLEEP 30 tablet 3   estradiol  (ESTRACE ) 0.1 MG/GM vaginal cream Place 1 Applicatorful vaginally at bedtime. 42.5 g 12   glucose blood (FREESTYLE PRECISION NEO TEST) test strip DX: E11.9 Use to check blood sugar daily 100 strip 5   HYDROcodone -acetaminophen  (NORCO) 10-325 MG tablet Take 1 tablet by mouth every 6 (six) hours as needed. 120 tablet 0   Lancets (FREESTYLE) lancets Use to check blood sugar daily. 100 each 5   lisinopril  (ZESTRIL ) 40 MG tablet Take 1 tablet (40 mg total) by mouth daily. 90 tablet 3   metoCLOPramide  (REGLAN ) 10 MG tablet Take 1 tablet (10 mg total) by mouth 3 (three) times daily before meals. 90  tablet 2   mometasone  (ELOCON ) 0.1 % cream Apply topically daily. 15 g 1   ondansetron  (ZOFRAN ) 4 MG tablet Take 1 tablet (4 mg total) by mouth every 8 (eight) hours as needed for nausea or vomiting. 30 tablet 0   pantoprazole  (PROTONIX ) 40 MG tablet Take 1 tablet (40 mg total) by mouth daily. 90 tablet 3   promethazine  (PHENERGAN ) 12.5 MG tablet Take 1 tablet (12.5 mg total) by mouth every 6 (six) hours as needed for nausea or vomiting. 30 tablet 3   simvastatin  (ZOCOR ) 40 MG tablet Take 1 tablet (40 mg total) by mouth daily at 6 PM. 90 tablet 2   spironolactone  (ALDACTONE ) 25 MG tablet Take 0.5 tablets (12.5 mg total)  by mouth daily. 90 tablet 3   tamsulosin (FLOMAX) 0.4 MG CAPS capsule Take by mouth. (Patient not taking: Reported on 08/05/2024)     No current facility-administered medications on file prior to visit.     Allergies  Allergen Reactions   Clindamycin /Lincomycin Rash   Doxycycline Other (See Comments)    Makes heart race   Keflex  [Cephalexin ] Nausea And Vomiting   Sulfonic Acid (3,5-Dibromo-4-H-Ox-Benz) Other (See Comments)    Unknown    Adhesive [Tape] Rash   Latex Rash   Sulfonamide Derivatives Nausea And Vomiting   Social History   Socioeconomic History   Marital status: Married    Spouse name: Interior And Spatial Designer   Number of children: 2   Years of education: 12   Highest education level: 12th grade  Occupational History   Occupation: Advertising Copywriter: RETIRED    Comment: Full time  Tobacco Use   Smoking status: Former    Current packs/day: 0.00    Types: Cigarettes    Start date: 01/03/1960    Quit date: 01/02/1994    Years since quitting: 30.6    Passive exposure: Past   Smokeless tobacco: Never  Vaping Use   Vaping status: Never Used  Substance and Sexual Activity   Alcohol use: No   Drug use: No   Sexual activity: Not Currently    Partners: Male  Other Topics Concern   Not on file  Social History Narrative   One child deceased from  homicide at age 5    Social Drivers of Health   Tobacco Use: Medium Risk (08/05/2024)   Patient History    Smoking Tobacco Use: Former    Smokeless Tobacco Use: Never    Passive Exposure: Past  Physicist, Medical Strain: Low Risk (05/21/2022)   Overall Financial Resource Strain (CARDIA)    Difficulty of Paying Living Expenses: Not hard at all  Food Insecurity: No Food Insecurity (10/23/2023)   Hunger Vital Sign    Worried About Running Out of Food in the Last Year: Never true    Ran Out of Food in the Last Year: Never true  Transportation Needs: No Transportation Needs (10/23/2023)   PRAPARE - Administrator, Civil Service (Medical): No    Lack of Transportation (Non-Medical): No  Physical Activity: Inactive (05/21/2022)   Exercise Vital Sign    Days of Exercise per Week: 0 days    Minutes of Exercise per Session: 0 min  Stress: Stress Concern Present (05/21/2022)   [employer] of Occupational Health - Occupational Stress Questionnaire    Feeling of Stress : To some extent  Social Connections: Moderately Isolated (10/23/2023)   Social Connection and Isolation Panel    Frequency of Communication with Friends and Family: More than three times a week    Frequency of Social Gatherings with Friends and Family: Once a week    Attends Religious Services: 1 to 4 times per year    Active Member of [employer] or Organizations: No    Attends Banker Meetings: Never    Marital Status: Widowed  Intimate Partner Violence: Not At Risk (10/23/2023)   Humiliation, Afraid, Rape, and Kick questionnaire    Fear of Current or Ex-Partner: No    Emotionally Abused: No    Physically Abused: No    Sexually Abused: No  Depression (PHQ2-9): Low Risk (07/23/2024)   Depression (PHQ2-9)    PHQ-2 Score: 0  Alcohol Screen: Low Risk (05/21/2022)   Alcohol Screen  Last Alcohol Screening Score (AUDIT): 2  Housing: Low Risk (10/23/2023)   Housing Stability Vital Sign    Unable to Pay  for Housing in the Last Year: No    Number of Times Moved in the Last Year: 0    Homeless in the Last Year: No  Utilities: Not At Risk (10/23/2023)   [facility] Utilities    Threatened with loss of utilities: No  Health Literacy: Not on file     Review of Systems  All other systems reviewed and are negative.      Objective:   Physical Exam Vitals reviewed.  Constitutional:      General: She is not in acute distress.    Appearance: Normal appearance. She is not ill-appearing or toxic-appearing.  Cardiovascular:     Rate and Rhythm: Normal rate and regular rhythm.     Pulses: Normal pulses.     Heart sounds: Normal heart sounds. No murmur heard.    No friction rub. No gallop.  Pulmonary:     Effort: Pulmonary effort is normal. No respiratory distress.     Breath sounds: No stridor. No wheezing, rhonchi or rales.  Abdominal:     General: Abdomen is flat. Bowel sounds are normal. There is no distension.     Palpations: Abdomen is soft.     Tenderness: There is no abdominal tenderness. There is no guarding.  Musculoskeletal:     Cervical back: Neck supple.  Neurological:     General: No focal deficit present.     Mental Status: She is alert and oriented to person, place, and time.     Cranial Nerves: No cranial nerve deficit.     Sensory: No sensory deficit.     Motor: No weakness.     Coordination: Coordination normal.     Gait: Gait normal.     Deep Tendon Reflexes: Reflexes normal.  Psychiatric:        Mood and Affect: Mood normal.        Thought Content: Thought content normal.           Assessment & Plan:  Nausea and vomiting, unspecified vomiting type  Gastroparesis Symptoms in response seem to suggest gastroparesis.  However the patient has had nausea and vomiting intermittently for years and gastric emptying studies have been negative in the past.  HIDA scans have been unremarkable in the past.  Patient has had an MRCP which showed no significant biliary tract  disease.  Therefore, I will send the patient back to her gastroenterologist to determine if they feel an EGD is warranted to rule out gastric outlet obstruction prior to ordering a gastric emptying study.  Patient is already taking promethazine  on a nightly basis for her chronic nausea.  Now she is taking Reglan  30 minutes before lunch and dinner. "

## 2024-08-06 ENCOUNTER — Other Ambulatory Visit: Payer: Self-pay | Admitting: Urology

## 2024-08-06 ENCOUNTER — Inpatient Hospital Stay (HOSPITAL_COMMUNITY): Admission: RE | Admit: 2024-08-06 | Payer: Self-pay

## 2024-08-06 DIAGNOSIS — N1330 Unspecified hydronephrosis: Secondary | ICD-10-CM

## 2024-08-06 MED ORDER — LIDOCAINE HCL 1 % IJ SOLN
20.0000 mL | Freq: Once | INTRAMUSCULAR | Status: AC
  Administered 2024-08-06: 10 mL via INTRADERMAL

## 2024-08-06 MED ORDER — LIDOCAINE-EPINEPHRINE 1 %-1:100000 IJ SOLN
INTRAMUSCULAR | Status: AC
  Filled 2024-08-06: qty 20

## 2024-08-06 MED ORDER — IOHEXOL 300 MG/ML  SOLN
50.0000 mL | Freq: Once | INTRAMUSCULAR | Status: AC | PRN
  Administered 2024-08-06: 20 mL

## 2024-08-10 ENCOUNTER — Other Ambulatory Visit (HOSPITAL_COMMUNITY)

## 2024-10-15 ENCOUNTER — Other Ambulatory Visit (HOSPITAL_COMMUNITY)

## 2025-02-16 ENCOUNTER — Ambulatory Visit: Admitting: Urology
